# Patient Record
Sex: Male | Born: 1970
Health system: Southern US, Community
[De-identification: ages and names within clinical notes are randomized; demographics above are authoritative.]

## PROBLEM LIST (undated history)

## (undated) ENCOUNTER — Emergency Department (HOSPITAL_COMMUNITY): Discharge status: Absent without leave | Payer: 59

## (undated) DIAGNOSIS — A159 Respiratory tuberculosis unspecified: Secondary | ICD-10-CM

## (undated) DIAGNOSIS — F32A Depression, unspecified: Secondary | ICD-10-CM

## (undated) DIAGNOSIS — Z973 Presence of spectacles and contact lenses: Secondary | ICD-10-CM

## (undated) DIAGNOSIS — D649 Anemia, unspecified: Secondary | ICD-10-CM

## (undated) DIAGNOSIS — M869 Osteomyelitis, unspecified: Secondary | ICD-10-CM

## (undated) DIAGNOSIS — Z5189 Encounter for other specified aftercare: Secondary | ICD-10-CM

## (undated) DIAGNOSIS — B9562 Methicillin resistant Staphylococcus aureus infection as the cause of diseases classified elsewhere: Secondary | ICD-10-CM

## (undated) DIAGNOSIS — K219 Gastro-esophageal reflux disease without esophagitis: Secondary | ICD-10-CM

## (undated) DIAGNOSIS — E119 Type 2 diabetes mellitus without complications: Secondary | ICD-10-CM

## (undated) DIAGNOSIS — F1020 Alcohol dependence, uncomplicated: Secondary | ICD-10-CM

## (undated) DIAGNOSIS — I10 Essential (primary) hypertension: Secondary | ICD-10-CM

## (undated) DIAGNOSIS — F329 Major depressive disorder, single episode, unspecified: Secondary | ICD-10-CM

## (undated) DIAGNOSIS — T7840XA Allergy, unspecified, initial encounter: Secondary | ICD-10-CM

## (undated) DIAGNOSIS — M1990 Unspecified osteoarthritis, unspecified site: Secondary | ICD-10-CM

## (undated) DIAGNOSIS — J189 Pneumonia, unspecified organism: Secondary | ICD-10-CM

## (undated) DIAGNOSIS — F419 Anxiety disorder, unspecified: Secondary | ICD-10-CM

## (undated) DIAGNOSIS — R7881 Bacteremia: Secondary | ICD-10-CM

## (undated) DIAGNOSIS — J45909 Unspecified asthma, uncomplicated: Secondary | ICD-10-CM

## (undated) DIAGNOSIS — J449 Chronic obstructive pulmonary disease, unspecified: Secondary | ICD-10-CM

## (undated) DIAGNOSIS — Z8719 Personal history of other diseases of the digestive system: Secondary | ICD-10-CM

## (undated) DIAGNOSIS — K859 Acute pancreatitis without necrosis or infection, unspecified: Secondary | ICD-10-CM

## (undated) DIAGNOSIS — G709 Myoneural disorder, unspecified: Secondary | ICD-10-CM

## (undated) DIAGNOSIS — F1910 Other psychoactive substance abuse, uncomplicated: Secondary | ICD-10-CM

## (undated) HISTORY — DX: Anemia, unspecified: D64.9

## (undated) HISTORY — DX: Anxiety disorder, unspecified: F41.9

## (undated) HISTORY — DX: Acute pancreatitis without necrosis or infection, unspecified: K85.90

## (undated) HISTORY — PX: COLONOSCOPY: SHX174

## (undated) HISTORY — PX: NASAL SEPTUM SURGERY: SHX37

## (undated) HISTORY — PX: HERNIA REPAIR: SHX51

## (undated) HISTORY — DX: Allergy, unspecified, initial encounter: T78.40XA

## (undated) HISTORY — PX: TOTAL KNEE ARTHROPLASTY: SHX125

## (undated) HISTORY — DX: Alcohol dependence, uncomplicated: F10.20

## (undated) HISTORY — PX: PANCREATIC PSEUDOCYST DRAINAGE: SHX2158

## (undated) HISTORY — DX: Pneumonia, unspecified organism: J18.9

## (undated) HISTORY — DX: Bacteremia: R78.81

## (undated) HISTORY — DX: Encounter for other specified aftercare: Z51.89

## (undated) HISTORY — PX: APPENDECTOMY: SHX54

## (undated) HISTORY — DX: Major depressive disorder, single episode, unspecified: F32.9

## (undated) HISTORY — DX: Chronic obstructive pulmonary disease, unspecified: J44.9

## (undated) HISTORY — DX: Unspecified asthma, uncomplicated: J45.909

## (undated) HISTORY — DX: Methicillin resistant Staphylococcus aureus infection as the cause of diseases classified elsewhere: B95.62

## (undated) HISTORY — DX: Myoneural disorder, unspecified: G70.9

## (undated) HISTORY — PX: ESOPHAGUS SURGERY: SHX626

## (undated) HISTORY — PX: OTHER SURGICAL HISTORY: SHX169

## (undated) HISTORY — DX: Depression, unspecified: F32.A

## (undated) HISTORY — DX: Essential (primary) hypertension: I10

## (undated) HISTORY — DX: Respiratory tuberculosis unspecified: A15.9

## (undated) HISTORY — DX: Other psychoactive substance abuse, uncomplicated: F19.10

## (undated) HISTORY — DX: Type 2 diabetes mellitus without complications: E11.9

---

## 2004-09-14 HISTORY — PX: CHOLECYSTECTOMY: SHX55

## 2004-09-14 HISTORY — PX: SPLENECTOMY: SUR1306

## 2013-11-23 DIAGNOSIS — N499 Inflammatory disorder of unspecified male genital organ: Secondary | ICD-10-CM | POA: Insufficient documentation

## 2014-02-16 DIAGNOSIS — I10 Essential (primary) hypertension: Secondary | ICD-10-CM | POA: Insufficient documentation

## 2014-02-16 DIAGNOSIS — J45909 Unspecified asthma, uncomplicated: Secondary | ICD-10-CM | POA: Insufficient documentation

## 2014-08-14 DIAGNOSIS — IMO0002 Reserved for concepts with insufficient information to code with codable children: Secondary | ICD-10-CM | POA: Insufficient documentation

## 2014-08-14 DIAGNOSIS — E1165 Type 2 diabetes mellitus with hyperglycemia: Secondary | ICD-10-CM | POA: Insufficient documentation

## 2015-05-23 DIAGNOSIS — N529 Male erectile dysfunction, unspecified: Secondary | ICD-10-CM | POA: Insufficient documentation

## 2015-08-05 DIAGNOSIS — Z794 Long term (current) use of insulin: Secondary | ICD-10-CM | POA: Insufficient documentation

## 2015-12-30 DIAGNOSIS — N400 Enlarged prostate without lower urinary tract symptoms: Secondary | ICD-10-CM | POA: Insufficient documentation

## 2015-12-30 DIAGNOSIS — F419 Anxiety disorder, unspecified: Secondary | ICD-10-CM | POA: Insufficient documentation

## 2015-12-30 DIAGNOSIS — G2581 Restless legs syndrome: Secondary | ICD-10-CM | POA: Insufficient documentation

## 2015-12-30 DIAGNOSIS — N493 Fournier gangrene: Secondary | ICD-10-CM | POA: Insufficient documentation

## 2015-12-30 DIAGNOSIS — E1140 Type 2 diabetes mellitus with diabetic neuropathy, unspecified: Secondary | ICD-10-CM | POA: Insufficient documentation

## 2015-12-30 DIAGNOSIS — J449 Chronic obstructive pulmonary disease, unspecified: Secondary | ICD-10-CM | POA: Insufficient documentation

## 2017-12-21 DIAGNOSIS — J449 Chronic obstructive pulmonary disease, unspecified: Secondary | ICD-10-CM | POA: Diagnosis not present

## 2017-12-21 DIAGNOSIS — E1165 Type 2 diabetes mellitus with hyperglycemia: Secondary | ICD-10-CM | POA: Diagnosis not present

## 2017-12-21 DIAGNOSIS — Z794 Long term (current) use of insulin: Secondary | ICD-10-CM | POA: Diagnosis not present

## 2017-12-21 DIAGNOSIS — K861 Other chronic pancreatitis: Secondary | ICD-10-CM | POA: Diagnosis not present

## 2018-01-27 MED FILL — LEVEMIR FLEXTOUCH 100 UNITS: 100 | 30 days supply | Qty: 18 | Fill #0

## 2018-01-27 MED FILL — VENTOLIN HFA 90 MCG INHALER: 108 (90 BAS | 50 days supply | Qty: 18 | Fill #0

## 2018-01-27 MED FILL — traZODone HCL 100 MG TABS: 100 | 30 days supply | Qty: 30 | Fill #0

## 2018-01-27 MED FILL — MONTELUKAST SOD 10 MG TAB: 10 | 30 days supply | Qty: 30 | Fill #0

## 2018-01-27 MED FILL — GABAPENTIN 300 MG CAPSULE: 300 | 90 days supply | Qty: 90 | Fill #0

## 2018-01-27 MED FILL — ADVAIR 250/50 DISKUS: 250-50 | 30 days supply | Qty: 60 | Fill #0

## 2018-01-27 MED FILL — METFORMIN HCL ER 500 MG TAB: 500 | 90 days supply | Qty: 180 | Fill #0

## 2018-02-24 ENCOUNTER — Other Ambulatory Visit: Payer: Self-pay

## 2018-02-24 ENCOUNTER — Ambulatory Visit (INDEPENDENT_AMBULATORY_CARE_PROVIDER_SITE_OTHER): Payer: 59 | Admitting: Family Medicine

## 2018-02-24 ENCOUNTER — Encounter: Payer: Self-pay | Admitting: Family Medicine

## 2018-02-24 VITALS — BP 154/88 | HR 65 | Temp 98.3°F | Wt 283.8 lb

## 2018-02-24 DIAGNOSIS — E1165 Type 2 diabetes mellitus with hyperglycemia: Secondary | ICD-10-CM

## 2018-02-24 DIAGNOSIS — J4520 Mild intermittent asthma, uncomplicated: Secondary | ICD-10-CM | POA: Diagnosis not present

## 2018-02-24 DIAGNOSIS — Z794 Long term (current) use of insulin: Secondary | ICD-10-CM | POA: Diagnosis not present

## 2018-02-24 DIAGNOSIS — D649 Anemia, unspecified: Secondary | ICD-10-CM | POA: Diagnosis not present

## 2018-02-24 DIAGNOSIS — F419 Anxiety disorder, unspecified: Secondary | ICD-10-CM

## 2018-02-24 DIAGNOSIS — F418 Other specified anxiety disorders: Secondary | ICD-10-CM | POA: Insufficient documentation

## 2018-02-24 DIAGNOSIS — F1020 Alcohol dependence, uncomplicated: Secondary | ICD-10-CM | POA: Diagnosis not present

## 2018-02-24 DIAGNOSIS — F17200 Nicotine dependence, unspecified, uncomplicated: Secondary | ICD-10-CM | POA: Diagnosis not present

## 2018-02-24 DIAGNOSIS — K2100 Gastro-esophageal reflux disease with esophagitis, without bleeding: Secondary | ICD-10-CM

## 2018-02-24 DIAGNOSIS — K219 Gastro-esophageal reflux disease without esophagitis: Secondary | ICD-10-CM | POA: Insufficient documentation

## 2018-02-24 DIAGNOSIS — E1140 Type 2 diabetes mellitus with diabetic neuropathy, unspecified: Secondary | ICD-10-CM | POA: Insufficient documentation

## 2018-02-24 DIAGNOSIS — F32A Depression, unspecified: Secondary | ICD-10-CM

## 2018-02-24 DIAGNOSIS — F329 Major depressive disorder, single episode, unspecified: Secondary | ICD-10-CM | POA: Diagnosis not present

## 2018-02-24 MED ORDER — INSULIN DETEMIR 100 UNIT/ML ~~LOC~~ SOLN: 60.0000 [IU] | Freq: Every day | SUBCUTANEOUS | 11 refills | Status: DC

## 2018-02-24 MED ORDER — TRAZODONE HCL 50 MG PO TABS: 100.0000 mg | ORAL_TABLET | Freq: Every evening | ORAL | 3 refills | Status: DC | PRN

## 2018-02-24 MED ORDER — MONTELUKAST SODIUM 10 MG PO TABS: 10.0000 mg | ORAL_TABLET | Freq: Every day | ORAL | 3 refills | Status: DC

## 2018-02-24 MED ORDER — FLUTICASONE-SALMETEROL 250-50 MCG/DOSE IN AEPB: 1.0000 | INHALATION_SPRAY | Freq: Two times a day (BID) | RESPIRATORY_TRACT | 3 refills | Status: DC

## 2018-02-24 MED ORDER — METFORMIN HCL 1000 MG PO TABS: 1000.0000 mg | ORAL_TABLET | Freq: Two times a day (BID) | ORAL | 3 refills | Status: DC

## 2018-02-24 MED ORDER — HUMALOG JUNIOR KWIKPEN 100 UNIT/ML ~~LOC~~ SOPN: 5.0000 [IU] | PEN_INJECTOR | Freq: Three times a day (TID) | SUBCUTANEOUS | 3 refills | Status: DC

## 2018-02-24 MED ORDER — GABAPENTIN 300 MG PO CAPS: 300.0000 mg | ORAL_CAPSULE | Freq: Every day | ORAL | 3 refills | Status: DC

## 2018-02-24 MED FILL — MONTELUKAST SOD 10 MG TAB: 10 | 90 days supply | Qty: 90 | Fill #0

## 2018-02-24 MED FILL — traZODone HCL 50 MG TABS: 50 | 90 days supply | Qty: 180 | Fill #0

## 2018-02-24 MED FILL — ADVAIR 250/50 DISKUS: 250-50 | 90 days supply | Qty: 180 | Fill #0

## 2018-02-24 MED FILL — metFORMIN HCL 1000 MG TABS: 1000 | 90 days supply | Qty: 180 | Fill #0

## 2018-02-24 NOTE — Patient Instructions (Signed)
Thank you for coming in to see [place] today. Please see below to review our plan for today's visit.  1.  I will call you if your lab results are abnormal, otherwise expect to receive results in the mail.  Her medications will be called in and should be available to be picked up from the pharmacy because you. 2.  It is important that you stop drinking as this will worsen your pancreas and could lead to other complications.  We will discuss smoking cessation at a future visit.  You can call 1 800 quit now in the meantime for free resources if you are interested. 3.  I would like to see you again in about 1 month.  Please call the clinic at [phone] if your symptoms worsen or you have any concerns. It was our pleasure to serve you.  Harriet Butte, [place], PGY-2

## 2018-02-24 NOTE — Assessment & Plan Note (Signed)
Chronic.  Currently drinking beer.  No history of DTs.  - Discussed importance of remaining sober given history

## 2018-02-24 NOTE — Progress Notes (Signed)
Subjective   Patient ID: Christopher Fernandez    DOB: Mar 29, 1971, 47 y.o. male   MRN: [id number]  CC: "Establish care"  HPI: Christopher Fernandez is a 47 y.o. male who presents to clinic today for the following:  Diabetes: Patient with long-standing uncontrolled diabetes.  Uncertain what his last A1c was.  Has been on insulin for many years.  Has been taking his Levemir sliding scale along with his Humalog.  Patient does not exercise but tries to avoid excessive carbohydrate intake.  Smoker: Current everyday smoker.  Smokes approximately 1 pack/week.  Has attempted once to quit cold [place] unsuccessfully.  Interested in cessation in the near future.  Denies fevers or chills, cough, shortness of breath, chest pain, unexplained weight loss.  EtOH use disorder: Has extensive history of pancreatic issues with diabetes secondary to alcohol use.  Patient states he currently drinks 5-6 beers every few days.  He denies any history of DTs or need for hospitalization for alcohol withdrawal.  He is not taking any benzodiazepines.  Asthma: Patient currently taking Ventolin along with his Advair Diskus twice daily.  He does not use his insulin as a rescue inhaler.  He denies symptoms of cough, shortness of breath, increased sputum production.  ROS: see HPI for pertinent.  [place]: DM, GERD, asthma, EtOH use disorder, anxiety depression, anemia.  Surgical history pancreatic pseudocyst drainage, splenectomy, cholecystectomy, hernia repair x2, amputation of right great toe, R-TKA x2.  Family history DM, HTN, HLD, CKD, thyroid, EtOH abuse, sickle cell trait, asthma.  Smoking status reviewed. Medications reviewed.  Objective   BP (!) 154/88   Pulse 65   Temp 98.3 F (36.8 C) (Oral)   Wt 283 lb 12.8 oz (128.7 kg)   SpO2 98%  Vitals and nursing note reviewed.  General: well nourished, well developed, NAD with non-toxic appearance HEENT: normocephalic, atraumatic, moist mucous membranes Neck: supple, non-tender without  lymphadenopathy Cardiovascular: regular rate and rhythm without murmurs, rubs, or gallops Lungs: clear to auscultation bilaterally with normal work of breathing Abdomen: soft, non-tender, non-distended, normoactive bowel sounds Skin: warm, dry, no rashes or lesions, cap refill < 2 seconds Extremities: warm and well perfused, normal tone, no edema  Assessment & Plan   Uncontrolled diabetes mellitus with hyperglycemia, with long-term current use of insulin (HCC) Chronic.  History of uncontrol.  Currently on insulin.  Patient is unfortunately taking his basal insulin sliding scale. - Checking A1c, CBC with differential, CMET, TSH, LDL level - Instructed patient to take Levemir 60 units daily and limit Humalog to 5 units 3 times daily - Discussed lifestyle modification  Tobacco use disorder Chronic.  Current everyday smoker.  History of 1 failed attempt. - Given instructions for 1800 quit now - We will consider trial with Chantix at next visit  GERD (gastroesophageal reflux disease) Chronic.  Likely secondary to chronic alcohol use disorder.  Patient to back today.  No PPI on med history. - Discussed importance of avoidance of alcohol use based on history  Asthma, intermittent Chronic.  Controlled.  Patient seems to be taking his Ventolin as a controller. - Given refill for Advair Diskus twice daily and Ventolin as needed - Discussed importance of smoking cessation  Alcohol use disorder, severe, dependence (HCC) Chronic.  Currently drinking beer.  No history of DTs.  - Discussed importance of remaining sober given history  Orders Placed This Encounter  Procedures  . CBC with Differential/Platelet  . CMP14+EGFR  . TSH  . LDL cholesterol, direct  . HgB A1c  Meds ordered this encounter  Medications  . metFORMIN (GLUCOPHAGE) 1000 MG tablet    Sig: Take 1 tablet (1,000 mg total) by mouth 2 (two) times daily with a meal.    Dispense:  180 tablet    Refill:  3  . montelukast  (SINGULAIR) 10 MG tablet    Sig: Take 1 tablet (10 mg total) by mouth at bedtime.    Dispense:  90 tablet    Refill:  3  . gabapentin (NEURONTIN) 300 MG capsule    Sig: Take 1 capsule (300 mg total) by mouth at bedtime.    Dispense:  90 capsule    Refill:  3  . Fluticasone-Salmeterol (ADVAIR DISKUS) 250-50 MCG/DOSE AEPB    Sig: Inhale 1 puff into the lungs 2 (two) times daily.    Dispense:  180 each    Refill:  3  . traZODone (DESYREL) 50 MG tablet    Sig: Take 2 tablets (100 mg total) by mouth at bedtime as needed for sleep.    Dispense:  180 tablet    Refill:  3  . insulin detemir (LEVEMIR) 100 UNIT/ML injection    Sig: Inject 0.6 mLs (60 Units total) into the skin at bedtime.    Dispense:  10 mL    Refill:  11  . insulin lispro (HUMALOG) 100 UNIT/ML KwikPen Junior    Sig: Inject 0.05 mLs (5 Units total) into the skin 3 (three) times daily.    Dispense:  12 pen    Refill:  [place], [employer], PGY-2 02/24/2018, 5:09 PM

## 2018-02-24 NOTE — Assessment & Plan Note (Signed)
Chronic.  Controlled.  Patient seems to be taking his Ventolin as a controller. - Given refill for Advair Diskus twice daily and Ventolin as needed - Discussed importance of smoking cessation

## 2018-02-24 NOTE — Assessment & Plan Note (Signed)
Chronic.  Current everyday smoker.  History of 1 failed attempt. - Given instructions for 1800 quit now - We will consider trial with Chantix at next visit

## 2018-02-24 NOTE — Assessment & Plan Note (Signed)
Chronic.  Likely secondary to chronic alcohol use disorder.  Patient to back today.  No PPI on med history. - Discussed importance of avoidance of alcohol use based on history

## 2018-02-24 NOTE — Assessment & Plan Note (Signed)
Chronic.  History of uncontrol.  Currently on insulin.  Patient is unfortunately taking his basal insulin sliding scale. - Checking A1c, CBC with differential, CMET, TSH, LDL level - Instructed patient to take Levemir 60 units daily and limit Humalog to 5 units 3 times daily - Discussed lifestyle modification

## 2018-02-25 ENCOUNTER — Encounter: Payer: Self-pay | Admitting: Family Medicine

## 2018-02-25 ENCOUNTER — Telehealth: Payer: Self-pay

## 2018-02-25 LAB — CMP14+EGFR
ALT: 37 IU/L (ref 0–44)
AST: 31 IU/L (ref 0–40)
Albumin/Globulin Ratio: 1.6 (ref 1.2–2.2)
Albumin: 4.3 g/dL (ref 3.5–5.5)
Alkaline Phosphatase: 72 IU/L (ref 39–117)
BUN/Creatinine Ratio: 12 (ref 9–20)
BUN: 11 mg/dL (ref 6–24)
Bilirubin Total: 0.5 mg/dL (ref 0.0–1.2)
CO2: 23 mmol/L (ref 20–29)
Calcium: 9.1 mg/dL (ref 8.7–10.2)
Chloride: 100 mmol/L (ref 96–106)
Creatinine, Ser: 0.9 mg/dL (ref 0.76–1.27)
GFR calc Af Amer: 116 mL/min/{1.73_m2} (ref >59)
GFR calc non Af Amer: 100 mL/min/{1.73_m2} (ref >59)
Globulin, Total: 2.7 g/dL (ref 1.5–4.5)
Glucose: 87 mg/dL (ref 65–99)
Potassium: 4.5 mmol/L (ref 3.5–5.2)
Sodium: 138 mmol/L (ref 134–144)
Total Protein: 7 g/dL (ref 6.0–8.5)

## 2018-02-25 LAB — CBC WITH DIFFERENTIAL/PLATELET
Basophils Absolute: 0 10*3/uL (ref 0.0–0.2)
Basos: 0 %
EOS (ABSOLUTE): 0.1 10*3/uL (ref 0.0–0.4)
Eos: 1 %
Hematocrit: 38.2 % (ref 37.5–51.0)
Hemoglobin: 12.6 g/dL — ABNORMAL LOW (ref 13.0–17.7)
Immature Grans (Abs): 0 10*3/uL (ref 0.0–0.1)
Immature Granulocytes: 0 %
Lymphocytes Absolute: 1.8 10*3/uL (ref 0.7–3.1)
Lymphs: 28 %
MCH: 28.9 pg (ref 26.6–33.0)
MCHC: 33 g/dL (ref 31.5–35.7)
MCV: 88 fL (ref 79–97)
Monocytes Absolute: 0.5 10*3/uL (ref 0.1–0.9)
Monocytes: 8 %
Neutrophils Absolute: 4.2 10*3/uL (ref 1.4–7.0)
Neutrophils: 63 %
Platelets: 275 10*3/uL (ref 150–450)
RBC: 4.36 x10E6/uL (ref 4.14–5.80)
RDW: 13.8 % (ref 12.3–15.4)
WBC: 6.7 10*3/uL (ref 3.4–10.8)

## 2018-02-25 LAB — TSH: TSH: 2.13 u[IU]/mL (ref 0.450–4.500)

## 2018-02-25 LAB — LDL CHOLESTEROL, DIRECT: LDL Direct: 55 mg/dL (ref 0–99)

## 2018-02-25 NOTE — Addendum Note (Signed)
Addended by: [place] Pink on: 02/25/2018 12:21 PM   Modules accepted: Orders

## 2018-02-25 NOTE — Telephone Encounter (Signed)
Received fax from [facility] requesting prior authorization of Humalog Junior KwikPen. Clinical questions placed in PCPs box for completion. Danley Danker, RN [facility] RN)

## 2018-02-28 MED FILL — HUMALOG 100 UNITS/ML KWIKPE: 100 | 80 days supply | Qty: 12 | Fill #0

## 2018-02-28 NOTE — Telephone Encounter (Signed)
Pharmacist from [place] calling asking if this RX should have been for a regular Humalog Kwikpen and not the junior?  Call back I [phone]  Danley Danker, RN [facility])

## 2018-02-28 NOTE — Telephone Encounter (Signed)
Covering inbox for Dr. Yisroel Ramming  It appears a regular Claiborne Rigg is appropriate- junior possibly ordered in error. Called pharmacist back and made change to regular kwikpen 5 units TID.  Lucila Maine, DO PGY-2, [facility] 02/28/2018 4:30 PM

## 2018-02-28 NOTE — Telephone Encounter (Signed)
According to [facility] mailbox he is out of the office this week and Vanetta Shawl is covering. PA put in Riccio's mail box and message routed. Danley Danker, RN [facility] RN)

## 2018-03-01 LAB — SPECIMEN STATUS REPORT

## 2018-03-01 LAB — HEMOGLOBIN A1C
Est. average glucose Bld gHb Est-mCnc: 209 mg/dL
Hgb A1c MFr Bld: 8.9 % — ABNORMAL HIGH (ref 4.8–5.6)

## 2018-03-03 ENCOUNTER — Other Ambulatory Visit: Payer: Self-pay

## 2018-03-03 MED ORDER — INSULIN DETEMIR 100 UNIT/ML FLEXPEN: 60.0000 [IU] | PEN_INJECTOR | Freq: Every day | SUBCUTANEOUS | 11 refills | Status: DC

## 2018-03-23 MED FILL — LEVEMIR FLEXTOUCH 100 UNITS: 100 | 25 days supply | Qty: 15 | Fill #0

## 2018-03-29 ENCOUNTER — Ambulatory Visit (INDEPENDENT_AMBULATORY_CARE_PROVIDER_SITE_OTHER): Payer: 59 | Admitting: Family Medicine

## 2018-03-29 ENCOUNTER — Encounter: Payer: Self-pay | Admitting: Family Medicine

## 2018-03-29 ENCOUNTER — Other Ambulatory Visit: Payer: Self-pay

## 2018-03-29 VITALS — BP 142/98 | HR 74 | Temp 98.6°F | Ht 76.0 in | Wt 284.0 lb

## 2018-03-29 DIAGNOSIS — F17200 Nicotine dependence, unspecified, uncomplicated: Secondary | ICD-10-CM | POA: Diagnosis not present

## 2018-03-29 DIAGNOSIS — J4520 Mild intermittent asthma, uncomplicated: Secondary | ICD-10-CM

## 2018-03-29 DIAGNOSIS — Z794 Long term (current) use of insulin: Secondary | ICD-10-CM

## 2018-03-29 DIAGNOSIS — E1165 Type 2 diabetes mellitus with hyperglycemia: Secondary | ICD-10-CM

## 2018-03-29 DIAGNOSIS — D649 Anemia, unspecified: Secondary | ICD-10-CM

## 2018-03-29 DIAGNOSIS — F1020 Alcohol dependence, uncomplicated: Secondary | ICD-10-CM

## 2018-03-29 DIAGNOSIS — F419 Anxiety disorder, unspecified: Secondary | ICD-10-CM

## 2018-03-29 DIAGNOSIS — F32A Depression, unspecified: Secondary | ICD-10-CM

## 2018-03-29 DIAGNOSIS — F329 Major depressive disorder, single episode, unspecified: Secondary | ICD-10-CM

## 2018-03-29 MED ORDER — FREESTYLE LANCETS MISC: 12 refills | Status: AC

## 2018-03-29 MED ORDER — GLUCOSE BLOOD VI STRP: ORAL_STRIP | 12 refills | Status: DC

## 2018-03-29 MED ORDER — CITALOPRAM HYDROBROMIDE 20 MG PO TABS: 20.0000 mg | ORAL_TABLET | Freq: Every day | ORAL | 3 refills | Status: DC

## 2018-03-29 MED ORDER — FLUTICASONE PROPIONATE (INHAL) 50 MCG/BLIST IN AEPB: 1.0000 | INHALATION_SPRAY | Freq: Two times a day (BID) | RESPIRATORY_TRACT | 12 refills | Status: DC

## 2018-03-29 MED FILL — FREESTYLE LITE TEST STRIP: 30 days supply | Qty: 100 | Fill #0

## 2018-03-29 MED FILL — FREESTYLE LANCETS: 30 days supply | Qty: 100 | Fill #0

## 2018-03-29 MED FILL — CITALOPRAM HBR 20 MG TABLET: 20 | 90 days supply | Qty: 90 | Fill #0

## 2018-03-29 NOTE — Assessment & Plan Note (Addendum)
Chronic.  Remains in relapse.  Patient motivated to remain sober.  Parotid swelling likely due to alcohol use.  No signs of liver failure.  Recent liver function studies WNL.  Patient did endorse history of need for liver transplant but appeared to transplant several years ago.  He does have chronic issues with pancreatitis in the past. - Advised to consider behavioral therapy and will consider pharmacotherapy in the future - Ambulatory referral to GI to evaluate chronic use of pancreas enzymes and long-term history of chronic pancreatitis and evaluation for liver disease

## 2018-03-29 NOTE — Assessment & Plan Note (Signed)
Chronic.  Depression controlled with Celexa.  Does endorse some anxiety.  Offered to increase Celexa but patient would like to wait until next visit. - RTC 1 month - Consider increasing blocks to 30 units daily

## 2018-03-29 NOTE — Assessment & Plan Note (Signed)
Incidental finding.  Likely chronic.  Appears to be normocytic.  No recent history of bleeding.  Patient is not on blood thinners NSAID use.  Will need to rule out GI source.  Could be related to chronic alcohol use disorder. - Ambulatory referral to GI - Checking TIBC and iron, ferritin

## 2018-03-29 NOTE — Progress Notes (Signed)
Subjective   Patient ID: Christopher Fernandez    DOB: April 30, 1971, 47 y.o. male   MRN: [id number]  CC: "Follow-up"  HPI: Christopher Fernandez is a 47 y.o. male who presents to clinic today for the following:  Alcohol use disorder: Patient has a long history of alcohol use disorder.  His preferred drink is mixed drinks such as vodka.  He endorses 2 episodes of binge drinking over the last month, the first with 4 mixed drinks, and the second with 6 mixed drinks.  She states loneliness seems to be his weakness.  He lives at home with his wife who does not drink.  He denies histories of DTs.  He is motivated to become sober and is open to the idea of medical therapy but would like to wait on behavioral therapy.  Depression and anxiety: Patient with history of Tylenol overdose in his early 63s.  No recent episodes of suicidal or homicidal ideation.  He endorses controlled symptoms of depression with use of Celexa 20 daily.  He does occasionally have some episodes of anxiety.  Again, he is not interested in behavioral therapy.  Anemia: Patient found to have hemoglobin of 12.6 one month ago during routine checkup.  He endorses long history of anemia and has required blood transfusions in the past.  He denies hematuria, rectal bleeding, melena or hematochezia.  Patient also denies symptoms of chest pain, shortness of breath, syncope or fatigue.  He does endorse receiving a colonoscopy but cannot recall how long ago.  Asthma: Patient currently on Advair Diskus.  He is requesting a refill of this medication he denies recent episodes of asthma exacerbations, shortness of breath or wheeze.  Diabetes: Patient endorsing adherence to recommendation to take 60 units of Levemir daily along with 5 units of Humalog with each meal.  Diabetes is uncontrolled based on A1c 1 month ago at 8.9.  Patient requesting refills of lancets and testing strips.  Tobacco use disorder: Patient is not interested in cessation today.  He has  contemplated pharmacotherapy for assistance.  He currently smokes 1 pack/week and has tried quitting cold turkey's before without success.  He denies redness of breath, fevers or chills, cough, chest pain, or unexplained weight gain.  ROS: see HPI for pertinent.  [place]: DM, GERD, asthma, EtOH use disorder, anxiety depression, anemia.  Surgical history pancreatic pseudocyst drainage, splenectomy, cholecystectomy, hernia repair x2, amputation of right great toe, R-TKA x2.  Family history DM, HTN, HLD, CKD, thyroid, EtOH abuse, sickle cell trait, asthma.  Smoking status reviewed. Medications reviewed.  Objective   BP (!) 142/98   Pulse 74   Temp 98.6 F (37 C) (Oral)   Ht 6\' 4"  (1.93 m)   Wt 284 lb (128.8 kg)   SpO2 98%   BMI 34.57 kg/m  Vitals and nursing note reviewed.  General: well nourished, well developed, NAD with non-toxic appearance HEENT: normocephalic, atraumatic, moist mucous membranes, mild edema at proximal jaw bilaterally without tenderness Neck: supple, non-tender without lymphadenopathy Cardiovascular: regular rate and rhythm without murmurs, rubs, or gallops Lungs: clear to auscultation bilaterally with normal work of breathing Abdomen: soft, non-tender, non-distended, normoactive bowel sounds Skin: warm, dry, no rashes or lesions, cap refill < 2 seconds Extremities: warm and well perfused, normal tone, no edema Psych: euthymic mood, congruent affect  Assessment & Plan   Asthma, intermittent Chronic.  Controlled on Advair Diskus.  Current everyday smoker.  No signs of acute exacerbation today. - Advised smoking cessation - Given refill for Advair  Diskus, 1 puff twice daily  Alcohol use disorder, severe, dependence (HCC) Chronic.  Remains in relapse.  Patient motivated to remain sober.  Parotid swelling likely due to alcohol use.  No signs of liver failure.  Recent liver function studies WNL.  Patient did endorse history of need for liver transplant but appeared to  transplant several years ago.  He does have chronic issues with pancreatitis in the past. - Advised to consider behavioral therapy and will consider pharmacotherapy in the future - Ambulatory referral to GI to evaluate chronic use of pancreas enzymes and long-term history of chronic pancreatitis and evaluation for liver disease  Uncontrolled diabetes mellitus with hyperglycemia, with long-term current use of insulin (HCC) Chronic.  Uncontrolled.  Currently on insulin. - Continue Levemir 60 units daily and Humalog 5 units with each meal - Given refill for lancets and testing strips - Next A1c due 05/27/2018  Tobacco use disorder Chronic.  Current everyday smoker.  Patient not a good candidate for cessation today.  Appears to and contemplating phase. - Discussed importance of smoking cessation  Anemia Incidental finding.  Likely chronic.  Appears to be normocytic.  No recent history of bleeding.  Patient is not on blood thinners NSAID use.  Will need to rule out GI source.  Could be related to chronic alcohol use disorder. - Ambulatory referral to GI - Checking TIBC and iron, ferritin  Anxiety and depression Chronic.  Depression controlled with Celexa.  Does endorse some anxiety.  Offered to increase Celexa but patient would like to wait until next visit. - RTC 1 month - Consider increasing blocks to 30 units daily  Orders Placed This Encounter  Procedures  . Iron and TIBC  . Ferritin  . Ambulatory referral to Gastroenterology    Referral Priority:   Routine    Referral Type:   Consultation    Referral Reason:   Specialty Services Required    Number of Visits Requested:   1   Meds ordered this encounter  Medications  . fluticasone (FLOVENT DISKUS) 50 MCG/BLIST diskus inhaler    Sig: Inhale 1 puff into the lungs 2 (two) times daily.    Dispense:  1 Inhaler    Refill:  12  . citalopram (CELEXA) 20 MG tablet    Sig: Take 1 tablet (20 mg total) by mouth daily.    Dispense:  90  tablet    Refill:  3  . Lancets (FREESTYLE) lancets    Sig: Use as instructed    Dispense:  100 each    Refill:  12  . glucose blood (FREESTYLE LITE) test strip    Sig: Use as instructed    Dispense:  100 each    Refill:  12    Harriet Butte, DO [place], PGY-3 03/29/2018, 11:53 AM

## 2018-03-29 NOTE — Patient Instructions (Addendum)
Thank you for coming in to see [place] today. Please see below to review our plan for today's visit.  1.  I have placed a referral for your Flovent regarding your asthma.  Take as prescribed. 2.  Continue taking the Levemir 60 units daily and Humalog 5 units with each meal.  I have provided you lancets and testing strips.  Be sure to check your sugar prior to breakfast and 2 hours after meals and record these and bring them to your next diabetes appointment in 2 months. 3.  I have placed a referral for you to see a gastroenterologist to follow-up with your Creon and other pancreatic issues.  Expect to receive a phone call in the next couple of weeks to schedule the appointment. 4.  We will discuss your iron studies at your next visit in approximately 1 month.  Please call the clinic at [phone] if your symptoms worsen or you have any concerns. It was our pleasure to serve you.  Harriet Butte, [place], PGY-3

## 2018-03-29 NOTE — Assessment & Plan Note (Signed)
Chronic.  Current everyday smoker.  Patient not a good candidate for cessation today.  Appears to and contemplating phase. - Discussed importance of smoking cessation

## 2018-03-29 NOTE — Assessment & Plan Note (Signed)
Chronic.  Controlled on Advair Diskus.  Current everyday smoker.  No signs of acute exacerbation today. - Advised smoking cessation - Given refill for Advair Diskus, 1 puff twice daily

## 2018-03-29 NOTE — Assessment & Plan Note (Signed)
Chronic.  Uncontrolled.  Currently on insulin. - Continue Levemir 60 units daily and Humalog 5 units with each meal - Given refill for lancets and testing strips - Next A1c due 05/27/2018

## 2018-03-30 ENCOUNTER — Encounter: Payer: Self-pay | Admitting: Family Medicine

## 2018-03-30 LAB — IRON AND TIBC
Iron Saturation: 24 % (ref 15–55)
Iron: 90 ug/dL (ref 38–169)
Total Iron Binding Capacity: 378 ug/dL (ref 250–450)
UIBC: 288 ug/dL (ref 111–343)

## 2018-03-30 LAB — FERRITIN: Ferritin: 125 ng/mL (ref 30–400)

## 2018-03-31 ENCOUNTER — Encounter: Payer: Self-pay | Admitting: Gastroenterology

## 2018-05-02 NOTE — Progress Notes (Signed)
Subjective   Patient ID: Christopher Fernandez    DOB: [birth year], 47 y.o. male   MRN: [id number]  CC: "depression follow-up"  HPI: Christopher Fernandez is a 47 y.o. male who presents to clinic today for the following:  Depression with anxiety: Patient here today for follow-up long-standing depression and anxiety.  He does have a history of Tylenol overdose in his early 78s without suicidal or homicidal ideation over the last couple years.  He does report improvement of symptoms with chronic use of Celexa 20 mg daily with occasional symptoms of anxiety.  Patient reports taking Celexa for approximately 12 years.  He has a history of using Prozac and Paxil but was not adherent.  His anxiety tends to occur more frequently in the evening and occurs on average 2 days/week.  Patient denies symptoms of shortness of breath, chest pain, palpitations, diaphoresis, sensation of impending doom.  He feels that his anxiety asked him just as bad as his depression.  He does report increased stress over the last few weeks regarding the death of 2 family members.  He is not interested in seeing behavioral therapy at present.  He is moving along with his wife to the [place] area from [place] in the next few weeks and plans on establishing at a church.  Smoking: Patient is a current everyday smoker with approximately 1 packs/day.  He has a history of several times to quit.  He has no interest in quitting today.  Denies fevers or chills, cough, unexplained weight loss, shortness of breath, chest pain.  Left knee pain: Onset 1 year ago affecting lateral surface of left knee.  Patient denies history of trauma.  He has no erythema or swelling on the left knee.  He has tried Tylenol and ibuprofen without much relief.  Pain occurs throughout the day and there is nothing that alleviates his symptoms.  He is interested in getting x-ray today.  ROS: see HPI for pertinent.  Christopher Fernandez: DM, GERD, asthma, EtOH use disorder, anxiety depression,  anemia.  Surgical history pancreatic pseudocyst drainage, splenectomy, cholecystectomy, hernia repair x2, amputation of right great toe, R-TKA x2.  Family history DM, HTN, HLD, CKD, thyroid, EtOH abuse, sickle cell trait, asthma.  Smoking status reviewed. Medications reviewed.  Objective   BP 134/82   Pulse 75   Temp 98.6 F (37 C) (Oral)   Ht 6\' 4"  (1.93 m)   Wt 283 lb 6.4 oz (128.5 kg)   SpO2 99%   BMI 34.50 kg/m  Vitals and nursing note reviewed.  General: well nourished, well developed, NAD with non-toxic appearance HEENT: normocephalic, atraumatic, moist mucous membranes Cardiovascular: regular rate and rhythm without murmurs, rubs, or gallops Lungs: clear to auscultation bilaterally with normal work of breathing Skin: warm, dry, no rashes or lesions, cap refill < 2 seconds Extremities: warm and well perfused, normal tone, no edema, 5/5 motor strength on lower extremities bilaterally, negative McMurray's and negative Lachman test, negative valgus and varus stress test, normal gait, sensation intact lower extremities bilaterally Psych: euthymic mood, congruent affect  Assessment & Plan   Anxiety associated with depression Chronic.  PHQ-9 score 10, somewhat difficult.  Gad-7 score 7, somewhat difficult.  Has history of MDD well-controlled with Celexa.  Does have weekly anxious symptoms.  Good support system.  Patient does not appear to be threat to self or others.   - Increase Celexa to 30 mg daily - Follow-up in 2 months  Chronic pain of left knee Chronic.  No signs of  meniscal or ligamentous injury.  Patient without history of trauma.  Suspicious for arthritic changes. - Standing AP knee bilateral radiographs  Tobacco use disorder Chronic.  Current everyday smoker. Poor candidate for cessation today. - Discussed importance of smoking cessation  Orders Placed This Encounter  Procedures  . DG Knee Bilateral Standing AP    Standing Status:   Future    Standing Expiration  Date:   10/30/2018    Order Specific Question:   Reason for Exam (SYMPTOM  OR DIAGNOSIS REQUIRED)    Answer:   left knee pain    Order Specific Question:   Preferred imaging location?    Answer:   [facility]    Order Specific Question:   Call Results- Best Contact Number?    Answer:   [phone]   No orders of the defined types were placed in this encounter.   Harriet Butte, [place], PGY-3 05/03/2018, 9:12 PM

## 2018-05-03 ENCOUNTER — Other Ambulatory Visit: Payer: Self-pay

## 2018-05-03 ENCOUNTER — Encounter: Payer: Self-pay | Admitting: Family Medicine

## 2018-05-03 ENCOUNTER — Ambulatory Visit (INDEPENDENT_AMBULATORY_CARE_PROVIDER_SITE_OTHER): Payer: 59 | Admitting: Family Medicine

## 2018-05-03 VITALS — BP 134/82 | HR 75 | Temp 98.6°F | Ht 76.0 in | Wt 283.4 lb

## 2018-05-03 DIAGNOSIS — M25562 Pain in left knee: Secondary | ICD-10-CM

## 2018-05-03 DIAGNOSIS — F418 Other specified anxiety disorders: Secondary | ICD-10-CM | POA: Diagnosis not present

## 2018-05-03 DIAGNOSIS — F17200 Nicotine dependence, unspecified, uncomplicated: Secondary | ICD-10-CM | POA: Diagnosis not present

## 2018-05-03 DIAGNOSIS — G8929 Other chronic pain: Secondary | ICD-10-CM | POA: Insufficient documentation

## 2018-05-03 MED FILL — FREESTYLE LITE TEST STRIP: 30 days supply | Qty: 100 | Fill #1

## 2018-05-03 MED FILL — FREESTYLE LANCETS: 30 days supply | Qty: 100 | Fill #1

## 2018-05-03 MED FILL — GABAPENTIN 300 MG CAPSULE: 300 | 60 days supply | Qty: 60 | Fill #1

## 2018-05-03 MED FILL — LEVEMIR FLEXTOUCH 100 UNITS: 100 | 25 days supply | Qty: 15 | Fill #1

## 2018-05-03 NOTE — Assessment & Plan Note (Signed)
Chronic.  Current everyday smoker. Poor candidate for cessation today. - Discussed importance of smoking cessation

## 2018-05-03 NOTE — Assessment & Plan Note (Signed)
Chronic.  No signs of meniscal or ligamentous injury.  Patient without history of trauma.  Suspicious for arthritic changes. - Standing AP knee bilateral radiographs

## 2018-05-03 NOTE — Patient Instructions (Signed)
Thank you for coming in to see [place] today. Please see below to review our plan for today's visit.  1.  Increase her Celexa to 30 mg daily.  I think it would be helpful for you and your wife to get involved in a church when you relocate in the next few weeks.  Having a good support group is very important. 2.  Please let me know when you are ready to stop smoking. 3.  As for your knee, we will check an x-ray to see if there are any signs of arthritic changes.  You can take ibuprofen 800 mg every 8 hours as needed for pain.  It is also important that you are exercising regularly.  I would aim to do moderate intensity exercise a few times throughout the week.  Try doing this along with your wife to formulate habits. 4.  I will see you again in late September or October to check on your diabetes.  Please call the clinic at [phone] if your symptoms worsen or you have any concerns. It was our pleasure to serve you.  Harriet Butte, [place], PGY-3

## 2018-05-03 NOTE — Assessment & Plan Note (Signed)
Chronic.  PHQ-9 score 10, somewhat difficult.  Gad-7 score 7, somewhat difficult.  Has history of MDD well-controlled with Celexa.  Does have weekly anxious symptoms.  Good support system.  Patient does not appear to be threat to self or others.   - Increase Celexa to 30 mg daily - Follow-up in 2 months

## 2018-05-20 ENCOUNTER — Ambulatory Visit (HOSPITAL_COMMUNITY)
Admission: RE | Admit: 2018-05-20 | Discharge: 2018-05-20 | Disposition: A | Discharge status: Home / Self Care | Payer: 59 | Source: Ambulatory Visit | Attending: Family Medicine | Admitting: Family Medicine

## 2018-05-20 DIAGNOSIS — M170 Bilateral primary osteoarthritis of knee: Secondary | ICD-10-CM | POA: Diagnosis not present

## 2018-05-20 DIAGNOSIS — M1712 Unilateral primary osteoarthritis, left knee: Secondary | ICD-10-CM | POA: Diagnosis not present

## 2018-05-20 DIAGNOSIS — M25562 Pain in left knee: Secondary | ICD-10-CM | POA: Insufficient documentation

## 2018-05-20 DIAGNOSIS — G8929 Other chronic pain: Secondary | ICD-10-CM | POA: Diagnosis not present

## 2018-05-23 ENCOUNTER — Encounter: Payer: Self-pay | Admitting: Family Medicine

## 2018-05-26 ENCOUNTER — Ambulatory Visit (INDEPENDENT_AMBULATORY_CARE_PROVIDER_SITE_OTHER): Payer: 59 | Admitting: Gastroenterology

## 2018-05-26 ENCOUNTER — Encounter: Payer: Self-pay | Admitting: Gastroenterology

## 2018-05-26 VITALS — BP 110/78 | HR 104 | Ht 73.75 in | Wt 281.2 lb

## 2018-05-26 DIAGNOSIS — K529 Noninfective gastroenteritis and colitis, unspecified: Secondary | ICD-10-CM | POA: Diagnosis not present

## 2018-05-26 DIAGNOSIS — Z794 Long term (current) use of insulin: Secondary | ICD-10-CM

## 2018-05-26 DIAGNOSIS — E119 Type 2 diabetes mellitus without complications: Secondary | ICD-10-CM

## 2018-05-26 DIAGNOSIS — F1020 Alcohol dependence, uncomplicated: Secondary | ICD-10-CM | POA: Diagnosis not present

## 2018-05-26 DIAGNOSIS — K8681 Exocrine pancreatic insufficiency: Secondary | ICD-10-CM

## 2018-05-26 MED ORDER — PANCRELIPASE (LIP-PROT-AMYL) 36000-114000 UNITS PO CPEP: 72000.0000 [IU] | ORAL_CAPSULE | Freq: Three times a day (TID) | ORAL | 1 refills | Status: DC

## 2018-05-26 MED FILL — traZODone HCL 50 MG TABS: 50 | 90 days supply | Qty: 180 | Fill #1

## 2018-05-26 MED FILL — CREON DR 36,000 UNITS CAP: 36000 | 30 days supply | Qty: 210 | Fill #0

## 2018-05-26 NOTE — Patient Instructions (Signed)
If you are age 48 or older, your body mass index should be between 23-30. Your Body mass index is 36.36 kg/m. If this is out of the aforementioned range listed, please consider follow up with your Primary Care Provider.  If you are age 1 or younger, your body mass index should be between 19-25. Your Body mass index is 36.36 kg/m. If this is out of the aformentioned range listed, please consider follow up with your Primary Care Provider.   Follow up in 6 weeks.   It was a pleasure to see you today!  Dr. Loletha Carrow

## 2018-05-26 NOTE — Progress Notes (Signed)
[facility] Consult Note:  History: Christopher Fernandez 05/26/2018  Referring physician: [place] Bing, DO  Reason for consult/chief complaint: Pancreatitis and Diarrhea (not a solid BM in a year)   Subjective  HPI:  This is a pleasant 47 year old man who recently moved to the area and has a history of chronic pancreatitis with ongoing alcohol abuse. Primary care note from 03/29/2018 reviewed, outlining patient's history of binge alcohol use, tobacco use, history of depression and suicide attempt in his 36s as well as insulin requiring diabetes.  Patient reports having been on Creon in the past, recalls the dose is being 15,000 unit capsules.  He was not getting much improvement, so he was told to take 2 capsules.  Even then, he continued to have loose and oily stools several times a day, mostly in the early morning.  He denies abdominal pain, nausea, vomiting, anorexia or weight loss. He continues to drink regularly, but less so over the last few months. He reports a history of pancreatic pseudocyst surgery, cholecystectomy, splenectomy.  None of those records are available today, having been done in [place] , [place].  ROS:  Review of Systems  Constitutional: Negative for appetite change and unexpected weight change.  HENT: Negative for mouth sores and voice change.   Eyes: Negative for pain and redness.  Respiratory: Negative for cough and shortness of breath.   Cardiovascular: Negative for chest pain and palpitations.  Genitourinary: Negative for dysuria and hematuria.  Musculoskeletal: Negative for arthralgias and myalgias.  Skin: Negative for pallor and rash.  Allergic/Immunologic: Positive for environmental allergies.  Neurological: Negative for weakness and headaches.       Sleep disturbance  Hematological: Negative for adenopathy.  Psychiatric/Behavioral: Positive for dysphoric mood.     Past Medical History: Past Medical History:    Diagnosis Date  . Alcoholism ([place])   . Anemia   . Anxiety   . Asthma   . COPD (chronic obstructive pulmonary disease) ([place])   . Depression   . Diabetes ([place])   . HTN (hypertension)   . Pancreatitis   . Pneumonia      Past Surgical History: Past Surgical History:  Procedure Laterality Date  . 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 APPENDECTOMY    . CHOLECYSTECTOMY  2006   with gallbladder and spleen  . HERNIA REPAIR  2008, 2010   hiatal hernia and 1 additional  . PANCREATIC PSEUDOCYST DRAINAGE    . SPLENECTOMY  2006  . TOTAL KNEE ARTHROPLASTY Right 1982, 1984     Family History: Family History  Problem Relation Age of Onset  . Diabetes Mother   . Hypertension Mother   . Hyperlipidemia Mother   . Kidney disease Mother   . Thyroid disease Mother   . Breast cancer Mother        mets  . Lung cancer Mother   . Diabetes Father   . Alcohol abuse Sister   . Sickle cell trait Sister   . Diabetes Brother   . Asthma Brother   . Lung cancer Maternal Grandmother   . Kidney disease Maternal Grandmother   . Liver cancer Maternal Uncle        x 4-5  . Kidney disease Maternal Uncle   . Kidney disease Paternal Uncle     Social History: Social History   Socioeconomic History  . Marital status: Married    Spouse name: Not on file  . Number of children: 1  .  Years of education: Not on file  . Highest education level: Not on file  Occupational History  . Not on file  Social Needs  . Financial resource strain: Not on file  . Food insecurity:    Worry: Not on file    Inability: Not on file  . Transportation needs:    Medical: Not on file    Non-medical: Not on file  Tobacco Use  . Smoking status: Current Every Day Smoker    Packs/day: 0.50  . Smokeless tobacco: Never Used  Substance and Sexual Activity  . Alcohol use: Yes    Comment: 2-3 per day  . Drug use: Not on file  . Sexual activity: Not on file  Lifestyle  . Physical activity:    Days  per week: Not on file    Minutes per session: Not on file  . Stress: Not on file  Relationships  . Social connections:    Talks on phone: Not on file    Gets together: Not on file    Attends religious service: Not on file    Active member of club or organization: Not on file    Attends meetings of clubs or organizations: Not on file    Relationship status: Not on file  Other Topics Concern  . Not on file  Social History Narrative  . Not on file    Allergies: Allergies  Allergen Reactions  . Morphine And Related     Cant take because of pancreatitis    Outpatient Meds: Current Outpatient Medications  Medication Sig Dispense Refill  . citalopram (CELEXA) 20 MG tablet Take 1 tablet (20 mg total) by mouth daily. 90 tablet 3  . fluticasone (FLOVENT DISKUS) 50 MCG/BLIST diskus inhaler Inhale 1 puff into the lungs 2 (two) times daily. 1 Inhaler 12  . Fluticasone-Salmeterol (ADVAIR DISKUS) 250-50 MCG/DOSE AEPB Inhale 1 puff into the lungs 2 (two) times daily. 180 each 3  . gabapentin (NEURONTIN) 300 MG capsule Take 1 capsule (300 mg total) by mouth at bedtime. 90 capsule 3  . glucose blood (FREESTYLE LITE) test strip Use as instructed 100 each 12  . Insulin Detemir (LEVEMIR) 100 UNIT/ML Pen Inject 60 Units into the skin daily at 10 pm. 15 mL 11  . insulin lispro (HUMALOG) 100 UNIT/ML KwikPen Junior Inject 0.05 mLs (5 Units total) into the skin 3 (three) times daily. 12 pen 3  . Lancets (FREESTYLE) lancets Use as instructed 100 each 12  . metFORMIN (GLUCOPHAGE) 1000 MG tablet Take 1 tablet (1,000 mg total) by mouth 2 (two) times daily with a meal. 180 tablet 3  . montelukast (SINGULAIR) 10 MG tablet Take 1 tablet (10 mg total) by mouth at bedtime. 90 tablet 3  . traZODone (DESYREL) 50 MG tablet Take 2 tablets (100 mg total) by mouth at bedtime as needed for sleep. 180 tablet 3  . lipase/protease/amylase (CREON) 36000 UNITS CPEP capsule Take 2 capsules (72,000 Units total) by mouth 3  (three) times daily with meals. Two capsules with a meal, one with a snack 270 capsule 1   No current facility-administered medications for this visit.       ___________________________________________________________________ Objective   Exam:  BP 110/78 (BP Location: Left Arm, Patient Position: Sitting, Cuff Size: Normal)   Pulse (!) 104   Ht 6' 1.75" (1.873 m) Comment: height measured without shoes  Wt 281 lb 4 oz (127.6 kg)   BMI 36.36 kg/m    General: this is a(n) well-appearing man, pleasant  in conversation  Eyes: sclera anicteric, no redness  ENT: oral mucosa moist without lesions, no cervical or supraclavicular lymphadenopathy, good dentition  CV: RRR without murmur, S1/S2, no JVD, no peripheral edema  Resp: clear to auscultation bilaterally, normal RR and effort noted  GI: soft, no tenderness, with active bowel sounds. No guarding or palpable organomegaly noted.  Long midline scar from lower sternum to umbilicus  Skin; warm and dry, no rash or jaundice noted  Neuro: awake, alert and oriented x 3. Normal gross motor function and fluent speech  Labs:  CMP Latest Ref Rng & Units 02/24/2018  Glucose 65 - 99 mg/dL 87  BUN 6 - 24 mg/dL 11  Creatinine 0.76 - 1.27 mg/dL 0.90  Sodium 134 - 144 mmol/L 138  Potassium 3.5 - 5.2 mmol/L 4.5  Chloride 96 - 106 mmol/L 100  CO2 20 - 29 mmol/L 23  Calcium 8.7 - 10.2 mg/dL 9.1  Total Protein 6.0 - 8.5 g/dL 7.0  Total Bilirubin 0.0 - 1.2 mg/dL 0.5  Alkaline Phos 39 - 117 IU/L 72  AST 0 - 40 IU/L 31  ALT 0 - 44 IU/L 37   CBC Latest Ref Rng & Units 02/24/2018  WBC 3.4 - 10.8 x10E3/uL 6.7  Hemoglobin 13.0 - 17.7 g/dL 12.6(L)  Hematocrit 37.5 - 51.0 % 38.2  Platelets 150 - 450 x10E3/uL 275   Hemoglobin A1c 8.9 and TSH normal on 02/24/2018  Radiologic Studies:  None for review  Assessment: Encounter Diagnoses  Name Primary?  [name] Exocrine pancreatic insufficiency Yes  . Chronic diarrhea   . Insulin-requiring or  dependent type II diabetes mellitus ([place])   . Alcohol use disorder, severe, dependence ([place])    He has chronic diarrhea and steatorrhea from exocrine pancreatic insufficiency and what seems to be insufficient dosing of pancreatic enzymes. Less likely considerations are gluten intolerance, bile acid diarrhea, bacterial overgrowth, microscopic colitis, IBD. He had concerned about his risk for pancreatic cancer.  He has no upper abdominal pain, elevated LFTs or weight loss.    Plan:  Creon 36,000 unit capsules, 2 with meals and one with a snack. Return to see me in about 6 weeks.  If persistent symptoms, probable trial of cholestyramine (would be challenging the dosing restrictions and his other medicines), celiac sprue antibodies, possible colonoscopy.  Counseled to quit alcohol use.  Optimal control glucose  Thank you for the courtesy of this consult.  Please call me with any questions or concerns.  Nelida Meuse III  CC: [place] Bing, DO

## 2018-06-13 MED FILL — FREESTYLE LITE TEST STRIP: 30 days supply | Qty: 100 | Fill #2

## 2018-06-14 ENCOUNTER — Encounter: Payer: Self-pay | Admitting: Family Medicine

## 2018-06-14 ENCOUNTER — Ambulatory Visit (INDEPENDENT_AMBULATORY_CARE_PROVIDER_SITE_OTHER): Payer: 59 | Admitting: Family Medicine

## 2018-06-14 VITALS — BP 130/75 | HR 75 | Temp 98.5°F | Ht 76.0 in | Wt 279.4 lb

## 2018-06-14 DIAGNOSIS — J4520 Mild intermittent asthma, uncomplicated: Secondary | ICD-10-CM | POA: Diagnosis not present

## 2018-06-14 DIAGNOSIS — F418 Other specified anxiety disorders: Secondary | ICD-10-CM

## 2018-06-14 DIAGNOSIS — Z794 Long term (current) use of insulin: Secondary | ICD-10-CM | POA: Diagnosis not present

## 2018-06-14 DIAGNOSIS — E1165 Type 2 diabetes mellitus with hyperglycemia: Secondary | ICD-10-CM | POA: Diagnosis not present

## 2018-06-14 DIAGNOSIS — K8681 Exocrine pancreatic insufficiency: Secondary | ICD-10-CM | POA: Insufficient documentation

## 2018-06-14 LAB — POCT GLYCOSYLATED HEMOGLOBIN (HGB A1C): HbA1c, POC (controlled diabetic range): 7.8 % — AB (ref 0.0–7.0)

## 2018-06-14 MED ORDER — SITAGLIPTIN PHOSPHATE 100 MG PO TABS: 100.0000 mg | ORAL_TABLET | Freq: Every day | ORAL | 3 refills | Status: DC

## 2018-06-14 MED ORDER — ALBUTEROL SULFATE HFA 108 (90 BASE) MCG/ACT IN AERS: 2.0000 | INHALATION_SPRAY | RESPIRATORY_TRACT | 2 refills | Status: DC | PRN

## 2018-06-14 MED FILL — VENTOLIN HFA 90 MCG INHALER: 108 (90 BAS | 17 days supply | Qty: 36 | Fill #0

## 2018-06-14 MED FILL — JANUVIA 100 MG TABLET: 100 | 30 days supply | Qty: 30 | Fill #0

## 2018-06-14 NOTE — Progress Notes (Signed)
Subjective   Patient ID: Christopher Fernandez    DOB: 01/04/1971, 47 y.o. male   MRN: [id number]  CC: "Diabetes"  HPI: Christopher Fernandez is a 47 y.o. male who presents to clinic today for the following:  Diabetes: Patient has a long-standing history of diabetes poorly controlled with his most recent A1c recording 8.9 approximately 3 months ago.  He continues taking his metformin along with 60 units of Levemir daily.  Average CBG fasting at 120-140, postprandial averaging 160-220.  No lows.  Patient reports good understanding of how to treat hypoglycemia.  He has not established with ophthalmology since moving to the area.  He does report being more physically active with walking only but has been unable to bike which is a former passion of his.  In addition, he has been eating better by avoiding alcohol altogether for the last 32 days.  Asthma: Patient reports need for Ventolin refill.  He uses this medication daily for the sensation of his throat closing up however he denies shortness of breath or wheezing during these episodes.  He does report taking Advair for controller daily though he also has Flovent on his medication list.  He has not received pulmonary function testing in several years.  He is a current everyday smoker not interested in cessation.  He denies chest pain, shortness of breath, wheeze, fevers or chills, cough.  Depression with anxiety: Patient has a history of mixed depression with anxiety, predominantly depression.  He has historically been on low-dose Celexa for many years and recently increased to 40 mg daily with some improvement of symptoms.  He continues to be uninterested in seeing behavioral therapy at this time.  He has no recent history of suicidal or homicidal ideation in the last several years despite history of Tylenol overdose in his 30s.  ROS: see HPI for pertinent.  [place]: DM, GERD, asthma, EtOH use disorder, anxiety depression, anemia.Surgical history pancreatic pseudocyst  drainage, splenectomy, cholecystectomy, hernia repair x2, amputation of right great toe, R-TKAx2.Family history DM, HTN, HLD, CKD, thyroid, EtOH abuse, sickle cell trait, asthma. Smoking status reviewed. Medications reviewed.  Objective   BP 130/75 (BP Location: Left Arm, Patient Position: Sitting, Cuff Size: Large)   Pulse 75   Temp 98.5 F (36.9 C) (Oral)   Ht 6\' 4"  (1.93 m)   Wt 279 lb 6.4 oz (126.7 kg)   SpO2 98%   BMI 34.01 kg/m  Vitals and nursing note reviewed.  General: well nourished, well developed, NAD with non-toxic appearance HEENT: normocephalic, atraumatic, moist mucous membranes, chronically enlarged parotid glands bilaterally Neck: supple, non-tender without lymphadenopathy Cardiovascular: regular rate and rhythm without murmurs, rubs, or gallops Lungs: clear to auscultation bilaterally with normal work of breathing Abdomen: soft, non-tender, non-distended, normoactive bowel sounds Skin: warm, dry, no rashes or lesions, cap refill < 2 seconds Extremities: warm and well perfused, normal tone, no edema, mild callus on feet bilaterally with right distal toe partial amputation and decreased sensation throughout sparing heel and ankle  Assessment & Plan   Uncontrolled diabetes mellitus with hyperglycemia, with long-term current use of insulin (H[facility]) Chronic.  Poorly controlled improvement from 8.9, now 7.8.  Patient currently on high-dose insulin and metformin.  Poor candidate for GLP agonist given history of pancreatic insufficiency.  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 - Initiating Januvia 100 mg daily and continue metformin 1000 mg twice daily along with Levemir 60 units daily - Annual diabetic foot  exam performed - Declined flu shot - Patient to contact ophthalmology to schedule annual diabetic eye exam - RTC 3 months for next A1c check  Asthma, intermittent Chronic.  Uncertain  control given an appropriate use of albuterol inhaler.  He is currently on Advair for control.  No recent PFT.  Current everyday smoker. - Not interested in smoking cessation today - Scheduled appointment with Dr. Valentina Lucks for PFT - Given refill for albuterol inhaler and instructed to use only with shortness of breath and wheezing  Anxiety associated with depression Chronic.  Improved control since increasing Celexa to 30 mg daily.  Gad 7 score 12, somewhat difficult with PHQ 9 score 8, somewhat difficult.  Patient is not a risk to self or others. - Increase Celexa to 40 mg daily - RTC 1 month  Orders Placed This Encounter  Procedures  . HgB A1c   Meds ordered this encounter  Medications  . albuterol (PROVENTIL HFA;VENTOLIN HFA) 108 (90 Base) MCG/ACT inhaler    Sig: Inhale 2-4 puffs into the lungs every 4 (four) hours as needed for wheezing (or cough).    Dispense:  2 Inhaler    Refill:  2  . sitaGLIPtin (JANUVIA) 100 MG tablet    Sig: Take 1 tablet (100 mg total) by mouth daily.    Dispense:  90 tablet    Refill:  [place], [place], PGY-3 06/14/2018, 4:52 PM

## 2018-06-14 NOTE — Assessment & Plan Note (Signed)
Chronic.  Uncertain control given an appropriate use of albuterol inhaler.  He is currently on Advair for control.  No recent PFT.  Current everyday smoker. - Not interested in smoking cessation today - Scheduled appointment with Dr. Valentina Lucks for PFT - Given refill for albuterol inhaler and instructed to use only with shortness of breath and wheezing

## 2018-06-14 NOTE — Patient Instructions (Signed)
Thank you for coming in to see [place] today. Please see below to review our plan for today's visit.  1.  Diabetes is better controlled today.  We want to achieve an A1c goal of less than 7.0.  To do this, we will continue the current Levemir at 60 units daily and metformin.  In addition, I will add on a new medication called Januvia which she will take a 100 mg tablet daily.  We will reassess how you are doing in 3 months with this medication. 2.  I would like you to seriously consider getting your pneumonia and flu shot as you were very high risk of getting an infection which could likely kill you. 3.  Please call and schedule a diabetes eye exam as we discussed today.  This should be done every year. 4.  I called in a refill for your Ventolin which you should use only as needed with shortness of breath or wheezing, 2 puffs every 4 hours as needed.  Using your Advair daily.  We will work on getting you set up for pulmonary function testing here at our clinic with Dr. Valentina Lucks.  Please discontinue your Advair 2 days prior to the test so that we can get an accurate measurement. 5.  Increase your Celexa to 40 mg daily (2 tablets daily).  We will discuss this at your next visit in 1 month along with your knee pain.  Please call the clinic at [phone] if your symptoms worsen or you have any concerns. It was our pleasure to serve you.  Harriet Butte, [place], PGY-3

## 2018-06-14 NOTE — Assessment & Plan Note (Signed)
Chronic.  Improved control since increasing Celexa to 30 mg daily.  Gad 7 score 12, somewhat difficult with PHQ 9 score 8, somewhat difficult.  Patient is not a risk to self or others. - Increase Celexa to 40 mg daily - RTC 1 month

## 2018-06-14 NOTE — Assessment & Plan Note (Addendum)
Chronic.  Poorly controlled improvement from 8.9, now 7.8.  Patient currently on high-dose insulin and metformin.  Poor candidate for GLP agonist given history of pancreatic insufficiency.  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 - Initiating Januvia 100 mg daily and continue metformin 1000 mg twice daily along with Levemir 60 units daily - Annual diabetic foot exam performed - Declined flu shot - Patient to contact ophthalmology to schedule annual diabetic eye exam - RTC 3 months for next A1c check

## 2018-07-01 MED FILL — GABAPENTIN 300 MG CAPSULE: 300 | 90 days supply | Qty: 90 | Fill #0

## 2018-07-11 ENCOUNTER — Ambulatory Visit: Payer: 59 | Admitting: Gastroenterology

## 2018-07-18 ENCOUNTER — Ambulatory Visit: Payer: 59 | Admitting: Pharmacist

## 2018-07-20 ENCOUNTER — Ambulatory Visit: Payer: 59 | Admitting: Family Medicine

## 2018-07-20 MED FILL — metFORMIN HCL 1000 MG TABS: 1000 | 90 days supply | Qty: 180 | Fill #1

## 2018-07-20 MED FILL — FREESTYLE LITE TEST STRIP: 30 days supply | Qty: 100 | Fill #3

## 2018-07-29 MED FILL — JANUVIA 100 MG TABLET: 100 | 30 days supply | Qty: 30 | Fill #1

## 2018-08-16 DIAGNOSIS — F1020 Alcohol dependence, uncomplicated: Secondary | ICD-10-CM | POA: Diagnosis not present

## 2018-08-17 DIAGNOSIS — F1020 Alcohol dependence, uncomplicated: Secondary | ICD-10-CM | POA: Diagnosis not present

## 2018-08-18 DIAGNOSIS — F1020 Alcohol dependence, uncomplicated: Secondary | ICD-10-CM | POA: Diagnosis not present

## 2018-08-19 DIAGNOSIS — F1020 Alcohol dependence, uncomplicated: Secondary | ICD-10-CM | POA: Diagnosis not present

## 2018-08-20 DIAGNOSIS — F1020 Alcohol dependence, uncomplicated: Secondary | ICD-10-CM | POA: Diagnosis not present

## 2018-08-21 DIAGNOSIS — F1020 Alcohol dependence, uncomplicated: Secondary | ICD-10-CM | POA: Diagnosis not present

## 2018-08-22 ENCOUNTER — Ambulatory Visit: Payer: 59 | Admitting: Family Medicine

## 2018-08-22 DIAGNOSIS — E089 Diabetes mellitus due to underlying condition without complications: Secondary | ICD-10-CM | POA: Diagnosis not present

## 2018-08-22 DIAGNOSIS — F411 Generalized anxiety disorder: Secondary | ICD-10-CM | POA: Diagnosis not present

## 2018-08-22 DIAGNOSIS — F1020 Alcohol dependence, uncomplicated: Secondary | ICD-10-CM | POA: Diagnosis not present

## 2018-08-22 DIAGNOSIS — F332 Major depressive disorder, recurrent severe without psychotic features: Secondary | ICD-10-CM | POA: Diagnosis not present

## 2018-08-22 NOTE — Progress Notes (Deleted)
   Subjective   Patient ID: Christopher Fernandez    DOB: 10/10/1970, 47 y.o. male   MRN: [id number]  CC: "***"  HPI: Christopher Fernandez is a 47 y.o. male who presents to clinic today for the following:  ***: ***  ***Last seen by me October for uncontrolled diabetes.  Initiated Januvia 100 mg daily with instructions to continue 60 units of Levemir daily and metformin.  Patient declined flu shot.  Due for pneumonia vaccine.  ROS: see HPI for pertinent.  [place]: IDDM, GERD, asthma, EtOH use disorder, anxiety depression, anemia.Surgical history pancreatic pseudocyst drainage, splenectomy, cholecystectomy, hernia repair x2, amputation of right great toe, R-TKAx2.Family history DM, HTN, HLD, CKD, thyroid, EtOH abuse, sickle cell trait, asthma. Smoking status reviewed. Medications reviewed.  Objective   There were no vitals taken for this visit. Vitals and nursing note reviewed.  General: well nourished, well developed, NAD with non-toxic appearance HEENT: normocephalic, atraumatic, moist mucous membranes Neck: supple, non-tender without lymphadenopathy Cardiovascular: regular rate and rhythm without murmurs, rubs, or gallops Lungs: clear to auscultation bilaterally with normal work of breathing Abdomen: soft, non-tender, non-distended, normoactive bowel sounds Skin: warm, dry, no rashes or lesions, cap refill < 2 seconds Extremities: warm and well perfused, normal tone, no edema  Assessment & Plan   No problem-specific Assessment & Plan notes found for this encounter.  No orders of the defined types were placed in this encounter.  No orders of the defined types were placed in this encounter.   Harriet Butte, [place], PGY-3 08/22/2018, 8:20 AM

## 2018-08-23 DIAGNOSIS — E089 Diabetes mellitus due to underlying condition without complications: Secondary | ICD-10-CM | POA: Diagnosis not present

## 2018-08-23 DIAGNOSIS — F332 Major depressive disorder, recurrent severe without psychotic features: Secondary | ICD-10-CM | POA: Diagnosis not present

## 2018-08-23 DIAGNOSIS — F411 Generalized anxiety disorder: Secondary | ICD-10-CM | POA: Diagnosis not present

## 2018-08-23 DIAGNOSIS — F1020 Alcohol dependence, uncomplicated: Secondary | ICD-10-CM | POA: Diagnosis not present

## 2018-08-24 DIAGNOSIS — F332 Major depressive disorder, recurrent severe without psychotic features: Secondary | ICD-10-CM | POA: Diagnosis not present

## 2018-08-24 DIAGNOSIS — F1020 Alcohol dependence, uncomplicated: Secondary | ICD-10-CM | POA: Diagnosis not present

## 2018-08-24 DIAGNOSIS — F411 Generalized anxiety disorder: Secondary | ICD-10-CM | POA: Diagnosis not present

## 2018-08-24 DIAGNOSIS — E089 Diabetes mellitus due to underlying condition without complications: Secondary | ICD-10-CM | POA: Diagnosis not present

## 2018-08-25 DIAGNOSIS — F332 Major depressive disorder, recurrent severe without psychotic features: Secondary | ICD-10-CM | POA: Diagnosis not present

## 2018-08-25 DIAGNOSIS — E089 Diabetes mellitus due to underlying condition without complications: Secondary | ICD-10-CM | POA: Diagnosis not present

## 2018-08-25 DIAGNOSIS — F1020 Alcohol dependence, uncomplicated: Secondary | ICD-10-CM | POA: Diagnosis not present

## 2018-08-25 DIAGNOSIS — F411 Generalized anxiety disorder: Secondary | ICD-10-CM | POA: Diagnosis not present

## 2018-08-26 DIAGNOSIS — F1020 Alcohol dependence, uncomplicated: Secondary | ICD-10-CM | POA: Diagnosis not present

## 2018-08-26 DIAGNOSIS — F332 Major depressive disorder, recurrent severe without psychotic features: Secondary | ICD-10-CM | POA: Diagnosis not present

## 2018-08-26 DIAGNOSIS — F411 Generalized anxiety disorder: Secondary | ICD-10-CM | POA: Diagnosis not present

## 2018-08-26 DIAGNOSIS — E089 Diabetes mellitus due to underlying condition without complications: Secondary | ICD-10-CM | POA: Diagnosis not present

## 2018-08-27 DIAGNOSIS — F1020 Alcohol dependence, uncomplicated: Secondary | ICD-10-CM | POA: Diagnosis not present

## 2018-08-27 DIAGNOSIS — F411 Generalized anxiety disorder: Secondary | ICD-10-CM | POA: Diagnosis not present

## 2018-08-27 DIAGNOSIS — E089 Diabetes mellitus due to underlying condition without complications: Secondary | ICD-10-CM | POA: Diagnosis not present

## 2018-08-27 DIAGNOSIS — F332 Major depressive disorder, recurrent severe without psychotic features: Secondary | ICD-10-CM | POA: Diagnosis not present

## 2018-08-29 DIAGNOSIS — F1020 Alcohol dependence, uncomplicated: Secondary | ICD-10-CM | POA: Diagnosis not present

## 2018-08-29 DIAGNOSIS — F332 Major depressive disorder, recurrent severe without psychotic features: Secondary | ICD-10-CM | POA: Diagnosis not present

## 2018-08-29 DIAGNOSIS — F411 Generalized anxiety disorder: Secondary | ICD-10-CM | POA: Diagnosis not present

## 2018-08-29 DIAGNOSIS — E089 Diabetes mellitus due to underlying condition without complications: Secondary | ICD-10-CM | POA: Diagnosis not present

## 2018-08-30 DIAGNOSIS — F1020 Alcohol dependence, uncomplicated: Secondary | ICD-10-CM | POA: Diagnosis not present

## 2018-08-30 DIAGNOSIS — F411 Generalized anxiety disorder: Secondary | ICD-10-CM | POA: Diagnosis not present

## 2018-08-30 DIAGNOSIS — F332 Major depressive disorder, recurrent severe without psychotic features: Secondary | ICD-10-CM | POA: Diagnosis not present

## 2018-08-30 DIAGNOSIS — E089 Diabetes mellitus due to underlying condition without complications: Secondary | ICD-10-CM | POA: Diagnosis not present

## 2018-08-30 MED FILL — JANUVIA 100 MG TABLET: 100 | 30 days supply | Qty: 30 | Fill #2

## 2018-08-30 MED FILL — FREESTYLE LITE TEST STRIP: 30 days supply | Qty: 100 | Fill #4

## 2018-08-30 MED FILL — traZODone HCL 50 MG TABS: 50 | 90 days supply | Qty: 180 | Fill #2

## 2018-08-30 MED FILL — FREESTYLE LANCETS: 30 days supply | Qty: 100 | Fill #2

## 2018-08-31 DIAGNOSIS — E089 Diabetes mellitus due to underlying condition without complications: Secondary | ICD-10-CM | POA: Diagnosis not present

## 2018-08-31 DIAGNOSIS — F411 Generalized anxiety disorder: Secondary | ICD-10-CM | POA: Diagnosis not present

## 2018-08-31 DIAGNOSIS — F1020 Alcohol dependence, uncomplicated: Secondary | ICD-10-CM | POA: Diagnosis not present

## 2018-08-31 DIAGNOSIS — F332 Major depressive disorder, recurrent severe without psychotic features: Secondary | ICD-10-CM | POA: Diagnosis not present

## 2018-09-01 DIAGNOSIS — F411 Generalized anxiety disorder: Secondary | ICD-10-CM | POA: Diagnosis not present

## 2018-09-01 DIAGNOSIS — F1020 Alcohol dependence, uncomplicated: Secondary | ICD-10-CM | POA: Diagnosis not present

## 2018-09-01 DIAGNOSIS — F332 Major depressive disorder, recurrent severe without psychotic features: Secondary | ICD-10-CM | POA: Diagnosis not present

## 2018-09-01 DIAGNOSIS — E089 Diabetes mellitus due to underlying condition without complications: Secondary | ICD-10-CM | POA: Diagnosis not present

## 2018-09-01 MED FILL — MONTELUKAST SOD 10 MG TAB: 10 | 90 days supply | Qty: 90 | Fill #1

## 2018-09-01 MED FILL — HUMALOG 100 UNITS/ML KWIKPE: 100 | 80 days supply | Qty: 12 | Fill #1

## 2018-09-02 ENCOUNTER — Other Ambulatory Visit: Payer: Self-pay | Admitting: Family Medicine

## 2018-09-02 DIAGNOSIS — F411 Generalized anxiety disorder: Secondary | ICD-10-CM | POA: Diagnosis not present

## 2018-09-02 DIAGNOSIS — F332 Major depressive disorder, recurrent severe without psychotic features: Secondary | ICD-10-CM | POA: Diagnosis not present

## 2018-09-02 DIAGNOSIS — E089 Diabetes mellitus due to underlying condition without complications: Secondary | ICD-10-CM | POA: Diagnosis not present

## 2018-09-02 DIAGNOSIS — F1020 Alcohol dependence, uncomplicated: Secondary | ICD-10-CM | POA: Diagnosis not present

## 2018-09-02 DIAGNOSIS — E1165 Type 2 diabetes mellitus with hyperglycemia: Secondary | ICD-10-CM

## 2018-09-02 DIAGNOSIS — Z794 Long term (current) use of insulin: Secondary | ICD-10-CM

## 2018-09-02 MED ORDER — INSULIN LISPRO (0.5 UNIT DIAL) 100 UNIT/ML (KWIKPEN JR): 5.0000 [IU] | PEN_INJECTOR | Freq: Three times a day (TID) | SUBCUTANEOUS | 3 refills | Status: DC

## 2018-09-02 MED ORDER — EASY TOUCH PEN NEEDLES 31G X 5 MM MISC: 1.0000 "application " | Freq: Three times a day (TID) | 11 refills | Status: DC

## 2018-09-02 MED ORDER — INSULIN DETEMIR 100 UNIT/ML FLEXPEN: 60.0000 [IU] | PEN_INJECTOR | Freq: Every day | SUBCUTANEOUS | 11 refills | Status: DC

## 2018-09-03 DIAGNOSIS — F332 Major depressive disorder, recurrent severe without psychotic features: Secondary | ICD-10-CM | POA: Diagnosis not present

## 2018-09-03 DIAGNOSIS — F1020 Alcohol dependence, uncomplicated: Secondary | ICD-10-CM | POA: Diagnosis not present

## 2018-09-03 DIAGNOSIS — E089 Diabetes mellitus due to underlying condition without complications: Secondary | ICD-10-CM | POA: Diagnosis not present

## 2018-09-03 DIAGNOSIS — F411 Generalized anxiety disorder: Secondary | ICD-10-CM | POA: Diagnosis not present

## 2018-09-05 DIAGNOSIS — F411 Generalized anxiety disorder: Secondary | ICD-10-CM | POA: Diagnosis not present

## 2018-09-05 DIAGNOSIS — F332 Major depressive disorder, recurrent severe without psychotic features: Secondary | ICD-10-CM | POA: Diagnosis not present

## 2018-09-05 DIAGNOSIS — E089 Diabetes mellitus due to underlying condition without complications: Secondary | ICD-10-CM | POA: Diagnosis not present

## 2018-09-05 DIAGNOSIS — F1020 Alcohol dependence, uncomplicated: Secondary | ICD-10-CM | POA: Diagnosis not present

## 2018-09-05 MED ORDER — INSULIN LISPRO (1 UNIT DIAL) 100 UNIT/ML (KWIKPEN): 5.0000 [IU] | PEN_INJECTOR | Freq: Three times a day (TID) | SUBCUTANEOUS | 2 refills | Status: DC

## 2018-09-05 MED FILL — UNIFINE PENTIPS 31GX3/16: 31G X 5 MM | 33 days supply | Qty: 100 | Fill #0

## 2018-09-05 MED FILL — LEVEMIR FLEXTOUCH 100 UNITS: 100 | 25 days supply | Qty: 15 | Fill #0

## 2018-09-05 NOTE — Progress Notes (Signed)
Rx was for [employer].  Same issue a few months ago.  New script sent to [facility] outpatient pharmacy. [place], Salome Spotted, CMA

## 2018-09-05 NOTE — Addendum Note (Signed)
Addended by: Christen Bame D on: 09/05/2018 03:36 PM   Modules accepted: Orders

## 2018-09-06 DIAGNOSIS — F1020 Alcohol dependence, uncomplicated: Secondary | ICD-10-CM | POA: Diagnosis not present

## 2018-09-06 DIAGNOSIS — F411 Generalized anxiety disorder: Secondary | ICD-10-CM | POA: Diagnosis not present

## 2018-09-06 DIAGNOSIS — E089 Diabetes mellitus due to underlying condition without complications: Secondary | ICD-10-CM | POA: Diagnosis not present

## 2018-09-06 DIAGNOSIS — F332 Major depressive disorder, recurrent severe without psychotic features: Secondary | ICD-10-CM | POA: Diagnosis not present

## 2018-09-08 DIAGNOSIS — F411 Generalized anxiety disorder: Secondary | ICD-10-CM | POA: Diagnosis not present

## 2018-09-08 DIAGNOSIS — F332 Major depressive disorder, recurrent severe without psychotic features: Secondary | ICD-10-CM | POA: Diagnosis not present

## 2018-09-08 DIAGNOSIS — E089 Diabetes mellitus due to underlying condition without complications: Secondary | ICD-10-CM | POA: Diagnosis not present

## 2018-09-08 DIAGNOSIS — F1020 Alcohol dependence, uncomplicated: Secondary | ICD-10-CM | POA: Diagnosis not present

## 2018-09-09 DIAGNOSIS — F332 Major depressive disorder, recurrent severe without psychotic features: Secondary | ICD-10-CM | POA: Diagnosis not present

## 2018-09-09 DIAGNOSIS — F1020 Alcohol dependence, uncomplicated: Secondary | ICD-10-CM | POA: Diagnosis not present

## 2018-09-09 DIAGNOSIS — E089 Diabetes mellitus due to underlying condition without complications: Secondary | ICD-10-CM | POA: Diagnosis not present

## 2018-09-09 DIAGNOSIS — F411 Generalized anxiety disorder: Secondary | ICD-10-CM | POA: Diagnosis not present

## 2018-09-10 DIAGNOSIS — F1020 Alcohol dependence, uncomplicated: Secondary | ICD-10-CM | POA: Diagnosis not present

## 2018-09-10 DIAGNOSIS — F411 Generalized anxiety disorder: Secondary | ICD-10-CM | POA: Diagnosis not present

## 2018-09-10 DIAGNOSIS — F332 Major depressive disorder, recurrent severe without psychotic features: Secondary | ICD-10-CM | POA: Diagnosis not present

## 2018-09-10 DIAGNOSIS — E089 Diabetes mellitus due to underlying condition without complications: Secondary | ICD-10-CM | POA: Diagnosis not present

## 2018-09-12 DIAGNOSIS — F1020 Alcohol dependence, uncomplicated: Secondary | ICD-10-CM | POA: Diagnosis not present

## 2018-09-12 DIAGNOSIS — F411 Generalized anxiety disorder: Secondary | ICD-10-CM | POA: Diagnosis not present

## 2018-09-12 DIAGNOSIS — F332 Major depressive disorder, recurrent severe without psychotic features: Secondary | ICD-10-CM | POA: Diagnosis not present

## 2018-09-12 DIAGNOSIS — E089 Diabetes mellitus due to underlying condition without complications: Secondary | ICD-10-CM | POA: Diagnosis not present

## 2018-09-16 DIAGNOSIS — F411 Generalized anxiety disorder: Secondary | ICD-10-CM | POA: Diagnosis not present

## 2018-09-16 DIAGNOSIS — E089 Diabetes mellitus due to underlying condition without complications: Secondary | ICD-10-CM | POA: Diagnosis not present

## 2018-09-16 DIAGNOSIS — F1020 Alcohol dependence, uncomplicated: Secondary | ICD-10-CM | POA: Diagnosis not present

## 2018-09-16 DIAGNOSIS — F332 Major depressive disorder, recurrent severe without psychotic features: Secondary | ICD-10-CM | POA: Diagnosis not present

## 2018-09-19 DIAGNOSIS — F1020 Alcohol dependence, uncomplicated: Secondary | ICD-10-CM | POA: Diagnosis not present

## 2018-09-19 DIAGNOSIS — F332 Major depressive disorder, recurrent severe without psychotic features: Secondary | ICD-10-CM | POA: Diagnosis not present

## 2018-09-19 DIAGNOSIS — F411 Generalized anxiety disorder: Secondary | ICD-10-CM | POA: Diagnosis not present

## 2018-09-19 DIAGNOSIS — E089 Diabetes mellitus due to underlying condition without complications: Secondary | ICD-10-CM | POA: Diagnosis not present

## 2018-09-20 DIAGNOSIS — E089 Diabetes mellitus due to underlying condition without complications: Secondary | ICD-10-CM | POA: Diagnosis not present

## 2018-09-20 DIAGNOSIS — F1020 Alcohol dependence, uncomplicated: Secondary | ICD-10-CM | POA: Diagnosis not present

## 2018-09-20 DIAGNOSIS — F411 Generalized anxiety disorder: Secondary | ICD-10-CM | POA: Diagnosis not present

## 2018-09-20 DIAGNOSIS — F332 Major depressive disorder, recurrent severe without psychotic features: Secondary | ICD-10-CM | POA: Diagnosis not present

## 2018-09-21 DIAGNOSIS — E089 Diabetes mellitus due to underlying condition without complications: Secondary | ICD-10-CM | POA: Diagnosis not present

## 2018-09-21 DIAGNOSIS — F332 Major depressive disorder, recurrent severe without psychotic features: Secondary | ICD-10-CM | POA: Diagnosis not present

## 2018-09-21 DIAGNOSIS — F411 Generalized anxiety disorder: Secondary | ICD-10-CM | POA: Diagnosis not present

## 2018-09-21 DIAGNOSIS — F1020 Alcohol dependence, uncomplicated: Secondary | ICD-10-CM | POA: Diagnosis not present

## 2018-09-22 DIAGNOSIS — E089 Diabetes mellitus due to underlying condition without complications: Secondary | ICD-10-CM | POA: Diagnosis not present

## 2018-09-22 DIAGNOSIS — F1020 Alcohol dependence, uncomplicated: Secondary | ICD-10-CM | POA: Diagnosis not present

## 2018-09-22 DIAGNOSIS — F411 Generalized anxiety disorder: Secondary | ICD-10-CM | POA: Diagnosis not present

## 2018-09-22 DIAGNOSIS — F332 Major depressive disorder, recurrent severe without psychotic features: Secondary | ICD-10-CM | POA: Diagnosis not present

## 2018-09-23 DIAGNOSIS — F411 Generalized anxiety disorder: Secondary | ICD-10-CM | POA: Diagnosis not present

## 2018-09-23 DIAGNOSIS — E089 Diabetes mellitus due to underlying condition without complications: Secondary | ICD-10-CM | POA: Diagnosis not present

## 2018-09-23 DIAGNOSIS — F332 Major depressive disorder, recurrent severe without psychotic features: Secondary | ICD-10-CM | POA: Diagnosis not present

## 2018-09-23 DIAGNOSIS — F1020 Alcohol dependence, uncomplicated: Secondary | ICD-10-CM | POA: Diagnosis not present

## 2018-09-26 DIAGNOSIS — F332 Major depressive disorder, recurrent severe without psychotic features: Secondary | ICD-10-CM | POA: Diagnosis not present

## 2018-09-26 DIAGNOSIS — F411 Generalized anxiety disorder: Secondary | ICD-10-CM | POA: Diagnosis not present

## 2018-09-26 DIAGNOSIS — E089 Diabetes mellitus due to underlying condition without complications: Secondary | ICD-10-CM | POA: Diagnosis not present

## 2018-09-26 DIAGNOSIS — F1020 Alcohol dependence, uncomplicated: Secondary | ICD-10-CM | POA: Diagnosis not present

## 2018-09-27 DIAGNOSIS — F332 Major depressive disorder, recurrent severe without psychotic features: Secondary | ICD-10-CM | POA: Diagnosis not present

## 2018-09-27 DIAGNOSIS — F411 Generalized anxiety disorder: Secondary | ICD-10-CM | POA: Diagnosis not present

## 2018-09-27 DIAGNOSIS — E089 Diabetes mellitus due to underlying condition without complications: Secondary | ICD-10-CM | POA: Diagnosis not present

## 2018-09-27 DIAGNOSIS — F1020 Alcohol dependence, uncomplicated: Secondary | ICD-10-CM | POA: Diagnosis not present

## 2018-09-28 DIAGNOSIS — F332 Major depressive disorder, recurrent severe without psychotic features: Secondary | ICD-10-CM | POA: Diagnosis not present

## 2018-09-28 DIAGNOSIS — F1020 Alcohol dependence, uncomplicated: Secondary | ICD-10-CM | POA: Diagnosis not present

## 2018-09-28 DIAGNOSIS — E089 Diabetes mellitus due to underlying condition without complications: Secondary | ICD-10-CM | POA: Diagnosis not present

## 2018-09-28 DIAGNOSIS — F411 Generalized anxiety disorder: Secondary | ICD-10-CM | POA: Diagnosis not present

## 2018-09-29 DIAGNOSIS — F1020 Alcohol dependence, uncomplicated: Secondary | ICD-10-CM | POA: Diagnosis not present

## 2018-09-29 DIAGNOSIS — F411 Generalized anxiety disorder: Secondary | ICD-10-CM | POA: Diagnosis not present

## 2018-09-29 DIAGNOSIS — F332 Major depressive disorder, recurrent severe without psychotic features: Secondary | ICD-10-CM | POA: Diagnosis not present

## 2018-09-29 DIAGNOSIS — E089 Diabetes mellitus due to underlying condition without complications: Secondary | ICD-10-CM | POA: Diagnosis not present

## 2018-09-30 DIAGNOSIS — F1020 Alcohol dependence, uncomplicated: Secondary | ICD-10-CM | POA: Diagnosis not present

## 2018-09-30 DIAGNOSIS — E089 Diabetes mellitus due to underlying condition without complications: Secondary | ICD-10-CM | POA: Diagnosis not present

## 2018-09-30 DIAGNOSIS — F411 Generalized anxiety disorder: Secondary | ICD-10-CM | POA: Diagnosis not present

## 2018-09-30 DIAGNOSIS — F332 Major depressive disorder, recurrent severe without psychotic features: Secondary | ICD-10-CM | POA: Diagnosis not present

## 2018-10-03 DIAGNOSIS — E089 Diabetes mellitus due to underlying condition without complications: Secondary | ICD-10-CM | POA: Diagnosis not present

## 2018-10-03 DIAGNOSIS — F332 Major depressive disorder, recurrent severe without psychotic features: Secondary | ICD-10-CM | POA: Diagnosis not present

## 2018-10-03 DIAGNOSIS — F1020 Alcohol dependence, uncomplicated: Secondary | ICD-10-CM | POA: Diagnosis not present

## 2018-10-03 DIAGNOSIS — F411 Generalized anxiety disorder: Secondary | ICD-10-CM | POA: Diagnosis not present

## 2018-10-05 DIAGNOSIS — E089 Diabetes mellitus due to underlying condition without complications: Secondary | ICD-10-CM | POA: Diagnosis not present

## 2018-10-05 DIAGNOSIS — F332 Major depressive disorder, recurrent severe without psychotic features: Secondary | ICD-10-CM | POA: Diagnosis not present

## 2018-10-05 DIAGNOSIS — F411 Generalized anxiety disorder: Secondary | ICD-10-CM | POA: Diagnosis not present

## 2018-10-05 DIAGNOSIS — F1020 Alcohol dependence, uncomplicated: Secondary | ICD-10-CM | POA: Diagnosis not present

## 2018-10-07 DIAGNOSIS — F332 Major depressive disorder, recurrent severe without psychotic features: Secondary | ICD-10-CM | POA: Diagnosis not present

## 2018-10-07 DIAGNOSIS — F1020 Alcohol dependence, uncomplicated: Secondary | ICD-10-CM | POA: Diagnosis not present

## 2018-10-07 DIAGNOSIS — F411 Generalized anxiety disorder: Secondary | ICD-10-CM | POA: Diagnosis not present

## 2018-10-07 DIAGNOSIS — E089 Diabetes mellitus due to underlying condition without complications: Secondary | ICD-10-CM | POA: Diagnosis not present

## 2018-10-10 DIAGNOSIS — E089 Diabetes mellitus due to underlying condition without complications: Secondary | ICD-10-CM | POA: Diagnosis not present

## 2018-10-10 DIAGNOSIS — F411 Generalized anxiety disorder: Secondary | ICD-10-CM | POA: Diagnosis not present

## 2018-10-10 DIAGNOSIS — F332 Major depressive disorder, recurrent severe without psychotic features: Secondary | ICD-10-CM | POA: Diagnosis not present

## 2018-10-10 DIAGNOSIS — F1020 Alcohol dependence, uncomplicated: Secondary | ICD-10-CM | POA: Diagnosis not present

## 2018-10-10 MED FILL — JANUVIA 100 MG TABLET: 100 | 30 days supply | Qty: 30 | Fill #3

## 2018-10-10 MED FILL — VENTOLIN HFA 90 MCG INHALER: 108 (90 BAS | 34 days supply | Qty: 36 | Fill #1

## 2018-10-10 MED FILL — metFORMIN HCL 1000 MG TABS: 1000 | 90 days supply | Qty: 180 | Fill #2

## 2018-10-12 DIAGNOSIS — F1020 Alcohol dependence, uncomplicated: Secondary | ICD-10-CM | POA: Diagnosis not present

## 2018-10-12 DIAGNOSIS — E089 Diabetes mellitus due to underlying condition without complications: Secondary | ICD-10-CM | POA: Diagnosis not present

## 2018-10-12 DIAGNOSIS — F332 Major depressive disorder, recurrent severe without psychotic features: Secondary | ICD-10-CM | POA: Diagnosis not present

## 2018-10-12 DIAGNOSIS — F411 Generalized anxiety disorder: Secondary | ICD-10-CM | POA: Diagnosis not present

## 2018-10-14 DIAGNOSIS — E089 Diabetes mellitus due to underlying condition without complications: Secondary | ICD-10-CM | POA: Diagnosis not present

## 2018-10-14 DIAGNOSIS — F332 Major depressive disorder, recurrent severe without psychotic features: Secondary | ICD-10-CM | POA: Diagnosis not present

## 2018-10-14 DIAGNOSIS — F1020 Alcohol dependence, uncomplicated: Secondary | ICD-10-CM | POA: Diagnosis not present

## 2018-10-14 DIAGNOSIS — F411 Generalized anxiety disorder: Secondary | ICD-10-CM | POA: Diagnosis not present

## 2018-10-19 MED FILL — DULoxetine HCL 60 MG CPEP: 60 | 30 days supply | Qty: 30 | Fill #0

## 2018-10-19 MED FILL — FLUTICASONE PROP 50 MCG SPR: 50 | 30 days supply | Qty: 16 | Fill #0

## 2018-10-19 MED FILL — ARIPiprazole 5 MG TABS: 5 | 30 days supply | Qty: 30 | Fill #0

## 2018-10-19 MED FILL — MIRTAZAPINE 30 MG TABLET: 30 | 30 days supply | Qty: 30 | Fill #0

## 2018-10-19 MED FILL — GABAPENTIN 300 MG CAPSULE: 300 | 90 days supply | Qty: 90 | Fill #1

## 2018-10-24 MED FILL — FREESTYLE LITE TEST STRIP: 30 days supply | Qty: 100 | Fill #5 | Status: TO

## 2018-11-09 ENCOUNTER — Ambulatory Visit: Payer: 59 | Admitting: Family Medicine

## 2018-11-14 MED FILL — HUMALOG 100 UNITS/ML KWIKPE: 100 | 80 days supply | Qty: 12 | Fill #2 | Status: TO

## 2018-11-14 MED FILL — JANUVIA 100 MG TABLET: 100 | 30 days supply | Qty: 30 | Fill #4 | Status: TO

## 2018-11-23 ENCOUNTER — Ambulatory Visit (INDEPENDENT_AMBULATORY_CARE_PROVIDER_SITE_OTHER): Payer: 59 | Admitting: Family Medicine

## 2018-11-23 DIAGNOSIS — Z5329 Procedure and treatment not carried out because of patient's decision for other reasons: Secondary | ICD-10-CM

## 2018-11-23 NOTE — Progress Notes (Signed)
No show. Letter sent.

## 2018-11-24 ENCOUNTER — Telehealth: Payer: Self-pay | Admitting: Family Medicine

## 2018-11-24 NOTE — Telephone Encounter (Signed)
Called regarding missed appt 11/23/2018. No answer, LVM. Instructed to call clinic to reschedule. Did give warning regarding no-show/cancelation policy. This is Mr. Bowsher 4th cancellation/no-show (11/23/2018, 11/09/2018, 08/22/2018, 07/20/2018). Will send letter informing him of our no-show policy.  Harriet Butte, [place], PGY-3

## 2018-12-01 NOTE — Telephone Encounter (Signed)
Pt calling to reschedule this appt, since Christopher Fernandez was suggesting he schedule it I was going to even with limiting pts for COVID-19. Offered pt McMullens first available appt which was the end of April and pt states he needs to be seen before then. Please call pt to discuss this.

## 2018-12-02 ENCOUNTER — Telehealth: Payer: Self-pay | Admitting: Family Medicine

## 2018-12-02 ENCOUNTER — Other Ambulatory Visit: Payer: Self-pay | Admitting: Family Medicine

## 2018-12-02 MED ORDER — MIRTAZAPINE 30 MG PO TABS: 30.0000 mg | ORAL_TABLET | Freq: Every day | ORAL | 0 refills | Status: DC

## 2018-12-02 MED ORDER — ARIPIPRAZOLE 5 MG PO TABS: 5.0000 mg | ORAL_TABLET | Freq: Every day | ORAL | 0 refills | Status: DC

## 2018-12-02 MED ORDER — DULOXETINE HCL 60 MG PO CPEP: 60.0000 mg | ORAL_CAPSULE | Freq: Every day | ORAL | 0 refills | Status: DC

## 2018-12-02 MED FILL — MIRTAZAPINE 30 MG TABLET: 30 | 90 days supply | Qty: 90 | Fill #0

## 2018-12-02 MED FILL — DULoxetine HCL 60 MG CPEP: 60 | 90 days supply | Qty: 90 | Fill #0

## 2018-12-02 MED FILL — ARIPiprazole 5 MG TABS: 5 | 90 days supply | Qty: 90 | Fill #0

## 2018-12-02 NOTE — Telephone Encounter (Signed)
See phone note.  David McMullen, DO  Family Medicine, PGY-3 

## 2018-12-02 NOTE — Telephone Encounter (Signed)
Returned call to patient. Recently discharged from 60-day EtOH rehab facility. Doing well. Had medication changes. Currently needs refills on Cymbalta (d/c'd Celexa), Remeron, and Abilify. Tolerating medications. No concerns. Says fasting glucose has been 120s. Has enough of other meds. Advised patient to contact clinic for any questions or concerns.  Harriet Butte, [place], PGY-3

## 2018-12-05 MED FILL — FREESTYLE LITE TEST STRIP: 30 days supply | Qty: 100 | Fill #0

## 2018-12-23 ENCOUNTER — Other Ambulatory Visit: Payer: Self-pay | Admitting: Family Medicine

## 2018-12-23 MED FILL — MONTELUKAST SOD 10 MG TAB: 10 | 90 days supply | Qty: 90 | Fill #0

## 2018-12-23 MED FILL — LEVEMIR FLEXTOUCH 100 UNITS: 100 | 25 days supply | Qty: 15 | Fill #0

## 2018-12-24 MED FILL — HUMALOG 100 UNITS/ML KWIKPE: 100 | 80 days supply | Qty: 12 | Fill #0

## 2019-01-03 MED FILL — JANUVIA 100 MG TABLET: 100 | 30 days supply | Qty: 30 | Fill #0

## 2019-01-06 ENCOUNTER — Ambulatory Visit: Payer: 59 | Admitting: Family Medicine

## 2019-01-06 ENCOUNTER — Encounter: Payer: Self-pay | Admitting: Family Medicine

## 2019-01-06 ENCOUNTER — Telehealth (INDEPENDENT_AMBULATORY_CARE_PROVIDER_SITE_OTHER): Payer: 59 | Admitting: Family Medicine

## 2019-01-06 ENCOUNTER — Other Ambulatory Visit: Payer: Self-pay

## 2019-01-06 ENCOUNTER — Telehealth: Payer: 59

## 2019-01-06 DIAGNOSIS — Z794 Long term (current) use of insulin: Secondary | ICD-10-CM

## 2019-01-06 DIAGNOSIS — F418 Other specified anxiety disorders: Secondary | ICD-10-CM

## 2019-01-06 DIAGNOSIS — E1165 Type 2 diabetes mellitus with hyperglycemia: Secondary | ICD-10-CM | POA: Diagnosis not present

## 2019-01-06 MED FILL — traZODone HCL 50 MG TABS: 50 | 90 days supply | Qty: 180 | Fill #0

## 2019-01-06 NOTE — Assessment & Plan Note (Signed)
With insomnia. This is managed by his Psychiatrist. I recommended f/u with them. He plan on calling today or next week. He is otherwise psychologically stable. F/U as needed.

## 2019-01-06 NOTE — Assessment & Plan Note (Signed)
He is due for A1C check. Since he wants to come in to talk with his PCP directly, he can get A1C checked next week with his PCP. Continue current DM regimen. Adjustment of meds, per PCP after A1C check. He verbalized understanding.

## 2019-01-06 NOTE — Progress Notes (Signed)
[facility] Telemedicine Visit  Patient consented to have virtual visit. Method of visit: Video  Encounter participants: Patient: Christopher Fernandez - located at home Provider: Andrena Mews - located at office Others (if applicable): NA  Chief Complaint: F/U  HPI:  DM2: he is compliant with all his meds. His CBG range in the past few weeks is between 260-420. Much better today, in the 140s. Denies any DM related concerns.  Insomnia:His medication was recently changed by his Psych. He was previously on Trazodone qhs prn, this was switched to Remeron which is not helping at all. He denies suicidal or homicidal ideation. No other Psych concern.  ROS: per HPI  Pertinent PMHx: Problem list reviewed  Exam:  Respiratory: No resp distress Psych: Mood stable, not suicidal.  Assessment/Plan:  Uncontrolled diabetes mellitus with hyperglycemia, with long-term current use of insulin ([place]) He is due for A1C check. Since he wants to come in to talk with his PCP directly, he can get A1C checked next week with his PCP. Continue current DM regimen. Adjustment of meds, per PCP after A1C check. He verbalized understanding.  Anxiety associated with depression With insomnia. This is managed by his Psychiatrist. I recommended f/u with them. He plan on calling today or next week. He is otherwise psychologically stable. F/U as needed.    Time spent during visit with patient: 15 minutes

## 2019-01-12 ENCOUNTER — Ambulatory Visit: Payer: 59

## 2019-01-24 ENCOUNTER — Ambulatory Visit: Payer: 59 | Admitting: Family Medicine

## 2019-01-26 MED FILL — ADVAIR 250/50 DISKUS: 250-50 | 30 days supply | Qty: 60 | Fill #0

## 2019-01-26 MED FILL — FREESTYLE LITE TEST STRIP: 30 days supply | Qty: 100 | Fill #1

## 2019-01-26 MED FILL — metFORMIN HCL 1000 MG TABS: 1000 | 90 days supply | Qty: 180 | Fill #0

## 2019-01-30 ENCOUNTER — Ambulatory Visit: Payer: 59 | Admitting: Family Medicine

## 2019-01-30 NOTE — Progress Notes (Deleted)
   Subjective   Patient ID: Jaquane Boughner    DOB: 17-May-1971, 48 y.o. male   MRN: [id number]  CC: "***"  HPI: Skeeter Sheard is a 48 y.o. male who presents to clinic today for the following:  ***: ***  ROS: see HPI for pertinent.  [place]: Reviewed. Smoking status reviewed. Medications reviewed.  Objective   There were no vitals taken for this visit. Vitals and nursing note reviewed.  General: well nourished, well developed, NAD with non-toxic appearance HEENT: normocephalic, atraumatic, moist mucous membranes Neck: supple, non-tender without lymphadenopathy Cardiovascular: regular rate and rhythm without murmurs, rubs, or gallops Lungs: clear to auscultation bilaterally with normal work of breathing Abdomen: soft, non-tender, non-distended, normoactive bowel sounds Skin: warm, dry, no rashes or lesions, cap refill < 2 seconds Extremities: warm and well perfused, normal tone, no edema  Assessment & Plan   No problem-specific Assessment & Plan notes found for this encounter.  No orders of the defined types were placed in this encounter.  No orders of the defined types were placed in this encounter.   Harriet Butte, [place], PGY-3 01/30/2019, 8:26 AM

## 2019-02-03 ENCOUNTER — Ambulatory Visit: Payer: 59 | Admitting: Family Medicine

## 2019-02-07 MED FILL — JANUVIA 100 MG TABLET: 100 | 30 days supply | Qty: 30 | Fill #1

## 2019-02-07 MED FILL — GABAPENTIN 300 MG CAPSULE: 300 | 90 days supply | Qty: 90 | Fill #0

## 2019-03-02 ENCOUNTER — Other Ambulatory Visit: Payer: Self-pay | Admitting: Family Medicine

## 2019-03-02 MED FILL — HUMALOG 100 UNITS/ML KWIKPE: 100 | 80 days supply | Qty: 12 | Fill #1

## 2019-03-03 MED FILL — MIRTAZAPINE 30 MG TABLET: 30 | 90 days supply | Qty: 90 | Fill #0

## 2019-03-10 MED FILL — FREESTYLE LITE TEST STRIP: 30 days supply | Qty: 100 | Fill #2

## 2019-03-13 ENCOUNTER — Other Ambulatory Visit: Payer: Self-pay | Admitting: Family Medicine

## 2019-03-13 MED FILL — LEVEMIR FLEXTOUCH 100 UNITS: 100 | 25 days supply | Qty: 15 | Fill #1

## 2019-03-13 MED FILL — DULOXETINE HCL 60 MG CPEP: 60 | 90 days supply | Qty: 90 | Fill #0

## 2019-03-13 MED FILL — JANUVIA 100 MG TABLET: 100 | 30 days supply | Qty: 30 | Fill #2

## 2019-03-13 MED FILL — ARIPIPRAZOLE 5 MG TABS: 5 | 90 days supply | Qty: 90 | Fill #0

## 2019-03-29 DIAGNOSIS — R5381 Other malaise: Secondary | ICD-10-CM | POA: Diagnosis not present

## 2019-03-29 DIAGNOSIS — Z1159 Encounter for screening for other viral diseases: Secondary | ICD-10-CM | POA: Diagnosis not present

## 2019-04-17 MED FILL — JANUVIA 100 MG TABLET: 100 | 30 days supply | Qty: 30 | Fill #3

## 2019-04-19 ENCOUNTER — Other Ambulatory Visit: Payer: Self-pay | Admitting: *Deleted

## 2019-04-19 DIAGNOSIS — Z794 Long term (current) use of insulin: Secondary | ICD-10-CM

## 2019-04-19 DIAGNOSIS — E1165 Type 2 diabetes mellitus with hyperglycemia: Secondary | ICD-10-CM

## 2019-04-19 MED ORDER — FREESTYLE LITE TEST VI STRP: ORAL_STRIP | 12 refills | Status: DC

## 2019-04-19 MED ORDER — MONTELUKAST SODIUM 10 MG PO TABS: 10.0000 mg | ORAL_TABLET | Freq: Every day | ORAL | 3 refills | Status: DC

## 2019-05-01 MED FILL — FREESTYLE LITE TEST STRIP: 90 days supply | Qty: 100 | Fill #0

## 2019-05-08 MED FILL — LEVEMIR FLEXTOUCH 100 UNITS: 100 | 25 days supply | Qty: 15 | Fill #0

## 2019-05-08 MED FILL — MONTELUKAST SOD 10 MG TAB: 10 | 90 days supply | Qty: 90 | Fill #0

## 2019-05-08 MED FILL — HUMALOG 100 UNITS/ML KWIKPE: 100 | 80 days supply | Qty: 12 | Fill #0

## 2019-05-08 MED FILL — FREESTYLE LITE TEST STRIP: 30 days supply | Qty: 100 | Fill #0

## 2019-05-10 ENCOUNTER — Other Ambulatory Visit: Payer: Self-pay | Admitting: *Deleted

## 2019-05-10 MED ORDER — GABAPENTIN 300 MG PO CAPS: 300.0000 mg | ORAL_CAPSULE | Freq: Every day | ORAL | 3 refills | Status: DC

## 2019-05-10 MED ORDER — FLUTICASONE-SALMETEROL 250-50 MCG/DOSE IN AEPB: 1.0000 | INHALATION_SPRAY | Freq: Two times a day (BID) | RESPIRATORY_TRACT | 3 refills | Status: DC

## 2019-05-11 MED FILL — ADVAIR 250/50 DISKUS: 250-50 | 90 days supply | Qty: 180 | Fill #0

## 2019-05-11 MED FILL — GABAPENTIN 300 MG CAPSULE: 300 | 90 days supply | Qty: 90 | Fill #0

## 2019-05-12 ENCOUNTER — Other Ambulatory Visit: Payer: Self-pay

## 2019-05-13 MED ORDER — METFORMIN HCL 1000 MG PO TABS: 1000.0000 mg | ORAL_TABLET | Freq: Two times a day (BID) | ORAL | 3 refills | Status: DC

## 2019-05-13 NOTE — Telephone Encounter (Signed)
Please have patient make follow up appointment for diabetes check and A1C

## 2019-05-15 MED FILL — metFORMIN HCL 1000 MG TABS: 1000 | 90 days supply | Qty: 180 | Fill #0

## 2019-05-25 MED FILL — JANUVIA 100 MG TABLET: 100 | 30 days supply | Qty: 30 | Fill #0

## 2019-06-02 ENCOUNTER — Other Ambulatory Visit: Payer: Self-pay

## 2019-06-05 MED ORDER — MIRTAZAPINE 30 MG PO TABS: 30.0000 mg | ORAL_TABLET | Freq: Every day | ORAL | 0 refills | Status: DC

## 2019-06-05 MED FILL — MIRTAZAPINE 30 MG TABLET: 30 | 90 days supply | Qty: 90 | Fill #0

## 2019-06-13 ENCOUNTER — Other Ambulatory Visit: Payer: Self-pay

## 2019-06-13 ENCOUNTER — Telehealth (INDEPENDENT_AMBULATORY_CARE_PROVIDER_SITE_OTHER): Payer: 59 | Admitting: Family Medicine

## 2019-06-13 DIAGNOSIS — N5089 Other specified disorders of the male genital organs: Secondary | ICD-10-CM | POA: Insufficient documentation

## 2019-06-13 NOTE — Assessment & Plan Note (Addendum)
Condition could not be properly assessed over telemedicine.  [place] office for earliest office appointment and patient has an appointment tomorrow 06/14/2019 @2 :20pm with Dr. Zettie Cooley. Patient given instructions over the phone of seeking care at nearest ED or urgent care center sooner if symptoms worsen.

## 2019-06-13 NOTE — Progress Notes (Signed)
[facility] Telemedicine Visit  Patient consented to have virtual visit. Method of visit: Telephone  Encounter participants: Patient: Christopher Fernandez - located at home Provider: Gerlene Fee - located at home office  Chief Complaint: scrotal lump  HPI: Mr. Christopher Fernandez noticed a hard shiny discolored irregular lump on his right scrotum Friday.It is not painful but has some discomfort with a stinging sensation. It is the size of a baseball and is gradually increasing. Associated symptoms are night sweats, fatigue, feeling warm, chills, and sweating. Sunday morning took tylenol and ibuprofen with some relief. 99.0 F temp last night. Personal thermometer is no longer working today. He has had suspected fevers but not measured adequately at home but with hot and cold spells and myalgias. Associated headache.   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  Does not endorse any problems starting a urine stream or emptying bladder. Baseline erectile dysfunction unchanged from his normal.  No SOB, congestion, chest pain chest tightness.   No personal or family history of cancer, testicular or prostate.    ROS: per HPI  Pertinent PMHx: Patient Active Problem List   Diagnosis Date Noted  . Exocrine pancreatic insufficiency 06/14/2018  . Chronic pain of left knee 05/03/2018  . Uncontrolled diabetes mellitus with hyperglycemia, with long-term current use of insulin ([place]) 02/24/2018  . Alcohol use disorder, severe, dependence ([place]) 02/24/2018  . Anxiety associated with depression 02/24/2018  . GERD (gastroesophageal reflux disease) 02/24/2018  . Anemia 02/24/2018  . Asthma, intermittent 02/24/2018  . Tobacco use disorder 02/24/2018   Necrotizing fasciitis and gangrene (patient reported)  Exam:   Unable to assess due to nature of the  visit   Assessment/Plan:  Scrotum swelling Condition could not be properly assessed over telemedicine.  [facility] office for earliest office appointment and patient has an appointment tomorrow 06/14/2019 @2 :20pm with Dr. Zettie Cooley. Patient given instructions over the phone of seeking care at nearest ED or urgent care center sooner if symptoms worsen.     Time spent during visit with patient: 15 minutes

## 2019-06-14 ENCOUNTER — Other Ambulatory Visit: Payer: Self-pay

## 2019-06-14 ENCOUNTER — Encounter (HOSPITAL_COMMUNITY): Payer: Self-pay

## 2019-06-14 ENCOUNTER — Emergency Department (HOSPITAL_COMMUNITY)
Admission: EM | Admit: 2019-06-14 | Discharge: 2019-06-14 | Disposition: A | Discharge status: Home / Self Care | Payer: 59 | Attending: Emergency Medicine | Admitting: Emergency Medicine

## 2019-06-14 ENCOUNTER — Ambulatory Visit: Payer: 59 | Admitting: Family Medicine

## 2019-06-14 DIAGNOSIS — Z79899 Other long term (current) drug therapy: Secondary | ICD-10-CM | POA: Diagnosis not present

## 2019-06-14 DIAGNOSIS — N5082 Scrotal pain: Secondary | ICD-10-CM | POA: Diagnosis present

## 2019-06-14 DIAGNOSIS — J449 Chronic obstructive pulmonary disease, unspecified: Secondary | ICD-10-CM | POA: Insufficient documentation

## 2019-06-14 DIAGNOSIS — I10 Essential (primary) hypertension: Secondary | ICD-10-CM | POA: Insufficient documentation

## 2019-06-14 DIAGNOSIS — E119 Type 2 diabetes mellitus without complications: Secondary | ICD-10-CM | POA: Diagnosis not present

## 2019-06-14 DIAGNOSIS — N492 Inflammatory disorders of scrotum: Secondary | ICD-10-CM | POA: Insufficient documentation

## 2019-06-14 DIAGNOSIS — Z794 Long term (current) use of insulin: Secondary | ICD-10-CM | POA: Diagnosis not present

## 2019-06-14 DIAGNOSIS — F17210 Nicotine dependence, cigarettes, uncomplicated: Secondary | ICD-10-CM | POA: Diagnosis not present

## 2019-06-14 LAB — CBC WITH DIFFERENTIAL/PLATELET
Abs Immature Granulocytes: 0.06 10*3/uL (ref 0.00–0.07)
Basophils Absolute: 0 10*3/uL (ref 0.0–0.1)
Basophils Relative: 0 %
Eosinophils Absolute: 0.1 10*3/uL (ref 0.0–0.5)
Eosinophils Relative: 1 %
HCT: 36.7 % — ABNORMAL LOW (ref 39.0–52.0)
Hemoglobin: 12.4 g/dL — ABNORMAL LOW (ref 13.0–17.0)
Immature Granulocytes: 1 %
Lymphocytes Relative: 10 %
Lymphs Abs: 1.2 10*3/uL (ref 0.7–4.0)
MCH: 29.5 pg (ref 26.0–34.0)
MCHC: 33.8 g/dL (ref 30.0–36.0)
MCV: 87.2 fL (ref 80.0–100.0)
Monocytes Absolute: 1.1 10*3/uL — ABNORMAL HIGH (ref 0.1–1.0)
Monocytes Relative: 10 %
Neutro Abs: 9.5 10*3/uL — ABNORMAL HIGH (ref 1.7–7.7)
Neutrophils Relative %: 78 %
Platelets: 266 10*3/uL (ref 150–400)
RBC: 4.21 MIL/uL — ABNORMAL LOW (ref 4.22–5.81)
RDW: 14.1 % (ref 11.5–15.5)
WBC: 12 10*3/uL — ABNORMAL HIGH (ref 4.0–10.5)
nRBC: 0 % (ref 0.0–0.2)

## 2019-06-14 LAB — LACTIC ACID, PLASMA: Lactic Acid, Venous: 1.9 mmol/L (ref 0.5–1.9)

## 2019-06-14 LAB — BASIC METABOLIC PANEL
Anion gap: 13 (ref 5–15)
BUN: 9 mg/dL (ref 6–20)
CO2: 22 mmol/L (ref 22–32)
Calcium: 8.5 mg/dL — ABNORMAL LOW (ref 8.9–10.3)
Chloride: 96 mmol/L — ABNORMAL LOW (ref 98–111)
Creatinine, Ser: 0.94 mg/dL (ref 0.61–1.24)
GFR calc Af Amer: >60 mL/min (ref >60)
GFR calc non Af Amer: >60 mL/min (ref >60)
Glucose, Bld: 356 mg/dL — ABNORMAL HIGH (ref 70–99)
Potassium: 4 mmol/L (ref 3.5–5.1)
Sodium: 131 mmol/L — ABNORMAL LOW (ref 135–145)

## 2019-06-14 LAB — PROTIME-INR
INR: 1 (ref 0.8–1.2)
Prothrombin Time: 13.1 seconds (ref 11.4–15.2)

## 2019-06-14 MED ORDER — CIPROFLOXACIN HCL 500 MG PO TABS: 500.0000 mg | ORAL_TABLET | Freq: Two times a day (BID) | ORAL | 0 refills | Status: DC

## 2019-06-14 MED ORDER — LIDOCAINE-EPINEPHRINE (PF) 2 %-1:200000 IJ SOLN
20.0000 mL | Freq: Once | INTRAMUSCULAR | Status: DC
  Filled 2019-06-14: qty 20

## 2019-06-14 MED ORDER — PIPERACILLIN-TAZOBACTAM 3.375 G IVPB
3.3750 g | Freq: Once | INTRAVENOUS | Status: DC
  Filled 2019-06-14: qty 50

## 2019-06-14 MED ORDER — VANCOMYCIN HCL 10 G IV SOLR
2000.0000 mg | Freq: Once | INTRAVENOUS | Status: DC
  Filled 2019-06-14: qty 2000

## 2019-06-14 MED ORDER — SODIUM CHLORIDE 0.9 % IV BOLUS
1000.0000 mL | Freq: Once | INTRAVENOUS | Status: AC
  Administered 2019-06-14: 1000 mL via INTRAVENOUS

## 2019-06-14 MED ORDER — CLINDAMYCIN HCL 300 MG PO CAPS: 300.0000 mg | ORAL_CAPSULE | Freq: Four times a day (QID) | ORAL | 0 refills | Status: DC

## 2019-06-14 MED ORDER — VANCOMYCIN HCL 10 G IV SOLR: 1500.0000 mg | Freq: Two times a day (BID) | INTRAVENOUS | Status: DC

## 2019-06-14 MED ORDER — PIPERACILLIN-TAZOBACTAM 3.375 G IVPB 30 MIN
3.3750 g | Freq: Once | INTRAVENOUS | Status: AC
  Administered 2019-06-14: 3.375 g via INTRAVENOUS

## 2019-06-14 MED FILL — CIPROFLOXACIN HCL 500 MG TA: 500 | 7 days supply | Qty: 14 | Fill #0

## 2019-06-14 MED FILL — CLINDAMYCIN HCL 300 MG CAPS: 300 | 7 days supply | Qty: 28 | Fill #0

## 2019-06-14 NOTE — Progress Notes (Signed)
Pharmacy Antibiotic Note  Christopher Fernandez is a 48 y.o. male admitted on 06/14/2019 with wound infection. Pharmacy has been consulted for vancomycin dosing. Pt is afebrile and WBC is elevated at 12. SCr is WNL at 0.94.   Plan: Vancomycin 2gm IV x 1 then 1500mg  IV Q12H F/u renal fxn, C&S, clinical status and peak/trough at [facility] F/u continuation of zosyn or other gram negative coverage  Height: 6\' 3"  (190.5 cm) Weight: 275 lb (124.7 kg) IBW/kg (Calculated) : 84.5  Temp (24hrs), Avg:98.6 F (37 C), Min:98.6 F (37 C), Max:98.6 F (37 C)  No results for input(s): WBC, CREATININE, LATICACIDVEN, VANCOTROUGH, VANCOPEAK, VANCORANDOM, GENTTROUGH, GENTPEAK, GENTRANDOM, TOBRATROUGH, TOBRAPEAK, TOBRARND, AMIKACINPEAK, AMIKACINTROU, AMIKACIN in the last 168 hours.  CrCl cannot be calculated (Patient's most recent lab result is older than the maximum 21 days allowed.).    Allergies  Allergen Reactions  . Morphine And Related     Cant take because of pancreatitis    Antimicrobials this admission: Vanc 9/30>> Zosyn x 1 9/30  Dose adjustments this admission: N/A  Microbiology results: Pending  Thank you for allowing pharmacy to be a part of this patient's care.  Rumbarger, Rande Lawman 06/14/2019 10:22 AM

## 2019-06-14 NOTE — ED Notes (Signed)
Lesion on scrotum, history of infection in the same area

## 2019-06-14 NOTE — ED Triage Notes (Signed)
Patient states he woke up this morning around 0630 to find fluid and blood on his bed. The patient states he has pain and swelling in his groin area. The patient states he is not in pain at this time.

## 2019-06-14 NOTE — Consult Note (Signed)
Urology Consult   Physician requesting consult: Dr. Aletta Edouard  Reason for consult: Scrotal abscess  History of Present Illness: Christopher Fernandez is a 48 y.o. diabetic with a history of prior scrotal infection/possible Fournier's gangrene about 4-5 years ago.  He developed a mass in his right scrotum about 5 days ago and then developed fever and scrotal pain.  He was scheduled to see a PCP today but developed profuse drainage with purulent material from the scrotum and presented to the ED for further evaluation today.  He has been febrile up to 102.  Denies COVID exposure.  Glucose levels have been well controlled per patient.  Now with relief after scrotal drainage.  Pain improved.    Past Medical History:  Diagnosis Date  . Alcoholism ([place])   . Anemia   . Anxiety   . Asthma   . COPD (chronic obstructive pulmonary disease) ([place])   . Depression   . Diabetes ([place])   . HTN (hypertension)   . Pancreatitis   . Pneumonia     Past Surgical History:  Procedure Laterality Date  . 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 APPENDECTOMY    . CHOLECYSTECTOMY  2006   with gallbladder and spleen  . HERNIA REPAIR  2008, 2010   hiatal hernia and 1 additional  . PANCREATIC PSEUDOCYST DRAINAGE    . SPLENECTOMY  2006  . TOTAL KNEE ARTHROPLASTY Right 1982, 1984    Medications:  Home meds:  No current facility-administered medications on file prior to encounter.    Current Outpatient Medications on File Prior to Encounter  Medication Sig Dispense Refill  . albuterol (PROVENTIL HFA;VENTOLIN HFA) 108 (90 Base) MCG/ACT inhaler Inhale 2-4 puffs into the lungs every 4 (four) hours as needed for wheezing (or cough). (Patient not taking: Reported on 01/06/2019) 2 Inhaler 2  . ARIPiprazole (ABILIFY) 5 MG tablet TAKE 1 TABLET BY MOUTH DAILY. 90 tablet 0  . DULoxetine (CYMBALTA) 60 MG capsule TAKE 1 CAPSULE BY MOUTH DAILY. 90 capsule 0  . EASY TOUCH PEN NEEDLES 31G X 5 MM MISC Inject 1  application as directed 3 (three) times daily. 100 each 11  . Fluticasone-Salmeterol (ADVAIR DISKUS) 250-50 MCG/DOSE AEPB Inhale 1 puff into the lungs 2 (two) times daily. 180 each 3  . gabapentin (NEURONTIN) 300 MG capsule Take 1 capsule (300 mg total) by mouth at bedtime. 90 capsule 3  . glucose blood (FREESTYLE LITE) test strip Use as instructed 100 each 12  . HUMALOG KWIKPEN 100 UNIT/ML KwikPen INJECT 5 UNITS TOTAL INTO THE SKIN 3 TIMES DAILY. 12 mL 0  . Insulin Detemir (LEVEMIR) 100 UNIT/ML Pen Inject 60 Units into the skin daily at 10 pm. 15 mL 11  . Lancets (FREESTYLE) lancets Use as instructed 100 each 12  . lipase/protease/amylase (CREON) 36000 UNITS CPEP capsule Take 2 capsules (72,000 Units total) by mouth 3 (three) times daily with meals. Two capsules with a meal, one with a snack (Patient not taking: Reported on 01/06/2019) 270 capsule 1  . metFORMIN (GLUCOPHAGE) 1000 MG tablet Take 1 tablet (1,000 mg total) by mouth 2 (two) times daily with a meal. 180 tablet 3  . mirtazapine (REMERON) 30 MG tablet Take 1 tablet (30 mg total) by mouth at bedtime. 90 tablet 0  . montelukast (SINGULAIR) 10 MG tablet Take 1 tablet (10 mg total) by mouth at bedtime. 90 tablet 3  . sitaGLIPtin (JANUVIA) 100 MG tablet Take 1 tablet (100 mg total) by mouth  daily. 90 tablet 3  . traZODone (DESYREL) 50 MG tablet Take 2 tablets (100 mg total) by mouth at bedtime as needed for sleep. (Patient not taking: Reported on 01/06/2019) 180 tablet 3     Scheduled Meds: . lidocaine-EPINEPHrine  20 mL Infiltration Once   Continuous Infusions: . vancomycin     PRN Meds:.  Allergies:  Allergies  Allergen Reactions  . Morphine And Related     Cant take because of pancreatitis    Family History  Problem Relation Age of Onset  . Diabetes Mother   . Hypertension Mother   . Hyperlipidemia Mother   . Kidney disease Mother   . Thyroid disease Mother   . Breast cancer Mother        mets  . Lung cancer Mother   .  Diabetes Father   . Alcohol abuse Sister   . Sickle cell trait Sister   . Diabetes Brother   . Asthma Brother   . Lung cancer Maternal Grandmother   . Kidney disease Maternal Grandmother   . Liver cancer Maternal Uncle        x 4-5  . Kidney disease Maternal Uncle   . Kidney disease Paternal Uncle     Social History:  reports that he has been smoking. He has been smoking about 0.50 packs per day. He has never used smokeless tobacco. He reports current alcohol use. No history on file for drug.  ROS: A complete review of systems was performed.  All systems are negative except for pertinent findings as noted.  Physical Exam:  Vital signs in last 24 hours: Temp:  [98.6 F (37 C)] 98.6 F (37 C) (09/30 0939) Pulse Rate:  [85-102] 88 (09/30 1258) Resp:  [16] 16 (09/30 1258) BP: (115-155)/(73-92) 116/76 (09/30 1258) SpO2:  [94 %-99 %] 99 % (09/30 1258) Weight:  [124.7 kg] 124.7 kg (09/30 1021) Constitutional:  Alert and oriented, No acute distress Cardiovascular: Regular rate and rhythm, No JVD Respiratory: Normal respiratory effort GI: Abdomen is soft, nontender, nondistended, no abdominal masses Genitourinary: No CVAT. Normal male phallus, testes are descended bilaterally.  Normal left testis.  Right hemiscrotum with large matted indurated area with three separate open areas. Able to express some purulent material but appears to be have completely drained.  No crepitus or evidence of necrotizing infection. Lymphatic: No lymphadenopathy Neurologic: Grossly intact, no focal deficits Psychiatric: Normal mood and affect  Laboratory Data:  Recent Labs    06/14/19 1025  WBC 12.0*  HGB 12.4*  HCT 36.7*  PLT 266    Recent Labs    06/14/19 1025  NA 131*  K 4.0  CL 96*  GLUCOSE 356*  BUN 9  CALCIUM 8.5*  CREATININE 0.94     Results for orders placed or performed during the hospital encounter of 06/14/19 (from the past 24 hour(s))  Basic metabolic panel     Status:  Abnormal   Collection Time: 06/14/19 10:25 AM  Result Value Ref Range   Sodium 131 (L) 135 - 145 mmol/L   Potassium 4.0 3.5 - 5.1 mmol/L   Chloride 96 (L) 98 - 111 mmol/L   CO2 22 22 - 32 mmol/L   Glucose, Bld 356 (H) 70 - 99 mg/dL   BUN 9 6 - 20 mg/dL   Creatinine, Ser 0.94 0.61 - 1.24 mg/dL   Calcium 8.5 (L) 8.9 - 10.3 mg/dL   GFR calc non Af Amer >60 >60 mL/min   GFR calc Af Amer >60 >60  mL/min   Anion gap 13 5 - 15  CBC with Differential     Status: Abnormal   Collection Time: 06/14/19 10:25 AM  Result Value Ref Range   WBC 12.0 (H) 4.0 - 10.5 K/uL   RBC 4.21 (L) 4.22 - 5.81 MIL/uL   Hemoglobin 12.4 (L) 13.0 - 17.0 g/dL   HCT 36.7 (L) 39.0 - 52.0 %   MCV 87.2 80.0 - 100.0 fL   MCH 29.5 26.0 - 34.0 pg   MCHC 33.8 30.0 - 36.0 g/dL   RDW 14.1 11.5 - 15.5 %   Platelets 266 150 - 400 K/uL   nRBC 0.0 0.0 - 0.2 %   Neutrophils Relative % 78 %   Neutro Abs 9.5 (H) 1.7 - 7.7 K/uL   Lymphocytes Relative 10 %   Lymphs Abs 1.2 0.7 - 4.0 K/uL   Monocytes Relative 10 %   Monocytes Absolute 1.1 (H) 0.1 - 1.0 K/uL   Eosinophils Relative 1 %   Eosinophils Absolute 0.1 0.0 - 0.5 K/uL   Basophils Relative 0 %   Basophils Absolute 0.0 0.0 - 0.1 K/uL   Immature Granulocytes 1 %   Abs Immature Granulocytes 0.06 0.00 - 0.07 K/uL  Protime-INR     Status: None   Collection Time: 06/14/19 10:25 AM  Result Value Ref Range   Prothrombin Time 13.1 11.4 - 15.2 seconds   INR 1.0 0.8 - 1.2  Lactic acid, plasma     Status: None   Collection Time: 06/14/19 10:25 AM  Result Value Ref Range   Lactic Acid, Venous 1.9 0.5 - 1.9 mmol/L   No results found for this or any previous visit (from the past 240 hour(s)).  Renal Function: Recent Labs    06/14/19 1025  CREATININE 0.94   Estimated Creatinine Clearance: 133.8 mL/min (by C-G formula based on SCr of 0.94 mg/dL).   Impression/Recommendation Scrotal abscess: Patient appears to have had spontaneous drainage of abscess.  No indication for  acute surgical drainage.  Will need broad spectrum antibiotic therapy with gram positive, gram negative, and anaerobic coverage (i.e. clindamycin and ciprofloxacin).  Culture is pending from wound.  Will have f/u in our office next week for further evaluation.  He has been instructed to call if he develops worsening fever curve or increasing pain.   Dutch Gray 06/14/2019, 1:07 PM    Pryor Curia MD  CC: Dr. Aletta Edouard

## 2019-06-14 NOTE — Discharge Instructions (Signed)
You were seen in the emergency department for evaluation of drainage and abscess of your scrotum.  You had blood work and were given IV antibiotics.  Urology Dr. Alinda Money saw you here and felt that the abscess was drained fairly well on its own.  You should continue to do some sits baths and we are prescribing 2 antibiotics to take.  Please follow-up with Dr. Alinda Money at [facility] urology.  If you have high fevers or worsening symptoms please return to the emergency department.

## 2019-06-14 NOTE — ED Provider Notes (Signed)
[facility] EMERGENCY DEPARTMENT Provider Note   CSN: [id number] Arrival date & time: 06/14/19  0935     History   Chief Complaint Chief Complaint  Patient presents with  . Groin Swelling    HPI Christopher Fernandez is a 48 y.o. male.  He has a history of diabetes hypertension.  He said he also has a history of Fournier's gangrene that required surgery back in [place].  He is complaining of scrotal mass that began a few days ago he said it started a size of a quarter and then was a size of a baseball.  He has had fevers and chills and body aches.  He said he had a virtual visit with his primary care doctor and he was felt to follow-up with somebody.  Overnight he said he woke up with a wet sensation and realize that it had been draining some foul bloody fluid.  He says he actually feels better today but wanted to get it checked out.  No cough no chest pain no sore throat.  No known Covid contacts.     The history is provided by the patient.  Male GU Problem Presenting symptoms: scrotal pain and swelling   Presenting symptoms: no dysuria   Relieved by:  Nothing Worsened by:  Tactile pressure Ineffective treatments:  None tried Associated symptoms: fever and scrotal swelling   Associated symptoms: no abdominal pain, no hematuria, no penile redness, no urinary frequency and no vomiting     Past Medical History:  Diagnosis Date  . Alcoholism ([place])   . Anemia   . Anxiety   . Asthma   . COPD (chronic obstructive pulmonary disease) ([place])   . Depression   . Diabetes ([place])   . HTN (hypertension)   . Pancreatitis   . Pneumonia     Patient Active Problem List   Diagnosis Date Noted  . Scrotum swelling 06/13/2019  . Exocrine pancreatic insufficiency 06/14/2018  . Chronic pain of left knee 05/03/2018  . Uncontrolled diabetes mellitus with hyperglycemia, with long-term current use of insulin ([place]) 02/24/2018  . Alcohol use disorder, severe, dependence ([place])  02/24/2018  . Anxiety associated with depression 02/24/2018  . GERD (gastroesophageal reflux disease) 02/24/2018  . Anemia 02/24/2018  . Asthma, intermittent 02/24/2018  . Tobacco use disorder 02/24/2018    Past Surgical History:  Procedure Laterality Date  . 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 APPENDECTOMY    . CHOLECYSTECTOMY  2006   with gallbladder and spleen  . HERNIA REPAIR  2008, 2010   hiatal hernia and 1 additional  . PANCREATIC PSEUDOCYST DRAINAGE    . SPLENECTOMY  2006  . TOTAL KNEE ARTHROPLASTY Right 1982, 1984        Home Medications    Prior to Admission medications   Medication Sig Start Date End Date Taking? Authorizing Provider  albuterol (PROVENTIL HFA;VENTOLIN HFA) 108 (90 Base) MCG/ACT inhaler Inhale 2-4 puffs into the lungs every 4 (four) hours as needed for wheezing (or cough). Patient not taking: Reported on 01/06/2019 06/14/18   Jud Bing, DO  ARIPiprazole (ABILIFY) 5 MG tablet TAKE 1 TABLET BY MOUTH DAILY. 03/13/19   [place] Bing, DO  DULoxetine (CYMBALTA) 60 MG capsule TAKE 1 CAPSULE BY MOUTH DAILY. 03/13/19   [place] Bing, DO  EASY TOUCH PEN NEEDLES 31G X 5 MM MISC Inject 1 application as directed 3 (three) times daily. 09/02/18   [place] Bing, DO  Fluticasone-Salmeterol (ADVAIR  DISKUS) 250-50 MCG/DOSE AEPB Inhale 1 puff into the lungs 2 (two) times daily. 05/10/19   Mullis, Kiersten P, DO  gabapentin (NEURONTIN) 300 MG capsule Take 1 capsule (300 mg total) by mouth at bedtime. 05/10/19   Mullis, Kiersten P, DO  glucose blood (FREESTYLE LITE) test strip Use as instructed 04/19/19   Mullis, Kiersten P, DO  HUMALOG KWIKPEN 100 UNIT/ML KwikPen INJECT 5 UNITS TOTAL INTO THE SKIN 3 TIMES DAILY. 12/26/18   [place] Bing, DO  Insulin Detemir (LEVEMIR) 100 UNIT/ML Pen Inject 60 Units into the skin daily at 10 pm. 09/02/18   [place] Bing, DO  Lancets (FREESTYLE) lancets Use as instructed 03/29/18   [place] Bing, DO   lipase/protease/amylase (CREON) 36000 UNITS CPEP capsule Take 2 capsules (72,000 Units total) by mouth 3 (three) times daily with meals. Two capsules with a meal, one with a snack Patient not taking: Reported on 01/06/2019 05/26/18   Doran Stabler, MD  metFORMIN (GLUCOPHAGE) 1000 MG tablet Take 1 tablet (1,000 mg total) by mouth 2 (two) times daily with a meal. 05/13/19   Mullis, Kiersten P, DO  mirtazapine (REMERON) 30 MG tablet Take 1 tablet (30 mg total) by mouth at bedtime. 06/05/19   Mullis, Kiersten P, DO  montelukast (SINGULAIR) 10 MG tablet Take 1 tablet (10 mg total) by mouth at bedtime. 04/19/19   Mullis, Kiersten P, DO  sitaGLIPtin (JANUVIA) 100 MG tablet Take 1 tablet (100 mg total) by mouth daily. 06/14/18   [place] Bing, DO  traZODone (DESYREL) 50 MG tablet Take 2 tablets (100 mg total) by mouth at bedtime as needed for sleep. Patient not taking: Reported on 01/06/2019 02/24/18   [place] Bing, DO    Family History Family History  Problem Relation Age of Onset  . Diabetes Mother   . Hypertension Mother   . Hyperlipidemia Mother   . Kidney disease Mother   . Thyroid disease Mother   . Breast cancer Mother        mets  . Lung cancer Mother   . Diabetes Father   . Alcohol abuse Sister   . Sickle cell trait Sister   . Diabetes Brother   . Asthma Brother   . Lung cancer Maternal Grandmother   . Kidney disease Maternal Grandmother   . Liver cancer Maternal Uncle        x 4-5  . Kidney disease Maternal Uncle   . Kidney disease Paternal Uncle     Social History Social History   Tobacco Use  . Smoking status: Current Every Day Smoker    Packs/day: 0.50  . Smokeless tobacco: Never Used  Substance Use Topics  . Alcohol use: Yes    Comment: 2-3 per day  . Drug use: Not on file     Allergies   Morphine and related   Review of Systems Review of Systems  Constitutional: Positive for chills, fatigue and fever.  HENT: Negative for sore throat.   Eyes:  Negative for visual disturbance.  Respiratory: Negative for shortness of breath.   Cardiovascular: Negative for chest pain.  Gastrointestinal: Negative for abdominal pain and vomiting.  Genitourinary: Positive for scrotal swelling. Negative for dysuria, frequency and hematuria.  Musculoskeletal: Negative for neck pain.  Skin: Positive for wound. Negative for rash.  Neurological: Negative for headaches.     Physical Exam Updated Vital Signs BP (!) 155/89 (BP Location: Left Arm)   Pulse (!) 102   Temp 98.6 F (37 C) (  Oral)   Resp 16   SpO2 94%   Physical Exam Vitals signs and nursing note reviewed.  Constitutional:      Appearance: He is well-developed.  HENT:     Head: Normocephalic and atraumatic.  Eyes:     Conjunctiva/sclera: Conjunctivae normal.  Neck:     Musculoskeletal: Neck supple.  Cardiovascular:     Rate and Rhythm: Regular rhythm. Tachycardia present.     Pulses: Normal pulses.     Heart sounds: No murmur.  Pulmonary:     Effort: Pulmonary effort is normal. No respiratory distress.     Breath sounds: Normal breath sounds.  Abdominal:     Palpations: Abdomen is soft.     Tenderness: There is no abdominal tenderness.  Genitourinary:    Penis: Normal.      Scrotum/Testes:        Right: Mass, tenderness and swelling present.     Comments: Patient has some erythema over his right hemiscrotum in an area that looks like it had opened up.  There is some thin foul discharge in that area.  There is still some induration possibly some areas of loculation.  His perineum and inner thigh are soft and nontender. Musculoskeletal: Normal range of motion.  Skin:    General: Skin is warm and dry.  Neurological:     Mental Status: He is alert.      ED Treatments / Results  Labs (all labs ordered are listed, but only abnormal results are displayed) Labs Reviewed  BASIC METABOLIC PANEL - Abnormal; Notable for the following components:      Result Value   Sodium 131  (*)    Chloride 96 (*)    Glucose, Bld 356 (*)    Calcium 8.5 (*)    All other components within normal limits  CBC WITH DIFFERENTIAL/PLATELET - Abnormal; Notable for the following components:   WBC 12.0 (*)    RBC 4.21 (*)    Hemoglobin 12.4 (*)    HCT 36.7 (*)    Neutro Abs 9.5 (*)    Monocytes Absolute 1.1 (*)    All other components within normal limits  AEROBIC CULTURE (SUPERFICIAL SPECIMEN)  CULTURE, BLOOD (ROUTINE X 2)  CULTURE, BLOOD (ROUTINE X 2)  PROTIME-INR  LACTIC ACID, PLASMA    EKG None  Radiology No results found.  Procedures Procedures (including critical care time)  Medications Ordered in ED Medications  sodium chloride 0.9 % bolus 1,000 mL (0 mLs Intravenous Stopped 06/14/19 1312)  piperacillin-tazobactam (ZOSYN) IVPB 3.375 g (0 g Intravenous Stopped 06/14/19 1314)     Initial Impression / Assessment and Plan / ED Course  I have reviewed the triage vital signs and the nursing notes.  Pertinent labs & imaging results that were available during my care of the patient were reviewed by me and considered in my medical decision making (see chart for details).  Clinical Course as of Jun 13 1914  Wed Jun 13, 1722  2238 48 year old diabetic here with scrotal abscess for 4 to 5 days nondraining also with systemic symptoms of fevers chills body aches.  He is slightly tachycardic here although afebrile.  He is getting lab work including blood cultures and lactic acid getting some fluids and antibiotics.  I also placed a call into urology for recommendations on imaging.   [MB]  1024 Discussed with Dr. Alinda Money from urology.  He agrees with current work-up of lab work and to get a wound culture and he is saying to  hold off on imaging for now if it appears to be just isolated to the scrotum he will stop by and evaluate the patient.   [MB]  [id number] Patient was seen by Dr. Alinda Money from urology.  He does not feel this is necrotizing and that he can be safely discharged on oral  antibiotics and close follow-up with the urology department.  He is recommending Cipro and clindamycin.   [MB]  [id number] Patient is only received a Zosyn and not the vancomycin.  He said he would rather not take another IV dose and will just take the oral medications.   [MB]    Clinical Course User Index [MB] Hayden Rasmussen, MD        Final Clinical Impressions(s) / ED Diagnoses   Final diagnoses:  Scrotal abscess    ED Discharge Orders         Ordered    ciprofloxacin (CIPRO) 500 MG tablet  2 times daily     06/14/19 1255    clindamycin (CLEOCIN) 300 MG capsule  4 times daily     06/14/19 1255           Hayden Rasmussen, MD 06/14/19 [phone]

## 2019-06-14 NOTE — ED Notes (Signed)
Patient states he is feeling better and is ready to be discharged. D/c instructions were reviewed with the patient and education was successful as evidenced by the utilization of the teach-back method. Patient states he will get home via taxi.

## 2019-06-15 ENCOUNTER — Telehealth (HOSPITAL_BASED_OUTPATIENT_CLINIC_OR_DEPARTMENT_OTHER): Payer: Self-pay | Admitting: Emergency Medicine

## 2019-06-15 LAB — BLOOD CULTURE ID PANEL (REFLEXED)

## 2019-06-17 LAB — AEROBIC CULTURE W GRAM STAIN (SUPERFICIAL SPECIMEN): Special Requests: NORMAL

## 2019-06-17 LAB — CULTURE, BLOOD (ROUTINE X 2)

## 2019-06-18 ENCOUNTER — Telehealth: Payer: Self-pay | Admitting: Emergency Medicine

## 2019-06-18 NOTE — Telephone Encounter (Signed)
Post ED Visit - Positive Culture Follow-up  Culture report reviewed by antimicrobial stewardship pharmacist: [place] Team []  Elenor Quinones, Pharm.D. []  Heide Guile, Pharm.D., BCPS AQ-ID []  Parks Neptune, Pharm.D., BCPS []  Alycia Rossetti, Pharm.D., BCPS []  [place], [place].D., BCPS, AAHIVP []  Legrand Como, Pharm.D., BCPS, AAHIVP []  Salome Arnt, PharmD, BCPS []  Johnnette Gourd, PharmD, BCPS [x]  Hughes Better, PharmD, BCPS []  Leeroy Cha, PharmD []  Laqueta Linden, PharmD, BCPS []  Albertina Parr, PharmD  [place] Team []  Leodis Sias, PharmD []  Lindell Spar, PharmD []  Royetta Asal, PharmD []  Graylin Shiver, Rph []  Rema Fendt) Glennon Mac, PharmD []  Arlyn Dunning, PharmD []  Netta Cedars, PharmD []  Dia Sitter, PharmD []  Leone Haven, PharmD []  Gretta Arab, PharmD []  Theodis Shove, PharmD []  Peggyann Juba, PharmD []  Reuel Boom, PharmD   Positive aerobic culture Treated with Clindamycin and Ciprofloxacin, organism sensitive to the same and no further patient follow-up is required at this time.  Sandi Raveling Gammons 06/18/2019, 11:17 AM

## 2019-06-19 LAB — CULTURE, BLOOD (ROUTINE X 2): Culture: NO GROWTH

## 2019-06-20 DIAGNOSIS — N499 Inflammatory disorder of unspecified male genital organ: Secondary | ICD-10-CM | POA: Diagnosis not present

## 2019-06-22 MED FILL — FREESTYLE LITE TEST STRIP: 30 days supply | Qty: 100 | Fill #1

## 2019-06-23 ENCOUNTER — Other Ambulatory Visit: Payer: Self-pay

## 2019-06-23 DIAGNOSIS — Z794 Long term (current) use of insulin: Secondary | ICD-10-CM

## 2019-06-23 DIAGNOSIS — E1165 Type 2 diabetes mellitus with hyperglycemia: Secondary | ICD-10-CM

## 2019-06-24 MED ORDER — SITAGLIPTIN PHOSPHATE 100 MG PO TABS: 100.0000 mg | ORAL_TABLET | Freq: Every day | ORAL | 0 refills | Status: DC

## 2019-06-24 NOTE — Telephone Encounter (Signed)
I will provide 1 month refill, however it has been >1 year since he has been evaluated in office thus he will need to be seen for labs prior to further refills. Please have him schedule follow appointment at earliest convenience. Thank you.

## 2019-06-26 MED FILL — JANUVIA 100 MG TABLET: 100 | 30 days supply | Qty: 30 | Fill #0

## 2019-07-20 ENCOUNTER — Telehealth: Payer: Self-pay

## 2019-07-20 ENCOUNTER — Other Ambulatory Visit: Payer: Self-pay | Admitting: Family Medicine

## 2019-07-20 DIAGNOSIS — Z794 Long term (current) use of insulin: Secondary | ICD-10-CM

## 2019-07-20 DIAGNOSIS — E1165 Type 2 diabetes mellitus with hyperglycemia: Secondary | ICD-10-CM

## 2019-07-20 MED ORDER — INSULIN LISPRO (1 UNIT DIAL) 100 UNIT/ML (KWIKPEN): 6.0000 [IU] | PEN_INJECTOR | Freq: Three times a day (TID) | SUBCUTANEOUS | 0 refills | Status: DC

## 2019-07-20 MED FILL — HUMALOG 100 UNITS/ML KWIKPE: 100 | 50 days supply | Qty: 12 | Fill #0

## 2019-07-20 NOTE — Progress Notes (Signed)
Spoke to patient in regards to specific orders. He notes he is in need of Humalog Kwikpen. He notes he is taking 6-8 units three times a day. He has just a little bit left but is afraid he wont be able to make it through the weekend.   Refill for Humalog provided. Unclear if it will be covered by insurance due to Epic alerts. Informed patient to contact me if he has any issues.  Patient scheduled for follow up on 11/16. Recommend A1C at that time to follow up on Diabetes. Can consider addition of SGLT2 at that time. Patient is also past due on MANY health maintenance. Please have patient obtain:  diabetic eye exam  Diabetic foot exam  PNA, TDAP, and flu shot  Hepatitis C and HIV screening  He is also due for microalbumin:Cr ratio and BMP to monitor kidney function.   Please provide ambulatory referral for colonoscopy as well.

## 2019-07-20 NOTE — Telephone Encounter (Signed)
Patient calls nurse line stating he needs a refill on his insulin. Patient stated he has been doing 6-8 units before each meal, essentially 3x daily. Patient needs new prescription to reflect this. I informed him it has been over a year since we last saw him for diabetes. I scheduled him with team mid November. Please advise. He is out as of today.

## 2019-07-20 NOTE — Telephone Encounter (Signed)
See telephone encounter for details

## 2019-07-28 MED FILL — FREESTYLE LITE TEST STRIP: 30 days supply | Qty: 100 | Fill #2

## 2019-07-31 ENCOUNTER — Ambulatory Visit: Payer: 59 | Admitting: Family Medicine

## 2019-08-02 ENCOUNTER — Other Ambulatory Visit: Payer: Self-pay

## 2019-08-02 DIAGNOSIS — Z794 Long term (current) use of insulin: Secondary | ICD-10-CM

## 2019-08-02 DIAGNOSIS — E1165 Type 2 diabetes mellitus with hyperglycemia: Secondary | ICD-10-CM

## 2019-08-02 MED ORDER — ARIPIPRAZOLE 5 MG PO TABS: 5.0000 mg | ORAL_TABLET | Freq: Every day | ORAL | 0 refills | Status: DC

## 2019-08-02 MED ORDER — SITAGLIPTIN PHOSPHATE 100 MG PO TABS: 100.0000 mg | ORAL_TABLET | Freq: Every day | ORAL | 0 refills | Status: DC

## 2019-08-02 MED ORDER — DULOXETINE HCL 60 MG PO CPEP: 60.0000 mg | ORAL_CAPSULE | Freq: Every day | ORAL | 0 refills | Status: DC

## 2019-08-02 MED FILL — DULoxetine HCL 60 MG CPEP: 60 | 30 days supply | Qty: 30 | Fill #0

## 2019-08-02 MED FILL — ARIPiprazole 5 MG TABS: 5 | 30 days supply | Qty: 30 | Fill #0

## 2019-08-02 MED FILL — JANUVIA 100 MG TABLET: 100 | 30 days supply | Qty: 30 | Fill #0

## 2019-08-08 MED FILL — GABAPENTIN 300 MG CAPSULE: 300 | 90 days supply | Qty: 90 | Fill #1

## 2019-08-14 ENCOUNTER — Ambulatory Visit: Payer: 59 | Admitting: Family Medicine

## 2019-08-22 MED FILL — metFORMIN HCL 1000 MG TABS: 1000 | 90 days supply | Qty: 180 | Fill #1

## 2019-08-23 ENCOUNTER — Ambulatory Visit: Payer: 59 | Admitting: Family Medicine

## 2019-08-30 ENCOUNTER — Encounter: Payer: Self-pay | Admitting: Family Medicine

## 2019-08-30 ENCOUNTER — Ambulatory Visit: Payer: 59 | Admitting: Family Medicine

## 2019-08-30 ENCOUNTER — Other Ambulatory Visit: Payer: Self-pay

## 2019-08-30 VITALS — BP 122/74 | HR 91 | Ht 75.0 in | Wt 289.0 lb

## 2019-08-30 DIAGNOSIS — Z794 Long term (current) use of insulin: Secondary | ICD-10-CM

## 2019-08-30 DIAGNOSIS — E1165 Type 2 diabetes mellitus with hyperglycemia: Secondary | ICD-10-CM | POA: Diagnosis not present

## 2019-08-30 LAB — POCT GLYCOSYLATED HEMOGLOBIN (HGB A1C): HbA1c, POC (controlled diabetic range): 9.2 % — AB (ref 0.0–7.0)

## 2019-08-30 MED ORDER — INSULIN LISPRO (1 UNIT DIAL) 100 UNIT/ML (KWIKPEN): 0.0000 [IU] | PEN_INJECTOR | Freq: Three times a day (TID) | SUBCUTANEOUS | 0 refills | Status: DC

## 2019-08-30 MED ORDER — INSULIN DETEMIR 100 UNIT/ML FLEXPEN: 20.0000 [IU] | PEN_INJECTOR | Freq: Every day | SUBCUTANEOUS | 11 refills | Status: DC

## 2019-08-30 MED ORDER — GABAPENTIN 300 MG PO CAPS: 300.0000 mg | ORAL_CAPSULE | Freq: Two times a day (BID) | ORAL | 0 refills | Status: DC

## 2019-08-30 MED FILL — LEVEMIR FLEXTOUCH 100 UNITS: 100 | 75 days supply | Qty: 15 | Fill #0

## 2019-08-30 NOTE — Patient Instructions (Addendum)
It was a pleasure to see you today! Thank you for choosing [facility] for your primary care. Christopher Fernandez was seen for diabetes and depression discussion. Come back to the clinic in a week or 2 to discuss your diabetes adjustments.  Today we talked about your diabetes management.  Your A1c is now over 9 which is uncontrolled.  We also discussed that the amount of insulin you been taking is not consistent with what is in the computer chart.  Please write down an accurate record of what you took, when you took it, the blood sugar at that time, and what you were eating at each time.  That way we can adjust your medication appropriately.  Please call back with accurate records in a week or 2 so that we can adjust your medication appropriately  To be clear your new dose of long-term insulin is going to be 20 units at night. Your sliding scale of short acting insulin will be as follows: Less than 140: No insulin Between 140 and 180: 2 units short acting Between 180 and 240: 3 units short acting Between 240 and 300: 4 units short acting Between 300 and 350: 6 units short acting Between 350 and 400: 8 units of short acting Over 400: 10 units of short acting  If you find anything about the scale consistently gives you low blood sugars please call us for an immediate adjustment.  We also discussed your concern for diabetic neuropathy and depression symptoms.  Thankfully you say you are safe and not have any thoughts of harming yourself and your symptoms are minor.  We discussed that it would be best to just 1 medication at a time and he decided he would like to increase your daily gabapentin.  I will think this now twice per day as opposed to once per day.  I want to repeat that I think therapy would be the most appropriate additional treatment for you but I understand that at this time you do not think it would be helpful.   Please bring all your medications to every doctors visit   Sign up for  My Chart to have easy access to your labs results, and communication with your Primary care physician.     Please check-out at the front desk before leaving the clinic.     Best,  Dr. Sherene Sires FAMILY MEDICINE RESIDENT - PGY3 08/30/2019 11:18 AM

## 2019-08-30 NOTE — Progress Notes (Signed)
21 

## 2019-09-02 NOTE — Progress Notes (Signed)
Subjective:  Christopher Fernandez is a 48 y.o. male who presents to the [facility] today with a chief complaint of diabetic management.   HPI: Uncontrolled diabetes mellitus with hyperglycemia, with long-term current use of insulin ([place]) Patient with increased A1c to 9.2 up from last A1c of 7.8.  He is listed is taking 60 units of Lantus daily but says that he varies between 20 and 30 units because if he takes the 60 he ends up very low (50s-60s).  He says that he takes a sliding scale between 2 units and 8 units 3 times daily of short acting insulin but does not actually have a sliding scale, he has been estimating his needs and does not have an actual written scale at this time.  Does says he has supplies for checking his blood sugars and feels confident that he can manage a sliding scale if 1 is printed for him.     Objective:  Physical Exam: BP 122/74   Pulse 91   Ht 6\' 3"  (1.905 m)   Wt 289 lb (131.1 kg)   SpO2 100%   BMI 36.12 kg/m   Gen: NAD, pleasant  CV: RRR with no murmurs appreciated Pulm: NWOB, CTAB with no crackles, wheezes, or rhonchi GI: Normal bowel sounds present. Soft, Nontender, Nondistended. MSK: no edema, cyanosis, or clubbing noted Skin: warm, dry Neuro: grossly normal, moves all extremities Psych: Normal affect and thought content  Results for orders placed or performed in visit on 08/30/19 (from the past 72 hour(s))  HgB A1c     Status: Abnormal   Collection Time: 08/30/19 11:01 AM  Result Value Ref Range   Hemoglobin A1C     HbA1c POC (<> result, manual entry)     HbA1c, POC (prediabetic range)     HbA1c, POC (controlled diabetic range) 9.2 (A) 0.0 - 7.0 %     Assessment/Plan:  Uncontrolled diabetes mellitus with hyperglycemia, with long-term current use of insulin ([place]) Patient with increased A1c to 9.2 up from last A1c of 7.8.  He is listed is taking 60 units of Lantus daily but says that he varies between 20 and 30 units because if he takes the 60 he ends up  very low.  He says that he takes a sliding scale between 2 units and 8 units 3 times daily of short acting insulin but does not actually have a sliding scale, he has been estimating his needs and does not have an actual written scale at this time.  Does says he has supplies for checking his blood sugars and feels confident that he can manage a sliding scale of 1 is printed for him.  I told him that while I would like to reduce his A1c it would be dangerous for me to be increasing his insulin given the inability to know how much he is actually taking in reference to what is currently prescribed to him.  We will start with a lower dose of insulin closer to what he says he has been doing with a printed and clear sliding scale and have him follow back with his PCP in a week with charted blood sugars so that she can adjust as necessary.  Patient also describes diabetic neuropathy getting slightly worse, can increase gabapentin to twice daily.   **History of taking dosing different than was prescribed** Recent prescribe changed to 20 units of long-acting daily and TID sliding scale as follows: Less than 140: No insulin Between 140 and 180: 2 units  short acting Between 180 and 240: 3 units short acting Between 240 and 300: 4 units short acting Between 300 and 350: 6 units short acting Between 350 and 400: 8 units of short acting Over 400: 10 units of short acting   Sherene Sires, DO FAMILY MEDICINE RESIDENT - PGY3 09/02/2019 8:22 AM

## 2019-09-02 NOTE — Assessment & Plan Note (Signed)
Patient with increased A1c to 9.2 up from last A1c of 7.8.  He is listed is taking 60 units of Lantus daily but says that he varies between 20 and 30 units because if he takes the 60 he ends up very low.  He says that he takes a sliding scale between 2 units and 8 units 3 times daily of short acting insulin but does not actually have a sliding scale, he has been estimating his needs and does not have an actual written scale at this time.  Does says he has supplies for checking his blood sugars and feels confident that he can manage a sliding scale of 1 is printed for him.  I told him that while I would like to reduce his A1c it would be dangerous for me to be increasing his insulin given the inability to know how much he is actually taking in reference to what is currently prescribed to him.  We will start with a lower dose of insulin closer to what he says he has been doing with a printed and clear sliding scale and have him follow back with his PCP in a week with charted blood sugars so that she can adjust as necessary.  Patient also describes diabetic neuropathy getting slightly worse, can increase gabapentin to twice daily.   **History of taking dosing different than was prescribed** Recent prescribe changed to 20 units of long-acting daily and TID sliding scale as follows: Less than 140: No insulin Between 140 and 180: 2 units short acting Between 180 and 240: 3 units short acting Between 240 and 300: 4 units short acting Between 300 and 350: 6 units short acting Between 350 and 400: 8 units of short acting Over 400: 10 units of short acting

## 2019-09-04 ENCOUNTER — Other Ambulatory Visit: Payer: Self-pay | Admitting: Family Medicine

## 2019-09-04 DIAGNOSIS — E1165 Type 2 diabetes mellitus with hyperglycemia: Secondary | ICD-10-CM

## 2019-09-04 DIAGNOSIS — Z794 Long term (current) use of insulin: Secondary | ICD-10-CM

## 2019-09-04 MED FILL — FREESTYLE LITE TEST STRIP: 30 days supply | Qty: 100 | Fill #3

## 2019-09-11 ENCOUNTER — Telehealth (INDEPENDENT_AMBULATORY_CARE_PROVIDER_SITE_OTHER): Payer: 59 | Admitting: Family Medicine

## 2019-09-11 ENCOUNTER — Other Ambulatory Visit: Payer: Self-pay

## 2019-09-11 MED FILL — MIRTAZAPINE 30 MG TABLET: 30 | 90 days supply | Qty: 90 | Fill #0

## 2019-09-11 NOTE — Progress Notes (Signed)
Called patient for scheduled telemedicine visit. He informed me he will be unable to participate in virtual appointment today. He needs to reschedule. Denies any urgent concerns with his diabetes. Requested refills of Januvia and Remeron. Patient plans to reschedule at earliest convenience.

## 2019-09-13 ENCOUNTER — Other Ambulatory Visit: Payer: Self-pay | Admitting: Family Medicine

## 2019-09-13 DIAGNOSIS — Z794 Long term (current) use of insulin: Secondary | ICD-10-CM

## 2019-09-13 DIAGNOSIS — E1165 Type 2 diabetes mellitus with hyperglycemia: Secondary | ICD-10-CM

## 2019-09-18 MED FILL — HUMALOG 100 UNITS/ML KWIKPE: 100 | 40 days supply | Qty: 12 | Fill #0

## 2019-09-20 ENCOUNTER — Other Ambulatory Visit: Payer: Self-pay

## 2019-09-20 ENCOUNTER — Telehealth (INDEPENDENT_AMBULATORY_CARE_PROVIDER_SITE_OTHER): Payer: 59 | Admitting: Family Medicine

## 2019-09-20 ENCOUNTER — Encounter: Payer: Self-pay | Admitting: Family Medicine

## 2019-09-20 DIAGNOSIS — F418 Other specified anxiety disorders: Secondary | ICD-10-CM

## 2019-09-20 DIAGNOSIS — E1165 Type 2 diabetes mellitus with hyperglycemia: Secondary | ICD-10-CM

## 2019-09-20 DIAGNOSIS — G4700 Insomnia, unspecified: Secondary | ICD-10-CM

## 2019-09-20 DIAGNOSIS — Z794 Long term (current) use of insulin: Secondary | ICD-10-CM | POA: Diagnosis not present

## 2019-09-20 DIAGNOSIS — J4520 Mild intermittent asthma, uncomplicated: Secondary | ICD-10-CM

## 2019-09-20 MED ORDER — DULOXETINE HCL 60 MG PO CPEP: 60.0000 mg | ORAL_CAPSULE | Freq: Every day | ORAL | 2 refills | Status: DC

## 2019-09-20 MED ORDER — SITAGLIPTIN PHOSPHATE 100 MG PO TABS: 100.0000 mg | ORAL_TABLET | Freq: Every day | ORAL | 10 refills | Status: DC

## 2019-09-20 MED ORDER — ALBUTEROL SULFATE HFA 108 (90 BASE) MCG/ACT IN AERS: 2.0000 | INHALATION_SPRAY | RESPIRATORY_TRACT | 11 refills | Status: DC | PRN

## 2019-09-20 MED ORDER — EMPAGLIFLOZIN 10 MG PO TABS: 10.0000 mg | ORAL_TABLET | Freq: Every day | ORAL | 3 refills | Status: DC

## 2019-09-20 MED ORDER — INSULIN LISPRO (1 UNIT DIAL) 100 UNIT/ML (KWIKPEN): 0.0000 [IU] | PEN_INJECTOR | Freq: Three times a day (TID) | SUBCUTANEOUS | 0 refills | Status: DC

## 2019-09-20 MED ORDER — MIRTAZAPINE 30 MG PO TABS: 30.0000 mg | ORAL_TABLET | Freq: Every day | ORAL | 3 refills | Status: DC

## 2019-09-20 MED ORDER — ARIPIPRAZOLE 5 MG PO TABS: 5.0000 mg | ORAL_TABLET | Freq: Every day | ORAL | 2 refills | Status: DC

## 2019-09-20 MED ORDER — INSULIN DETEMIR 100 UNIT/ML FLEXPEN: 20.0000 [IU] | PEN_INJECTOR | Freq: Every day | SUBCUTANEOUS | 11 refills | Status: DC

## 2019-09-20 MED FILL — JANUVIA 100 MG TABLET: 100 | 30 days supply | Qty: 30 | Fill #0

## 2019-09-20 MED FILL — JARDIANCE 10 MG TABLET: 10 | 30 days supply | Qty: 30 | Fill #0

## 2019-09-20 MED FILL — ALBUTEROL SULFATE HFA 108 (: 108 (90 BAS | 8 days supply | Qty: 18 | Fill #0

## 2019-09-20 MED FILL — DULoxetine HCL 60 MG CPEP: 60 | 90 days supply | Qty: 90 | Fill #0

## 2019-09-20 MED FILL — ARIPiprazole 5 MG TABS: 5 | 90 days supply | Qty: 90 | Fill #0

## 2019-09-20 NOTE — Assessment & Plan Note (Addendum)
Will transition Levemir 20U to every morning instead of evening to help prevent fasting morning hypoglycemia   Continue Januvia 100mg  QD, Metformin 1000mg  BID Add Jardiance 10mg  QD Continue sliding scale and adjust as needed. Likely need less insulin with Jardiance. Patient instructed to keep a close log of blood sugars. MyChart message sent with sliding scale. Repeat BMP in 1 week (1/15) to monitor kidney function Follow up 1 month  (2/12) Plan to start statin at 1 month follow up.

## 2019-09-20 NOTE — Progress Notes (Signed)
[place] Telemedicine Visit  Patient consented to have virtual visit. Method of visit: Video was attempted, but technology challenges prevented patient from using video, so visit was conducted via telephone.  Encounter participants: Patient: Christopher Fernandez - located at Home Provider: Danna Hefty - located at [facility] Others (if applicable): None  Chief Complaint: Diabetes Follow up  HPI: Patient here for follow up diabetes follow up. Seen on 08/30/19 with Dr. Criss Rosales. He was noted to have A1C of 9.2, up from 7.8. He is currently on Metformin 1000mg  BID, sliding scale Humalog, 20U Levemir at night, and Januvia 100mg  QD. Patient was given a more structured sliding scale insulin schedule. He notes it has been going much better. CBGs fasting 65-210 (however usually runs 180's except this morning when he had a 65), preprandial 220-235. Unable to check postprandial due to limited number of test strips. Patient notes usually needing to give 8-10 units of insulin. He notes that although he has a sliding scale he really is just taking 10 units every time and notes that he feels that if his blood sugars have been better controlled because of taking this much. If he took less his blood sugars would be in the 300-400's. Denies any adverse effects such as polyuria, polydipsia, or polyphagia.  ROS: per HPI  Pertinent PMHx: uncontrolled type 2 diabetes  Exam:  Gen: Pleasant older male Respiratory: Breathing comfortably on room air, unlabored breathing  Assessment/Plan:  Uncontrolled diabetes mellitus with hyperglycemia, with long-term current use of insulin ([facility]) Will transition Levemir 20U to every morning instead of evening to help prevent fasting morning hypoglycemia   Continue Januvia 100mg  QD, Metformin 1000mg  BID Add Jardiance 10mg  QD Continue sliding scale and adjust as needed. Likely need less insulin with Jardiance. Patient instructed to keep a close log of blood  sugars. MyChart message sent with sliding scale. Repeat BMP in 1 week (1/15) to monitor kidney function Follow up 1 month  (2/12) Plan to start statin at 1 month follow up.  Anxiety associated with depression Currently on Remeron, Cymbalta, and Abilify. Notes doing well. Denies any SI/HI. Not currently following with psychiatrist. Denies any concerns today. Requested refills.  - Refills provided - RTC if worsening symptoms  Asthma, intermittent Refill of Albuterol inhaler per request    Health Maintenance: Due for Diabetic foot exam, diabetic eye exam, PNA vaccine, Flu, TDAP, and colonoscopy.  Will plan to address at next in person visit in March 2021  Time spent during visit with patient: 25 minutes  Mina Marble, [place], PGY2 09/20/19

## 2019-09-20 NOTE — Assessment & Plan Note (Signed)
Currently on Remeron, Cymbalta, and Abilify. Notes doing well. Denies any SI/HI. Not currently following with psychiatrist. Denies any concerns today. Requested refills.  - Refills provided - RTC if worsening symptoms

## 2019-09-20 NOTE — Assessment & Plan Note (Addendum)
Refill of Albuterol inhaler per request

## 2019-09-29 ENCOUNTER — Other Ambulatory Visit: Payer: 59

## 2019-09-29 ENCOUNTER — Other Ambulatory Visit: Payer: Self-pay

## 2019-09-29 DIAGNOSIS — E1165 Type 2 diabetes mellitus with hyperglycemia: Secondary | ICD-10-CM | POA: Diagnosis not present

## 2019-09-29 DIAGNOSIS — Z794 Long term (current) use of insulin: Secondary | ICD-10-CM | POA: Diagnosis not present

## 2019-09-29 MED FILL — GABAPENTIN 300 MG CAPSULE: 300 | 30 days supply | Qty: 60 | Fill #0

## 2019-09-29 MED FILL — FREESTYLE LITE TEST STRIP: 30 days supply | Qty: 100 | Fill #4

## 2019-09-30 LAB — BASIC METABOLIC PANEL
BUN/Creatinine Ratio: 13 (ref 9–20)
BUN: 15 mg/dL (ref 6–24)
CO2: 20 mmol/L (ref 20–29)
Calcium: 9.7 mg/dL (ref 8.7–10.2)
Chloride: 98 mmol/L (ref 96–106)
Creatinine, Ser: 1.18 mg/dL (ref 0.76–1.27)
GFR calc Af Amer: 83 mL/min/{1.73_m2} (ref >59)
GFR calc non Af Amer: 72 mL/min/{1.73_m2} (ref >59)
Glucose: 82 mg/dL (ref 65–99)
Potassium: 4.7 mmol/L (ref 3.5–5.2)
Sodium: 136 mmol/L (ref 134–144)

## 2019-10-02 ENCOUNTER — Encounter: Payer: Self-pay | Admitting: Family Medicine

## 2019-10-13 ENCOUNTER — Observation Stay (HOSPITAL_COMMUNITY): Admission: AD | Admit: 2019-10-13 | Payer: 59 | Source: Ambulatory Visit | Admitting: Family Medicine

## 2019-10-13 ENCOUNTER — Ambulatory Visit (INDEPENDENT_AMBULATORY_CARE_PROVIDER_SITE_OTHER): Payer: 59 | Admitting: Family Medicine

## 2019-10-13 ENCOUNTER — Encounter: Payer: Self-pay | Admitting: Family Medicine

## 2019-10-13 ENCOUNTER — Telehealth: Payer: Self-pay

## 2019-10-13 ENCOUNTER — Ambulatory Visit (HOSPITAL_COMMUNITY)
Admission: RE | Admit: 2019-10-13 | Discharge: 2019-10-13 | Disposition: A | Discharge status: Home / Self Care | Payer: 59 | Source: Ambulatory Visit | Attending: Family Medicine | Admitting: Family Medicine

## 2019-10-13 ENCOUNTER — Other Ambulatory Visit: Payer: Self-pay

## 2019-10-13 VITALS — BP 110/70 | HR 100 | Wt 281.0 lb

## 2019-10-13 DIAGNOSIS — M869 Osteomyelitis, unspecified: Secondary | ICD-10-CM | POA: Diagnosis not present

## 2019-10-13 DIAGNOSIS — L03031 Cellulitis of right toe: Secondary | ICD-10-CM | POA: Insufficient documentation

## 2019-10-13 DIAGNOSIS — L03115 Cellulitis of right lower limb: Secondary | ICD-10-CM | POA: Diagnosis not present

## 2019-10-13 MED ORDER — DOXYCYCLINE HYCLATE 100 MG PO TABS: 100.0000 mg | ORAL_TABLET | Freq: Two times a day (BID) | ORAL | 0 refills | Status: DC

## 2019-10-13 MED FILL — DOXYCYCLINE HYCLATE 100 MG: 100 | 14 days supply | Qty: 28 | Fill #0

## 2019-10-13 NOTE — Progress Notes (Addendum)
Subjective:   Patient ID: Christopher Fernandez    DOB: August 28, 1971, 49 y.o. male   MRN: [id number]  Christopher Fernandez is a 49 y.o. male with a history of asthma, GERD, uncontrolled DM, alcohol use disorder, anemia, anxiety/depression, tobacco use disorder here for infected toe.  Infected Right 2nd Toe:  2nd right toe became sore ~2 months ago, began to have erythema and discharge (light green) 2 weeks ago. The bone hurts, but he is able to ambulate okay. He does find himself having to favor the toe when he walks at times. Denies fever, chills, lightheadedness. He is now starting to have pain in ankle and foot. History of toe infection in the right big toe that was MRSA positive and has history of partial toe amputation. [place]. Notes discoloration/hypopigmentation that is new since the infection started. Decreased sensation.   Review of Systems:  Per HPI.   [place], medications and smoking status reviewed.  Objective:   BP 110/70   Pulse 100   Wt 281 lb (127.5 kg)   SpO2 96%   BMI 35.12 kg/m  Vitals and nursing note reviewed.  General: well appearing, older AA male, in no acute distress with non-toxic appearance, sitting comfortably in exam chair Resp: Speaking in full sentences  Skin: warm, dry, Small open and actively draining area at tip of 2nd right toe, hypopigmentation, erythema, and desquamation of distal toe, no erythema or warmth appreciated Extremities: pain to deep palpation of right 2nd toe that ends along the line up 2nd phalange/metatarsal to ankle, decreased sensation of distal LE, Coarse feel when probing the puncture site of right second right toe, mild swelling of right foot and ankle  Neuro: Alert and oriented, speech normal         Assessment & Plan:   Osteomyelitis of second toe of right foot [facility]) Patient is a 49 y.o. 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 Overall patent is hemodynamically stable and very well appearing. PE notable for mildly swollen right foot without significant erythema or warmth, actively draining 2nd toe significant white purulent material, pain to palpation of distal phalange, and palpable bone when probing. Due to his PMH and high suspicion for osteomyelitis, discussed direct admission to [facility] for further imaging, IV antibiotics, and ortho consult. Patient declined and opted for outpatient antibiotics and x-ray. Patient was started on 2 week course of Doxycylcine 100mg  BID and sent for STAT radiograph of foot. X-ray notable for extensive bony destruction of the second distal phalanx with fragmentation. Curbsided ortho - per ortho, given he is hemodynamically stable, felt outpatient follow up was acceptable. Patient scheduled with outpatient Ortho on 10/16/19 with Dr. Sharol Given for further management. Patient informed of plan including results and scheduled appointment. Patient understood and agreed to plan. Consider ABI or arterial doppler to evaluate for PAD given history of recurrent infections and poor wound healing.   Orders Placed This Encounter  Procedures  . DG Foot Complete Right    Standing Status:   Future    Number of Occurrences:   1    Standing Expiration Date:   12/10/2020    Order Specific Question:   Reason for Exam (SYMPTOM  OR DIAGNOSIS REQUIRED)    Answer:   Cellulitis, concern for osteomyelitis    Order Specific Question:   Preferred imaging location?  Answer:   [facility]    Order Specific Question:   Radiology Contrast Protocol - do NOT remove file path    Answer:   [email or web address]   Meds ordered this encounter  Medications  . doxycycline (VIBRA-TABS) 100 MG tablet    Sig: Take 1 tablet (100 mg total) by mouth 2 (two) times daily for 14 days.    Dispense:  28 tablet    Refill:   0   Mina Marble, DO PGY-2, [facility] 10/15/2019 5:10 PM

## 2019-10-13 NOTE — Patient Instructions (Signed)
Please go to [facility] for your x-ray immediately. I will call you with the results and recommendations. There is a possibility that you may need an MRI to further evaluate for infection of the bone. If there are signs of bone infection, we will highly recommend admission to the hospital for orthopedic evaluation and IV antibiotics.   Please look for a phone call from me today.  Take care, Dr. Tarry Kos

## 2019-10-13 NOTE — Telephone Encounter (Signed)
Spoke with pt. Informed him of appt with ortho care on 10/16/19 at 8:45a with Dr. Sharol Given. Pt said that he received the message. Salvatore Marvel, CMA

## 2019-10-13 NOTE — Telephone Encounter (Signed)
[facility] calling with stat call report of patient's Right Foot X-Ray. Please see imaging note.   To PCP  Talbot Grumbling, RN

## 2019-10-15 DIAGNOSIS — M869 Osteomyelitis, unspecified: Secondary | ICD-10-CM | POA: Insufficient documentation

## 2019-10-15 NOTE — Assessment & Plan Note (Addendum)
Patient is a 49 y.o. male with PMH notable for uncontrolled diabetes, alcohol and tobacco use diorsder, and prior MRSA infection of right great toe requiring partial amputation presenting today with acute right 2nd toe infection. Overall patent is hemodynamically stable and very well appearing. PE notable for mildly swollen right foot without significant erythema or warmth, actively draining 2nd toe significant white purulent material, pain to palpation of distal phalange, and palpable bone when probing. Due to his PMH and high suspicion for osteomyelitis, discussed direct admission to [facility] for further imaging, IV antibiotics, and ortho consult. Patient declined and opted for outpatient antibiotics and x-ray. Patient was started on 2 week course of Doxycylcine 100mg  BID and sent for STAT radiograph of foot. X-ray notable for extensive bony destruction of the second distal phalanx with fragmentation. Curbsided ortho - per ortho, given he is hemodynamically stable, felt outpatient follow up was acceptable. Patient scheduled with outpatient Ortho on 10/16/19 with Dr. Sharol Given for further management. Patient informed of plan including results and scheduled appointment. Patient understood and agreed to plan. Consider ABI or arterial doppler to evaluate for PAD given history of recurrent infections and poor wound healing.

## 2019-10-16 ENCOUNTER — Encounter (HOSPITAL_COMMUNITY): Payer: Self-pay | Admitting: Orthopedic Surgery

## 2019-10-16 ENCOUNTER — Ambulatory Visit: Payer: 59 | Admitting: Orthopedic Surgery

## 2019-10-16 ENCOUNTER — Encounter: Payer: Self-pay | Admitting: Orthopedic Surgery

## 2019-10-16 ENCOUNTER — Other Ambulatory Visit: Payer: Self-pay | Admitting: Physician Assistant

## 2019-10-16 ENCOUNTER — Other Ambulatory Visit (HOSPITAL_COMMUNITY)
Admission: RE | Admit: 2019-10-16 | Discharge: 2019-10-16 | Disposition: A | Discharge status: Home / Self Care | Payer: 59 | Source: Ambulatory Visit | Attending: Orthopedic Surgery | Admitting: Orthopedic Surgery

## 2019-10-16 ENCOUNTER — Other Ambulatory Visit: Payer: Self-pay

## 2019-10-16 VITALS — Ht 75.0 in | Wt 281.0 lb

## 2019-10-16 DIAGNOSIS — Z01812 Encounter for preprocedural laboratory examination: Secondary | ICD-10-CM | POA: Insufficient documentation

## 2019-10-16 DIAGNOSIS — Z20822 Contact with and (suspected) exposure to covid-19: Secondary | ICD-10-CM | POA: Insufficient documentation

## 2019-10-16 DIAGNOSIS — M869 Osteomyelitis, unspecified: Secondary | ICD-10-CM

## 2019-10-16 LAB — SARS CORONAVIRUS 2 (TAT 6-24 HRS): SARS Coronavirus 2: NEGATIVE

## 2019-10-16 MED FILL — JANUVIA 100 MG TABLET: 100 | 60 days supply | Qty: 60 | Fill #1

## 2019-10-16 MED FILL — JARDIANCE 10 MG TABLET: 10 | 30 days supply | Qty: 30 | Fill #1

## 2019-10-16 NOTE — Progress Notes (Signed)
Office Visit Note   Patient: Christopher Fernandez           Date of Birth: Oct 23, 1970           MRN: [id number] Visit Date: 10/16/2019              Requested by: Danna Hefty, DO [address],  [place] [address] PCP: Danna Hefty, DO  Chief Complaint  Patient presents with  . Right Foot - Pain, New Patient (Initial Visit)      HPI: [name] who is seen for initial evaluation for chronic osteomyelitis right foot second toe.  Patient states he has noticed swelling and ulceration for the past 2 months.  Patient states that he is a type II diabetic he states his sugars have been running well.  He was started on doxycycline this past Friday.  Assessment & Plan: Visit Diagnoses:  1. Osteomyelitis of second toe of right foot ([place])     Plan: Discussed with the chronic destructive bony changes the best option is to proceed with amputation of the second toe at the MTP joint.  Risks and benefits were discussed including nonhealing the wound need for additional surgery.  Patient states he understands wished to proceed at this time plan for outpatient surgery at [facility] on Wednesday.  Follow-Up Instructions: Return in about 2 weeks (around 10/30/2019).   Ortho Exam  Patient is alert, oriented, no adenopathy, well-dressed, normal affect, normal respiratory effort. Examination patient has a strong posterior tibial pulse and a weak dorsalis pedis pulse.  The Doppler was used and patient does have a good triphasic flow through the dorsalis pedis artery.  Patient has sausage digit swelling of the second toe right foot with an ulcer that probes to bone.  Radiographs are reviewed which shows chronic destructive changes to the distal phalanx right second toe.  There is no ascending cellulitis into the foot or ankle.  Patient's most recent hemoglobin A1c is 9.2.  Imaging: No results found. No images are attached to the encounter.  Labs: Lab Results    Component Value Date   HGBA1C 9.2 (A) 08/30/2019   HGBA1C 7.8 (A) 06/14/2018   HGBA1C 8.9 (H) 02/24/2018   REPTSTATUS 06/17/2019 FINAL 06/14/2019   GRAMSTAIN  06/14/2019    RARE WBC PRESENT,BOTH PMN AND MONONUCLEAR RARE GRAM POSITIVE COCCI IN PAIRS RARE GRAM POSITIVE RODS Performed at [facility], [place] [address] [place], [place] [address]    CULT FEW STAPHYLOCOCCUS EPIDERMIDIS 06/14/2019     Lab Results  Component Value Date   ALBUMIN 4.3 02/24/2018    No results found for: MG No results found for: VD25OH  No results found for: PREALBUMIN CBC EXTENDED Latest Ref Rng & Units 06/14/2019 02/24/2018  WBC 4.0 - 10.5 K/uL 12.0(H) 6.7  RBC 4.22 - 5.81 MIL/uL 4.21(L) 4.36  HGB 13.0 - 17.0 g/dL 12.4(L) 12.6(L)  HCT 39.0 - 52.0 % 36.7(L) 38.2  PLT 150 - 400 K/uL 266 275  NEUTROABS 1.7 - 7.7 K/uL 9.5(H) 4.2  LYMPHSABS 0.7 - 4.0 K/uL 1.2 1.8     Body mass index is 35.12 kg/m.  Orders:  No orders of the defined types were placed in this encounter.  No orders of the defined types were placed in this encounter.    Procedures: No procedures performed  Clinical Data: No additional findings.  ROS:  All other systems negative, except as noted in the HPI. Review of Systems  Objective: Vital  Signs: Ht 6\' 3"  (1.905 m)   Wt 281 lb (127.5 kg)   BMI 35.12 kg/m   Specialty Comments:  No specialty comments available.  PMFS History: Patient Active Problem List   Diagnosis Date Noted  . Pyogenic inflammation of bone ([place]) 10/15/2019  . Osteomyelitis of second toe of right foot ([place]) 10/15/2019  . Scrotum swelling 06/13/2019  . Exocrine pancreatic insufficiency 06/14/2018  . Chronic pain of left knee 05/03/2018  . Uncontrolled diabetes mellitus with hyperglycemia, with long-term current use of insulin ([place]) 02/24/2018  . Alcohol use disorder, severe, dependence ([place]) 02/24/2018  . Anxiety associated with depression 02/24/2018  . GERD (gastroesophageal reflux  disease) 02/24/2018  . Anemia 02/24/2018  . Asthma, intermittent 02/24/2018  . Tobacco use disorder 02/24/2018   Past Medical History:  Diagnosis Date  . Alcoholism ([place])   . Anemia   . Anxiety   . Asthma   . COPD (chronic obstructive pulmonary disease) ([place])   . Depression   . Diabetes ([place])   . HTN (hypertension)   . Pancreatitis   . Pneumonia     Family History  Problem Relation Age of Onset  . Diabetes Mother   . Hypertension Mother   . Hyperlipidemia Mother   . Kidney disease Mother   . Thyroid disease Mother   . Breast cancer Mother        mets  . Lung cancer Mother   . Diabetes Father   . Alcohol abuse Sister   . Sickle cell trait Sister   . Diabetes Brother   . Asthma Brother   . Lung cancer Maternal Grandmother   . Kidney disease Maternal Grandmother   . Liver cancer Maternal Uncle        x 4-5  . Kidney disease Maternal Uncle   . Kidney disease Paternal Uncle     Past Surgical History:  Procedure Laterality Date  . 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 APPENDECTOMY    . CHOLECYSTECTOMY  2006   with gallbladder and spleen  . HERNIA REPAIR  2008, 2010   hiatal hernia and 1 additional  . PANCREATIC PSEUDOCYST DRAINAGE    . SPLENECTOMY  2006  . TOTAL KNEE ARTHROPLASTY Right 1982, 1984   Social History   Occupational History  . Not on file  Tobacco Use  . Smoking status: Current Every Day Smoker    Packs/day: 0.50  . Smokeless tobacco: Never Used  Substance and Sexual Activity  . Alcohol use: Yes    Comment: 2-3 per day  . Drug use: Not on file  . Sexual activity: Not on file

## 2019-10-16 NOTE — Progress Notes (Signed)
Spoke with pt for pre-op call. Pt denies cardiac history. Pt is a type 2 Diabetic. Last A1C was 9.2 on 08/30/19. Pt states his fasting blood sugar has been between 160-180. Pt instructed to hold his Jardiance Tuesday and Wednesday AM. Pt is not to take Metformin or Januvia day of surgery. Instructed pt to check his blood sugar when he gets up and every 2 hours until he leaves for the hospital. If blood sugar is >220 take 1/2 of usual correction dose of Humalog insulin. If blood sugar is 70 or below, treat with 1/2 cup of clear juice (apple or cranberry) and recheck blood sugar 15 minutes after drinking juice. Let nurse know when you arrive if you had to drink the juice. Instructed pt to take 1/2 of his usual dose of Levemir Insulin Wednesday AM. He will take 10 units.   Pt had his Covid test done today and states he's in quarantine and understands he needs to stay in it until he comes to the hospital on Wednesday.

## 2019-10-17 MED ORDER — DEXTROSE 5 % IV SOLN
3.0000 g | INTRAVENOUS | Status: AC
  Filled 2019-10-17 (×2): qty 3000

## 2019-10-17 NOTE — Anesthesia Preprocedure Evaluation (Addendum)
Anesthesia Evaluation  Patient identified by MRN, date of birth, ID band Patient awake    Reviewed: Allergy & Precautions, H&P , NPO status , Patient's Chart, lab work & pertinent test results  Airway Mallampati: II  TM Distance: >3 FB Neck ROM: Full    Dental no notable dental hx. (+) Teeth Intact, Dental Advisory Given   Pulmonary asthma , COPD,  COPD inhaler, Current SmokerPatient did not abstain from smoking.,    Pulmonary exam normal breath sounds clear to auscultation       Cardiovascular Exercise Tolerance: Good hypertension,  Rhythm:Regular Rate:Normal     Neuro/Psych Anxiety Depression negative neurological ROS     GI/Hepatic Neg liver ROS, hiatal hernia, GERD  Medicated,  Endo/Other  diabetes, Insulin Dependent, Oral Hypoglycemic AgentsMorbid obesity  Renal/GU negative Renal ROS  negative genitourinary   Musculoskeletal  (+) Arthritis ,   Abdominal   Peds  Hematology  (+) Blood dyscrasia, anemia ,   Anesthesia Other Findings   Reproductive/Obstetrics negative OB ROS                            Anesthesia Physical Anesthesia Plan  ASA: III  Anesthesia Plan: General   Post-op Pain Management:    Induction: Intravenous  PONV Risk Score and Plan: 2 and Ondansetron and Midazolam  Airway Management Planned: LMA  Additional Equipment:   Intra-op Plan:   Post-operative Plan: Extubation in OR  Informed Consent: I have reviewed the patients History and Physical, chart, labs and discussed the procedure including the risks, benefits and alternatives for the proposed anesthesia with the patient or authorized representative who has indicated his/her understanding and acceptance.     Dental advisory given  Plan Discussed with: CRNA  Anesthesia Plan Comments:         Anesthesia Quick Evaluation

## 2019-10-18 ENCOUNTER — Ambulatory Visit (HOSPITAL_COMMUNITY): Payer: 59

## 2019-10-18 ENCOUNTER — Encounter (HOSPITAL_COMMUNITY)
Admission: RE | Disposition: A | Discharge status: Home / Self Care | Payer: Self-pay | Source: Home / Self Care | Attending: Orthopedic Surgery

## 2019-10-18 ENCOUNTER — Telehealth: Payer: Self-pay

## 2019-10-18 ENCOUNTER — Other Ambulatory Visit: Payer: Self-pay

## 2019-10-18 ENCOUNTER — Ambulatory Visit (HOSPITAL_COMMUNITY)
Admission: RE | Admit: 2019-10-18 | Discharge: 2019-10-18 | Disposition: A | Discharge status: Home / Self Care | Payer: 59 | Attending: Orthopedic Surgery | Admitting: Orthopedic Surgery

## 2019-10-18 ENCOUNTER — Encounter (HOSPITAL_COMMUNITY): Payer: Self-pay | Admitting: Orthopedic Surgery

## 2019-10-18 DIAGNOSIS — M868X7 Other osteomyelitis, ankle and foot: Secondary | ICD-10-CM | POA: Diagnosis not present

## 2019-10-18 DIAGNOSIS — Z7951 Long term (current) use of inhaled steroids: Secondary | ICD-10-CM | POA: Diagnosis not present

## 2019-10-18 DIAGNOSIS — Z833 Family history of diabetes mellitus: Secondary | ICD-10-CM | POA: Insufficient documentation

## 2019-10-18 DIAGNOSIS — I10 Essential (primary) hypertension: Secondary | ICD-10-CM | POA: Diagnosis not present

## 2019-10-18 DIAGNOSIS — M1990 Unspecified osteoarthritis, unspecified site: Secondary | ICD-10-CM | POA: Insufficient documentation

## 2019-10-18 DIAGNOSIS — F419 Anxiety disorder, unspecified: Secondary | ICD-10-CM | POA: Diagnosis not present

## 2019-10-18 DIAGNOSIS — Z885 Allergy status to narcotic agent status: Secondary | ICD-10-CM | POA: Insufficient documentation

## 2019-10-18 DIAGNOSIS — E1169 Type 2 diabetes mellitus with other specified complication: Secondary | ICD-10-CM | POA: Insufficient documentation

## 2019-10-18 DIAGNOSIS — J449 Chronic obstructive pulmonary disease, unspecified: Secondary | ICD-10-CM | POA: Diagnosis not present

## 2019-10-18 DIAGNOSIS — Z79899 Other long term (current) drug therapy: Secondary | ICD-10-CM | POA: Diagnosis not present

## 2019-10-18 DIAGNOSIS — D649 Anemia, unspecified: Secondary | ICD-10-CM | POA: Diagnosis not present

## 2019-10-18 DIAGNOSIS — M869 Osteomyelitis, unspecified: Secondary | ICD-10-CM | POA: Diagnosis not present

## 2019-10-18 DIAGNOSIS — F17210 Nicotine dependence, cigarettes, uncomplicated: Secondary | ICD-10-CM | POA: Insufficient documentation

## 2019-10-18 DIAGNOSIS — Z91012 Allergy to eggs: Secondary | ICD-10-CM | POA: Diagnosis not present

## 2019-10-18 DIAGNOSIS — Z794 Long term (current) use of insulin: Secondary | ICD-10-CM | POA: Insufficient documentation

## 2019-10-18 DIAGNOSIS — F329 Major depressive disorder, single episode, unspecified: Secondary | ICD-10-CM | POA: Insufficient documentation

## 2019-10-18 HISTORY — PX: AMPUTATION: SHX166

## 2019-10-18 HISTORY — DX: Personal history of other diseases of the digestive system: Z87.19

## 2019-10-18 HISTORY — DX: Gastro-esophageal reflux disease without esophagitis: K21.9

## 2019-10-18 HISTORY — DX: Unspecified osteoarthritis, unspecified site: M19.90

## 2019-10-18 LAB — COMPREHENSIVE METABOLIC PANEL
ALT: 30 U/L (ref 0–44)
AST: 17 U/L (ref 15–41)
Albumin: 3.8 g/dL (ref 3.5–5.0)
Alkaline Phosphatase: 69 U/L (ref 38–126)
Anion gap: 13 (ref 5–15)
BUN: 16 mg/dL (ref 6–20)
CO2: 24 mmol/L (ref 22–32)
Calcium: 9.4 mg/dL (ref 8.9–10.3)
Chloride: 100 mmol/L (ref 98–111)
Creatinine, Ser: 0.94 mg/dL (ref 0.61–1.24)
GFR calc Af Amer: >60 mL/min (ref >60)
GFR calc non Af Amer: >60 mL/min (ref >60)
Glucose, Bld: 189 mg/dL — ABNORMAL HIGH (ref 70–99)
Potassium: 4.2 mmol/L (ref 3.5–5.1)
Sodium: 137 mmol/L (ref 135–145)
Total Bilirubin: 0.8 mg/dL (ref 0.3–1.2)
Total Protein: 6.8 g/dL (ref 6.5–8.1)

## 2019-10-18 LAB — CBC
HCT: 41.5 % (ref 39.0–52.0)
Hemoglobin: 13.5 g/dL (ref 13.0–17.0)
MCH: 29 pg (ref 26.0–34.0)
MCHC: 32.5 g/dL (ref 30.0–36.0)
MCV: 89.2 fL (ref 80.0–100.0)
Platelets: 319 10*3/uL (ref 150–400)
RBC: 4.65 MIL/uL (ref 4.22–5.81)
RDW: 13.1 % (ref 11.5–15.5)
WBC: 5.4 10*3/uL (ref 4.0–10.5)
nRBC: 0 % (ref 0.0–0.2)

## 2019-10-18 LAB — GLUCOSE, CAPILLARY
Glucose-Capillary: 195 mg/dL — ABNORMAL HIGH (ref 70–99)
Glucose-Capillary: 150 mg/dL — ABNORMAL HIGH (ref 70–99)
Glucose-Capillary: 147 mg/dL — ABNORMAL HIGH (ref 70–99)

## 2019-10-18 SURGERY — AMPUTATION DIGIT
Anesthesia: General | Site: Foot | Laterality: Right

## 2019-10-18 MED ORDER — FENTANYL CITRATE (PF) 100 MCG/2ML IJ SOLN
INTRAMUSCULAR | Status: DC | PRN
  Administered 2019-10-18 (×2): 50 ug via INTRAVENOUS

## 2019-10-18 MED ORDER — CHLORHEXIDINE GLUCONATE 4 % EX LIQD: 60.0000 mL | Freq: Once | CUTANEOUS | Status: DC

## 2019-10-18 MED ORDER — OXYCODONE-ACETAMINOPHEN 5-325 MG PO TABS: 1.0000 | ORAL_TABLET | ORAL | 0 refills | Status: DC | PRN

## 2019-10-18 MED ORDER — ONDANSETRON HCL 4 MG/2ML IJ SOLN
INTRAMUSCULAR | Status: AC
  Filled 2019-10-18: qty 2

## 2019-10-18 MED ORDER — FENTANYL CITRATE (PF) 250 MCG/5ML IJ SOLN
INTRAMUSCULAR | Status: AC
  Filled 2019-10-18: qty 5

## 2019-10-18 MED ORDER — PROPOFOL 10 MG/ML IV BOLUS
INTRAVENOUS | Status: AC
  Filled 2019-10-18: qty 20

## 2019-10-18 MED ORDER — CEFAZOLIN SODIUM-DEXTROSE 1-4 GM/50ML-% IV SOLN
INTRAVENOUS | Status: DC | PRN
  Administered 2019-10-18: 3 g via INTRAVENOUS

## 2019-10-18 MED ORDER — LABETALOL HCL 5 MG/ML IV SOLN: 5.0000 mg | INTRAVENOUS | Status: AC

## 2019-10-18 MED ORDER — MIDAZOLAM HCL 5 MG/5ML IJ SOLN
INTRAMUSCULAR | Status: DC | PRN
  Administered 2019-10-18: 2 mg via INTRAVENOUS

## 2019-10-18 MED ORDER — 0.9 % SODIUM CHLORIDE (POUR BTL) OPTIME
TOPICAL | Status: DC | PRN
  Administered 2019-10-18: 1000 mL

## 2019-10-18 MED ORDER — PROPOFOL 10 MG/ML IV BOLUS
INTRAVENOUS | Status: DC | PRN
  Administered 2019-10-18: 150 mg via INTRAVENOUS

## 2019-10-18 MED ORDER — LABETALOL HCL 5 MG/ML IV SOLN
INTRAVENOUS | Status: AC
  Administered 2019-10-18: 5 mg via INTRAVENOUS
  Filled 2019-10-18: qty 4

## 2019-10-18 MED ORDER — METOPROLOL TARTRATE 5 MG/5ML IV SOLN: 5.0000 mg | INTRAVENOUS | Status: DC | PRN

## 2019-10-18 MED ORDER — ONDANSETRON HCL 4 MG/2ML IJ SOLN
INTRAMUSCULAR | Status: DC | PRN
  Administered 2019-10-18: 4 mg via INTRAVENOUS

## 2019-10-18 MED ORDER — ACETAMINOPHEN 500 MG PO TABS
1000.0000 mg | ORAL_TABLET | Freq: Once | ORAL | Status: AC
  Administered 2019-10-18: 1000 mg via ORAL
  Filled 2019-10-18: qty 2

## 2019-10-18 MED ORDER — LIDOCAINE 2% (20 MG/ML) 5 ML SYRINGE
INTRAMUSCULAR | Status: AC
  Filled 2019-10-18: qty 5

## 2019-10-18 MED ORDER — LACTATED RINGERS IV SOLN: INTRAVENOUS | Status: DC | PRN

## 2019-10-18 MED ORDER — MIDAZOLAM HCL 2 MG/2ML IJ SOLN
INTRAMUSCULAR | Status: AC
  Filled 2019-10-18: qty 2

## 2019-10-18 MED ORDER — ROCURONIUM BROMIDE 10 MG/ML (PF) SYRINGE
PREFILLED_SYRINGE | INTRAVENOUS | Status: AC
  Filled 2019-10-18: qty 10

## 2019-10-18 MED ORDER — HYDROMORPHONE HCL 1 MG/ML IJ SOLN
INTRAMUSCULAR | Status: AC
  Filled 2019-10-18: qty 1

## 2019-10-18 MED ORDER — LIDOCAINE 2% (20 MG/ML) 5 ML SYRINGE
INTRAMUSCULAR | Status: DC | PRN
  Administered 2019-10-18: 60 mg via INTRAVENOUS

## 2019-10-18 MED ORDER — HYDROMORPHONE HCL 1 MG/ML IJ SOLN
0.2500 mg | INTRAMUSCULAR | Status: DC | PRN
  Administered 2019-10-18 (×2): 0.5 mg via INTRAVENOUS

## 2019-10-18 MED FILL — OXYCODONE-ACETAMINOPHEN 5-3: 5-325 | 5 days supply | Qty: 30 | Fill #0

## 2019-10-18 SURGICAL SUPPLY — 30 items
BLADE SURG 21 STRL SS (BLADE) ×2 IMPLANT
BNDG COHESIVE 3X5 TAN STRL LF (GAUZE/BANDAGES/DRESSINGS) ×2 IMPLANT
BNDG COHESIVE 4X5 TAN STRL (GAUZE/BANDAGES/DRESSINGS) ×2 IMPLANT
BNDG ESMARK 4X9 LF (GAUZE/BANDAGES/DRESSINGS) IMPLANT
BNDG GAUZE ELAST 4 BULKY (GAUZE/BANDAGES/DRESSINGS) ×2 IMPLANT
COVER SURGICAL LIGHT HANDLE (MISCELLANEOUS) ×4 IMPLANT
COVER WAND RF STERILE (DRAPES) ×2 IMPLANT
DRAPE U-SHAPE 47X51 STRL (DRAPES) ×2 IMPLANT
DRSG ADAPTIC 3X8 NADH LF (GAUZE/BANDAGES/DRESSINGS) IMPLANT
DRSG EMULSION OIL 3X3 NADH (GAUZE/BANDAGES/DRESSINGS) ×2 IMPLANT
DRSG PAD ABDOMINAL 8X10 ST (GAUZE/BANDAGES/DRESSINGS) ×2 IMPLANT
DURAPREP 26ML APPLICATOR (WOUND CARE) ×2 IMPLANT
ELECT REM PT RETURN 9FT ADLT (ELECTROSURGICAL) ×2
ELECTRODE REM PT RTRN 9FT ADLT (ELECTROSURGICAL) ×1 IMPLANT
GAUZE SPONGE 4X4 12PLY STRL (GAUZE/BANDAGES/DRESSINGS) ×2 IMPLANT
GLOVE BIOGEL PI IND STRL 9 (GLOVE) ×1 IMPLANT
GLOVE BIOGEL PI INDICATOR 9 (GLOVE) ×1
GLOVE SURG ORTHO 9.0 STRL STRW (GLOVE) ×2 IMPLANT
GOWN STRL REUS W/ TWL XL LVL3 (GOWN DISPOSABLE) ×2 IMPLANT
GOWN STRL REUS W/TWL XL LVL3 (GOWN DISPOSABLE) ×2
KIT BASIN OR (CUSTOM PROCEDURE TRAY) ×2 IMPLANT
KIT TURNOVER KIT B (KITS) ×2 IMPLANT
MANIFOLD NEPTUNE II (INSTRUMENTS) ×2 IMPLANT
NEEDLE 22X1 1/2 (OR ONLY) (NEEDLE) IMPLANT
NS IRRIG 1000ML POUR BTL (IV SOLUTION) ×2 IMPLANT
PACK ORTHO EXTREMITY (CUSTOM PROCEDURE TRAY) ×2 IMPLANT
PAD ARMBOARD 7.5X6 YLW CONV (MISCELLANEOUS) ×4 IMPLANT
SUT ETHILON 2 0 PSLX (SUTURE) ×2 IMPLANT
SYR CONTROL 10ML LL (SYRINGE) IMPLANT
TOWEL GREEN STERILE (TOWEL DISPOSABLE) ×2 IMPLANT

## 2019-10-18 NOTE — Progress Notes (Signed)
Orthopedic Tech Progress Note Patient Details:  Christopher Fernandez 1971-09-11 [id number]  Ortho Devices Type of Ortho Device: Postop shoe/boot, Crutches Ortho Device/Splint Interventions: Application   Post Interventions Patient Tolerated: Well, Ambulated well Instructions Provided: Care of device   [place] Pink 10/18/2019, 10:00 AM

## 2019-10-18 NOTE — H&P (Signed)
Christopher Fernandez is an 49 y.o. male.   Chief Complaint: right 2nd toe gangrene HPI: [name] who is seen for initial evaluation for chronic osteomyelitis right foot second toe.  Patient states he has noticed swelling and ulceration for the past 2 months.  Patient states that he is a type II diabetic he states his sugars have been running well.  He was started on doxycycline this past Friday.  Past Medical History:  Diagnosis Date  . Alcoholism ([place])   . Anemia   . Anxiety   . Arthritis   . Asthma   . COPD (chronic obstructive pulmonary disease) ([place])   . Depression   . Diabetes ([place])    type 2  . GERD (gastroesophageal reflux disease)   . History of hiatal hernia   . HTN (hypertension)   . Pancreatitis   . Pneumonia     Past Surgical History:  Procedure Laterality Date  . 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 APPENDECTOMY    . CHOLECYSTECTOMY  2006   with gallbladder and spleen  . HERNIA REPAIR  2008, 2010   hiatal hernia and 1 additional  . PANCREATIC PSEUDOCYST DRAINAGE    . SPLENECTOMY  2006  . TOTAL KNEE ARTHROPLASTY Right 1982, 1984    Family History  Problem Relation Age of Onset  . Diabetes Mother   . Hypertension Mother   . Hyperlipidemia Mother   . Kidney disease Mother   . Thyroid disease Mother   . Breast cancer Mother        mets  . Lung cancer Mother   . Diabetes Father   . Alcohol abuse Sister   . Sickle cell trait Sister   . Diabetes Brother   . Asthma Brother   . Lung cancer Maternal Grandmother   . Kidney disease Maternal Grandmother   . Liver cancer Maternal Uncle        x 4-5  . Kidney disease Maternal Uncle   . Kidney disease Paternal Uncle    Social History:  reports that he has been smoking. He has been smoking about 0.50 packs per day. He has never used smokeless tobacco. He reports previous alcohol use. He reports previous drug use.  Allergies:  Allergies  Allergen Reactions  . Eggs Or  Egg-Derived Products Rash  . Morphine And Related     Cant take because of pancreatitis  . Cocoa Rash    Medications Prior to Admission  Medication Sig Dispense Refill  . albuterol (VENTOLIN HFA) 108 (90 Base) MCG/ACT inhaler Inhale 2-4 puffs into the lungs every 4 (four) hours as needed for wheezing (or cough). (Patient taking differently: Inhale 2 puffs into the lungs every 4 (four) hours as needed for wheezing (or cough). ) 6.7 g 11  . ARIPiprazole (ABILIFY) 5 MG tablet Take 1 tablet (5 mg total) by mouth daily. 90 tablet 2  . bismuth subsalicylate (PEPTO BISMOL) 262 MG/15ML suspension Take 30 mLs by mouth every 6 (six) hours as needed for indigestion or diarrhea or loose stools.    . diphenhydrAMINE (BENADRYL) 25 MG tablet Take 50 mg by mouth every 6 (six) hours as needed for itching or allergies.    [name] doxycycline (VIBRA-TABS) 100 MG tablet Take 1 tablet (100 mg total) by mouth 2 (two) times daily for 14 days. 28 tablet 0  . DULoxetine (CYMBALTA) 60 MG capsule Take 1 capsule (60 mg total) by mouth daily. 90 capsule 2  . empagliflozin (  JARDIANCE) 10 MG TABS tablet Take 10 mg by mouth daily. 30 tablet 3  . fluticasone (FLONASE) 50 MCG/ACT nasal spray Place 1 spray into both nostrils daily as needed for allergies or rhinitis.    . Fluticasone-Salmeterol (ADVAIR DISKUS) 250-50 MCG/DOSE AEPB Inhale 1 puff into the lungs 2 (two) times daily. 180 each 3  . gabapentin (NEURONTIN) 300 MG capsule Take 1 capsule (300 mg total) by mouth 2 (two) times daily. 60 capsule 0  . ibuprofen (ADVIL) 200 MG tablet Take 400 mg by mouth every 6 (six) hours as needed.    . Insulin Detemir (LEVEMIR) 100 UNIT/ML Pen Inject 20 Units into the skin daily at 10 pm. (Patient taking differently: Inject 20 Units into the skin daily. ) 15 mL 11  . insulin lispro (HUMALOG KWIKPEN) 100 UNIT/ML KwikPen Inject 0-0.1 mLs (0-10 Units total) into the skin 3 (three) times daily. (Patient taking differently: Inject 3-10 Units into the  skin 3 (three) times daily. ) 12 mL 0  . metFORMIN (GLUCOPHAGE) 1000 MG tablet Take 1 tablet (1,000 mg total) by mouth 2 (two) times daily with a meal. 180 tablet 3  . mirtazapine (REMERON) 30 MG tablet Take 1 tablet (30 mg total) by mouth at bedtime. 90 tablet 3  . montelukast (SINGULAIR) 10 MG tablet Take 1 tablet (10 mg total) by mouth at bedtime. 90 tablet 3  . sitaGLIPtin (JANUVIA) 100 MG tablet Take 1 tablet (100 mg total) by mouth daily. 30 tablet 10  . VALERIAN ROOT PO Take 2 tablets by mouth at bedtime.    [name] EASY TOUCH PEN NEEDLES 31G X 5 MM MISC Inject 1 application as directed 3 (three) times daily. 100 each 11  . glucose blood (FREESTYLE LITE) test strip Use as instructed 100 each 12  . Lancets (FREESTYLE) lancets Use as instructed 100 each 12    Results for orders placed or performed during the hospital encounter of 10/16/19 (from the past 48 hour(s))  SARS CORONAVIRUS 2 (TAT 6-24 HRS) Nasopharyngeal Nasopharyngeal Swab     Status: None   Collection Time: 10/16/19  9:41 AM   Specimen: Nasopharyngeal Swab  Result Value Ref Range   SARS Coronavirus 2 NEGATIVE NEGATIVE    Comment: (NOTE) SARS-CoV-2 target nucleic acids are NOT DETECTED. The SARS-CoV-2 RNA is generally detectable in upper and lower respiratory specimens during the acute phase of infection. Negative results do not preclude SARS-CoV-2 infection, do not rule out co-infections with other pathogens, and should not be used as the sole basis for treatment or other patient management decisions. Negative results must be combined with clinical observations, patient history, and epidemiological information. The expected result is Negative. Fact Sheet for Patients: SugarRoll.be Fact Sheet for Healthcare Providers: https://www.woods-mathews.com/ This test is not yet approved or cleared by the [place] FDA and  has been authorized for detection and/or diagnosis of SARS-CoV-2  by FDA under an Emergency Use Authorization (EUA). This EUA will remain  in effect (meaning this test can be used) for the duration of the COVID-19 declaration under Section 56 4(b)(1) of the Act, 21 U.S.C. section 360bbb-3(b)(1), unless the authorization is terminated or revoked sooner. Performed at [facility], [place] [address], [place], [place] [address]    No results found.  Review of Systems  All other systems reviewed and are negative.   There were no vitals taken for this visit. Physical Exam  Patient is alert, oriented, no adenopathy, well-dressed, normal affect, normal respiratory effort. Examination patient has  a strong posterior tibial pulse and a weak dorsalis pedis pulse.  The Doppler was used and patient does have a good triphasic flow through the dorsalis pedis artery.  Patient has sausage digit swelling of the second toe right foot with an ulcer that probes to bone.  Radiographs are reviewed which shows chronic destructive changes to the distal phalanx right second toe.  There is no ascending cellulitis into the foot or ankle.  Patient's most recent hemoglobin A1c is 9.2.  Assessment/Plan 1. Osteomyelitis of second toe of right foot ([place])     Plan: Discussed with the chronic destructive bony changes the best option is to proceed with amputation of the second toe at the MTP joint.  Risks and benefits were discussed including nonhealing the wound need for additional surgery.  Patient states he understands wished to proceed at this time plan for outpatient surgery at [facility] on Wednesday.   Bevely Palmer Porscha Axley, PA 10/18/2019, 6:38 AM

## 2019-10-18 NOTE — Anesthesia Postprocedure Evaluation (Signed)
Anesthesia Post Note  Patient: Christopher Fernandez  Procedure(s) Performed: RIGHT SECOND TOE AMPUTATION (Right Foot)     Patient location during evaluation: PACU Anesthesia Type: General Level of consciousness: awake and alert Pain management: pain level controlled Vital Signs Assessment: post-procedure vital signs reviewed and stable Respiratory status: spontaneous breathing, nonlabored ventilation and respiratory function stable Cardiovascular status: blood pressure returned to baseline and stable Postop Assessment: no apparent nausea or vomiting Anesthetic complications: no    Last Vitals:  Vitals:   10/18/19 0919 10/18/19 0949  BP: (!) 124/97 (!) 127/101  Pulse: 87 85  Resp: 14 17  Temp:  36.8 C  SpO2: 97% 98%    Last Pain:  Vitals:   10/18/19 0919  TempSrc:   PainSc: 4                  [name],[name]

## 2019-10-18 NOTE — Telephone Encounter (Signed)
Patient called the office stating that his dressing was saturated in blood and leaking.  Patient had Right 2nd Toe amputation, 10/18/2019.   Talked with Autumn and she advised that patient come into the office, but patient declined and he stated that his wife was a nurse at [employer] [employer] and wanted to know if she could remove the dressing and redress his right foot. Talked with Autumn again and advised her of patient's message above and she stated that patient's wife could remove dressing and apply a dry dressing and change daily. Advised patient and he voiced that he understands.

## 2019-10-18 NOTE — Anesthesia Procedure Notes (Addendum)
Procedure Name: LMA Insertion Date/Time: 10/18/2019 8:26 AM Performed by: Janace Litten, CRNA Pre-anesthesia Checklist: Patient identified, Emergency Drugs available, Suction available, Patient being monitored and Timeout performed Patient Re-evaluated:Patient Re-evaluated prior to induction Oxygen Delivery Method: Circle system utilized Preoxygenation: Pre-oxygenation with 100% oxygen Induction Type: IV induction Ventilation: Mask ventilation without difficulty LMA: LMA inserted LMA Size: 5.0 Number of attempts: 1 Placement Confirmation: positive ETCO2 Tube secured with: Tape Dental Injury: Teeth and Oropharynx as per pre-operative assessment

## 2019-10-18 NOTE — Op Note (Signed)
10/18/2019  9:04 AM  PATIENT:  Christopher Fernandez    PRE-OPERATIVE DIAGNOSIS:  Osteomyelitis Right 2nd Toe  POST-OPERATIVE DIAGNOSIS:  Same  PROCEDURE:  RIGHT SECOND TOE AMPUTATION  SURGEON:  Newt Minion, MD  PHYSICIAN ASSISTANT:None ANESTHESIA:   General  PREOPERATIVE INDICATIONS:  [name] is a  49 y.o. male with a diagnosis of Osteomyelitis Right 2nd Toe who failed conservative measures and elected for surgical management.    The risks benefits and alternatives were discussed with the patient preoperatively including but not limited to the risks of infection, bleeding, nerve injury, cardiopulmonary complications, the need for revision surgery, among others, and the patient was willing to proceed.  OPERATIVE IMPLANTS: None  @ENCIMAGES @  OPERATIVE FINDINGS: Good petechial bleeding at the amputation site no abscess  OPERATIVE PROCEDURE: Patient was brought the operating room and underwent a general anesthetic.  After adequate levels anesthesia were obtained patient's right lower extremity was prepped using DuraPrep draped into a sterile field a timeout was called.  AV incision was made at the MTP joint.  The right second toe was amputated through the MTP joint.  Electrocautery was used for hemostasis the wound was irrigated with normal saline.  There is no abscess no purulence good petechial bleeding.  After irrigation the wound was closed using 2-0 nylon a sterile dressing was applied patient was extubated taken the PACU in stable condition.   DISCHARGE PLANNING:  Antibiotic duration: Preoperative antibiotics  Weightbearing: Touchdown weightbearing on the right  Pain medication: Prescription for Percocet  Dressing care/ Wound VAC: Follow-up in the office in 1 week to change the dressing  Ambulatory devices: Crutches  Discharge to: Home.  Follow-up: In the office 1 week post operative.

## 2019-10-18 NOTE — Progress Notes (Signed)
Patient's CBG 195 upon arrival to short stay.  Patient took 500mg  of metformin this morning.  Dr.  Ola Spurr made aware.  Will recheck CBG prior to patient going to OR.

## 2019-10-18 NOTE — Transfer of Care (Addendum)
Immediate Anesthesia Transfer of Care Note  Patient: Christopher Fernandez  Procedure(s) Performed: RIGHT SECOND TOE AMPUTATION (Right Foot)  Patient Location: PACU  Anesthesia Type:General  Level of Consciousness: awake, alert  and patient cooperative  Airway & Oxygen Therapy: Patient Spontanous Breathing  Post-op Assessment: Report given to RN and Post -op Vital signs reviewed and stable  Post vital signs: Reviewed and stable  Last Vitals:  Vitals Value Taken Time  BP    Temp    Pulse    Resp    SpO2      Last Pain:  Vitals:   10/18/19 0720  TempSrc:   PainSc: 0-No pain         Complications: No apparent anesthesia complications

## 2019-10-25 ENCOUNTER — Other Ambulatory Visit: Payer: Self-pay

## 2019-10-25 ENCOUNTER — Ambulatory Visit (INDEPENDENT_AMBULATORY_CARE_PROVIDER_SITE_OTHER): Payer: 59 | Admitting: Family

## 2019-10-25 ENCOUNTER — Encounter: Payer: Self-pay | Admitting: Family

## 2019-10-25 VITALS — Ht 75.0 in | Wt 280.0 lb

## 2019-10-25 DIAGNOSIS — Z89421 Acquired absence of other right toe(s): Secondary | ICD-10-CM

## 2019-10-25 DIAGNOSIS — M869 Osteomyelitis, unspecified: Secondary | ICD-10-CM

## 2019-10-25 NOTE — Progress Notes (Signed)
Post-Op Visit Note   Patient: Christopher Fernandez           Date of Birth: 1971/05/20           MRN: [id number] Visit Date: 10/25/2019 PCP: Danna Hefty, DO  Chief Complaint:  Chief Complaint  Patient presents with  . Right Foot - Routine Post Op    10/18/19 right 2nd toe amputation     HPI:  HPI The patient is a 49 year old gentleman who presents today status post right second toe amputation on February 3.  He is using crutches for ambulation however does place his foot on the ground.  There is some surrounding maceration his wife has removed his surgical dressing and has done to dressing changes since last week.  He has no concerns.  States he has 2 more days of doxycycline.  Ortho Exam On examination the incision is approximated sutures there is some gaping in the webspace.  There is some surrounding maceration there is no odor there is no erythema no active drainage  Visit Diagnoses: No diagnosis found.  Plan: Discussed nonweightbearing.  Elevation.  Continue daily Dial soap cleansing.  Dry dressing changes.  May shower and get foot wet do not submerge.  Follow-up in 2 weeks for suture removal.  Follow-Up Instructions: No follow-ups on file.   Imaging: No results found.  Orders:  No orders of the defined types were placed in this encounter.  No orders of the defined types were placed in this encounter.    PMFS History: Patient Active Problem List   Diagnosis Date Noted  . Pyogenic inflammation of bone ([place]) 10/15/2019  . Osteomyelitis of second toe of right foot ([place]) 10/15/2019  . Scrotum swelling 06/13/2019  . Exocrine pancreatic insufficiency 06/14/2018  . Chronic pain of left knee 05/03/2018  . Uncontrolled diabetes mellitus with hyperglycemia, with long-term current use of insulin ([place]) 02/24/2018  . Alcohol use disorder, severe, dependence ([place]) 02/24/2018  . Anxiety associated with depression 02/24/2018  . GERD (gastroesophageal reflux disease) 02/24/2018  .  Anemia 02/24/2018  . Asthma, intermittent 02/24/2018  . Tobacco use disorder 02/24/2018   Past Medical History:  Diagnosis Date  . Alcoholism ([place])   . Anemia   . Anxiety   . Arthritis   . Asthma   . COPD (chronic obstructive pulmonary disease) ([place])   . Depression   . Diabetes ([place])    type 2  . GERD (gastroesophageal reflux disease)   . History of hiatal hernia   . HTN (hypertension)   . Pancreatitis   . Pneumonia     Family History  Problem Relation Age of Onset  . Diabetes Mother   . Hypertension Mother   . Hyperlipidemia Mother   . Kidney disease Mother   . Thyroid disease Mother   . Breast cancer Mother        mets  . Lung cancer Mother   . Diabetes Father   . Alcohol abuse Sister   . Sickle cell trait Sister   . Diabetes Brother   . Asthma Brother   . Lung cancer Maternal Grandmother   . Kidney disease Maternal Grandmother   . Liver cancer Maternal Uncle        x 4-5  . Kidney disease Maternal Uncle   . Kidney disease Paternal Uncle     Past Surgical History:  Procedure Laterality Date  . 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 AMPUTATION Right 10/18/2019   Procedure: RIGHT  SECOND TOE AMPUTATION;  Surgeon: Newt Minion, MD;  Location: [facility];  Service: Orthopedics;  Laterality: Right;  . APPENDECTOMY    . CHOLECYSTECTOMY  2006   with gallbladder and spleen  . HERNIA REPAIR  2008, 2010   hiatal hernia and 1 additional  . PANCREATIC PSEUDOCYST DRAINAGE    . SPLENECTOMY  2006  . TOTAL KNEE ARTHROPLASTY Right 1982, 1984   Social History   Occupational History  . Not on file  Tobacco Use  . Smoking status: Current Every Day Smoker    Packs/day: 0.50  . Smokeless tobacco: Never Used  Substance and Sexual Activity  . Alcohol use: Not Currently    Comment: 2-3 per day, none since 2019  . Drug use: Not Currently  . Sexual activity: Not on file

## 2019-10-26 ENCOUNTER — Other Ambulatory Visit: Payer: Self-pay | Admitting: Family Medicine

## 2019-10-26 DIAGNOSIS — E1165 Type 2 diabetes mellitus with hyperglycemia: Secondary | ICD-10-CM

## 2019-10-26 DIAGNOSIS — Z794 Long term (current) use of insulin: Secondary | ICD-10-CM

## 2019-10-26 MED FILL — FREESTYLE LITE TEST STRIP: 30 days supply | Qty: 100 | Fill #5

## 2019-10-27 ENCOUNTER — Other Ambulatory Visit: Payer: Self-pay | Admitting: Family Medicine

## 2019-10-27 ENCOUNTER — Encounter: Payer: Self-pay | Admitting: Family Medicine

## 2019-10-27 ENCOUNTER — Other Ambulatory Visit: Payer: Self-pay

## 2019-10-27 ENCOUNTER — Telehealth (INDEPENDENT_AMBULATORY_CARE_PROVIDER_SITE_OTHER): Payer: 59 | Admitting: Family Medicine

## 2019-10-27 DIAGNOSIS — Z794 Long term (current) use of insulin: Secondary | ICD-10-CM

## 2019-10-27 DIAGNOSIS — E1165 Type 2 diabetes mellitus with hyperglycemia: Secondary | ICD-10-CM

## 2019-10-27 MED ORDER — EMPAGLIFLOZIN 25 MG PO TABS: 25.0000 mg | ORAL_TABLET | Freq: Every day | ORAL | 3 refills | Status: DC

## 2019-10-27 MED ORDER — EASY TOUCH PEN NEEDLES 31G X 5 MM MISC: 1.0000 "application " | Freq: Three times a day (TID) | 11 refills | Status: AC

## 2019-10-27 MED FILL — UNIFINE PENTIPS 31GX3/16: 31G X 5 MM | 33 days supply | Qty: 100 | Fill #0

## 2019-10-27 MED FILL — GABAPENTIN 300 MG CAPSULE: 300 | 30 days supply | Qty: 60 | Fill #0

## 2019-10-27 MED FILL — UNIFINE PENTIPS 31GX3/16": 31G X 5 MM | 33 days supply | Qty: 100 | Fill #0

## 2019-10-27 MED FILL — JARDIANCE 25 MG TABLET: 25 | 90 days supply | Qty: 90 | Fill #0

## 2019-10-27 NOTE — Progress Notes (Signed)
[facility] Telemedicine Visit  Patient consented to have virtual visit. Method of visit: Video  Encounter participants: Patient: Fatima Shimomura - located at Home Provider: Danna Hefty - located at [facility] Others (if applicable): None  Chief Complaint: Diabetes Follow up  HPI:  Diabetes:  Patient is following up for diabetes. He is currently on Levemir 20U every morning, Humalog sliding scale, Jardiance 10mg , Januvia 100mg , and Metformin 1000mg  BID. Preprandial CBG's range from 232-245. Fasting AM blood sugar 250-265. Bedtime CBG is ~200.  Denies any hypoglycemia. Denies any polyuria, polydipsia, polyphagia. Due for PNA vaccine, diabetic eye and foot exam, and urine microalbumin.   Health Maintenance: Due for HIV screening, colonoscopy, TDAP  ROS: per HPI  Pertinent PMHx: T2DM  Exam:  Respiratory: Speaking in full sentences, breathing comfortably  Assessment/Plan:  Uncontrolled diabetes mellitus with hyperglycemia, with long-term current use of insulin (HCC) Improving, although blood sugars are still above goal. Will increase Jardiance to 25mg  QD. Continue Levemir 20U qAM, Humalog sliding scale, Januvia 100mg  QD, and Metformin 1000mg  BID. If continues to be elevated at follow up, may consider switching long acting insulins or making Levemir BID dosing.  - Follow up in 1 month (11/24/19). Will plan to get A1C and urine microalbumin at that time - Discuss starting statin and low dose ACE/ARB for kidney protection - Plan to perform diabetic foot exam and refer for diabetic eye exam  - will discuss PNA vaccine     Health meantenance:  Due for HIV screen, colonoscopy, and TDAP. Plan to discuss at follow up visit on 12/03/19  Total time: 30 minutes. This includes time spent with the patient during the visit as well as time spent before and after the visit reviewing the chart, documenting the encounter, making phone calls, reviewing studies, etc.  Mina Marble, [place], PGY2 10/27/19

## 2019-10-29 NOTE — Assessment & Plan Note (Signed)
Improving, although blood sugars are still above goal. Will increase Jardiance to 25mg  QD. Continue Levemir 20U qAM, Humalog sliding scale, Januvia 100mg  QD, and Metformin 1000mg  BID. If continues to be elevated at follow up, may consider switching long acting insulins or making Levemir BID dosing.  - Follow up in 1 month (11/24/19). Will plan to get A1C and urine microalbumin at that time - Discuss starting statin and low dose ACE/ARB for kidney protection - Plan to perform diabetic foot exam and refer for diabetic eye exam  - will discuss PNA vaccine

## 2019-10-31 ENCOUNTER — Encounter (HOSPITAL_COMMUNITY): Payer: Self-pay

## 2019-10-31 ENCOUNTER — Other Ambulatory Visit: Payer: Self-pay

## 2019-10-31 ENCOUNTER — Emergency Department (HOSPITAL_COMMUNITY): Payer: 59

## 2019-10-31 ENCOUNTER — Emergency Department (HOSPITAL_COMMUNITY)
Admission: EM | Admit: 2019-10-31 | Discharge: 2019-10-31 | Disposition: A | Discharge status: Home / Self Care | Payer: 59 | Attending: Emergency Medicine | Admitting: Emergency Medicine

## 2019-10-31 DIAGNOSIS — Z794 Long term (current) use of insulin: Secondary | ICD-10-CM | POA: Insufficient documentation

## 2019-10-31 DIAGNOSIS — Z89421 Acquired absence of other right toe(s): Secondary | ICD-10-CM | POA: Insufficient documentation

## 2019-10-31 DIAGNOSIS — I10 Essential (primary) hypertension: Secondary | ICD-10-CM | POA: Diagnosis not present

## 2019-10-31 DIAGNOSIS — E119 Type 2 diabetes mellitus without complications: Secondary | ICD-10-CM | POA: Insufficient documentation

## 2019-10-31 DIAGNOSIS — T8140XA Infection following a procedure, unspecified, initial encounter: Secondary | ICD-10-CM | POA: Insufficient documentation

## 2019-10-31 DIAGNOSIS — M7989 Other specified soft tissue disorders: Secondary | ICD-10-CM | POA: Diagnosis not present

## 2019-10-31 DIAGNOSIS — F17210 Nicotine dependence, cigarettes, uncomplicated: Secondary | ICD-10-CM | POA: Diagnosis not present

## 2019-10-31 DIAGNOSIS — J449 Chronic obstructive pulmonary disease, unspecified: Secondary | ICD-10-CM | POA: Insufficient documentation

## 2019-10-31 DIAGNOSIS — T8149XA Infection following a procedure, other surgical site, initial encounter: Secondary | ICD-10-CM | POA: Diagnosis not present

## 2019-10-31 LAB — CBC WITH DIFFERENTIAL/PLATELET
Abs Immature Granulocytes: 0.04 10*3/uL (ref 0.00–0.07)
Basophils Absolute: 0 10*3/uL (ref 0.0–0.1)
Basophils Relative: 0 %
Eosinophils Absolute: 0.2 10*3/uL (ref 0.0–0.5)
Eosinophils Relative: 2 %
HCT: 40.9 % (ref 39.0–52.0)
Hemoglobin: 13.2 g/dL (ref 13.0–17.0)
Immature Granulocytes: 1 %
Lymphocytes Relative: 18 %
Lymphs Abs: 1.6 10*3/uL (ref 0.7–4.0)
MCH: 28.9 pg (ref 26.0–34.0)
MCHC: 32.3 g/dL (ref 30.0–36.0)
MCV: 89.5 fL (ref 80.0–100.0)
Monocytes Absolute: 0.8 10*3/uL (ref 0.1–1.0)
Monocytes Relative: 9 %
Neutro Abs: 6.2 10*3/uL (ref 1.7–7.7)
Neutrophils Relative %: 70 %
Platelets: 327 10*3/uL (ref 150–400)
RBC: 4.57 MIL/uL (ref 4.22–5.81)
RDW: 13 % (ref 11.5–15.5)
WBC: 8.8 10*3/uL (ref 4.0–10.5)
nRBC: 0 % (ref 0.0–0.2)

## 2019-10-31 LAB — COMPREHENSIVE METABOLIC PANEL
ALT: 27 U/L (ref 0–44)
AST: 19 U/L (ref 15–41)
Albumin: 3.7 g/dL (ref 3.5–5.0)
Alkaline Phosphatase: 90 U/L (ref 38–126)
Anion gap: 12 (ref 5–15)
BUN: 9 mg/dL (ref 6–20)
CO2: 23 mmol/L (ref 22–32)
Calcium: 9.1 mg/dL (ref 8.9–10.3)
Chloride: 102 mmol/L (ref 98–111)
Creatinine, Ser: 1.15 mg/dL (ref 0.61–1.24)
GFR calc Af Amer: >60 mL/min (ref >60)
GFR calc non Af Amer: >60 mL/min (ref >60)
Glucose, Bld: 155 mg/dL — ABNORMAL HIGH (ref 70–99)
Potassium: 4.4 mmol/L (ref 3.5–5.1)
Sodium: 137 mmol/L (ref 135–145)
Total Bilirubin: 0.5 mg/dL (ref 0.3–1.2)
Total Protein: 7.3 g/dL (ref 6.5–8.1)

## 2019-10-31 MED ORDER — STERILE WATER FOR INJECTION IJ SOLN
INTRAMUSCULAR | Status: AC
  Administered 2019-10-31: 10 mL
  Filled 2019-10-31: qty 10

## 2019-10-31 MED ORDER — CEFTRIAXONE SODIUM 1 G IJ SOLR
1.0000 g | Freq: Once | INTRAMUSCULAR | Status: AC
  Administered 2019-10-31: 1 g via INTRAMUSCULAR
  Filled 2019-10-31: qty 10

## 2019-10-31 MED ORDER — NITROGLYCERIN 0.2 MG/HR TD PT24: MEDICATED_PATCH | TRANSDERMAL | 0 refills | Status: DC

## 2019-10-31 MED ORDER — DOXYCYCLINE HYCLATE 100 MG PO CAPS: 100.0000 mg | ORAL_CAPSULE | Freq: Two times a day (BID) | ORAL | 0 refills | Status: DC

## 2019-10-31 MED FILL — NITROGLYCERIN 0.2 MG/HR PTC: 0.2 | 30 days supply | Qty: 30 | Fill #0

## 2019-10-31 MED FILL — DOXYCYCLINE HYC 100 MG CAPS: 100 | 10 days supply | Qty: 20 | Fill #0

## 2019-10-31 NOTE — ED Triage Notes (Signed)
Pt had right second toe recently amputated and is now reporting the stiches have come open and the area is draining and malodorous fluid.  Pt reports mild pain to the area, pt ambulatory with crutches.

## 2019-10-31 NOTE — ED Provider Notes (Addendum)
[facility] EMERGENCY DEPARTMENT Provider Note   CSN: [id number] Arrival date & time: 10/31/19  1050     History Chief Complaint  Patient presents with  . Post-op Problem    Christopher Fernandez is a 49 y.o. male.  The history is provided by the patient. No language interpreter was used.  Foot Pain This is a recurrent problem. The current episode started more than 2 days ago. The problem occurs constantly. The problem has been gradually worsening. Nothing aggravates the symptoms. Nothing relieves the symptoms.   Pt had 2nd toe amputated by Dr. Sharol Given 2 weeks ago.  Pt reports wound has opened up and is draining.  Pt reports he has drainage and an odor coming from the wound     Past Medical History:  Diagnosis Date  . Alcoholism ([place])   . Anemia   . Anxiety   . Arthritis   . Asthma   . COPD (chronic obstructive pulmonary disease) ([place])   . Depression   . Diabetes ([place])    type 2  . GERD (gastroesophageal reflux disease)   . History of hiatal hernia   . HTN (hypertension)   . Pancreatitis   . Pneumonia     Patient Active Problem List   Diagnosis Date Noted  . Scrotum swelling 06/13/2019  . Exocrine pancreatic insufficiency 06/14/2018  . Uncontrolled diabetes mellitus with hyperglycemia, with long-term current use of insulin ([place]) 02/24/2018  . Alcohol use disorder, severe, dependence ([place]) 02/24/2018  . Anxiety associated with depression 02/24/2018  . GERD (gastroesophageal reflux disease) 02/24/2018  . Anemia 02/24/2018  . Asthma, intermittent 02/24/2018  . Tobacco use disorder 02/24/2018    Past Surgical History:  Procedure Laterality Date  . 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 AMPUTATION Right 10/18/2019   Procedure: RIGHT SECOND TOE AMPUTATION;  Surgeon: Newt Minion, MD;  Location: [facility];  Service: Orthopedics;  Laterality: Right;  . APPENDECTOMY    . CHOLECYSTECTOMY  2006   with gallbladder and spleen  . HERNIA REPAIR  2008,  2010   hiatal hernia and 1 additional  . PANCREATIC PSEUDOCYST DRAINAGE    . SPLENECTOMY  2006  . TOTAL KNEE ARTHROPLASTY Right 1982, 1984       Family History  Problem Relation Age of Onset  . Diabetes Mother   . Hypertension Mother   . Hyperlipidemia Mother   . Kidney disease Mother   . Thyroid disease Mother   . Breast cancer Mother        mets  . Lung cancer Mother   . Diabetes Father   . Alcohol abuse Sister   . Sickle cell trait Sister   . Diabetes Brother   . Asthma Brother   . Lung cancer Maternal Grandmother   . Kidney disease Maternal Grandmother   . Liver cancer Maternal Uncle        x 4-5  . Kidney disease Maternal Uncle   . Kidney disease Paternal Uncle     Social History   Tobacco Use  . Smoking status: Current Every Day Smoker    Packs/day: 0.50  . Smokeless tobacco: Never Used  Substance Use Topics  . Alcohol use: Not Currently    Comment: 2-3 per day, none since 2019  . Drug use: Not Currently    Home Medications Prior to Admission medications   Medication Sig Start Date End Date Taking? Authorizing Provider  albuterol (VENTOLIN HFA) 108 (90 Base) MCG/ACT inhaler Inhale 2-4  puffs into the lungs every 4 (four) hours as needed for wheezing (or cough). Patient taking differently: Inhale 2 puffs into the lungs every 4 (four) hours as needed for wheezing (or cough).  09/20/19   Mullis, Kiersten P, DO  ARIPiprazole (ABILIFY) 5 MG tablet Take 1 tablet (5 mg total) by mouth daily. 09/20/19   Mullis, Kiersten P, DO  bismuth subsalicylate (PEPTO BISMOL) 262 MG/15ML suspension Take 30 mLs by mouth every 6 (six) hours as needed for indigestion or diarrhea or loose stools.    [provider]  diphenhydrAMINE (BENADRYL) 25 MG tablet Take 50 mg by mouth every 6 (six) hours as needed for itching or allergies.    [provider]  DULoxetine (CYMBALTA) 60 MG capsule Take 1 capsule (60 mg total) by mouth daily. 09/20/19   Mullis, Kiersten P, DO  EASY  TOUCH PEN NEEDLES 31G X 5 MM MISC Inject 1 application as directed 3 (three) times daily. 10/27/19   Mullis, Kiersten P, DO  empagliflozin (JARDIANCE) 25 MG TABS tablet Take 25 mg by mouth daily. 10/27/19   Mullis, Kiersten P, DO  fluticasone (FLONASE) 50 MCG/ACT nasal spray Place 1 spray into both nostrils daily as needed for allergies or rhinitis.    [provider]  Fluticasone-Salmeterol (ADVAIR DISKUS) 250-50 MCG/DOSE AEPB Inhale 1 puff into the lungs 2 (two) times daily. 05/10/19   Mullis, Kiersten P, DO  gabapentin (NEURONTIN) 300 MG capsule TAKE 1 CAPSULE (300 MG TOTAL) BY MOUTH TWICE A DAY 10/26/19   Mullis, Kiersten P, DO  glucose blood (FREESTYLE LITE) test strip Use as instructed 04/19/19   Mullis, Kiersten P, DO  ibuprofen (ADVIL) 200 MG tablet Take 400 mg by mouth every 6 (six) hours as needed.    [provider]  Insulin Detemir (LEVEMIR) 100 UNIT/ML Pen Inject 20 Units into the skin daily at 10 pm. Patient taking differently: Inject 20 Units into the skin daily.  09/20/19   Mullis, Kiersten P, DO  insulin lispro (HUMALOG KWIKPEN) 100 UNIT/ML KwikPen Inject 0-0.1 mLs (0-10 Units total) into the skin 3 (three) times daily. Patient taking differently: Inject 3-10 Units into the skin 3 (three) times daily.  09/20/19   Mullis, Kiersten P, DO  Lancets (FREESTYLE) lancets Use as instructed 03/29/18   Freedom Bing, DO  metFORMIN (GLUCOPHAGE) 1000 MG tablet Take 1 tablet (1,000 mg total) by mouth 2 (two) times daily with a meal. 05/13/19   Mullis, Kiersten P, DO  mirtazapine (REMERON) 30 MG tablet Take 1 tablet (30 mg total) by mouth at bedtime. 09/20/19   Mullis, Kiersten P, DO  montelukast (SINGULAIR) 10 MG tablet Take 1 tablet (10 mg total) by mouth at bedtime. 04/19/19   Mullis, Kiersten P, DO  oxyCODONE-acetaminophen (PERCOCET) 5-325 MG tablet Take 1 tablet by mouth every 4 (four) hours as needed. 10/18/19 10/17/20  Persons, Bevely Palmer, PA  sitaGLIPtin (JANUVIA) 100 MG tablet Take 1  tablet (100 mg total) by mouth daily. 09/20/19   Mullis, Kiersten P, DO  VALERIAN ROOT PO Take 2 tablets by mouth at bedtime.    [provider]    Allergies    Eggs or egg-derived products, Morphine and related, and Cocoa  Review of Systems   Review of Systems  All other systems reviewed and are negative.   Physical Exam Updated Vital Signs BP (!) 126/94   Pulse (!) 107   Temp 98.8 F (37.1 C) (Oral)   Resp 14   Ht 6\' 3"  (1.905  m)   Wt 127 kg   SpO2 97%   BMI 35.00 kg/m   Physical Exam Vitals and nursing note reviewed.  Constitutional:      Appearance: He is well-developed.  HENT:     Head: Normocephalic and atraumatic.  Eyes:     Conjunctiva/sclera: Conjunctivae normal.  Cardiovascular:     Rate and Rhythm: Normal rate and regular rhythm.     Heart sounds: No murmur.  Pulmonary:     Effort: Pulmonary effort is normal. No respiratory distress.  Abdominal:     Tenderness: There is no abdominal tenderness.  Musculoskeletal:     Cervical back: Neck supple.     Comments: swelling distal foot, surgical wound open, sutures loose, small amount of drainage.  Good pulse   Skin:    General: Skin is warm.  Neurological:     General: No focal deficit present.     Mental Status: He is alert.  Psychiatric:        Mood and Affect: Mood normal.     ED Results / Procedures / Treatments   Labs (all labs ordered are listed, but only abnormal results are displayed) Labs Reviewed  COMPREHENSIVE METABOLIC PANEL - Abnormal; Notable for the following components:      Result Value   Glucose, Bld 155 (*)    All other components within normal limits  AEROBIC CULTURE (SUPERFICIAL SPECIMEN)  CBC WITH DIFFERENTIAL/PLATELET    EKG None  Radiology No results found.  Procedures Procedures (including critical care time)  Medications Ordered in ED Medications - No data to display  ED Course  I have reviewed the triage vital signs and the nursing notes.  Pertinent  labs & imaging results that were available during my care of the patient were reviewed by me and considered in my medical decision making (see chart for details).        MDM Rules/Calculators/A&P                     Labs returned .  Normal wbc on cbc. Glucose is 155.   Xray shows possible infection. I spoke to [employer]. Pt given Rocephin 1 gram and  rx for doxycycline.  NItroglycern patch to foot to improve blood flow.  Sutures pulled apart. Sutures removed  Final Clinical Impression(s) / ED Diagnoses Final diagnoses:  Postoperative infection, unspecified type, initial encounter    Rx / DC Orders ED Discharge Orders         Ordered    nitroGLYCERIN (NITRODUR - DOSED IN MG/24 HR) 0.2 mg/hr patch     10/31/19 1339    doxycycline (VIBRAMYCIN) 100 MG capsule  2 times daily     10/31/19 1339           Fransico Meadow, [place] 10/31/19 1319    Fransico Meadow, [place] 10/31/19 1348    Drenda Freeze, MD 11/01/19 [phone]

## 2019-10-31 NOTE — ED Notes (Signed)
Patient verbalizes understanding of discharge instructions. Opportunity for questioning and answers were provided. Armband removed by staff, pt discharged from ED.  

## 2019-11-02 LAB — AEROBIC CULTURE W GRAM STAIN (SUPERFICIAL SPECIMEN)

## 2019-11-03 ENCOUNTER — Ambulatory Visit: Payer: 59 | Admitting: Family

## 2019-11-03 ENCOUNTER — Telehealth: Payer: Self-pay

## 2019-11-03 MED FILL — MONTELUKAST SOD 10 MG TAB: 10 | 90 days supply | Qty: 90 | Fill #1

## 2019-11-03 MED FILL — CEFDINIR 300 MG CAPSULE: 300 | 5 days supply | Qty: 10 | Fill #0

## 2019-11-03 MED FILL — ALBUTEROL SULFATE HFA 108 (: 108 (90 BAS | 8 days supply | Qty: 18 | Fill #1

## 2019-11-03 MED FILL — ADVAIR 250/50 DISKUS: 250-50 | 90 days supply | Qty: 180 | Fill #1

## 2019-11-03 MED FILL — HUMALOG 100 UNITS/ML KWIKPE: 100 | 40 days supply | Qty: 12 | Fill #0

## 2019-11-03 NOTE — Progress Notes (Signed)
ED Antimicrobial Stewardship Positive Culture Follow Up   Christopher Fernandez is an 49 y.o. male who presented to [facility] on 10/31/2019 with a chief complaint of wound infection after recent 2nd toe amputation.   Chief Complaint  Patient presents with  . Post-op Problem    Recent Results (from the past 720 hour(s))  SARS CORONAVIRUS 2 (TAT 6-24 HRS) Nasopharyngeal Nasopharyngeal Swab     Status: None   Collection Time: 10/16/19  9:41 AM   Specimen: Nasopharyngeal Swab  Result Value Ref Range Status   SARS Coronavirus 2 NEGATIVE NEGATIVE Final    Comment: (NOTE) SARS-CoV-2 target nucleic acids are NOT DETECTED. The SARS-CoV-2 RNA is generally detectable in upper and lower respiratory specimens during the acute phase of infection. Negative results do not preclude SARS-CoV-2 infection, do not rule out co-infections with other pathogens, and should not be used as the sole basis for treatment or other patient management decisions. Negative results must be combined with clinical observations, patient history, and epidemiological information. The expected result is Negative. Fact Sheet for Patients: SugarRoll.be Fact Sheet for Healthcare Providers: https://www.woods-mathews.com/ This test is not yet approved or cleared by the [place] FDA and  has been authorized for detection and/or diagnosis of SARS-CoV-2 by FDA under an Emergency Use Authorization (EUA). This EUA will remain  in effect (meaning this test can be used) for the duration of the COVID-19 declaration under Section 56 4(b)(1) of the Act, 21 U.S.C. section 360bbb-3(b)(1), unless the authorization is terminated or revoked sooner. Performed at [facility], [place] [address], [place], [place] [address]   Wound or Superficial Culture     Status: None   Collection Time: 10/31/19 12:34 PM   Specimen: Wound  Result Value Ref Range Status   Specimen Description WOUND FOOT  Final   Special Requests NONE  Final   Gram Stain   Final    RARE GRAM POSITIVE COCCI IN PAIRS RARE GRAM POSITIVE RODS    Culture   Final    FEW SERRATIA MARCESCENS FEW GROUP B STREP(S.AGALACTIAE)ISOLATED TESTING AGAINST S. AGALACTIAE NOT ROUTINELY PERFORMED DUE TO PREDICTABILITY OF AMP/PEN/VAN SUSCEPTIBILITY. Performed at [facility], [place] [address], [place], [place] [address]    Report Status 11/02/2019 FINAL  Final   Organism ID, Bacteria SERRATIA MARCESCENS  Final      Susceptibility   Serratia marcescens - MIC*    CEFAZOLIN >=64 RESISTANT Resistant     CEFEPIME <=0.12 SENSITIVE Sensitive     CEFTAZIDIME <=1 SENSITIVE Sensitive     CEFTRIAXONE <=0.25 SENSITIVE Sensitive     CIPROFLOXACIN <=0.25 SENSITIVE Sensitive     GENTAMICIN <=1 SENSITIVE Sensitive     TRIMETH/SULFA <=20 SENSITIVE Sensitive     * FEW SERRATIA MARCESCENS    [x]  Treated with doxycycline, organism resistant to prescribed antimicrobial []  Patient discharged originally without antimicrobial agent and treatment is now indicated  New antibiotic prescription: cefdinir 300 mg BID x 5 days Stop doxycycline  ED Provider: Charmaine Downs, PA-C   Ronna Polio 11/03/2019, 8:10 AM Clinical Pharmacist Monday - Friday phone -  [phone] Saturday - Sunday phone - [phone]

## 2019-11-03 NOTE — Telephone Encounter (Signed)
Post ED Visit - Positive Culture Follow-up: Successful Patient Follow-Up  Culture assessed and recommendations reviewed by:  []  Elenor Quinones, Pharm.D. []  Heide Guile, Pharm.D., BCPS AQ-ID []  Parks Neptune, Pharm.D., BCPS []  Alycia Rossetti, Pharm.D., BCPS []  [place], [place].D., BCPS, AAHIVP []  Legrand Como, Pharm.D., BCPS, AAHIVP []  Salome Arnt, PharmD, BCPS []  Johnnette Gourd, PharmD, BCPS []  Hughes Better, PharmD, BCPS []  Leeroy Cha, PharmD Gorden Harms Pharm D Positive aerobic culture  []  Patient discharged without antimicrobial prescription and treatment is now indicated [x]  Organism is resistant to prescribed ED discharge antimicrobial []  Patient with positive blood cultures  Changes discussed with ED provider: Charmaine Downs PA New antibiotic prescription Cefdinir 300 mg Twice daily x 5 days Called to [name], date 11/03/19, time 1007   Christopher Fernandez, [name] 11/03/2019, 10:05 AM

## 2019-11-06 ENCOUNTER — Ambulatory Visit (INDEPENDENT_AMBULATORY_CARE_PROVIDER_SITE_OTHER): Payer: 59 | Admitting: Orthopedic Surgery

## 2019-11-06 ENCOUNTER — Other Ambulatory Visit: Payer: Self-pay

## 2019-11-06 ENCOUNTER — Other Ambulatory Visit (HOSPITAL_COMMUNITY)
Admission: RE | Admit: 2019-11-06 | Discharge: 2019-11-06 | Disposition: A | Discharge status: Home / Self Care | Payer: 59 | Source: Ambulatory Visit | Attending: Orthopedic Surgery | Admitting: Orthopedic Surgery

## 2019-11-06 ENCOUNTER — Encounter: Payer: Self-pay | Admitting: Orthopedic Surgery

## 2019-11-06 ENCOUNTER — Other Ambulatory Visit: Payer: Self-pay | Admitting: Physician Assistant

## 2019-11-06 DIAGNOSIS — Z20822 Contact with and (suspected) exposure to covid-19: Secondary | ICD-10-CM | POA: Diagnosis not present

## 2019-11-06 DIAGNOSIS — Z89421 Acquired absence of other right toe(s): Secondary | ICD-10-CM

## 2019-11-06 DIAGNOSIS — Z01812 Encounter for preprocedural laboratory examination: Secondary | ICD-10-CM | POA: Diagnosis not present

## 2019-11-06 DIAGNOSIS — T8781 Dehiscence of amputation stump: Secondary | ICD-10-CM

## 2019-11-06 LAB — SARS CORONAVIRUS 2 (TAT 6-24 HRS): SARS Coronavirus 2: NEGATIVE

## 2019-11-06 NOTE — Progress Notes (Signed)
Office Visit Note   Patient: Christopher Fernandez           Date of Birth: 10/26/1970           MRN: [id number] Visit Date: 11/06/2019              Requested by: Danna Hefty, DO [address],  [place] [address] PCP: Danna Hefty, DO  Chief Complaint  Patient presents with  . Right Foot - Routine Post Op, Follow-up      HPI: [name] who presents 3 weeks status post right second toe amputation. Patient did go to the emergency room last week for the wound dehiscence. He is currently on doxycycline and using a nitroglycerin patch to help with microcirculation.  Assessment & Plan: Visit Diagnoses:  1. H/O amputation of lesser toe, right ([place])   2. Dehiscence of amputation stump (HCC)     Plan: Discussed with the patient he has good circulation of the ankle his problem is primarily on his microcirculatory problem. Discussed that since the toe amputation is completely day his his best option is to proceed with a transmetatarsal amputation. Risks and benefits were discussed including the importance of nonweightbearing risk of wound dehiscence. Patient states he understands wishes to proceed as soon as possible we will plan for surgery on Wednesday.  Follow-Up Instructions: Return in about 1 week (around 11/13/2019).   Ortho Exam  Patient is alert, oriented, no adenopathy, well-dressed, normal affect, normal respiratory effort. Examination patient has a strong posterior tibial and dorsalis pedis pulse the Doppler was used and he has a strong biphasic dorsalis pedis pulse and a strong triphasic posterior tibial pulse. Examination the wound there is complete wound dehiscence with necrotic tissue around the wound with exposed second metatarsal head.  Patient's most recent hemoglobin A1c is 9.2.  Imaging: No results found. No images are attached to the encounter.  Labs: Lab Results  Component Value Date   HGBA1C 9.2 (A) 08/30/2019   HGBA1C  7.8 (A) 06/14/2018   HGBA1C 8.9 (H) 02/24/2018   REPTSTATUS 11/02/2019 FINAL 10/31/2019   GRAMSTAIN  10/31/2019    RARE GRAM POSITIVE COCCI IN PAIRS RARE GRAM POSITIVE RODS    CULT  10/31/2019    FEW SERRATIA MARCESCENS FEW GROUP B STREP(S.AGALACTIAE)ISOLATED TESTING AGAINST S. AGALACTIAE NOT ROUTINELY PERFORMED DUE TO PREDICTABILITY OF AMP/PEN/VAN SUSCEPTIBILITY. Performed at [facility], [place] [address], [place], [place] [address]    LABORGA SERRATIA MARCESCENS 10/31/2019     Lab Results  Component Value Date   ALBUMIN 3.7 10/31/2019   ALBUMIN 3.8 10/18/2019   ALBUMIN 4.3 02/24/2018    No results found for: MG No results found for: VD25OH  No results found for: PREALBUMIN CBC EXTENDED Latest Ref Rng & Units 10/31/2019 10/18/2019 06/14/2019  WBC 4.0 - 10.5 K/uL 8.8 5.4 12.0(H)  RBC 4.22 - 5.81 MIL/uL 4.57 4.65 4.21(L)  HGB 13.0 - 17.0 g/dL 13.2 13.5 12.4(L)  HCT 39.0 - 52.0 % 40.9 41.5 36.7(L)  PLT 150 - 400 K/uL 327 319 266  NEUTROABS 1.7 - 7.7 K/uL 6.2 - 9.5(H)  LYMPHSABS 0.7 - 4.0 K/uL 1.6 - 1.2     There is no height or weight on file to calculate BMI.  Orders:  No orders of the defined types were placed in this encounter.  No orders of the defined types were placed in this encounter.    Procedures: No procedures performed  Clinical Data: No additional  findings.  ROS:  All other systems negative, except as noted in the HPI. Review of Systems  Objective: Vital Signs: There were no vitals taken for this visit.  Specialty Comments:  No specialty comments available.  PMFS History: Patient Active Problem List   Diagnosis Date Noted  . Scrotum swelling 06/13/2019  . Exocrine pancreatic insufficiency 06/14/2018  . Uncontrolled diabetes mellitus with hyperglycemia, with long-term current use of insulin ([place]) 02/24/2018  . Alcohol use disorder, severe, dependence ([place]) 02/24/2018  . Anxiety associated with depression 02/24/2018  . GERD  (gastroesophageal reflux disease) 02/24/2018  . Anemia 02/24/2018  . Asthma, intermittent 02/24/2018  . Tobacco use disorder 02/24/2018   Past Medical History:  Diagnosis Date  . Alcoholism ([place])   . Anemia   . Anxiety   . Arthritis   . Asthma   . COPD (chronic obstructive pulmonary disease) ([place])   . Depression   . Diabetes ([place])    type 2  . GERD (gastroesophageal reflux disease)   . History of hiatal hernia   . HTN (hypertension)   . Pancreatitis   . Pneumonia     Family History  Problem Relation Age of Onset  . Diabetes Mother   . Hypertension Mother   . Hyperlipidemia Mother   . Kidney disease Mother   . Thyroid disease Mother   . Breast cancer Mother        mets  . Lung cancer Mother   . Diabetes Father   . Alcohol abuse Sister   . Sickle cell trait Sister   . Diabetes Brother   . Asthma Brother   . Lung cancer Maternal Grandmother   . Kidney disease Maternal Grandmother   . Liver cancer Maternal Uncle        x 4-5  . Kidney disease Maternal Uncle   . Kidney disease Paternal Uncle     Past Surgical History:  Procedure Laterality Date  . 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 AMPUTATION Right 10/18/2019   Procedure: RIGHT SECOND TOE AMPUTATION;  Surgeon: Newt Minion, MD;  Location: [facility];  Service: Orthopedics;  Laterality: Right;  . APPENDECTOMY    . CHOLECYSTECTOMY  2006   with gallbladder and spleen  . HERNIA REPAIR  2008, 2010   hiatal hernia and 1 additional  . PANCREATIC PSEUDOCYST DRAINAGE    . SPLENECTOMY  2006  . TOTAL KNEE ARTHROPLASTY Right 1982, 1984   Social History   Occupational History  . Not on file  Tobacco Use  . Smoking status: Current Every Day Smoker    Packs/day: 0.50  . Smokeless tobacco: Never Used  Substance and Sexual Activity  . Alcohol use: Not Currently    Comment: 2-3 per day, none since 2019  . Drug use: Not Currently  . Sexual activity: Not on file

## 2019-11-07 ENCOUNTER — Encounter (HOSPITAL_COMMUNITY): Payer: Self-pay | Admitting: Orthopedic Surgery

## 2019-11-07 ENCOUNTER — Other Ambulatory Visit: Payer: Self-pay

## 2019-11-07 MED ORDER — DEXTROSE 5 % IV SOLN
3.0000 g | INTRAVENOUS | Status: AC
  Administered 2019-11-08: 2 g via INTRAVENOUS
  Filled 2019-11-07: qty 3
  Filled 2019-11-07: qty 3000

## 2019-11-07 NOTE — Progress Notes (Signed)
Pt denies SOB, chest pain, and being under the care of a cardiologist. Pt PCP is Dr. Mina Marble. Pt denies having a stress test, echo and cardiac cath. Pt denies having a chest x ray in the last year. Pt stated that fasting CBG ranges from 150-180. Pt made aware to hold Jardiance today and DOS.  Do not take Metformin and Januvia on DOS. Take 10 units of Levemir on DOS if CBG is > 70. Take 1/2 correction dose of Humalog on DOS for CBG > 220. Pt made aware to check CBG every 2 hours prior to arrival to hospital on DOS. Pt made aware to hold Levemir for CBG < 70 on DOS and  treat with 4 ounces of apple or cranberry juice, wait 15 minutes after intervention to recheck CBG, if CBG remains < 70, call Short Stay unit to speak with a nurse. Pt reminded to quarantine. Pt made aware to stop taking  vitamins, fish oil, Valarian Root and herbal medications. Do not take any NSAIDs ie: Ibuprofen, Advil, Naproxen (Aleve), Motrin, BC and Goody Powder. Pt verbalized understanding of all pre-op instructions.

## 2019-11-08 ENCOUNTER — Other Ambulatory Visit: Payer: Self-pay

## 2019-11-08 ENCOUNTER — Telehealth: Payer: Self-pay | Admitting: *Deleted

## 2019-11-08 ENCOUNTER — Ambulatory Visit (HOSPITAL_COMMUNITY)
Admission: RE | Admit: 2019-11-08 | Discharge: 2019-11-08 | Disposition: A | Discharge status: Home / Self Care | Payer: 59 | Attending: Orthopedic Surgery | Admitting: Orthopedic Surgery

## 2019-11-08 ENCOUNTER — Encounter (HOSPITAL_COMMUNITY): Payer: Self-pay | Admitting: Orthopedic Surgery

## 2019-11-08 ENCOUNTER — Ambulatory Visit (HOSPITAL_COMMUNITY): Payer: 59 | Admitting: Certified Registered Nurse Anesthetist

## 2019-11-08 ENCOUNTER — Ambulatory Visit: Payer: 59 | Admitting: Family

## 2019-11-08 ENCOUNTER — Encounter (HOSPITAL_COMMUNITY)
Admission: RE | Disposition: A | Discharge status: Home / Self Care | Payer: Self-pay | Source: Home / Self Care | Attending: Orthopedic Surgery

## 2019-11-08 DIAGNOSIS — F329 Major depressive disorder, single episode, unspecified: Secondary | ICD-10-CM | POA: Diagnosis not present

## 2019-11-08 DIAGNOSIS — Y835 Amputation of limb(s) as the cause of abnormal reaction of the patient, or of later complication, without mention of misadventure at the time of the procedure: Secondary | ICD-10-CM | POA: Insufficient documentation

## 2019-11-08 DIAGNOSIS — T8131XS Disruption of external operation (surgical) wound, not elsewhere classified, sequela: Secondary | ICD-10-CM

## 2019-11-08 DIAGNOSIS — E119 Type 2 diabetes mellitus without complications: Secondary | ICD-10-CM | POA: Insufficient documentation

## 2019-11-08 DIAGNOSIS — Z6835 Body mass index (BMI) 35.0-35.9, adult: Secondary | ICD-10-CM | POA: Insufficient documentation

## 2019-11-08 DIAGNOSIS — T8781 Dehiscence of amputation stump: Secondary | ICD-10-CM | POA: Insufficient documentation

## 2019-11-08 DIAGNOSIS — Z96651 Presence of right artificial knee joint: Secondary | ICD-10-CM | POA: Diagnosis not present

## 2019-11-08 DIAGNOSIS — T8131XA Disruption of external operation (surgical) wound, not elsewhere classified, initial encounter: Secondary | ICD-10-CM | POA: Diagnosis not present

## 2019-11-08 DIAGNOSIS — Z794 Long term (current) use of insulin: Secondary | ICD-10-CM | POA: Insufficient documentation

## 2019-11-08 DIAGNOSIS — Z7951 Long term (current) use of inhaled steroids: Secondary | ICD-10-CM | POA: Diagnosis not present

## 2019-11-08 DIAGNOSIS — F419 Anxiety disorder, unspecified: Secondary | ICD-10-CM | POA: Insufficient documentation

## 2019-11-08 DIAGNOSIS — F17210 Nicotine dependence, cigarettes, uncomplicated: Secondary | ICD-10-CM | POA: Insufficient documentation

## 2019-11-08 DIAGNOSIS — I10 Essential (primary) hypertension: Secondary | ICD-10-CM | POA: Diagnosis not present

## 2019-11-08 DIAGNOSIS — K219 Gastro-esophageal reflux disease without esophagitis: Secondary | ICD-10-CM | POA: Diagnosis not present

## 2019-11-08 DIAGNOSIS — J449 Chronic obstructive pulmonary disease, unspecified: Secondary | ICD-10-CM | POA: Insufficient documentation

## 2019-11-08 DIAGNOSIS — Z79899 Other long term (current) drug therapy: Secondary | ICD-10-CM | POA: Diagnosis not present

## 2019-11-08 DIAGNOSIS — F418 Other specified anxiety disorders: Secondary | ICD-10-CM | POA: Diagnosis not present

## 2019-11-08 DIAGNOSIS — E1165 Type 2 diabetes mellitus with hyperglycemia: Secondary | ICD-10-CM | POA: Diagnosis not present

## 2019-11-08 HISTORY — PX: AMPUTATION: SHX166

## 2019-11-08 HISTORY — DX: Presence of spectacles and contact lenses: Z97.3

## 2019-11-08 HISTORY — DX: Osteomyelitis, unspecified: M86.9

## 2019-11-08 LAB — GLUCOSE, CAPILLARY
Glucose-Capillary: 177 mg/dL — ABNORMAL HIGH (ref 70–99)
Glucose-Capillary: 150 mg/dL — ABNORMAL HIGH (ref 70–99)
Glucose-Capillary: 163 mg/dL — ABNORMAL HIGH (ref 70–99)

## 2019-11-08 SURGERY — AMPUTATION, FOOT, RAY
Anesthesia: Monitor Anesthesia Care | Site: Foot | Laterality: Right

## 2019-11-08 MED ORDER — MIDAZOLAM HCL 2 MG/2ML IJ SOLN
INTRAMUSCULAR | Status: DC | PRN
  Administered 2019-11-08: 2 mg via INTRAVENOUS

## 2019-11-08 MED ORDER — CHLORHEXIDINE GLUCONATE 4 % EX LIQD: 60.0000 mL | Freq: Once | CUTANEOUS | Status: DC

## 2019-11-08 MED ORDER — KETOROLAC TROMETHAMINE 30 MG/ML IJ SOLN
INTRAMUSCULAR | Status: AC
  Filled 2019-11-08: qty 1

## 2019-11-08 MED ORDER — DIPHENHYDRAMINE HCL 50 MG/ML IJ SOLN
INTRAMUSCULAR | Status: DC | PRN
  Administered 2019-11-08: 12.5 mg via INTRAVENOUS

## 2019-11-08 MED ORDER — LACTATED RINGERS IV SOLN: INTRAVENOUS | Status: DC

## 2019-11-08 MED ORDER — HYDROMORPHONE HCL 1 MG/ML IJ SOLN
0.2500 mg | INTRAMUSCULAR | Status: DC | PRN
  Administered 2019-11-08: 0.5 mg via INTRAVENOUS

## 2019-11-08 MED ORDER — ACETAMINOPHEN 10 MG/ML IV SOLN
INTRAVENOUS | Status: AC
  Filled 2019-11-08: qty 100

## 2019-11-08 MED ORDER — OXYCODONE-ACETAMINOPHEN 5-325 MG PO TABS
ORAL_TABLET | ORAL | Status: AC
  Filled 2019-11-08: qty 1

## 2019-11-08 MED ORDER — ACETAMINOPHEN 10 MG/ML IV SOLN
1000.0000 mg | Freq: Four times a day (QID) | INTRAVENOUS | Status: DC
  Administered 2019-11-08: 1000 mg via INTRAVENOUS

## 2019-11-08 MED ORDER — HYDROMORPHONE HCL 1 MG/ML IJ SOLN: 0.2500 mg | INTRAMUSCULAR | Status: DC | PRN

## 2019-11-08 MED ORDER — KETOROLAC TROMETHAMINE 30 MG/ML IJ SOLN
30.0000 mg | Freq: Once | INTRAMUSCULAR | Status: AC
  Administered 2019-11-08: 30 mg via INTRAVENOUS

## 2019-11-08 MED ORDER — MIDAZOLAM HCL 2 MG/2ML IJ SOLN
INTRAMUSCULAR | Status: AC
  Filled 2019-11-08: qty 2

## 2019-11-08 MED ORDER — PROPOFOL 10 MG/ML IV BOLUS
INTRAVENOUS | Status: DC | PRN
  Administered 2019-11-08: 40 mg via INTRAVENOUS

## 2019-11-08 MED ORDER — FENTANYL CITRATE (PF) 100 MCG/2ML IJ SOLN
INTRAMUSCULAR | Status: DC | PRN
  Administered 2019-11-08 (×2): 25 ug via INTRAVENOUS
  Administered 2019-11-08: 100 ug via INTRAVENOUS

## 2019-11-08 MED ORDER — 0.9 % SODIUM CHLORIDE (POUR BTL) OPTIME
TOPICAL | Status: DC | PRN
  Administered 2019-11-08: 1000 mL

## 2019-11-08 MED ORDER — PROPOFOL 500 MG/50ML IV EMUL
INTRAVENOUS | Status: DC | PRN
  Administered 2019-11-08: 75 ug/kg/min via INTRAVENOUS

## 2019-11-08 MED ORDER — FENTANYL CITRATE (PF) 250 MCG/5ML IJ SOLN
INTRAMUSCULAR | Status: AC
  Filled 2019-11-08: qty 5

## 2019-11-08 MED ORDER — HYDROMORPHONE HCL 1 MG/ML IJ SOLN
INTRAMUSCULAR | Status: AC
  Filled 2019-11-08: qty 1

## 2019-11-08 MED ORDER — OXYCODONE-ACETAMINOPHEN 7.5-325 MG PO TABS: 1.0000 | ORAL_TABLET | ORAL | 0 refills | Status: DC | PRN

## 2019-11-08 MED ORDER — OXYCODONE-ACETAMINOPHEN 5-325 MG PO TABS
1.0000 | ORAL_TABLET | Freq: Once | ORAL | Status: AC
  Administered 2019-11-08: 1 via ORAL

## 2019-11-08 MED ORDER — LIDOCAINE HCL (CARDIAC) PF 100 MG/5ML IV SOSY
PREFILLED_SYRINGE | INTRAVENOUS | Status: DC | PRN
  Administered 2019-11-08: 100 mg via INTRAVENOUS

## 2019-11-08 MED ORDER — BUPIVACAINE-EPINEPHRINE (PF) 0.5% -1:200000 IJ SOLN
INTRAMUSCULAR | Status: DC | PRN
  Administered 2019-11-08: 30 mL via PERINEURAL

## 2019-11-08 MED ORDER — BUPIVACAINE HCL (PF) 0.5 % IJ SOLN
INTRAMUSCULAR | Status: DC | PRN
  Administered 2019-11-08: 7 mL

## 2019-11-08 MED FILL — OXYCODONE-APAP 7.5-325MG: 7.5-325 | 5 days supply | Qty: 30 | Fill #0

## 2019-11-08 SURGICAL SUPPLY — 32 items
BLADE SAW SGTL MED 73X18.5 STR (BLADE) ×1 IMPLANT
BLADE SURG 21 STRL SS (BLADE) ×2 IMPLANT
BNDG COHESIVE 4X5 TAN STRL (GAUZE/BANDAGES/DRESSINGS) ×2 IMPLANT
BNDG GAUZE ELAST 4 BULKY (GAUZE/BANDAGES/DRESSINGS) ×2 IMPLANT
COVER SURGICAL LIGHT HANDLE (MISCELLANEOUS) ×3 IMPLANT
COVER WAND RF STERILE (DRAPES) ×2 IMPLANT
DRAPE INCISE IOBAN 85X60 (DRAPES) ×1 IMPLANT
DRAPE U-SHAPE 47X51 STRL (DRAPES) ×2 IMPLANT
DRSG ADAPTIC 3X8 NADH LF (GAUZE/BANDAGES/DRESSINGS) ×2 IMPLANT
DRSG PAD ABDOMINAL 8X10 ST (GAUZE/BANDAGES/DRESSINGS) ×3 IMPLANT
DURAPREP 26ML APPLICATOR (WOUND CARE) ×2 IMPLANT
ELECT REM PT RETURN 9FT ADLT (ELECTROSURGICAL) ×2
ELECTRODE REM PT RTRN 9FT ADLT (ELECTROSURGICAL) ×1 IMPLANT
GAUZE SPONGE 4X4 12PLY STRL (GAUZE/BANDAGES/DRESSINGS) ×2 IMPLANT
GAUZE SPONGE 4X4 12PLY STRL LF (GAUZE/BANDAGES/DRESSINGS) ×1 IMPLANT
GLOVE BIOGEL PI IND STRL 9 (GLOVE) ×1 IMPLANT
GLOVE BIOGEL PI INDICATOR 9 (GLOVE) ×1
GLOVE SURG ORTHO 9.0 STRL STRW (GLOVE) ×2 IMPLANT
GOWN STRL REUS W/ TWL XL LVL3 (GOWN DISPOSABLE) ×2 IMPLANT
GOWN STRL REUS W/TWL XL LVL3 (GOWN DISPOSABLE) ×2
KIT BASIN OR (CUSTOM PROCEDURE TRAY) ×2 IMPLANT
KIT PREVENA INCISION MGT 13 (CANNISTER) ×1 IMPLANT
KIT TURNOVER KIT B (KITS) ×2 IMPLANT
NS IRRIG 1000ML POUR BTL (IV SOLUTION) ×2 IMPLANT
PACK ORTHO EXTREMITY (CUSTOM PROCEDURE TRAY) ×2 IMPLANT
PAD ABD 8X10 STRL (GAUZE/BANDAGES/DRESSINGS) ×1 IMPLANT
PAD ARMBOARD 7.5X6 YLW CONV (MISCELLANEOUS) ×4 IMPLANT
STOCKINETTE IMPERVIOUS LG (DRAPES) IMPLANT
SUT ETHILON 2 0 PSLX (SUTURE) ×3 IMPLANT
TOWEL GREEN STERILE (TOWEL DISPOSABLE) ×2 IMPLANT
TUBE CONNECTING 12X1/4 (SUCTIONS) ×2 IMPLANT
YANKAUER SUCT BULB TIP NO VENT (SUCTIONS) ×2 IMPLANT

## 2019-11-08 NOTE — Anesthesia Preprocedure Evaluation (Addendum)
Anesthesia Evaluation  Patient identified by MRN, date of birth, ID band Patient awake    Reviewed: Allergy & Precautions, H&P , NPO status , Patient's Chart, lab work & pertinent test results  Airway Mallampati: II  TM Distance: >3 FB Neck ROM: Full    Dental no notable dental hx. (+) Teeth Intact, Dental Advisory Given   Pulmonary asthma , COPD,  COPD inhaler, Current SmokerPatient did not abstain from smoking.,    Pulmonary exam normal breath sounds clear to auscultation       Cardiovascular hypertension,  Rhythm:Regular Rate:Normal     Neuro/Psych Anxiety Depression negative neurological ROS     GI/Hepatic Neg liver ROS, hiatal hernia, GERD  ,  Endo/Other  diabetes, Insulin Dependent, Oral Hypoglycemic AgentsMorbid obesity  Renal/GU negative Renal ROS  negative genitourinary   Musculoskeletal   Abdominal   Peds  Hematology  (+) Blood dyscrasia, anemia ,   Anesthesia Other Findings   Reproductive/Obstetrics negative OB ROS                            Anesthesia Physical Anesthesia Plan  ASA: III  Anesthesia Plan: MAC and Regional   Post-op Pain Management:    Induction: Intravenous  PONV Risk Score and Plan: 1 and Ondansetron, Midazolam and Propofol infusion  Airway Management Planned: Mask  Additional Equipment:   Intra-op Plan:   Post-operative Plan:   Informed Consent: I have reviewed the patients History and Physical, chart, labs and discussed the procedure including the risks, benefits and alternatives for the proposed anesthesia with the patient or authorized representative who has indicated his/her understanding and acceptance.     Dental advisory given  Plan Discussed with: CRNA  Anesthesia Plan Comments:        Anesthesia Quick Evaluation

## 2019-11-08 NOTE — Anesthesia Procedure Notes (Signed)
Anesthesia Regional Block: Popliteal block   Pre-Anesthetic Checklist: ,, timeout performed, Correct Patient, Correct Site, Correct Laterality, Correct Procedure, Correct Position, site marked, Risks and benefits discussed, pre-op evaluation,  At surgeon's request and post-op pain management  Laterality: Right  Prep: Maximum Sterile Barrier Precautions used, chloraprep       Needles:  Injection technique: Single-shot  Needle Type: Echogenic Stimulator Needle     Needle Length: 9cm  Needle Gauge: 21     Additional Needles:   Procedures:,,,, ultrasound used (permanent image in chart),,,,  Narrative:  Start time: 11/08/2019 7:25 AM End time: 11/08/2019 7:35 AM Injection made incrementally with aspirations every 5 mL. Anesthesiologist: Roderic Palau, MD  Additional Notes: 2% Lidocaine skin wheel. Saphenous block with 7cc of 0.5% Bupivicaine plain.

## 2019-11-08 NOTE — Telephone Encounter (Signed)
Pt called triage phone stating he just had sx this a.m. and was placed on a provina plus unit (wound vac) and it is beeping and showing blockage and wants to know what he needs to do. I went to talk to Dr. Sharol Given and he instructed pt to turn off the unit wait a few mins and then turn it back on, massage the wound also, if the unit still shows blockage after turning it back on then pt needs to come in next day to be seen. I instructed pt what was discussed and he agreed and will call us back if needs to be seen.

## 2019-11-08 NOTE — Progress Notes (Signed)
Orthopedic Tech Progress Note Patient Details:  Christopher Fernandez 1970-09-17 [id number] PACU RN called requesting a POST  OP SHOE. I asked her which one and she said whatever is best for the patient so I brought down the [facility] Ortho Devices Type of Ortho Device: Darco shoe Ortho Device/Splint Location: RLE   Post Interventions Patient Tolerated: Well Instructions Provided: Care of device, Adjustment of device   Janit Pagan 11/08/2019, 11:17 AM

## 2019-11-08 NOTE — Anesthesia Postprocedure Evaluation (Signed)
Anesthesia Post Note  Patient: Christopher Fernandez  Procedure(s) Performed: RIGHT TRANSMETATARSAL AMPUTATION (Right Foot)     Patient location during evaluation: PACU Anesthesia Type: Regional and MAC Level of consciousness: awake and alert Pain management: pain level controlled Vital Signs Assessment: post-procedure vital signs reviewed and stable Respiratory status: spontaneous breathing, nonlabored ventilation and respiratory function stable Cardiovascular status: stable and blood pressure returned to baseline Postop Assessment: no apparent nausea or vomiting Anesthetic complications: no    Last Vitals:  Vitals:   11/08/19 1021 11/08/19 1036  BP: 106/78 114/73  Pulse: 93 84  Resp: 14 13  Temp:    SpO2: 100% 100%    Last Pain:  Vitals:   11/08/19 1036  TempSrc:   PainSc: 5                  FITZGERALD,[name]

## 2019-11-08 NOTE — Op Note (Signed)
11/08/2019  10:11 AM  PATIENT:  Christopher Fernandez    PRE-OPERATIVE DIAGNOSIS:  Dehiscence Right 2nd Toe Amputation  POST-OPERATIVE DIAGNOSIS:  Same  PROCEDURE:  RIGHT TRANSMETATARSAL AMPUTATION Application Prevena wound VAC   SURGEON:  Newt Minion, MD  PHYSICIAN ASSISTANT:None ANESTHESIA:   General  PREOPERATIVE INDICATIONS:  Christopher Fernandez is a  49 y.o. male with a diagnosis of Dehiscence Right 2nd Toe Amputation who failed conservative measures and elected for surgical management.    The risks benefits and alternatives were discussed with the patient preoperatively including but not limited to the risks of infection, bleeding, nerve injury, cardiopulmonary complications, the need for revision surgery, among others, and the patient was willing to proceed.  OPERATIVE IMPLANTS: Praveena wound VAC 13 cm  @ENCIMAGES @  OPERATIVE FINDINGS: Good petechial bleeding calcified vessels no abscess at the level of amputation  OPERATIVE PROCEDURE: Patient was brought the operating room and underwent a regional anesthetic.  After adequate levels anesthesia were obtained patient's right lower extremity was prepped using DuraPrep draped into a sterile field a timeout was called.  A fishmouth incision was made just proximal to the wound dehiscence second toe amputation.  This was carried down through healthy viable tissue.  A transmetatarsal amputation was performed with an oscillating saw.  Electrocautery was used for hemostasis.  The wound was irrigated with normal saline.  The incision was closed using 2-0 nylon.  A 13 cm Prevena wound VAC was applied this was covered with Covan this had a good suction fit.  Patient was taken to the PACU in stable condition.   DISCHARGE PLANNING:  Antibiotic duration: Preoperative antibiotics only  Weightbearing: Nonweightbearing on the right  Pain medication: Prescription for Percocet 7.5 mg  Dressing care/ Wound VAC: Continue wound VAC for 1  week  Ambulatory devices: Walker crutches or wheelchair  Discharge to: Home.  Follow-up: In the office 1 week post operative.

## 2019-11-08 NOTE — Transfer of Care (Signed)
Immediate Anesthesia Transfer of Care Note  Patient: Christopher Fernandez  Procedure(s) Performed: RIGHT TRANSMETATARSAL AMPUTATION (Right Foot)  Patient Location: PACU  Anesthesia Type:MAC combined with regional for post-op pain  Level of Consciousness: awake, alert  and oriented  Airway & Oxygen Therapy: Patient Spontanous Breathing and Patient connected to nasal cannula oxygen  Post-op Assessment: Report given to RN, Post -op Vital signs reviewed and stable and Patient moving all extremities  Post vital signs: Reviewed and stable  Last Vitals:  Vitals Value Taken Time  BP 108/90 11/08/19 1006  Temp    Pulse 99 11/08/19 1006  Resp 16 11/08/19 1006  SpO2 99 % 11/08/19 1006  Vitals shown include unvalidated device data.  Last Pain:  Vitals:   11/08/19 0748  TempSrc:   PainSc: 0-No pain         Complications: No apparent anesthesia complications

## 2019-11-08 NOTE — H&P (Signed)
Christopher Fernandez is an 49 y.o. male.   Chief Complaint: Right Foot Wound Dehiscence HPI: [name] who presents 3 weeks status post right second toe amputation. Patient did go to the emergency room last week for the wound dehiscence. He is currently on doxycycline and using a nitroglycerin patch to help with microcirculation.   Past Medical History:  Diagnosis Date  . Alcoholism ([place])   . Anemia   . Anxiety   . Arthritis   . Asthma   . COPD (chronic obstructive pulmonary disease) ([place])   . Depression   . Diabetes ([place])    type 2  . GERD (gastroesophageal reflux disease)   . History of hiatal hernia   . HTN (hypertension)   . 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 Pancreatitis   . Pneumonia   . Wears glasses     Past Surgical History:  Procedure Laterality Date  . 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 AMPUTATION Right 10/18/2019   Procedure: RIGHT SECOND TOE AMPUTATION;  Surgeon: Newt Minion, MD;  Location: [facility];  Service: Orthopedics;  Laterality: Right;  . APPENDECTOMY    . CHOLECYSTECTOMY  2006   with gallbladder and spleen  . HERNIA REPAIR  2008, 2010   hiatal hernia and 1 additional  . PANCREATIC PSEUDOCYST DRAINAGE    . SPLENECTOMY  2006  . TOTAL KNEE ARTHROPLASTY Right 1982, 1984    Family History  Problem Relation Age of Onset  . Diabetes Mother   . Hypertension Mother   . Hyperlipidemia Mother   . Kidney disease Mother   . Thyroid disease Mother   . Breast cancer Mother        mets  . Lung cancer Mother   . Diabetes Father   . Alcohol abuse Sister   . Sickle cell trait Sister   . Diabetes Brother   . Asthma Brother   . Lung cancer Maternal Grandmother   . Kidney disease Maternal Grandmother   . Liver cancer Maternal Uncle        x 4-5  . Kidney disease Maternal Uncle   . Kidney disease Paternal Uncle    Social History:  reports that he has been smoking. He has been smoking about 0.50 packs per day. He  has never used smokeless tobacco. He reports previous alcohol use. He reports previous drug use.  Allergies:  Allergies  Allergen Reactions  . Eggs Or Egg-Derived Products Rash  . Morphine And Related     Cant take because of pancreatitis  . Cocoa Rash    Medications Prior to Admission  Medication Sig Dispense Refill  . albuterol (VENTOLIN HFA) 108 (90 Base) MCG/ACT inhaler Inhale 2-4 puffs into the lungs every 4 (four) hours as needed for wheezing (or cough). (Patient taking differently: Inhale 2 puffs into the lungs every 4 (four) hours as needed for wheezing (or cough). ) 6.7 g 11  . ARIPiprazole (ABILIFY) 5 MG tablet Take 1 tablet (5 mg total) by mouth daily. 90 tablet 2  . bismuth subsalicylate (PEPTO BISMOL) 262 MG/15ML suspension Take 30 mLs by mouth every 6 (six) hours as needed for indigestion or diarrhea or loose stools.    . cefdinir (OMNICEF) 300 MG capsule Take 300 mg by mouth 2 (two) times daily.    . diphenhydrAMINE (BENADRYL) 25 MG tablet Take 25 mg by mouth every 6 (six) hours as needed for itching or allergies.     [name]  DULoxetine (CYMBALTA) 60 MG capsule Take 1 capsule (60 mg total) by mouth daily. 90 capsule 2  . empagliflozin (JARDIANCE) 25 MG TABS tablet Take 25 mg by mouth daily. 90 tablet 3  . fluticasone (FLONASE) 50 MCG/ACT nasal spray Place 1 spray into both nostrils daily as needed for allergies or rhinitis.    . Fluticasone-Salmeterol (ADVAIR DISKUS) 250-50 MCG/DOSE AEPB Inhale 1 puff into the lungs 2 (two) times daily. 180 each 3  . gabapentin (NEURONTIN) 300 MG capsule TAKE 1 CAPSULE (300 MG TOTAL) BY MOUTH TWICE A DAY (Patient taking differently: Take 600 mg by mouth daily. ) 60 capsule 0  . ibuprofen (ADVIL) 200 MG tablet Take 800 mg by mouth every 8 (eight) hours as needed for headache or moderate pain.     . Insulin Detemir (LEVEMIR) 100 UNIT/ML Pen Inject 20 Units into the skin daily at 10 pm. (Patient taking differently: Inject 20 Units into the skin daily. )  15 mL 11  . insulin lispro (HUMALOG KWIKPEN) 100 UNIT/ML KwikPen Inject 0-0.1 mLs (0-10 Units total) into the skin 3 (three) times daily. (Patient taking differently: Inject 2-10 Units into the skin 3 (three) times daily with meals. ) 12 mL 0  . Melatonin 10 MG CAPS Take 20-30 mg by mouth at bedtime as needed (sleep).    . metFORMIN (GLUCOPHAGE) 1000 MG tablet Take 1 tablet (1,000 mg total) by mouth 2 (two) times daily with a meal. 180 tablet 3  . mirtazapine (REMERON) 30 MG tablet Take 1 tablet (30 mg total) by mouth at bedtime. 90 tablet 3  . montelukast (SINGULAIR) 10 MG tablet Take 1 tablet (10 mg total) by mouth at bedtime. 90 tablet 3  . nitroGLYCERIN (NITRODUR - DOSED IN MG/24 HR) 0.2 mg/hr patch Place patch on foot to improve blood flow.  Change location of patch everyday so that skin does not break down (Patient taking differently: Place 0.05 mg onto the skin daily. Place patch on foot to improve blood flow.  Change location of patch everyday so that skin does not break down) 30 patch 0  . sitaGLIPtin (JANUVIA) 100 MG tablet Take 1 tablet (100 mg total) by mouth daily. 30 tablet 10  . VALERIAN ROOT PO Take 2 tablets by mouth at bedtime.    [name] doxycycline (VIBRAMYCIN) 100 MG capsule Take 1 capsule (100 mg total) by mouth 2 (two) times daily. (Patient not taking: Reported on 11/07/2019) 20 capsule 0  . EASY TOUCH PEN NEEDLES 31G X 5 MM MISC Inject 1 application as directed 3 (three) times daily. 100 each 11  . glucose blood (FREESTYLE LITE) test strip Use as instructed 100 each 12  . Lancets (FREESTYLE) lancets Use as instructed 100 each 12  . oxyCODONE-acetaminophen (PERCOCET) 5-325 MG tablet Take 1 tablet by mouth every 4 (four) hours as needed. (Patient not taking: Reported on 11/07/2019) 30 tablet 0    Results for orders placed or performed during the hospital encounter of 11/06/19 (from the past 48 hour(s))  SARS CORONAVIRUS 2 (TAT 6-24 HRS) Nasopharyngeal Nasopharyngeal Swab     Status:  None   Collection Time: 11/06/19 10:02 AM   Specimen: Nasopharyngeal Swab  Result Value Ref Range   SARS Coronavirus 2 NEGATIVE NEGATIVE    Comment: (NOTE) SARS-CoV-2 target nucleic acids are NOT DETECTED. The SARS-CoV-2 RNA is generally detectable in upper and lower respiratory specimens during the acute phase of infection. Negative results do not preclude SARS-CoV-2 infection, do not rule out co-infections with other  pathogens, and should not be used as the sole basis for treatment or other patient management decisions. Negative results must be combined with clinical observations, patient history, and epidemiological information. The expected result is Negative. Fact Sheet for Patients: SugarRoll.be Fact Sheet for Healthcare Providers: https://www.woods-mathews.com/ This test is not yet approved or cleared by the [place] FDA and  has been authorized for detection and/or diagnosis of SARS-CoV-2 by FDA under an Emergency Use Authorization (EUA). This EUA will remain  in effect (meaning this test can be used) for the duration of the COVID-19 declaration under Section 56 4(b)(1) of the Act, 21 U.S.C. section 360bbb-3(b)(1), unless the authorization is terminated or revoked sooner. Performed at [facility], [place] [address], [place], [place] [address]    No results found.  Review of Systems  All other systems reviewed and are negative.   There were no vitals taken for this visit. Physical Exam  Patient is alert, oriented, no adenopathy, well-dressed, normal affect, normal respiratory effort. Examination patient has a strong posterior tibial and dorsalis pedis pulse the Doppler was used and he has a strong biphasic dorsalis pedis pulse and a strong triphasic posterior tibial pulse. Examination the wound there is complete wound dehiscence with necrotic tissue around the wound with exposed second metatarsal head. Lungs :Clear COR:  Regular Rate and rhythm   Patient's most recent hemoglobin A1c is 9.2.  Assessment/Plan: Discussed with the patient he has good circulation of the ankle his problem is primarily on his microcirculatory problem. Discussed that since the toe amputation is completely dry  his his best option is to proceed with a transmetatarsal amputation. Risks and benefits were discussed including the importance of nonweightbearing risk of wound dehiscence. Patient states he understands wishes to proceed as soon as possible we will plan for surgery on Wednesday.    Bevely Palmer Persons, PA 11/08/2019, 6:39 AM

## 2019-11-10 ENCOUNTER — Telehealth: Payer: Self-pay | Admitting: Orthopedic Surgery

## 2019-11-10 ENCOUNTER — Telehealth: Payer: Self-pay | Admitting: Radiology

## 2019-11-10 NOTE — Telephone Encounter (Signed)
Patient called triage line this morning stating the canister on his wound vac is full. Offered patient appointment to come in to office today, however, he does not have transportation and states that he will not be able to make it all day. He states that his wife is in the area and would like to know if she can come in and pick up new cannister. I explained we do not have these in the office. After speaking with Autumn, patient was advised to call number on discharge paperwork from hospital that is listed to call with questions and concerns to see if they may have additional cannister his wife can pick up. Patient will call back if problems.

## 2019-11-10 NOTE — Telephone Encounter (Signed)
I called and lm on vm to call back with specific questions that I can then relay to PA and call back with answers.

## 2019-11-10 NOTE — Telephone Encounter (Signed)
Patient called.   He would like a call back to discuss an question he has about his wound vac.   Call back number: [phone]

## 2019-11-13 ENCOUNTER — Other Ambulatory Visit: Payer: Self-pay

## 2019-11-13 ENCOUNTER — Ambulatory Visit (INDEPENDENT_AMBULATORY_CARE_PROVIDER_SITE_OTHER): Payer: 59 | Admitting: Physician Assistant

## 2019-11-13 ENCOUNTER — Encounter: Payer: Self-pay | Admitting: Orthopedic Surgery

## 2019-11-13 VITALS — Ht 75.0 in | Wt 280.0 lb

## 2019-11-13 DIAGNOSIS — T8781 Dehiscence of amputation stump: Secondary | ICD-10-CM

## 2019-11-13 NOTE — Telephone Encounter (Signed)
Was treated in office today  

## 2019-11-13 NOTE — Progress Notes (Signed)
Office Visit Note   Patient: Christopher Fernandez           Date of Birth: 1971/08/13           MRN: [id number] Visit Date: 11/13/2019              Requested by: Danna Hefty, DO [address],  [place] [address] PCP: Danna Hefty, DO  Chief Complaint  Patient presents with  . Right Foot - Routine Post Op    11/08/19 right transmet amputation       HPI: This is a pleasant gentleman who is 1 week status post right transmetatarsal amputation.  He is here for wound VAC removal.  He has been compliant with elevating his foot and not weightbearing  Assessment & Plan: Visit Diagnoses: No diagnosis found.  Plan: He may begin to do daily dressing changes with daily washing he should not immerse the foot.  But may shower.  Follow-up 1 week.  I emphasized the importance of good glucose control elevating his foot and following his weightbearing restrictions  Follow-Up Instructions: No follow-ups on file.   Ortho Exam  Patient is alert, oriented, no adenopathy, well-dressed, normal affect, normal respiratory effort. Focused examination demonstrates well-healing surgical incision well approximated wound edges without any necrosis.  He does have some bleeding there is no odor no surrounding cellulitis  Imaging: No results found. No images are attached to the encounter.  Labs: Lab Results  Component Value Date   HGBA1C 9.2 (A) 08/30/2019   HGBA1C 7.8 (A) 06/14/2018   HGBA1C 8.9 (H) 02/24/2018   REPTSTATUS 11/02/2019 FINAL 10/31/2019   GRAMSTAIN  10/31/2019    RARE GRAM POSITIVE COCCI IN PAIRS RARE GRAM POSITIVE RODS    CULT  10/31/2019    FEW SERRATIA MARCESCENS FEW GROUP B STREP(S.AGALACTIAE)ISOLATED TESTING AGAINST S. AGALACTIAE NOT ROUTINELY PERFORMED DUE TO PREDICTABILITY OF AMP/PEN/VAN SUSCEPTIBILITY. Performed at [facility], [place] [address] [place], [place] [address]    LABORGA SERRATIA MARCESCENS 10/31/2019     Lab Results  Component Value  Date   ALBUMIN 3.7 10/31/2019   ALBUMIN 3.8 10/18/2019   ALBUMIN 4.3 02/24/2018    No results found for: MG No results found for: VD25OH  No results found for: PREALBUMIN CBC EXTENDED Latest Ref Rng & Units 10/31/2019 10/18/2019 06/14/2019  WBC 4.0 - 10.5 K/uL 8.8 5.4 12.0(H)  RBC 4.22 - 5.81 MIL/uL 4.57 4.65 4.21(L)  HGB 13.0 - 17.0 g/dL 13.2 13.5 12.4(L)  HCT 39.0 - 52.0 % 40.9 41.5 36.7(L)  PLT 150 - 400 K/uL 327 319 266  NEUTROABS 1.7 - 7.7 K/uL 6.2 - 9.5(H)  LYMPHSABS 0.7 - 4.0 K/uL 1.6 - 1.2     Body mass index is 35 kg/m.  Orders:  No orders of the defined types were placed in this encounter.  No orders of the defined types were placed in this encounter.    Procedures: No procedures performed  Clinical Data: No additional findings.  ROS:  All other systems negative, except as noted in the HPI. Review of Systems  Objective: Vital Signs: Ht 6\' 3"  (1.905 m)   Wt 280 lb (127 kg)   BMI 35.00 kg/m   Specialty Comments:  No specialty comments available.  PMFS History: Patient Active Problem List   Diagnosis Date Noted  . Rupture of operation wound   . Scrotum swelling 06/13/2019  . Exocrine pancreatic insufficiency 06/14/2018  . Uncontrolled diabetes mellitus with hyperglycemia, with long-term  current use of insulin ([place]) 02/24/2018  . Alcohol use disorder, severe, dependence ([place]) 02/24/2018  . Anxiety associated with depression 02/24/2018  . GERD (gastroesophageal reflux disease) 02/24/2018  . Anemia 02/24/2018  . Asthma, intermittent 02/24/2018  . Tobacco use disorder 02/24/2018   Past Medical History:  Diagnosis Date  . Alcoholism ([place])   . Anemia   . Anxiety   . Arthritis   . Asthma   . COPD (chronic obstructive pulmonary disease) ([place])   . Depression   . Diabetes ([place])    type 2  . GERD (gastroesophageal reflux disease)   . History of hiatal hernia   . HTN (hypertension)   . 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 Pancreatitis   .  Pneumonia   . Wears glasses     Family History  Problem Relation Age of Onset  . Diabetes Mother   . Hypertension Mother   . Hyperlipidemia Mother   . Kidney disease Mother   . Thyroid disease Mother   . Breast cancer Mother        mets  . Lung cancer Mother   . Diabetes Father   . Alcohol abuse Sister   . Sickle cell trait Sister   . Diabetes Brother   . Asthma Brother   . Lung cancer Maternal Grandmother   . Kidney disease Maternal Grandmother   . Liver cancer Maternal Uncle        x 4-5  . Kidney disease Maternal Uncle   . Kidney disease Paternal Uncle     Past Surgical History:  Procedure Laterality Date  . 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 AMPUTATION Right 10/18/2019   Procedure: RIGHT SECOND TOE AMPUTATION;  Surgeon: Newt Minion, MD;  Location: [facility];  Service: Orthopedics;  Laterality: Right;  . AMPUTATION Right 11/08/2019   Procedure: RIGHT TRANSMETATARSAL AMPUTATION;  Surgeon: Newt Minion, MD;  Location: [facility];  Service: Orthopedics;  Laterality: Right;  . APPENDECTOMY    . CHOLECYSTECTOMY  2006   with gallbladder and spleen  . HERNIA REPAIR  2008, 2010   hiatal hernia and 1 additional  . PANCREATIC PSEUDOCYST DRAINAGE    . SPLENECTOMY  2006  . TOTAL KNEE ARTHROPLASTY Right 1982, 1984   Social History   Occupational History  . Not on file  Tobacco Use  . Smoking status: Current Every Day Smoker    Packs/day: 0.50  . Smokeless tobacco: Never Used  Substance and Sexual Activity  . Alcohol use: Not Currently    Comment: 2-3 per day, none since 2019  . Drug use: Not Currently  . Sexual activity: Not on file

## 2019-11-20 ENCOUNTER — Other Ambulatory Visit: Payer: Self-pay

## 2019-11-20 ENCOUNTER — Encounter: Payer: Self-pay | Admitting: Orthopedic Surgery

## 2019-11-20 ENCOUNTER — Ambulatory Visit (INDEPENDENT_AMBULATORY_CARE_PROVIDER_SITE_OTHER): Payer: 59 | Admitting: Orthopedic Surgery

## 2019-11-20 ENCOUNTER — Other Ambulatory Visit: Payer: Self-pay | Admitting: Physician Assistant

## 2019-11-20 DIAGNOSIS — Z794 Long term (current) use of insulin: Secondary | ICD-10-CM

## 2019-11-20 DIAGNOSIS — E1165 Type 2 diabetes mellitus with hyperglycemia: Secondary | ICD-10-CM

## 2019-11-20 MED ORDER — OXYCODONE-ACETAMINOPHEN 7.5-325 MG PO TABS: 1.0000 | ORAL_TABLET | ORAL | 0 refills | Status: DC | PRN

## 2019-11-20 MED ORDER — CIPROFLOXACIN HCL 500 MG PO TABS: 500.0000 mg | ORAL_TABLET | Freq: Two times a day (BID) | ORAL | 0 refills | Status: AC

## 2019-11-20 MED ORDER — PENTOXIFYLLINE ER 400 MG PO TBCR: 400.0000 mg | EXTENDED_RELEASE_TABLET | Freq: Three times a day (TID) | ORAL | 3 refills | Status: DC

## 2019-11-20 MED ORDER — GABAPENTIN 300 MG PO CAPS: 300.0000 mg | ORAL_CAPSULE | Freq: Three times a day (TID) | ORAL | 0 refills | Status: DC

## 2019-11-20 MED FILL — OXYCODONE-APAP 7.5-325MG: 7.5-325 | 5 days supply | Qty: 30 | Fill #0

## 2019-11-20 MED FILL — CIPROFLOXACIN HCL 500 MG TA: 500 | 10 days supply | Qty: 20 | Fill #0

## 2019-11-20 MED FILL — PENTOXIFYLLINE 400 MG TAB S: 400 | 30 days supply | Qty: 90 | Fill #0

## 2019-11-20 NOTE — Progress Notes (Signed)
Office Visit Note   Patient: Christopher Fernandez           Date of Birth: [birth year]           MRN: [id number] Visit Date: 11/20/2019              Requested by: Danna Hefty, DO [address],  [place] [address] PCP: Danna Hefty, DO  Chief Complaint  Patient presents with  . Right Foot - Follow-up    11/08/2019 Right Transmet Amputation      HPI: This is a pleasant gentleman who is now 2 weeks status post right transmetatarsal amputation.  He is currently smoking a half a pack of cigarettes daily.  He is discouraged because he still continues to have a lot of nerve and pain in the foot and feels it is not progressing with healing  Assessment & Plan: Visit Diagnoses:  1. Uncontrolled diabetes mellitus with hyperglycemia, with long-term current use of insulin ([place])     Plan: Patient was seen today directly by Dr. Sharol Given.  He recommends revision of his right transmetatarsal amputation this could be done later this week on Friday.  He would also like him to start some [place] tall and antibiotic.  His last cultures showed sensitivity to Cipro which we will order today.  I have also refilled his gabapentin at 300 mg 3 times daily also refilled his oxycodone reviewed the risks and surgical outcomes of the surgery with he and his wife..  We will plan for this later this week  Follow-Up Instructions: No follow-ups on file.   Ortho Exam  Patient is alert, oriented, no adenopathy, well-dressed, normal affect, normal respiratory effort. He had a strong dorsal and posterior tibial tendon biphasic Doppler pulses.  Examination of the wound demonstrates dehiscence and nonviable necrotic tissue with drainage.  Moderate amount of soft tissue swelling Lungs Clear Heart Regular rate and Rhythm  Imaging: No results found. No images are attached to the encounter.  Labs: Lab Results  Component Value Date   HGBA1C 9.2 (A) 08/30/2019   HGBA1C 7.8 (A) 06/14/2018   HGBA1C 8.9 (H)  02/24/2018   REPTSTATUS 11/02/2019 FINAL 10/31/2019   GRAMSTAIN  10/31/2019    RARE GRAM POSITIVE COCCI IN PAIRS RARE GRAM POSITIVE RODS    CULT  10/31/2019    FEW SERRATIA MARCESCENS FEW GROUP B STREP(S.AGALACTIAE)ISOLATED TESTING AGAINST S. AGALACTIAE NOT ROUTINELY PERFORMED DUE TO PREDICTABILITY OF AMP/PEN/VAN SUSCEPTIBILITY. Performed at [facility], [place] [address], [place],  [address]    LABORGA SERRATIA MARCESCENS 10/31/2019     Lab Results  Component Value Date   ALBUMIN 3.7 10/31/2019   ALBUMIN 3.8 10/18/2019   ALBUMIN 4.3 02/24/2018    No results found for: MG No results found for: VD25OH  No results found for: PREALBUMIN CBC EXTENDED Latest Ref Rng & Units 10/31/2019 10/18/2019 06/14/2019  WBC 4.0 - 10.5 K/uL 8.8 5.4 12.0(H)  RBC 4.22 - 5.81 MIL/uL 4.57 4.65 4.21(L)  HGB 13.0 - 17.0 g/dL 13.2 13.5 12.4(L)  HCT 39.0 - 52.0 % 40.9 41.5 36.7(L)  PLT 150 - 400 K/uL 327 319 266  NEUTROABS 1.7 - 7.7 K/uL 6.2 - 9.5(H)  LYMPHSABS 0.7 - 4.0 K/uL 1.6 - 1.2     Body mass index is 35 kg/m.  Orders:  No orders of the defined types were placed in this encounter.  Meds ordered this encounter  Medications  . pentoxifylline (TRENTAL) 400 MG CR tablet  Sig: Take 1 tablet (400 mg total) by mouth 3 (three) times daily with meals.    Dispense:  90 tablet    Refill:  3  . ciprofloxacin (CIPRO) 500 MG tablet    Sig: Take 1 tablet (500 mg total) by mouth 2 (two) times daily for 10 days.    Dispense:  20 tablet    Refill:  0  . gabapentin (NEURONTIN) 300 MG capsule    Sig: Take 1 capsule (300 mg total) by mouth 3 (three) times daily.    Dispense:  60 capsule    Refill:  0  . oxyCODONE-acetaminophen (PERCOCET) 7.5-325 MG tablet    Sig: Take 1 tablet by mouth every 4 (four) hours as needed for severe pain.    Dispense:  30 tablet    Refill:  0     Procedures: No procedures performed  Clinical Data: No additional findings.  ROS:  All other systems  negative, except as noted in the HPI. Review of Systems  Objective: Vital Signs: Ht 6\' 3"  (1.905 m)   Wt 280 lb (127 kg)   BMI 35.00 kg/m   Specialty Comments:  No specialty comments available.  PMFS History: Patient Active Problem List   Diagnosis Date Noted  . Rupture of operation wound   . Scrotum swelling 06/13/2019  . Exocrine pancreatic insufficiency 06/14/2018  . Uncontrolled diabetes mellitus with hyperglycemia, with long-term current use of insulin ([place]) 02/24/2018  . Alcohol use disorder, severe, dependence ([place]) 02/24/2018  . Anxiety associated with depression 02/24/2018  . GERD (gastroesophageal reflux disease) 02/24/2018  . Anemia 02/24/2018  . Asthma, intermittent 02/24/2018  . Tobacco use disorder 02/24/2018   Past Medical History:  Diagnosis Date  . Alcoholism ([place])   . Anemia   . Anxiety   . Arthritis   . Asthma   . COPD (chronic obstructive pulmonary disease) ([place])   . Depression   . Diabetes ([place])    type 2  . GERD (gastroesophageal reflux disease)   . History of hiatal hernia   . HTN (hypertension)   . Osteomyelitis ([place])    right forefoot  . Pancreatitis   . Pneumonia   . Wears glasses     Family History  Problem Relation Age of Onset  . Diabetes Mother   . Hypertension Mother   . Hyperlipidemia Mother   . Kidney disease Mother   . Thyroid disease Mother   . Breast cancer Mother        mets  . Lung cancer Mother   . Diabetes Father   . Alcohol abuse Sister   . Sickle cell trait Sister   . Diabetes Brother   . Asthma Brother   . Lung cancer Maternal Grandmother   . Kidney disease Maternal Grandmother   . Liver cancer Maternal Uncle        x 4-5  . Kidney disease Maternal Uncle   . Kidney disease Paternal Uncle     Past Surgical History:  Procedure Laterality Date  . 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 AMPUTATION Right 10/18/2019   Procedure: RIGHT SECOND TOE AMPUTATION;  Surgeon: Newt Minion, MD;  Location:  [facility];  Service: Orthopedics;  Laterality: Right;  . AMPUTATION Right 11/08/2019   Procedure: RIGHT TRANSMETATARSAL AMPUTATION;  Surgeon: Newt Minion, MD;  Location: [place] [facility];  Service: Orthopedics;  Laterality: Right;  . APPENDECTOMY    . CHOLECYSTECTOMY  2006   with gallbladder and spleen  .  HERNIA REPAIR  2008, 2010   hiatal hernia and 1 additional  . PANCREATIC PSEUDOCYST DRAINAGE    . SPLENECTOMY  2006  . TOTAL KNEE ARTHROPLASTY Right 1982, 1984   Social History   Occupational History  . Not on file  Tobacco Use  . Smoking status: Current Every Day Smoker    Packs/day: 0.50  . Smokeless tobacco: Never Used  Substance and Sexual Activity  . Alcohol use: Not Currently    Comment: 2-3 per day, none since 2019  . Drug use: Not Currently  . Sexual activity: Not on file

## 2019-11-23 ENCOUNTER — Other Ambulatory Visit: Payer: Self-pay

## 2019-11-23 ENCOUNTER — Other Ambulatory Visit (HOSPITAL_COMMUNITY)
Admission: RE | Admit: 2019-11-23 | Discharge: 2019-11-23 | Disposition: A | Discharge status: Home / Self Care | Payer: 59 | Source: Ambulatory Visit | Attending: Orthopedic Surgery | Admitting: Orthopedic Surgery

## 2019-11-23 ENCOUNTER — Encounter (HOSPITAL_COMMUNITY): Payer: Self-pay | Admitting: Orthopedic Surgery

## 2019-11-23 DIAGNOSIS — Z20822 Contact with and (suspected) exposure to covid-19: Secondary | ICD-10-CM | POA: Insufficient documentation

## 2019-11-23 LAB — SARS CORONAVIRUS 2 (TAT 6-24 HRS): SARS Coronavirus 2: NEGATIVE

## 2019-11-23 MED ORDER — DEXTROSE 5 % IV SOLN
3.0000 g | INTRAVENOUS | Status: AC
  Administered 2019-11-24: 2 g via INTRAVENOUS
  Filled 2019-11-23: qty 3

## 2019-11-23 MED FILL — metFORMIN HCL 1000 MG TABS: 1000 | 90 days supply | Qty: 180 | Fill #2

## 2019-11-23 NOTE — Anesthesia Preprocedure Evaluation (Addendum)
Anesthesia Evaluation  Patient identified by MRN, date of birth, ID band Patient awake    Reviewed: Allergy & Precautions, H&P , NPO status , Patient's Chart, lab work & pertinent test results  Airway Mallampati: II  TM Distance: >3 FB Neck ROM: Full    Dental no notable dental hx. (+) Dental Advisory Given, Missing, Chipped,    Pulmonary asthma , COPD,  COPD inhaler, Current Smoker and Patient abstained from smoking., former smoker,    Pulmonary exam normal breath sounds clear to auscultation       Cardiovascular hypertension,  Rhythm:Regular Rate:Normal     Neuro/Psych Anxiety Depression negative neurological ROS     GI/Hepatic hiatal hernia, GERD  ,(+)     substance abuse  alcohol use,   Endo/Other  diabetes, Insulin Dependent, Oral Hypoglycemic AgentsMorbid obesity  Renal/GU negative Renal ROS  negative genitourinary   Musculoskeletal  (+) Arthritis ,   Abdominal   Peds  Hematology  (+) Blood dyscrasia, anemia ,   Anesthesia Other Findings   Reproductive/Obstetrics negative OB ROS                           Anesthesia Physical  Anesthesia Plan  ASA: III  Anesthesia Plan: General and Regional   Post-op Pain Management:  Regional for Post-op pain   Induction: Intravenous  PONV Risk Score and Plan: 3 and Ondansetron, Midazolam and Treatment may vary due to age or medical condition  Airway Management Planned: LMA  Additional Equipment:   Intra-op Plan:   Post-operative Plan:   Informed Consent: I have reviewed the patients History and Physical, chart, labs and discussed the procedure including the risks, benefits and alternatives for the proposed anesthesia with the patient or authorized representative who has indicated his/her understanding and acceptance.     Dental advisory given  Plan Discussed with: CRNA  Anesthesia Plan Comments:        Anesthesia Quick  Evaluation

## 2019-11-23 NOTE — Progress Notes (Signed)
Spoke with pt for pre-op call. Pt denies cardiac history. Pt is a type 2 diabetic, last A1C was 9.2 on 08/30/19. He states his fasting blood sugar is usually in the 140's. Instructed pt to take 1/2 of his normal dose of Levemir insulin in the AM, will take 10 units. If blood sugar is >220 take 1/2 of usual correction dose of Humalog insulin. If blood sugar is 70 or below, treat with 1/2 cup of clear juice (apple or cranberry) and recheck blood sugar 15 minutes after drinking juice. Pt voiced understanding  Pt had his Covid test done today. He understands he needs to be in quarantine until he comes to the hospital in the AM.   Instructed pt to not eat food after midnight, tonight. May have clear liquids until 4:30 AM and to drink 10 ounces of water just prior to 4:30 AM Friday per ERAS protocol. Pt voiced understanding.

## 2019-11-24 ENCOUNTER — Other Ambulatory Visit: Payer: Self-pay | Admitting: Physician Assistant

## 2019-11-24 ENCOUNTER — Encounter (HOSPITAL_COMMUNITY): Payer: Self-pay | Admitting: Orthopedic Surgery

## 2019-11-24 ENCOUNTER — Ambulatory Visit (HOSPITAL_COMMUNITY)
Admission: RE | Admit: 2019-11-24 | Discharge: 2019-11-24 | Disposition: A | Discharge status: Home / Self Care | Payer: 59 | Attending: Orthopedic Surgery | Admitting: Orthopedic Surgery

## 2019-11-24 ENCOUNTER — Ambulatory Visit (HOSPITAL_COMMUNITY): Payer: 59 | Admitting: Anesthesiology

## 2019-11-24 ENCOUNTER — Ambulatory Visit: Payer: 59 | Admitting: Family Medicine

## 2019-11-24 ENCOUNTER — Other Ambulatory Visit: Payer: Self-pay

## 2019-11-24 ENCOUNTER — Encounter (HOSPITAL_COMMUNITY)
Admission: RE | Disposition: A | Discharge status: Home / Self Care | Payer: Self-pay | Source: Home / Self Care | Attending: Orthopedic Surgery

## 2019-11-24 ENCOUNTER — Telehealth: Payer: Self-pay | Admitting: Orthopedic Surgery

## 2019-11-24 DIAGNOSIS — Z8249 Family history of ischemic heart disease and other diseases of the circulatory system: Secondary | ICD-10-CM | POA: Diagnosis not present

## 2019-11-24 DIAGNOSIS — Z885 Allergy status to narcotic agent status: Secondary | ICD-10-CM | POA: Insufficient documentation

## 2019-11-24 DIAGNOSIS — X58XXXA Exposure to other specified factors, initial encounter: Secondary | ICD-10-CM | POA: Insufficient documentation

## 2019-11-24 DIAGNOSIS — T8781 Dehiscence of amputation stump: Secondary | ICD-10-CM

## 2019-11-24 DIAGNOSIS — Z9081 Acquired absence of spleen: Secondary | ICD-10-CM | POA: Insufficient documentation

## 2019-11-24 DIAGNOSIS — F329 Major depressive disorder, single episode, unspecified: Secondary | ICD-10-CM | POA: Insufficient documentation

## 2019-11-24 DIAGNOSIS — Z91012 Allergy to eggs: Secondary | ICD-10-CM | POA: Insufficient documentation

## 2019-11-24 DIAGNOSIS — J449 Chronic obstructive pulmonary disease, unspecified: Secondary | ICD-10-CM | POA: Insufficient documentation

## 2019-11-24 DIAGNOSIS — F419 Anxiety disorder, unspecified: Secondary | ICD-10-CM | POA: Diagnosis not present

## 2019-11-24 DIAGNOSIS — Z801 Family history of malignant neoplasm of trachea, bronchus and lung: Secondary | ICD-10-CM | POA: Insufficient documentation

## 2019-11-24 DIAGNOSIS — E1165 Type 2 diabetes mellitus with hyperglycemia: Secondary | ICD-10-CM | POA: Diagnosis not present

## 2019-11-24 DIAGNOSIS — Z96651 Presence of right artificial knee joint: Secondary | ICD-10-CM | POA: Insufficient documentation

## 2019-11-24 DIAGNOSIS — M1990 Unspecified osteoarthritis, unspecified site: Secondary | ICD-10-CM | POA: Insufficient documentation

## 2019-11-24 DIAGNOSIS — Z8349 Family history of other endocrine, nutritional and metabolic diseases: Secondary | ICD-10-CM | POA: Insufficient documentation

## 2019-11-24 DIAGNOSIS — K219 Gastro-esophageal reflux disease without esophagitis: Secondary | ICD-10-CM | POA: Insufficient documentation

## 2019-11-24 DIAGNOSIS — K449 Diaphragmatic hernia without obstruction or gangrene: Secondary | ICD-10-CM | POA: Insufficient documentation

## 2019-11-24 DIAGNOSIS — Z89421 Acquired absence of other right toe(s): Secondary | ICD-10-CM | POA: Insufficient documentation

## 2019-11-24 DIAGNOSIS — D649 Anemia, unspecified: Secondary | ICD-10-CM | POA: Diagnosis not present

## 2019-11-24 DIAGNOSIS — F17210 Nicotine dependence, cigarettes, uncomplicated: Secondary | ICD-10-CM | POA: Diagnosis not present

## 2019-11-24 DIAGNOSIS — Z794 Long term (current) use of insulin: Secondary | ICD-10-CM | POA: Insufficient documentation

## 2019-11-24 DIAGNOSIS — Z803 Family history of malignant neoplasm of breast: Secondary | ICD-10-CM | POA: Insufficient documentation

## 2019-11-24 DIAGNOSIS — Z1619 Resistance to other specified beta lactam antibiotics: Secondary | ICD-10-CM | POA: Insufficient documentation

## 2019-11-24 DIAGNOSIS — Z832 Family history of diseases of the blood and blood-forming organs and certain disorders involving the immune mechanism: Secondary | ICD-10-CM | POA: Insufficient documentation

## 2019-11-24 DIAGNOSIS — B9689 Other specified bacterial agents as the cause of diseases classified elsewhere: Secondary | ICD-10-CM | POA: Diagnosis not present

## 2019-11-24 DIAGNOSIS — Z841 Family history of disorders of kidney and ureter: Secondary | ICD-10-CM | POA: Insufficient documentation

## 2019-11-24 DIAGNOSIS — Z9049 Acquired absence of other specified parts of digestive tract: Secondary | ICD-10-CM | POA: Insufficient documentation

## 2019-11-24 DIAGNOSIS — Z833 Family history of diabetes mellitus: Secondary | ICD-10-CM | POA: Insufficient documentation

## 2019-11-24 DIAGNOSIS — Z91018 Allergy to other foods: Secondary | ICD-10-CM | POA: Insufficient documentation

## 2019-11-24 DIAGNOSIS — I10 Essential (primary) hypertension: Secondary | ICD-10-CM | POA: Insufficient documentation

## 2019-11-24 DIAGNOSIS — Z811 Family history of alcohol abuse and dependence: Secondary | ICD-10-CM | POA: Diagnosis not present

## 2019-11-24 DIAGNOSIS — Z825 Family history of asthma and other chronic lower respiratory diseases: Secondary | ICD-10-CM | POA: Insufficient documentation

## 2019-11-24 DIAGNOSIS — G8918 Other acute postprocedural pain: Secondary | ICD-10-CM | POA: Diagnosis not present

## 2019-11-24 HISTORY — PX: STUMP REVISION: SHX6102

## 2019-11-24 LAB — CBC
HCT: 36.3 % — ABNORMAL LOW (ref 39.0–52.0)
Hemoglobin: 11.5 g/dL — ABNORMAL LOW (ref 13.0–17.0)
MCH: 28 pg (ref 26.0–34.0)
MCHC: 31.7 g/dL (ref 30.0–36.0)
MCV: 88.5 fL (ref 80.0–100.0)
Platelets: 489 10*3/uL — ABNORMAL HIGH (ref 150–400)
RBC: 4.1 MIL/uL — ABNORMAL LOW (ref 4.22–5.81)
RDW: 13.2 % (ref 11.5–15.5)
WBC: 8 10*3/uL (ref 4.0–10.5)
nRBC: 0 % (ref 0.0–0.2)

## 2019-11-24 LAB — BASIC METABOLIC PANEL
Anion gap: 13 (ref 5–15)
BUN: 13 mg/dL (ref 6–20)
CO2: 23 mmol/L (ref 22–32)
Calcium: 9 mg/dL (ref 8.9–10.3)
Chloride: 99 mmol/L (ref 98–111)
Creatinine, Ser: 1.04 mg/dL (ref 0.61–1.24)
GFR calc Af Amer: >60 mL/min (ref >60)
GFR calc non Af Amer: >60 mL/min (ref >60)
Glucose, Bld: 182 mg/dL — ABNORMAL HIGH (ref 70–99)
Potassium: 4.3 mmol/L (ref 3.5–5.1)
Sodium: 135 mmol/L (ref 135–145)

## 2019-11-24 LAB — GLUCOSE, CAPILLARY
Glucose-Capillary: 197 mg/dL — ABNORMAL HIGH (ref 70–99)
Glucose-Capillary: 146 mg/dL — ABNORMAL HIGH (ref 70–99)

## 2019-11-24 SURGERY — REVISION, AMPUTATION SITE
Anesthesia: Regional | Laterality: Right

## 2019-11-24 MED ORDER — PHENYLEPHRINE 40 MCG/ML (10ML) SYRINGE FOR IV PUSH (FOR BLOOD PRESSURE SUPPORT)
PREFILLED_SYRINGE | INTRAVENOUS | Status: AC
  Filled 2019-11-24: qty 10

## 2019-11-24 MED ORDER — MIDAZOLAM HCL 2 MG/2ML IJ SOLN
INTRAMUSCULAR | Status: AC
  Filled 2019-11-24: qty 2

## 2019-11-24 MED ORDER — PROPOFOL 10 MG/ML IV BOLUS
INTRAVENOUS | Status: AC
  Filled 2019-11-24: qty 20

## 2019-11-24 MED ORDER — ACETAMINOPHEN 500 MG PO TABS
1000.0000 mg | ORAL_TABLET | Freq: Once | ORAL | Status: AC
  Administered 2019-11-24: 1000 mg via ORAL
  Filled 2019-11-24: qty 2

## 2019-11-24 MED ORDER — SODIUM CHLORIDE 0.9 % IR SOLN
Status: DC | PRN
  Administered 2019-11-24: 1000 mL

## 2019-11-24 MED ORDER — MIDAZOLAM HCL 2 MG/2ML IJ SOLN
INTRAMUSCULAR | Status: DC | PRN
  Administered 2019-11-24: 2 mg via INTRAVENOUS

## 2019-11-24 MED ORDER — LACTATED RINGERS IV SOLN: INTRAVENOUS | Status: DC | PRN

## 2019-11-24 MED ORDER — DEXMEDETOMIDINE HCL IN NACL 200 MCG/50ML IV SOLN
INTRAVENOUS | Status: AC
  Filled 2019-11-24: qty 50

## 2019-11-24 MED ORDER — FENTANYL CITRATE (PF) 100 MCG/2ML IJ SOLN
INTRAMUSCULAR | Status: AC
  Filled 2019-11-24: qty 2

## 2019-11-24 MED ORDER — ONDANSETRON HCL 4 MG/2ML IJ SOLN
INTRAMUSCULAR | Status: DC | PRN
  Administered 2019-11-24: 4 mg via INTRAVENOUS

## 2019-11-24 MED ORDER — PHENYLEPHRINE 40 MCG/ML (10ML) SYRINGE FOR IV PUSH (FOR BLOOD PRESSURE SUPPORT)
PREFILLED_SYRINGE | INTRAVENOUS | Status: DC | PRN
  Administered 2019-11-24: 120 ug via INTRAVENOUS
  Administered 2019-11-24: 80 ug via INTRAVENOUS

## 2019-11-24 MED ORDER — CHLORHEXIDINE GLUCONATE 4 % EX LIQD: 60.0000 mL | Freq: Once | CUTANEOUS | Status: DC

## 2019-11-24 MED ORDER — PROPOFOL 10 MG/ML IV BOLUS
INTRAVENOUS | Status: DC | PRN
  Administered 2019-11-24: 200 mg via INTRAVENOUS

## 2019-11-24 MED ORDER — DEXMEDETOMIDINE HCL IN NACL 200 MCG/50ML IV SOLN
INTRAVENOUS | Status: DC | PRN
  Administered 2019-11-24 (×2): 8 ug via INTRAVENOUS

## 2019-11-24 MED ORDER — FENTANYL CITRATE (PF) 100 MCG/2ML IJ SOLN
25.0000 ug | INTRAMUSCULAR | Status: DC | PRN
  Administered 2019-11-24 (×2): 50 ug via INTRAVENOUS

## 2019-11-24 MED ORDER — FENTANYL CITRATE (PF) 250 MCG/5ML IJ SOLN
INTRAMUSCULAR | Status: AC
  Filled 2019-11-24: qty 5

## 2019-11-24 MED ORDER — OXYCODONE-ACETAMINOPHEN 7.5-325 MG PO TABS: 1.0000 | ORAL_TABLET | ORAL | 0 refills | Status: DC | PRN

## 2019-11-24 MED ORDER — FENTANYL CITRATE (PF) 250 MCG/5ML IJ SOLN
INTRAMUSCULAR | Status: DC | PRN
  Administered 2019-11-24 (×5): 50 ug via INTRAVENOUS

## 2019-11-24 MED ORDER — DEXAMETHASONE SODIUM PHOSPHATE 10 MG/ML IJ SOLN
INTRAMUSCULAR | Status: AC
  Filled 2019-11-24: qty 1

## 2019-11-24 MED ORDER — ROPIVACAINE HCL 5 MG/ML IJ SOLN
INTRAMUSCULAR | Status: DC | PRN
  Administered 2019-11-24: 20 mL via EPIDURAL
  Administered 2019-11-24: 30 mL via EPIDURAL

## 2019-11-24 MED ORDER — LIDOCAINE 2% (20 MG/ML) 5 ML SYRINGE
INTRAMUSCULAR | Status: AC
  Filled 2019-11-24: qty 5

## 2019-11-24 MED ORDER — DEXAMETHASONE SODIUM PHOSPHATE 10 MG/ML IJ SOLN
INTRAMUSCULAR | Status: DC | PRN
  Administered 2019-11-24: 5 mg via INTRAVENOUS
  Administered 2019-11-24: 4 mg via INTRAVENOUS
  Administered 2019-11-24: 5 mg via INTRAVENOUS

## 2019-11-24 MED ORDER — LIDOCAINE 2% (20 MG/ML) 5 ML SYRINGE
INTRAMUSCULAR | Status: DC | PRN
  Administered 2019-11-24: 60 mg via INTRAVENOUS

## 2019-11-24 MED ORDER — ONDANSETRON HCL 4 MG/2ML IJ SOLN
INTRAMUSCULAR | Status: AC
  Filled 2019-11-24: qty 2

## 2019-11-24 MED FILL — OXYCODONE-APAP 7.5-325MG: 7.5-325 | 5 days supply | Qty: 30 | Fill #0

## 2019-11-24 MED FILL — GABAPENTIN 300 MG CAPSULE: 300 | 20 days supply | Qty: 60 | Fill #0

## 2019-11-24 SURGICAL SUPPLY — 22 items
BLADE SURG 21 STRL SS (BLADE) ×3 IMPLANT
BNDG COHESIVE 6X5 TAN NS LF (GAUZE/BANDAGES/DRESSINGS) ×3 IMPLANT
COVER SURGICAL LIGHT HANDLE (MISCELLANEOUS) ×3 IMPLANT
DRAPE EXTREMITY T 121X128X90 (DISPOSABLE) ×3 IMPLANT
DRAPE U-SHAPE 47X51 STRL (DRAPES) ×6 IMPLANT
DURAPREP 26ML APPLICATOR (WOUND CARE) ×3 IMPLANT
ELECT REM PT RETURN 9FT ADLT (ELECTROSURGICAL) ×3
ELECTRODE REM PT RTRN 9FT ADLT (ELECTROSURGICAL) ×1 IMPLANT
GLOVE BIOGEL PI IND STRL 9 (GLOVE) ×1 IMPLANT
GLOVE BIOGEL PI INDICATOR 9 (GLOVE) ×2
GLOVE SURG ORTHO 9.0 STRL STRW (GLOVE) ×3 IMPLANT
GOWN STRL REUS W/ TWL XL LVL3 (GOWN DISPOSABLE) ×2 IMPLANT
GOWN STRL REUS W/TWL XL LVL3 (GOWN DISPOSABLE) ×4
KIT BASIN OR (CUSTOM PROCEDURE TRAY) ×3 IMPLANT
KIT TURNOVER KIT B (KITS) ×3 IMPLANT
MANIFOLD NEPTUNE II (INSTRUMENTS) ×3 IMPLANT
NS IRRIG 1000ML POUR BTL (IV SOLUTION) ×3 IMPLANT
PACK GENERAL/GYN (CUSTOM PROCEDURE TRAY) ×3 IMPLANT
PAD ARMBOARD 7.5X6 YLW CONV (MISCELLANEOUS) ×3 IMPLANT
PREVENA RESTOR ARTHOFORM 33X30 (CANNISTER) ×3 IMPLANT
SUT ETHILON 2 0 PSLX (SUTURE) ×6 IMPLANT
TOWEL GREEN STERILE (TOWEL DISPOSABLE) ×3 IMPLANT

## 2019-11-24 NOTE — Anesthesia Procedure Notes (Signed)
Anesthesia Regional Block: Adductor canal block   Pre-Anesthetic Checklist: ,, timeout performed, Correct Patient, Correct Site, Correct Laterality, Correct Procedure, Correct Position, site marked, Risks and benefits discussed,  Surgical consent,  Pre-op evaluation,  At surgeon's request and post-op pain management  Laterality: Right  Prep: Maximum Sterile Barrier Precautions used, chloraprep       Needles:  Injection technique: Single-shot  Needle Type: Echogenic Stimulator Needle     Needle Length: 9cm  Needle Gauge: 22     Additional Needles:   Procedures:,,,, ultrasound used (permanent image in chart),,,,  Narrative:  Start time: 11/24/2019 7:10 AM End time: 11/24/2019 7:15 AM Injection made incrementally with aspirations every 5 mL.  Performed by: Personally  Anesthesiologist: Freddrick March, MD  Additional Notes: Monitors applied. No increased pain on injection. No increased resistance to injection. Injection made in 5cc increments. Good needle visualization. Patient tolerated procedure well.

## 2019-11-24 NOTE — Op Note (Signed)
11/24/2019  8:28 AM  PATIENT:  Christopher Fernandez    PRE-OPERATIVE DIAGNOSIS:  Dehiscence Right Transmetatarsal Amputation  POST-OPERATIVE DIAGNOSIS:  Same  PROCEDURE:  REVISION RIGHT TRANSMETATARSAL AMPUTATION  SURGEON:  Newt Minion, MD  PHYSICIAN ASSISTANT:None ANESTHESIA:   General  PREOPERATIVE INDICATIONS:  Christopher Fernandez is a  49 y.o. male with a diagnosis of Dehiscence Right Transmetatarsal Amputation who failed conservative measures and elected for surgical management.    The risks benefits and alternatives were discussed with the patient preoperatively including but not limited to the risks of infection, bleeding, nerve injury, cardiopulmonary complications, the need for revision surgery, among others, and the patient was willing to proceed.  OPERATIVE IMPLANTS: Praveena wound VAC 13 cm  @ENCIMAGES @  OPERATIVE FINDINGS: Necrotic tissue that extended down to bone.  Tissue was sent for cultures.  Antibiotics started after cultures obtained  OPERATIVE PROCEDURE: Patient was brought the operating room and underwent a general anesthetic.  After adequate levels anesthesia were obtained patient's right lower extremity was prepped using DuraPrep draped into a sterile field a timeout was called.  A fishmouth incision was made proximal to the necrotic tissue this was carried sharply down to bone and the distal centimeter of bone was resected tissue was sent for cultures.  Further debridement was used to obtain healthy viable margins.  The wound was irrigated with normal saline electrocautery was used for hemostasis.  The incision was closed using 2-0 nylon a wound VAC was applied this had a good suction fit patient was extubated taken the PACU in stable condition.   DISCHARGE PLANNING:  Antibiotic duration: Preoperative antibiotics will modify antibiotics pending the results of cultures  Weightbearing: Touchdown weightbearing on the right  Pain medication: Prescription for  Percocet  Dressing care/ Wound VAC: Continue wound VAC for 1 week  Ambulatory devices: Crutches or walker  Discharge to: Home.  Follow-up: In the office 1 week post operative.

## 2019-11-24 NOTE — Anesthesia Postprocedure Evaluation (Signed)
Anesthesia Post Note  Patient: Christopher Fernandez  Procedure(s) Performed: REVISION RIGHT TRANSMETATARSAL AMPUTATION (Right )     Patient location during evaluation: PACU Anesthesia Type: Regional and General Level of consciousness: awake and alert Pain management: pain level controlled Vital Signs Assessment: post-procedure vital signs reviewed and stable Respiratory status: spontaneous breathing, nonlabored ventilation, respiratory function stable and patient connected to nasal cannula oxygen Cardiovascular status: blood pressure returned to baseline and stable Postop Assessment: no apparent nausea or vomiting Anesthetic complications: no    Last Vitals:  Vitals:   11/24/19 0903 11/24/19 0904  BP: 111/88   Pulse: 81 80  Resp: (!) 7 11  Temp:    SpO2: 99% 97%    Last Pain:  Vitals:   11/24/19 0900  PainSc: 8                  Glema Takaki L Daksha Koone

## 2019-11-24 NOTE — Anesthesia Procedure Notes (Signed)
Anesthesia Regional Block: Popliteal block   Pre-Anesthetic Checklist: ,, timeout performed, Correct Patient, Correct Site, Correct Laterality, Correct Procedure, Correct Position, site marked, Risks and benefits discussed,  Surgical consent,  Pre-op evaluation,  At surgeon's request and post-op pain management  Laterality: Right  Prep: Maximum Sterile Barrier Precautions used, chloraprep       Needles:  Injection technique: Single-shot  Needle Type: Echogenic Stimulator Needle     Needle Length: 9cm  Needle Gauge: 22     Additional Needles:   Procedures:,,,, ultrasound used (permanent image in chart),,,,  Narrative:  Start time: 11/24/2019 7:00 AM End time: 11/24/2019 7:09 AM Injection made incrementally with aspirations every 5 mL.  Performed by: Personally  Anesthesiologist: Freddrick March, MD  Additional Notes: Monitors applied. No increased pain on injection. No increased resistance to injection. Injection made in 5cc increments. Good needle visualization. Patient tolerated procedure well.

## 2019-11-24 NOTE — Transfer of Care (Signed)
Immediate Anesthesia Transfer of Care Note  Patient: Christopher Fernandez  Procedure(s) Performed: REVISION RIGHT TRANSMETATARSAL AMPUTATION (Right )  Patient Location: PACU  Anesthesia Type:General and GA combined with regional for post-op pain  Level of Consciousness: awake, alert  and oriented  Airway & Oxygen Therapy: Patient Spontanous Breathing  Post-op Assessment: Report given to RN, Post -op Vital signs reviewed and stable and Patient moving all extremities  Post vital signs: Reviewed and stable  Last Vitals:  Vitals Value Taken Time  BP 126/92 11/24/19 0832  Temp    Pulse 90 11/24/19 0834  Resp 11 11/24/19 0834  SpO2 99 % 11/24/19 0834  Vitals shown include unvalidated device data.  Last Pain:  Vitals:   11/24/19 0617  PainSc: 4          Complications: No apparent anesthesia complications

## 2019-11-24 NOTE — Anesthesia Procedure Notes (Signed)
Procedure Name: LMA Insertion Date/Time: 11/24/2019 7:53 AM Performed by: Renato Shin, CRNA Pre-anesthesia Checklist: Patient identified, Emergency Drugs available, Suction available and Patient being monitored Patient Re-evaluated:Patient Re-evaluated prior to induction Oxygen Delivery Method: Circle system utilized Preoxygenation: Pre-oxygenation with 100% oxygen Induction Type: IV induction LMA: LMA inserted LMA Size: 4.0 Number of attempts: 1 Placement Confirmation: breath sounds checked- equal and bilateral Tube secured with: Tape Dental Injury: Teeth and Oropharynx as per pre-operative assessment

## 2019-11-24 NOTE — Telephone Encounter (Signed)
I called and sw pt and he advised that he had surgery today and that he was advised by the pharmacy that his medication was not going to be covered. I called [facility] outpatient pharm and was advised that they did not receive the rx. I spoke with PA and she resubmit the rx to the pharm and then I called the pt to advise that this has been done and that he can pick up the rx today.

## 2019-11-24 NOTE — H&P (Signed)
Christopher Fernandez is an 49 y.o. male.   Chief Complaint: Right Transmetatarsal Wound dehiscence HPI:  This is a pleasant gentleman who is now 2 weeks status post right transmetatarsal amputation.  He is currently smoking a half a pack of cigarettes daily.  He is discouraged because he still continues to have a lot of nerve and pain in the foot and feels it is not progressing with healing Past Medical History:  Diagnosis Date  . Alcoholism ([place])   . Anemia   . Anxiety   . Arthritis   . Asthma   . COPD (chronic obstructive pulmonary disease) ([place])   . Depression   . Diabetes ([place])    type 2  . GERD (gastroesophageal reflux disease)   . History of hiatal hernia   . HTN (hypertension)   . 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 Pancreatitis   . Pneumonia   . Wears glasses     Past Surgical History:  Procedure Laterality Date  . 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 AMPUTATION Right 10/18/2019   Procedure: RIGHT SECOND TOE AMPUTATION;  Surgeon: Newt Minion, MD;  Location: [facility];  Service: Orthopedics;  Laterality: Right;  . AMPUTATION Right 11/08/2019   Procedure: RIGHT TRANSMETATARSAL AMPUTATION;  Surgeon: Newt Minion, MD;  Location: [facility];  Service: Orthopedics;  Laterality: Right;  . APPENDECTOMY    . CHOLECYSTECTOMY  2006   with gallbladder and spleen  . HERNIA REPAIR  2008, 2010   hiatal hernia and 1 additional  . PANCREATIC PSEUDOCYST DRAINAGE    . SPLENECTOMY  2006  . TOTAL KNEE ARTHROPLASTY Right 1982, 1984    Family History  Problem Relation Age of Onset  . Diabetes Mother   . Hypertension Mother   . Hyperlipidemia Mother   . Kidney disease Mother   . Thyroid disease Mother   . Breast cancer Mother        mets  . Lung cancer Mother   . Diabetes Father   . Alcohol abuse Sister   . Sickle cell trait Sister   . Diabetes Brother   . Asthma Brother   . Lung cancer Maternal Grandmother   . Kidney disease Maternal Grandmother   . Liver cancer  Maternal Uncle        x 4-5  . Kidney disease Maternal Uncle   . Kidney disease Paternal Uncle    Social History:  reports that he quit smoking 2 days ago. He smoked 0.50 packs per day. He has never used smokeless tobacco. He reports previous alcohol use. He reports previous drug use.  Allergies:  Allergies  Allergen Reactions  . Eggs Or Egg-Derived Products Rash  . Morphine And Related Other (See Comments)    Cant take because of pancreatitis  . Cocoa Rash    No medications prior to admission.    Results for orders placed or performed during the hospital encounter of 11/23/19 (from the past 48 hour(s))  SARS CORONAVIRUS 2 (TAT 6-24 HRS) Nasopharyngeal Nasopharyngeal Swab     Status: None   Collection Time: 11/23/19  8:26 AM   Specimen: Nasopharyngeal Swab  Result Value Ref Range   SARS Coronavirus 2 NEGATIVE NEGATIVE    Comment: (NOTE) SARS-CoV-2 target nucleic acids are NOT DETECTED. The SARS-CoV-2 RNA is generally detectable in upper and lower respiratory specimens during the acute phase of infection. Negative results do not preclude SARS-CoV-2 infection, do not rule out co-infections with other pathogens,  and should not be used as the sole basis for treatment or other patient management decisions. Negative results must be combined with clinical observations, patient history, and epidemiological information. The expected result is Negative. Fact Sheet for Patients: SugarRoll.be Fact Sheet for Healthcare Providers: https://www.woods-mathews.com/ This test is not yet approved or cleared by the [place] FDA and  has been authorized for detection and/or diagnosis of SARS-CoV-2 by FDA under an Emergency Use Authorization (EUA). This EUA will remain  in effect (meaning this test can be used) for the duration of the COVID-19 declaration under Section 56 4(b)(1) of the Act, 21 U.S.C. section 360bbb-3(b)(1), unless the authorization  is terminated or revoked sooner. Performed at [facility], [place] [address], [place], [place] [address]    No results found.  Review of Systems  All other systems reviewed and are negative.   There were no vitals taken for this visit. Physical Exam  Patient is alert, oriented, no adenopathy, well-dressed, normal affect, normal respiratory effort. He had a strong dorsal and posterior tibial tendon biphasic Doppler pulses.  Examination of the wound demonstrates dehiscence and nonviable necrotic tissue with drainage.  Moderate amount of soft tissue swelling Lungs Clear Heart Regular rate and Rhythm  Assessment/Plan . Uncontrolled diabetes mellitus with hyperglycemia, with long-term current use of insulin ([place])     Plan: Patient was seen today directly by Dr. Sharol Given.  He recommends revision of his right transmetatarsal amputation this could be done later this week on Friday.  He would also like him to start some Trent tall and antibiotic.  His last cultures showed sensitivity to Cipro which we will order today.  I have also refilled his gabapentin at 300 mg 3 times daily also refilled his oxycodone reviewed the risks and surgical outcomes of the surgery with he and his wife..  We will plan for this later this week   [place], PA 11/24/2019, 4:33 AM

## 2019-11-24 NOTE — Telephone Encounter (Signed)
Patient called advised the Rx for (Percocet) was not approved by [employer]. The number to contact patient is [phone]

## 2019-11-27 ENCOUNTER — Telehealth: Payer: Self-pay | Admitting: Orthopedic Surgery

## 2019-11-27 ENCOUNTER — Other Ambulatory Visit: Payer: Self-pay | Admitting: Orthopedic Surgery

## 2019-11-27 MED ORDER — SULFAMETHOXAZOLE-TRIMETHOPRIM 800-160 MG PO TABS: 1.0000 | ORAL_TABLET | Freq: Two times a day (BID) | ORAL | 0 refills | Status: DC

## 2019-11-27 MED FILL — SULFAMETHOXAZOLE-TMP DS TAB: 800-160 | 20 days supply | Qty: 40 | Fill #0

## 2019-11-27 NOTE — Telephone Encounter (Signed)
[name], patient's wife called and wanted to know if the patient should he stop taking the ciprofloxacin.Please call patient wife to advise. [phone]

## 2019-11-27 NOTE — Telephone Encounter (Signed)
I called and lm on vm to advise that per Dr. Sharol Given yes to stop the current ABX as it is not sensitive to both bacteria that were found in the culture to take only the bactrim ds. To call with any questions.

## 2019-11-29 ENCOUNTER — Other Ambulatory Visit: Payer: Self-pay

## 2019-11-29 ENCOUNTER — Encounter: Payer: Self-pay | Admitting: Physician Assistant

## 2019-11-29 ENCOUNTER — Ambulatory Visit (INDEPENDENT_AMBULATORY_CARE_PROVIDER_SITE_OTHER): Payer: 59 | Admitting: Physician Assistant

## 2019-11-29 VITALS — Ht 75.0 in | Wt 280.0 lb

## 2019-11-29 DIAGNOSIS — T8781 Dehiscence of amputation stump: Secondary | ICD-10-CM

## 2019-11-29 LAB — AEROBIC/ANAEROBIC CULTURE W GRAM STAIN (SURGICAL/DEEP WOUND)

## 2019-11-29 NOTE — Progress Notes (Signed)
Office Visit Note   Patient: Christopher Fernandez           Date of Birth: Oct 23, 1970           MRN: [id number] Visit Date: 11/29/2019              Requested by: Danna Hefty, DO [address],  [place] [address] PCP: Danna Hefty, DO  Chief Complaint  Patient presents with  . Right Foot - Routine Post Op    11/24/19 revision right transmet amputation       HPI: [name] who follows up today status post revision right transmetatarsal amputation.  His cultures were reviewed by Dr. Sharol Given earlier this week and has been placed on a course of Bactrim.  He thinks he looks better than he has with regards to his foot.  His wife is on the phone and she is concerned that she just is not himself and just does not appear well for her.  He denies any fever or chills.  He they are both also concerned about the swelling   Assessment & Plan: Visit Diagnoses: No diagnosis found.  Plan: They will begin daily dry dressing changes.  I did emphasize the importance of nonweightbearing and elevating the leg.  I have discussed with them that we will talk to them anytime if they notice a change such as fever chills or if his malaise worsens.  I also told him that his wound looks good today however we are concerned for further infection which is why we have placed him on the Bactrim.  He will follow up specifically with Dr. Sharol Given early next week  Follow-Up Instructions: No follow-ups on file.   Ortho Exam  Patient is alert, oriented, no adenopathy, well-dressed, normal affect, normal respiratory effort. Wound VAC was removed.  There is a moderate amount of soft tissue swelling wound edges are well opposed without necrosis.  There is some odor.  No purulent drainage some bloody drainage no surrounding cellulitis or fluctuance there is macerated skin especially on the plantar surface  Imaging: No results found. No images are attached to the encounter.  Labs: Lab  Results  Component Value Date   HGBA1C 9.2 (A) 08/30/2019   HGBA1C 7.8 (A) 06/14/2018   HGBA1C 8.9 (H) 02/24/2018   REPTSTATUS 11/29/2019 FINAL 11/24/2019   GRAMSTAIN  11/24/2019    RARE WBC PRESENT, PREDOMINANTLY PMN FEW GRAM POSITIVE COCCI IN PAIRS IN CLUSTERS RARE GRAM POSITIVE RODS    CULT  11/24/2019    ABUNDANT METHICILLIN RESISTANT STAPHYLOCOCCUS AUREUS FEW SERRATIA MARCESCENS NO ANAEROBES ISOLATED Performed at [facility], [place] [address], [place], [place] [address]    LABORGA SERRATIA MARCESCENS 11/24/2019   LABORGA METHICILLIN RESISTANT STAPHYLOCOCCUS AUREUS 11/24/2019     Lab Results  Component Value Date   ALBUMIN 3.7 10/31/2019   ALBUMIN 3.8 10/18/2019   ALBUMIN 4.3 02/24/2018    No results found for: MG No results found for: VD25OH  No results found for: PREALBUMIN CBC EXTENDED Latest Ref Rng & Units 11/24/2019 10/31/2019 10/18/2019  WBC 4.0 - 10.5 K/uL 8.0 8.8 5.4  RBC 4.22 - 5.81 MIL/uL 4.10(L) 4.57 4.65  HGB 13.0 - 17.0 g/dL 11.5(L) 13.2 13.5  HCT 39.0 - 52.0 % 36.3(L) 40.9 41.5  PLT 150 - 400 K/uL 489(H) 327 319  NEUTROABS 1.7 - 7.7 K/uL - 6.2 -  LYMPHSABS 0.7 - 4.0 K/uL - 1.6 -  Body mass index is 35 kg/m.  Orders:  No orders of the defined types were placed in this encounter.  No orders of the defined types were placed in this encounter.    Procedures: No procedures performed  Clinical Data: No additional findings.  ROS:  All other systems negative, except as noted in the HPI. Review of Systems  Objective: Vital Signs: Ht 6\' 3"  (1.905 m)   Wt 280 lb (127 kg)   BMI 35.00 kg/m   Specialty Comments:  No specialty comments available.  PMFS History: Patient Active Problem List   Diagnosis Date Noted  . Dehiscence of amputation stump ([place])   . Rupture of operation wound   . Scrotum swelling 06/13/2019  . Exocrine pancreatic insufficiency 06/14/2018  . Uncontrolled diabetes mellitus with hyperglycemia, with long-term  current use of insulin ([place]) 02/24/2018  . Alcohol use disorder, severe, dependence ([place]) 02/24/2018  . Anxiety associated with depression 02/24/2018  . GERD (gastroesophageal reflux disease) 02/24/2018  . Anemia 02/24/2018  . Asthma, intermittent 02/24/2018  . Tobacco use disorder 02/24/2018   Past Medical History:  Diagnosis Date  . Alcoholism ([place])   . Anemia   . Anxiety   . Arthritis   . Asthma   . COPD (chronic obstructive pulmonary disease) ([place])   . Depression   . Diabetes ([place])    type 2  . GERD (gastroesophageal reflux disease)   . History of hiatal hernia   . HTN (hypertension)   . Osteomyelitis ([place])    right forefoot  . Pancreatitis   . Pneumonia   . Wears glasses     Family History  Problem Relation Age of Onset  . Diabetes Mother   . Hypertension Mother   . Hyperlipidemia Mother   . Kidney disease Mother   . Thyroid disease Mother   . Breast cancer Mother        mets  . Lung cancer Mother   . Diabetes Father   . Alcohol abuse Sister   . Sickle cell trait Sister   . Diabetes Brother   . Asthma Brother   . Lung cancer Maternal Grandmother   . Kidney disease Maternal Grandmother   . Liver cancer Maternal Uncle        x 4-5  . Kidney disease Maternal Uncle   . Kidney disease Paternal Uncle     Past Surgical History:  Procedure Laterality Date  . 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 AMPUTATION Right 10/18/2019   Procedure: RIGHT SECOND TOE AMPUTATION;  Surgeon: Newt Minion, MD;  Location: [facility];  Service: Orthopedics;  Laterality: Right;  . AMPUTATION Right 11/08/2019   Procedure: RIGHT TRANSMETATARSAL AMPUTATION;  Surgeon: Newt Minion, MD;  Location: [facility];  Service: Orthopedics;  Laterality: Right;  . APPENDECTOMY    . CHOLECYSTECTOMY  2006   with gallbladder and spleen  . HERNIA REPAIR  2008, 2010   hiatal hernia and 1 additional  . PANCREATIC PSEUDOCYST DRAINAGE    . SPLENECTOMY  2006  . STUMP REVISION Right 11/24/2019    Procedure: REVISION RIGHT TRANSMETATARSAL AMPUTATION;  Surgeon: Newt Minion, MD;  Location: [facility];  Service: Orthopedics;  Laterality: Right;  . TOTAL KNEE ARTHROPLASTY Right 1982, 1984   Social History   Occupational History  . Not on file  Tobacco Use  . Smoking status: Former Smoker    Packs/day: 0.50    Quit date: 11/22/2019    Years since quitting: 0.0  .  Smokeless tobacco: Never Used  Substance and Sexual Activity  . Alcohol use: Not Currently    Comment: 2-3 per day, none since 2019  . Drug use: Not Currently  . Sexual activity: Not on file

## 2019-11-30 ENCOUNTER — Encounter: Payer: Self-pay | Admitting: Family Medicine

## 2019-12-01 ENCOUNTER — Inpatient Hospital Stay: Payer: 59 | Admitting: Physician Assistant

## 2019-12-05 ENCOUNTER — Other Ambulatory Visit: Payer: Self-pay

## 2019-12-05 ENCOUNTER — Encounter: Payer: Self-pay | Admitting: Physician Assistant

## 2019-12-05 ENCOUNTER — Ambulatory Visit (INDEPENDENT_AMBULATORY_CARE_PROVIDER_SITE_OTHER): Payer: 59 | Admitting: Orthopedic Surgery

## 2019-12-05 VITALS — Ht 75.0 in | Wt 280.0 lb

## 2019-12-05 DIAGNOSIS — Z89431 Acquired absence of right foot: Secondary | ICD-10-CM

## 2019-12-05 DIAGNOSIS — T8781 Dehiscence of amputation stump: Secondary | ICD-10-CM

## 2019-12-05 NOTE — Progress Notes (Signed)
Office Visit Note   Patient: Christopher Fernandez           Date of Birth: 25-Jun-1971           MRN: [id number] Visit Date: 12/05/2019              Requested by: Danna Hefty, DO [address],  [place] [address] PCP: Danna Hefty, DO  Chief Complaint  Patient presents with  . Right Foot - Routine Post Op    11/24/19 revision right transmet amputation       HPI: [name] who is status post revision right transmetatarsal amputation is 2 weeks out.  Patient is currently on Bactrim DS twice a day as well as Trental 3 times a day.  Patient states that he has some slight swelling but no complaints.  Assessment & Plan: Visit Diagnoses:  1. Dehiscence of amputation stump ([place])   2. History of transmetatarsal amputation of right foot [facility])     Plan: Patient will go to biotech to get a stump shrinker to wear around-the-clock.  He is given a prescription for extra-depth shoes custom orthotics spacer and a carbon plate.  Harvest sutures at follow-up.  Follow-Up Instructions: Return in about 2 weeks (around 12/19/2019).   Ortho Exam  Patient is alert, oriented, no adenopathy, well-dressed, normal affect, normal respiratory effort. Examination patient does have some swelling there is some mild gaping of the incision from the transmetatarsal amputation there is no redness no cellulitis no drainage no odor no signs of infection.  Imaging: No results found. No images are attached to the encounter.  Labs: Lab Results  Component Value Date   HGBA1C 9.2 (A) 08/30/2019   HGBA1C 7.8 (A) 06/14/2018   HGBA1C 8.9 (H) 02/24/2018   REPTSTATUS 11/29/2019 FINAL 11/24/2019   GRAMSTAIN  11/24/2019    RARE WBC PRESENT, PREDOMINANTLY PMN FEW GRAM POSITIVE COCCI IN PAIRS IN CLUSTERS RARE GRAM POSITIVE RODS    CULT  11/24/2019    ABUNDANT METHICILLIN RESISTANT STAPHYLOCOCCUS AUREUS FEW SERRATIA MARCESCENS NO ANAEROBES ISOLATED Performed at [facility], [place] [address] [place], [place] [address]    LABORGA SERRATIA MARCESCENS 11/24/2019   LABORGA METHICILLIN RESISTANT STAPHYLOCOCCUS AUREUS 11/24/2019     Lab Results  Component Value Date   ALBUMIN 3.7 10/31/2019   ALBUMIN 3.8 10/18/2019   ALBUMIN 4.3 02/24/2018    No results found for: MG No results found for: VD25OH  No results found for: PREALBUMIN CBC EXTENDED Latest Ref Rng & Units 11/24/2019 10/31/2019 10/18/2019  WBC 4.0 - 10.5 K/uL 8.0 8.8 5.4  RBC 4.22 - 5.81 MIL/uL 4.10(L) 4.57 4.65  HGB 13.0 - 17.0 g/dL 11.5(L) 13.2 13.5  HCT 39.0 - 52.0 % 36.3(L) 40.9 41.5  PLT 150 - 400 K/uL 489(H) 327 319  NEUTROABS 1.7 - 7.7 K/uL - 6.2 -  LYMPHSABS 0.7 - 4.0 K/uL - 1.6 -     Body mass index is 35 kg/m.  Orders:  No orders of the defined types were placed in this encounter.  No orders of the defined types were placed in this encounter.    Procedures: No procedures performed  Clinical Data: No additional findings.  ROS:  All other systems negative, except as noted in the HPI. Review of Systems  Objective: Vital Signs: Ht 6\' 3"  (1.905 m)   Wt 280 lb (127 kg)   BMI 35.00 kg/m   Specialty Comments:  No specialty comments available.  PMFS History: Patient Active Problem List   Diagnosis Date Noted  . Dehiscence of amputation stump ([place])   . Rupture of operation wound   . Scrotum swelling 06/13/2019  . Exocrine pancreatic insufficiency 06/14/2018  . Uncontrolled diabetes mellitus with hyperglycemia, with long-term current use of insulin ([place]) 02/24/2018  . Alcohol use disorder, severe, dependence ([place]) 02/24/2018  . Anxiety associated with depression 02/24/2018  . GERD (gastroesophageal reflux disease) 02/24/2018  . Anemia 02/24/2018  . Asthma, intermittent 02/24/2018  . Tobacco use disorder 02/24/2018   Past Medical History:  Diagnosis Date  . Alcoholism ([place])   . Anemia   . Anxiety   . Arthritis   . Asthma   . COPD (chronic obstructive  pulmonary disease) ([place])   . Depression   . Diabetes ([place])    type 2  . GERD (gastroesophageal reflux disease)   . History of hiatal hernia   . HTN (hypertension)   . Osteomyelitis ([place])    right forefoot  . Pancreatitis   . Pneumonia   . Wears glasses     Family History  Problem Relation Age of Onset  . Diabetes Mother   . Hypertension Mother   . Hyperlipidemia Mother   . Kidney disease Mother   . Thyroid disease Mother   . Breast cancer Mother        mets  . Lung cancer Mother   . Diabetes Father   . Alcohol abuse Sister   . Sickle cell trait Sister   . Diabetes Brother   . Asthma Brother   . Lung cancer Maternal Grandmother   . Kidney disease Maternal Grandmother   . Liver cancer Maternal Uncle        x 4-5  . Kidney disease Maternal Uncle   . Kidney disease Paternal Uncle     Past Surgical History:  Procedure Laterality Date  . 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 AMPUTATION Right 10/18/2019   Procedure: RIGHT SECOND TOE AMPUTATION;  Surgeon: Newt Minion, MD;  Location: [facility];  Service: Orthopedics;  Laterality: Right;  . AMPUTATION Right 11/08/2019   Procedure: RIGHT TRANSMETATARSAL AMPUTATION;  Surgeon: Newt Minion, MD;  Location: [facility];  Service: Orthopedics;  Laterality: Right;  . APPENDECTOMY    . CHOLECYSTECTOMY  2006   with gallbladder and spleen  . HERNIA REPAIR  2008, 2010   hiatal hernia and 1 additional  . PANCREATIC PSEUDOCYST DRAINAGE    . SPLENECTOMY  2006  . STUMP REVISION Right 11/24/2019   Procedure: REVISION RIGHT TRANSMETATARSAL AMPUTATION;  Surgeon: Newt Minion, MD;  Location: [facility];  Service: Orthopedics;  Laterality: Right;  . TOTAL KNEE ARTHROPLASTY Right 1982, 1984   Social History   Occupational History  . Not on file  Tobacco Use  . Smoking status: Former Smoker    Packs/day: 0.50    Quit date: 11/22/2019    Years since quitting: 0.0  . Smokeless tobacco: Never Used  Substance and Sexual Activity  .  Alcohol use: Not Currently    Comment: 2-3 per day, none since 2019  . Drug use: Not Currently  . Sexual activity: Not on file

## 2019-12-06 NOTE — Progress Notes (Deleted)
   Subjective:   Patient ID: Christopher Fernandez    DOB: 1971/01/29, 49 y.o. male   MRN: [id number]  TAQUON AMICUCCI is a 49 y.o. male with a history of intermittent asthma, GERD, exocrine pancreatic insufficiency, uncontrolled insulin dependent [id number] with complications including several toe amputations, tobacco use disorder, anxiety/depression, anemia, and alcohol use disorder  here for diabetes follow up.  Uncontrolled Diabetes: Last three A1C's below. Currently on Jardiance 25mg  QD, Metformin 1000mg  BID, Januvia 100mg  QD, Levimir 20U qHS, and Humalog sliding scale. Endorses compliance. Notes CBGs range ***. Denies any hypoglycemia. Denies any polyuria, polydipsia, polyphagia. Due for pneumonia vaccine, diabetic eye exam, urine microalbumin, and diabetic foot exam.  Lab Results  Component Value Date   HGBA1C 9.2 (A) 08/30/2019   HGBA1C 7.8 (A) 06/14/2018   HGBA1C 8.9 (H) 02/24/2018   HLD: Last lipid panel below. Not currently on cholesterol lowering medications. Denies any muscles aches or weakness.  Lab Results  Component Value Date   LDLDIRECT 55 02/24/2018   Anemia, Chronic: Baseline Hgb 11-12. Iron studies normal - Iron 90, Iron sat: 24%, TIBC 378, Ferritin 125. Due for colonoscopy. Denies any hematuria, hematochezia, melena, hematemesis.   Health Maintenance: Due for pneumonia vaccine, diabetic eye exam and foot exam, urine microalbumin, HIV, TDAP, colon cancer screening  Review of Systems:  Per HPI.   Objective:   There were no vitals taken for this visit. Vitals and nursing note reviewed.  General: well nourished, well developed, in no acute distress with non-toxic appearance HEENT: normocephalic, atraumatic, moist mucous membranes Neck: supple, non-tender without lymphadenopathy CV: regular rate and rhythm without murmurs, rubs, or gallops, no lower extremity edema Lungs: clear to auscultation bilaterally with normal work of breathing Abdomen: soft, non-tender, non-distended,  no masses or organomegaly palpable, normoactive bowel sounds Skin: warm, dry, no rashes or lesions Extremities: warm and well perfused, normal tone MSK: ROM grossly intact, strength intact, gait normal Neuro: Alert and oriented, speech normal  Assessment & Plan:   No problem-specific Assessment & Plan notes found for this encounter.  No orders of the defined types were placed in this encounter.  No orders of the defined types were placed in this encounter.  Start low dose ACE-I for kidney protection Start Statin - Crestor 20mg  QD If continues to be elevated at follow up, may consider switching long acting insulins or making Levemir BID dosing.  Referral to ophthalmologist for diabetic eye exam Diabetic foot exam performed   Mina Marble, DO PGY-2, [facility] 12/06/2019 2:39 PM

## 2019-12-08 ENCOUNTER — Ambulatory Visit: Payer: 59 | Admitting: Family Medicine

## 2019-12-12 ENCOUNTER — Encounter: Payer: Self-pay | Admitting: Family Medicine

## 2019-12-12 ENCOUNTER — Ambulatory Visit: Payer: 59 | Admitting: Family Medicine

## 2019-12-12 ENCOUNTER — Other Ambulatory Visit: Payer: Self-pay | Admitting: Family Medicine

## 2019-12-12 ENCOUNTER — Other Ambulatory Visit: Payer: Self-pay

## 2019-12-12 VITALS — BP 100/66 | HR 86 | Ht 75.0 in | Wt 262.5 lb

## 2019-12-12 DIAGNOSIS — Z13220 Encounter for screening for lipoid disorders: Secondary | ICD-10-CM | POA: Diagnosis not present

## 2019-12-12 DIAGNOSIS — Z794 Long term (current) use of insulin: Secondary | ICD-10-CM | POA: Diagnosis not present

## 2019-12-12 DIAGNOSIS — D649 Anemia, unspecified: Secondary | ICD-10-CM

## 2019-12-12 DIAGNOSIS — Q8901 Asplenia (congenital): Secondary | ICD-10-CM

## 2019-12-12 DIAGNOSIS — Z114 Encounter for screening for human immunodeficiency virus [HIV]: Secondary | ICD-10-CM | POA: Diagnosis not present

## 2019-12-12 DIAGNOSIS — Z23 Encounter for immunization: Secondary | ICD-10-CM

## 2019-12-12 DIAGNOSIS — Z1211 Encounter for screening for malignant neoplasm of colon: Secondary | ICD-10-CM

## 2019-12-12 DIAGNOSIS — E1165 Type 2 diabetes mellitus with hyperglycemia: Secondary | ICD-10-CM | POA: Diagnosis not present

## 2019-12-12 LAB — POCT GLYCOSYLATED HEMOGLOBIN (HGB A1C): HbA1c, POC (controlled diabetic range): 8.7 % — AB (ref 0.0–7.0)

## 2019-12-12 MED ORDER — ATORVASTATIN CALCIUM 40 MG PO TABS: 40.0000 mg | ORAL_TABLET | Freq: Every day | ORAL | 3 refills | Status: DC

## 2019-12-12 MED FILL — ATORVASTATIN 40 MG TABLET: 40 | 90 days supply | Qty: 90 | Fill #0

## 2019-12-12 NOTE — Patient Instructions (Signed)
Thank you for coming to see me today. It was a pleasure to see you.   It was so great to see you!  I am so glad that your foot is doing better.  I am so proud that you are diabetes is improving.  Please continue your current medications as they are.  Please call and schedule a virtual visit if you have persistently elevated blood sugars in the 200s.  Let us plan to return in 3 months for a diabetes follow-up.  I have started you on a new cholesterol medicine called Lipitor.  You will need to take this once a day.  Please be sure to schedule a diabetic eye exam with your eye doctor at your earliest convenience.  I have also referred you to a gastroenterologist for colonoscopy.  Please be sure to schedule this at your earliest convenience.  Today you received the pneumonia vaccine and the Tdap vaccine.  I also obtained some labs.  I will call you with the results if they are abnormal, otherwise I will send you a letter.  Please try to schedule your COVID-19 vaccine.  You can find this by going to the [facility] health website.  Scheduling is very easy and they have many available appointments.  Please follow-up with Dr. Tarry Kos in 3 months for diabetes follow up.  If you have any questions or concerns, please do not hesitate to call the office at [phone].  Take Care,   Dr. Mina Marble, DO Resident Physician [place] [phone]

## 2019-12-12 NOTE — Progress Notes (Signed)
Subjective:   Patient ID: Christopher Fernandez    DOB: 1971/02/01, 49 y.o. male   MRN: [id number]  Christopher Fernandez is a 49 y.o. male with a history of asthma, GERD, uncontrolled insulin-dependent type 2 diabetes, alcoholic/tobacco use disorder, anxiety/depression, anemia, history of lower extremity toe amputation here for diabetes follow-up.  Uncontrolled Diabetes: Last three A1C's below. Currently on Jardiance 25 mg daily, Januvia 100 mg daily, Metformin 1000 mg twice daily, Levemir 20 units every morning, Humalog sliding scale 3 times daily. Endorses compliance. Notes CBGs range 150-170, several readings in the 200's with max of 270. Denies any hypoglycemia. Denies any polyuria, polydipsia, polyphagia. Due for diabetic eye exam, urine microalbumin, diabetic foot exam, pneumonia vaccine.  Lab Results  Component Value Date   HGBA1C 8.7 (A) 12/12/2019   HGBA1C 9.2 (A) 08/30/2019   HGBA1C 7.8 (A) 06/14/2018   Anemia, Chronic: Baseline Hgb 11-12. Iron studies normal - Iron 90, Iron sat: 24%, TIBC 378, Ferritin 125. Due for colonoscopy. Denies any hematuria, hematochezia, melena, hematemesis. He notes he often has dark/black stools.   Health Maintenance: Due for pneumonia vaccine, diabetic eye exam and foot exam, urine microalbumin, HIV, TDAP, colon cancer screening  Review of Systems:  Per HPI.   Objective:   BP 100/66   Pulse 86   Ht 6\' 3"  (1.905 m)   Wt 262 lb 8 oz (119.1 kg)   SpO2 96%   BMI 32.81 kg/m  Vitals and nursing note reviewed.  General: Pleasant older male, sitting comfortably in exam chair with compression boot on right foot, well nourished, well developed, in no acute distress with non-toxic appearance CV: regular rate and rhythm without murmurs, rubs, or gallops, 2+ radial 2+ radial and pedal pulses bilaterally Lungs: clear to auscultation bilaterally with normal work of breathing Abdomen: soft, non-tender, normoactive bowel sounds Skin: warm, dry Extremities: warm and  well perfused Neuro: Alert and oriented, speech normal  Assessment & Plan:   Asplenia - Received PNA-23 and TDAP vaccine today. - Discussed importance of getting COVID-19 vaccine at earliest convenience.  - He is eligible for shingles vaccine. Will discuss at follow up visit.    Anemia History of anemia of unknown cause. Ambulatory referral to GI for colonoscopy.   Uncontrolled diabetes mellitus with hyperglycemia, with long-term current use of insulin (HCC) Uncontrolled, but improving.  A1c down to 8.7 from 9.2.  We will continue current medications at this time and follow-up in 3 months.  Patient was instructed to call if he has persistently elevated blood sugars in the 200s. -Diabetic foot exam deferred until follow-up visit due to foot wound/recent amputation. -Patient has an eye doctor.  He was instructed to make diabetic eye exam at earliest convenience. -Pneumonia vaccine given today -Patient started on statin therapy -Discussed starting low-dose ACE inhibitor for kidney protection.  Patient endorsed history of syncope when taking low-dose ACE inhibitor.  Will defer starting at this time and monitor kidney functions closely.  Currently on SGLT2 inhibitor. - Microalbumin creatinine ratio pending. -Per pharmacy patient's Levemir is no longer covered.  Transition to Basaglar 20 units every morning.  Discussed with Dr. Valentina Lucks who agreed with dosage. - If continues to be elevated at follow up, may consider switching to long acting insulins or making Basaglar to BID dosing. - Follow-up in 3 months for repeat A1c, sooner if worsening blood sugars  Health Maintenance: TDAP and PNA given today Microalbumin:Cr pending. Will follow up Referred to GI for colonoscopy Plan for diabetic foot  exam at follow-up visit Patient to call and schedule diabetic eye exam HIV negative Recommended patient schedule to get COVID-19 vaccine We will discuss meningococcal and shingles vaccine at follow-up  visit  Orders Placed This Encounter  Procedures  . Tdap vaccine greater than or equal to 7yo IM  . Pneumococcal polysaccharide vaccine 23-valent greater than or equal to 2yo subcutaneous/IM  . Lipid Panel  . Microalbumin/Creatinine Ratio, Urine  . HIV antibody (with reflex)  . Ambulatory referral to Gastroenterology    Referral Priority:   Routine    Referral Type:   Consultation    Referral Reason:   Specialty Services Required    Number of Visits Requested:   1  . POCT glycosylated hemoglobin (Hb A1C)   Meds ordered this encounter  Medications  . atorvastatin (LIPITOR) 40 MG tablet    Sig: Take 1 tablet (40 mg total) by mouth daily.    Dispense:  90 tablet    Refill:  [place], DO PGY-2, [facility] 12/13/2019 4:00 PM

## 2019-12-13 ENCOUNTER — Other Ambulatory Visit: Payer: Self-pay | Admitting: Family Medicine

## 2019-12-13 ENCOUNTER — Encounter: Payer: Self-pay | Admitting: Family Medicine

## 2019-12-13 ENCOUNTER — Telehealth: Payer: Self-pay

## 2019-12-13 DIAGNOSIS — Q8901 Asplenia (congenital): Secondary | ICD-10-CM | POA: Insufficient documentation

## 2019-12-13 DIAGNOSIS — Z794 Long term (current) use of insulin: Secondary | ICD-10-CM

## 2019-12-13 DIAGNOSIS — E1165 Type 2 diabetes mellitus with hyperglycemia: Secondary | ICD-10-CM

## 2019-12-13 LAB — LIPID PANEL
Chol/HDL Ratio: 3.5 ratio (ref 0.0–5.0)
Cholesterol, Total: 136 mg/dL (ref 100–199)
HDL: 39 mg/dL — ABNORMAL LOW (ref >39)
LDL Chol Calc (NIH): 71 mg/dL (ref 0–99)
Triglycerides: 149 mg/dL (ref 0–149)
VLDL Cholesterol Cal: 26 mg/dL (ref 5–40)

## 2019-12-13 LAB — HIV ANTIBODY (ROUTINE TESTING W REFLEX): HIV Screen 4th Generation wRfx: NONREACTIVE

## 2019-12-13 MED ORDER — BASAGLAR KWIKPEN 100 UNIT/ML ~~LOC~~ SOPN: 20.0000 [IU] | PEN_INJECTOR | Freq: Every morning | SUBCUTANEOUS | 2 refills | Status: DC

## 2019-12-13 MED FILL — MIRTAZAPINE 30 MG TABLET: 30 | 90 days supply | Qty: 90 | Fill #0

## 2019-12-13 MED FILL — GABAPENTIN 300 MG CAPSULE: 300 | 30 days supply | Qty: 90 | Fill #0

## 2019-12-13 MED FILL — BASAGLAR 100 UNIT/ML KWIKPE: 100 | 30 days supply | Qty: 6 | Fill #0

## 2019-12-13 MED FILL — JANUVIA 100 MG TABLET: 100 | 30 days supply | Qty: 30 | Fill #0

## 2019-12-13 NOTE — Telephone Encounter (Signed)
Christopher Fernandez with pharmacy calls nurse line regarding medication questions.   Patient's insurance is no longer covering the levemir for long acting insulin. Patient's insurance prefers [employer]. Please advise if change is appropriate.  Patient also requests that gabapentin be refilled as 1 capsule 3 times/ daily, instead of twice daily.   To PCP  Please advise  Talbot Grumbling, RN

## 2019-12-13 NOTE — Telephone Encounter (Signed)
Yes, patient should take in the morning.  Electronic order placed per pharmacy request.

## 2019-12-13 NOTE — Telephone Encounter (Signed)
Change is appropriate. He can transition to [employer] 20 units qAM.  I attempted to send in new rx through epic but it still says that Levemir is preferred.  I have contacted patient and made him aware of change. Please let me know if there is anything I need to do to make this adjustment for patient.   I have refilled Gabapentin per patient request and corrected dosage/frequency.

## 2019-12-13 NOTE — Telephone Encounter (Signed)
Spoke with pharmacy to give verbal okay to change from Levemir to [employer]. Pharmacy wants to confirm if patient should take basaglar in the AM as he was taking Levemir nightly.   Pharmacist asking if we can override the preferred option and send in electronically.   Please advise  To PCP  Talbot Grumbling, RN

## 2019-12-13 NOTE — Assessment & Plan Note (Addendum)
-   Received PNA-23 and TDAP vaccine today. - Discussed importance of getting COVID-19 vaccine at earliest convenience.  - He is eligible for shingles vaccine. Will discuss at follow up visit.

## 2019-12-13 NOTE — Assessment & Plan Note (Addendum)
Uncontrolled, but improving.  A1c down to 8.7 from 9.2.  We will continue current medications at this time and follow-up in 3 months.  Patient was instructed to call if he has persistently elevated blood sugars in the 200s. -Diabetic foot exam deferred until follow-up visit due to foot wound/recent amputation. -Patient has an eye doctor.  He was instructed to make diabetic eye exam at earliest convenience. -Pneumonia vaccine given today -Patient started on statin therapy -Discussed starting low-dose ACE inhibitor for kidney protection.  Patient endorsed history of syncope when taking low-dose ACE inhibitor.  Will defer starting at this time and monitor kidney functions closely.  Currently on SGLT2 inhibitor. - Microalbumin creatinine ratio pending. -Per pharmacy patient's Levemir is no longer covered.  Transition to Basaglar 20 units every morning.  Discussed with Dr. Valentina Lucks who agreed with dosage. - If continues to be elevated at follow up, may consider switching to long acting insulins or making Basaglar to BID dosing. - Follow-up in 3 months for repeat A1c, sooner if worsening blood sugars

## 2019-12-13 NOTE — Assessment & Plan Note (Signed)
History of anemia of unknown cause. Ambulatory referral to GI for colonoscopy.

## 2019-12-14 ENCOUNTER — Encounter: Payer: Self-pay | Admitting: Internal Medicine

## 2019-12-18 ENCOUNTER — Encounter: Payer: Self-pay | Admitting: Family Medicine

## 2019-12-18 NOTE — Telephone Encounter (Signed)
Spoke to patient.  He notes that his foot is getting more swollen and now there is a "hole where the stitches have come apart".  He is worried it is getting worse.  He is concerned about the care he is receiving.  I reassured patient and highly recommended that he call to schedule with Ortho care ASAP to be evaluated for worsening foot wound.  If he desires to see a new physician he may request at that time.  He voiced understanding and agreement with plan.  Recommend that if he is unable to be seen today or tomorrow that he come to our clinic or to the emergency department for further evaluation.

## 2019-12-19 ENCOUNTER — Encounter: Payer: Self-pay | Admitting: Family

## 2019-12-19 ENCOUNTER — Other Ambulatory Visit: Payer: Self-pay

## 2019-12-19 ENCOUNTER — Ambulatory Visit (INDEPENDENT_AMBULATORY_CARE_PROVIDER_SITE_OTHER): Payer: 59 | Admitting: Family

## 2019-12-19 VITALS — Ht 75.0 in | Wt 262.0 lb

## 2019-12-19 DIAGNOSIS — Z794 Long term (current) use of insulin: Secondary | ICD-10-CM

## 2019-12-19 DIAGNOSIS — E1165 Type 2 diabetes mellitus with hyperglycemia: Secondary | ICD-10-CM

## 2019-12-19 DIAGNOSIS — Z89431 Acquired absence of right foot: Secondary | ICD-10-CM

## 2019-12-19 DIAGNOSIS — T8781 Dehiscence of amputation stump: Secondary | ICD-10-CM | POA: Diagnosis not present

## 2019-12-19 LAB — MICROALBUMIN / CREATININE URINE RATIO
Creatinine, Urine: 109.1 mg/dL
Microalb/Creat Ratio: 4 mg/g creat (ref 0–29)
Microalbumin, Urine: 4.9 ug/mL

## 2019-12-19 MED ORDER — SULFAMETHOXAZOLE-TRIMETHOPRIM 800-160 MG PO TABS: 1.0000 | ORAL_TABLET | Freq: Two times a day (BID) | ORAL | 0 refills | Status: DC

## 2019-12-19 MED FILL — SULFAMETHOXAZOLE-TMP DS TAB: 800-160 | 30 days supply | Qty: 60 | Fill #0

## 2019-12-19 NOTE — Progress Notes (Signed)
Post-Op Visit Note   Patient: Christopher Fernandez           Date of Birth: 07-28-1971           MRN: [id number] Visit Date: 12/19/2019 PCP: Danna Hefty, DO  Chief Complaint:  Chief Complaint  Patient presents with  . Right Foot - Routine Post Op    11/24/19 revision right transmet amputation     HPI:  HPI Is a 49 year old gentleman who presents today in follow-up for revision right transmetatarsal amputation.  The surgery was on March 12 of this year.  He has been weightbearing with crutches as well as a Darco shoe.  States that he has been feeling okay last week he did complete his Bactrim course on Saturday and over the weekend states following stopping his Bactrim he had an increase in swelling and pain to his incision as well as some progressive wound dehiscence.  He is complaining of some yellow drainage of foul odor concerned that this is not healing well.  At this point he is very interested in getting a second opinion.  States he is planning to take his records elsewhere for second opinion is interested in IV antibiotics as well is quite interested in having infectious disease give their input.   Ortho Exam On examination of the right transmetatarsal amputation unfortunately this has some dehiscence medially and sutures are still in place and incision is healing well laterally.  There is some dried yellow drainage there is some proud granulation in the wound bed this does probe 1 cm deep.  I cannot probe to bone.  There is no active drainage today he does have a foul odor.  Dorsalis pedis pulse biphasic with a Doppler.  Visit Diagnoses:  1. Dehiscence of amputation stump ([place])   2. History of transmetatarsal amputation of right foot ([place])   3. Uncontrolled diabetes mellitus with hyperglycemia, with long-term current use of insulin ([place])     Plan: Have provided prescription for Bactrim as well as a referral to infectious diseases.  Wound culture sent.  Encouraged the patient  to follow-up in 1 week also encouraged the patient to call or return for any concerning sign discussed return precautions.  Questions were encouraged and answered.  Follow-Up Instructions: Return in about 1 week (around 12/26/2019).   Imaging: No results found.  Orders:  No orders of the defined types were placed in this encounter.  No orders of the defined types were placed in this encounter.    PMFS History: Patient Active Problem List   Diagnosis Date Noted  . Asplenia 12/13/2019  . Scrotum swelling 06/13/2019  . Exocrine pancreatic insufficiency 06/14/2018  . Uncontrolled diabetes mellitus with hyperglycemia, with long-term current use of insulin ([place]) 02/24/2018  . Alcohol use disorder, severe, dependence ([place]) 02/24/2018  . Anxiety associated with depression 02/24/2018  . GERD (gastroesophageal reflux disease) 02/24/2018  . Anemia 02/24/2018  . Asthma, intermittent 02/24/2018  . Tobacco use disorder 02/24/2018   Past Medical History:  Diagnosis Date  . Alcoholism ([place])   . Anemia   . Anxiety   . Arthritis   . Asthma   . COPD (chronic obstructive pulmonary disease) ([place])   . Depression   . Diabetes ([place])    type 2  . GERD (gastroesophageal reflux disease)   . History of hiatal hernia   . HTN (hypertension)   . 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 Pancreatitis   . Pneumonia   .  Wears glasses     Family History  Problem Relation Age of Onset  . Diabetes Mother   . Hypertension Mother   . Hyperlipidemia Mother   . Kidney disease Mother   . Thyroid disease Mother   . Breast cancer Mother        mets  . Lung cancer Mother   . Diabetes Father   . Alcohol abuse Sister   . Sickle cell trait Sister   . Diabetes Brother   . Asthma Brother   . Lung cancer Maternal Grandmother   . Kidney disease Maternal Grandmother   . Liver cancer Maternal Uncle        x 4-5  . Kidney disease Maternal Uncle   . Kidney disease Paternal Uncle     Past Surgical  History:  Procedure Laterality Date  . 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 AMPUTATION Right 10/18/2019   Procedure: RIGHT SECOND TOE AMPUTATION;  Surgeon: Newt Minion, MD;  Location: [facility];  Service: Orthopedics;  Laterality: Right;  . AMPUTATION Right 11/08/2019   Procedure: RIGHT TRANSMETATARSAL AMPUTATION;  Surgeon: Newt Minion, MD;  Location: [facility];  Service: Orthopedics;  Laterality: Right;  . APPENDECTOMY    . CHOLECYSTECTOMY  2006   with gallbladder and spleen  . HERNIA REPAIR  2008, 2010   hiatal hernia and 1 additional  . PANCREATIC PSEUDOCYST DRAINAGE    . SPLENECTOMY  2006  . STUMP REVISION Right 11/24/2019   Procedure: REVISION RIGHT TRANSMETATARSAL AMPUTATION;  Surgeon: Newt Minion, MD;  Location: [facility];  Service: Orthopedics;  Laterality: Right;  . TOTAL KNEE ARTHROPLASTY Right 1982, 1984   Social History   Occupational History  . Not on file  Tobacco Use  . Smoking status: Former Smoker    Packs/day: 0.50    Quit date: 11/22/2019    Years since quitting: 0.0  . Smokeless tobacco: Never Used  Substance and Sexual Activity  . Alcohol use: Not Currently    Comment: 2-3 per day, none since 2019  . Drug use: Not Currently  . Sexual activity: Not on file

## 2019-12-22 ENCOUNTER — Ambulatory Visit: Payer: 59 | Admitting: Physician Assistant

## 2019-12-23 LAB — WOUND CULTURE
MICRO NUMBER:: 10332175
SPECIMEN QUALITY:: ADEQUATE

## 2019-12-25 ENCOUNTER — Ambulatory Visit: Payer: 59 | Admitting: Physician Assistant

## 2019-12-25 DIAGNOSIS — Z760 Encounter for issue of repeat prescription: Secondary | ICD-10-CM | POA: Diagnosis not present

## 2019-12-27 ENCOUNTER — Ambulatory Visit: Payer: 59 | Admitting: Physician Assistant

## 2019-12-27 MED FILL — PENTOXIFYLLINE 400 MG TAB S: 400 | 30 days supply | Qty: 90 | Fill #1

## 2019-12-28 ENCOUNTER — Encounter: Payer: Self-pay | Admitting: Orthopedic Surgery

## 2019-12-28 ENCOUNTER — Other Ambulatory Visit: Payer: Self-pay

## 2019-12-28 ENCOUNTER — Ambulatory Visit (INDEPENDENT_AMBULATORY_CARE_PROVIDER_SITE_OTHER): Payer: 59 | Admitting: Orthopedic Surgery

## 2019-12-28 VITALS — Ht 75.0 in | Wt 262.0 lb

## 2019-12-28 DIAGNOSIS — T8781 Dehiscence of amputation stump: Secondary | ICD-10-CM

## 2019-12-28 DIAGNOSIS — Z89431 Acquired absence of right foot: Secondary | ICD-10-CM

## 2019-12-28 MED ORDER — NITROGLYCERIN 0.2 MG/HR TD PT24: 0.2000 mg | MEDICATED_PATCH | Freq: Every day | TRANSDERMAL | 12 refills | Status: DC

## 2019-12-28 MED ORDER — SILVER SULFADIAZINE 1 % EX CREA: 1.0000 "application " | TOPICAL_CREAM | Freq: Every day | CUTANEOUS | 3 refills | Status: DC

## 2019-12-28 MED FILL — NITROGLYCERIN 0.2 MG/HR PTC: 0.2 | 30 days supply | Qty: 30 | Fill #0

## 2019-12-28 MED FILL — SSD 1% CREAM: 1 | 30 days supply | Qty: 400 | Fill #0

## 2019-12-28 NOTE — Progress Notes (Signed)
Office Visit Note   Patient: Christopher Fernandez           Date of Birth: Sep 24, 1970           MRN: [id number] Visit Date: 12/28/2019              Requested by: Danna Hefty, DO [address],  [place] [address] PCP: Danna Hefty, DO  Chief Complaint  Patient presents with  . Right Foot - Routine Post Op    11/24/19 revision right transmet amputation       HPI: [name] who is status post revision right transmetatarsal amputation he is about 4 weeks out he states the wound has opened up he is weightbearing with a postoperative shoe and crutches.  Cultures were sensitive to Bactrim DS and is currently on Bactrim DS.  He states he is also taking the Trental patient has used nitroglycerin patches before.  Assessment & Plan: Visit Diagnoses:  1. Dehiscence of amputation stump ([place])   2. History of transmetatarsal amputation of right foot ([place])     Plan: Patient wants to continue with limb salvage intervention we will call in a prescription for nitroglycerin patches to apply 1 patch over the dorsum of his foot change daily prescription for Silvadene for Dial soap cleansing and Silvadene dry dressing changes daily continue the Bactrim DS continue with the Trental reevaluate in 1 week.  Follow-Up Instructions: Return in about 1 week (around 01/04/2020).   Ortho Exam  Patient is alert, oriented, no adenopathy, well-dressed, normal affect, normal respiratory effort. Examination patient has a strong dorsalis pedis and posterior tibial pulse the Doppler was used and he has a strong biphasic dorsalis pedis and posterior tibial pulse.  Patient has had progressive wound dehiscence with the wound 4 x 6 cm and 1 cm deep there is granulation tissue at the base with no exposed bone no ascending cellulitis.  Imaging: No results found. No images are attached to the encounter.  Labs: Lab Results  Component Value Date   HGBA1C 8.7 (A) 12/12/2019   HGBA1C 9.2 (A) 08/30/2019   HGBA1C 7.8 (A) 06/14/2018   REPTSTATUS 11/29/2019 FINAL 11/24/2019   GRAMSTAIN  11/24/2019    RARE WBC PRESENT, PREDOMINANTLY PMN FEW GRAM POSITIVE COCCI IN PAIRS IN CLUSTERS RARE GRAM POSITIVE RODS    CULT  11/24/2019    ABUNDANT METHICILLIN RESISTANT STAPHYLOCOCCUS AUREUS FEW SERRATIA MARCESCENS NO ANAEROBES ISOLATED Performed at [facility], [place] [address] [place], [place] [address]    LABORGA SERRATIA MARCESCENS 11/24/2019   LABORGA METHICILLIN RESISTANT STAPHYLOCOCCUS AUREUS 11/24/2019     Lab Results  Component Value Date   ALBUMIN 3.7 10/31/2019   ALBUMIN 3.8 10/18/2019   ALBUMIN 4.3 02/24/2018    No results found for: MG No results found for: VD25OH  No results found for: PREALBUMIN CBC EXTENDED Latest Ref Rng & Units 11/24/2019 10/31/2019 10/18/2019  WBC 4.0 - 10.5 K/uL 8.0 8.8 5.4  RBC 4.22 - 5.81 MIL/uL 4.10(L) 4.57 4.65  HGB 13.0 - 17.0 g/dL 11.5(L) 13.2 13.5  HCT 39.0 - 52.0 % 36.3(L) 40.9 41.5  PLT 150 - 400 K/uL 489(H) 327 319  NEUTROABS 1.7 - 7.7 K/uL - 6.2 -  LYMPHSABS 0.7 - 4.0 K/uL - 1.6 -     Body mass index is 32.75 kg/m.  Orders:  No orders of the defined types were placed in this encounter.  Meds ordered this encounter  Medications  .  silver sulfADIAZINE (SILVADENE) 1 % cream    Sig: Apply 1 application topically daily. Apply to affected area daily plus dry dressing    Dispense:  400 g    Refill:  3  . nitroGLYCERIN (NITRODUR - DOSED IN MG/24 HR) 0.2 mg/hr patch    Sig: Place 1 patch (0.2 mg total) onto the skin daily.    Dispense:  30 patch    Refill:  12     Procedures: No procedures performed  Clinical Data: No additional findings.  ROS:  All other systems negative, except as noted in the HPI. Review of Systems  Objective: Vital Signs: Ht 6\' 3"  (1.905 m)   Wt 262 lb (118.8 kg)   BMI 32.75 kg/m   Specialty Comments:  No specialty comments available.  PMFS History: Patient Active  Problem List   Diagnosis Date Noted  . Asplenia 12/13/2019  . Scrotum swelling 06/13/2019  . Exocrine pancreatic insufficiency 06/14/2018  . Uncontrolled diabetes mellitus with hyperglycemia, with long-term current use of insulin ([place]) 02/24/2018  . Alcohol use disorder, severe, dependence ([place]) 02/24/2018  . Anxiety associated with depression 02/24/2018  . GERD (gastroesophageal reflux disease) 02/24/2018  . Anemia 02/24/2018  . Asthma, intermittent 02/24/2018  . Tobacco use disorder 02/24/2018   Past Medical History:  Diagnosis Date  . Alcoholism ([place])   . Anemia   . Anxiety   . Arthritis   . Asthma   . COPD (chronic obstructive pulmonary disease) ([place])   . Depression   . Diabetes ([place])    type 2  . GERD (gastroesophageal reflux disease)   . History of hiatal hernia   . HTN (hypertension)   . Osteomyelitis ([place])    right forefoot  . Pancreatitis   . Pneumonia   . Wears glasses     Family History  Problem Relation Age of Onset  . Diabetes Mother   . Hypertension Mother   . Hyperlipidemia Mother   . Kidney disease Mother   . Thyroid disease Mother   . Breast cancer Mother        mets  . Lung cancer Mother   . Diabetes Father   . Alcohol abuse Sister   . Sickle cell trait Sister   . Diabetes Brother   . Asthma Brother   . Lung cancer Maternal Grandmother   . Kidney disease Maternal Grandmother   . Liver cancer Maternal Uncle        x 4-5  . Kidney disease Maternal Uncle   . Kidney disease Paternal Uncle     Past Surgical History:  Procedure Laterality Date  . 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 AMPUTATION Right 10/18/2019   Procedure: RIGHT SECOND TOE AMPUTATION;  Surgeon: Newt Minion, MD;  Location: [facility];  Service: Orthopedics;  Laterality: Right;  . AMPUTATION Right 11/08/2019   Procedure: RIGHT TRANSMETATARSAL AMPUTATION;  Surgeon: Newt Minion, MD;  Location: [facility];  Service: Orthopedics;  Laterality: Right;  . APPENDECTOMY    .  CHOLECYSTECTOMY  2006   with gallbladder and spleen  . HERNIA REPAIR  2008, 2010   hiatal hernia and 1 additional  . PANCREATIC PSEUDOCYST DRAINAGE    . SPLENECTOMY  2006  . STUMP REVISION Right 11/24/2019   Procedure: REVISION RIGHT TRANSMETATARSAL AMPUTATION;  Surgeon: Newt Minion, MD;  Location: [facility];  Service: Orthopedics;  Laterality: Right;  . TOTAL KNEE ARTHROPLASTY Right 1982, 1984   Social History   Occupational History  .  Not on file  Tobacco Use  . Smoking status: Former Smoker    Packs/day: 0.50    Quit date: 11/22/2019    Years since quitting: 0.0  . Smokeless tobacco: Never Used  Substance and Sexual Activity  . Alcohol use: Not Currently    Comment: 2-3 per day, none since 2019  . Drug use: Not Currently  . Sexual activity: Not on file

## 2020-01-03 ENCOUNTER — Other Ambulatory Visit: Payer: Self-pay | Admitting: *Deleted

## 2020-01-03 DIAGNOSIS — I739 Peripheral vascular disease, unspecified: Secondary | ICD-10-CM

## 2020-01-04 ENCOUNTER — Ambulatory Visit: Payer: 59 | Admitting: Orthopedic Surgery

## 2020-01-05 ENCOUNTER — Ambulatory Visit (AMBULATORY_SURGERY_CENTER): Payer: 59 | Admitting: *Deleted

## 2020-01-05 ENCOUNTER — Other Ambulatory Visit: Payer: Self-pay

## 2020-01-05 ENCOUNTER — Telehealth: Payer: Self-pay | Admitting: *Deleted

## 2020-01-05 VITALS — Temp 97.5°F | Ht 75.0 in | Wt 258.0 lb

## 2020-01-05 DIAGNOSIS — Z1211 Encounter for screening for malignant neoplasm of colon: Secondary | ICD-10-CM

## 2020-01-05 DIAGNOSIS — Z01818 Encounter for other preprocedural examination: Secondary | ICD-10-CM

## 2020-01-05 NOTE — Telephone Encounter (Signed)
Dr.Danis,  This patient had PV today for a direct screening colonoscopy. He was referred to [place] by his PCP Dr.Mullis. Patient states his last colonoscopy was about 8-10 years ago in [place] (normal exam). He does state he is having "loose stools". He had an office visit with you on 05/26/2018. 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   Ok for direct screening colonoscopy at this time? Thank you, Robbin pv

## 2020-01-05 NOTE — Progress Notes (Signed)
Patient is here in-person for PV. Patient denies any allergies to eggs or soy. Patient denies any problems with anesthesia/sedation. Patient denies any oxygen use at home. Patient denies taking any diet/weight loss medications or blood thinners. 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  Patient is aware of our care-partner policy and 0000000 safety protocol.

## 2020-01-08 NOTE — Telephone Encounter (Signed)
Difficult to say - might not be optimal time for a screening colonoscopy. If this patient feels he may be ready to discuss colon cancer screening (colonoscopy vs cologuard test) then he needs a clinic visit to review medical conditions and diabetic treatment to have that discussion.  If not, then please ask him to contact us when he feels ready.

## 2020-01-09 ENCOUNTER — Ambulatory Visit (INDEPENDENT_AMBULATORY_CARE_PROVIDER_SITE_OTHER): Payer: 59 | Admitting: Vascular Surgery

## 2020-01-09 ENCOUNTER — Encounter: Payer: Self-pay | Admitting: Vascular Surgery

## 2020-01-09 ENCOUNTER — Other Ambulatory Visit (HOSPITAL_COMMUNITY)
Admission: RE | Admit: 2020-01-09 | Discharge: 2020-01-09 | Disposition: A | Discharge status: Home / Self Care | Payer: 59 | Source: Ambulatory Visit | Attending: Vascular Surgery | Admitting: Vascular Surgery

## 2020-01-09 ENCOUNTER — Encounter (HOSPITAL_COMMUNITY): Payer: Self-pay | Admitting: Vascular Surgery

## 2020-01-09 ENCOUNTER — Ambulatory Visit (INDEPENDENT_AMBULATORY_CARE_PROVIDER_SITE_OTHER)
Admission: RE | Admit: 2020-01-09 | Discharge: 2020-01-09 | Disposition: A | Discharge status: Home / Self Care | Payer: 59 | Source: Ambulatory Visit | Attending: Vascular Surgery | Admitting: Vascular Surgery

## 2020-01-09 ENCOUNTER — Other Ambulatory Visit: Payer: Self-pay

## 2020-01-09 DIAGNOSIS — E1151 Type 2 diabetes mellitus with diabetic peripheral angiopathy without gangrene: Secondary | ICD-10-CM | POA: Diagnosis not present

## 2020-01-09 DIAGNOSIS — M869 Osteomyelitis, unspecified: Secondary | ICD-10-CM | POA: Diagnosis not present

## 2020-01-09 DIAGNOSIS — I10 Essential (primary) hypertension: Secondary | ICD-10-CM | POA: Diagnosis not present

## 2020-01-09 DIAGNOSIS — I739 Peripheral vascular disease, unspecified: Secondary | ICD-10-CM | POA: Insufficient documentation

## 2020-01-09 DIAGNOSIS — Z89431 Acquired absence of right foot: Secondary | ICD-10-CM | POA: Diagnosis not present

## 2020-01-09 DIAGNOSIS — E785 Hyperlipidemia, unspecified: Secondary | ICD-10-CM | POA: Diagnosis not present

## 2020-01-09 DIAGNOSIS — T8743 Infection of amputation stump, right lower extremity: Secondary | ICD-10-CM | POA: Diagnosis not present

## 2020-01-09 DIAGNOSIS — Z20822 Contact with and (suspected) exposure to covid-19: Secondary | ICD-10-CM | POA: Diagnosis not present

## 2020-01-09 DIAGNOSIS — K449 Diaphragmatic hernia without obstruction or gangrene: Secondary | ICD-10-CM | POA: Diagnosis not present

## 2020-01-09 DIAGNOSIS — E1169 Type 2 diabetes mellitus with other specified complication: Secondary | ICD-10-CM | POA: Diagnosis not present

## 2020-01-09 DIAGNOSIS — J449 Chronic obstructive pulmonary disease, unspecified: Secondary | ICD-10-CM | POA: Diagnosis not present

## 2020-01-09 LAB — SARS CORONAVIRUS 2 (TAT 6-24 HRS): SARS Coronavirus 2: NEGATIVE

## 2020-01-09 NOTE — Progress Notes (Signed)
Patient name: Christopher Fernandez MRN: [id number] DOB: June 09, 1971 Sex: male  REASON FOR CONSULT: Second opinion regarding nonhealing right TMA  HPI: Christopher Fernandez is a 49 y.o. male, with history of COPD, diabetes, hypertension that presents for second opinion regarding nonhealing right TMA.  Patient's previously has been under the care of Dr. Sharol Given and had a right second toe amp on 10/18/2019 for osteomyelitis.  Ultimately this failed to heal and then on 11/07/2018 when he had a right TMA with a VAC and then this failed to heal and he subsequently had a revision on 11/24/2019.  He states that the wound smells bad and Dr. Sharol Given has told him he was likely going to need a transtibial amputation.  Wound cultures have grown MRSA in review of records.    Past Medical History:  Diagnosis Date  . Alcoholism ([place])   . Anemia   . Anxiety   . Arthritis   . Asthma   . COPD (chronic obstructive pulmonary disease) ([place])   . Depression   . Diabetes ([place])    type 2  . GERD (gastroesophageal reflux disease)   . History of hiatal hernia   . HTN (hypertension)   . Osteomyelitis ([place])    right forefoot  . Pancreatitis   . Pneumonia   . Wears glasses     Past Surgical History:  Procedure Laterality Date  . 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 AMPUTATION Right 10/18/2019   Procedure: RIGHT SECOND TOE AMPUTATION;  Surgeon: Newt Minion, MD;  Location: [facility];  Service: Orthopedics;  Laterality: Right;  . AMPUTATION Right 11/08/2019   Procedure: RIGHT TRANSMETATARSAL AMPUTATION;  Surgeon: Newt Minion, MD;  Location: [facility];  Service: Orthopedics;  Laterality: Right;  . APPENDECTOMY    . CHOLECYSTECTOMY  2006   with gallbladder and spleen  . COLONOSCOPY  8-10 years ago    in [place] exam per pt  . HERNIA REPAIR  2008, 2010   hiatal hernia and 1 additional  . PANCREATIC PSEUDOCYST DRAINAGE    . SPLENECTOMY  2006  . STUMP REVISION Right 11/24/2019   Procedure: REVISION RIGHT  TRANSMETATARSAL AMPUTATION;  Surgeon: Newt Minion, MD;  Location: [facility];  Service: Orthopedics;  Laterality: Right;  . TOTAL KNEE ARTHROPLASTY Right 1982, 1984    Family History  Problem Relation Age of Onset  . Diabetes Mother   . Hypertension Mother   . Hyperlipidemia Mother   . Kidney disease Mother   . Thyroid disease Mother   . Breast cancer Mother        mets  . Lung cancer Mother   . Diabetes Father   . Alcohol abuse Sister   . Sickle cell trait Sister   . Diabetes Brother   . Asthma Brother   . Lung cancer Maternal Grandmother   . Kidney disease Maternal Grandmother   . Liver cancer Maternal Uncle        x 4-5  . Kidney disease Maternal Uncle   . Kidney disease Paternal Uncle   . Colon cancer Neg Hx   . Colon polyps Neg Hx   . Esophageal cancer Neg Hx   . Rectal cancer Neg Hx   . Stomach cancer Neg Hx     SOCIAL HISTORY: Social History   Socioeconomic History  . Marital status: Married    Spouse name: Not on file  . Number of children: 1  . Years of education: Not on  file  . Highest education level: Not on file  Occupational History  . Not on file  Tobacco Use  . Smoking status: Current Every Day Smoker    Packs/day: 0.50    Types: Cigarettes    Last attempt to quit: 11/22/2019    Years since quitting: 0.1  . Smokeless tobacco: Never Used  Substance and Sexual Activity  . Alcohol use: Not Currently    Comment: 2-3 per day, none since 2019  . Drug use: Not Currently  . Sexual activity: Not on file  Other Topics Concern  . Not on file  Social History Narrative  . Not on file   Social Determinants of Health   Financial Resource Strain:   . Difficulty of Paying Living Expenses:   Food Insecurity:   . Worried About Charity fundraiser in the Last Year:   . Arboriculturist in the Last Year:   Transportation Needs:   . Film/video editor (Medical):   [name] Lack of Transportation (Non-Medical):   Physical Activity:   . Days of Exercise per  Week:   . Minutes of Exercise per Session:   Stress:   . Feeling of Stress :   Social Connections:   . Frequency of Communication with Friends and Family:   . Frequency of Social Gatherings with Friends and Family:   . Attends Religious Services:   . Active Member of Clubs or Organizations:   . Attends Archivist Meetings:   [name] Marital Status:   Intimate Partner Violence:   . Fear of Current or Ex-Partner:   . Emotionally Abused:   [name] Physically Abused:   . Sexually Abused:     Allergies  Allergen Reactions  . Eggs Or Egg-Derived Products Rash  . Morphine And Related Other (See Comments)    Cant take because of pancreatitis  . Cocoa Rash    Current Outpatient Medications  Medication Sig Dispense Refill  . albuterol (VENTOLIN HFA) 108 (90 Base) MCG/ACT inhaler Inhale 2-4 puffs into the lungs every 4 (four) hours as needed for wheezing (or cough). (Patient taking differently: Inhale 2 puffs into the lungs every 4 (four) hours as needed for wheezing (or cough). ) 6.7 g 11  . ARIPiprazole (ABILIFY) 5 MG tablet Take 1 tablet (5 mg total) by mouth daily. 90 tablet 2  . atorvastatin (LIPITOR) 40 MG tablet Take 1 tablet (40 mg total) by mouth daily. 90 tablet 3  . bismuth subsalicylate (PEPTO BISMOL) 262 MG/15ML suspension Take 30 mLs by mouth every 6 (six) hours as needed for indigestion or diarrhea or loose stools.    . diphenhydrAMINE (BENADRYL) 25 MG tablet Take 25 mg by mouth every 6 (six) hours as needed for itching or allergies.     . DULoxetine (CYMBALTA) 60 MG capsule Take 1 capsule (60 mg total) by mouth daily. 90 capsule 2  . EASY TOUCH PEN NEEDLES 31G X 5 MM MISC Inject 1 application as directed 3 (three) times daily. 100 each 11  . empagliflozin (JARDIANCE) 25 MG TABS tablet Take 25 mg by mouth daily. 90 tablet 3  . fluticasone (FLONASE) 50 MCG/ACT nasal spray Place 1 spray into both nostrils daily as needed for allergies or rhinitis.    . Fluticasone-Salmeterol  (ADVAIR DISKUS) 250-50 MCG/DOSE AEPB Inhale 1 puff into the lungs 2 (two) times daily. 180 each 3  . gabapentin (NEURONTIN) 300 MG capsule Take 1 capsule (300 mg total) by mouth 3 (three) times daily. 90 capsule  2  . glucose blood (FREESTYLE LITE) test strip Use as instructed 100 each 12  . ibuprofen (ADVIL) 200 MG tablet Take 800 mg by mouth every 8 (eight) hours as needed for headache or moderate pain.     . Insulin Detemir (LEVEMIR) 100 UNIT/ML Pen Inject 20 Units into the skin daily at 10 pm. 15 mL 11  . Insulin Glargine (BASAGLAR KWIKPEN) 100 UNIT/ML Inject 0.2 mLs (20 Units total) into the skin in the morning. 9 mL 2  . insulin lispro (HUMALOG KWIKPEN) 100 UNIT/ML KwikPen Inject 0-0.1 mLs (0-10 Units total) into the skin 3 (three) times daily. (Patient taking differently: Inject 2-10 Units into the skin 3 (three) times daily with meals. ) 12 mL 0  . Lancets (FREESTYLE) lancets Use as instructed 100 each 12  . Melatonin 10 MG CAPS Take 20-30 mg by mouth at bedtime as needed (sleep).    . metFORMIN (GLUCOPHAGE) 1000 MG tablet Take 1 tablet (1,000 mg total) by mouth 2 (two) times daily with a meal. 180 tablet 3  . mirtazapine (REMERON) 30 MG tablet Take 1 tablet (30 mg total) by mouth at bedtime. 90 tablet 3  . montelukast (SINGULAIR) 10 MG tablet Take 1 tablet (10 mg total) by mouth at bedtime. 90 tablet 3  . nitroGLYCERIN (NITRODUR - DOSED IN MG/24 HR) 0.2 mg/hr patch Place 1 patch (0.2 mg total) onto the skin daily. 30 patch 12  . pentoxifylline (TRENTAL) 400 MG CR tablet Take 1 tablet (400 mg total) by mouth 3 (three) times daily with meals. 90 tablet 3  . silver sulfADIAZINE (SILVADENE) 1 % cream Apply 1 application topically daily. Apply to affected area daily plus dry dressing 400 g 3  . sitaGLIPtin (JANUVIA) 100 MG tablet Take 1 tablet (100 mg total) by mouth daily. 30 tablet 10  . sulfamethoxazole-trimethoprim (BACTRIM DS) 800-160 MG tablet Take 1 tablet by mouth 2 (two) times daily. 60  tablet 0  . VALERIAN ROOT PO Take 2 tablets by mouth at bedtime.     No current facility-administered medications for this visit.    REVIEW OF SYSTEMS:  [X]  denotes positive finding, [ ]  denotes negative finding Cardiac  Comments:  Chest pain or chest pressure:    Shortness of breath upon exertion:    Short of breath when lying flat:    Irregular heart rhythm:        Vascular    Pain in calf, thigh, or hip brought on by ambulation:    Pain in feet at night that wakes you up from your sleep:     Blood clot in your veins:    Leg swelling:         Pulmonary    Oxygen at home:    Productive cough:     Wheezing:         Neurologic    Sudden weakness in arms or legs:     Sudden numbness in arms or legs:     Sudden onset of difficulty speaking or slurred speech:    Temporary loss of vision in one eye:     Problems with dizziness:         Gastrointestinal    Blood in stool:     Vomited blood:         Genitourinary    Burning when urinating:     Blood in urine:        Psychiatric    Major depression:  Hematologic    Bleeding problems:    Problems with blood clotting too easily:        Skin    Rashes or ulcers:        Constitutional    Fever or chills:      PHYSICAL EXAM: Vitals:   01/09/20 1220  BP: 117/81  Pulse: 91  Resp: 18  Temp: 98.2 F (36.8 C)  TempSrc: Temporal  SpO2: 100%  Weight: 260 lb (117.9 kg)  Height: 6\' 3"  (1.905 m)    GENERAL: The patient is a well-nourished male, in no acute distress. The vital signs are documented above. CARDIAC: There is a regular rate and rhythm.  VASCULAR:  Right femoral pulse palpable Palpable right dorsalis pedis and posterior tibial pulse Open right TMA with foul smelling drainage as pictured below PULMONARY: There is good air exchange bilaterally without wheezing or rales. ABDOMEN: Soft and non-tender with normal pitched bowel sounds.  MUSCULOSKELETAL: There are no major deformities or  cyanosis. NEUROLOGIC: No focal weakness or paresthesias are detected.       DATA:   I independently reviewed ABIs which are greater than 1 and triphasic at the ankle  Assessment/Plan:  49 year old male presents for second opinion regarding nonhealing right TMA in the setting of previous toe amputation for osteomyelitis.  He has palpable dorsalis pedis and posterior tibial pulse on exam and normal triphasic signals at the ankle, so I think he has adequate inflow for healing. Discussed with the patient and his wife that the tissue looks necrotic and there is also foul drainage from the wound suggesting underlying infection.  I think he is at exceedingly high risk for limb loss but I have offered additional attempt at debridement with likely VAC placement and leaving the wound open.  I discussed that even with excellent wound care this could be a very prolonged process of getting this wound to heal and ultimately it may still not heal.  We will send culture from the OR and likely will need IV antibiotics.  We will plan to admit after surgery tomorrow.   [name], MD [facility] Office: [phone]

## 2020-01-09 NOTE — Telephone Encounter (Signed)
Pt states he is having surgery tomorrow on his foot and wishes to cancel 5-6 colon- he will CB when he is ready to RS- after healing- Recall placed for 05-2020 as well

## 2020-01-09 NOTE — Progress Notes (Signed)
Spoke with Christopher Fernandez for pre-op call. I had spoken with him in March with his last surgery and he states nothing has changed with his medical and surgical history. Christopher Fernandez states his fasting blood sugar is usually between 120-160. Last A1C was 8.7 on 12/12/19. Christopher Fernandez instructed to take 1/2 of his regular dose of Basaglar insulin in the AM, he will take 10 units. He is not to take Metformin, Jardiance or Januvia in the AM. Instructed Christopher Fernandez to check his blood sugar when he gets up in the AM and every 2 hours until he leaves for the hospital. If blood sugar is >220 take 1/2 of usual correction dose of [place] insulin. If blood sugar is 70 or below, treat with 1/2 cup of clear juice (apple or cranberry) and recheck blood sugar 15 minutes after drinking juice. If blood sugar continues to be 70 or below, call the Short Stay department and ask to speak to a nurse. Christopher Fernandez voiced understanding  Covid test done today, results pending. Christopher Fernandez states he is in quarantine and understands he needs to stay in quarantine until he comes to the hospital in the morning.

## 2020-01-10 ENCOUNTER — Inpatient Hospital Stay (HOSPITAL_COMMUNITY)
Admission: RE | Admit: 2020-01-10 | Discharge: 2020-01-13 | DRG: 464 | Disposition: A | Discharge status: Home Health | Payer: 59 | Attending: Vascular Surgery | Admitting: Vascular Surgery

## 2020-01-10 ENCOUNTER — Inpatient Hospital Stay (HOSPITAL_COMMUNITY): Payer: 59

## 2020-01-10 ENCOUNTER — Encounter (HOSPITAL_COMMUNITY): Payer: Self-pay | Admitting: Vascular Surgery

## 2020-01-10 ENCOUNTER — Encounter (HOSPITAL_COMMUNITY)
Admission: RE | Disposition: A | Discharge status: Home / Self Care | Payer: Self-pay | Source: Home / Self Care | Attending: Vascular Surgery

## 2020-01-10 DIAGNOSIS — M1990 Unspecified osteoarthritis, unspecified site: Secondary | ICD-10-CM | POA: Diagnosis present

## 2020-01-10 DIAGNOSIS — Z20822 Contact with and (suspected) exposure to covid-19: Secondary | ICD-10-CM | POA: Diagnosis present

## 2020-01-10 DIAGNOSIS — Z83438 Family history of other disorder of lipoprotein metabolism and other lipidemia: Secondary | ICD-10-CM

## 2020-01-10 DIAGNOSIS — B9562 Methicillin resistant Staphylococcus aureus infection as the cause of diseases classified elsewhere: Secondary | ICD-10-CM | POA: Diagnosis not present

## 2020-01-10 DIAGNOSIS — B9689 Other specified bacterial agents as the cause of diseases classified elsewhere: Secondary | ICD-10-CM | POA: Diagnosis not present

## 2020-01-10 DIAGNOSIS — Z91012 Allergy to eggs: Secondary | ICD-10-CM

## 2020-01-10 DIAGNOSIS — S98319A Complete traumatic amputation of unspecified midfoot, initial encounter: Secondary | ICD-10-CM | POA: Diagnosis not present

## 2020-01-10 DIAGNOSIS — M869 Osteomyelitis, unspecified: Secondary | ICD-10-CM | POA: Diagnosis present

## 2020-01-10 DIAGNOSIS — Z885 Allergy status to narcotic agent status: Secondary | ICD-10-CM | POA: Diagnosis not present

## 2020-01-10 DIAGNOSIS — L089 Local infection of the skin and subcutaneous tissue, unspecified: Secondary | ICD-10-CM | POA: Diagnosis not present

## 2020-01-10 DIAGNOSIS — T8189XA Other complications of procedures, not elsewhere classified, initial encounter: Secondary | ICD-10-CM | POA: Diagnosis not present

## 2020-01-10 DIAGNOSIS — F17210 Nicotine dependence, cigarettes, uncomplicated: Secondary | ICD-10-CM | POA: Diagnosis not present

## 2020-01-10 DIAGNOSIS — Z89421 Acquired absence of other right toe(s): Secondary | ICD-10-CM | POA: Diagnosis not present

## 2020-01-10 DIAGNOSIS — Z79899 Other long term (current) drug therapy: Secondary | ICD-10-CM

## 2020-01-10 DIAGNOSIS — Z8701 Personal history of pneumonia (recurrent): Secondary | ICD-10-CM

## 2020-01-10 DIAGNOSIS — I10 Essential (primary) hypertension: Secondary | ICD-10-CM | POA: Diagnosis not present

## 2020-01-10 DIAGNOSIS — Z794 Long term (current) use of insulin: Secondary | ICD-10-CM | POA: Diagnosis not present

## 2020-01-10 DIAGNOSIS — E1169 Type 2 diabetes mellitus with other specified complication: Secondary | ICD-10-CM | POA: Diagnosis present

## 2020-01-10 DIAGNOSIS — J90 Pleural effusion, not elsewhere classified: Secondary | ICD-10-CM | POA: Diagnosis not present

## 2020-01-10 DIAGNOSIS — S98011A Complete traumatic amputation of right foot at ankle level, initial encounter: Secondary | ICD-10-CM | POA: Diagnosis not present

## 2020-01-10 DIAGNOSIS — K219 Gastro-esophageal reflux disease without esophagitis: Secondary | ICD-10-CM | POA: Diagnosis present

## 2020-01-10 DIAGNOSIS — Z91018 Allergy to other foods: Secondary | ICD-10-CM

## 2020-01-10 DIAGNOSIS — T8789 Other complications of amputation stump: Secondary | ICD-10-CM

## 2020-01-10 DIAGNOSIS — E1151 Type 2 diabetes mellitus with diabetic peripheral angiopathy without gangrene: Secondary | ICD-10-CM | POA: Diagnosis present

## 2020-01-10 DIAGNOSIS — T8743 Infection of amputation stump, right lower extremity: Secondary | ICD-10-CM | POA: Diagnosis present

## 2020-01-10 DIAGNOSIS — K449 Diaphragmatic hernia without obstruction or gangrene: Secondary | ICD-10-CM | POA: Diagnosis present

## 2020-01-10 DIAGNOSIS — Z833 Family history of diabetes mellitus: Secondary | ICD-10-CM | POA: Diagnosis not present

## 2020-01-10 DIAGNOSIS — Z978 Presence of other specified devices: Secondary | ICD-10-CM | POA: Diagnosis not present

## 2020-01-10 DIAGNOSIS — J449 Chronic obstructive pulmonary disease, unspecified: Secondary | ICD-10-CM | POA: Diagnosis present

## 2020-01-10 DIAGNOSIS — Z8249 Family history of ischemic heart disease and other diseases of the circulatory system: Secondary | ICD-10-CM | POA: Diagnosis not present

## 2020-01-10 DIAGNOSIS — A4902 Methicillin resistant Staphylococcus aureus infection, unspecified site: Secondary | ICD-10-CM | POA: Diagnosis not present

## 2020-01-10 DIAGNOSIS — A498 Other bacterial infections of unspecified site: Secondary | ICD-10-CM | POA: Diagnosis not present

## 2020-01-10 DIAGNOSIS — E11628 Type 2 diabetes mellitus with other skin complications: Secondary | ICD-10-CM | POA: Diagnosis not present

## 2020-01-10 DIAGNOSIS — D649 Anemia, unspecified: Secondary | ICD-10-CM | POA: Diagnosis not present

## 2020-01-10 DIAGNOSIS — F418 Other specified anxiety disorders: Secondary | ICD-10-CM | POA: Diagnosis not present

## 2020-01-10 DIAGNOSIS — E785 Hyperlipidemia, unspecified: Secondary | ICD-10-CM | POA: Diagnosis present

## 2020-01-10 DIAGNOSIS — Z8614 Personal history of Methicillin resistant Staphylococcus aureus infection: Secondary | ICD-10-CM

## 2020-01-10 DIAGNOSIS — L02611 Cutaneous abscess of right foot: Secondary | ICD-10-CM | POA: Diagnosis not present

## 2020-01-10 HISTORY — PX: WOUND DEBRIDEMENT: SHX247

## 2020-01-10 HISTORY — PX: APPLICATION OF WOUND VAC: SHX5189

## 2020-01-10 LAB — COMPREHENSIVE METABOLIC PANEL
ALT: 185 U/L — ABNORMAL HIGH (ref 0–44)
AST: 80 U/L — ABNORMAL HIGH (ref 15–41)
Albumin: 3.8 g/dL (ref 3.5–5.0)
Alkaline Phosphatase: 111 U/L (ref 38–126)
Anion gap: 11 (ref 5–15)
BUN: 12 mg/dL (ref 6–20)
CO2: 22 mmol/L (ref 22–32)
Calcium: 9.1 mg/dL (ref 8.9–10.3)
Chloride: 103 mmol/L (ref 98–111)
Creatinine, Ser: 1.2 mg/dL (ref 0.61–1.24)
GFR calc Af Amer: >60 mL/min (ref >60)
GFR calc non Af Amer: >60 mL/min (ref >60)
Glucose, Bld: 163 mg/dL — ABNORMAL HIGH (ref 70–99)
Potassium: 5.1 mmol/L (ref 3.5–5.1)
Sodium: 136 mmol/L (ref 135–145)
Total Bilirubin: 0.9 mg/dL (ref 0.3–1.2)
Total Protein: 7.3 g/dL (ref 6.5–8.1)

## 2020-01-10 LAB — CBC
HCT: 39.7 % (ref 39.0–52.0)
Hemoglobin: 12.4 g/dL — ABNORMAL LOW (ref 13.0–17.0)
MCH: 27.4 pg (ref 26.0–34.0)
MCHC: 31.2 g/dL (ref 30.0–36.0)
MCV: 87.6 fL (ref 80.0–100.0)
Platelets: 377 10*3/uL (ref 150–400)
RBC: 4.53 MIL/uL (ref 4.22–5.81)
RDW: 14 % (ref 11.5–15.5)
WBC: 5.9 10*3/uL (ref 4.0–10.5)
nRBC: 0 % (ref 0.0–0.2)

## 2020-01-10 LAB — URINALYSIS, ROUTINE W REFLEX MICROSCOPIC
Bacteria, UA: NONE SEEN
Bilirubin Urine: NEGATIVE
Glucose, UA: >=500 mg/dL — AB
Hgb urine dipstick: NEGATIVE
Ketones, ur: 5 mg/dL — AB
Leukocytes,Ua: NEGATIVE
Nitrite: NEGATIVE
Protein, ur: NEGATIVE mg/dL
Specific Gravity, Urine: 1.033 — ABNORMAL HIGH (ref 1.005–1.030)
pH: 6 (ref 5.0–8.0)

## 2020-01-10 LAB — GLUCOSE, CAPILLARY
Glucose-Capillary: 169 mg/dL — ABNORMAL HIGH (ref 70–99)
Glucose-Capillary: 119 mg/dL — ABNORMAL HIGH (ref 70–99)
Glucose-Capillary: 143 mg/dL — ABNORMAL HIGH (ref 70–99)

## 2020-01-10 LAB — TYPE AND SCREEN
ABO/RH(D): O POS
Antibody Screen: NEGATIVE

## 2020-01-10 LAB — PROTIME-INR
INR: 1 (ref 0.8–1.2)
Prothrombin Time: 12.9 seconds (ref 11.4–15.2)

## 2020-01-10 LAB — HEMOGLOBIN A1C
Hgb A1c MFr Bld: 8.4 % — ABNORMAL HIGH (ref 4.8–5.6)
Mean Plasma Glucose: 194.38 mg/dL

## 2020-01-10 LAB — ABO/RH: ABO/RH(D): O POS

## 2020-01-10 LAB — APTT: aPTT: 33 seconds (ref 24–36)

## 2020-01-10 SURGERY — DEBRIDEMENT, WOUND
Anesthesia: Monitor Anesthesia Care | Site: Foot | Laterality: Right

## 2020-01-10 MED ORDER — MIDAZOLAM HCL 2 MG/2ML IJ SOLN
INTRAMUSCULAR | Status: AC
  Filled 2020-01-10: qty 2

## 2020-01-10 MED ORDER — MIDAZOLAM HCL 5 MG/5ML IJ SOLN
INTRAMUSCULAR | Status: DC | PRN
  Administered 2020-01-10: 2 mg via INTRAVENOUS

## 2020-01-10 MED ORDER — ACETAMINOPHEN 325 MG RE SUPP: 325.0000 mg | RECTAL | Status: DC | PRN

## 2020-01-10 MED ORDER — FENTANYL CITRATE (PF) 250 MCG/5ML IJ SOLN
INTRAMUSCULAR | Status: AC
  Filled 2020-01-10: qty 5

## 2020-01-10 MED ORDER — ONDANSETRON HCL 4 MG/2ML IJ SOLN
INTRAMUSCULAR | Status: AC
  Filled 2020-01-10: qty 2

## 2020-01-10 MED ORDER — ARIPIPRAZOLE 2 MG PO TABS
5.0000 mg | ORAL_TABLET | Freq: Every day | ORAL | Status: DC
  Administered 2020-01-10 – 2020-01-13 (×4): 5 mg via ORAL
  Filled 2020-01-10 (×4): qty 3

## 2020-01-10 MED ORDER — POTASSIUM CHLORIDE CRYS ER 20 MEQ PO TBCR: 20.0000 meq | EXTENDED_RELEASE_TABLET | Freq: Every day | ORAL | Status: DC | PRN

## 2020-01-10 MED ORDER — DULOXETINE HCL 60 MG PO CPEP
60.0000 mg | ORAL_CAPSULE | Freq: Every day | ORAL | Status: DC
  Administered 2020-01-10 – 2020-01-13 (×4): 60 mg via ORAL
  Filled 2020-01-10 (×4): qty 1

## 2020-01-10 MED ORDER — SODIUM CHLORIDE 0.9 % IV SOLN
1.0000 g | Freq: Two times a day (BID) | INTRAVENOUS | Status: DC
  Administered 2020-01-10: 1 g via INTRAVENOUS
  Filled 2020-01-10 (×3): qty 1

## 2020-01-10 MED ORDER — METFORMIN HCL 500 MG PO TABS
1000.0000 mg | ORAL_TABLET | Freq: Two times a day (BID) | ORAL | Status: DC
  Administered 2020-01-10 – 2020-01-12 (×5): 1000 mg via ORAL
  Filled 2020-01-10 (×5): qty 2

## 2020-01-10 MED ORDER — CHLORHEXIDINE GLUCONATE CLOTH 2 % EX PADS: 6.0000 | MEDICATED_PAD | Freq: Once | CUTANEOUS | Status: DC

## 2020-01-10 MED ORDER — ACETAMINOPHEN 500 MG PO TABS
1000.0000 mg | ORAL_TABLET | Freq: Once | ORAL | Status: AC
  Administered 2020-01-10: 1000 mg via ORAL
  Filled 2020-01-10: qty 2

## 2020-01-10 MED ORDER — ATORVASTATIN CALCIUM 40 MG PO TABS
40.0000 mg | ORAL_TABLET | Freq: Every day | ORAL | Status: DC
  Administered 2020-01-10 – 2020-01-13 (×4): 40 mg via ORAL
  Filled 2020-01-10 (×4): qty 1

## 2020-01-10 MED ORDER — HYDROMORPHONE HCL 1 MG/ML IJ SOLN
0.2500 mg | INTRAMUSCULAR | Status: DC | PRN
  Administered 2020-01-10 (×2): 0.5 mg via INTRAVENOUS

## 2020-01-10 MED ORDER — HEMOSTATIC AGENTS (NO CHARGE) OPTIME
TOPICAL | Status: DC | PRN
  Administered 2020-01-10: 1 via TOPICAL

## 2020-01-10 MED ORDER — NITROGLYCERIN 0.1 MG/HR TD PT24
0.1000 mg | MEDICATED_PATCH | Freq: Every day | TRANSDERMAL | Status: DC
  Administered 2020-01-10 – 2020-01-13 (×4): 0.1 mg via TRANSDERMAL
  Filled 2020-01-10 (×4): qty 1

## 2020-01-10 MED ORDER — ALBUTEROL SULFATE (2.5 MG/3ML) 0.083% IN NEBU: 3.0000 mL | INHALATION_SOLUTION | RESPIRATORY_TRACT | Status: DC | PRN

## 2020-01-10 MED ORDER — GABAPENTIN 300 MG PO CAPS
300.0000 mg | ORAL_CAPSULE | Freq: Three times a day (TID) | ORAL | Status: DC
  Administered 2020-01-10 – 2020-01-13 (×9): 300 mg via ORAL
  Filled 2020-01-10 (×9): qty 1

## 2020-01-10 MED ORDER — ONDANSETRON HCL 4 MG/2ML IJ SOLN
INTRAMUSCULAR | Status: DC | PRN
  Administered 2020-01-10: 4 mg via INTRAVENOUS

## 2020-01-10 MED ORDER — MONTELUKAST SODIUM 10 MG PO TABS
10.0000 mg | ORAL_TABLET | Freq: Every day | ORAL | Status: DC
  Administered 2020-01-11 – 2020-01-13 (×3): 10 mg via ORAL
  Filled 2020-01-10 (×3): qty 1

## 2020-01-10 MED ORDER — LABETALOL HCL 5 MG/ML IV SOLN: 10.0000 mg | INTRAVENOUS | Status: DC | PRN

## 2020-01-10 MED ORDER — MIRTAZAPINE 15 MG PO TABS
30.0000 mg | ORAL_TABLET | Freq: Every day | ORAL | Status: DC
  Administered 2020-01-10 – 2020-01-12 (×3): 30 mg via ORAL
  Filled 2020-01-10 (×3): qty 2

## 2020-01-10 MED ORDER — MAGNESIUM SULFATE 2 GM/50ML IV SOLN: 2.0000 g | Freq: Every day | INTRAVENOUS | Status: DC | PRN

## 2020-01-10 MED ORDER — LIDOCAINE 2% (20 MG/ML) 5 ML SYRINGE
INTRAMUSCULAR | Status: AC
  Filled 2020-01-10: qty 5

## 2020-01-10 MED ORDER — POLYETHYLENE GLYCOL 3350 17 G PO PACK: 17.0000 g | PACK | Freq: Every day | ORAL | Status: DC | PRN

## 2020-01-10 MED ORDER — HYDRALAZINE HCL 20 MG/ML IJ SOLN: 5.0000 mg | INTRAMUSCULAR | Status: DC | PRN

## 2020-01-10 MED ORDER — METOPROLOL TARTRATE 5 MG/5ML IV SOLN: 2.0000 mg | INTRAVENOUS | Status: DC | PRN

## 2020-01-10 MED ORDER — 0.9 % SODIUM CHLORIDE (POUR BTL) OPTIME
TOPICAL | Status: DC | PRN
  Administered 2020-01-10: 1000 mL

## 2020-01-10 MED ORDER — CEFAZOLIN SODIUM-DEXTROSE 2-4 GM/100ML-% IV SOLN
2.0000 g | INTRAVENOUS | Status: AC
  Administered 2020-01-10: 2 g via INTRAVENOUS

## 2020-01-10 MED ORDER — PROPOFOL 500 MG/50ML IV EMUL
INTRAVENOUS | Status: DC | PRN
  Administered 2020-01-10: 80 ug/kg/min via INTRAVENOUS

## 2020-01-10 MED ORDER — HYDROMORPHONE HCL 1 MG/ML IJ SOLN
INTRAMUSCULAR | Status: AC
  Filled 2020-01-10: qty 1

## 2020-01-10 MED ORDER — PROMETHAZINE HCL 25 MG/ML IJ SOLN: 6.2500 mg | INTRAMUSCULAR | Status: DC | PRN

## 2020-01-10 MED ORDER — LIDOCAINE 2% (20 MG/ML) 5 ML SYRINGE
INTRAMUSCULAR | Status: DC | PRN
Start: 2020-01-10 — End: 2020-01-10
  Administered 2020-01-10: 40 mg via INTRAVENOUS

## 2020-01-10 MED ORDER — OXYCODONE-ACETAMINOPHEN 5-325 MG PO TABS
1.0000 | ORAL_TABLET | ORAL | Status: DC | PRN
  Administered 2020-01-10 – 2020-01-13 (×9): 2 via ORAL
  Filled 2020-01-10 (×9): qty 2

## 2020-01-10 MED ORDER — SIMETHICONE 80 MG PO CHEW
80.0000 mg | CHEWABLE_TABLET | Freq: Four times a day (QID) | ORAL | Status: DC | PRN
  Administered 2020-01-10 – 2020-01-13 (×2): 80 mg via ORAL
  Filled 2020-01-10 (×2): qty 1

## 2020-01-10 MED ORDER — PANTOPRAZOLE SODIUM 40 MG PO TBEC
40.0000 mg | DELAYED_RELEASE_TABLET | Freq: Every day | ORAL | Status: DC
  Administered 2020-01-10 – 2020-01-13 (×4): 40 mg via ORAL
  Filled 2020-01-10 (×4): qty 1

## 2020-01-10 MED ORDER — INSULIN LISPRO (1 UNIT DIAL) 100 UNIT/ML (KWIKPEN): 0.0000 [IU] | PEN_INJECTOR | Freq: Three times a day (TID) | SUBCUTANEOUS | Status: DC

## 2020-01-10 MED ORDER — SODIUM CHLORIDE 0.9 % IV SOLN: INTRAVENOUS | Status: DC

## 2020-01-10 MED ORDER — DOCUSATE SODIUM 100 MG PO CAPS
100.0000 mg | ORAL_CAPSULE | Freq: Every day | ORAL | Status: DC
  Administered 2020-01-11: 100 mg via ORAL
  Filled 2020-01-10 (×3): qty 1

## 2020-01-10 MED ORDER — INSULIN ASPART 100 UNIT/ML ~~LOC~~ SOLN
0.0000 [IU] | Freq: Three times a day (TID) | SUBCUTANEOUS | Status: DC
  Administered 2020-01-10: 2 [IU] via SUBCUTANEOUS
  Administered 2020-01-11 (×2): 3 [IU] via SUBCUTANEOUS
  Administered 2020-01-11 – 2020-01-12 (×2): 2 [IU] via SUBCUTANEOUS

## 2020-01-10 MED ORDER — KETOROLAC TROMETHAMINE 30 MG/ML IJ SOLN
INTRAMUSCULAR | Status: AC
  Filled 2020-01-10: qty 1

## 2020-01-10 MED ORDER — BISACODYL 5 MG PO TBEC: 5.0000 mg | DELAYED_RELEASE_TABLET | Freq: Every day | ORAL | Status: DC | PRN

## 2020-01-10 MED ORDER — LACTATED RINGERS IV SOLN: INTRAVENOUS | Status: DC

## 2020-01-10 MED ORDER — PHENOL 1.4 % MT LIQD: 1.0000 | OROMUCOSAL | Status: DC | PRN

## 2020-01-10 MED ORDER — CEFAZOLIN SODIUM-DEXTROSE 2-4 GM/100ML-% IV SOLN: 2.0000 g | Freq: Three times a day (TID) | INTRAVENOUS | Status: DC

## 2020-01-10 MED ORDER — VANCOMYCIN HCL 1500 MG/300ML IV SOLN
1500.0000 mg | Freq: Two times a day (BID) | INTRAVENOUS | Status: DC
  Administered 2020-01-10 – 2020-01-13 (×6): 1500 mg via INTRAVENOUS
  Filled 2020-01-10 (×7): qty 300

## 2020-01-10 MED ORDER — PHENYLEPHRINE HCL (PRESSORS) 10 MG/ML IV SOLN
INTRAVENOUS | Status: AC
  Filled 2020-01-10: qty 1

## 2020-01-10 MED ORDER — PROPOFOL 10 MG/ML IV BOLUS
INTRAVENOUS | Status: DC | PRN
  Administered 2020-01-10: 15 mg via INTRAVENOUS

## 2020-01-10 MED ORDER — KETOROLAC TROMETHAMINE 30 MG/ML IJ SOLN
30.0000 mg | Freq: Once | INTRAMUSCULAR | Status: AC | PRN
  Administered 2020-01-10: 30 mg via INTRAVENOUS

## 2020-01-10 MED ORDER — LIDOCAINE HCL (PF) 1 % IJ SOLN: INTRAMUSCULAR | Status: DC | PRN

## 2020-01-10 MED ORDER — ACETAMINOPHEN 325 MG PO TABS: 325.0000 mg | ORAL_TABLET | ORAL | Status: DC | PRN

## 2020-01-10 MED ORDER — MONTELUKAST SODIUM 10 MG PO TABS
10.0000 mg | ORAL_TABLET | Freq: Every day | ORAL | Status: DC
  Filled 2020-01-10: qty 1

## 2020-01-10 MED ORDER — DIPHENHYDRAMINE HCL 25 MG PO CAPS: 25.0000 mg | ORAL_CAPSULE | Freq: Four times a day (QID) | ORAL | Status: DC | PRN

## 2020-01-10 MED ORDER — PENTOXIFYLLINE ER 400 MG PO TBCR
400.0000 mg | EXTENDED_RELEASE_TABLET | Freq: Three times a day (TID) | ORAL | Status: DC
  Administered 2020-01-10 – 2020-01-13 (×8): 400 mg via ORAL
  Filled 2020-01-10 (×10): qty 1

## 2020-01-10 MED ORDER — INSULIN GLARGINE 100 UNIT/ML ~~LOC~~ SOLN
20.0000 [IU] | Freq: Every day | SUBCUTANEOUS | Status: DC
  Administered 2020-01-11 – 2020-01-13 (×3): 20 [IU] via SUBCUTANEOUS
  Filled 2020-01-10 (×3): qty 0.2

## 2020-01-10 MED ORDER — FENTANYL CITRATE (PF) 250 MCG/5ML IJ SOLN
INTRAMUSCULAR | Status: DC | PRN
  Administered 2020-01-10 (×2): 25 ug via INTRAVENOUS
  Administered 2020-01-10: 50 ug via INTRAVENOUS

## 2020-01-10 MED ORDER — LIDOCAINE HCL (PF) 1 % IJ SOLN
INTRAMUSCULAR | Status: AC
  Filled 2020-01-10: qty 30

## 2020-01-10 MED ORDER — ONDANSETRON HCL 4 MG/2ML IJ SOLN
4.0000 mg | Freq: Four times a day (QID) | INTRAMUSCULAR | Status: DC | PRN
  Administered 2020-01-10: 4 mg via INTRAVENOUS

## 2020-01-10 MED ORDER — MORPHINE SULFATE (PF) 2 MG/ML IV SOLN: 2.0000 mg | INTRAVENOUS | Status: DC | PRN

## 2020-01-10 MED ORDER — GUAIFENESIN-DM 100-10 MG/5ML PO SYRP: 15.0000 mL | ORAL_SOLUTION | ORAL | Status: DC | PRN

## 2020-01-10 MED ORDER — ALUM & MAG HYDROXIDE-SIMETH 200-200-20 MG/5ML PO SUSP
15.0000 mL | ORAL | Status: DC | PRN
  Filled 2020-01-10: qty 30

## 2020-01-10 SURGICAL SUPPLY — 33 items
CANISTER SUCT 3000ML PPV (MISCELLANEOUS) ×2 IMPLANT
CANISTER WOUND CARE 500ML ATS (WOUND CARE) ×2 IMPLANT
COVER SURGICAL LIGHT HANDLE (MISCELLANEOUS) ×2 IMPLANT
COVER WAND RF STERILE (DRAPES) IMPLANT
DRAPE INCISE IOBAN 66X45 STRL (DRAPES) ×2 IMPLANT
DRAPE ORTHO SPLIT 77X108 STRL (DRAPES) ×2
DRAPE SURG ORHT 6 SPLT 77X108 (DRAPES) ×2 IMPLANT
DRSG VAC ATS LRG SENSATRAC (GAUZE/BANDAGES/DRESSINGS) IMPLANT
DRSG VAC ATS MED SENSATRAC (GAUZE/BANDAGES/DRESSINGS) IMPLANT
DRSG VAC ATS SM SENSATRAC (GAUZE/BANDAGES/DRESSINGS) ×2 IMPLANT
ELECT REM PT RETURN 9FT ADLT (ELECTROSURGICAL) ×2
ELECTRODE REM PT RTRN 9FT ADLT (ELECTROSURGICAL) ×1 IMPLANT
GAUZE 4X4 16PLY RFD (DISPOSABLE) ×2 IMPLANT
GLOVE BIO SURGEON STRL SZ7.5 (GLOVE) ×2 IMPLANT
GLOVE BIOGEL PI IND STRL 8 (GLOVE) ×1 IMPLANT
GLOVE BIOGEL PI INDICATOR 8 (GLOVE) ×1
GOWN STRL REUS W/ TWL LRG LVL3 (GOWN DISPOSABLE) ×1 IMPLANT
GOWN STRL REUS W/ TWL XL LVL3 (GOWN DISPOSABLE) ×2 IMPLANT
GOWN STRL REUS W/TWL LRG LVL3 (GOWN DISPOSABLE) ×1
GOWN STRL REUS W/TWL XL LVL3 (GOWN DISPOSABLE) ×2
HEMOSTAT SNOW SURGICEL 2X4 (HEMOSTASIS) ×2 IMPLANT
KIT BASIN OR (CUSTOM PROCEDURE TRAY) ×2 IMPLANT
KIT TURNOVER KIT B (KITS) ×2 IMPLANT
NS IRRIG 1000ML POUR BTL (IV SOLUTION) ×2 IMPLANT
PACK GENERAL/GYN (CUSTOM PROCEDURE TRAY) ×2 IMPLANT
PAD ARMBOARD 7.5X6 YLW CONV (MISCELLANEOUS) ×4 IMPLANT
PENCIL BUTTON HOLSTER BLD 10FT (ELECTRODE) ×2 IMPLANT
STAPLER VISISTAT 35W (STAPLE) IMPLANT
SUT ETHILON 2 0 PSLX (SUTURE) IMPLANT
SWAB COLLECTION DEVICE MRSA (MISCELLANEOUS) ×4 IMPLANT
SWAB CULTURE ESWAB REG 1ML (MISCELLANEOUS) ×4 IMPLANT
TOWEL GREEN STERILE (TOWEL DISPOSABLE) ×2 IMPLANT
WATER STERILE IRR 1000ML POUR (IV SOLUTION) IMPLANT

## 2020-01-10 NOTE — Transfer of Care (Signed)
Immediate Anesthesia Transfer of Care Note  Patient: Christopher Fernandez  Procedure(s) Performed: incisional DEBRIDEMENT of RIGHT TRANSMETATARSAL WOUND (Right Foot) APPLICATION OF WOUND VAC, right foot (Right Foot)  Patient Location: PACU  Anesthesia Type:MAC  Level of Consciousness: awake, alert  and patient cooperative  Airway & Oxygen Therapy: Patient Spontanous Breathing  Post-op Assessment: Report given to RN and Post -op Vital signs reviewed and stable  Post vital signs: Reviewed and stable  Last Vitals:  Vitals Value Taken Time  BP 115/91 01/10/20 1408  Temp    Pulse 79 01/10/20 1410  Resp 21 01/10/20 1410  SpO2 100 % 01/10/20 1410  Vitals shown include unvalidated device data.  Last Pain:  Vitals:   01/10/20 1040  TempSrc:   PainSc: 0-No pain         Complications: No apparent anesthesia complications

## 2020-01-10 NOTE — H&P (Signed)
History and Physical Interval Note:  01/10/2020 12:06 PM  Christopher Fernandez  has presented today for surgery, with the diagnosis of NON HEALING WOUND  PERIPHERAL VASCULAR DISEASE.  The various methods of treatment have been discussed with the patient and family. After consideration of risks, benefits and other options for treatment, the patient has consented to  Procedure(s): RIGHT TRANSMETATARSAL DEBRIDEMENT WOUND (Right) APPLICATION OF WOUND VAC (Right) as a surgical intervention.  The patient's history has been reviewed, patient examined, no change in status, stable for surgery.  I have reviewed the patient's chart and labs.  Questions were answered to the patient's satisfaction.     [name]  Patient name: Christopher Fernandez MRN: [id number] DOB: 1971-05-23 Sex: male  REASON FOR CONSULT: Second opinion regarding nonhealing right TMA  HPI:  Christopher Fernandez is a 49 y.o. male, with history of COPD, diabetes, hypertension that presents for second opinion regarding nonhealing right TMA. Patient's previously has been under the care of Dr. Sharol Given and had a right second toe amp on 10/18/2019 for osteomyelitis. Ultimately this failed to heal and then on 11/07/2018 when he had a right TMA with a VAC and then this failed to heal and he subsequently had a revision on 11/24/2019. He states that the wound smells bad and Dr. Sharol Given has told him he was likely going to need a transtibial amputation. Wound cultures have grown MRSA in review of records.      Past Medical History:  Diagnosis Date  . Alcoholism ([place])   . Anemia   . Anxiety   . Arthritis   . Asthma   . COPD (chronic obstructive pulmonary disease) ([place])   . Depression   . Diabetes ([place])    type 2  . GERD (gastroesophageal reflux disease)   . History of hiatal hernia   . HTN (hypertension)   . Osteomyelitis ([place])    right forefoot  . Pancreatitis   . Pneumonia   . Wears glasses         Past Surgical History:  Procedure Laterality Date  .  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 AMPUTATION Right 10/18/2019   Procedure: RIGHT SECOND TOE AMPUTATION; Surgeon: Newt Minion, MD; Location: [name]; Service: Orthopedics; Laterality: Right;  . AMPUTATION Right 11/08/2019   Procedure: RIGHT TRANSMETATARSAL AMPUTATION; Surgeon: Newt Minion, MD; Location: [place]; Service: Orthopedics; Laterality: Right;  . APPENDECTOMY    . CHOLECYSTECTOMY  2006   with gallbladder and spleen  . COLONOSCOPY  8-10 years ago    in [place] exam per pt  . HERNIA REPAIR  2008, 2010   hiatal hernia and 1 additional  . PANCREATIC PSEUDOCYST DRAINAGE    . SPLENECTOMY  2006  . STUMP REVISION Right 11/24/2019   Procedure: REVISION RIGHT TRANSMETATARSAL AMPUTATION; Surgeon: Newt Minion, MD; Location: [place]; Service: Orthopedics; Laterality: Right;  . TOTAL KNEE ARTHROPLASTY Right 1982, 1984        Family History  Problem Relation Age of Onset  . Diabetes Mother   . Hypertension Mother   . Hyperlipidemia Mother   . Kidney disease Mother   . Thyroid disease Mother   . Breast cancer Mother    mets  . Lung cancer Mother   . Diabetes Father   . Alcohol abuse Sister   . Sickle cell trait Sister   . Diabetes Brother   . Asthma Brother   . Lung cancer Maternal Grandmother   . Kidney  disease Maternal Grandmother   . Liver cancer Maternal Uncle    x 4-5  . Kidney disease Maternal Uncle   . Kidney disease Paternal Uncle   . Colon cancer Neg Hx   . Colon polyps Neg Hx   . Esophageal cancer Neg Hx   . Rectal cancer Neg Hx   . Stomach cancer Neg Hx    SOCIAL HISTORY:  Social History        Socioeconomic History  . Marital status: Married    Spouse name: Not on file  . Number of children: 1  . Years of education: Not on file  . Highest education level: Not on file  Occupational History  . Not on file  Tobacco Use  . Smoking status: Current Every Day Smoker    Packs/day: 0.50    Types: Cigarettes    Last attempt to quit:  11/22/2019    Years since quitting: 0.1  . Smokeless tobacco: Never Used  Substance and Sexual Activity  . Alcohol use: Not Currently    Comment: 2-3 per day, none since 2019  . Drug use: Not Currently  . Sexual activity: Not on file  Other Topics Concern  . Not on file  Social History Narrative  . Not on file   Social Determinants of Health      Financial Resource Strain:   . Difficulty of Paying Living Expenses:   Food Insecurity:   . Worried About Charity fundraiser in the Last Year:   . Arboriculturist in the Last Year:   Transportation Needs:   . Film/video editor (Medical):   [name] Lack of Transportation (Non-Medical):   Physical Activity:   . Days of Exercise per Week:   . Minutes of Exercise per Session:   Stress:   . Feeling of Stress :   Social Connections:   . Frequency of Communication with Friends and Family:   . Frequency of Social Gatherings with Friends and Family:   . Attends Religious Services:   . Active Member of Clubs or Organizations:   . Attends Archivist Meetings:   [name] Marital Status:   Intimate Partner Violence:   . Fear of Current or Ex-Partner:   . Emotionally Abused:   [name] Physically Abused:   . Sexually Abused:         Allergies  Allergen Reactions  . Eggs Or Egg-Derived Products Rash  . Morphine And Related Other (See Comments)    Cant take because of pancreatitis  . Cocoa Rash         Current Outpatient Medications  Medication Sig Dispense Refill  . albuterol (VENTOLIN HFA) 108 (90 Base) MCG/ACT inhaler Inhale 2-4 puffs into the lungs every 4 (four) hours as needed for wheezing (or cough). (Patient taking differently: Inhale 2 puffs into the lungs every 4 (four) hours as needed for wheezing (or cough). ) 6.7 g 11  . ARIPiprazole (ABILIFY) 5 MG tablet Take 1 tablet (5 mg total) by mouth daily. 90 tablet 2  . atorvastatin (LIPITOR) 40 MG tablet Take 1 tablet (40 mg total) by mouth daily. 90 tablet 3  . bismuth subsalicylate  (PEPTO BISMOL) 262 MG/15ML suspension Take 30 mLs by mouth every 6 (six) hours as needed for indigestion or diarrhea or loose stools.    . diphenhydrAMINE (BENADRYL) 25 MG tablet Take 25 mg by mouth every 6 (six) hours as needed for itching or allergies.     . DULoxetine (CYMBALTA) 60 MG  capsule Take 1 capsule (60 mg total) by mouth daily. 90 capsule 2  . EASY TOUCH PEN NEEDLES 31G X 5 MM MISC Inject 1 application as directed 3 (three) times daily. 100 each 11  . empagliflozin (JARDIANCE) 25 MG TABS tablet Take 25 mg by mouth daily. 90 tablet 3  . fluticasone (FLONASE) 50 MCG/ACT nasal spray Place 1 spray into both nostrils daily as needed for allergies or rhinitis.    . Fluticasone-Salmeterol (ADVAIR DISKUS) 250-50 MCG/DOSE AEPB Inhale 1 puff into the lungs 2 (two) times daily. 180 each 3  . gabapentin (NEURONTIN) 300 MG capsule Take 1 capsule (300 mg total) by mouth 3 (three) times daily. 90 capsule 2  . glucose blood (FREESTYLE LITE) test strip Use as instructed 100 each 12  . ibuprofen (ADVIL) 200 MG tablet Take 800 mg by mouth every 8 (eight) hours as needed for headache or moderate pain.     . Insulin Detemir (LEVEMIR) 100 UNIT/ML Pen Inject 20 Units into the skin daily at 10 pm. 15 mL 11  . Insulin Glargine (BASAGLAR KWIKPEN) 100 UNIT/ML Inject 0.2 mLs (20 Units total) into the skin in the morning. 9 mL 2  . insulin lispro (HUMALOG KWIKPEN) 100 UNIT/ML KwikPen Inject 0-0.1 mLs (0-10 Units total) into the skin 3 (three) times daily. (Patient taking differently: Inject 2-10 Units into the skin 3 (three) times daily with meals. ) 12 mL 0  . Lancets (FREESTYLE) lancets Use as instructed 100 each 12  . Melatonin 10 MG CAPS Take 20-30 mg by mouth at bedtime as needed (sleep).    . metFORMIN (GLUCOPHAGE) 1000 MG tablet Take 1 tablet (1,000 mg total) by mouth 2 (two) times daily with a meal. 180 tablet 3  . mirtazapine (REMERON) 30 MG tablet Take 1 tablet (30 mg total) by mouth at bedtime. 90 tablet 3   . montelukast (SINGULAIR) 10 MG tablet Take 1 tablet (10 mg total) by mouth at bedtime. 90 tablet 3  . nitroGLYCERIN (NITRODUR - DOSED IN MG/24 HR) 0.2 mg/hr patch Place 1 patch (0.2 mg total) onto the skin daily. 30 patch 12  . pentoxifylline (TRENTAL) 400 MG CR tablet Take 1 tablet (400 mg total) by mouth 3 (three) times daily with meals. 90 tablet 3  . silver sulfADIAZINE (SILVADENE) 1 % cream Apply 1 application topically daily. Apply to affected area daily plus dry dressing 400 g 3  . sitaGLIPtin (JANUVIA) 100 MG tablet Take 1 tablet (100 mg total) by mouth daily. 30 tablet 10  . sulfamethoxazole-trimethoprim (BACTRIM DS) 800-160 MG tablet Take 1 tablet by mouth 2 (two) times daily. 60 tablet 0  . VALERIAN ROOT PO Take 2 tablets by mouth at bedtime.     No current facility-administered medications for this visit.   REVIEW OF SYSTEMS:  [X]  denotes positive finding, [ ]  denotes negative finding  Cardiac  Comments:  Chest pain or chest pressure:    Shortness of breath upon exertion:    Short of breath when lying flat:    Irregular heart rhythm:        Vascular    Pain in calf, thigh, or hip brought on by ambulation:    Pain in feet at night that wakes you up from your sleep:     Blood clot in your veins:    Leg swelling:         Pulmonary    Oxygen at home:    Productive cough:     Wheezing:  Neurologic    Sudden weakness in arms or legs:     Sudden numbness in arms or legs:     Sudden onset of difficulty speaking or slurred speech:    Temporary loss of vision in one eye:     Problems with dizziness:         Gastrointestinal    Blood in stool:     Vomited blood:         Genitourinary    Burning when urinating:     Blood in urine:        Psychiatric    Major depression:         Hematologic    Bleeding problems:    Problems with blood clotting too easily:        Skin    Rashes or ulcers:        Constitutional    Fever or chills:    PHYSICAL EXAM:      Vitals:   01/09/20 1220  BP: 117/81  Pulse: 91  Resp: 18  Temp: 98.2 F (36.8 C)  TempSrc: Temporal  SpO2: 100%  Weight: 260 lb (117.9 kg)  Height: 6\' 3"  (1.905 m)   GENERAL: The patient is a well-nourished male, in no acute distress. The vital signs are documented above.  CARDIAC: There is a regular rate and rhythm.  VASCULAR:  Right femoral pulse palpable  Palpable right dorsalis pedis and posterior tibial pulse  Open right TMA with foul smelling drainage as pictured below  PULMONARY: There is good air exchange bilaterally without wheezing or rales.  ABDOMEN: Soft and non-tender with normal pitched bowel sounds.  MUSCULOSKELETAL: There are no major deformities or cyanosis.  NEUROLOGIC: No focal weakness or paresthesias are detected.   DATA:  I independently reviewed ABIs which are greater than 1 and triphasic at the ankle  Assessment/Plan:  49 year old male presents for second opinion regarding nonhealing right TMA in the setting of previous toe amputation for osteomyelitis. He has palpable dorsalis pedis and posterior tibial pulse on exam and normal triphasic signals at the ankle, so I think he has adequate inflow for healing. Discussed with the patient and his wife that the tissue looks necrotic and there is also foul drainage from the wound suggesting underlying infection. I think he is at exceedingly high risk for limb loss but I have offered additional attempt at debridement with likely VAC placement and leaving the wound open. I discussed that even with excellent wound care this could be a very prolonged process of getting this wound to heal and ultimately it may still not heal. We will send culture from the OR and likely will need IV antibiotics. We will plan to admit after surgery tomorrow.  [name], MD  [facility]  Office: [phone]

## 2020-01-10 NOTE — Anesthesia Preprocedure Evaluation (Addendum)
Anesthesia Evaluation  Patient identified by MRN, date of birth, ID band Patient awake    Reviewed: Allergy & Precautions, NPO status , Patient's Chart, lab work & pertinent test results  Airway Mallampati: II  TM Distance: >3 FB Neck ROM: Full    Dental  (+) Missing   Pulmonary asthma , COPD,  COPD inhaler, Current SmokerPatient did not abstain from smoking.,    Pulmonary exam normal breath sounds clear to auscultation       Cardiovascular hypertension, Normal cardiovascular exam Rhythm:Regular Rate:Normal  ECG: NSR, rate 87   Neuro/Psych PSYCHIATRIC DISORDERS Anxiety Depression negative neurological ROS     GI/Hepatic hiatal hernia, (+)     substance abuse  ,   Endo/Other  diabetes, Insulin Dependent, Oral Hypoglycemic Agents  Renal/GU negative Renal ROS     Musculoskeletal  (+) Arthritis ,   Abdominal (+) + obese,   Peds  Hematology HLD   Anesthesia Other Findings NON HEALING WOUND  PERIPHERAL VASCULAR DISEASE  Reproductive/Obstetrics                           Anesthesia Physical Anesthesia Plan  ASA: III  Anesthesia Plan: MAC   Post-op Pain Management:    Induction: Intravenous  PONV Risk Score and Plan: 0 and Ondansetron, Dexamethasone, Propofol infusion, Midazolam and Treatment may vary due to age or medical condition  Airway Management Planned: Simple Face Mask  Additional Equipment:   Intra-op Plan:   Post-operative Plan:   Informed Consent: I have reviewed the patients History and Physical, chart, labs and discussed the procedure including the risks, benefits and alternatives for the proposed anesthesia with the patient or authorized representative who has indicated his/her understanding and acceptance.     Dental advisory given  Plan Discussed with: CRNA  Anesthesia Plan Comments: (Potential post-op regional anesthetic discussed )        Anesthesia Quick  Evaluation

## 2020-01-10 NOTE — Progress Notes (Signed)
Patient arrived to 4E room 13 at this time. Telemetry applied and CCMD notified. V/s and assessment complete. CHG bath. Patient oriented to room and how to call nurse with any needs. Will continue to monitor

## 2020-01-10 NOTE — Op Note (Signed)
Date: January 10, 2020  Preoperative diagnosis: Nonhealing right transmetatarsal amputation  Postoperative diagnosis: Same  Procedure:  1.  Sharp excisional debridement of right transmetatarsal amputation (4.5 cm x 7 cm) 2.  Application of negative pressure wound VAC to right transmetatarsal amputation  Surgeon: Dr. [name], MD  Assistant: Marlinda Mike, PA  Indications: Patient is a 49 year old male who initially underwent right second toe amputation on 10/18/2019 for osteomyelitis with orthopedic surgery.  Ultimately failed to heal and he then had a right transmetatarsal amputation on 11/07/2018.  The TMA also failed to heal and required revision on 11/24/2019.  He presents today for planned debridement and VAC placement after I saw him in clinic yesterday for second opinion given the revision is now non-healing.  Findings: Sharp excisional debridement was performed of the open right TMA in order to get back to healthy tissue margins.  The tissue had robust blood supply that appeared more than adequate for healing.  I did not see any overt evidence of pus or other purulent drainage.  We did take intraoperative wound cultures.  Left the wound open and a negative pressure wound VAC was applied.  Anesthesia: MAC  Details: Patient was taken to the operating room after informed consent was obtained.  Placed on operative table in supine position.  After anesthesia induced the right foot was then prepped draped in usual sterile fashion after his previous dressing was removed.  Initially used Metzenbaum scissors and removed all the previous nylon sutures from the TMA.  He had a large open wound in the middle portion of the TMA incision and ultimately scalpel was used for sharp excisional debridement to get back to healthy tissue margins and excised all the necrotic appearing tissue including skin, subcutaenous tissue, and some fascia.  There was granulation tissue over the head of the  metatarsals and no overt bone was exposed.  Ultimately I debrided back until I was satisfied with the tissue margins.  Both the medial and lateral edge of the transmetatarsal amputation incision appear to be healing well and incorporated.  We sent several intraoperative cultures.  I then used Surgicel snow with manual pressure for prolonged period of time in order to get hemostasis given there was robust bleeding in the wound bed.  Use very minimal cautery.  Ultimately a small wound VAC was then trimmed accordingly and a negative pressure wound VAC was applied using [place]an.  He will be taken to PACU in stable condition.  Complications: None  Condition: Stable  [name], MD [facility] Office: [place]

## 2020-01-10 NOTE — Plan of Care (Signed)
Continue to monitor

## 2020-01-10 NOTE — Anesthesia Procedure Notes (Signed)
Procedure Name: MAC Date/Time: 01/10/2020 12:51 PM Performed by: Janace Litten, CRNA Pre-anesthesia Checklist: Patient identified, Emergency Drugs available, Suction available and Patient being monitored Patient Re-evaluated:Patient Re-evaluated prior to induction Oxygen Delivery Method: Simple face mask

## 2020-01-10 NOTE — Progress Notes (Signed)
Pharmacy Antibiotic Note  Christopher Fernandez is a 48 y.o. male admitted on 01/10/2020 with wound infection.  Pharmacy has been consulted for Vancomycin dosing.  Vancomycin 1500 mg IV Q 12 hrs. Goal AUC 400-550. Expected AUC: 485 SCr used: 1.2  Plan: Start Vancomycin 1500 mg IV q12hr Monitor renal function, C&S and vanc levels as needed  Height: 6\' 3"  (190.5 cm) Weight: 117.9 kg (260 lb) IBW/kg (Calculated) : 84.5  Temp (24hrs), Avg:97.7 F (36.5 C), Min:97.5 F (36.4 C), Max:98 F (36.7 C)  Recent Labs  Lab 01/10/20 1053  WBC 5.9  CREATININE 1.20    Estimated Creatinine Clearance: 100.8 mL/min (by C-G formula based on SCr of 1.2 mg/dL).    Allergies  Allergen Reactions  . Eggs Or Egg-Derived Products Rash  . Morphine And Related Other (See Comments)    Cant take because of pancreatitis  . Cocoa Rash    Antimicrobials this admission: Vanc 4/28 >>   Thank you for allowing pharmacy to be a part of this patient's care.  Alanda Slim, PharmD, [facility] Clinical Pharmacist Please see AMION for all Pharmacists' Contact Phone Numbers 01/10/2020, 4:03 PM

## 2020-01-10 NOTE — Anesthesia Postprocedure Evaluation (Signed)
Anesthesia Post Note  Patient: Christopher Fernandez  Procedure(s) Performed: incisional DEBRIDEMENT of RIGHT TRANSMETATARSAL WOUND (Right Foot) APPLICATION OF WOUND VAC, right foot (Right Foot)     Patient location during evaluation: PACU Anesthesia Type: MAC Level of consciousness: awake Pain management: pain level controlled Vital Signs Assessment: post-procedure vital signs reviewed and stable Respiratory status: spontaneous breathing, nonlabored ventilation, respiratory function stable and patient connected to nasal cannula oxygen Cardiovascular status: stable and blood pressure returned to baseline Postop Assessment: no apparent nausea or vomiting Anesthetic complications: no    Last Vitals:  Vitals:   01/10/20 1523 01/10/20 1546  BP: 122/87 109/72  Pulse: 68 68  Resp: 18 14  Temp: 36.5 C (!) 36.4 C  SpO2: 100% 100%    Last Pain:  Vitals:   01/10/20 1546  TempSrc: Oral  PainSc:                  Christopher Fernandez

## 2020-01-11 ENCOUNTER — Ambulatory Visit: Payer: 59 | Admitting: Orthopedic Surgery

## 2020-01-11 ENCOUNTER — Inpatient Hospital Stay: Payer: Self-pay

## 2020-01-11 DIAGNOSIS — Z89421 Acquired absence of other right toe(s): Secondary | ICD-10-CM

## 2020-01-11 DIAGNOSIS — L089 Local infection of the skin and subcutaneous tissue, unspecified: Secondary | ICD-10-CM

## 2020-01-11 DIAGNOSIS — A498 Other bacterial infections of unspecified site: Secondary | ICD-10-CM

## 2020-01-11 DIAGNOSIS — Z978 Presence of other specified devices: Secondary | ICD-10-CM

## 2020-01-11 DIAGNOSIS — E11628 Type 2 diabetes mellitus with other skin complications: Secondary | ICD-10-CM

## 2020-01-11 DIAGNOSIS — A4902 Methicillin resistant Staphylococcus aureus infection, unspecified site: Secondary | ICD-10-CM

## 2020-01-11 DIAGNOSIS — B9689 Other specified bacterial agents as the cause of diseases classified elsewhere: Secondary | ICD-10-CM | POA: Diagnosis not present

## 2020-01-11 DIAGNOSIS — Z91018 Allergy to other foods: Secondary | ICD-10-CM

## 2020-01-11 DIAGNOSIS — B9562 Methicillin resistant Staphylococcus aureus infection as the cause of diseases classified elsewhere: Secondary | ICD-10-CM

## 2020-01-11 DIAGNOSIS — Z91012 Allergy to eggs: Secondary | ICD-10-CM

## 2020-01-11 DIAGNOSIS — Z885 Allergy status to narcotic agent status: Secondary | ICD-10-CM

## 2020-01-11 DIAGNOSIS — F17210 Nicotine dependence, cigarettes, uncomplicated: Secondary | ICD-10-CM

## 2020-01-11 HISTORY — DX: Methicillin resistant Staphylococcus aureus infection, unspecified site: A49.02

## 2020-01-11 LAB — BASIC METABOLIC PANEL
Anion gap: 8 (ref 5–15)
BUN: 14 mg/dL (ref 6–20)
CO2: 25 mmol/L (ref 22–32)
Calcium: 8.5 mg/dL — ABNORMAL LOW (ref 8.9–10.3)
Chloride: 102 mmol/L (ref 98–111)
Creatinine, Ser: 1.05 mg/dL (ref 0.61–1.24)
GFR calc Af Amer: >60 mL/min (ref >60)
GFR calc non Af Amer: >60 mL/min (ref >60)
Glucose, Bld: 230 mg/dL — ABNORMAL HIGH (ref 70–99)
Potassium: 4.7 mmol/L (ref 3.5–5.1)
Sodium: 135 mmol/L (ref 135–145)

## 2020-01-11 LAB — CBC
HCT: 33.5 % — ABNORMAL LOW (ref 39.0–52.0)
Hemoglobin: 10.4 g/dL — ABNORMAL LOW (ref 13.0–17.0)
MCH: 27.2 pg (ref 26.0–34.0)
MCHC: 31 g/dL (ref 30.0–36.0)
MCV: 87.5 fL (ref 80.0–100.0)
Platelets: 311 10*3/uL (ref 150–400)
RBC: 3.83 MIL/uL — ABNORMAL LOW (ref 4.22–5.81)
RDW: 14 % (ref 11.5–15.5)
WBC: 6.6 10*3/uL (ref 4.0–10.5)
nRBC: 0 % (ref 0.0–0.2)

## 2020-01-11 MED ORDER — CHLORHEXIDINE GLUCONATE CLOTH 2 % EX PADS
6.0000 | MEDICATED_PAD | Freq: Every day | CUTANEOUS | Status: DC
  Administered 2020-01-13: 6 via TOPICAL

## 2020-01-11 MED ORDER — SODIUM CHLORIDE 0.9 % IV SOLN
2.0000 g | Freq: Three times a day (TID) | INTRAVENOUS | Status: DC
  Administered 2020-01-11 – 2020-01-13 (×7): 2 g via INTRAVENOUS
  Filled 2020-01-11 (×9): qty 2

## 2020-01-11 MED ORDER — SODIUM CHLORIDE 0.9% FLUSH: 10.0000 mL | INTRAVENOUS | Status: DC | PRN

## 2020-01-11 MED ORDER — HEPARIN SODIUM (PORCINE) 5000 UNIT/ML IJ SOLN
5000.0000 [IU] | Freq: Three times a day (TID) | INTRAMUSCULAR | Status: DC
  Administered 2020-01-11 – 2020-01-13 (×6): 5000 [IU] via SUBCUTANEOUS
  Filled 2020-01-11 (×6): qty 1

## 2020-01-11 NOTE — Progress Notes (Signed)
   Patient's previously has been under the care of Dr. Sharol Given and had a right second toe amp on 10/18/2019 for osteomyelitis. Ultimately this failed to heal and then on 11/07/2018 when he had a right TMA with a VAC and then this failed to heal and he subsequently had a revision on 11/24/2019.  Wound cultures grew Serratia Marcescens and MRSA on 11/24/19.  On 01/10/20 MRSA againNew cultures Pending I & D.  Complicated by DM.  Currently on  Cefepime and Vanc.  Neg for leukocytosis and afebrile.  Roxy Horseman PA-C

## 2020-01-11 NOTE — Consult Note (Signed)
   [facility] CM Inpatient Consult   01/11/2020  Christopher Fernandez 05/24/1971 [id number]     Patient is currently assigned with [facility] Management RN  for chronic disease management services for post hospital follow up.   Our community based plan of care has focused on disease management and community resource support.   Patient will receive a post hospital call and will be evaluated for assessments and disease process education.     Plan: Call received from with inpatient Transition of Care [facility] and patient is agreeable to follow up.  For additional questions or referrals please contact:   Natividad Brood, RN BSN [facility] Liaison  [phone] business mobile phone Toll free office [phone]  Fax number: [phone] [email or web address]

## 2020-01-11 NOTE — Progress Notes (Signed)
PHARMACY CONSULT NOTE FOR:  OUTPATIENT  PARENTERAL ANTIBIOTIC THERAPY (OPAT)  Indication: presumed osteomyelitis of right foot Regimen: Vancomycin IV 1500 mg Q12H ,  cefepime IV 2 G Q8H End date: 02/22/2020  IV antibiotic discharge orders are pended. To discharging provider:  please sign these orders via discharge navigator,  Select New Orders & click on the button choice - Manage This Unsigned Work.     Thank you for allowing pharmacy to be a part of this patient's care.  [place] 01/11/2020, 10:49 AM

## 2020-01-11 NOTE — TOC Initial Note (Addendum)
Transition of Care (TOC) - Initial/Assessment Note  Marvetta Gibbons RN, BSN Transitions of Care Unit 4E- RN Case Manager [phone]   Patient Details  Name: Christopher Fernandez MRN: [id number] Date of Birth: 1971/02/05  Transition of Care [facility]) CM/SW Contact:    Dawayne Patricia, RN Phone Number: 01/11/2020, 4:11 PM  Clinical Narrative:                 Pt s/p TMA, from home with wife who is a Marine scientist. Orders place for [facility] for wound VAC needs m/w/f, pt will also need 6 wks of home IV abx (Vanc. And cefepime) PICC line being placed today. Spoke with patient and wife regarding transition of care needs- they do not have a choice for [facility] agency and give CM permission to act on their behalf to secure an agency that can provide needed services. Pam with [facility] has already been contacted for home IV abx need by ID and patient/wife agreeable to use them for IV abx needs- Pam to do bedside teaching today. Per pt he would like a RW for home- will have order placed and contact [place] with [facility].  Discussed [facility] benefit with pt/wife- both are agreeable to [facility] f/u post discharge- [facility] referral placed and call made to [place] with [facility].  Order form for [facility] home wound VAC has been signed- form faxed to [place] with [facility]- and spoke with her regarding home VAC needs- will notify her once [facility] agency has been secured.  Calls made to following agencies for [facility] needs:  [name]- unable to accept  [facility]- unable to accept- no nursing next week [facility]- not in [employer]- awaiting return call [facility]- not in network [facility]- awaiting return call  CM will continue to follow for transition of care needs, as per pt and wife pt will need to d/c Saturday - as wife will be out of town tomorrow for a funeral.     Expected Discharge Plan: [place] Barriers to Discharge: Continued Medical Work up   Patient Goals and CMS Choice   CMS Medicare.gov Compare Post Acute Care list  provided to:: Patient Choice offered to / list presented to : Patient, Spouse  Expected Discharge Plan and Services Expected Discharge Plan: [place] Choice: Durable Medical Equipment, Home Health Living arrangements for the past 2 months: Apartment                 DME Arranged: Vac DME Agency: [facility] Date DME Agency Contacted: 01/11/20 Time DME Agency Contacted: [phone] Representative spoke with at DME Agency: Olivia Mackie HH Arranged: RN, IV Antibiotics          Prior Living Arrangements/Services Living arrangements for the past 2 months: Apartment Lives with:: Spouse Patient language and need for interpreter reviewed:: Yes        Need for Family Participation in Patient Care: Yes (Comment) Care giver support system in place?: Yes (comment) Current home services: DME Criminal Activity/Legal Involvement Pertinent to Current Situation/Hospitalization: No - Comment as needed  Activities of Daily Living Home Assistive Devices/Equipment: Eyeglasses, Contact lenses ADL Screening (condition at time of admission) Patient's cognitive ability adequate to safely complete daily activities?: Yes Is the patient deaf or have difficulty hearing?: No Does the patient have difficulty seeing, even when wearing glasses/contacts?: No Does the patient have difficulty concentrating, remembering, or making decisions?: No Patient able to express need for assistance with ADLs?: Yes  Does the patient have difficulty dressing or bathing?: No Independently performs ADLs?: Yes (appropriate for developmental age) Does the patient have difficulty walking or climbing stairs?: Yes Weakness of Legs: Both Weakness of Arms/Hands: None  Permission Sought/Granted Permission sought to share information with : Chartered certified accountant granted to share information with : Yes, Verbal Permission Granted     Permission granted to share info w AGENCY: [place] agencies         Emotional Assessment Appearance:: Appears stated age Attitude/Demeanor/Rapport: Engaged Affect (typically observed): Appropriate, Pleasant Orientation: : Oriented to Self, Oriented to Place, Oriented to  Time, Oriented to Situation Alcohol / Substance Use: Not Applicable Psych Involvement: No (comment)  Admission diagnosis:  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 Serratia infection 01/11/2020  . MRSA infection 01/11/2020  . 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[place]) 01/10/2020  . S/P transmetatarsal amputation of foot, right ([place]) 01/09/2020  . Asplenia 12/13/2019  . Scrotum swelling 06/13/2019  . Exocrine pancreatic insufficiency 06/14/2018  . Uncontrolled diabetes mellitus with hyperglycemia, with long-term current use of insulin ([place]) 02/24/2018  . Alcohol use disorder, severe, dependence ([place]) 02/24/2018  . Anxiety associated with depression 02/24/2018  . GERD (gastroesophageal reflux disease) 02/24/2018  . Anemia 02/24/2018  . Asthma, intermittent 02/24/2018  . Tobacco use disorder 02/24/2018   PCP:  Danna Hefty, DO Pharmacy:   [place], [place] - [address] [facility] [address] [place] [address] Phone: [phone] Fax: [phone]     Social Determinants of Health (SDOH) Interventions    Readmission Risk Interventions No flowsheet data found.

## 2020-01-11 NOTE — Progress Notes (Deleted)
Physical Therapy Treatment Patient Details Name: Christopher Fernandez MRN: [id number] DOB: [birth year] Today's Date: 01/11/2020    History of Present Illness Patient is a 49 year old male who initially underwent right second toe amputation on 10/18/2019 for osteomyelitis with orthopedic surgery.  Ultimately failed to heal and he then had a right transmetatarsal amputation on 11/07/2018.  The TMA also failed to heal and required revision on 11/24/2019.  He presents  for planned debridement and VAC placement    PT Comments    Pt admitted with/for revision of the failed R foot amputations.  Pt needing supervision for basic mobility at this time..  Pt currently limited functionally due to the problems listed below.  (see problems list.)  Pt will benefit from PT to maximize function and safety to be able to get home safely with limited available assist from his family. \   Follow Up Recommendations  No PT follow up     Equipment Recommendations  Rolling walker with 5" wheels    Recommendations for Other Services       Precautions / Restrictions Precautions Precautions: Other (comment) Precaution Comments: NWB R LE Required Braces or Orthoses: Other Brace Other Brace: R foot amputation shoe Restrictions Weight Bearing Restrictions: Yes RLE Weight Bearing: Non weight bearing Other Position/Activity Restrictions: R LE    Mobility  Bed Mobility Overal bed mobility: Modified Independent             General bed mobility comments: pt in recliner upon arrival  Transfers Overall transfer level: Needs assistance Equipment used: Rolling walker (2 wheeled) Transfers: Sit to/from [facility] Sit to Stand: Supervision Stand pivot transfers: Supervision       General transfer comment: sup - Mod I  Ambulation/Gait Ambulation/Gait assistance: Supervision Gait Distance (Feet): 30 Feet Assistive device: Rolling walker (2 wheeled) Gait Pattern/deviations: Step-to pattern Gait  velocity: slower Gait velocity interpretation: <1.8 ft/sec, indicate of risk for recurrent falls General Gait Details: pt used RW well with a "swing to" pattern.  Smooth and steady.   Stairs             Wheelchair Mobility    Modified Rankin (Stroke Patients Only)       Balance Overall balance assessment: Mild deficits observed, not formally tested                                          Cognition Arousal/Alertness: Awake/alert Behavior During Therapy: WFL for tasks assessed/performed Overall Cognitive Status: Within Functional Limits for tasks assessed                                        Exercises      General Comments        Pertinent Vitals/Pain      Home Living Family/patient expects to be discharged to:: Private residence Living Arrangements: Spouse/significant other Available Help at Discharge: Family Type of Home: House Home Access: Level entry   Home Layout: One level Home Equipment: None      Prior Function Level of Independence: Independent          PT Goals (current goals can now be found in the care plan section) Acute Rehab PT Goals Patient Stated Goal: go home PT Goal Formulation: With patient Time For Goal Achievement: 01/18/20 Potential to Achieve  Goals: Good    Frequency    Min 3X/week      PT Plan      Co-evaluation              AM-PAC PT "6 Clicks" Mobility   Outcome Measure  Help needed turning from your back to your side while in a flat bed without using bedrails?: None Help needed moving from lying on your back to sitting on the side of a flat bed without using bedrails?: None Help needed moving to and from a bed to a chair (including a wheelchair)?: A Little Help needed standing up from a chair using your arms (e.g., wheelchair or bedside chair)?: A Little Help needed to walk in hospital room?: A Little Help needed climbing 3-5 steps with a railing? : A Lot 6 Click  Score: 19    End of Session   Activity Tolerance: Patient tolerated treatment well Patient left: in bed;with call bell/phone within reach Nurse Communication: Mobility status PT Visit Diagnosis: Unsteadiness on feet (R26.81);Other abnormalities of gait and mobility (R26.89);Difficulty in walking, not elsewhere classified (R26.2)     Time: [id number] PT Time Calculation (min) (ACUTE ONLY): 36 min  Charges:  $Gait Training: 8-22 mins                     01/11/2020  Ginger Carne., PT Acute Rehabilitation Services [phone]  (pager) [phone]  (office)   Tessie Fass Javares Kaufhold 01/11/2020, 4:53 PM

## 2020-01-11 NOTE — Evaluation (Signed)
Occupational Therapy Evaluation Patient Details Name: Christopher Fernandez MRN: [id number] DOB: April 11, 1971 Today's Date: 01/11/2020    History of Present Illness Patient is a 49 year old male who initially underwent right second toe amputation on 10/18/2019 for osteomyelitis with orthopedic surgery.  Ultimately failed to heal and he then had a right transmetatarsal amputation on 11/07/2018.  The TMA also failed to heal and required revision on 11/24/2019.  He presents  for planned debridement and VAC placement   Clinical Impression   Pt is at sup - mod I level with ADLs/selcare and ADL mobility/selfcare and ADL mobility using RW. Pt with previous R foot surgeries and very familiar with ADL compensatory techniques and mobility safety. All education completed and no further acute OT is indicated at this time.     Follow Up Recommendations  No OT follow up;Supervision - Intermittent    Equipment Recommendations  None recommended by OT    Recommendations for Other Services       Precautions / Restrictions Precautions Precautions: Other (comment) Precaution Comments: NWB R LE Required Braces or Orthoses: Other Brace Other Brace: R foot amputation shoe Restrictions Weight Bearing Restrictions: Yes RLE Weight Bearing: Non weight bearing Other Position/Activity Restrictions: R LE      Mobility Bed Mobility               General bed mobility comments: pt in recliner upon arrival  Transfers Overall transfer level: Needs assistance Equipment used: Rolling walker (2 wheeled) Transfers: Sit to/from [facility] Sit to Stand: Supervision Stand pivot transfers: Supervision       General transfer comment: sup - Mod I    Balance Overall balance assessment: Mild deficits observed, not formally tested                                         ADL either performed or assessed with clinical judgement   ADL Overall ADL's : Modified independent                                        General ADL Comments: sup - Mod I with LB selfcare     Vision Baseline Vision/History: Wears glasses Wears Glasses: At all times Patient Visual Report: No change from baseline       Perception     Praxis      Pertinent Vitals/Pain Pain Assessment: No/denies pain     Hand Dominance     Extremity/Trunk Assessment Upper Extremity Assessment Upper Extremity Assessment: Overall WFL for tasks assessed   Lower Extremity Assessment Lower Extremity Assessment: Defer to PT evaluation   Cervical / Trunk Assessment Cervical / Trunk Assessment: Normal   Communication Communication Communication: No difficulties   Cognition Arousal/Alertness: Awake/alert Behavior During Therapy: WFL for tasks assessed/performed Overall Cognitive Status: Within Functional Limits for tasks assessed                                     General Comments       Exercises     Shoulder Instructions      Home Living Family/patient expects to be discharged to:: Private residence Living Arrangements: Spouse/significant other Available Help at Discharge: Family Type of Home: House Home Access: Level entry  Home Layout: One level     Bathroom Shower/Tub: Teacher, early years/pre: Standard     Home Equipment: None          Prior Functioning/Environment Level of Independence: Independent                 OT Problem List: Decreased activity tolerance      OT Treatment/Interventions:      OT Goals(Current goals can be found in the care plan section) Acute Rehab OT Goals Patient Stated Goal: go home OT Goal Formulation: With patient  OT Frequency:     Barriers to D/C:            Co-evaluation              AM-PAC OT "6 Clicks" Daily Activity     Outcome Measure Help from another person eating meals?: None Help from another person taking care of personal grooming?: None Help from another person  toileting, which includes using toliet, bedpan, or urinal?: A Little Help from another person bathing (including washing, rinsing, drying)?: A Little Help from another person to put on and taking off regular upper body clothing?: None Help from another person to put on and taking off regular lower body clothing?: A Little 6 Click Score: 21   End of Session Equipment Utilized During Treatment: Gait belt;Rolling walker  Activity Tolerance: Patient tolerated treatment well Patient left: in chair  OT Visit Diagnosis: Unsteadiness on feet (R26.81);Other abnormalities of gait and mobility (R26.89)                Time: [id number] OT Time Calculation (min): 28 min Charges:  OT General Charges $OT Visit: 1 Visit OT Evaluation $OT Eval Low Complexity: 1 Low OT Treatments $Therapeutic Activity: 8-22 mins   Britt Bottom 01/11/2020, 11:11 AM

## 2020-01-11 NOTE — Consult Note (Signed)
[facility] for Infectious Disease    Date of Admission:  01/10/2020      Total days of antibiotics 2  Day 2 Vancomycin Cefepime                Reason for Consult: R foot wound     Referring Provider: Carlis Abbott  Primary Care Provider: Danna Hefty, DO    Assessment: Christopher Fernandez is a 49 y.o. diabetic male smoker who is here with non-healing wound of recent R TMA. He has cultured out MRSA and serratia consistently in the past and has been most recently on Bactrim x 30d. Intraoperative cultures are pending currently.    Plain films in February did not show any osteomyelitis of the foot at that time, however he has had ongoing surgery with recent TMA and revision. Not sure doing an MRI would be helpful at this time given recent surgery and would treat on presumption there is some osteomyelitis involved. Plan for 4-6 weeks of IV therapy to target Serratia and MRSA. Would increase Cefepime dose to 2 gm Q8h for bone penetration. Vancomycin for AUC dosing per pharmacy.   He has had a PICC line before and we discuss replacing this today. His wife is a Marine scientist and is comfortable with this plan and would like to be most aggressive here to salvage foot.   Wound care per [facility]VS team.  Counseled to stop smoking, optimized diabetes control, adequate nutritional intake and offloading of foot with recommended boot.    Plan: 1. Continue vancomycin for AUC dosing 2. Increase Cefepime 2 gm IV q8h 3. PICC line to be placed (ordered) 4. D/C per [facility]VS but we will need through tomorrow to organize home health and place PICC please  5. Vanc trough tomorrow to help with home Vancomycin dose.    OPAT ORDERS:  Diagnosis: Infected R TMA   Culture Result: Serratia, MRSA   Allergies  Allergen Reactions  . Eggs Or Egg-Derived Products Rash  . Morphine And Related Other (See Comments)    Cant take because of pancreatitis  . Cocoa Rash    Discharge antibiotics to be given via PICC  line:  Per pharmacy protocol Vancomycin  Aim for Vancomycin trough 15-20 or AUC 400-550 (unless otherwise indicated)  + Cefepime 2 gm IV Q8h   Duration: 6 weeks    End Date: 02/22/2020  [facility] Care Per Protocol with [employer] Use: Home health RN for IV administration and teaching, line care and labs.    Labs weekly while on IV antibiotics: _x_ CBC with differential _x_ BMP TWICE WEEKLY  __ CMP _x_ CRP _x_ ESR _x_ Vancomycin trough __ CK  _x_ Please pull PIC at completion of IV antibiotics __ Please leave PIC in place until doctor has seen patient or been notified  Fax weekly labs to [phone]  Clinic Follow Up Appt: 6/1 @ 10:30 am @ [facility] with Dr. 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 ARIPiprazole  5 mg Oral Daily  . atorvastatin  40 mg Oral Daily  . docusate sodium  100 mg Oral Daily  . DULoxetine  60 mg Oral Daily  . gabapentin  300 mg Oral TID  . insulin aspart  0-15 Units Subcutaneous TID WC  . insulin glargine  20 Units Subcutaneous Daily  . metFORMIN  1,000 mg Oral BID WC  .  mirtazapine  30 mg Oral QHS  . montelukast  10 mg Oral Daily  . nitroGLYCERIN  0.1 mg Transdermal Daily  . pantoprazole  40 mg Oral Daily  . pentoxifylline  400 mg Oral TID WC    HPI: Christopher Fernandez is a 49 y.o. male readmitted for non healing wound following recent surgical amputations on R foot.   Underwent R #2 amputation 2/3 with Dr. Sharol Given for osteomyelitis. This wound failed to heal and underwent transmetatarsal amputation on 2/24 with placement of wound vac. This again required revision on 11/24/19.   He has cultured out serratia marcescens and MRSA in the past. He has been on Bactrim 1 DS BID for treatment from what we can see in outpatient records (30-day supply). During current hospitalization he has not been febrile and no leukocytosis. No fevers/chills at home. He has had multiple other rounds of  antibiotics (cipro, doxy, cefdinir) without any improvement. He is diabetic on oral regimens. Has had 30# weight loss over the last 3 months with planned medication changes and A1C down to ~8% per his report. He does smoke 10 cigs a day still and trying to cut down (35 yr smoking history).  He would like to be most aggressive to cure this infection in an effort to salvage his foot.    Review of Systems: Review of Systems  Constitutional: Negative for chills, fever, malaise/fatigue and weight loss.  HENT: Negative for sore throat.        No dental problems  Respiratory: Negative for cough and sputum production.   Cardiovascular: Negative for chest pain and leg swelling.  Gastrointestinal: Negative for abdominal pain, diarrhea and vomiting.  Genitourinary: Negative for dysuria and flank pain.  Musculoskeletal: Negative for joint pain, myalgias and neck pain.       R TMA surgical incision disruption/drainage   Skin: Negative for rash.  Neurological: Negative for dizziness, tingling and headaches.  Psychiatric/Behavioral: Negative for depression and substance abuse. The patient is not nervous/anxious and does not have insomnia.     Past Medical History:  Diagnosis Date  . Alcoholism ([place])   . Anemia   . Anxiety   . Arthritis   . Asthma   . COPD (chronic obstructive pulmonary disease) ([place])   . Depression   . Diabetes ([place])    type 2  . GERD (gastroesophageal reflux disease)   . History of hiatal hernia   . HTN (hypertension)   . 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 Pancreatitis   . Pneumonia   . Wears glasses     Social History   Tobacco Use  . Smoking status: Current Every Day Smoker    Packs/day: 0.50    Types: Cigarettes  . Smokeless tobacco: Never Used  Substance Use Topics  . Alcohol use: Not Currently    Comment: 2-3 per day, none since 2019  . Drug use: Not Currently    Family History  Problem Relation Age of Onset  . Diabetes Mother   . Hypertension  Mother   . Hyperlipidemia Mother   . Kidney disease Mother   . Thyroid disease Mother   . Breast cancer Mother        mets  . Lung cancer Mother   . Diabetes Father   . Alcohol abuse Sister   . Sickle cell trait Sister   . Diabetes Brother   . Asthma Brother   . Lung cancer Maternal Grandmother   . Kidney disease Maternal Grandmother   .  Liver cancer Maternal Uncle        x 4-5  . Kidney disease Maternal Uncle   . Kidney disease Paternal Uncle   . Colon cancer Neg Hx   . Colon polyps Neg Hx   . Esophageal cancer Neg Hx   . Rectal cancer Neg Hx   . Stomach cancer Neg Hx    Allergies  Allergen Reactions  . Eggs Or Egg-Derived Products Rash  . Morphine And Related Other (See Comments)    Cant take because of pancreatitis  . Cocoa Rash    OBJECTIVE: Blood pressure 112/82, pulse 75, temperature 97.6 F (36.4 C), temperature source Oral, resp. rate 19, height '6\' 3"'  (1.905 m), weight 117.9 kg, SpO2 100 %.  Physical Exam Constitutional:      Comments: Sitting comfortably in chair.   HENT:     Mouth/Throat:     Mouth: Mucous membranes are moist.     Pharynx: Oropharynx is clear. No oropharyngeal exudate.  Eyes:     General: No scleral icterus.    Pupils: Pupils are equal, round, and reactive to light.  Cardiovascular:     Rate and Rhythm: Normal rate and regular rhythm.     Heart sounds: No murmur.  Pulmonary:     Effort: Pulmonary effort is normal.     Breath sounds: Normal breath sounds.  Abdominal:     General: Bowel sounds are normal.     Palpations: Abdomen is soft.  Musculoskeletal:     Comments: R foot in CAM boot with wound vac in place. Serosanguinous drainage in canister   Skin:    Capillary Refill: Capillary refill takes less than 2 seconds.  Neurological:     Mental Status: He is alert.     Lab Results Lab Results  Component Value Date   WBC 6.6 01/11/2020   HGB 10.4 (L) 01/11/2020   HCT 33.5 (L) 01/11/2020   MCV 87.5 01/11/2020   PLT 311  01/11/2020    Lab Results  Component Value Date   CREATININE 1.05 01/11/2020   BUN 14 01/11/2020   NA 135 01/11/2020   K 4.7 01/11/2020   CL 102 01/11/2020   CO2 25 01/11/2020    Lab Results  Component Value Date   ALT 185 (H) 01/10/2020   AST 80 (H) 01/10/2020   ALKPHOS 111 01/10/2020   BILITOT 0.9 01/10/2020     Microbiology: Recent Results (from the past 240 hour(s))  SARS CORONAVIRUS 2 (TAT 6-24 HRS) Nasopharyngeal Nasopharyngeal Swab     Status: None   Collection Time: 01/09/20  1:43 PM   Specimen: Nasopharyngeal Swab  Result Value Ref Range Status   SARS Coronavirus 2 NEGATIVE NEGATIVE Final    Comment: (NOTE) SARS-CoV-2 target nucleic acids are NOT DETECTED. The SARS-CoV-2 RNA is generally detectable in upper and lower respiratory specimens during the acute phase of infection. Negative results do not preclude SARS-CoV-2 infection, do not rule out co-infections with other pathogens, and should not be used as the sole basis for treatment or other patient management decisions. Negative results must be combined with clinical observations, patient history, and epidemiological information. The expected result is Negative. Fact Sheet for Patients: SugarRoll.be Fact Sheet for Healthcare Providers: https://www.woods-mathews.com/ This test is not yet approved or cleared by the [place] FDA and  has been authorized for detection and/or diagnosis of SARS-CoV-2 by FDA under an Emergency Use Authorization (EUA). This EUA will remain  in effect (meaning this test can be used) for the  duration of the COVID-19 declaration under Section 56 4(b)(1) of the Act, 21 U.S.C. section 360bbb-3(b)(1), unless the authorization is terminated or revoked sooner. Performed at [facility], [place] [address], [place], [place] [address]   Aerobic/Anaerobic Culture (surgical/deep wound)     Status: None (Preliminary result)   Collection Time:  01/10/20  1:12 PM   Specimen: PATH Amputaion Arm/Leg; Tissue  Result Value Ref Range Status   Specimen Description WOUND  Final   Special Requests OPEN RIGHT TRANSMETATARSAL AMPUTATION SITE  Final   Gram Stain   Final    RARE WBC PRESENT, PREDOMINANTLY PMN NO ORGANISMS SEEN    Culture   Final    CULTURE REINCUBATED FOR BETTER GROWTH Performed at [facility], [place] [address], [place], [place] [address]    Report Status PENDING  Incomplete     Janene Madeira, MSN, NP-C [facility] for Infectious Disease [email or web address] Pager: [phone] Office: [place]: [phone]    01/11/2020 10:42 AM

## 2020-01-11 NOTE — Progress Notes (Signed)
PHARMACY NOTE:  ANTIMICROBIAL RENAL DOSAGE ADJUSTMENT  Current antimicrobial regimen includes a mismatch between antimicrobial dosage and estimated renal function.  As per policy approved by the Pharmacy & Therapeutics and Medical Executive Committees, the antimicrobial dosage will be adjusted accordingly.  Current antimicrobial dosage:  Cefepime 1 gm q 12   Indication: Osteomyelitis of right foot  Renal Function:  Estimated Creatinine Clearance: 115.3 mL/min (by C-G formula based on SCr of 1.05 mg/dL). []      On intermittent HD, scheduled: []      On CRRT    Antimicrobial dosage has been changed to:  Cefepime 2 gm q 8 hours  Additional comments: Increase dose to optimize osteomyelitis therapy   Thank you for allowing pharmacy to be a part of this patient's care.  Jimmy Footman, PharmD, BCPS, [place] Infectious Diseases Clinical Pharmacist Phone: [phone] 01/11/2020 10:28 AM

## 2020-01-11 NOTE — Evaluation (Signed)
Physical Therapy Evaluation Patient Details Name: Christopher Fernandez MRN: [id number] DOB: [birth year] Today's Date: 01/11/2020   History of Present Illness  Patient is a 49 year old male who initially underwent right second toe amputation on 10/18/2019 for osteomyelitis with orthopedic surgery.  Ultimately failed to heal and he then had a right transmetatarsal amputation on 11/07/2018.  The TMA also failed to heal and required revision on 11/24/2019.  He presents  for planned debridement and VAC placement  Clinical Impression  Pt admitted with/for revision of the failed R foot amputations.  Pt needing supervision for basic mobility at this time..  Pt currently limited functionally due to the problems listed below.  (see problems list.)  Pt will benefit from PT to maximize function and safety to be able to get home safely with limited available assist from his family.    Follow Up Recommendations No PT follow up    Equipment Recommendations  Rolling walker with 5" wheels    Recommendations for Other Services       Precautions / Restrictions Precautions Precautions: Other (comment) Precaution Comments: NWB R LE Required Braces or Orthoses: Other Brace Other Brace: R foot amputation shoe Restrictions Weight Bearing Restrictions: Yes RLE Weight Bearing: Non weight bearing Other Position/Activity Restrictions: R LE      Mobility  Bed Mobility Overal bed mobility: Modified Independent             General bed mobility comments: pt in recliner upon arrival  Transfers Overall transfer level: Needs assistance Equipment used: Rolling walker (2 wheeled) Transfers: Sit to/from [facility] Sit to Stand: Supervision Stand pivot transfers: Supervision       General transfer comment: sup - Mod I  Ambulation/Gait Ambulation/Gait assistance: Supervision Gait Distance (Feet): 30 Feet Assistive device: Rolling walker (2 wheeled) Gait Pattern/deviations: Step-to pattern Gait  velocity: slower Gait velocity interpretation: <1.8 ft/sec, indicate of risk for recurrent falls General Gait Details: pt used RW well with a "swing to" pattern.  Smooth and steady.  Stairs            Wheelchair Mobility    Modified Rankin (Stroke Patients Only)       Balance Overall balance assessment: Mild deficits observed, not formally tested                                           Pertinent Vitals/Pain      Home Living Family/patient expects to be discharged to:: Private residence Living Arrangements: Spouse/significant other Available Help at Discharge: Family Type of Home: House Home Access: Level entry     Home Layout: One level Home Equipment: None      Prior Function Level of Independence: Independent               Hand Dominance        Extremity/Trunk Assessment   Upper Extremity Assessment Upper Extremity Assessment: Overall WFL for tasks assessed    Lower Extremity Assessment Lower Extremity Assessment: Overall WFL for tasks assessed    Cervical / Trunk Assessment Cervical / Trunk Assessment: Normal  Communication   Communication: No difficulties  Cognition Arousal/Alertness: Awake/alert Behavior During Therapy: WFL for tasks assessed/performed Overall Cognitive Status: Within Functional Limits for tasks assessed  General Comments      Exercises     Assessment/Plan    PT Assessment Patient needs continued PT services  PT Problem List Decreased activity tolerance;Decreased balance;Decreased mobility;Decreased knowledge of use of DME;Decreased knowledge of precautions       PT Treatment Interventions DME instruction;Gait training;Functional mobility training;Therapeutic activities;Patient/family education;Balance training    PT Goals (Current goals can be found in the Care Plan section)  Acute Rehab PT Goals Patient Stated Goal: go home PT Goal  Formulation: With patient Time For Goal Achievement: 01/18/20 Potential to Achieve Goals: Good    Frequency Min 3X/week   Barriers to discharge        Co-evaluation               AM-PAC PT "6 Clicks" Mobility  Outcome Measure Help needed turning from your back to your side while in a flat bed without using bedrails?: None Help needed moving from lying on your back to sitting on the side of a flat bed without using bedrails?: None Help needed moving to and from a bed to a chair (including a wheelchair)?: A Little Help needed standing up from a chair using your arms (e.g., wheelchair or bedside chair)?: A Little Help needed to walk in hospital room?: A Little Help needed climbing 3-5 steps with a railing? : A Lot 6 Click Score: 19    End of Session   Activity Tolerance: Patient tolerated treatment well Patient left: in bed;with call bell/phone within reach Nurse Communication: Mobility status PT Visit Diagnosis: Unsteadiness on feet (R26.81);Other abnormalities of gait and mobility (R26.89);Difficulty in walking, not elsewhere classified (R26.2)    Time: 1500-1536 PT Time Calculation (min) (ACUTE ONLY): 36 min   Charges:   PT Evaluation $PT Eval Moderate Complexity: 1 Mod PT Treatments $Gait Training: 8-22 mins        01/11/2020  Ginger Carne., PT Acute Rehabilitation Services [phone]  (pager) [phone]  (office)  Tessie Fass Clell Trahan 01/11/2020, 5:04 PM

## 2020-01-11 NOTE — Plan of Care (Deleted)
  Problem: Acute Rehab PT Goals(only PT should resolve) Goal: Pt Will Ambulate Outcome: Not Applicable

## 2020-01-11 NOTE — Progress Notes (Addendum)
[facility]  Subjective  - Doing well over all worried about infection.   Objective 105/73 69 (!) 97.4 F (36.3 C) (Oral) 15 100%  Intake/Output Summary (Last 24 hours) at 01/11/2020 0721 Last data filed at 01/11/2020 0600 Gross per 24 hour  Intake 3054.61 ml  Output 1555 ml  Net 1499.61 ml    Doppler signals right peroneal, PT/DP Wound vac with good suction right TMA Lungs non labored breathing Heart RRR   Assessment/Planning: POD # 1 TMA debridement with wound vac placed   Pending discharge plan Culture no growth to date   Roxy Horseman 01/11/2020 7:21 AM --  Laboratory Lab Results: Recent Labs    01/10/20 1053 01/11/20 0230  WBC 5.9 6.6  HGB 12.4* 10.4*  HCT 39.7 33.5*  PLT 377 311   BMET Recent Labs    01/10/20 1053 01/11/20 0230  NA 136 135  K 5.1 4.7  CL 103 102  CO2 22 25  GLUCOSE 163* 230*  BUN 12 14  CREATININE 1.20 1.05  CALCIUM 9.1 8.5*    COAG Lab Results  Component Value Date   INR 1.0 01/10/2020   INR 1.0 06/14/2019   No results found for: PTT  I have seen and evaluated the patient. I agree with the PA note as documented above.  Postop day 1 status post debridement of right TMA.  VAC with good seal.  Pain well controlled.  He has palpable pedal pulses.  He had outpatient appointment with ID and I will ask ID to see him while he is here in the hospital.  Previous cultures grew MRSA and Serratia from March.  OR cultures from yesterday are not growing anything.  Appreciate insight from ID regarding long-term antibiotic management since this wound has been nonhealing.  Will order home vac.  [name], MD [facility] Office: [phone]

## 2020-01-11 NOTE — Progress Notes (Signed)
Peripherally Inserted Central Catheter Placement  The IV Nurse has discussed with the patient and/or persons authorized to consent for the patient, the purpose of this procedure and the potential benefits and risks involved with this procedure.  The benefits include less needle sticks, lab draws from the catheter, and the patient may be discharged home with the catheter. Risks include, but not limited to, infection, bleeding, blood clot (thrombus formation), and puncture of an artery; nerve damage and irregular heartbeat and possibility to perform a PICC exchange if needed/ordered by physician.  Alternatives to this procedure were also discussed.  Bard Power PICC patient education guide, fact sheet on infection prevention and patient information card has been provided to patient /or left at bedside.    PICC Placement Documentation  PICC Single Lumen 01/11/20 PICC Right Brachial 41 cm 0 cm (Active)  Indication for Insertion or Continuance of Line Home intravenous therapies (PICC only) 01/11/20 1208  Exposed Catheter (cm) 0 cm 01/11/20 1208  Site Assessment Clean;Dry;Intact 01/11/20 1208  Line Status Flushed;Saline locked;Blood return noted 01/11/20 1208  Dressing Type Transparent;Securing device 01/11/20 1208  Dressing Status Clean;Dry;Intact;Antimicrobial disc in place 01/11/20 1208  Dressing Intervention New dressing 01/11/20 1208  Dressing Change Due 01/18/20 01/11/20 Christopher Fernandez 01/11/2020, 12:26 PM

## 2020-01-12 DIAGNOSIS — A4902 Methicillin resistant Staphylococcus aureus infection, unspecified site: Secondary | ICD-10-CM

## 2020-01-12 DIAGNOSIS — A498 Other bacterial infections of unspecified site: Secondary | ICD-10-CM

## 2020-01-12 DIAGNOSIS — S98319A Complete traumatic amputation of unspecified midfoot, initial encounter: Secondary | ICD-10-CM

## 2020-01-12 LAB — BASIC METABOLIC PANEL
Anion gap: 6 (ref 5–15)
BUN: 9 mg/dL (ref 6–20)
CO2: 25 mmol/L (ref 22–32)
Calcium: 8.6 mg/dL — ABNORMAL LOW (ref 8.9–10.3)
Chloride: 102 mmol/L (ref 98–111)
Creatinine, Ser: 0.79 mg/dL (ref 0.61–1.24)
GFR calc Af Amer: >60 mL/min (ref >60)
GFR calc non Af Amer: >60 mL/min (ref >60)
Glucose, Bld: 258 mg/dL — ABNORMAL HIGH (ref 70–99)
Potassium: 5 mmol/L (ref 3.5–5.1)
Sodium: 133 mmol/L — ABNORMAL LOW (ref 135–145)

## 2020-01-12 LAB — CBC
HCT: 34.3 % — ABNORMAL LOW (ref 39.0–52.0)
Hemoglobin: 10.5 g/dL — ABNORMAL LOW (ref 13.0–17.0)
MCH: 26.9 pg (ref 26.0–34.0)
MCHC: 30.6 g/dL (ref 30.0–36.0)
MCV: 87.7 fL (ref 80.0–100.0)
Platelets: 299 10*3/uL (ref 150–400)
RBC: 3.91 MIL/uL — ABNORMAL LOW (ref 4.22–5.81)
RDW: 14 % (ref 11.5–15.5)
WBC: 6.2 10*3/uL (ref 4.0–10.5)
nRBC: 0 % (ref 0.0–0.2)

## 2020-01-12 LAB — C-REACTIVE PROTEIN: CRP: 0.6 mg/dL (ref <1.0)

## 2020-01-12 LAB — SEDIMENTATION RATE: Sed Rate: 35 mm/hr — ABNORMAL HIGH (ref 0–16)

## 2020-01-12 LAB — GLUCOSE, CAPILLARY: Glucose-Capillary: 150 mg/dL — ABNORMAL HIGH (ref 70–99)

## 2020-01-12 MED ORDER — CEFEPIME IV (FOR PTA / DISCHARGE USE ONLY): 2.0000 g | Freq: Three times a day (TID) | INTRAVENOUS | 0 refills | Status: AC

## 2020-01-12 MED ORDER — VANCOMYCIN IV (FOR PTA / DISCHARGE USE ONLY): 1500.0000 mg | Freq: Two times a day (BID) | INTRAVENOUS | 0 refills | Status: AC

## 2020-01-12 MED ORDER — VANCOMYCIN HCL 1500 MG/300ML IV SOLN: 1500.0000 mg | Freq: Two times a day (BID) | INTRAVENOUS | 1 refills | Status: AC

## 2020-01-12 MED ORDER — METRONIDAZOLE 500 MG PO TABS
500.0000 mg | ORAL_TABLET | Freq: Three times a day (TID) | ORAL | Status: DC
  Administered 2020-01-12 – 2020-01-13 (×3): 500 mg via ORAL
  Filled 2020-01-12 (×3): qty 1

## 2020-01-12 NOTE — Progress Notes (Signed)
Noted that patient has order to use his own CGM for blood sugars. Noted that lab glucose this am at 0743 was 258 mg/dl and the CGM charted as 94 mg/dl. Spoke with staff RN about blood sugars. Noted by staff RN that the CGM reading was before breakfast and the lab was a postprandial reading. Recommend checking a CBG with fingerstick before lunch today and then have patient check CGM reading at the same time.   Harvel Ricks RN BSN CDE Diabetes Coordinator Pager: [phone]  8am-5pm

## 2020-01-12 NOTE — TOC Progression Note (Signed)
Transition of Care (TOC) - Progression Note  Marvetta Gibbons RN, BSN Transitions of Care Unit 4E- RN Case Manager [phone]   Patient Details  Name: Christopher Fernandez MRN: [id number] Date of Birth: 17-Jun-1971  Transition of Care [facility]) CM/SW Contact  [name], [name], RN Phone Number: 01/12/2020, 2:54 PM  Clinical Narrative:    Continued to reach out to [facility] agencies today for [facility] needs- calls made to:  [facility]- not in [place], commercial- not in [place]- not in [place]- not in [place]- only do private pay [employer]- not in network Call made back to [facility]- still unable to provide nursing due to staffing Call made back to [place]- per Tommi Rumps they can accept referral with start of care Tues May 4- spoke with Elliot Dally. Vascular PA regarding start of care date- this is the only agency that has been able to say they can provide needed services- discussed with Katharine Look wound VAC drsg change done today- and if pt would need drsg change again tomorrow prior to discharge or if ok to wait until Tues with HH start of care- she will discuss with Dr. Carlis Abbott on rounds in the AM. Also spoke with Jackelyn Poling at [place] regarding [facility] agency and start of care with [facility]. Wife is a Marine scientist and has done bedside teaching with Jeannene Patella, wife is comfortable with home IV infusion- HH RN to f/u when in the home tues. Pt will need to get IV abx afternoon doses here on Saturday prior to discharge and wife will plan to do evening/night doses at home.   Call made to [facility] with [employer] for DME need - RW to be delivered to the bedside  [facility] home wound VAC has been approved and delivered to the bedside for transition home- bedside RN will transition pt to home [facility] prior to discharge.   Expected Discharge Plan: [place] Barriers to Discharge: Continued Medical Work up  Expected Discharge Plan and Services Expected Discharge Plan: [place]    Discharge Planning Services: CM Consult Post Acute Care Choice: Durable Medical Equipment, Home Health Living arrangements for the past 2 months: Apartment                 DME Arranged: Walker rolling DME Agency: [employer] Date DME Agency Contacted: 01/12/20 Time DME Agency Contacted: 67 Representative spoke with at DME Agency: Thedore Mins HH Arranged: RN, IV Antibiotics [place]: [place] Date [place]: 01/12/20 Time [place]: 1300 Representative spoke with at [place]: [place] ([place]) Interventions    Readmission Risk Interventions No flowsheet data found.

## 2020-01-12 NOTE — Progress Notes (Signed)
Subjective: No new complaints   Antibiotics:  Anti-infectives (From admission, onward)   Start     Dose/Rate Route Frequency Ordered Stop   01/12/20 0000  ceFEPime (MAXIPIME) IVPB     2 g Intravenous Every 8 hours 01/12/20 1216 02/23/20 2359   01/12/20 0000  vancomycin IVPB     1,500 mg Intravenous Every 12 hours 01/12/20 1216 02/23/20 2359   01/12/20 0000  vancomycin HCl (VANCOREADY) 1500 MG/300ML SOLN     1,500 mg Intravenous Every 12 hours 01/12/20 1216 02/22/20 2359   01/11/20 1100  ceFEPIme (MAXIPIME) 2 g in sodium chloride 0.9 % 100 mL IVPB     2 g 200 mL/hr over 30 Minutes Intravenous Every 8 hours 01/11/20 1025     01/10/20 1630  ceFEPIme (MAXIPIME) 1 g in sodium chloride 0.9 % 100 mL IVPB  Status:  Discontinued     1 g 200 mL/hr over 30 Minutes Intravenous Every 12 hours 01/10/20 1419 01/11/20 1025   01/10/20 1615  vancomycin (VANCOREADY) IVPB 1500 mg/300 mL     1,500 mg 150 mL/hr over 120 Minutes Intravenous Every 12 hours 01/10/20 1604     01/10/20 1600  ceFAZolin (ANCEF) IVPB 2g/100 mL premix  Status:  Discontinued     2 g 200 mL/hr over 30 Minutes Intravenous Every 8 hours 01/10/20 1550 01/10/20 1615   01/10/20 1020  ceFAZolin (ANCEF) IVPB 2g/100 mL premix     2 g 200 mL/hr over 30 Minutes Intravenous 30 min pre-op 01/10/20 1020 01/10/20 1303      Medications: Scheduled Meds: . ARIPiprazole  5 mg Oral Daily  . atorvastatin  40 mg Oral Daily  . Chlorhexidine Gluconate Cloth  6 each Topical Daily  . docusate sodium  100 mg Oral Daily  . DULoxetine  60 mg Oral Daily  . gabapentin  300 mg Oral TID  . heparin injection (subcutaneous)  5,000 Units Subcutaneous Q8H  . insulin aspart  0-15 Units Subcutaneous TID WC  . insulin glargine  20 Units Subcutaneous Daily  . metFORMIN  1,000 mg Oral BID WC  . mirtazapine  30 mg Oral QHS  . montelukast  10 mg Oral Daily  . nitroGLYCERIN  0.1 mg Transdermal Daily  . pantoprazole  40 mg Oral Daily  .  pentoxifylline  400 mg Oral TID WC   Continuous Infusions: . ceFEPime (MAXIPIME) IV 2 g (01/12/20 1216)  . magnesium sulfate bolus IVPB    . vancomycin 1,500 mg (01/12/20 0512)   PRN Meds:.acetaminophen **OR** acetaminophen, albuterol, alum & mag hydroxide-simeth, bisacodyl, diphenhydrAMINE, guaiFENesin-dextromethorphan, hydrALAZINE, labetalol, magnesium sulfate bolus IVPB, metoprolol tartrate, ondansetron, oxyCODONE-acetaminophen, phenol, polyethylene glycol, potassium chloride, simethicone, sodium chloride flush    Objective: Weight change:   Intake/Output Summary (Last 24 hours) at 01/12/2020 1538 Last data filed at 01/12/2020 0824 Gross per 24 hour  Intake 1505.64 ml  Output 2600 ml  Net -1094.36 ml   Blood pressure 123/83, pulse 66, temperature 98.4 F (36.9 C), temperature source Oral, resp. rate 16, height 6\' 3"  (1.905 m), weight 117.9 kg, SpO2 96 %. Temp:  [97.3 F (36.3 C)-98.4 F (36.9 C)] 98.4 F (36.9 C) (04/30 1154) Pulse Rate:  [65-74] 66 (04/30 1154) Resp:  [11-18] 16 (04/30 1154) BP: (102-123)/(70-85) 123/83 (04/30 1154) SpO2:  [96 %-99 %] 96 % (04/30 1154)  Physical Exam: General: Alert and awake, oriented x3, not in any acute distress. HEENT: anicteric sclera, EOMI CVS regular rate, normal  Chest: ,  no wheezing, no respiratory distress Abdomen: soft non-distended,   PICC line Neuro: nonfocal  CBC:    BMET Recent Labs    01/11/20 0230 01/12/20 0743  NA 135 133*  K 4.7 5.0  CL 102 102  CO2 25 25  GLUCOSE 230* 258*  BUN 14 9  CREATININE 1.05 0.79  CALCIUM 8.5* 8.6*     Liver Panel  Recent Labs    01/10/20 1053  PROT 7.3  ALBUMIN 3.8  AST 80*  ALT 185*  ALKPHOS 111  BILITOT 0.9       Sedimentation Rate Recent Labs    01/12/20 0743  ESRSEDRATE 35*   C-Reactive Protein Recent Labs    01/12/20 0743  CRP 0.6    Micro Results: Recent Results (from the past 720 hour(s))  Wound culture     Status: Abnormal   Collection  Time: 12/19/19 10:48 AM   Specimen: Wound  Result Value Ref Range Status   MICRO NUMBER: [id number]  Final   SPECIMEN QUALITY: Adequate  Final   SOURCE: FOOT, RIGHT  Final   STATUS: FINAL  Final   GRAM STAIN:   Final    No white blood cells seen No epithelial cells seen Many Gram positive cocci in clusters Many Gram positive bacilli Many Gram negative bacilli   ISOLATE 1: methicillin resistant Staphylococcus aureus (A)  Final    Comment: Heavy growth of Methicillin resistant Staphylococcus aureus (MRSA) Negative for inducible clindamycin resistance.      Susceptibility   Methicillin resistant staphylococcus aureus - AEROBIC CULT, GRAM STAIN POSITIVE 1    VANCOMYCIN 1 Sensitive     CIPROFLOXACIN <=0.5 Sensitive     CLINDAMYCIN <=0.25 Sensitive     LEVOFLOXACIN <=0.12 Sensitive     ERYTHROMYCIN >=8 Resistant     GENTAMICIN <=0.5 Sensitive     OXACILLIN* NR Resistant      * Oxacillin-resistant staphylococci are resistant toall currently available beta-lactam antimicrobialagents including penicillins, beta lactam/beta-lactamase inhibitor combinations, and cephems withstaphylococcal indications, including Cefazolin.    TETRACYCLINE <=1 Sensitive     TRIMETH/SULFA* <=10 Sensitive      * Oxacillin-resistant staphylococci are resistant toall currently available beta-lactam antimicrobialagents including penicillins, beta lactam/beta-lactamase inhibitor combinations, and cephems withstaphylococcal indications, including Cefazolin.Legend:S = Susceptible  I = IntermediateR = Resistant  NS = Not susceptible* = Not tested  NR = Not reported**NN = See antimicrobic comments  SARS CORONAVIRUS 2 (TAT 6-24 HRS) Nasopharyngeal Nasopharyngeal Swab     Status: None   Collection Time: 01/09/20  1:43 PM   Specimen: Nasopharyngeal Swab  Result Value Ref Range Status   SARS Coronavirus 2 NEGATIVE NEGATIVE Final    Comment: (NOTE) SARS-CoV-2 target nucleic acids are NOT DETECTED. The SARS-CoV-2 RNA is generally  detectable in upper and lower respiratory specimens during the acute phase of infection. Negative results do not preclude SARS-CoV-2 infection, do not rule out co-infections with other pathogens, and should not be used as the sole basis for treatment or other patient management decisions. Negative results must be combined with clinical observations, patient history, and epidemiological information. The expected result is Negative. Fact Sheet for Patients: SugarRoll.be Fact Sheet for Healthcare Providers: https://www.woods-mathews.com/ This test is not yet approved or cleared by the [place] FDA and  has been authorized for detection and/or diagnosis of SARS-CoV-2 by FDA under an Emergency Use Authorization (EUA). This EUA will remain  in effect (meaning this test can be used) for the duration of the COVID-19 declaration under Section 56  4(b)(1) of the Act, 21 U.S.C. section 360bbb-3(b)(1), unless the authorization is terminated or revoked sooner. Performed at [facility], [place] [address] [place], [place] [address]   Aerobic/Anaerobic Culture (surgical/deep wound)     Status: None (Preliminary result)   Collection Time: 01/10/20  1:12 PM   Specimen: PATH Amputaion Arm/Leg; Tissue  Result Value Ref Range Status   Specimen Description WOUND  Final   Special Requests OPEN RIGHT TRANSMETATARSAL AMPUTATION SITE  Final   Gram Stain   Final    RARE WBC PRESENT, PREDOMINANTLY PMN NO ORGANISMS SEEN    Culture   Final    FEW DIPHTHEROIDS(CORYNEBACTERIUM SPECIES) Standardized susceptibility testing for this organism is not available. HOLDING FOR POSSIBLE ANAEROBE Performed at [facility], [place] [address], [place], [place] [address]    Report Status PENDING  Incomplete    Studies/Results: [place] EKG SITE RITE  Result Date: 01/11/2020 If Site Rite image not attached, placement could not be confirmed due to current cardiac  rhythm.     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 male with  non-healing wound of recent R TMA. He has cultured out MRSA and serratia consistently in the past and has been most recently on Bactrim x 30d. Intraoperative cultures  Yielded diptheroid and possible anerobe  --will continue cefepime and vancomycin and adding oral flagyl to cover for anerobes  NOte please send flagyl 500mg  tid to his pharmacy as this will be an oral med for his osteo  [facility] arranged     LOS: 2 days   Christopher Fernandez 01/12/2020, 3:38 PM

## 2020-01-12 NOTE — Progress Notes (Signed)
Checked finger stick CBG to compare to patient's continuous glucose monitor.  Finger stick CBG 150, CGM 135.

## 2020-01-12 NOTE — Progress Notes (Signed)
Physical Therapy Treatment Patient Details Name: Christopher Fernandez MRN: [id number] DOB: 08/16/1971 Today's Date: 01/12/2020    History of Present Illness Patient is a 49 year old male who initially underwent right second toe amputation on 10/18/2019 for osteomyelitis with orthopedic surgery.  Ultimately failed to heal and he then had a right transmetatarsal amputation on 11/07/2018.  The TMA also failed to heal and required revision on 11/24/2019.  He presents  for planned debridement and VAC placement    PT Comments    Patient progressing with safety with walker and with distance with ambulation while still keeping RLE NWB.   Feel  He has met PT goals and reports hopeful for d/c home tomorrow.  Will sign off.  Follow Up Recommendations  No PT follow up     Equipment Recommendations  Rolling walker with 5" wheels    Recommendations for Other Services       Precautions / Restrictions Precautions Precautions: Fall Precaution Comments: NWB R LE (per PT/OT notes, no orders found for weight bearing) Required Braces or Orthoses: Other Brace Other Brace: R foot amputation shoe    Mobility  Bed Mobility Overal bed mobility: Modified Independent                Transfers Overall transfer level: Modified independent Equipment used: None Transfers: Sit to/from Stand Sit to Stand: Supervision         General transfer comment: stood on his own with R foot on floor in heel wedge Darco  Ambulation/Gait Ambulation/Gait assistance: Supervision Gait Distance (Feet): 120 Feet Assistive device: Rolling walker (2 wheeled) Gait Pattern/deviations: Step-to pattern     General Gait Details: keeping weight off R foot with ambulation, cues for stopping walker prior to stepping with L foot for safety   Stairs             Wheelchair Mobility    Modified Rankin (Stroke Patients Only)       Balance Overall balance assessment: Needs assistance   Sitting balance-Leahy Scale:  Good       Standing balance-Leahy Scale: Fair Standing balance comment: with R foot on the floor steady without UE support, RW for ambulation                            Cognition Arousal/Alertness: Awake/alert Behavior During Therapy: WFL for tasks assessed/performed Overall Cognitive Status: Within Functional Limits for tasks assessed                                        Exercises      General Comments        Pertinent Vitals/Pain Pain Assessment: No/denies pain    Home Living                      Prior Function            PT Goals (current goals can now be found in the care plan section) Progress towards PT goals: Progressing toward goals;Goals met/education completed, patient discharged from PT    Frequency    Min 3X/week      PT Plan Current plan remains appropriate    Co-evaluation              AM-PAC PT "6 Clicks" Mobility   Outcome Measure  Help needed turning from your back to your side  while in a flat bed without using bedrails?: None Help needed moving from lying on your back to sitting on the side of a flat bed without using bedrails?: None Help needed moving to and from a bed to a chair (including a wheelchair)?: None Help needed standing up from a chair using your arms (e.g., wheelchair or bedside chair)?: None Help needed to walk in hospital room?: None Help needed climbing 3-5 steps with a railing? : A Little 6 Click Score: 23    End of Session Equipment Utilized During Treatment: Gait belt Activity Tolerance: Patient tolerated treatment well Patient left: in bed;with call bell/phone within reach Nurse Communication: Mobility status PT Visit Diagnosis: Unsteadiness on feet (R26.81);Other abnormalities of gait and mobility (R26.89);Difficulty in walking, not elsewhere classified (R26.2)     Time: 9373-7496 PT Time Calculation (min) (ACUTE ONLY): 17 min  Charges:  $Gait Training: 8-22  mins                     Magda Kiel, [place] [phone] 01/12/2020    Reginia Naas 01/12/2020, 4:17 PM

## 2020-01-12 NOTE — Progress Notes (Signed)
Right lower extremity wound VAC changed at the bedside with Dr. Carlis Abbott.  The patient's wound is clean with appearance of good early granulation tissue.  There is a faint foul odor present, however there are no signs of infection or purulence noted.  Wound VAC replaced with good seal.

## 2020-01-12 NOTE — Progress Notes (Signed)
[facility]  Subjective  - no acute events.  Got PICC line.   Objective 119/85 69 98.3 F (36.8 C) (Oral) 15 98%  Intake/Output Summary (Last 24 hours) at 01/12/2020 0803 Last data filed at 01/12/2020 0600 Gross per 24 hour  Intake 1145.64 ml  Output 2000 ml  Net -854.36 ml    Right TMA with vac    Assessment/Planning:  Postop day 2 status post debridement of right TMA.  We will change VAC today.  He had a PICC line placed yesterday and appreciate ID input.  Plan for 6 weeks of IV antibiotics with Vanc and cefepime.  Discussed plan for discharge home with home health and VAC changes 3 times a week.  Does not have a ride home until tomorrow.  [name], MD [facility] Office: Welcome 01/12/2020 8:03 AM --

## 2020-01-12 NOTE — Consult Note (Signed)
PV Navigator Consult received and chart reviewed.  I was my pleasure to speak with Christopher Fernandez via telephone and introduce myself and my role as navigator. Patient confirms that he is scheduled for discharge home tomorrow with his wife, who is a Marine scientist, and is awaiting in network [facility] agency acceptance for IV abx and wound vac dressing change services. He has also agreed to [facility] case management for follow up due to chronic disease.  He denies any additional questions or barriers to care at this time. Encouraged pt to reach out should questions or barriers arise and I was able to text patient my contact information for him to store in his phone should he need it. I will follow up post discharge.  Thank you for this opportunity to work with this patient.  Cletis Media RN BSN CWS [place] [phone]

## 2020-01-13 DIAGNOSIS — L02611 Cutaneous abscess of right foot: Secondary | ICD-10-CM | POA: Diagnosis not present

## 2020-01-13 DIAGNOSIS — S98011A Complete traumatic amputation of right foot at ankle level, initial encounter: Secondary | ICD-10-CM | POA: Diagnosis not present

## 2020-01-13 LAB — AEROBIC/ANAEROBIC CULTURE W GRAM STAIN (SURGICAL/DEEP WOUND)

## 2020-01-13 LAB — GLUCOSE, CAPILLARY
Glucose-Capillary: 98 mg/dL (ref 70–99)
Glucose-Capillary: 271 mg/dL — ABNORMAL HIGH (ref 70–99)

## 2020-01-13 LAB — VANCOMYCIN, PEAK: Vancomycin Pk: 26 ug/mL — ABNORMAL LOW (ref 30–40)

## 2020-01-13 LAB — VANCOMYCIN, TROUGH: Vancomycin Tr: 9 ug/mL — ABNORMAL LOW (ref 15–20)

## 2020-01-13 MED ORDER — METRONIDAZOLE 500 MG PO TABS: 500.0000 mg | ORAL_TABLET | Freq: Three times a day (TID) | ORAL | 0 refills | Status: DC

## 2020-01-13 MED ORDER — OXYCODONE-ACETAMINOPHEN 5-325 MG PO TABS: 1.0000 | ORAL_TABLET | Freq: Four times a day (QID) | ORAL | 0 refills | Status: DC | PRN

## 2020-01-13 MED ORDER — HEPARIN SOD (PORK) LOCK FLUSH 100 UNIT/ML IV SOLN
250.0000 [IU] | INTRAVENOUS | Status: AC | PRN
  Administered 2020-01-13: 250 [IU]
  Filled 2020-01-13: qty 2.5

## 2020-01-13 MED FILL — METRONIDAZOLE 500 MG TABS: 500 | 42 days supply | Qty: 126 | Fill #0

## 2020-01-13 MED FILL — OXYCODONE-APAP 5-325MG: 5-325 | 5 days supply | Qty: 20 | Fill #0

## 2020-01-13 NOTE — Progress Notes (Signed)
Pharmacy Antibiotic Note  Christopher Fernandez is a 49 y.o. male admitted on 01/10/2020 with wound infection/osteomyelitis.  Pharmacy has been consulted for Vancomycin dosing.  VP was 26 drawn slightly late at 0929, VT was drawn on previous dosing interval. VT was 9. Vancomycin is at steady state. Calculated AUC is within goal at 454.8. Renal function remains stable.    Plan: Start Vancomycin 1500 mg IV q12hr Continue cefepime per MD Continue flagyl per MD Monitor renal function, C&S and vanc levels as needed  Height: 6\' 3"  (190.5 cm) Weight: 117.9 kg (260 lb) IBW/kg (Calculated) : 84.5  Temp (24hrs), Avg:98 F (36.7 C), Min:97.4 F (36.3 C), Max:98.6 F (37 C)  Recent Labs  Lab 01/10/20 1053 01/11/20 0230 01/12/20 0743 01/13/20 0430 01/13/20 0929  WBC 5.9 6.6 6.2  --   --   CREATININE 1.20 1.05 0.79  --   --   VANCOTROUGH  --   --   --  9*  --   VANCOPEAK  --   --   --   --  26*    Estimated Creatinine Clearance: 151.3 mL/min (by C-G formula based on SCr of 0.79 mg/dL).    Allergies  Allergen Reactions  . Eggs Or Egg-Derived Products Rash  . Morphine And Related Other (See Comments)    Cant take because of pancreatitis  . Cocoa Rash    Antimicrobials this admission: Vanc 4/28 >>  cefepime 4/29>> Flagyl 4/30 >>  Thank you for allowing pharmacy to be a part of this patient's care.  Sherren Kerns, PharmD PGY1 Acute Care Pharmacy Resident Please see AMION for all Pharmacists' Contact Phone Numbers 01/13/2020, 11:02 AM

## 2020-01-13 NOTE — Discharge Summary (Addendum)
Discharge Summary  Patient ID: Christopher Fernandez [id number] 49 y.o. 24-Sep-1970  Admit date: 01/10/2020  Discharge date and time: 01/13/2020  Admitting Physician: [name], MD   Discharge Physician: [name], MD  Admission Diagnoses: Amputation at midfoot, right, sequela [facility]) [S98.311S]  Discharge Diagnoses: poor healing of right TMA site; prior MRSA and serratia positive right wound cultures  Admission Condition: good  Discharged Condition: good  Indication for Admission: Poor healing of right transmetatarsal amputation site with prior positive wound cultures growing MRSA and Serratia species  Hospital Course: On the day of admission, the patient was taken to the operating room where he underwent debridement of his right TMA site.  Negative pressure wound VAC dressing was applied.  He tolerated procedure well.  Intraoperative wound cultures were obtained.  Intravenous antibiotics were initiated.  Infectious disease consultation was obtained and due to his previous positive wound cultures, IV antibiotics for 6 weeks was recommended.  A PICC line was placed.  His wound VAC was changed and the wound revealed early granulation tissue.  He remained hemodynamically stable and afebrile.  Pain well controlled.  A previous Darco shoe had been obtained and he used this to ambulate with heel bearing only.  Consults: ID  Treatments: antibiotics: vancomycin, cefepime.  Intraoperative debridement  Discharge Exam:  Vitals:   01/13/20 0332 01/13/20 0840  BP: 125/81 120/88  Pulse: 62 75  Resp: 12 16  Temp: 97.7 F (36.5 C) 98.6 F (37 C)  SpO2: 90% 93%   Cardiac: Rate rhythm regular Lungs: Clear to auscultation bilaterally Extremities: Right foot warm and well-perfused.  Wound VAC in place with good seal Abdomen: Benign Neurologic: Alert and oriented x4.  No deficits   Disposition: Discharge disposition: 06-Home-Health Care Svc       Patient Instructions:    Allergies as of 01/13/2020      Reactions   Eggs Or Egg-derived Products Rash   Morphine And Related Other (See Comments)   Cant take because of pancreatitis   Cocoa Rash      Medication List    STOP taking these medications   sulfamethoxazole-trimethoprim 800-160 MG tablet Commonly known as: Bactrim DS     TAKE these medications   albuterol 108 (90 Base) MCG/ACT inhaler Commonly known as: VENTOLIN HFA Inhale 2-4 puffs into the lungs every 4 (four) hours as needed for wheezing (or cough). What changed: how much to take   ARIPiprazole 5 MG tablet Commonly known as: ABILIFY Take 1 tablet (5 mg total) by mouth daily.   atorvastatin 40 MG tablet Commonly known as: LIPITOR Take 1 tablet (40 mg total) by mouth daily.   Basaglar KwikPen 100 UNIT/ML Inject 0.2 mLs (20 Units total) into the skin in the morning.   bismuth subsalicylate 662 HU/76LY suspension Commonly known as: PEPTO BISMOL Take 30 mLs by mouth every 6 (six) hours as needed for indigestion or diarrhea or loose stools.   ceFEPime  IVPB Commonly known as: MAXIPIME Inject 2 g into the vein every 8 (eight) hours. Via PICC Line. Indication:  Presumed osteomyelitis of right foot First Dose: yes Last Day of Therapy:  02/22/2020 Labs - Once weekly:  CBC/D and BMP, Labs - Every other week:  ESR and CRP Method of administration: IV Push Method of administration may be changed at the discretion of home infusion pharmacist based upon assessment of the patient and/or caregiver's ability to self-administer the medication ordered.   diphenhydrAMINE 25 MG tablet Commonly known as: BENADRYL Take  25 mg by mouth every 6 (six) hours as needed for itching or allergies.   DULoxetine 60 MG capsule Commonly known as: CYMBALTA Take 1 capsule (60 mg total) by mouth daily.   Easy Touch Pen Needles 31G X 5 MM Misc Generic drug: Insulin Pen Needle Inject 1 application as directed 3 (three) times daily.   empagliflozin 25 MG Tabs  tablet Commonly known as: JARDIANCE Take 25 mg by mouth daily.   fluticasone 50 MCG/ACT nasal spray Commonly known as: FLONASE Place 1 spray into both nostrils daily as needed for allergies or rhinitis.   Fluticasone-Salmeterol 250-50 MCG/DOSE Aepb Commonly known as: Advair Diskus Inhale 1 puff into the lungs 2 (two) times daily.   freestyle lancets Use as instructed   FREESTYLE LITE test strip Generic drug: glucose blood Use as instructed   gabapentin 300 MG capsule Commonly known as: NEURONTIN Take 1 capsule (300 mg total) by mouth 3 (three) times daily.   ibuprofen 200 MG tablet Commonly known as: ADVIL Take 800 mg by mouth every 8 (eight) hours as needed for headache or moderate pain.   insulin lispro 100 UNIT/ML KwikPen Commonly known as: HumaLOG KwikPen Inject 0-0.1 mLs (0-10 Units total) into the skin 3 (three) times daily. What changed:   when to take this  additional instructions   Melatonin 10 MG Caps Take 30 mg by mouth at bedtime as needed (sleep).   metFORMIN 1000 MG tablet Commonly known as: GLUCOPHAGE Take 1 tablet (1,000 mg total) by mouth 2 (two) times daily with a meal.   metroNIDAZOLE 500 MG tablet Commonly known as: FLAGYL Take 1 tablet (500 mg total) by mouth every 8 (eight) hours.   mirtazapine 30 MG tablet Commonly known as: REMERON Take 1 tablet (30 mg total) by mouth at bedtime.   montelukast 10 MG tablet Commonly known as: SINGULAIR Take 1 tablet (10 mg total) by mouth at bedtime. What changed: when to take this   nitroGLYCERIN 0.2 mg/hr patch Commonly known as: NITRODUR - Dosed in mg/24 hr Place 1 patch (0.2 mg total) onto the skin daily. What changed: how much to take   oxyCODONE-acetaminophen 5-325 MG tablet Commonly known as: PERCOCET/ROXICET Take 1 tablet by mouth every 6 (six) hours as needed for moderate pain.   pentoxifylline 400 MG CR tablet Commonly known as: TRENTAL Take 1 tablet (400 mg total) by mouth 3 (three)  times daily with meals.   silver sulfADIAZINE 1 % cream Commonly known as: SILVADENE Apply 1 application topically daily. Apply to affected area daily plus dry dressing   sitaGLIPtin 100 MG tablet Commonly known as: Januvia Take 1 tablet (100 mg total) by mouth daily.   Valerian Root 450 MG Caps Take 900 tablets by mouth at bedtime.   vancomycin  IVPB Inject 1,500 mg into the vein every 12 (twelve) hours. Via PICC Line. Indication:  Presumed osteomyelitis of right foot First Dose: yes Last Day of Therapy:  02/22/2020 Labs - Sunday/Monday:  CBC/D, BMP, and vancomycin trough. Labs - Thursday:  BMP and vancomycin trough Labs - Every other week:  ESR and CRP Method of administration:Elastomeric Method of administration may be changed at the discretion of the patient and/or caregiver's ability to self-administer the medication ordered.   vancomycin HCl 1500 MG/300ML Soln Commonly known as: VANCOREADY Inject 300 mLs (1,500 mg total) into the vein every 12 (twelve) hours.            Durable Medical Equipment  (From admission, onward)  Start     Ordered   01/11/20 1658  For home use only DME Walker rolling  Once    Comments: S/p TMA  Question Answer Comment  Walker: With 5 Inch Wheels   Patient needs a walker to treat with the following condition Weakness generalized      01/11/20 1657   01/11/20 0719  For home use only DME Negative pressure wound device  Once    Question Answer Comment  Frequency of dressing change 3 times per week   Length of need 3 Months   Dressing type Foam   Amount of suction 125 mm/Hg   Pressure application Continuous pressure   Supplies 10 canisters and 15 dressings per month for duration of therapy      01/11/20 0721           Discharge Care Instructions  (From admission, onward)         Start     Ordered   01/12/20 0000  Change dressing on IV access line weekly and PRN  (Home infusion instructions - [facility] )       01/12/20 1216         Activity: activity as tolerated Diet: diabetic diet Wound Care: Wound VAC to right TMA site    Follow-up with Dr. Carlis Abbott in 3 weeks.  Signed: Barbie Banner, PA-C 01/13/2020 10:38 AM [facility] Office: [phone]

## 2020-01-13 NOTE — Progress Notes (Signed)
A STAT consult was placed to IV Team to cap and flush the picc line for home;  Pt is still receiving an antibiotic;  RN to re-enter the consult when the medication is complete.

## 2020-01-13 NOTE — Discharge Instructions (Signed)
Please bring wound VAC replacement sponge supplies with you to your appointment with Dr. Carlis Abbott, if available. Keep right foot dry. Heel bearing only.

## 2020-01-13 NOTE — Progress Notes (Addendum)
Progress Note    01/13/2020 7:42 AM 3 Days Post-Op  Subjective: No complaints   Vitals:   01/13/20 0040 01/13/20 0332  BP: 121/80 125/81  Pulse: 66 62  Resp: 15 12  Temp: (!) 97.4 F (36.3 C) 97.7 F (36.5 C)  SpO2: 97% 90%    Physical Exam: Cardiac: Rate rhythm regular Lungs: Nonlabored Extremities: Right foot wound VAC in place with good seal.  Thin bloody output  CBC    Component Value Date/Time   WBC 6.2 01/12/2020 0743   RBC 3.91 (L) 01/12/2020 0743   HGB 10.5 (L) 01/12/2020 0743   HGB 12.6 (L) 02/24/2018 1442   HCT 34.3 (L) 01/12/2020 0743   HCT 38.2 02/24/2018 1442   PLT 299 01/12/2020 0743   PLT 275 02/24/2018 1442   MCV 87.7 01/12/2020 0743   MCV 88 02/24/2018 1442   MCH 26.9 01/12/2020 0743   MCHC 30.6 01/12/2020 0743   RDW 14.0 01/12/2020 0743   RDW 13.8 02/24/2018 1442   LYMPHSABS 1.6 10/31/2019 1118   LYMPHSABS 1.8 02/24/2018 1442   MONOABS 0.8 10/31/2019 1118   EOSABS 0.2 10/31/2019 1118   EOSABS 0.1 02/24/2018 1442   BASOSABS 0.0 10/31/2019 1118   BASOSABS 0.0 02/24/2018 1442    BMET    Component Value Date/Time   NA 133 (L) 01/12/2020 0743   NA 136 09/29/2019 1016   K 5.0 01/12/2020 0743   CL 102 01/12/2020 0743   CO2 25 01/12/2020 0743   GLUCOSE 258 (H) 01/12/2020 0743   BUN 9 01/12/2020 0743   BUN 15 09/29/2019 1016   CREATININE 0.79 01/12/2020 0743   CALCIUM 8.6 (L) 01/12/2020 0743   GFRNONAA >60 01/12/2020 0743   GFRAA >60 01/12/2020 0743     Intake/Output Summary (Last 24 hours) at 01/13/2020 0742 Last data filed at 01/12/2020 2223 Gross per 24 hour  Intake 720 ml  Output 1880 ml  Net -1160 ml    HOSPITAL MEDICATIONS Scheduled Meds: . ARIPiprazole  5 mg Oral Daily  . atorvastatin  40 mg Oral Daily  . Chlorhexidine Gluconate Cloth  6 each Topical Daily  . docusate sodium  100 mg Oral Daily  . DULoxetine  60 mg Oral Daily  . gabapentin  300 mg Oral TID  . heparin injection (subcutaneous)  5,000 Units Subcutaneous  Q8H  . insulin aspart  0-15 Units Subcutaneous TID WC  . insulin glargine  20 Units Subcutaneous Daily  . metFORMIN  1,000 mg Oral BID WC  . metroNIDAZOLE  500 mg Oral Q8H  . mirtazapine  30 mg Oral QHS  . montelukast  10 mg Oral Daily  . nitroGLYCERIN  0.1 mg Transdermal Daily  . pantoprazole  40 mg Oral Daily  . pentoxifylline  400 mg Oral TID WC   Continuous Infusions: . ceFEPime (MAXIPIME) IV 2 g (01/13/20 0301)  . magnesium sulfate bolus IVPB    . vancomycin 1,500 mg (01/13/20 0559)   PRN Meds:.acetaminophen **OR** acetaminophen, albuterol, alum & mag hydroxide-simeth, bisacodyl, diphenhydrAMINE, guaiFENesin-dextromethorphan, hydrALAZINE, labetalol, magnesium sulfate bolus IVPB, metoprolol tartrate, ondansetron, oxyCODONE-acetaminophen, phenol, polyethylene glycol, potassium chloride, simethicone, sodium chloride flush  Assessment:  49 y.o. male is s/p:  Debridement of right TMA amputation site with wound VAC placement.  Infectious disease service was consulted for assistance with long-term antibiotics. 3 Days Post-Op  Plan: -Ready for discharge home today with home health.  PICC line has been placed.  Home wound VAC arranged. -DVT prophylaxis:  Heparin [place]   SANDRA  Nat Christen [facility] [phone] 01/13/2020  7:42 AM   I have seen and evaluated the patient. I agree with the PA note as documented above.  Status post debridement of right TMA after he sought second opinion.  We changed the VAC yesterday and overall the wound looks very good and is granulating.  Appreciate ID input and will be discharged on Vanc and cefepime for 6 weeks and adding oral Flagyl today for possible anaerobes.  He has received a PICC line.  Home health has been arranged for VAC changes 3 times a week.  We will arrange follow-up with me in about 3 weeks for wound check.  He knows to call our office if there is a problem sooner.  [name], MD [facility] [place] Office: [phone]

## 2020-01-13 NOTE — Plan of Care (Signed)
  Problem: Activity: Goal: Risk for activity intolerance will decrease Outcome: Progressing   Problem: Pain Managment: Goal: General experience of comfort will improve Outcome: Progressing   

## 2020-01-13 NOTE — Progress Notes (Signed)
Pt discharged today to home with wife.  IV team capp and flush PICC line for discharge and home use antibiotics. Pt taken off telemetry and CCMD notified.  Pt's mobile wound vac placed and pt sent home with all wound vac supplies.  Pt left with all of their personal belongings.

## 2020-01-15 ENCOUNTER — Other Ambulatory Visit: Payer: Self-pay | Admitting: *Deleted

## 2020-01-15 ENCOUNTER — Encounter: Payer: Self-pay | Admitting: *Deleted

## 2020-01-15 ENCOUNTER — Telehealth: Payer: Self-pay

## 2020-01-15 LAB — GLUCOSE, CAPILLARY: Glucose-Capillary: 122 mg/dL — ABNORMAL HIGH (ref 70–99)

## 2020-01-15 NOTE — Patient Outreach (Signed)
[facility]) Care Management  01/15/2020  Christopher Fernandez 02-Aug-1971 [id number]   Transition of care call   Referral received:01/11/20 Initial outreach:01/15/20 Insurance: UMR    Subjective: Initial successful  telephone call to patient's preferred number in order to complete transition of care assessment;  ,2 HIPAA identifiers verified. Explained purpose of call and completed transition of care assessment.  Christopher Fernandez states that he is doing good. He  denies post-operative problems he reports wound vac in place, has good suction, no alarms he reports history of wearing  wound VAC in the past .He reports minimal pain, and  states surgical pain well managed with prescribed medications.  He reports tolerating diet, denies bowel or bladder problems. Patient states that his wife is a Marine scientist and is assisting with his recovery administering  IV antibiotics , he states that he is managing well at home today. Patient working on  limiting mobility, keeping leg elevated,  he does have a rolling walker for use and wearing Darco shoe to limit pressure on foot.  Discussed assessing the following Christopher Fernandez benefits ; He discussing ongoing health issues of diabetes but states that he is managing it and working on getting A1c down. He discussed improved blood sugars since discharge, reports reading 100-150's he reports having freestyle libre for monitoring. Discussed  Christopher Fernandez chronic disease management programs he declined need for referral at this time, he discussed being aware of program.  He is unsure if his wife has the  the hospital indemnity plan , provided contact number to Christopher Fernandez and he will discuss with his wife.  He reports using [facility] outpatient pharmacy at [facility] outpatient pharmacy.      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  Comorbidities include: Peripheral vascular disease, COPD, Diabetes A1c 8.4% on 01/10/20, Hypertension , poor healing right transmetatarsal amputation site.  He was discharged to home on 01/13/20 without  home health services  with [facility] for start of care   01/16/20, for Home health RN wound vac change, PICC line care,  patient to continue IV antibiotic of Vancomycin and Cefepime. Durable medical equipment provided at discharge, Rolling walker, Wound Vac for home,    Assessment:  Patient voices good understanding of all discharge instructions.  See transition of care flowsheet for assessment details.   Plan:  Reviewed hospital discharge diagnosis of Right midfoot amputation,wound VAC placement  wound culture positive for MRSA and Serritia, Home  IV antibiotics    and discharge treatment plan using hospital discharge instructions, assessing medication adherence, reviewing problems requiring provider notification, and discussing the importance of follow up with surgeon, primary care provider and/or specialists as directed. Reviewed [place] healthy lifestyle program information to receive discounted premium for  2022   Step 1: Get  your annual physical  Step 2: Complete your health assessment between September 15, 2019 and May 15, 2020 at [email or web address]  Step 3:Identify your current health status and complete the corresponding action step between January 1, and May 15, 2020.   Patient agreeable to follow up call in the next 4 business days regarding coordination of home health services starting.  Will  route successful outreach letter with [facility] Management pamphlet and 24 Hour Nurse Line Magnet to [facility] Management clinical pool to be mailed to patient's home address.    Christopher Fernandez  Christopher Maria RN, BSN  [facility] Management Coordinator  [phone]- Mobile [phone]- Toll Free Main Office

## 2020-01-15 NOTE — Telephone Encounter (Signed)
Pt called requesting letter for his attorney stating that he is unable to attend upcoming court dates. Per PA and d/c instructions he is able to do activity as tolerated. D/C instructions state he is to be set up with a walker. Explained this to pt. Will call back with further questions/concerns.

## 2020-01-16 ENCOUNTER — Telehealth: Payer: Self-pay

## 2020-01-16 DIAGNOSIS — E1169 Type 2 diabetes mellitus with other specified complication: Secondary | ICD-10-CM | POA: Diagnosis not present

## 2020-01-16 DIAGNOSIS — A4153 Sepsis due to Serratia: Secondary | ICD-10-CM | POA: Diagnosis not present

## 2020-01-16 DIAGNOSIS — B9562 Methicillin resistant Staphylococcus aureus infection as the cause of diseases classified elsewhere: Secondary | ICD-10-CM | POA: Diagnosis not present

## 2020-01-16 DIAGNOSIS — T8743 Infection of amputation stump, right lower extremity: Secondary | ICD-10-CM | POA: Diagnosis not present

## 2020-01-16 DIAGNOSIS — M869 Osteomyelitis, unspecified: Secondary | ICD-10-CM | POA: Diagnosis not present

## 2020-01-16 DIAGNOSIS — L02611 Cutaneous abscess of right foot: Secondary | ICD-10-CM | POA: Diagnosis not present

## 2020-01-16 NOTE — Telephone Encounter (Signed)
[facility] RN LVM on nurse line requesting verbal orders for wound care and management of PICC line. Please call [facility] RN to give verbal ok [phone].

## 2020-01-17 NOTE — Telephone Encounter (Signed)
Attempted to call [phone] but that was an incorrect number. Tried [phone] but there was no answer and did not want to leave a VM that may not be the correct VM as it would not be secure.

## 2020-01-18 ENCOUNTER — Other Ambulatory Visit: Payer: Self-pay | Admitting: *Deleted

## 2020-01-18 ENCOUNTER — Encounter: Payer: 59 | Admitting: Gastroenterology

## 2020-01-18 DIAGNOSIS — L02611 Cutaneous abscess of right foot: Secondary | ICD-10-CM | POA: Diagnosis not present

## 2020-01-18 DIAGNOSIS — T8743 Infection of amputation stump, right lower extremity: Secondary | ICD-10-CM | POA: Diagnosis not present

## 2020-01-18 DIAGNOSIS — E1169 Type 2 diabetes mellitus with other specified complication: Secondary | ICD-10-CM | POA: Diagnosis not present

## 2020-01-18 DIAGNOSIS — M869 Osteomyelitis, unspecified: Secondary | ICD-10-CM | POA: Diagnosis not present

## 2020-01-18 DIAGNOSIS — A4153 Sepsis due to Serratia: Secondary | ICD-10-CM | POA: Diagnosis not present

## 2020-01-18 DIAGNOSIS — B9562 Methicillin resistant Staphylococcus aureus infection as the cause of diseases classified elsewhere: Secondary | ICD-10-CM | POA: Diagnosis not present

## 2020-01-18 NOTE — Patient Outreach (Signed)
[facility]) Care Management  01/18/2020  BOBBI SCHAUS Jul 10, 1971 [id number]   Transition of care follow up call /Case Closure    Referral received:01/11/20 Initial outreach:01/15/20 Insurance: UMR     Subjective : Successful outreach call to patient, He states things are going well and he is pleased with progress. He reports [place] home health RN has visited twice this week and wound vac dressing change completed again on this am.  He reports wound site without noted problems. He reports pain controlled.  He reports that his blood sugars are staying in good range 100-160 before meals.  He denies any new concerns at this time.    Objective:  Mr. 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 Comorbidities include: Peripheral vascular disease, COPD, Diabetes A1c 8.4% on 01/10/20, Hypertension , poor healing right transmetatarsal amputation site.  He was discharged to home on 01/13/20 without  home health services with [facility] for start of care   01/16/20, for Home health RN wound vac change, PICC line care,  patient to continue IV antibiotic of Vancomycin and Cefepime. Durable medical equipment provided at discharge, Rolling walker, Wound Vac for home,   Assessment:  Progressing well post discharge, no ongoing care management needs identified. Home Health care initiated for wound vac dressing change.   Plan Will close case to [facility] care management.    Joylene Draft, RN, BSN  [facility] Management Coordinator  [phone]- Mobile [phone]- Toll Free Main Office

## 2020-01-19 ENCOUNTER — Encounter: Payer: 59 | Admitting: Internal Medicine

## 2020-01-19 DIAGNOSIS — L02611 Cutaneous abscess of right foot: Secondary | ICD-10-CM | POA: Diagnosis not present

## 2020-01-19 DIAGNOSIS — S98011A Complete traumatic amputation of right foot at ankle level, initial encounter: Secondary | ICD-10-CM | POA: Diagnosis not present

## 2020-01-19 MED FILL — DULoxetine HCL 60 MG CPEP: 60 | 90 days supply | Qty: 90 | Fill #1

## 2020-01-19 MED FILL — ARIPiprazole 5 MG TABS: 5 | 90 days supply | Qty: 90 | Fill #1

## 2020-01-22 ENCOUNTER — Encounter: Payer: Self-pay | Admitting: Infectious Disease

## 2020-01-22 ENCOUNTER — Ambulatory Visit: Payer: 59 | Admitting: Internal Medicine

## 2020-01-22 DIAGNOSIS — L02611 Cutaneous abscess of right foot: Secondary | ICD-10-CM | POA: Diagnosis not present

## 2020-01-22 DIAGNOSIS — E1169 Type 2 diabetes mellitus with other specified complication: Secondary | ICD-10-CM | POA: Diagnosis not present

## 2020-01-22 DIAGNOSIS — M869 Osteomyelitis, unspecified: Secondary | ICD-10-CM | POA: Diagnosis not present

## 2020-01-22 DIAGNOSIS — B9562 Methicillin resistant Staphylococcus aureus infection as the cause of diseases classified elsewhere: Secondary | ICD-10-CM | POA: Diagnosis not present

## 2020-01-22 DIAGNOSIS — T8743 Infection of amputation stump, right lower extremity: Secondary | ICD-10-CM | POA: Diagnosis not present

## 2020-01-22 DIAGNOSIS — A4153 Sepsis due to Serratia: Secondary | ICD-10-CM | POA: Diagnosis not present

## 2020-01-24 DIAGNOSIS — B9562 Methicillin resistant Staphylococcus aureus infection as the cause of diseases classified elsewhere: Secondary | ICD-10-CM | POA: Diagnosis not present

## 2020-01-24 DIAGNOSIS — L02611 Cutaneous abscess of right foot: Secondary | ICD-10-CM | POA: Diagnosis not present

## 2020-01-24 DIAGNOSIS — T8743 Infection of amputation stump, right lower extremity: Secondary | ICD-10-CM | POA: Diagnosis not present

## 2020-01-24 DIAGNOSIS — M869 Osteomyelitis, unspecified: Secondary | ICD-10-CM | POA: Diagnosis not present

## 2020-01-24 DIAGNOSIS — E1169 Type 2 diabetes mellitus with other specified complication: Secondary | ICD-10-CM | POA: Diagnosis not present

## 2020-01-26 DIAGNOSIS — E1169 Type 2 diabetes mellitus with other specified complication: Secondary | ICD-10-CM | POA: Diagnosis not present

## 2020-01-26 DIAGNOSIS — B9562 Methicillin resistant Staphylococcus aureus infection as the cause of diseases classified elsewhere: Secondary | ICD-10-CM | POA: Diagnosis not present

## 2020-01-26 DIAGNOSIS — M869 Osteomyelitis, unspecified: Secondary | ICD-10-CM | POA: Diagnosis not present

## 2020-01-26 DIAGNOSIS — L02611 Cutaneous abscess of right foot: Secondary | ICD-10-CM | POA: Diagnosis not present

## 2020-01-26 DIAGNOSIS — T8743 Infection of amputation stump, right lower extremity: Secondary | ICD-10-CM | POA: Diagnosis not present

## 2020-01-26 DIAGNOSIS — T8149XA Infection following a procedure, other surgical site, initial encounter: Secondary | ICD-10-CM | POA: Diagnosis not present

## 2020-01-27 DIAGNOSIS — L02611 Cutaneous abscess of right foot: Secondary | ICD-10-CM | POA: Diagnosis not present

## 2020-01-27 DIAGNOSIS — S98011A Complete traumatic amputation of right foot at ankle level, initial encounter: Secondary | ICD-10-CM | POA: Diagnosis not present

## 2020-01-29 DIAGNOSIS — E1169 Type 2 diabetes mellitus with other specified complication: Secondary | ICD-10-CM | POA: Diagnosis not present

## 2020-01-29 DIAGNOSIS — M869 Osteomyelitis, unspecified: Secondary | ICD-10-CM | POA: Diagnosis not present

## 2020-01-29 DIAGNOSIS — B9562 Methicillin resistant Staphylococcus aureus infection as the cause of diseases classified elsewhere: Secondary | ICD-10-CM | POA: Diagnosis not present

## 2020-01-29 DIAGNOSIS — T8743 Infection of amputation stump, right lower extremity: Secondary | ICD-10-CM | POA: Diagnosis not present

## 2020-01-29 DIAGNOSIS — L02611 Cutaneous abscess of right foot: Secondary | ICD-10-CM | POA: Diagnosis not present

## 2020-01-29 DIAGNOSIS — A4153 Sepsis due to Serratia: Secondary | ICD-10-CM | POA: Diagnosis not present

## 2020-01-31 ENCOUNTER — Encounter: Payer: Self-pay | Admitting: Infectious Disease

## 2020-01-31 DIAGNOSIS — B9562 Methicillin resistant Staphylococcus aureus infection as the cause of diseases classified elsewhere: Secondary | ICD-10-CM | POA: Diagnosis not present

## 2020-01-31 DIAGNOSIS — T8189XA Other complications of procedures, not elsewhere classified, initial encounter: Secondary | ICD-10-CM | POA: Diagnosis not present

## 2020-01-31 DIAGNOSIS — T8743 Infection of amputation stump, right lower extremity: Secondary | ICD-10-CM | POA: Diagnosis not present

## 2020-01-31 DIAGNOSIS — E1169 Type 2 diabetes mellitus with other specified complication: Secondary | ICD-10-CM | POA: Diagnosis not present

## 2020-01-31 DIAGNOSIS — M869 Osteomyelitis, unspecified: Secondary | ICD-10-CM | POA: Diagnosis not present

## 2020-01-31 DIAGNOSIS — L02611 Cutaneous abscess of right foot: Secondary | ICD-10-CM | POA: Diagnosis not present

## 2020-02-01 ENCOUNTER — Encounter: Payer: Self-pay | Admitting: Family Medicine

## 2020-02-01 MED FILL — PENTOXIFYLLINE 400 MG TAB S: 400 | 30 days supply | Qty: 90 | Fill #2

## 2020-02-01 MED FILL — JARDIANCE 25 MG TABLET: 25 | 90 days supply | Qty: 90 | Fill #1

## 2020-02-02 ENCOUNTER — Encounter: Payer: Self-pay | Admitting: Family Medicine

## 2020-02-02 ENCOUNTER — Other Ambulatory Visit: Payer: Self-pay | Admitting: Family Medicine

## 2020-02-02 DIAGNOSIS — B9562 Methicillin resistant Staphylococcus aureus infection as the cause of diseases classified elsewhere: Secondary | ICD-10-CM | POA: Diagnosis not present

## 2020-02-02 DIAGNOSIS — E1165 Type 2 diabetes mellitus with hyperglycemia: Secondary | ICD-10-CM

## 2020-02-02 DIAGNOSIS — E1169 Type 2 diabetes mellitus with other specified complication: Secondary | ICD-10-CM | POA: Diagnosis not present

## 2020-02-02 DIAGNOSIS — T8743 Infection of amputation stump, right lower extremity: Secondary | ICD-10-CM | POA: Diagnosis not present

## 2020-02-02 DIAGNOSIS — Z794 Long term (current) use of insulin: Secondary | ICD-10-CM

## 2020-02-02 DIAGNOSIS — M869 Osteomyelitis, unspecified: Secondary | ICD-10-CM | POA: Diagnosis not present

## 2020-02-02 DIAGNOSIS — L02611 Cutaneous abscess of right foot: Secondary | ICD-10-CM | POA: Diagnosis not present

## 2020-02-02 MED ORDER — FREESTYLE LIBRE 14 DAY SENSOR MISC: 6 refills | Status: DC

## 2020-02-02 MED FILL — FREESTYLE LITE TEST STRIP: 30 days supply | Qty: 100 | Fill #6

## 2020-02-03 DIAGNOSIS — L02611 Cutaneous abscess of right foot: Secondary | ICD-10-CM | POA: Diagnosis not present

## 2020-02-03 DIAGNOSIS — S98011A Complete traumatic amputation of right foot at ankle level, initial encounter: Secondary | ICD-10-CM | POA: Diagnosis not present

## 2020-02-05 DIAGNOSIS — B9562 Methicillin resistant Staphylococcus aureus infection as the cause of diseases classified elsewhere: Secondary | ICD-10-CM | POA: Diagnosis not present

## 2020-02-05 DIAGNOSIS — L02611 Cutaneous abscess of right foot: Secondary | ICD-10-CM | POA: Diagnosis not present

## 2020-02-05 DIAGNOSIS — T8743 Infection of amputation stump, right lower extremity: Secondary | ICD-10-CM | POA: Diagnosis not present

## 2020-02-05 DIAGNOSIS — M869 Osteomyelitis, unspecified: Secondary | ICD-10-CM | POA: Diagnosis not present

## 2020-02-05 DIAGNOSIS — E1169 Type 2 diabetes mellitus with other specified complication: Secondary | ICD-10-CM | POA: Diagnosis not present

## 2020-02-06 ENCOUNTER — Ambulatory Visit (INDEPENDENT_AMBULATORY_CARE_PROVIDER_SITE_OTHER): Payer: Self-pay | Admitting: Physician Assistant

## 2020-02-06 ENCOUNTER — Other Ambulatory Visit: Payer: Self-pay

## 2020-02-06 VITALS — BP 124/83 | HR 96 | Temp 97.6°F | Resp 18 | Ht 75.0 in | Wt 275.0 lb

## 2020-02-06 DIAGNOSIS — Z89431 Acquired absence of right foot: Secondary | ICD-10-CM

## 2020-02-06 NOTE — Progress Notes (Signed)
POST OPERATIVE OFFICE NOTE    CC:  F/u for surgery  HPI:  This is a 49 y.o. male who is s/p sharp excisional debridement of right transmetatarsal amputation (4.5 cm x 7 cm) & application of negative pressure wound VAC to right transmetatarsal amputation on 01/10/2020 by Dr. Carlis Abbott.  In the hospital, Intraoperative wound cultures were obtained.  Intravenous antibiotics were initiated.  Infectious disease consultation was obtained and due to his previous positive wound cultures, IV antibiotics for 6 weeks was recommended.  A PICC line was placed.  He was placed on 6 weeks of Vanc and cefepime for 6 weeks and oral Flagyl was added for possible anerobes.    His wound VAC was changed and the wound revealed early granulation tissue.  He remained hemodynamically stable and afebrile.  Pain well controlled.  A previous Darco shoe had been obtained and he used this to ambulate with heel bearing only.  Denies fever or chills  Allergies  Allergen Reactions  . Eggs Or Egg-Derived Products Rash  . Morphine And Related Other (See Comments)    Cant take because of pancreatitis  . Cocoa Rash    Current Outpatient Medications  Medication Sig Dispense Refill  . albuterol (VENTOLIN HFA) 108 (90 Base) MCG/ACT inhaler Inhale 2-4 puffs into the lungs every 4 (four) hours as needed for wheezing (or cough). (Patient taking differently: Inhale 2 puffs into the lungs every 4 (four) hours as needed for wheezing (or cough). ) 6.7 g 11  . ARIPiprazole (ABILIFY) 5 MG tablet Take 1 tablet (5 mg total) by mouth daily. 90 tablet 2  . atorvastatin (LIPITOR) 40 MG tablet Take 1 tablet (40 mg total) by mouth daily. 90 tablet 3  . bismuth subsalicylate (PEPTO BISMOL) 262 MG/15ML suspension Take 30 mLs by mouth every 6 (six) hours as needed for indigestion or diarrhea or loose stools.    [name] ceFEPime (MAXIPIME) IVPB Inject 2 g into the vein every 8 (eight) hours. Via PICC Line. 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 126 Units 0  . Continuous Blood Gluc Sensor (FREESTYLE LIBRE 14 DAY SENSOR) MISC Use one sensor every 14 days 6 each 6  . diphenhydrAMINE (BENADRYL) 25 MG tablet Take 25 mg by mouth every 6 (six) hours as needed for itching or allergies.     . DULoxetine (CYMBALTA) 60 MG capsule Take 1 capsule (60 mg total) by mouth daily. 90 capsule 2  . EASY TOUCH PEN NEEDLES 31G X 5 MM MISC Inject 1 application as directed 3 (three) times daily. 100 each 11  . empagliflozin (JARDIANCE) 25 MG TABS tablet Take 25 mg by mouth daily. 90 tablet 3  . fluticasone (FLONASE) 50 MCG/ACT nasal spray Place 1 spray into both nostrils daily as needed for allergies or rhinitis.    . Fluticasone-Salmeterol (ADVAIR DISKUS) 250-50 MCG/DOSE AEPB Inhale 1 puff into the lungs 2 (two) times daily. 180 each 3  . gabapentin (NEURONTIN) 300 MG capsule Take 1 capsule (300 mg total) by mouth 3 (three) times daily. 90 capsule 2  . glucose blood (FREESTYLE LITE) test strip Use as instructed 100 each 12  . ibuprofen (ADVIL) 200  MG tablet Take 800 mg by mouth every 8 (eight) hours as needed for headache or moderate pain.     . Insulin Glargine (BASAGLAR KWIKPEN) 100 UNIT/ML Inject 0.2 mLs (20 Units total) into the skin in the morning. 9 mL 2  . insulin lispro (HUMALOG KWIKPEN) 100 UNIT/ML KwikPen Inject 0-0.1 mLs (0-10 Units total) into the skin 3 (three) times daily. (Patient taking differently: Inject 0-10 Units into the skin 3 (three) times daily with meals. Sliding scale) 12 mL 0  . Lancets (FREESTYLE) lancets Use as instructed 100 each 12  . Melatonin 10 MG CAPS Take 30 mg by mouth at  bedtime as needed (sleep).     . metFORMIN (GLUCOPHAGE) 1000 MG tablet Take 1 tablet (1,000 mg total) by mouth 2 (two) times daily with a meal. 180 tablet 3  . metroNIDAZOLE (FLAGYL) 500 MG tablet Take 1 tablet (500 mg total) by mouth every 8 (eight) hours. 126 tablet 0  . mirtazapine (REMERON) 30 MG tablet Take 1 tablet (30 mg total) by mouth at bedtime. 90 tablet 3  . montelukast (SINGULAIR) 10 MG tablet Take 1 tablet (10 mg total) by mouth at bedtime. (Patient taking differently: Take 10 mg by mouth daily. ) 90 tablet 3  . nitroGLYCERIN (NITRODUR - DOSED IN MG/24 HR) 0.2 mg/hr patch Place 1 patch (0.2 mg total) onto the skin daily. (Patient taking differently: Place 0.1 mg onto the skin daily. ) 30 patch 12  . oxyCODONE-acetaminophen (PERCOCET/ROXICET) 5-325 MG tablet Take 1 tablet by mouth every 6 (six) hours as needed for moderate pain. 20 tablet 0  . pentoxifylline (TRENTAL) 400 MG CR tablet Take 1 tablet (400 mg total) by mouth 3 (three) times daily with meals. 90 tablet 3  . silver sulfADIAZINE (SILVADENE) 1 % cream Apply 1 application topically daily. Apply to affected area daily plus dry dressing (Patient not taking: Reported on 01/15/2020) 400 g 3  . sitaGLIPtin (JANUVIA) 100 MG tablet Take 1 tablet (100 mg total) by mouth daily. 30 tablet 10  . Valerian Root 450 MG CAPS Take 900 tablets by mouth at bedtime.     . vancomycin HCl (VANCOREADY) 1500 MG/300ML SOLN Inject 300 mLs (1,500 mg total) into the vein every 12 (twelve) hours. 4000 mL 1  . vancomycin IVPB Inject 1,500 mg into the vein every 12 (twelve) hours. Via PICC Line. Indication:  Presumed osteomyelitis of right foot First Dose: yes Last Day of Therapy:  02/22/2020 Labs - Sunday/Monday:  CBC/D, BMP, and vancomycin trough. Labs - Thursday:  BMP and vancomycin trough Labs - Every other week:  ESR and CRP Method of administration:Elastomeric Method of administration may be changed at the discretion of the patient and/or caregiver's  ability to self-administer the medication ordered. 84 Units 0   No current facility-administered medications for this visit.     ROS:  See HPI  Physical Exam: Today's Vitals   02/06/20 1417  BP: 124/83  Pulse: 96  Resp: 18  Temp: 97.6 F (36.4 C)  TempSrc: Temporal  SpO2: 97%  Weight: 275 lb (124.7 kg)  Height: '6\' 3"'  (1.905 m)   Body mass index is 34.37 kg/m.   Extremities:  Right foot debridement/TMA site continues to heal with good granulation. Measures 5cm X 3cm and no appreciable depth.  Edges probed with sterile cotton tipped applicator>>no tunneling  Neuro: A and O times 4      Assessment/Plan:  This is a 48 y.o. male who is s/p: 1.  Sharp excisional  debridement of right transmetatarsal amputation (4.5 cm x 7 cm) 2.  Application of negative pressure wound VAC to right transmetatarsal amputation  -Continue VAC dressing and recheck in 3 weeks. ABx continue to complete 6 week course   Barbie Banner, PA-C [facility] [phone]  Clinic MD:  Carlis Abbott

## 2020-02-07 DIAGNOSIS — T8743 Infection of amputation stump, right lower extremity: Secondary | ICD-10-CM | POA: Diagnosis not present

## 2020-02-07 DIAGNOSIS — B9562 Methicillin resistant Staphylococcus aureus infection as the cause of diseases classified elsewhere: Secondary | ICD-10-CM | POA: Diagnosis not present

## 2020-02-07 DIAGNOSIS — E1169 Type 2 diabetes mellitus with other specified complication: Secondary | ICD-10-CM | POA: Diagnosis not present

## 2020-02-07 DIAGNOSIS — M869 Osteomyelitis, unspecified: Secondary | ICD-10-CM | POA: Diagnosis not present

## 2020-02-07 DIAGNOSIS — L02611 Cutaneous abscess of right foot: Secondary | ICD-10-CM | POA: Diagnosis not present

## 2020-02-09 DIAGNOSIS — S98011A Complete traumatic amputation of right foot at ankle level, initial encounter: Secondary | ICD-10-CM | POA: Diagnosis not present

## 2020-02-09 DIAGNOSIS — L02611 Cutaneous abscess of right foot: Secondary | ICD-10-CM | POA: Diagnosis not present

## 2020-02-09 DIAGNOSIS — M869 Osteomyelitis, unspecified: Secondary | ICD-10-CM | POA: Diagnosis not present

## 2020-02-09 DIAGNOSIS — T8743 Infection of amputation stump, right lower extremity: Secondary | ICD-10-CM | POA: Diagnosis not present

## 2020-02-09 DIAGNOSIS — E1169 Type 2 diabetes mellitus with other specified complication: Secondary | ICD-10-CM | POA: Diagnosis not present

## 2020-02-09 DIAGNOSIS — B9562 Methicillin resistant Staphylococcus aureus infection as the cause of diseases classified elsewhere: Secondary | ICD-10-CM | POA: Diagnosis not present

## 2020-02-11 DIAGNOSIS — T8189XA Other complications of procedures, not elsewhere classified, initial encounter: Secondary | ICD-10-CM | POA: Diagnosis not present

## 2020-02-13 ENCOUNTER — Ambulatory Visit: Payer: 59 | Admitting: Infectious Disease

## 2020-02-13 ENCOUNTER — Encounter: Payer: Self-pay | Admitting: Infectious Disease

## 2020-02-13 ENCOUNTER — Other Ambulatory Visit: Payer: Self-pay

## 2020-02-13 VITALS — BP 116/83 | HR 91 | Temp 98.0°F | Ht 75.0 in | Wt 263.0 lb

## 2020-02-13 DIAGNOSIS — E1169 Type 2 diabetes mellitus with other specified complication: Secondary | ICD-10-CM | POA: Diagnosis not present

## 2020-02-13 DIAGNOSIS — E1165 Type 2 diabetes mellitus with hyperglycemia: Secondary | ICD-10-CM

## 2020-02-13 DIAGNOSIS — Z794 Long term (current) use of insulin: Secondary | ICD-10-CM

## 2020-02-13 DIAGNOSIS — M8600 Acute hematogenous osteomyelitis, unspecified site: Secondary | ICD-10-CM | POA: Diagnosis not present

## 2020-02-13 DIAGNOSIS — Z89431 Acquired absence of right foot: Secondary | ICD-10-CM

## 2020-02-13 DIAGNOSIS — A4902 Methicillin resistant Staphylococcus aureus infection, unspecified site: Secondary | ICD-10-CM

## 2020-02-13 DIAGNOSIS — F17200 Nicotine dependence, unspecified, uncomplicated: Secondary | ICD-10-CM | POA: Diagnosis not present

## 2020-02-13 DIAGNOSIS — A498 Other bacterial infections of unspecified site: Secondary | ICD-10-CM

## 2020-02-13 DIAGNOSIS — B9562 Methicillin resistant Staphylococcus aureus infection as the cause of diseases classified elsewhere: Secondary | ICD-10-CM | POA: Diagnosis not present

## 2020-02-13 DIAGNOSIS — T8743 Infection of amputation stump, right lower extremity: Secondary | ICD-10-CM | POA: Diagnosis not present

## 2020-02-13 DIAGNOSIS — M869 Osteomyelitis, unspecified: Secondary | ICD-10-CM | POA: Diagnosis not present

## 2020-02-13 DIAGNOSIS — L02611 Cutaneous abscess of right foot: Secondary | ICD-10-CM | POA: Diagnosis not present

## 2020-02-13 NOTE — Progress Notes (Signed)
Subjective:  Chief complaint: Some pain bottom of his foot just near where the vacuum dressing inserts also anxiety whether infection might be cured or not.    Patient ID: Christopher Fernandez, male    DOB: [birth year], 49 y.o.   MRN: [id number]  HPI  36 .o. diabetic male smoker who is here with non-healing wound of recent R TMA. He has cultured out MRSA and serratia consistently in the past and has been most recently on Bactrim x 30d. Intraoperative cultures are pending currently.    Plain films in February did not show any osteomyelitis of the foot at that time, however he has had ongoing surgery with recent TMA and revision.  We have been presumptively treating him for osteomyelitis cefepime and vancomycin oral flagyl.  His sed rate has been downtrending with advanced home care being in the 70s now going into the 40s on therapy.  Has been seen in follow-up by vascular surgery were happy with the appearance of his wound but applied a wound vacuum as well.  He has had pain on the bottom of the foot just near the site of surgery which he said has persisted.  He initially thought this was due to positioning of the wound VAC but it has persisted.  He is also anxious about whether he might have residual bone infection or whether we know for sure that he had bone infection in the first place.  I told him that we needed to presume that he had bone infection.  I also told him we ordered an MRI there could be some confusion about interpretation given the fact that he had had surgery and might be difficult distinguish postoperative changes from bone infection but I think given his pain and also is concerned is reasonable to proceed with MRI with contrast of the right foot    Past Medical History:  Diagnosis Date  . Alcoholism ([place])   . Anemia   . Anxiety   . Arthritis   . Asthma   . COPD (chronic obstructive pulmonary disease) ([place])   . Depression   . Diabetes ([place])    type 2  . GERD  (gastroesophageal reflux disease)   . History of hiatal hernia   . HTN (hypertension)   . Osteomyelitis ([place])    right forefoot  . Pancreatitis   . Pneumonia   . Wears glasses     Past Surgical History:  Procedure Laterality Date  . 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 AMPUTATION Right 10/18/2019   Procedure: RIGHT SECOND TOE AMPUTATION;  Surgeon: Newt Minion, MD;  Location: [place];  Service: Orthopedics;  Laterality: Right;  . AMPUTATION Right 11/08/2019   Procedure: RIGHT TRANSMETATARSAL AMPUTATION;  Surgeon: Newt Minion, MD;  Location: [place];  Service: Orthopedics;  Laterality: Right;  . APPENDECTOMY    . APPLICATION OF WOUND VAC Right 01/10/2020   Procedure: APPLICATION OF WOUND VAC, right foot;  Surgeon: [name], MD;  Location: [place];  Service: Vascular;  Laterality: Right;  . CHOLECYSTECTOMY  2006   with gallbladder and spleen  . COLONOSCOPY  8-10 years ago    in [place] exam per pt  . HERNIA REPAIR  2008, 2010   hiatal hernia and 1 additional  . NASAL SEPTUM SURGERY    . PANCREATIC PSEUDOCYST DRAINAGE    . SPLENECTOMY  2006  . STUMP REVISION Right 11/24/2019   Procedure: REVISION RIGHT TRANSMETATARSAL AMPUTATION;  Surgeon: Meridee Score  V, MD;  Location: [place];  Service: Orthopedics;  Laterality: Right;  . TOTAL KNEE ARTHROPLASTY Right 1982, 1984  . WOUND DEBRIDEMENT Right 01/10/2020   Procedure: incisional DEBRIDEMENT of RIGHT TRANSMETATARSAL WOUND;  Surgeon: [name], MD;  Location: [facility];  Service: Vascular;  Laterality: Right;    Family History  Problem Relation Age of Onset  . Diabetes Mother   . Hypertension Mother   . Hyperlipidemia Mother   . Kidney disease Mother   . Thyroid disease Mother   . Breast cancer Mother        mets  . Lung cancer Mother   . Diabetes Father   . Alcohol abuse Sister   . Sickle cell trait Sister   . Diabetes Brother   . Asthma Brother   . Lung cancer Maternal Grandmother   .  Kidney disease Maternal Grandmother   . Liver cancer Maternal Uncle        x 4-5  . Kidney disease Maternal Uncle   . Kidney disease Paternal Uncle   . Colon cancer Neg Hx   . Colon polyps Neg Hx   . Esophageal cancer Neg Hx   . Rectal cancer Neg Hx   . Stomach cancer Neg Hx       Social History   Socioeconomic History  . Marital status: Married    Spouse name: Not on file  . Number of children: 1  . Years of education: Not on file  . Highest education level: Not on file  Occupational History  . Not on file  Tobacco Use  . Smoking status: Current Every Day Smoker    Packs/day: 0.50    Types: Cigarettes  . Smokeless tobacco: Never Used  Substance and Sexual Activity  . Alcohol use: Not Currently    Comment: 2-3 per day, none since 2019  . Drug use: Not Currently  . Sexual activity: Not on file  Other Topics Concern  . Not on file  Social History Narrative  . Not on file   Social Determinants of Health   Financial Resource Strain:   . Difficulty of Paying Living Expenses:   Food Insecurity:   . Worried About Charity fundraiser in the Last Year:   . Arboriculturist in the Last Year:   Transportation Needs:   . Film/video editor (Medical):   [name] Lack of Transportation (Non-Medical):   Physical Activity:   . Days of Exercise per Week:   . Minutes of Exercise per Session:   Stress:   . Feeling of Stress :   Social Connections:   . Frequency of Communication with Friends and Family:   . Frequency of Social Gatherings with Friends and Family:   . Attends Religious Services:   . Active Member of Clubs or Organizations:   . Attends Archivist Meetings:   [name] Marital Status:     Allergies  Allergen Reactions  . Eggs Or Egg-Derived Products Rash  . Morphine And Related Other (See Comments)    Cant take because of pancreatitis  . Cocoa Rash     Current Outpatient Medications:  .  albuterol (VENTOLIN HFA) 108 (90 Base) MCG/ACT inhaler, Inhale 2-4  puffs into the lungs every 4 (four) hours as needed for wheezing (or cough). (Patient taking differently: Inhale 2 puffs into the lungs every 4 (four) hours as needed for wheezing (or cough). ), Disp: 6.7 g, Rfl: 11 .  ARIPiprazole (ABILIFY) 5 MG tablet, Take 1  tablet (5 mg total) by mouth daily., Disp: 90 tablet, Rfl: 2 .  atorvastatin (LIPITOR) 40 MG tablet, Take 1 tablet (40 mg total) by mouth daily., Disp: 90 tablet, Rfl: 3 .  bismuth subsalicylate (PEPTO BISMOL) 262 MG/15ML suspension, Take 30 mLs by mouth every 6 (six) hours as needed for indigestion or diarrhea or loose stools., Disp: , Rfl:  .  ceFEPime (MAXIPIME) IVPB, Inject 2 g into the vein every 8 (eight) hours. Via PICC Line. Indication:  Presumed osteomyelitis of right foot First Dose: yes Last Day of Therapy:  02/22/2020 Labs - Once weekly:  CBC/D and BMP, Labs - Every other week:  ESR and CRP Method of administration: IV Push Method of administration may be changed at the discretion of home infusion pharmacist based upon assessment of the patient and/or caregiver's ability to self-administer the medication ordered., Disp: 126 Units, Rfl: 0 .  Continuous Blood Gluc Sensor (FREESTYLE LIBRE 14 DAY SENSOR) MISC, Use one sensor every 14 days, Disp: 6 each, Rfl: 6 .  diphenhydrAMINE (BENADRYL) 25 MG tablet, Take 25 mg by mouth every 6 (six) hours as needed for itching or allergies. , Disp: , Rfl:  .  DULoxetine (CYMBALTA) 60 MG capsule, Take 1 capsule (60 mg total) by mouth daily., Disp: 90 capsule, Rfl: 2 .  EASY TOUCH PEN NEEDLES 31G X 5 MM MISC, Inject 1 application as directed 3 (three) times daily., Disp: 100 each, Rfl: 11 .  empagliflozin (JARDIANCE) 25 MG TABS tablet, Take 25 mg by mouth daily., Disp: 90 tablet, Rfl: 3 .  fluticasone (FLONASE) 50 MCG/ACT nasal spray, Place 1 spray into both nostrils daily as needed for allergies or rhinitis., Disp: , Rfl:  .  Fluticasone-Salmeterol (ADVAIR DISKUS) 250-50 MCG/DOSE AEPB, Inhale 1 puff into  the lungs 2 (two) times daily., Disp: 180 each, Rfl: 3 .  gabapentin (NEURONTIN) 300 MG capsule, Take 1 capsule (300 mg total) by mouth 3 (three) times daily., Disp: 90 capsule, Rfl: 2 .  glucose blood (FREESTYLE LITE) test strip, Use as instructed, Disp: 100 each, Rfl: 12 .  ibuprofen (ADVIL) 200 MG tablet, Take 800 mg by mouth every 8 (eight) hours as needed for headache or moderate pain. , Disp: , Rfl:  .  Insulin Glargine (BASAGLAR KWIKPEN) 100 UNIT/ML, Inject 0.2 mLs (20 Units total) into the skin in the morning., Disp: 9 mL, Rfl: 2 .  insulin lispro (HUMALOG KWIKPEN) 100 UNIT/ML KwikPen, Inject 0-0.1 mLs (0-10 Units total) into the skin 3 (three) times daily. (Patient taking differently: Inject 0-10 Units into the skin 3 (three) times daily with meals. Sliding scale), Disp: 12 mL, Rfl: 0 .  Lancets (FREESTYLE) lancets, Use as instructed, Disp: 100 each, Rfl: 12 .  Melatonin 10 MG CAPS, Take 30 mg by mouth at bedtime as needed (sleep). , Disp: , Rfl:  .  metFORMIN (GLUCOPHAGE) 1000 MG tablet, Take 1 tablet (1,000 mg total) by mouth 2 (two) times daily with a meal., Disp: 180 tablet, Rfl: 3 .  metroNIDAZOLE (FLAGYL) 500 MG tablet, Take 1 tablet (500 mg total) by mouth every 8 (eight) hours., Disp: 126 tablet, Rfl: 0 .  mirtazapine (REMERON) 30 MG tablet, Take 1 tablet (30 mg total) by mouth at bedtime., Disp: 90 tablet, Rfl: 3 .  montelukast (SINGULAIR) 10 MG tablet, Take 1 tablet (10 mg total) by mouth at bedtime. (Patient taking differently: Take 10 mg by mouth daily. ), Disp: 90 tablet, Rfl: 3 .  nitroGLYCERIN (NITRODUR - DOSED IN MG/24  HR) 0.2 mg/hr patch, Place 1 patch (0.2 mg total) onto the skin daily. (Patient taking differently: Place 0.1 mg onto the skin daily. ), Disp: 30 patch, Rfl: 12 .  oxyCODONE-acetaminophen (PERCOCET/ROXICET) 5-325 MG tablet, Take 1 tablet by mouth every 6 (six) hours as needed for moderate pain., Disp: 20 tablet, Rfl: 0 .  pentoxifylline (TRENTAL) 400 MG CR  tablet, Take 1 tablet (400 mg total) by mouth 3 (three) times daily with meals., Disp: 90 tablet, Rfl: 3 .  silver sulfADIAZINE (SILVADENE) 1 % cream, Apply 1 application topically daily. Apply to affected area daily plus dry dressing, Disp: 400 g, Rfl: 3 .  sitaGLIPtin (JANUVIA) 100 MG tablet, Take 1 tablet (100 mg total) by mouth daily., Disp: 30 tablet, Rfl: 10 .  Valerian Root 450 MG CAPS, Take 900 tablets by mouth at bedtime. , Disp: , Rfl:  .  vancomycin HCl (VANCOREADY) 1500 MG/300ML SOLN, Inject 300 mLs (1,500 mg total) into the vein every 12 (twelve) hours., Disp: 4000 mL, Rfl: 1 .  vancomycin IVPB, Inject 1,500 mg into the vein every 12 (twelve) hours. Via PICC Line. Indication:  Presumed osteomyelitis of right foot First Dose: yes Last Day of Therapy:  02/22/2020 Labs - Sunday/Monday:  CBC/D, BMP, and vancomycin trough. Labs - Thursday:  BMP and vancomycin trough Labs - Every other week:  ESR and CRP Method of administration:Elastomeric Method of administration may be changed at the discretion of the patient and/or caregiver's ability to self-administer the medication ordered., Disp: 84 Units, Rfl: 0   Review of Systems  Constitutional: Negative for activity change, appetite change, chills, diaphoresis, fatigue, fever and unexpected weight change.  HENT: Negative for congestion, rhinorrhea, sinus pressure, sneezing, sore throat and trouble swallowing.   Eyes: Negative for photophobia and visual disturbance.  Respiratory: Negative for cough, chest tightness, shortness of breath, wheezing and stridor.   Cardiovascular: Negative for chest pain, palpitations and leg swelling.  Gastrointestinal: Negative for abdominal distention, abdominal pain, anal bleeding, blood in stool, constipation, diarrhea, nausea and vomiting.  Genitourinary: Negative for difficulty urinating, dysuria, flank pain and hematuria.  Musculoskeletal: Positive for myalgias. Negative for arthralgias, back pain, gait problem  and joint swelling.  Skin: Positive for wound. Negative for color change, pallor and rash.  Neurological: Negative for dizziness, tremors, weakness and light-headedness.  Hematological: Negative for adenopathy. Does not bruise/bleed easily.  Psychiatric/Behavioral: Negative for agitation, behavioral problems, confusion, decreased concentration, dysphoric mood and sleep disturbance.       Objective:   Physical Exam Constitutional:      Appearance: He is well-developed.  HENT:     Head: Normocephalic and atraumatic.  Eyes:     Conjunctiva/sclera: Conjunctivae normal.  Cardiovascular:     Rate and Rhythm: Normal rate and regular rhythm.  Pulmonary:     Effort: Pulmonary effort is normal. No respiratory distress.     Breath sounds: No wheezing.  Abdominal:     General: There is no distension.     Palpations: Abdomen is soft.  Musculoskeletal:        General: Tenderness present. Normal range of motion.     Cervical back: Normal range of motion and neck supple.  Skin:    General: Skin is warm and dry.     Coloration: Skin is not pale.     Findings: No erythema or rash.  Neurological:     General: No focal deficit present.     Mental Status: He is alert and oriented to person, place, and  time.  Psychiatric:        Mood and Affect: Mood normal.        Behavior: Behavior normal.        Thought Content: Thought content normal.        Judgment: Judgment normal.    PICC line is clean dry and intact.  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  He does have pain near the site though and is anxious about the possibility that he has residual osteomyelitis.  I have ordered MRI with contrast keeping in mind it may be difficult to  understand and distinguish postoperative changes from residual osteomyelitis.  We will have him keep the PICC line in place until we have obtained the MRI in case he is in need of further surgery and further antibiotics.  Hopefully MRI is reassuring and we can get rid of his PICC line once he finishes his antibiotics and the MRI is complete  For Covid vaccination we have counseled him to get Covid vaccinated.  He was concerned about having it done while his infection being treated but I think there is no reason he should not get this done as soon as possible

## 2020-02-14 DIAGNOSIS — E1169 Type 2 diabetes mellitus with other specified complication: Secondary | ICD-10-CM | POA: Diagnosis not present

## 2020-02-14 DIAGNOSIS — M869 Osteomyelitis, unspecified: Secondary | ICD-10-CM | POA: Diagnosis not present

## 2020-02-14 DIAGNOSIS — Z5181 Encounter for therapeutic drug level monitoring: Secondary | ICD-10-CM | POA: Diagnosis not present

## 2020-02-14 DIAGNOSIS — L02611 Cutaneous abscess of right foot: Secondary | ICD-10-CM | POA: Diagnosis not present

## 2020-02-14 DIAGNOSIS — T8743 Infection of amputation stump, right lower extremity: Secondary | ICD-10-CM | POA: Diagnosis not present

## 2020-02-14 DIAGNOSIS — B9562 Methicillin resistant Staphylococcus aureus infection as the cause of diseases classified elsewhere: Secondary | ICD-10-CM | POA: Diagnosis not present

## 2020-02-16 ENCOUNTER — Encounter: Payer: Self-pay | Admitting: Infectious Disease

## 2020-02-16 DIAGNOSIS — M869 Osteomyelitis, unspecified: Secondary | ICD-10-CM | POA: Diagnosis not present

## 2020-02-16 DIAGNOSIS — T8743 Infection of amputation stump, right lower extremity: Secondary | ICD-10-CM | POA: Diagnosis not present

## 2020-02-16 DIAGNOSIS — B9562 Methicillin resistant Staphylococcus aureus infection as the cause of diseases classified elsewhere: Secondary | ICD-10-CM | POA: Diagnosis not present

## 2020-02-16 DIAGNOSIS — L02611 Cutaneous abscess of right foot: Secondary | ICD-10-CM | POA: Diagnosis not present

## 2020-02-16 DIAGNOSIS — E1169 Type 2 diabetes mellitus with other specified complication: Secondary | ICD-10-CM | POA: Diagnosis not present

## 2020-02-17 DIAGNOSIS — L02611 Cutaneous abscess of right foot: Secondary | ICD-10-CM | POA: Diagnosis not present

## 2020-02-17 DIAGNOSIS — S98011A Complete traumatic amputation of right foot at ankle level, initial encounter: Secondary | ICD-10-CM | POA: Diagnosis not present

## 2020-02-19 DIAGNOSIS — L02611 Cutaneous abscess of right foot: Secondary | ICD-10-CM | POA: Diagnosis not present

## 2020-02-19 DIAGNOSIS — B9562 Methicillin resistant Staphylococcus aureus infection as the cause of diseases classified elsewhere: Secondary | ICD-10-CM | POA: Diagnosis not present

## 2020-02-19 DIAGNOSIS — T8743 Infection of amputation stump, right lower extremity: Secondary | ICD-10-CM | POA: Diagnosis not present

## 2020-02-19 DIAGNOSIS — E1169 Type 2 diabetes mellitus with other specified complication: Secondary | ICD-10-CM | POA: Diagnosis not present

## 2020-02-19 DIAGNOSIS — M869 Osteomyelitis, unspecified: Secondary | ICD-10-CM | POA: Diagnosis not present

## 2020-02-20 ENCOUNTER — Telehealth: Payer: Self-pay

## 2020-02-20 NOTE — Telephone Encounter (Signed)
Patient called office today requesting prescription for anxiety. Is concerned about appointment he has tomorrow at 7 am for MRI. Was told Dr. Tommy Medal could send in something to help his nerves.  Will forward message to MD to advise on prescription. Requested patient call Radiology and reschedule if he does not hear from office or pharmacy. Patient is using [facility] [place]

## 2020-02-20 NOTE — Telephone Encounter (Addendum)
Patient called office to follow up on concerns with MRI and medication for anxiety. Would like to update MD that he will go to MRI even if he does not get prescription Christopher Fernandez, Christopher Fernandez

## 2020-02-21 ENCOUNTER — Other Ambulatory Visit: Payer: Self-pay

## 2020-02-21 ENCOUNTER — Telehealth: Payer: Self-pay

## 2020-02-21 ENCOUNTER — Ambulatory Visit (HOSPITAL_COMMUNITY)
Admission: RE | Admit: 2020-02-21 | Discharge: 2020-02-21 | Disposition: A | Discharge status: Home / Self Care | Payer: 59 | Source: Ambulatory Visit | Attending: Infectious Disease | Admitting: Infectious Disease

## 2020-02-21 DIAGNOSIS — T8743 Infection of amputation stump, right lower extremity: Secondary | ICD-10-CM | POA: Diagnosis not present

## 2020-02-21 DIAGNOSIS — M869 Osteomyelitis, unspecified: Secondary | ICD-10-CM | POA: Diagnosis not present

## 2020-02-21 DIAGNOSIS — L02611 Cutaneous abscess of right foot: Secondary | ICD-10-CM | POA: Diagnosis not present

## 2020-02-21 DIAGNOSIS — E1169 Type 2 diabetes mellitus with other specified complication: Secondary | ICD-10-CM | POA: Diagnosis not present

## 2020-02-21 DIAGNOSIS — M8600 Acute hematogenous osteomyelitis, unspecified site: Secondary | ICD-10-CM | POA: Insufficient documentation

## 2020-02-21 DIAGNOSIS — M86171 Other acute osteomyelitis, right ankle and foot: Secondary | ICD-10-CM | POA: Diagnosis not present

## 2020-02-21 DIAGNOSIS — B9562 Methicillin resistant Staphylococcus aureus infection as the cause of diseases classified elsewhere: Secondary | ICD-10-CM | POA: Diagnosis not present

## 2020-02-21 MED ORDER — GADOBUTROL 1 MMOL/ML IV SOLN
10.0000 mL | Freq: Once | INTRAVENOUS | Status: AC | PRN
  Administered 2020-02-21: 10 mL via INTRAVENOUS

## 2020-02-21 NOTE — Telephone Encounter (Signed)
Ricardo called to state that patient has complication of amputation  With a small opining of 0.4cm. Pt is on wound vac and antibiotics. Said that she wanted [place] to know that she is currently packing the area and it looks ok.   Spoke with PA's in office and they said that the opening is small and unless it gets larger or looks inflamed that she will be fine and asked that they continue to monitor.   Left message advising.   Leanor Voris Mindi Junker

## 2020-02-21 NOTE — Telephone Encounter (Signed)
I would be ok to give him 1 mg of ativan but it may be too late at this point. Looks like he is already getting the mri?

## 2020-02-22 ENCOUNTER — Telehealth: Payer: Self-pay

## 2020-02-22 NOTE — Telephone Encounter (Signed)
Sheritta, [facility] RN, calls nruse line requesting verbal orders to continue wound treatment as follows:   Every Monday, Wednesday, Friday for the next 4 weeks.   You may leave VO on secure VM [phone].

## 2020-02-22 NOTE — Telephone Encounter (Signed)
[facility] nurse called stating pt has finished his 6 weeks of antibiotics and asking if he still needs wound vac. Pt is f/u in office this Tuesday. Per PA, pt is to continue with wound vac and at next week's appt they will determine if he can d/c wound vac. She verbalized understanding.

## 2020-02-22 NOTE — Telephone Encounter (Signed)
Please provide verbal orders as described above. Thank you!

## 2020-02-23 ENCOUNTER — Telehealth: Payer: Self-pay

## 2020-02-23 DIAGNOSIS — L02611 Cutaneous abscess of right foot: Secondary | ICD-10-CM | POA: Diagnosis not present

## 2020-02-23 DIAGNOSIS — B9562 Methicillin resistant Staphylococcus aureus infection as the cause of diseases classified elsewhere: Secondary | ICD-10-CM | POA: Diagnosis not present

## 2020-02-23 DIAGNOSIS — M869 Osteomyelitis, unspecified: Secondary | ICD-10-CM | POA: Diagnosis not present

## 2020-02-23 DIAGNOSIS — T8743 Infection of amputation stump, right lower extremity: Secondary | ICD-10-CM | POA: Diagnosis not present

## 2020-02-23 DIAGNOSIS — E1169 Type 2 diabetes mellitus with other specified complication: Secondary | ICD-10-CM | POA: Diagnosis not present

## 2020-02-23 NOTE — Telephone Encounter (Signed)
Called patient to relay results per Janene Madeira, NP. Patient does not have any questions about results. Understands he will follow up as needed with our office. Will follow up with surgery team as planned.  Called [facility] with orders to pull picc today. Spoke with Jeani Hawking, Pharmacist who was able to accept verbal orders. [place]

## 2020-02-23 NOTE — Telephone Encounter (Signed)
Called [place], [facility] RN and gave verbal orders to continue wound treatment for patient.  Ozella Almond, [place]

## 2020-02-23 NOTE — Telephone Encounter (Signed)
-----  Message from Harmon Callas, NP sent at 02/23/2020 12:25 PM EDT ----- Regarding: FW: Cece can you can home health to have the patients picc line removed?   Would you also mind calling the patient to inform him of the following message:   His MRI shows no deep infection. His blood work looks much better with regards to inflammation from infection. He is released from follow up with [facility] as needed but he should still follow up with his surgery team.   Thank you! ----- Message ----- From: Tommy Medal, Lavell Islam, MD Sent: 02/23/2020  12:04 PM EDT To: [place] Callas, NP  I am ok to DC picc and he can followup with his surgeons if he wants to see me again happy to ----- Message ----- From: [place] Callas, NP Sent: 02/22/2020   4:29 PM EDT To: Truman Hayward, MD  Cleaning up some OPAT stuff -  Are you good with D/C'ing PICC line? ESR is ~50 and CRP 1. Finished his Vanc-Cefepime today. MRI results are back and no osteomyelitis.  I think he still has some open wound issues based on surgery's documentation.   Let me know if you want him to follow up with you again so I can schedule.  ~Steph

## 2020-02-26 DIAGNOSIS — M869 Osteomyelitis, unspecified: Secondary | ICD-10-CM | POA: Diagnosis not present

## 2020-02-26 DIAGNOSIS — L02611 Cutaneous abscess of right foot: Secondary | ICD-10-CM | POA: Diagnosis not present

## 2020-02-26 DIAGNOSIS — T8743 Infection of amputation stump, right lower extremity: Secondary | ICD-10-CM | POA: Diagnosis not present

## 2020-02-26 DIAGNOSIS — B9562 Methicillin resistant Staphylococcus aureus infection as the cause of diseases classified elsewhere: Secondary | ICD-10-CM | POA: Diagnosis not present

## 2020-02-26 DIAGNOSIS — E1169 Type 2 diabetes mellitus with other specified complication: Secondary | ICD-10-CM | POA: Diagnosis not present

## 2020-02-27 ENCOUNTER — Ambulatory Visit (INDEPENDENT_AMBULATORY_CARE_PROVIDER_SITE_OTHER): Payer: Self-pay | Admitting: Physician Assistant

## 2020-02-27 ENCOUNTER — Other Ambulatory Visit: Payer: Self-pay

## 2020-02-27 VITALS — BP 108/73 | HR 82 | Temp 97.0°F | Resp 18 | Ht 75.0 in | Wt 257.0 lb

## 2020-02-27 DIAGNOSIS — Z89431 Acquired absence of right foot: Secondary | ICD-10-CM

## 2020-02-27 DIAGNOSIS — I739 Peripheral vascular disease, unspecified: Secondary | ICD-10-CM

## 2020-02-27 MED FILL — BASAGLAR 100 UNIT/ML KWIKPE: 100 | 30 days supply | Qty: 6 | Fill #2

## 2020-02-27 MED FILL — FREESTYLE LITE TEST STRIP: 30 days supply | Qty: 100 | Fill #7

## 2020-02-27 MED FILL — GABAPENTIN 300 MG CAPSULE: 300 | 30 days supply | Qty: 90 | Fill #2

## 2020-02-27 NOTE — Progress Notes (Signed)
POST OPERATIVE OFFICE NOTE    CC:  F/u for surgery  HPI:  This is a 49 y.o. male who is s/p sharp excisional debridement of right transmetatarsal amputation (1.9ER x 7 cm) & application of negative pressure wound VAC to right transmetatarsal amputation on 01/10/2020 by Dr. Carlis Abbott.  He received 6 weeks of IV Vanc and cefepime via PICC line and oral Flagyl, which he completed on 02/23/20  He was last seen on 02/06/20. Was not having any pain. Wound looked good with granulation tissue.  Today he continues to not have any pain in foot or leg. There is one spot of tenderness on the plantar aspect. He says he does not do a lot of ambulation but able to do his ADL's with darco shoe. Home Health care  RN has been coming out to change dressing MWF.  Allergies  Allergen Reactions  . Eggs Or Egg-Derived Products Rash  . Morphine And Related Other (See Comments)    Cant take because of pancreatitis  . Cocoa Rash    Current Outpatient Medications  Medication Sig Dispense Refill  . albuterol (VENTOLIN HFA) 108 (90 Base) MCG/ACT inhaler Inhale 2-4 puffs into the lungs every 4 (four) hours as needed for wheezing (or cough). (Patient taking differently: Inhale 2 puffs into the lungs every 4 (four) hours as needed for wheezing (or cough). ) 6.7 g 11  . ARIPiprazole (ABILIFY) 5 MG tablet Take 1 tablet (5 mg total) by mouth daily. 90 tablet 2  . atorvastatin (LIPITOR) 40 MG tablet Take 1 tablet (40 mg total) by mouth daily. 90 tablet 3  . bismuth subsalicylate (PEPTO BISMOL) 262 MG/15ML suspension Take 30 mLs by mouth every 6 (six) hours as needed for indigestion or diarrhea or loose stools.    . Continuous Blood Gluc Sensor (FREESTYLE LIBRE 14 DAY SENSOR) MISC Use one sensor every 14 days 6 each 6  . diphenhydrAMINE (BENADRYL) 25 MG tablet Take 25 mg by mouth every 6 (six) hours as needed for itching or allergies.     . DULoxetine (CYMBALTA) 60 MG capsule Take 1 capsule (60 mg total) by mouth daily. 90  capsule 2  . EASY TOUCH PEN NEEDLES 31G X 5 MM MISC Inject 1 application as directed 3 (three) times daily. 100 each 11  . empagliflozin (JARDIANCE) 25 MG TABS tablet Take 25 mg by mouth daily. 90 tablet 3  . fluticasone (FLONASE) 50 MCG/ACT nasal spray Place 1 spray into both nostrils daily as needed for allergies or rhinitis.    . Fluticasone-Salmeterol (ADVAIR DISKUS) 250-50 MCG/DOSE AEPB Inhale 1 puff into the lungs 2 (two) times daily. 180 each 3  . gabapentin (NEURONTIN) 300 MG capsule Take 1 capsule (300 mg total) by mouth 3 (three) times daily. 90 capsule 2  . glucose blood (FREESTYLE LITE) test strip Use as instructed 100 each 12  . ibuprofen (ADVIL) 200 MG tablet Take 800 mg by mouth every 8 (eight) hours as needed for headache or moderate pain.     . Insulin Glargine (BASAGLAR KWIKPEN) 100 UNIT/ML Inject 0.2 mLs (20 Units total) into the skin in the morning. 9 mL 2  . insulin lispro (HUMALOG KWIKPEN) 100 UNIT/ML KwikPen Inject 0-0.1 mLs (0-10 Units total) into the skin 3 (three) times daily. (Patient taking differently: Inject 0-10 Units into the skin 3 (three) times daily with meals. Sliding scale) 12 mL 0  . Lancets (FREESTYLE) lancets Use as instructed 100 each 12  . Melatonin 10 MG CAPS Take 30  mg by mouth at bedtime as needed (sleep).     . metFORMIN (GLUCOPHAGE) 1000 MG tablet Take 1 tablet (1,000 mg total) by mouth 2 (two) times daily with a meal. 180 tablet 3  . metroNIDAZOLE (FLAGYL) 500 MG tablet Take 1 tablet (500 mg total) by mouth every 8 (eight) hours. 126 tablet 0  . mirtazapine (REMERON) 30 MG tablet Take 1 tablet (30 mg total) by mouth at bedtime. 90 tablet 3  . montelukast (SINGULAIR) 10 MG tablet Take 1 tablet (10 mg total) by mouth at bedtime. (Patient taking differently: Take 10 mg by mouth daily. ) 90 tablet 3  . nitroGLYCERIN (NITRODUR - DOSED IN MG/24 HR) 0.2 mg/hr patch Place 1 patch (0.2 mg total) onto the skin daily. (Patient taking differently: Place 0.1 mg onto  the skin daily. ) 30 patch 12  . oxyCODONE-acetaminophen (PERCOCET/ROXICET) 5-325 MG tablet Take 1 tablet by mouth every 6 (six) hours as needed for moderate pain. 20 tablet 0  . pentoxifylline (TRENTAL) 400 MG CR tablet Take 1 tablet (400 mg total) by mouth 3 (three) times daily with meals. 90 tablet 3  . silver sulfADIAZINE (SILVADENE) 1 % cream Apply 1 application topically daily. Apply to affected area daily plus dry dressing 400 g 3  . sitaGLIPtin (JANUVIA) 100 MG tablet Take 1 tablet (100 mg total) by mouth daily. 30 tablet 10  . Valerian Root 450 MG CAPS Take 900 tablets by mouth at bedtime.      No current facility-administered medications for this visit.     ROS:  See HPI  Physical Exam:  Vitals:   02/27/20 1407  BP: 108/73  Pulse: 82  Resp: 18  Temp: (!) 97 F (36.1 C)  TempSrc: Temporal  SpO2: 97%  Weight: 257 lb (116.6 kg)  Height: 6\' 3"  (1.905 m)   General: well appearing, well nourished, not in any distress Lungs: non labored Extremities:  Right TMA site with good granulation tissue in wound bed. Wound measures approximately 5 cm x 3 cm. There does not appear to be any tunneling. There is epidermolysis of the plantar aspect of wound. I applied wet- to dry dressing Neuro: alert and oriented  Assessment/Plan:  This is a 49 y.o. male who is s/p excisional debridement of right TMA 01/10/20 by Dr. Carlis Abbott for nonhealing right TMA with Wound VAC application. Minimal change in appearance of wound since his last visit. Clean granulation tissue in wound bed. Some epidermolysis of plantar skin. Wound today was dressed with wet to dry dressing. Wound care will come out to apply new wound VAC tomorrow. Recommend continued Wound VAC for next 2-3 weeks. Patient uses [facility] for wound care.Will have him follow up in 2-3 weeks for another wound check   Karoline Caldwell, PA-C [facility] [phone]  Clinic MD:  Dr. Carlis Abbott

## 2020-02-28 ENCOUNTER — Other Ambulatory Visit: Payer: Self-pay

## 2020-02-28 DIAGNOSIS — B9562 Methicillin resistant Staphylococcus aureus infection as the cause of diseases classified elsewhere: Secondary | ICD-10-CM | POA: Diagnosis not present

## 2020-02-28 DIAGNOSIS — T8189XA Other complications of procedures, not elsewhere classified, initial encounter: Secondary | ICD-10-CM | POA: Diagnosis not present

## 2020-02-28 DIAGNOSIS — M869 Osteomyelitis, unspecified: Secondary | ICD-10-CM | POA: Diagnosis not present

## 2020-02-28 DIAGNOSIS — E1165 Type 2 diabetes mellitus with hyperglycemia: Secondary | ICD-10-CM

## 2020-02-28 DIAGNOSIS — Z794 Long term (current) use of insulin: Secondary | ICD-10-CM

## 2020-02-28 DIAGNOSIS — L02611 Cutaneous abscess of right foot: Secondary | ICD-10-CM | POA: Diagnosis not present

## 2020-02-28 DIAGNOSIS — T8743 Infection of amputation stump, right lower extremity: Secondary | ICD-10-CM | POA: Diagnosis not present

## 2020-02-28 DIAGNOSIS — E1169 Type 2 diabetes mellitus with other specified complication: Secondary | ICD-10-CM | POA: Diagnosis not present

## 2020-02-29 ENCOUNTER — Encounter: Payer: Self-pay | Admitting: Family Medicine

## 2020-02-29 MED ORDER — INSULIN LISPRO 100 UNIT/ML ~~LOC~~ SOLN: 0.0000 [IU] | Freq: Three times a day (TID) | SUBCUTANEOUS | 2 refills | Status: DC

## 2020-03-01 DIAGNOSIS — M869 Osteomyelitis, unspecified: Secondary | ICD-10-CM | POA: Diagnosis not present

## 2020-03-01 DIAGNOSIS — T8743 Infection of amputation stump, right lower extremity: Secondary | ICD-10-CM | POA: Diagnosis not present

## 2020-03-01 DIAGNOSIS — E1169 Type 2 diabetes mellitus with other specified complication: Secondary | ICD-10-CM | POA: Diagnosis not present

## 2020-03-01 DIAGNOSIS — B9562 Methicillin resistant Staphylococcus aureus infection as the cause of diseases classified elsewhere: Secondary | ICD-10-CM | POA: Diagnosis not present

## 2020-03-01 DIAGNOSIS — L02611 Cutaneous abscess of right foot: Secondary | ICD-10-CM | POA: Diagnosis not present

## 2020-03-01 MED FILL — HUMALOG 100 UNITS/ML KWIKPE: 100 | 30 days supply | Qty: 9 | Fill #0

## 2020-03-04 DIAGNOSIS — T8743 Infection of amputation stump, right lower extremity: Secondary | ICD-10-CM | POA: Diagnosis not present

## 2020-03-04 DIAGNOSIS — B9562 Methicillin resistant Staphylococcus aureus infection as the cause of diseases classified elsewhere: Secondary | ICD-10-CM | POA: Diagnosis not present

## 2020-03-04 DIAGNOSIS — L02611 Cutaneous abscess of right foot: Secondary | ICD-10-CM | POA: Diagnosis not present

## 2020-03-04 DIAGNOSIS — M869 Osteomyelitis, unspecified: Secondary | ICD-10-CM | POA: Diagnosis not present

## 2020-03-04 DIAGNOSIS — T8149XA Infection following a procedure, other surgical site, initial encounter: Secondary | ICD-10-CM | POA: Diagnosis not present

## 2020-03-04 DIAGNOSIS — E1169 Type 2 diabetes mellitus with other specified complication: Secondary | ICD-10-CM | POA: Diagnosis not present

## 2020-03-06 DIAGNOSIS — L02611 Cutaneous abscess of right foot: Secondary | ICD-10-CM | POA: Diagnosis not present

## 2020-03-06 DIAGNOSIS — T8743 Infection of amputation stump, right lower extremity: Secondary | ICD-10-CM | POA: Diagnosis not present

## 2020-03-06 DIAGNOSIS — E1169 Type 2 diabetes mellitus with other specified complication: Secondary | ICD-10-CM | POA: Diagnosis not present

## 2020-03-06 DIAGNOSIS — M869 Osteomyelitis, unspecified: Secondary | ICD-10-CM | POA: Diagnosis not present

## 2020-03-06 DIAGNOSIS — B9562 Methicillin resistant Staphylococcus aureus infection as the cause of diseases classified elsewhere: Secondary | ICD-10-CM | POA: Diagnosis not present

## 2020-03-06 MED FILL — metFORMIN HCL 1000 MG TABS: 1000 | 90 days supply | Qty: 180 | Fill #3

## 2020-03-08 DIAGNOSIS — E1169 Type 2 diabetes mellitus with other specified complication: Secondary | ICD-10-CM | POA: Diagnosis not present

## 2020-03-08 DIAGNOSIS — M869 Osteomyelitis, unspecified: Secondary | ICD-10-CM | POA: Diagnosis not present

## 2020-03-08 DIAGNOSIS — T8743 Infection of amputation stump, right lower extremity: Secondary | ICD-10-CM | POA: Diagnosis not present

## 2020-03-08 DIAGNOSIS — L02611 Cutaneous abscess of right foot: Secondary | ICD-10-CM | POA: Diagnosis not present

## 2020-03-08 DIAGNOSIS — B9562 Methicillin resistant Staphylococcus aureus infection as the cause of diseases classified elsewhere: Secondary | ICD-10-CM | POA: Diagnosis not present

## 2020-03-11 DIAGNOSIS — T8743 Infection of amputation stump, right lower extremity: Secondary | ICD-10-CM | POA: Diagnosis not present

## 2020-03-11 DIAGNOSIS — E1169 Type 2 diabetes mellitus with other specified complication: Secondary | ICD-10-CM | POA: Diagnosis not present

## 2020-03-11 DIAGNOSIS — B9562 Methicillin resistant Staphylococcus aureus infection as the cause of diseases classified elsewhere: Secondary | ICD-10-CM | POA: Diagnosis not present

## 2020-03-11 DIAGNOSIS — M869 Osteomyelitis, unspecified: Secondary | ICD-10-CM | POA: Diagnosis not present

## 2020-03-11 DIAGNOSIS — L02611 Cutaneous abscess of right foot: Secondary | ICD-10-CM | POA: Diagnosis not present

## 2020-03-12 ENCOUNTER — Encounter: Payer: Self-pay | Admitting: Family Medicine

## 2020-03-12 ENCOUNTER — Ambulatory Visit: Payer: 59 | Admitting: Family Medicine

## 2020-03-12 ENCOUNTER — Other Ambulatory Visit: Payer: Self-pay

## 2020-03-12 VITALS — BP 100/62 | HR 91 | Wt 250.0 lb

## 2020-03-12 DIAGNOSIS — F1020 Alcohol dependence, uncomplicated: Secondary | ICD-10-CM

## 2020-03-12 DIAGNOSIS — F17200 Nicotine dependence, unspecified, uncomplicated: Secondary | ICD-10-CM

## 2020-03-12 DIAGNOSIS — T8189XA Other complications of procedures, not elsewhere classified, initial encounter: Secondary | ICD-10-CM | POA: Diagnosis not present

## 2020-03-12 DIAGNOSIS — E1165 Type 2 diabetes mellitus with hyperglycemia: Secondary | ICD-10-CM | POA: Diagnosis not present

## 2020-03-12 DIAGNOSIS — E785 Hyperlipidemia, unspecified: Secondary | ICD-10-CM | POA: Diagnosis not present

## 2020-03-12 DIAGNOSIS — Z1159 Encounter for screening for other viral diseases: Secondary | ICD-10-CM | POA: Diagnosis not present

## 2020-03-12 DIAGNOSIS — E1169 Type 2 diabetes mellitus with other specified complication: Secondary | ICD-10-CM

## 2020-03-12 DIAGNOSIS — Z794 Long term (current) use of insulin: Secondary | ICD-10-CM

## 2020-03-12 LAB — POCT GLYCOSYLATED HEMOGLOBIN (HGB A1C): HbA1c, POC (controlled diabetic range): 8.2 % — AB (ref 0.0–7.0)

## 2020-03-12 MED FILL — MIRTAZAPINE 30 MG TABLET: 30 | 90 days supply | Qty: 90 | Fill #1

## 2020-03-12 NOTE — Progress Notes (Signed)
Subjective:   Patient ID: Christopher Fernandez    DOB: December 29, 1970, 49 y.o. male   MRN: [id number]  Christopher Fernandez is a 49 y.o. male with a history of intermittent asthma, GERD, exocrine pancreatic insufficiency, uncontrolled DM, alcohol and tobacco use disorder, anxiety/depression, anemia, asplenia, osteomyelitis s/p amputation of midfoot w/ h/o serratia and MRSA infection here for diabetes check up.   Uncontrolled Diabetes: Last three A1C's below. Currently on Jardiance 25mg  QD, Metformin 1000mg  BID,  Januvia 100mg  QD, Basaglar 20U qAM, Humalog sliding scale (2-4 units) TID. Endorses compliance. Notes CBGs range 140-200. Occasional hypoglycemic episodes of 60 blood sugar, usually secondary to missing a meal. Denies any polyuria, polydipsia, polyphagia. Due for diabetic eye exam and foot exam.  Lab Results  Component Value Date   HGBA1C 8.2 (A) 03/12/2020   HGBA1C 8.4 (H) 01/10/2020   HGBA1C 8.7 (A) 12/12/2019   HLD: Last lipid panel below. Currently on Atorvastatin 40mg  QD. Endorses compliance. Denies any muscles aches or weakness.  Lab Results  Component Value Date   CHOL 136 12/12/2019   HDL 39 (L) 12/12/2019   LDLCALC 71 12/12/2019   LDLDIRECT 55 02/24/2018   TRIG 149 12/12/2019   CHOLHDL 3.5 12/12/2019    Tobacco  Alcohol Abuse: Tobacco History: Smoking 0.5 PPD x 35 years, used to smoke 1-2 PPD x 10-12 years. Not interested in quitting  Alcohol Use: History of alcoholism. Occasional drinker but drinks 3-4 double shots of vodka. He knows he is on a slippery slope.   Health Maintenance: Due for Hep C screening, diabetic eye exam, COVID-19 vaccine, colonoscopy, and diabetic foot exam  Review of Systems:  Per HPI.   Objective:   BP 100/62   Pulse 91   Wt 250 lb (113.4 kg)   SpO2 93%   BMI 31.25 kg/m  Vitals and nursing note reviewed.  General: pleasant older male, sitting comfortably in exam chair, well nourished, well developed, in no acute distress with non-toxic  appearance CV: regular rate and rhythm without murmurs, rubs, or gallops, no lower extremity edema Lungs: clear to auscultation bilaterally with normal work of breathing Abdomen: soft, non-tender, non-distended, normoactive bowel sounds Skin: warm, dry, well healing mid-foot amputation with wound vac in place, bandage clean and dry Extremities: warm and well perfused MSK: slight antalgic gait secondary to healing mid-foot wound Neuro: Alert and oriented, speech normal  Diabetic foot exam was performed with the following findings:   Intact posterior tibialis and dorsalis pedis pulses Right foot s/p mid-foot amputation - well appearing with wound vac in place  Decreased sensation to distal dorsal and plantar foot bilaterally     Assessment & Plan:   Uncontrolled diabetes mellitus with hyperglycemia, with long-term current use of insulin (HCC) Chronic, uncontrolled but improving.  We will continue current medications at this time given improvement.  Considered increasing long-acting insulin due to elevated morning blood sugars, however endorsement of hypoglycemic episodes are concerning.  - Continue Jardiance 25mg  QD, Metformin 1000mg  BID,  Januvia 100mg  QD, Basaglar 20U qAM, Humalog sliding scale (2-4 units) TID -  Plan to follow-up in 3 months for repeat A1c.   - Diabetic foot exam completed today with decreased sensation in the distal feet bilaterally.  Discussed importance of daily foot care to prevent future infections.   - Patient plans to schedule diabetic eye exam at earliest convenience - plan to help patient obtain CGM device for better and easier management of diabetes 4 insulin injections a day and need for  more frequent CBG monitoring - Referral to Diabetes and Nutrition clinic for CGM training  Hyperlipidemia associated with type 2 diabetes mellitus ([place]) Well-controlled with atorvastatin 40 mg daily.  Continue current medications.  Tobacco use disorder Smoking 0.5 PPD x 25  years, used to smoke 1-2 PPD x 10-12 years. Pack Years ~25. Not interested in quitting. Patient does qualify for lung cancer screen at this time. Patient was counseled on the risks of tobacco use and cessation strongly encouraged. - will discuss obtaining low dose CT at follow up visit in 3 months  Alcohol use disorder, severe, dependence ([name]) History of alcoholism. Currently drinking 2-3 shots of liquor occasionally. Voiced concern and cautioned him with his history and current use. Patient is interested in starting something to help with the cravings. - Start Naltrexone 50mg  QD. If not on formulary then can consider Acamprosate 666mg  TID. - Liver and kidney function WNL.Negative Hepatitis B, C, and HIV. Not currently on opioids. No history of liver disease. - will plan for repeat LFP in 3 months to monitor   Health Maintenance: Colonoscopy postponed due to healing foot wound. GI plans to call patient abck in July to reschedule. Plans to get COVID vaccine tomorrow Plans to call and schedule exam for diabetic eye exam  Orders Placed This Encounter  Procedures  . Hepatitis C Antibody  . Hepatitis B surface antigen  . Comprehensive metabolic panel  . POCT glycosylated hemoglobin (Hb A1C)   No orders of the defined types were placed in this encounter.  Mina Marble, DO PGY-2, [facility] 03/15/2020 8:19 AM

## 2020-03-12 NOTE — Patient Instructions (Signed)
Thank you for coming to see me today. It was a pleasure to see you.   Your A1c is improving slowly!  Congratulations on your hard work!  We will continue medications as they are and plan to follow-up in 3 months for repeat A1c.  I will talk to Dr. Valentina Lucks in regards to the [facility] and let you know what he says.  In regards to your alcohol use, I will obtain some labs today to evaluate your liver function and call you with recommendations on the medication to take.  We are checking some labs today, I will call you if they are abnormal will send you a MyChart message or a letter if they are normal.  If you do not hear about your labs in the next 2 weeks please let us know.  Please follow-up with me in 3 months for diabetes follow up.  If you have any questions or concerns, please do not hesitate to call the office at [phone].  Take Care,  Dr. Mina Marble, DO Resident Physician [place] [phone]

## 2020-03-13 ENCOUNTER — Ambulatory Visit (INDEPENDENT_AMBULATORY_CARE_PROVIDER_SITE_OTHER): Payer: 59 | Admitting: Physician Assistant

## 2020-03-13 VITALS — BP 122/82 | HR 103 | Resp 16 | Ht 75.0 in | Wt 255.0 lb

## 2020-03-13 DIAGNOSIS — M869 Osteomyelitis, unspecified: Secondary | ICD-10-CM | POA: Diagnosis not present

## 2020-03-13 DIAGNOSIS — Z89431 Acquired absence of right foot: Secondary | ICD-10-CM | POA: Diagnosis not present

## 2020-03-13 DIAGNOSIS — L02611 Cutaneous abscess of right foot: Secondary | ICD-10-CM | POA: Diagnosis not present

## 2020-03-13 DIAGNOSIS — E1169 Type 2 diabetes mellitus with other specified complication: Secondary | ICD-10-CM | POA: Diagnosis not present

## 2020-03-13 DIAGNOSIS — T8743 Infection of amputation stump, right lower extremity: Secondary | ICD-10-CM | POA: Diagnosis not present

## 2020-03-13 DIAGNOSIS — B9562 Methicillin resistant Staphylococcus aureus infection as the cause of diseases classified elsewhere: Secondary | ICD-10-CM | POA: Diagnosis not present

## 2020-03-13 LAB — COMPREHENSIVE METABOLIC PANEL
ALT: 18 IU/L (ref 0–44)
AST: 20 IU/L (ref 0–40)
Albumin/Globulin Ratio: 1.9 (ref 1.2–2.2)
Albumin: 4.7 g/dL (ref 3.8–4.9)
Alkaline Phosphatase: 88 IU/L (ref 48–121)
BUN/Creatinine Ratio: 9 (ref 9–20)
BUN: 8 mg/dL (ref 6–24)
Bilirubin Total: 0.3 mg/dL (ref 0.0–1.2)
CO2: 22 mmol/L (ref 20–29)
Calcium: 9.4 mg/dL (ref 8.7–10.2)
Chloride: 99 mmol/L (ref 96–106)
Creatinine, Ser: 0.87 mg/dL (ref 0.76–1.27)
GFR calc Af Amer: 116 mL/min/{1.73_m2} (ref >59)
GFR calc non Af Amer: 100 mL/min/{1.73_m2} (ref >59)
Globulin, Total: 2.5 g/dL (ref 1.5–4.5)
Glucose: 128 mg/dL — ABNORMAL HIGH (ref 65–99)
Potassium: 4.6 mmol/L (ref 3.5–5.2)
Sodium: 139 mmol/L (ref 134–144)
Total Protein: 7.2 g/dL (ref 6.0–8.5)

## 2020-03-13 LAB — HEPATITIS B SURFACE ANTIGEN: Hepatitis B Surface Ag: NEGATIVE

## 2020-03-13 LAB — HEPATITIS C ANTIBODY: Hep C Virus Ab: <0.1 s/co ratio (ref 0.0–0.9)

## 2020-03-13 NOTE — Progress Notes (Signed)
POST OPERATIVE OFFICE NOTE    CC:  F/u for surgery; wound VAC to right TMA site  HPI:  This is a 49 y.o. male who is s/p sharp excisional debridement of right transmetatarsal amputation (2.6ST x 7 cm) & application of negative pressure wound VAC to right transmetatarsal amputation on 01/10/2020 by Dr. Carlis Abbott.  In the hospital, Intraoperative wound cultures were obtained. Intravenous antibiotics were initiated. Infectious disease consultation was obtained and due to his previous positive wound cultures, IV antibiotics for 6 weeks was recommended. A PICC line was placed. He was placed on 6 weeks of Vanc and cefepime for 6 weeks and oral Flagyl was added for possible anerobes.    He continues to do well.  Wound VAC dressing changes on Mondays, Wednesdays and Fridays.  He denies fever or chills.  Heel bearing with Darco shoe in place  Allergies  Allergen Reactions  . Eggs Or Egg-Derived Products Rash  . Morphine And Related Other (See Comments)    Cant take because of pancreatitis  . Cocoa Rash    Current Outpatient Medications  Medication Sig Dispense Refill  . albuterol (VENTOLIN HFA) 108 (90 Base) MCG/ACT inhaler Inhale 2-4 puffs into the lungs every 4 (four) hours as needed for wheezing (or cough). (Patient taking differently: Inhale 2 puffs into the lungs every 4 (four) hours as needed for wheezing (or cough). ) 6.7 g 11  . ARIPiprazole (ABILIFY) 5 MG tablet Take 1 tablet (5 mg total) by mouth daily. 90 tablet 2  . atorvastatin (LIPITOR) 40 MG tablet Take 1 tablet (40 mg total) by mouth daily. 90 tablet 3  . bismuth subsalicylate (PEPTO BISMOL) 262 MG/15ML suspension Take 30 mLs by mouth every 6 (six) hours as needed for indigestion or diarrhea or loose stools.    . Continuous Blood Gluc Sensor (FREESTYLE LIBRE 14 DAY SENSOR) MISC Use one sensor every 14 days 6 each 6  . diphenhydrAMINE (BENADRYL) 25 MG tablet Take 25 mg by mouth every 6 (six) hours as needed for itching or allergies.      . DULoxetine (CYMBALTA) 60 MG capsule Take 1 capsule (60 mg total) by mouth daily. 90 capsule 2  . EASY TOUCH PEN NEEDLES 31G X 5 MM MISC Inject 1 application as directed 3 (three) times daily. 100 each 11  . empagliflozin (JARDIANCE) 25 MG TABS tablet Take 25 mg by mouth daily. 90 tablet 3  . fluticasone (FLONASE) 50 MCG/ACT nasal spray Place 1 spray into both nostrils daily as needed for allergies or rhinitis.    . Fluticasone-Salmeterol (ADVAIR DISKUS) 250-50 MCG/DOSE AEPB Inhale 1 puff into the lungs 2 (two) times daily. 180 each 3  . gabapentin (NEURONTIN) 300 MG capsule Take 1 capsule (300 mg total) by mouth 3 (three) times daily. 90 capsule 2  . glucose blood (FREESTYLE LITE) test strip Use as instructed 100 each 12  . ibuprofen (ADVIL) 200 MG tablet Take 800 mg by mouth every 8 (eight) hours as needed for headache or moderate pain.     . Insulin Glargine (BASAGLAR KWIKPEN) 100 UNIT/ML Inject 0.2 mLs (20 Units total) into the skin in the morning. 9 mL 2  . insulin lispro (HUMALOG) 100 UNIT/ML injection Inject 0-0.1 mLs (0-10 Units total) into the skin 3 (three) times daily with meals. 10 mL 2  . Lancets (FREESTYLE) lancets Use as instructed 100 each 12  . Melatonin 10 MG CAPS Take 30 mg by mouth at bedtime as needed (sleep).     [name]  metFORMIN (GLUCOPHAGE) 1000 MG tablet Take 1 tablet (1,000 mg total) by mouth 2 (two) times daily with a meal. 180 tablet 3  . metroNIDAZOLE (FLAGYL) 500 MG tablet Take 1 tablet (500 mg total) by mouth every 8 (eight) hours. 126 tablet 0  . mirtazapine (REMERON) 30 MG tablet Take 1 tablet (30 mg total) by mouth at bedtime. 90 tablet 3  . montelukast (SINGULAIR) 10 MG tablet Take 1 tablet (10 mg total) by mouth at bedtime. (Patient taking differently: Take 10 mg by mouth daily. ) 90 tablet 3  . nitroGLYCERIN (NITRODUR - DOSED IN MG/24 HR) 0.2 mg/hr patch Place 1 patch (0.2 mg total) onto the skin daily. (Patient taking differently: Place 0.1 mg onto the skin  daily. ) 30 patch 12  . oxyCODONE-acetaminophen (PERCOCET/ROXICET) 5-325 MG tablet Take 1 tablet by mouth every 6 (six) hours as needed for moderate pain. 20 tablet 0  . pentoxifylline (TRENTAL) 400 MG CR tablet Take 1 tablet (400 mg total) by mouth 3 (three) times daily with meals. 90 tablet 3  . silver sulfADIAZINE (SILVADENE) 1 % cream Apply 1 application topically daily. Apply to affected area daily plus dry dressing 400 g 3  . sitaGLIPtin (JANUVIA) 100 MG tablet Take 1 tablet (100 mg total) by mouth daily. 30 tablet 10  . Valerian Root 450 MG CAPS Take 900 tablets by mouth at bedtime.      No current facility-administered medications for this visit.     ROS:  See HPI  BP 122/82 (BP Location: Left Arm, Patient Position: Standing, Cuff Size: Normal)   Pulse (!) 103   Resp 16   Ht 6\' 3"  (1.905 m)   Wt 255 lb (115.7 kg)   BMI 31.87 kg/m   Physical Exam:  General appearance: in NAD Cardiac:RRR Respiratory:nonlabored Extremities: Right transmetatarsal amputation site is inspected.  Continues to heal nicely.  Good granulation and wound bed. Periwound somewhat macerated.  Wound measures 3.2 cm x 2 cm and no appreciable depth.  Probed with sterile cotton-tipped applicator.  No tunneling or drainage. Neuro: A and O x 4      Assessment/Plan:  This is a 49 y.o. male who is s/p: Right transmetatarsal amputation with poor healing.  He was referred to Dr. Carlis Abbott who took the patient to the operating room where he underwent sharp debridement and wound VAC placement 2 months ago.  His wound continues to heal nicely.  Close to requiring only damp-dry NS dressing. Recommend continuing wound VAC through the end of this month and follow-up in the office with Dr. Carlis Abbott.    Risa Grill, PA-C [facility] [phone]  Clinic MD: Oneida Alar

## 2020-03-14 ENCOUNTER — Ambulatory Visit: Payer: 59 | Attending: Internal Medicine

## 2020-03-14 DIAGNOSIS — Z23 Encounter for immunization: Secondary | ICD-10-CM

## 2020-03-14 NOTE — Progress Notes (Signed)
   Covid-19 Vaccination Clinic  Name:  CLAUDIUS MICH    MRN: [id number] DOB: [birth year]  03/14/2020  Mr. Guglielmo was observed post Covid-19 immunization for 15 minutes without incident. He was provided with Vaccine Information Sheet and instruction to access the V-Safe system.   Mr. Paolini was instructed to call 911 with any severe reactions post vaccine: [name] Difficulty breathing  . Swelling of face and throat  . A fast heartbeat  . A bad rash all over body  . Dizziness and weakness   Immunizations Administered    Name Date Dose VIS Date Route   Pfizer COVID-19 Vaccine 03/14/2020  8:28 AM 0.3 mL 11/08/2018 Intramuscular   Manufacturer: [place]   Lot: [id number]   [place]: [id number]

## 2020-03-15 ENCOUNTER — Other Ambulatory Visit: Payer: Self-pay | Admitting: Family Medicine

## 2020-03-15 DIAGNOSIS — E785 Hyperlipidemia, unspecified: Secondary | ICD-10-CM | POA: Insufficient documentation

## 2020-03-15 DIAGNOSIS — E1169 Type 2 diabetes mellitus with other specified complication: Secondary | ICD-10-CM | POA: Insufficient documentation

## 2020-03-15 MED ORDER — DEXCOM G6 RECEIVER DEVI: 1.0000 | Freq: Four times a day (QID) | 11 refills | Status: AC

## 2020-03-15 MED ORDER — DEXCOM G6 TRANSMITTER MISC: 1.0000 | Freq: Four times a day (QID) | 11 refills | Status: DC

## 2020-03-15 MED ORDER — NALTREXONE HCL 50 MG PO TABS: 50.0000 mg | ORAL_TABLET | Freq: Every day | ORAL | 0 refills | Status: DC

## 2020-03-15 MED ORDER — DEXCOM G6 SENSOR MISC: 1.0000 | Freq: Four times a day (QID) | 11 refills | Status: DC

## 2020-03-15 MED FILL — NALTREXONE 50 MG TABLET: 50 | 30 days supply | Qty: 30 | Fill #0

## 2020-03-15 NOTE — Assessment & Plan Note (Signed)
Well-controlled with atorvastatin 40 mg daily.  Continue current medications.

## 2020-03-15 NOTE — Assessment & Plan Note (Signed)
Smoking 0.5 PPD x 25 years, used to smoke 1-2 PPD x 10-12 years. Pack Years ~25. Not interested in quitting. Patient does qualify for lung cancer screen at this time. Patient was counseled on the risks of tobacco use and cessation strongly encouraged. - will discuss obtaining low dose CT at follow up visit in 3 months

## 2020-03-15 NOTE — Assessment & Plan Note (Signed)
History of alcoholism. Currently drinking 2-3 shots of liquor occasionally. Voiced concern and cautioned him with his history and current use. Patient is interested in starting something to help with the cravings. - Start Naltrexone 50mg  QD. If not on formulary then can consider Acamprosate 666mg  TID. - Liver and kidney function WNL.Negative Hepatitis B, C, and HIV. Not currently on opioids. No history of liver disease. - will plan for repeat LFP in 3 months to monitor

## 2020-03-15 NOTE — Assessment & Plan Note (Addendum)
Chronic, uncontrolled but improving.  We will continue current medications at this time given improvement.  Considered increasing long-acting insulin due to elevated morning blood sugars, however endorsement of hypoglycemic episodes are concerning.  - Continue Jardiance 25mg  QD, Metformin 1000mg  BID,  Januvia 100mg  QD, Basaglar 20U qAM, Humalog sliding scale (2-4 units) TID -  Plan to follow-up in 3 months for repeat A1c.   - Diabetic foot exam completed today with decreased sensation in the distal feet bilaterally.  Discussed importance of daily foot care to prevent future infections.   - Patient plans to schedule diabetic eye exam at earliest convenience - plan to help patient obtain CGM device for better and easier management of diabetes 4 insulin injections a day and need for more frequent CBG monitoring - Referral to Diabetes and Nutrition clinic for CGM training

## 2020-03-18 DIAGNOSIS — J449 Chronic obstructive pulmonary disease, unspecified: Secondary | ICD-10-CM | POA: Diagnosis not present

## 2020-03-18 DIAGNOSIS — I10 Essential (primary) hypertension: Secondary | ICD-10-CM | POA: Diagnosis not present

## 2020-03-18 DIAGNOSIS — T8789 Other complications of amputation stump: Secondary | ICD-10-CM | POA: Diagnosis not present

## 2020-03-18 DIAGNOSIS — T8743 Infection of amputation stump, right lower extremity: Secondary | ICD-10-CM | POA: Diagnosis not present

## 2020-03-18 DIAGNOSIS — E1151 Type 2 diabetes mellitus with diabetic peripheral angiopathy without gangrene: Secondary | ICD-10-CM | POA: Diagnosis not present

## 2020-03-19 ENCOUNTER — Telehealth: Payer: Self-pay

## 2020-03-19 NOTE — Telephone Encounter (Signed)
Received fax from pharmacy, PA needed on Dexcom 6 Sensor. Clinical questions submitted via Cover My Meds. Waiting on response, could take up to 72 hours.  Cover My Meds info: Key: [id number]

## 2020-03-22 DIAGNOSIS — T8743 Infection of amputation stump, right lower extremity: Secondary | ICD-10-CM | POA: Diagnosis not present

## 2020-03-22 DIAGNOSIS — J449 Chronic obstructive pulmonary disease, unspecified: Secondary | ICD-10-CM | POA: Diagnosis not present

## 2020-03-22 DIAGNOSIS — E1151 Type 2 diabetes mellitus with diabetic peripheral angiopathy without gangrene: Secondary | ICD-10-CM | POA: Diagnosis not present

## 2020-03-22 DIAGNOSIS — T8789 Other complications of amputation stump: Secondary | ICD-10-CM | POA: Diagnosis not present

## 2020-03-22 DIAGNOSIS — I10 Essential (primary) hypertension: Secondary | ICD-10-CM | POA: Diagnosis not present

## 2020-03-22 MED FILL — FREESTYLE LITE TEST STRIP: 30 days supply | Qty: 100 | Fill #8

## 2020-03-22 MED FILL — ALBUTEROL SULFATE HFA 108 (: 108 (90 BAS | 8 days supply | Qty: 18 | Fill #2

## 2020-03-22 MED FILL — DEXCOM G6 TRANSMITTER MISC: 90 days supply | Qty: 1 | Fill #0

## 2020-03-22 MED FILL — MONTELUKAST SOD 10 MG TAB: 10 | 90 days supply | Qty: 90 | Fill #2

## 2020-03-22 MED FILL — DEXCOM G6 RECEIVER DEVI: 30 days supply | Qty: 1 | Fill #0

## 2020-03-22 MED FILL — DEXCOM G6 SENSOR MISC: 30 days supply | Qty: 3 | Fill #0

## 2020-03-22 NOTE — Telephone Encounter (Signed)
Medication approved via covermymeds. The pharmacy has been updated.

## 2020-03-25 DIAGNOSIS — I10 Essential (primary) hypertension: Secondary | ICD-10-CM | POA: Diagnosis not present

## 2020-03-25 DIAGNOSIS — T8789 Other complications of amputation stump: Secondary | ICD-10-CM | POA: Diagnosis not present

## 2020-03-25 DIAGNOSIS — E1151 Type 2 diabetes mellitus with diabetic peripheral angiopathy without gangrene: Secondary | ICD-10-CM | POA: Diagnosis not present

## 2020-03-25 DIAGNOSIS — J449 Chronic obstructive pulmonary disease, unspecified: Secondary | ICD-10-CM | POA: Diagnosis not present

## 2020-03-25 DIAGNOSIS — T8743 Infection of amputation stump, right lower extremity: Secondary | ICD-10-CM | POA: Diagnosis not present

## 2020-03-27 DIAGNOSIS — T8743 Infection of amputation stump, right lower extremity: Secondary | ICD-10-CM | POA: Diagnosis not present

## 2020-03-27 DIAGNOSIS — E1151 Type 2 diabetes mellitus with diabetic peripheral angiopathy without gangrene: Secondary | ICD-10-CM | POA: Diagnosis not present

## 2020-03-27 DIAGNOSIS — I10 Essential (primary) hypertension: Secondary | ICD-10-CM | POA: Diagnosis not present

## 2020-03-27 DIAGNOSIS — J449 Chronic obstructive pulmonary disease, unspecified: Secondary | ICD-10-CM | POA: Diagnosis not present

## 2020-03-27 DIAGNOSIS — T8789 Other complications of amputation stump: Secondary | ICD-10-CM | POA: Diagnosis not present

## 2020-03-29 DIAGNOSIS — I10 Essential (primary) hypertension: Secondary | ICD-10-CM | POA: Diagnosis not present

## 2020-03-29 DIAGNOSIS — T8789 Other complications of amputation stump: Secondary | ICD-10-CM | POA: Diagnosis not present

## 2020-03-29 DIAGNOSIS — T8743 Infection of amputation stump, right lower extremity: Secondary | ICD-10-CM | POA: Diagnosis not present

## 2020-03-29 DIAGNOSIS — E1151 Type 2 diabetes mellitus with diabetic peripheral angiopathy without gangrene: Secondary | ICD-10-CM | POA: Diagnosis not present

## 2020-03-29 DIAGNOSIS — J449 Chronic obstructive pulmonary disease, unspecified: Secondary | ICD-10-CM | POA: Diagnosis not present

## 2020-04-01 DIAGNOSIS — T8789 Other complications of amputation stump: Secondary | ICD-10-CM | POA: Diagnosis not present

## 2020-04-01 DIAGNOSIS — I10 Essential (primary) hypertension: Secondary | ICD-10-CM | POA: Diagnosis not present

## 2020-04-01 DIAGNOSIS — E1151 Type 2 diabetes mellitus with diabetic peripheral angiopathy without gangrene: Secondary | ICD-10-CM | POA: Diagnosis not present

## 2020-04-01 DIAGNOSIS — J449 Chronic obstructive pulmonary disease, unspecified: Secondary | ICD-10-CM | POA: Diagnosis not present

## 2020-04-01 DIAGNOSIS — T8743 Infection of amputation stump, right lower extremity: Secondary | ICD-10-CM | POA: Diagnosis not present

## 2020-04-03 DIAGNOSIS — J449 Chronic obstructive pulmonary disease, unspecified: Secondary | ICD-10-CM | POA: Diagnosis not present

## 2020-04-03 DIAGNOSIS — E1151 Type 2 diabetes mellitus with diabetic peripheral angiopathy without gangrene: Secondary | ICD-10-CM | POA: Diagnosis not present

## 2020-04-03 DIAGNOSIS — T8789 Other complications of amputation stump: Secondary | ICD-10-CM | POA: Diagnosis not present

## 2020-04-03 DIAGNOSIS — I10 Essential (primary) hypertension: Secondary | ICD-10-CM | POA: Diagnosis not present

## 2020-04-03 DIAGNOSIS — T8743 Infection of amputation stump, right lower extremity: Secondary | ICD-10-CM | POA: Diagnosis not present

## 2020-04-04 ENCOUNTER — Ambulatory Visit: Payer: 59 | Attending: Internal Medicine

## 2020-04-04 DIAGNOSIS — Z23 Encounter for immunization: Secondary | ICD-10-CM

## 2020-04-04 NOTE — Progress Notes (Signed)
   Covid-19 Vaccination Clinic  Name:  Christopher Fernandez    MRN: [id number] DOB: 10-28-1970  04/04/2020  [name] was observed post Covid-19 immunization for 15 minutes without incident. He was provided with Vaccine Information Sheet and instruction to access the V-Safe system.   [name] was instructed to call 911 with any severe reactions post vaccine: [name] Difficulty breathing  . Swelling of face and throat  . A fast heartbeat  . A bad rash all over body  . Dizziness and weakness   Immunizations Administered    Name Date Dose VIS Date Route   Pfizer COVID-19 Vaccine 04/04/2020  8:31 AM 0.3 mL 11/08/2018 Intramuscular   Manufacturer: [employer]   Lot: [id number]   [place]: [id number]

## 2020-04-05 DIAGNOSIS — E1151 Type 2 diabetes mellitus with diabetic peripheral angiopathy without gangrene: Secondary | ICD-10-CM | POA: Diagnosis not present

## 2020-04-05 DIAGNOSIS — I10 Essential (primary) hypertension: Secondary | ICD-10-CM | POA: Diagnosis not present

## 2020-04-05 DIAGNOSIS — J449 Chronic obstructive pulmonary disease, unspecified: Secondary | ICD-10-CM | POA: Diagnosis not present

## 2020-04-05 DIAGNOSIS — T8743 Infection of amputation stump, right lower extremity: Secondary | ICD-10-CM | POA: Diagnosis not present

## 2020-04-05 DIAGNOSIS — T8789 Other complications of amputation stump: Secondary | ICD-10-CM | POA: Diagnosis not present

## 2020-04-05 MED FILL — ARIPiprazole 5 MG TABS: 5 | 90 days supply | Qty: 90 | Fill #2

## 2020-04-05 MED FILL — ATORVASTATIN 40 MG TABLET: 40 | 90 days supply | Qty: 90 | Fill #1

## 2020-04-05 MED FILL — DULoxetine HCL 60 MG CPEP: 60 | 90 days supply | Qty: 90 | Fill #2

## 2020-04-05 MED FILL — BASAGLAR 100 UNIT/ML KWIKPE: 100 | 30 days supply | Qty: 6 | Fill #3

## 2020-04-08 ENCOUNTER — Other Ambulatory Visit: Payer: Self-pay | Admitting: Family Medicine

## 2020-04-08 DIAGNOSIS — E1151 Type 2 diabetes mellitus with diabetic peripheral angiopathy without gangrene: Secondary | ICD-10-CM | POA: Diagnosis not present

## 2020-04-08 DIAGNOSIS — I10 Essential (primary) hypertension: Secondary | ICD-10-CM | POA: Diagnosis not present

## 2020-04-08 DIAGNOSIS — T8789 Other complications of amputation stump: Secondary | ICD-10-CM | POA: Diagnosis not present

## 2020-04-08 DIAGNOSIS — Z794 Long term (current) use of insulin: Secondary | ICD-10-CM

## 2020-04-08 DIAGNOSIS — E1165 Type 2 diabetes mellitus with hyperglycemia: Secondary | ICD-10-CM

## 2020-04-08 DIAGNOSIS — J449 Chronic obstructive pulmonary disease, unspecified: Secondary | ICD-10-CM | POA: Diagnosis not present

## 2020-04-08 DIAGNOSIS — T8743 Infection of amputation stump, right lower extremity: Secondary | ICD-10-CM | POA: Diagnosis not present

## 2020-04-08 MED FILL — GABAPENTIN 300 MG CAPSULE: 300 | 30 days supply | Qty: 90 | Fill #0

## 2020-04-08 MED FILL — PENTOXIFYLLINE 400 MG TAB S: 400 | 30 days supply | Qty: 90 | Fill #3

## 2020-04-09 ENCOUNTER — Encounter: Payer: Self-pay | Admitting: Vascular Surgery

## 2020-04-09 ENCOUNTER — Ambulatory Visit (INDEPENDENT_AMBULATORY_CARE_PROVIDER_SITE_OTHER): Payer: Self-pay | Admitting: Vascular Surgery

## 2020-04-09 ENCOUNTER — Other Ambulatory Visit: Payer: Self-pay

## 2020-04-09 VITALS — BP 118/82 | HR 74 | Temp 97.7°F | Resp 18 | Ht 75.75 in | Wt 261.9 lb

## 2020-04-09 DIAGNOSIS — T8189XA Other complications of procedures, not elsewhere classified, initial encounter: Secondary | ICD-10-CM | POA: Diagnosis not present

## 2020-04-09 DIAGNOSIS — Z89431 Acquired absence of right foot: Secondary | ICD-10-CM

## 2020-04-09 NOTE — Progress Notes (Signed)
Patient name: Christopher Fernandez MRN: [id number] DOB: 1971-01-25 Sex: male  REASON FOR VISIT: Wound check right TMA  HPI: Christopher Fernandez is a 49 y.o. male with diabetes, HTN that presents for ongoing wound check of his right foot.  He had a nonhealing right transmetatarsal amputation performed by orthopedics.  I subsequently took him to the operating room and did sharp excisional debridement of the TMA amputation with VAC placement and also got him on IV antibiotics for osteomyelitis which is now completed.  He remains in a VAC and overall feels like the wound is making progress.  No fevers chills foul smell etc.  Past Medical History:  Diagnosis Date  . Alcoholism ([place])   . Anemia   . Anxiety   . Arthritis   . Asthma   . COPD (chronic obstructive pulmonary disease) ([place])   . Depression   . Diabetes ([place])    type 2  . GERD (gastroesophageal reflux disease)   . History of hiatal hernia   . HTN (hypertension)   . Osteomyelitis ([place])    right forefoot  . Pancreatitis   . Pneumonia   . Wears glasses     Past Surgical History:  Procedure Laterality Date  . 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 AMPUTATION Right 10/18/2019   Procedure: RIGHT SECOND TOE AMPUTATION;  Surgeon: Newt Minion, MD;  Location: [facility];  Service: Orthopedics;  Laterality: Right;  . AMPUTATION Right 11/08/2019   Procedure: RIGHT TRANSMETATARSAL AMPUTATION;  Surgeon: Newt Minion, MD;  Location: [facility];  Service: Orthopedics;  Laterality: Right;  . APPENDECTOMY    . APPLICATION OF WOUND VAC Right 01/10/2020   Procedure: APPLICATION OF WOUND VAC, right foot;  Surgeon: [name], MD;  Location: [facility];  Service: Vascular;  Laterality: Right;  . CHOLECYSTECTOMY  2006   with gallbladder and spleen  . COLONOSCOPY  8-10 years ago    in [place] exam per pt  . HERNIA REPAIR  2008, 2010   hiatal hernia and 1 additional  . NASAL SEPTUM SURGERY    . PANCREATIC PSEUDOCYST DRAINAGE    .  SPLENECTOMY  2006  . STUMP REVISION Right 11/24/2019   Procedure: REVISION RIGHT TRANSMETATARSAL AMPUTATION;  Surgeon: Newt Minion, MD;  Location: [facility];  Service: Orthopedics;  Laterality: Right;  . TOTAL KNEE ARTHROPLASTY Right 1982, 1984  . WOUND DEBRIDEMENT Right 01/10/2020   Procedure: incisional DEBRIDEMENT of RIGHT TRANSMETATARSAL WOUND;  Surgeon: [name], MD;  Location: [facility] OR;  Service: Vascular;  Laterality: Right;    Family History  Problem Relation Age of Onset  . Diabetes Mother   . Hypertension Mother   . Hyperlipidemia Mother   . Kidney disease Mother   . Thyroid disease Mother   . Breast cancer Mother        mets  . Lung cancer Mother   . Diabetes Father   . Alcohol abuse Sister   . Sickle cell trait Sister   . Diabetes Brother   . Asthma Brother   . Lung cancer Maternal Grandmother   . Kidney disease Maternal Grandmother   . Liver cancer Maternal Uncle        x 4-5  . Kidney disease Maternal Uncle   . Kidney disease Paternal Uncle   . Colon cancer Neg Hx   . Colon polyps Neg Hx   . Esophageal cancer Neg Hx   . Rectal cancer Neg Hx   .  Stomach cancer Neg Hx     SOCIAL HISTORY: Social History   Tobacco Use  . Smoking status: Current Every Day Smoker    Packs/day: 0.50    Types: Cigarettes  . Smokeless tobacco: Never Used  Substance Use Topics  . Alcohol use: Not Currently    Comment: 2-3 per day, none since 2019    Allergies  Allergen Reactions  . Eggs Or Egg-Derived Products Rash  . Morphine And Related Other (See Comments)    Cant take because of pancreatitis  . Cocoa Rash    Current Outpatient Medications  Medication Sig Dispense Refill  . albuterol (VENTOLIN HFA) 108 (90 Base) MCG/ACT inhaler Inhale 2-4 puffs into the lungs every 4 (four) hours as needed for wheezing (or cough). (Patient taking differently: Inhale 2 puffs into the lungs every 4 (four) hours as needed for wheezing (or cough). ) 6.7 g 11  . ARIPiprazole  (ABILIFY) 5 MG tablet Take 1 tablet (5 mg total) by mouth daily. 90 tablet 2  . atorvastatin (LIPITOR) 40 MG tablet Take 1 tablet (40 mg total) by mouth daily. 90 tablet 3  . bismuth subsalicylate (PEPTO BISMOL) 262 MG/15ML suspension Take 30 mLs by mouth every 6 (six) hours as needed for indigestion or diarrhea or loose stools.    . Continuous Blood Gluc Receiver (DEXCOM G6 RECEIVER) DEVI 1 Device by Does not apply route 4 (four) times daily. 1 each 11  . Continuous Blood Gluc Sensor (DEXCOM G6 SENSOR) MISC 1 Device by Does not apply route 4 (four) times daily. 3 each 11  . Continuous Blood Gluc Transmit (DEXCOM G6 TRANSMITTER) MISC 1 Device by Does not apply route 4 (four) times daily. 1 each 11  . diphenhydrAMINE (BENADRYL) 25 MG tablet Take 25 mg by mouth every 6 (six) hours as needed for itching or allergies.     . DULoxetine (CYMBALTA) 60 MG capsule Take 1 capsule (60 mg total) by mouth daily. 90 capsule 2  . EASY TOUCH PEN NEEDLES 31G X 5 MM MISC Inject 1 application as directed 3 (three) times daily. 100 each 11  . empagliflozin (JARDIANCE) 25 MG TABS tablet Take 25 mg by mouth daily. 90 tablet 3  . fluticasone (FLONASE) 50 MCG/ACT nasal spray Place 1 spray into both nostrils daily as needed for allergies or rhinitis.    . Fluticasone-Salmeterol (ADVAIR DISKUS) 250-50 MCG/DOSE AEPB Inhale 1 puff into the lungs 2 (two) times daily. 180 each 3  . gabapentin (NEURONTIN) 300 MG capsule TAKE 1 CAPSULE (300 MG TOTAL) BY MOUTH 3 (THREE) TIMES DAILY. 90 capsule 2  . glucose blood (FREESTYLE LITE) test strip Use as instructed 100 each 12  . ibuprofen (ADVIL) 200 MG tablet Take 800 mg by mouth every 8 (eight) hours as needed for headache or moderate pain.     . Insulin Glargine (BASAGLAR KWIKPEN) 100 UNIT/ML Inject 0.2 mLs (20 Units total) into the skin in the morning. 9 mL 2  . insulin lispro (HUMALOG) 100 UNIT/ML injection Inject 0-0.1 mLs (0-10 Units total) into the skin 3 (three) times daily with  meals. 10 mL 2  . Lancets (FREESTYLE) lancets Use as instructed 100 each 12  . Melatonin 10 MG CAPS Take 30 mg by mouth at bedtime as needed (sleep).     . metFORMIN (GLUCOPHAGE) 1000 MG tablet Take 1 tablet (1,000 mg total) by mouth 2 (two) times daily with a meal. 180 tablet 3  . metroNIDAZOLE (FLAGYL) 500 MG tablet Take 1 tablet (500  mg total) by mouth every 8 (eight) hours. 126 tablet 0  . mirtazapine (REMERON) 30 MG tablet Take 1 tablet (30 mg total) by mouth at bedtime. 90 tablet 3  . montelukast (SINGULAIR) 10 MG tablet Take 1 tablet (10 mg total) by mouth at bedtime. (Patient taking differently: Take 10 mg by mouth daily. ) 90 tablet 3  . naltrexone (DEPADE) 50 MG tablet Take 1 tablet (50 mg total) by mouth daily. 30 tablet 0  . nitroGLYCERIN (NITRODUR - DOSED IN MG/24 HR) 0.2 mg/hr patch Place 1 patch (0.2 mg total) onto the skin daily. (Patient taking differently: Place 0.1 mg onto the skin daily. ) 30 patch 12  . pentoxifylline (TRENTAL) 400 MG CR tablet Take 1 tablet (400 mg total) by mouth 3 (three) times daily with meals. 90 tablet 3  . silver sulfADIAZINE (SILVADENE) 1 % cream Apply 1 application topically daily. Apply to affected area daily plus dry dressing 400 g 3  . sitaGLIPtin (JANUVIA) 100 MG tablet Take 1 tablet (100 mg total) by mouth daily. 30 tablet 10  . Valerian Root 450 MG CAPS Take 900 tablets by mouth at bedtime.      No current facility-administered medications for this visit.    REVIEW OF SYSTEMS:  [X]  denotes positive finding, [ ]  denotes negative finding Cardiac  Comments:  Chest pain or chest pressure:    Shortness of breath upon exertion:    Short of breath when lying flat:    Irregular heart rhythm:        Vascular    Pain in calf, thigh, or hip brought on by ambulation:    Pain in feet at night that wakes you up from your sleep:     Blood clot in your veins:    Leg swelling:         Pulmonary    Oxygen at home:    Productive cough:     Wheezing:          Neurologic    Sudden weakness in arms or legs:     Sudden numbness in arms or legs:     Sudden onset of difficulty speaking or slurred speech:    Temporary loss of vision in one eye:     Problems with dizziness:         Gastrointestinal    Blood in stool:     Vomited blood:         Genitourinary    Burning when urinating:     Blood in urine:        Psychiatric    Major depression:         Hematologic    Bleeding problems:    Problems with blood clotting too easily:        Skin    Rashes or ulcers:        Constitutional    Fever or chills:      PHYSICAL EXAM: Vitals:   04/09/20 1038  BP: 118/82  Pulse: 74  Resp: 18  Temp: 97.7 F (36.5 C)  TempSrc: Temporal  SpO2: 98%  Weight: (!) 261 lb 14.4 oz (118.8 kg)  Height: 6' 3.75" (1.924 m)    GENERAL: The patient is a well-nourished male, in no acute distress. The vital signs are documented above. CARDIAC: There is a regular rate and rhythm.  VASCULAR:  Right DP palpable Healthy wound base in TMA as pictured below      DATA:   None  Assessment/Plan:  49 year old male status post right TMA debridement for nonhealing TMA that was performed orthopedic surgery.  He has now completed his IV antibiotics for osteomyelitis.  Overall the wound looks good and is granulating.  I recommend that he stay in a vac for now.  We will have him follow-up in 4 to 6 weeks for wound check.   [name], MD [facility] Office: [phone]

## 2020-04-10 DIAGNOSIS — I10 Essential (primary) hypertension: Secondary | ICD-10-CM | POA: Diagnosis not present

## 2020-04-10 DIAGNOSIS — T8743 Infection of amputation stump, right lower extremity: Secondary | ICD-10-CM | POA: Diagnosis not present

## 2020-04-10 DIAGNOSIS — E1151 Type 2 diabetes mellitus with diabetic peripheral angiopathy without gangrene: Secondary | ICD-10-CM | POA: Diagnosis not present

## 2020-04-10 DIAGNOSIS — J449 Chronic obstructive pulmonary disease, unspecified: Secondary | ICD-10-CM | POA: Diagnosis not present

## 2020-04-10 DIAGNOSIS — T8789 Other complications of amputation stump: Secondary | ICD-10-CM | POA: Diagnosis not present

## 2020-04-11 DIAGNOSIS — T8189XA Other complications of procedures, not elsewhere classified, initial encounter: Secondary | ICD-10-CM | POA: Diagnosis not present

## 2020-04-15 DIAGNOSIS — T8743 Infection of amputation stump, right lower extremity: Secondary | ICD-10-CM | POA: Diagnosis not present

## 2020-04-15 DIAGNOSIS — J449 Chronic obstructive pulmonary disease, unspecified: Secondary | ICD-10-CM | POA: Diagnosis not present

## 2020-04-15 DIAGNOSIS — I10 Essential (primary) hypertension: Secondary | ICD-10-CM | POA: Diagnosis not present

## 2020-04-15 DIAGNOSIS — E1151 Type 2 diabetes mellitus with diabetic peripheral angiopathy without gangrene: Secondary | ICD-10-CM | POA: Diagnosis not present

## 2020-04-15 DIAGNOSIS — T8789 Other complications of amputation stump: Secondary | ICD-10-CM | POA: Diagnosis not present

## 2020-04-17 DIAGNOSIS — I10 Essential (primary) hypertension: Secondary | ICD-10-CM | POA: Diagnosis not present

## 2020-04-17 DIAGNOSIS — T8789 Other complications of amputation stump: Secondary | ICD-10-CM | POA: Diagnosis not present

## 2020-04-17 DIAGNOSIS — J449 Chronic obstructive pulmonary disease, unspecified: Secondary | ICD-10-CM | POA: Diagnosis not present

## 2020-04-17 DIAGNOSIS — T8743 Infection of amputation stump, right lower extremity: Secondary | ICD-10-CM | POA: Diagnosis not present

## 2020-04-17 DIAGNOSIS — E1151 Type 2 diabetes mellitus with diabetic peripheral angiopathy without gangrene: Secondary | ICD-10-CM | POA: Diagnosis not present

## 2020-04-19 DIAGNOSIS — T8789 Other complications of amputation stump: Secondary | ICD-10-CM | POA: Diagnosis not present

## 2020-04-19 DIAGNOSIS — E1151 Type 2 diabetes mellitus with diabetic peripheral angiopathy without gangrene: Secondary | ICD-10-CM | POA: Diagnosis not present

## 2020-04-19 DIAGNOSIS — J449 Chronic obstructive pulmonary disease, unspecified: Secondary | ICD-10-CM | POA: Diagnosis not present

## 2020-04-19 DIAGNOSIS — I10 Essential (primary) hypertension: Secondary | ICD-10-CM | POA: Diagnosis not present

## 2020-04-19 DIAGNOSIS — T8743 Infection of amputation stump, right lower extremity: Secondary | ICD-10-CM | POA: Diagnosis not present

## 2020-04-22 DIAGNOSIS — E1151 Type 2 diabetes mellitus with diabetic peripheral angiopathy without gangrene: Secondary | ICD-10-CM | POA: Diagnosis not present

## 2020-04-22 DIAGNOSIS — T8789 Other complications of amputation stump: Secondary | ICD-10-CM | POA: Diagnosis not present

## 2020-04-22 DIAGNOSIS — T8743 Infection of amputation stump, right lower extremity: Secondary | ICD-10-CM | POA: Diagnosis not present

## 2020-04-22 DIAGNOSIS — I10 Essential (primary) hypertension: Secondary | ICD-10-CM | POA: Diagnosis not present

## 2020-04-22 DIAGNOSIS — J449 Chronic obstructive pulmonary disease, unspecified: Secondary | ICD-10-CM | POA: Diagnosis not present

## 2020-04-24 DIAGNOSIS — E1151 Type 2 diabetes mellitus with diabetic peripheral angiopathy without gangrene: Secondary | ICD-10-CM | POA: Diagnosis not present

## 2020-04-24 DIAGNOSIS — I10 Essential (primary) hypertension: Secondary | ICD-10-CM | POA: Diagnosis not present

## 2020-04-24 DIAGNOSIS — T8789 Other complications of amputation stump: Secondary | ICD-10-CM | POA: Diagnosis not present

## 2020-04-24 DIAGNOSIS — T8743 Infection of amputation stump, right lower extremity: Secondary | ICD-10-CM | POA: Diagnosis not present

## 2020-04-24 DIAGNOSIS — J449 Chronic obstructive pulmonary disease, unspecified: Secondary | ICD-10-CM | POA: Diagnosis not present

## 2020-04-29 DIAGNOSIS — J449 Chronic obstructive pulmonary disease, unspecified: Secondary | ICD-10-CM | POA: Diagnosis not present

## 2020-04-29 DIAGNOSIS — I10 Essential (primary) hypertension: Secondary | ICD-10-CM | POA: Diagnosis not present

## 2020-04-29 DIAGNOSIS — E1151 Type 2 diabetes mellitus with diabetic peripheral angiopathy without gangrene: Secondary | ICD-10-CM | POA: Diagnosis not present

## 2020-04-29 DIAGNOSIS — T8789 Other complications of amputation stump: Secondary | ICD-10-CM | POA: Diagnosis not present

## 2020-04-29 DIAGNOSIS — T8743 Infection of amputation stump, right lower extremity: Secondary | ICD-10-CM | POA: Diagnosis not present

## 2020-05-01 ENCOUNTER — Other Ambulatory Visit: Payer: Self-pay | Admitting: Family Medicine

## 2020-05-01 DIAGNOSIS — E1165 Type 2 diabetes mellitus with hyperglycemia: Secondary | ICD-10-CM

## 2020-05-01 DIAGNOSIS — T8743 Infection of amputation stump, right lower extremity: Secondary | ICD-10-CM | POA: Diagnosis not present

## 2020-05-01 DIAGNOSIS — T8789 Other complications of amputation stump: Secondary | ICD-10-CM | POA: Diagnosis not present

## 2020-05-01 DIAGNOSIS — E1151 Type 2 diabetes mellitus with diabetic peripheral angiopathy without gangrene: Secondary | ICD-10-CM | POA: Diagnosis not present

## 2020-05-01 DIAGNOSIS — J449 Chronic obstructive pulmonary disease, unspecified: Secondary | ICD-10-CM | POA: Diagnosis not present

## 2020-05-01 DIAGNOSIS — I10 Essential (primary) hypertension: Secondary | ICD-10-CM | POA: Diagnosis not present

## 2020-05-01 DIAGNOSIS — Z794 Long term (current) use of insulin: Secondary | ICD-10-CM

## 2020-05-01 MED FILL — JARDIANCE 25 MG TABLET: 25 | 90 days supply | Qty: 90 | Fill #2

## 2020-05-01 MED FILL — BASAGLAR 100 UNIT/ML KWIKPE: 100 | 15 days supply | Qty: 3 | Fill #4

## 2020-05-03 DIAGNOSIS — J449 Chronic obstructive pulmonary disease, unspecified: Secondary | ICD-10-CM | POA: Diagnosis not present

## 2020-05-03 DIAGNOSIS — T8789 Other complications of amputation stump: Secondary | ICD-10-CM | POA: Diagnosis not present

## 2020-05-03 DIAGNOSIS — I10 Essential (primary) hypertension: Secondary | ICD-10-CM | POA: Diagnosis not present

## 2020-05-03 DIAGNOSIS — E1151 Type 2 diabetes mellitus with diabetic peripheral angiopathy without gangrene: Secondary | ICD-10-CM | POA: Diagnosis not present

## 2020-05-03 DIAGNOSIS — T8743 Infection of amputation stump, right lower extremity: Secondary | ICD-10-CM | POA: Diagnosis not present

## 2020-05-06 DIAGNOSIS — T8789 Other complications of amputation stump: Secondary | ICD-10-CM | POA: Diagnosis not present

## 2020-05-06 DIAGNOSIS — E1151 Type 2 diabetes mellitus with diabetic peripheral angiopathy without gangrene: Secondary | ICD-10-CM | POA: Diagnosis not present

## 2020-05-06 DIAGNOSIS — T8743 Infection of amputation stump, right lower extremity: Secondary | ICD-10-CM | POA: Diagnosis not present

## 2020-05-06 DIAGNOSIS — J449 Chronic obstructive pulmonary disease, unspecified: Secondary | ICD-10-CM | POA: Diagnosis not present

## 2020-05-06 DIAGNOSIS — I10 Essential (primary) hypertension: Secondary | ICD-10-CM | POA: Diagnosis not present

## 2020-05-08 DIAGNOSIS — T8743 Infection of amputation stump, right lower extremity: Secondary | ICD-10-CM | POA: Diagnosis not present

## 2020-05-08 DIAGNOSIS — J449 Chronic obstructive pulmonary disease, unspecified: Secondary | ICD-10-CM | POA: Diagnosis not present

## 2020-05-08 DIAGNOSIS — I10 Essential (primary) hypertension: Secondary | ICD-10-CM | POA: Diagnosis not present

## 2020-05-08 DIAGNOSIS — T8789 Other complications of amputation stump: Secondary | ICD-10-CM | POA: Diagnosis not present

## 2020-05-08 DIAGNOSIS — E1151 Type 2 diabetes mellitus with diabetic peripheral angiopathy without gangrene: Secondary | ICD-10-CM | POA: Diagnosis not present

## 2020-05-10 DIAGNOSIS — I10 Essential (primary) hypertension: Secondary | ICD-10-CM | POA: Diagnosis not present

## 2020-05-10 DIAGNOSIS — T8743 Infection of amputation stump, right lower extremity: Secondary | ICD-10-CM | POA: Diagnosis not present

## 2020-05-10 DIAGNOSIS — J449 Chronic obstructive pulmonary disease, unspecified: Secondary | ICD-10-CM | POA: Diagnosis not present

## 2020-05-10 DIAGNOSIS — E1151 Type 2 diabetes mellitus with diabetic peripheral angiopathy without gangrene: Secondary | ICD-10-CM | POA: Diagnosis not present

## 2020-05-10 DIAGNOSIS — T8789 Other complications of amputation stump: Secondary | ICD-10-CM | POA: Diagnosis not present

## 2020-05-11 DIAGNOSIS — T8189XA Other complications of procedures, not elsewhere classified, initial encounter: Secondary | ICD-10-CM | POA: Diagnosis not present

## 2020-05-13 ENCOUNTER — Ambulatory Visit: Payer: 59 | Admitting: Registered"

## 2020-05-13 DIAGNOSIS — E1151 Type 2 diabetes mellitus with diabetic peripheral angiopathy without gangrene: Secondary | ICD-10-CM | POA: Diagnosis not present

## 2020-05-13 DIAGNOSIS — I10 Essential (primary) hypertension: Secondary | ICD-10-CM | POA: Diagnosis not present

## 2020-05-13 DIAGNOSIS — T8743 Infection of amputation stump, right lower extremity: Secondary | ICD-10-CM | POA: Diagnosis not present

## 2020-05-13 DIAGNOSIS — J449 Chronic obstructive pulmonary disease, unspecified: Secondary | ICD-10-CM | POA: Diagnosis not present

## 2020-05-13 DIAGNOSIS — T8789 Other complications of amputation stump: Secondary | ICD-10-CM | POA: Diagnosis not present

## 2020-05-13 DIAGNOSIS — T8189XA Other complications of procedures, not elsewhere classified, initial encounter: Secondary | ICD-10-CM | POA: Diagnosis not present

## 2020-05-14 ENCOUNTER — Ambulatory Visit (INDEPENDENT_AMBULATORY_CARE_PROVIDER_SITE_OTHER): Payer: 59 | Admitting: Vascular Surgery

## 2020-05-14 ENCOUNTER — Other Ambulatory Visit: Payer: Self-pay

## 2020-05-14 ENCOUNTER — Encounter: Payer: Self-pay | Admitting: Vascular Surgery

## 2020-05-14 ENCOUNTER — Other Ambulatory Visit: Payer: Self-pay | Admitting: Family Medicine

## 2020-05-14 VITALS — BP 99/69 | HR 92 | Temp 98.1°F | Resp 18 | Ht 75.0 in | Wt 265.0 lb

## 2020-05-14 DIAGNOSIS — Z89431 Acquired absence of right foot: Secondary | ICD-10-CM | POA: Diagnosis not present

## 2020-05-14 DIAGNOSIS — E1165 Type 2 diabetes mellitus with hyperglycemia: Secondary | ICD-10-CM

## 2020-05-14 DIAGNOSIS — Z794 Long term (current) use of insulin: Secondary | ICD-10-CM

## 2020-05-14 MED FILL — GABAPENTIN 300 MG CAPSULE: 300 | 30 days supply | Qty: 90 | Fill #1

## 2020-05-14 NOTE — Progress Notes (Signed)
Patient name: Christopher Fernandez MRN: [id number] DOB: May 13, 1971 Sex: male  REASON FOR VISIT: 1 month follow-up for wound check right TMA  HPI: Christopher Fernandez is a 49 y.o. male with diabetes, HTN that presents for ongoing wound check and 1 month follow-up of his right foot.  He had a nonhealing right transmetatarsal amputation performed by orthopedics.  I subsequently took him to the operating room and did sharp excisional debridement of the TMA amputation with VAC placement and also got him on IV antibiotics for osteomyelitis which is now completed.  He remains in a VAC and overall feels like the wound is making progress.  No fevers chills foul smell etc. No other concerns today.  Past Medical History:  Diagnosis Date  . Alcoholism ([place])   . Anemia   . Anxiety   . Arthritis   . Asthma   . COPD (chronic obstructive pulmonary disease) ([place])   . Depression   . Diabetes ([place])    type 2  . GERD (gastroesophageal reflux disease)   . History of hiatal hernia   . HTN (hypertension)   . Osteomyelitis ([place])    right forefoot  . Pancreatitis   . Pneumonia   . Wears glasses     Past Surgical History:  Procedure Laterality Date  . 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 AMPUTATION Right 10/18/2019   Procedure: RIGHT SECOND TOE AMPUTATION;  Surgeon: Newt Minion, MD;  Location: [facility];  Service: Orthopedics;  Laterality: Right;  . AMPUTATION Right 11/08/2019   Procedure: RIGHT TRANSMETATARSAL AMPUTATION;  Surgeon: Newt Minion, MD;  Location: [facility];  Service: Orthopedics;  Laterality: Right;  . APPENDECTOMY    . APPLICATION OF WOUND VAC Right 01/10/2020   Procedure: APPLICATION OF WOUND VAC, right foot;  Surgeon: [name], MD;  Location: [facility];  Service: Vascular;  Laterality: Right;  . CHOLECYSTECTOMY  2006   with gallbladder and spleen  . COLONOSCOPY  8-10 years ago    in [place] exam per pt  . HERNIA REPAIR  2008, 2010   hiatal hernia and 1 additional    . NASAL SEPTUM SURGERY    . PANCREATIC PSEUDOCYST DRAINAGE    . SPLENECTOMY  2006  . STUMP REVISION Right 11/24/2019   Procedure: REVISION RIGHT TRANSMETATARSAL AMPUTATION;  Surgeon: Newt Minion, MD;  Location: [place] [facility];  Service: Orthopedics;  Laterality: Right;  . TOTAL KNEE ARTHROPLASTY Right 1982, 1984  . WOUND DEBRIDEMENT Right 01/10/2020   Procedure: incisional DEBRIDEMENT of RIGHT TRANSMETATARSAL WOUND;  Surgeon: [name], MD;  Location: [facility];  Service: Vascular;  Laterality: Right;    Family History  Problem Relation Age of Onset  . Diabetes Mother   . Hypertension Mother   . Hyperlipidemia Mother   . Kidney disease Mother   . Thyroid disease Mother   . Breast cancer Mother        mets  . Lung cancer Mother   . Diabetes Father   . Alcohol abuse Sister   . Sickle cell trait Sister   . Diabetes Brother   . Asthma Brother   . Lung cancer Maternal Grandmother   . Kidney disease Maternal Grandmother   . Liver cancer Maternal Uncle        x 4-5  . Kidney disease Maternal Uncle   . Kidney disease Paternal Uncle   . Colon cancer Neg Hx   . Colon polyps Neg Hx   .  Esophageal cancer Neg Hx   . Rectal cancer Neg Hx   . Stomach cancer Neg Hx     SOCIAL HISTORY: Social History   Tobacco Use  . Smoking status: Current Every Day Smoker    Packs/day: 0.50    Types: Cigarettes  . Smokeless tobacco: Never Used  Substance Use Topics  . Alcohol use: Not Currently    Comment: 2-3 per day, none since 2019    Allergies  Allergen Reactions  . Eggs Or Egg-Derived Products Rash  . Morphine And Related Other (See Comments)    Cant take because of pancreatitis  . Cocoa Rash    Current Outpatient Medications  Medication Sig Dispense Refill  . albuterol (VENTOLIN HFA) 108 (90 Base) MCG/ACT inhaler Inhale 2-4 puffs into the lungs every 4 (four) hours as needed for wheezing (or cough). (Patient taking differently: Inhale 2 puffs into the lungs every 4 (four) hours as  needed for wheezing (or cough). ) 6.7 g 11  . ARIPiprazole (ABILIFY) 5 MG tablet Take 1 tablet (5 mg total) by mouth daily. 90 tablet 2  . atorvastatin (LIPITOR) 40 MG tablet Take 1 tablet (40 mg total) by mouth daily. 90 tablet 3  . bismuth subsalicylate (PEPTO BISMOL) 262 MG/15ML suspension Take 30 mLs by mouth every 6 (six) hours as needed for indigestion or diarrhea or loose stools.    . Continuous Blood Gluc Receiver (DEXCOM G6 RECEIVER) DEVI 1 Device by Does not apply route 4 (four) times daily. 1 each 11  . Continuous Blood Gluc Sensor (DEXCOM G6 SENSOR) MISC 1 Device by Does not apply route 4 (four) times daily. 3 each 11  . Continuous Blood Gluc Transmit (DEXCOM G6 TRANSMITTER) MISC 1 Device by Does not apply route 4 (four) times daily. 1 each 11  . diphenhydrAMINE (BENADRYL) 25 MG tablet Take 25 mg by mouth every 6 (six) hours as needed for itching or allergies.     . DULoxetine (CYMBALTA) 60 MG capsule Take 1 capsule (60 mg total) by mouth daily. 90 capsule 2  . EASY TOUCH PEN NEEDLES 31G X 5 MM MISC Inject 1 application as directed 3 (three) times daily. 100 each 11  . empagliflozin (JARDIANCE) 25 MG TABS tablet Take 25 mg by mouth daily. 90 tablet 3  . fluticasone (FLONASE) 50 MCG/ACT nasal spray Place 1 spray into both nostrils daily as needed for allergies or rhinitis.    . Fluticasone-Salmeterol (ADVAIR DISKUS) 250-50 MCG/DOSE AEPB Inhale 1 puff into the lungs 2 (two) times daily. 180 each 3  . gabapentin (NEURONTIN) 300 MG capsule TAKE 1 CAPSULE (300 MG TOTAL) BY MOUTH 3 (THREE) TIMES DAILY. 90 capsule 2  . glucose blood (FREESTYLE LITE) test strip Use as instructed 100 each 12  . ibuprofen (ADVIL) 200 MG tablet Take 800 mg by mouth every 8 (eight) hours as needed for headache or moderate pain.     . Insulin Glargine (BASAGLAR KWIKPEN) 100 UNIT/ML INJECT 0.2 MLS (20 UNITS TOTAL) INTO THE SKIN IN THE MORNING. 9 mL 2  . insulin lispro (HUMALOG) 100 UNIT/ML injection Inject 0-0.1 mLs  (0-10 Units total) into the skin 3 (three) times daily with meals. 10 mL 2  . Lancets (FREESTYLE) lancets Use as instructed 100 each 12  . Melatonin 10 MG CAPS Take 30 mg by mouth at bedtime as needed (sleep).     . metFORMIN (GLUCOPHAGE) 1000 MG tablet Take 1 tablet (1,000 mg total) by mouth 2 (two) times daily with a meal.  180 tablet 3  . metroNIDAZOLE (FLAGYL) 500 MG tablet Take 1 tablet (500 mg total) by mouth every 8 (eight) hours. 126 tablet 0  . mirtazapine (REMERON) 30 MG tablet Take 1 tablet (30 mg total) by mouth at bedtime. 90 tablet 3  . montelukast (SINGULAIR) 10 MG tablet Take 1 tablet (10 mg total) by mouth at bedtime. (Patient taking differently: Take 10 mg by mouth daily. ) 90 tablet 3  . naltrexone (DEPADE) 50 MG tablet Take 1 tablet (50 mg total) by mouth daily. 30 tablet 0  . nitroGLYCERIN (NITRODUR - DOSED IN MG/24 HR) 0.2 mg/hr patch Place 1 patch (0.2 mg total) onto the skin daily. (Patient taking differently: Place 0.1 mg onto the skin daily. ) 30 patch 12  . pentoxifylline (TRENTAL) 400 MG CR tablet Take 1 tablet (400 mg total) by mouth 3 (three) times daily with meals. 90 tablet 3  . silver sulfADIAZINE (SILVADENE) 1 % cream Apply 1 application topically daily. Apply to affected area daily plus dry dressing 400 g 3  . sitaGLIPtin (JANUVIA) 100 MG tablet Take 1 tablet (100 mg total) by mouth daily. 30 tablet 10  . Valerian Root 450 MG CAPS Take 900 tablets by mouth at bedtime.      No current facility-administered medications for this visit.    REVIEW OF SYSTEMS:  [X]  denotes positive finding, [ ]  denotes negative finding Cardiac  Comments:  Chest pain or chest pressure:    Shortness of breath upon exertion:    Short of breath when lying flat:    Irregular heart rhythm:        Vascular    Pain in calf, thigh, or hip brought on by ambulation:    Pain in feet at night that wakes you up from your sleep:     Blood clot in your veins:    Leg swelling:           Pulmonary    Oxygen at home:    Productive cough:     Wheezing:         Neurologic    Sudden weakness in arms or legs:     Sudden numbness in arms or legs:     Sudden onset of difficulty speaking or slurred speech:    Temporary loss of vision in one eye:     Problems with dizziness:         Gastrointestinal    Blood in stool:     Vomited blood:         Genitourinary    Burning when urinating:     Blood in urine:        Psychiatric    Major depression:         Hematologic    Bleeding problems:    Problems with blood clotting too easily:        Skin    Rashes or ulcers:        Constitutional    Fever or chills:      PHYSICAL EXAM: Vitals:   05/14/20 0903  BP: 99/69  Pulse: 92  Resp: 18  Temp: 98.1 F (36.7 C)  TempSrc: Temporal  SpO2: 95%  Weight: 265 lb (120.2 kg)  Height: 6\' 3"  (1.905 m)    GENERAL: The patient is a well-nourished male, in no acute distress. The vital signs are documented above. CARDIAC: There is a regular rate and rhythm.  VASCULAR:  Right DP and PT palpable Healthy wound base in TMA as  pictured below, wound smaller, clear drainage, no signs of infection        DATA:   None  Assessment/Plan:  49 year old male status post right TMA debridement for nonhealing TMA that was initially performed by orthopedic surgery.  He has completed his IV antibiotics for osteomyelitis.  Overall the wound is making good progress and continues to get smaller.  Tissue looks good with granulation and clear drainage.  Discussed that while we have wound vac would continue that for 1 more month to facilitate healing, even though he would probably do fine with a wet-to-dry dressing.  I will see him in 1 month for another wound check.  I think he is making good progress.   [name], MD [facility] Office: [phone]

## 2020-05-15 DIAGNOSIS — J449 Chronic obstructive pulmonary disease, unspecified: Secondary | ICD-10-CM | POA: Diagnosis not present

## 2020-05-15 DIAGNOSIS — T8743 Infection of amputation stump, right lower extremity: Secondary | ICD-10-CM | POA: Diagnosis not present

## 2020-05-15 DIAGNOSIS — T8789 Other complications of amputation stump: Secondary | ICD-10-CM | POA: Diagnosis not present

## 2020-05-15 DIAGNOSIS — I10 Essential (primary) hypertension: Secondary | ICD-10-CM | POA: Diagnosis not present

## 2020-05-15 DIAGNOSIS — E1151 Type 2 diabetes mellitus with diabetic peripheral angiopathy without gangrene: Secondary | ICD-10-CM | POA: Diagnosis not present

## 2020-05-15 MED FILL — FREESTYLE LITE TEST STRIP: 30 days supply | Qty: 100 | Fill #0

## 2020-05-17 DIAGNOSIS — E1151 Type 2 diabetes mellitus with diabetic peripheral angiopathy without gangrene: Secondary | ICD-10-CM | POA: Diagnosis not present

## 2020-05-17 DIAGNOSIS — I10 Essential (primary) hypertension: Secondary | ICD-10-CM | POA: Diagnosis not present

## 2020-05-17 DIAGNOSIS — T8743 Infection of amputation stump, right lower extremity: Secondary | ICD-10-CM | POA: Diagnosis not present

## 2020-05-17 DIAGNOSIS — J449 Chronic obstructive pulmonary disease, unspecified: Secondary | ICD-10-CM | POA: Diagnosis not present

## 2020-05-17 DIAGNOSIS — T8789 Other complications of amputation stump: Secondary | ICD-10-CM | POA: Diagnosis not present

## 2020-05-17 DIAGNOSIS — T8189XA Other complications of procedures, not elsewhere classified, initial encounter: Secondary | ICD-10-CM | POA: Diagnosis not present

## 2020-05-20 DIAGNOSIS — I10 Essential (primary) hypertension: Secondary | ICD-10-CM | POA: Diagnosis not present

## 2020-05-20 DIAGNOSIS — T8743 Infection of amputation stump, right lower extremity: Secondary | ICD-10-CM | POA: Diagnosis not present

## 2020-05-20 DIAGNOSIS — E1151 Type 2 diabetes mellitus with diabetic peripheral angiopathy without gangrene: Secondary | ICD-10-CM | POA: Diagnosis not present

## 2020-05-20 DIAGNOSIS — T8789 Other complications of amputation stump: Secondary | ICD-10-CM | POA: Diagnosis not present

## 2020-05-20 DIAGNOSIS — J449 Chronic obstructive pulmonary disease, unspecified: Secondary | ICD-10-CM | POA: Diagnosis not present

## 2020-05-22 DIAGNOSIS — T8743 Infection of amputation stump, right lower extremity: Secondary | ICD-10-CM | POA: Diagnosis not present

## 2020-05-22 DIAGNOSIS — I10 Essential (primary) hypertension: Secondary | ICD-10-CM | POA: Diagnosis not present

## 2020-05-22 DIAGNOSIS — E1151 Type 2 diabetes mellitus with diabetic peripheral angiopathy without gangrene: Secondary | ICD-10-CM | POA: Diagnosis not present

## 2020-05-22 DIAGNOSIS — J449 Chronic obstructive pulmonary disease, unspecified: Secondary | ICD-10-CM | POA: Diagnosis not present

## 2020-05-22 DIAGNOSIS — T8789 Other complications of amputation stump: Secondary | ICD-10-CM | POA: Diagnosis not present

## 2020-05-23 MED FILL — BASAGLAR 100 UNIT/ML KWIKPE: 100 | 45 days supply | Qty: 9 | Fill #0

## 2020-05-23 MED FILL — DEXCOM G6 SENSOR MISC: 30 days supply | Qty: 3 | Fill #1

## 2020-05-24 ENCOUNTER — Telehealth: Payer: Self-pay

## 2020-05-24 DIAGNOSIS — T8789 Other complications of amputation stump: Secondary | ICD-10-CM | POA: Diagnosis not present

## 2020-05-24 DIAGNOSIS — E1151 Type 2 diabetes mellitus with diabetic peripheral angiopathy without gangrene: Secondary | ICD-10-CM | POA: Diagnosis not present

## 2020-05-24 DIAGNOSIS — T8743 Infection of amputation stump, right lower extremity: Secondary | ICD-10-CM | POA: Diagnosis not present

## 2020-05-24 DIAGNOSIS — J449 Chronic obstructive pulmonary disease, unspecified: Secondary | ICD-10-CM | POA: Diagnosis not present

## 2020-05-24 DIAGNOSIS — I10 Essential (primary) hypertension: Secondary | ICD-10-CM | POA: Diagnosis not present

## 2020-05-24 NOTE — Progress Notes (Signed)
POST OPERATIVE OFFICE NOTE    CC:  F/u for surgery  HPI:  This is a 49 y.o. male who is s/p sharp excisional debridement of the right TMA and application of vac on 8/36/6294 by Dr. Carlis Abbott.  His initial TMA was performed by orthopedics.  He was last seen on 05/14/2020 by Dr. Carlis Abbott and at that time, his wound was progressing.  he continued to have a wound vac.   Pt had completed his IV abx for osteomyelitis.  He was not having any fevers or foul smell.    Pt's HHRN called on 05/24/2020 stating the foot was having a mild foul odor.  He was offered an appt but did not have transportation and comes in today for evaluation.    He states that the [facility] felt there was a mild odor and wanted him to follow up.  He states that he has had some new neuropathy over the past 4-5 days.  He has had some swelling in the right leg but it is not too bad today per pt.  He states it has had some drainage that is pink in color.  Not too much drainage in the vac container per pt.  He has not had any fever or chills but has had a couple of nights of sweating.  He continues to walk with his Darco shoe.    Allergies  Allergen Reactions  . Eggs Or Egg-Derived Products Rash  . Morphine And Related Other (See Comments)    Cant take because of pancreatitis  . Cocoa Rash    Current Outpatient Medications  Medication Sig Dispense Refill  . FREESTYLE LITE test strip USE AS INSTRUCTED 100 strip 12  . albuterol (VENTOLIN HFA) 108 (90 Base) MCG/ACT inhaler Inhale 2-4 puffs into the lungs every 4 (four) hours as needed for wheezing (or cough). (Patient taking differently: Inhale 2 puffs into the lungs every 4 (four) hours as needed for wheezing (or cough). ) 6.7 g 11  . ARIPiprazole (ABILIFY) 5 MG tablet Take 1 tablet (5 mg total) by mouth daily. 90 tablet 2  . atorvastatin (LIPITOR) 40 MG tablet Take 1 tablet (40 mg total) by mouth daily. 90 tablet 3  . bismuth subsalicylate (PEPTO BISMOL) 262 MG/15ML suspension Take 30 mLs by  mouth every 6 (six) hours as needed for indigestion or diarrhea or loose stools.    . Continuous Blood Gluc Receiver (DEXCOM G6 RECEIVER) DEVI 1 Device by Does not apply route 4 (four) times daily. 1 each 11  . Continuous Blood Gluc Sensor (DEXCOM G6 SENSOR) MISC 1 Device by Does not apply route 4 (four) times daily. 3 each 11  . Continuous Blood Gluc Transmit (DEXCOM G6 TRANSMITTER) MISC 1 Device by Does not apply route 4 (four) times daily. 1 each 11  . diphenhydrAMINE (BENADRYL) 25 MG tablet Take 25 mg by mouth every 6 (six) hours as needed for itching or allergies.     . DULoxetine (CYMBALTA) 60 MG capsule Take 1 capsule (60 mg total) by mouth daily. 90 capsule 2  . EASY TOUCH PEN NEEDLES 31G X 5 MM MISC Inject 1 application as directed 3 (three) times daily. 100 each 11  . empagliflozin (JARDIANCE) 25 MG TABS tablet Take 25 mg by mouth daily. 90 tablet 3  . fluticasone (FLONASE) 50 MCG/ACT nasal spray Place 1 spray into both nostrils daily as needed for allergies or rhinitis.    . Fluticasone-Salmeterol (ADVAIR DISKUS) 250-50 MCG/DOSE AEPB Inhale 1 puff into the lungs  2 (two) times daily. 180 each 3  . gabapentin (NEURONTIN) 300 MG capsule TAKE 1 CAPSULE (300 MG TOTAL) BY MOUTH 3 (THREE) TIMES DAILY. 90 capsule 2  . ibuprofen (ADVIL) 200 MG tablet Take 800 mg by mouth every 8 (eight) hours as needed for headache or moderate pain.     . Insulin Glargine (BASAGLAR KWIKPEN) 100 UNIT/ML INJECT 0.2 MLS (20 UNITS TOTAL) INTO THE SKIN IN THE MORNING. 9 mL 2  . insulin lispro (HUMALOG) 100 UNIT/ML injection Inject 0-0.1 mLs (0-10 Units total) into the skin 3 (three) times daily with meals. 10 mL 2  . Lancets (FREESTYLE) lancets Use as instructed 100 each 12  . Melatonin 10 MG CAPS Take 30 mg by mouth at bedtime as needed (sleep).     . metFORMIN (GLUCOPHAGE) 1000 MG tablet Take 1 tablet (1,000 mg total) by mouth 2 (two) times daily with a meal. 180 tablet 3  . metroNIDAZOLE (FLAGYL) 500 MG tablet Take  1 tablet (500 mg total) by mouth every 8 (eight) hours. 126 tablet 0  . mirtazapine (REMERON) 30 MG tablet Take 1 tablet (30 mg total) by mouth at bedtime. 90 tablet 3  . montelukast (SINGULAIR) 10 MG tablet Take 1 tablet (10 mg total) by mouth at bedtime. (Patient taking differently: Take 10 mg by mouth daily. ) 90 tablet 3  . naltrexone (DEPADE) 50 MG tablet Take 1 tablet (50 mg total) by mouth daily. 30 tablet 0  . nitroGLYCERIN (NITRODUR - DOSED IN MG/24 HR) 0.2 mg/hr patch Place 1 patch (0.2 mg total) onto the skin daily. (Patient taking differently: Place 0.1 mg onto the skin daily. ) 30 patch 12  . pentoxifylline (TRENTAL) 400 MG CR tablet Take 1 tablet (400 mg total) by mouth 3 (three) times daily with meals. 90 tablet 3  . silver sulfADIAZINE (SILVADENE) 1 % cream Apply 1 application topically daily. Apply to affected area daily plus dry dressing 400 g 3  . sitaGLIPtin (JANUVIA) 100 MG tablet Take 1 tablet (100 mg total) by mouth daily. 30 tablet 10  . Valerian Root 450 MG CAPS Take 900 tablets by mouth at bedtime.      No current facility-administered medications for this visit.     ROS:  See HPI  Physical Exam:  Today's Vitals   05/27/20 1233  BP: 106/78  Pulse: 82  Resp: 20  Temp: 98.7 F (37.1 C)  TempSrc: Temporal  SpO2: 97%  Weight: 256 lb 11.2 oz (116.4 kg)  Height: 6\' 3"  (1.905 m)  PainSc: 9   PainLoc: Foot   Body mass index is 32.09 kg/m.  General: WDWN male in no distress Cardiac:  Regular rhythm Pulmonary:  Non labored breathing Neuro:  Alert and oriented x 3 Incision:        Extremities:  Brisk biphasic DP/PT and triphasic peroneal doppler signals right foot; minimal swelling   Assessment/Plan:  This is a 49 y.o. male who is s/p: sharp excisional debridement of the right TMA and application of vac on 2/77/8242 by Dr. Carlis Abbott.  His initial TMA was performed by orthopedics and comes in today for a wound check  -no overt signs of infection but there  is a slight odor to the wound.  Will give Keflex 500mg  bid x 7 days.  He continues to have brisk doppler signals right foot.   -HHRN will not be back to his house until Wednesday.  Will do wet to dry saline dressing until then.   -pt states  he was using NTG patches on his right foot but ran out.  He really cannot tell a difference and he has brisk doppler signals.  Would continue to hold on these. -he has f/u appt with Dr. Carlis Abbott in a couple of weeks.  He will keep that appt.  He will call sooner if has any issues before then.    Leontine Locket, [facility] [phone]  Clinic MD:  Trula Slade

## 2020-05-24 NOTE — Telephone Encounter (Signed)
Pt's [facility] nurse called with c/o pt having mild foul odor at TMA site with wound vac in place. Pt was offered an appt to be seen today but does not have transportation. He is going to come in Monday to see APP.

## 2020-05-27 ENCOUNTER — Ambulatory Visit (INDEPENDENT_AMBULATORY_CARE_PROVIDER_SITE_OTHER): Payer: 59 | Admitting: Physician Assistant

## 2020-05-27 ENCOUNTER — Other Ambulatory Visit: Payer: Self-pay

## 2020-05-27 VITALS — BP 106/78 | HR 82 | Temp 98.7°F | Resp 20 | Ht 75.0 in | Wt 256.7 lb

## 2020-05-27 DIAGNOSIS — Z89431 Acquired absence of right foot: Secondary | ICD-10-CM | POA: Diagnosis not present

## 2020-05-27 MED ORDER — CEPHALEXIN 500 MG PO CAPS: 500.0000 mg | ORAL_CAPSULE | Freq: Two times a day (BID) | ORAL | 0 refills | Status: DC

## 2020-05-27 MED FILL — CEPHALEXIN 500 MG CAPSULE: 500 | 7 days supply | Qty: 14 | Fill #0

## 2020-05-29 DIAGNOSIS — E1151 Type 2 diabetes mellitus with diabetic peripheral angiopathy without gangrene: Secondary | ICD-10-CM | POA: Diagnosis not present

## 2020-05-29 DIAGNOSIS — I10 Essential (primary) hypertension: Secondary | ICD-10-CM | POA: Diagnosis not present

## 2020-05-29 DIAGNOSIS — T8789 Other complications of amputation stump: Secondary | ICD-10-CM | POA: Diagnosis not present

## 2020-05-29 DIAGNOSIS — J449 Chronic obstructive pulmonary disease, unspecified: Secondary | ICD-10-CM | POA: Diagnosis not present

## 2020-05-29 DIAGNOSIS — T8743 Infection of amputation stump, right lower extremity: Secondary | ICD-10-CM | POA: Diagnosis not present

## 2020-05-31 DIAGNOSIS — T8743 Infection of amputation stump, right lower extremity: Secondary | ICD-10-CM | POA: Diagnosis not present

## 2020-05-31 DIAGNOSIS — T8789 Other complications of amputation stump: Secondary | ICD-10-CM | POA: Diagnosis not present

## 2020-05-31 DIAGNOSIS — J449 Chronic obstructive pulmonary disease, unspecified: Secondary | ICD-10-CM | POA: Diagnosis not present

## 2020-05-31 DIAGNOSIS — I10 Essential (primary) hypertension: Secondary | ICD-10-CM | POA: Diagnosis not present

## 2020-05-31 DIAGNOSIS — E1151 Type 2 diabetes mellitus with diabetic peripheral angiopathy without gangrene: Secondary | ICD-10-CM | POA: Diagnosis not present

## 2020-06-03 DIAGNOSIS — I10 Essential (primary) hypertension: Secondary | ICD-10-CM | POA: Diagnosis not present

## 2020-06-03 DIAGNOSIS — T8789 Other complications of amputation stump: Secondary | ICD-10-CM | POA: Diagnosis not present

## 2020-06-03 DIAGNOSIS — J449 Chronic obstructive pulmonary disease, unspecified: Secondary | ICD-10-CM | POA: Diagnosis not present

## 2020-06-03 DIAGNOSIS — T8743 Infection of amputation stump, right lower extremity: Secondary | ICD-10-CM | POA: Diagnosis not present

## 2020-06-03 DIAGNOSIS — E1151 Type 2 diabetes mellitus with diabetic peripheral angiopathy without gangrene: Secondary | ICD-10-CM | POA: Diagnosis not present

## 2020-06-05 ENCOUNTER — Telehealth: Payer: Self-pay

## 2020-06-05 ENCOUNTER — Encounter: Payer: 59 | Admitting: Dietician

## 2020-06-05 DIAGNOSIS — T8789 Other complications of amputation stump: Secondary | ICD-10-CM | POA: Diagnosis not present

## 2020-06-05 DIAGNOSIS — I10 Essential (primary) hypertension: Secondary | ICD-10-CM | POA: Diagnosis not present

## 2020-06-05 DIAGNOSIS — T8743 Infection of amputation stump, right lower extremity: Secondary | ICD-10-CM | POA: Diagnosis not present

## 2020-06-05 DIAGNOSIS — J449 Chronic obstructive pulmonary disease, unspecified: Secondary | ICD-10-CM | POA: Diagnosis not present

## 2020-06-05 DIAGNOSIS — E1151 Type 2 diabetes mellitus with diabetic peripheral angiopathy without gangrene: Secondary | ICD-10-CM | POA: Diagnosis not present

## 2020-06-05 NOTE — Telephone Encounter (Signed)
Pt's [place] nurse called with questions regarding d/c the wound vac. He is on his last pill of antibiotic today and infection seems to be resolved. She feels minimal improvement, if any, has occurred on his wound which she states is very small (approx. 1x1). Per PA, without seeing it we have advised her to leave wound vac in place until pt is seen in office this coming week. She verbalized understanding.

## 2020-06-07 DIAGNOSIS — J449 Chronic obstructive pulmonary disease, unspecified: Secondary | ICD-10-CM | POA: Diagnosis not present

## 2020-06-07 DIAGNOSIS — I10 Essential (primary) hypertension: Secondary | ICD-10-CM | POA: Diagnosis not present

## 2020-06-07 DIAGNOSIS — T8743 Infection of amputation stump, right lower extremity: Secondary | ICD-10-CM | POA: Diagnosis not present

## 2020-06-07 DIAGNOSIS — E1151 Type 2 diabetes mellitus with diabetic peripheral angiopathy without gangrene: Secondary | ICD-10-CM | POA: Diagnosis not present

## 2020-06-07 DIAGNOSIS — T8789 Other complications of amputation stump: Secondary | ICD-10-CM | POA: Diagnosis not present

## 2020-06-10 ENCOUNTER — Other Ambulatory Visit: Payer: Self-pay | Admitting: Family Medicine

## 2020-06-10 DIAGNOSIS — T8789 Other complications of amputation stump: Secondary | ICD-10-CM | POA: Diagnosis not present

## 2020-06-10 DIAGNOSIS — J449 Chronic obstructive pulmonary disease, unspecified: Secondary | ICD-10-CM | POA: Diagnosis not present

## 2020-06-10 DIAGNOSIS — T8743 Infection of amputation stump, right lower extremity: Secondary | ICD-10-CM | POA: Diagnosis not present

## 2020-06-10 DIAGNOSIS — I10 Essential (primary) hypertension: Secondary | ICD-10-CM | POA: Diagnosis not present

## 2020-06-10 DIAGNOSIS — E1151 Type 2 diabetes mellitus with diabetic peripheral angiopathy without gangrene: Secondary | ICD-10-CM | POA: Diagnosis not present

## 2020-06-10 MED FILL — GABAPENTIN 300 MG CAPSULE: 300 | 30 days supply | Qty: 90 | Fill #2

## 2020-06-10 MED FILL — MIRTAZAPINE 30 MG TABLET: 30 | 90 days supply | Qty: 90 | Fill #2

## 2020-06-11 ENCOUNTER — Other Ambulatory Visit: Payer: Self-pay | Admitting: Family Medicine

## 2020-06-11 ENCOUNTER — Ambulatory Visit (INDEPENDENT_AMBULATORY_CARE_PROVIDER_SITE_OTHER): Payer: 59 | Admitting: Vascular Surgery

## 2020-06-11 ENCOUNTER — Other Ambulatory Visit: Payer: Self-pay

## 2020-06-11 ENCOUNTER — Encounter: Payer: Self-pay | Admitting: Vascular Surgery

## 2020-06-11 VITALS — BP 108/74 | HR 83 | Temp 97.8°F | Resp 18 | Ht 75.0 in | Wt 265.0 lb

## 2020-06-11 DIAGNOSIS — Z89431 Acquired absence of right foot: Secondary | ICD-10-CM

## 2020-06-11 MED FILL — metFORMIN HCL 1000 MG TABS: 1000 | 90 days supply | Qty: 180 | Fill #0

## 2020-06-11 NOTE — Progress Notes (Signed)
Patient name: Christopher Fernandez MRN: [id number] DOB: July 23, 1971 Sex: male  REASON FOR VISIT: continued follow-up for wound check right TMA  HPI: Christopher Fernandez is a 49 y.o. male with diabetes, HTN that presents for ongoing wound check of his right foot.  He had a nonhealing right transmetatarsal amputation performed by orthopedics and I saw him for a second opinion.  I subsequently took him to the operating room and did sharp excisional debridement of the TMA amputation with VAC placement and also got him on IV antibiotics for osteomyelitis which is now completed.  He remains in a VAC and overall feels like the wound is making progress.  Was recently on some oral antibiotics with concern for infection.  Had an MRI of the foot in June 2021 with no signs of osteomyelitis.      Past Medical History:  Diagnosis Date  . Alcoholism ([place])   . Anemia   . Anxiety   . Arthritis   . Asthma   . COPD (chronic obstructive pulmonary disease) ([place])   . Depression   . Diabetes ([place])    type 2  . GERD (gastroesophageal reflux disease)   . History of hiatal hernia   . HTN (hypertension)   . Osteomyelitis ([place])    right forefoot  . Pancreatitis   . Pneumonia   . Wears glasses     Past Surgical History:  Procedure Laterality Date  . 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 AMPUTATION Right 10/18/2019   Procedure: RIGHT SECOND TOE AMPUTATION;  Surgeon: Newt Minion, MD;  Location: [place];  Service: Orthopedics;  Laterality: Right;  . AMPUTATION Right 11/08/2019   Procedure: RIGHT TRANSMETATARSAL AMPUTATION;  Surgeon: Newt Minion, MD;  Location: [place];  Service: Orthopedics;  Laterality: Right;  . APPENDECTOMY    . APPLICATION OF WOUND VAC Right 01/10/2020   Procedure: APPLICATION OF WOUND VAC, right foot;  Surgeon: [name], MD;  Location: [place];  Service: Vascular;  Laterality: Right;  . CHOLECYSTECTOMY  2006   with gallbladder and spleen  . COLONOSCOPY  8-10 years ago     in [place] exam per pt  . HERNIA REPAIR  2008, 2010   hiatal hernia and 1 additional  . NASAL SEPTUM SURGERY    . PANCREATIC PSEUDOCYST DRAINAGE    . SPLENECTOMY  2006  . STUMP REVISION Right 11/24/2019   Procedure: REVISION RIGHT TRANSMETATARSAL AMPUTATION;  Surgeon: Newt Minion, MD;  Location: [place];  Service: Orthopedics;  Laterality: Right;  . TOTAL KNEE ARTHROPLASTY Right 1982, 1984  . WOUND DEBRIDEMENT Right 01/10/2020   Procedure: incisional DEBRIDEMENT of RIGHT TRANSMETATARSAL WOUND;  Surgeon: [name], MD;  Location: [facility];  Service: Vascular;  Laterality: Right;    Family History  Problem Relation Age of Onset  . Diabetes Mother   . Hypertension Mother   . Hyperlipidemia Mother   . Kidney disease Mother   . Thyroid disease Mother   . Breast cancer Mother        mets  . Lung cancer Mother   . Diabetes Father   . Alcohol abuse Sister   . Sickle cell trait Sister   . Diabetes Brother   . Asthma Brother   . Lung cancer Maternal Grandmother   . Kidney disease Maternal Grandmother   . Liver cancer Maternal Uncle        x 4-5  . Kidney disease Maternal Uncle   .  Kidney disease Paternal Uncle   . Colon cancer Neg Hx   . Colon polyps Neg Hx   . Esophageal cancer Neg Hx   . Rectal cancer Neg Hx   . Stomach cancer Neg Hx     SOCIAL HISTORY: Social History   Tobacco Use  . Smoking status: Current Every Day Smoker    Packs/day: 0.50    Types: Cigarettes  . Smokeless tobacco: Never Used  Substance Use Topics  . Alcohol use: Not Currently    Comment: 2-3 per day, none since 2019    Allergies  Allergen Reactions  . Eggs Or Egg-Derived Products Rash  . Morphine And Related Other (See Comments)    Cant take because of pancreatitis  . Cocoa Rash    Current Outpatient Medications  Medication Sig Dispense Refill  . albuterol (VENTOLIN HFA) 108 (90 Base) MCG/ACT inhaler Inhale 2-4 puffs into the lungs every 4 (four) hours as needed for wheezing  (or cough). (Patient taking differently: Inhale 2 puffs into the lungs every 4 (four) hours as needed for wheezing (or cough). ) 6.7 g 11  . ARIPiprazole (ABILIFY) 5 MG tablet Take 1 tablet (5 mg total) by mouth daily. 90 tablet 2  . atorvastatin (LIPITOR) 40 MG tablet Take 1 tablet (40 mg total) by mouth daily. 90 tablet 3  . bismuth subsalicylate (PEPTO BISMOL) 262 MG/15ML suspension Take 30 mLs by mouth every 6 (six) hours as needed for indigestion or diarrhea or loose stools.    . Continuous Blood Gluc Receiver (DEXCOM G6 RECEIVER) DEVI 1 Device by Does not apply route 4 (four) times daily. 1 each 11  . Continuous Blood Gluc Sensor (DEXCOM G6 SENSOR) MISC 1 Device by Does not apply route 4 (four) times daily. 3 each 11  . Continuous Blood Gluc Transmit (DEXCOM G6 TRANSMITTER) MISC 1 Device by Does not apply route 4 (four) times daily. 1 each 11  . diphenhydrAMINE (BENADRYL) 25 MG tablet Take 25 mg by mouth every 6 (six) hours as needed for itching or allergies.     . DULoxetine (CYMBALTA) 60 MG capsule Take 1 capsule (60 mg total) by mouth daily. 90 capsule 2  . EASY TOUCH PEN NEEDLES 31G X 5 MM MISC Inject 1 application as directed 3 (three) times daily. 100 each 11  . empagliflozin (JARDIANCE) 25 MG TABS tablet Take 25 mg by mouth daily. 90 tablet 3  . fluticasone (FLONASE) 50 MCG/ACT nasal spray Place 1 spray into both nostrils daily as needed for allergies or rhinitis.    . Fluticasone-Salmeterol (ADVAIR DISKUS) 250-50 MCG/DOSE AEPB Inhale 1 puff into the lungs 2 (two) times daily. 180 each 3  . FREESTYLE LITE test strip USE AS INSTRUCTED 100 strip 12  . gabapentin (NEURONTIN) 300 MG capsule TAKE 1 CAPSULE (300 MG TOTAL) BY MOUTH 3 (THREE) TIMES DAILY. 90 capsule 2  . glucose blood test strip USE AS INSTRUCTED    . ibuprofen (ADVIL) 200 MG tablet Take 800 mg by mouth every 8 (eight) hours as needed for headache or moderate pain.     . Insulin Glargine (BASAGLAR KWIKPEN) 100 UNIT/ML INJECT  0.2 MLS (20 UNITS TOTAL) INTO THE SKIN IN THE MORNING. 9 mL 2  . insulin lispro (HUMALOG) 100 UNIT/ML injection Inject 0-0.1 mLs (0-10 Units total) into the skin 3 (three) times daily with meals. 10 mL 2  . Lancets (FREESTYLE) lancets Use as instructed 100 each 12  . Melatonin 10 MG CAPS Take 30 mg by mouth  at bedtime as needed (sleep).     . metFORMIN (GLUCOPHAGE) 1000 MG tablet TAKE 1 TABLET (1,000 MG TOTAL) BY MOUTH 2 (TWO) TIMES DAILY WITH A MEAL. 180 tablet 3  . metroNIDAZOLE (FLAGYL) 500 MG tablet Take 1 tablet (500 mg total) by mouth every 8 (eight) hours. 126 tablet 0  . mirtazapine (REMERON) 30 MG tablet Take 1 tablet (30 mg total) by mouth at bedtime. 90 tablet 3  . montelukast (SINGULAIR) 10 MG tablet Take 1 tablet (10 mg total) by mouth at bedtime. (Patient taking differently: Take 10 mg by mouth daily. ) 90 tablet 3  . naltrexone (DEPADE) 50 MG tablet Take 1 tablet (50 mg total) by mouth daily. 30 tablet 0  . nitroGLYCERIN (NITRODUR - DOSED IN MG/24 HR) 0.2 mg/hr patch Place 1 patch (0.2 mg total) onto the skin daily. (Patient taking differently: Place 0.1 mg onto the skin daily. ) 30 patch 12  . pentoxifylline (TRENTAL) 400 MG CR tablet Take 1 tablet (400 mg total) by mouth 3 (three) times daily with meals. 90 tablet 3  . sitaGLIPtin (JANUVIA) 100 MG tablet Take 1 tablet (100 mg total) by mouth daily. 30 tablet 10  . Valerian Root 450 MG CAPS Take 900 tablets by mouth at bedtime.     . cephALEXin (KEFLEX) 500 MG capsule Take 1 capsule (500 mg total) by mouth 2 (two) times daily. (Patient not taking: Reported on 06/11/2020) 14 capsule 0  . silver sulfADIAZINE (SILVADENE) 1 % cream Apply 1 application topically daily. Apply to affected area daily plus dry dressing (Patient not taking: Reported on 06/11/2020) 400 g 3   No current facility-administered medications for this visit.    REVIEW OF SYSTEMS:  [X]  denotes positive finding, [ ]  denotes negative finding Cardiac  Comments:  Chest  pain or chest pressure:    Shortness of breath upon exertion:    Short of breath when lying flat:    Irregular heart rhythm:        Vascular    Pain in calf, thigh, or hip brought on by ambulation:    Pain in feet at night that wakes you up from your sleep:     Blood clot in your veins:    Leg swelling:         Pulmonary    Oxygen at home:    Productive cough:     Wheezing:         Neurologic    Sudden weakness in arms or legs:     Sudden numbness in arms or legs:     Sudden onset of difficulty speaking or slurred speech:    Temporary loss of vision in one eye:     Problems with dizziness:         Gastrointestinal    Blood in stool:     Vomited blood:         Genitourinary    Burning when urinating:     Blood in urine:        Psychiatric    Major depression:         Hematologic    Bleeding problems:    Problems with blood clotting too easily:        Skin    Rashes or ulcers:        Constitutional    Fever or chills:      PHYSICAL EXAM: Vitals:   06/11/20 0934  BP: 108/74  Pulse: 83  Resp: 18  Temp: 97.8  F (36.6 C)  TempSrc: Temporal  SpO2: 94%  Weight: 265 lb (120.2 kg)  Height: 6\' 3"  (1.905 m)    GENERAL: The patient is a well-nourished male, in no acute distress. The vital signs are documented above. CARDIAC: There is a regular rate and rhythm.  VASCULAR:  Right DP and PT palpable Healthy wound base in TMA as pictured below, continues to heal, no foul drainage, no undermining           DATA:   None  Assessment/Plan:  49 year old male status post right TMA debridement for nonhealing TMA that was initially performed by orthopedic surgery.  He previously completed his IV antibiotics for osteomyelitis.  Overall the wound is making good progress and continues to get smaller.  MRI in 02/2020 with no signs of recurrent or residual osteomyelitis.  Tissue looks good and can transition to wet to dry and discontinue to vac dressing.  Will arrange  follow-up in two months with wound check.  Call with questions or concerns.   [name], MD [facility] [place] Office: [phone]

## 2020-06-12 ENCOUNTER — Encounter: Payer: Self-pay | Admitting: Gastroenterology

## 2020-06-12 DIAGNOSIS — J449 Chronic obstructive pulmonary disease, unspecified: Secondary | ICD-10-CM | POA: Diagnosis not present

## 2020-06-12 DIAGNOSIS — E1151 Type 2 diabetes mellitus with diabetic peripheral angiopathy without gangrene: Secondary | ICD-10-CM | POA: Diagnosis not present

## 2020-06-12 DIAGNOSIS — T8743 Infection of amputation stump, right lower extremity: Secondary | ICD-10-CM | POA: Diagnosis not present

## 2020-06-12 DIAGNOSIS — I10 Essential (primary) hypertension: Secondary | ICD-10-CM | POA: Diagnosis not present

## 2020-06-12 DIAGNOSIS — T8789 Other complications of amputation stump: Secondary | ICD-10-CM | POA: Diagnosis not present

## 2020-06-13 ENCOUNTER — Telehealth: Payer: Self-pay

## 2020-06-13 NOTE — Telephone Encounter (Signed)
[place] @ [phone] and s/w Jacqlyn Larsen, gave new wound care orders for pt - discontinue wound vac and apply wet to dry dressing to R foot wound- Becky stated that the nurse did receive those orders and read them back to me from the nurses documentation

## 2020-06-14 DIAGNOSIS — T8743 Infection of amputation stump, right lower extremity: Secondary | ICD-10-CM | POA: Diagnosis not present

## 2020-06-14 DIAGNOSIS — J449 Chronic obstructive pulmonary disease, unspecified: Secondary | ICD-10-CM | POA: Diagnosis not present

## 2020-06-14 DIAGNOSIS — I10 Essential (primary) hypertension: Secondary | ICD-10-CM | POA: Diagnosis not present

## 2020-06-14 DIAGNOSIS — T8789 Other complications of amputation stump: Secondary | ICD-10-CM | POA: Diagnosis not present

## 2020-06-14 DIAGNOSIS — E1151 Type 2 diabetes mellitus with diabetic peripheral angiopathy without gangrene: Secondary | ICD-10-CM | POA: Diagnosis not present

## 2020-06-17 DIAGNOSIS — I10 Essential (primary) hypertension: Secondary | ICD-10-CM | POA: Diagnosis not present

## 2020-06-17 DIAGNOSIS — T8743 Infection of amputation stump, right lower extremity: Secondary | ICD-10-CM | POA: Diagnosis not present

## 2020-06-17 DIAGNOSIS — E1151 Type 2 diabetes mellitus with diabetic peripheral angiopathy without gangrene: Secondary | ICD-10-CM | POA: Diagnosis not present

## 2020-06-17 DIAGNOSIS — T8789 Other complications of amputation stump: Secondary | ICD-10-CM | POA: Diagnosis not present

## 2020-06-17 DIAGNOSIS — J449 Chronic obstructive pulmonary disease, unspecified: Secondary | ICD-10-CM | POA: Diagnosis not present

## 2020-06-17 DIAGNOSIS — T8149XA Infection following a procedure, other surgical site, initial encounter: Secondary | ICD-10-CM | POA: Diagnosis not present

## 2020-06-19 DIAGNOSIS — T8743 Infection of amputation stump, right lower extremity: Secondary | ICD-10-CM | POA: Diagnosis not present

## 2020-06-19 DIAGNOSIS — J449 Chronic obstructive pulmonary disease, unspecified: Secondary | ICD-10-CM | POA: Diagnosis not present

## 2020-06-19 DIAGNOSIS — I10 Essential (primary) hypertension: Secondary | ICD-10-CM | POA: Diagnosis not present

## 2020-06-19 DIAGNOSIS — E1151 Type 2 diabetes mellitus with diabetic peripheral angiopathy without gangrene: Secondary | ICD-10-CM | POA: Diagnosis not present

## 2020-06-19 DIAGNOSIS — T8789 Other complications of amputation stump: Secondary | ICD-10-CM | POA: Diagnosis not present

## 2020-06-20 ENCOUNTER — Other Ambulatory Visit: Payer: Self-pay | Admitting: Family Medicine

## 2020-06-20 MED FILL — DEXCOM G6 TRANSMITTER MISC: 90 days supply | Qty: 1 | Fill #1

## 2020-06-20 MED FILL — DEXCOM G6 SENSOR MISC: 30 days supply | Qty: 3 | Fill #2

## 2020-06-21 DIAGNOSIS — J449 Chronic obstructive pulmonary disease, unspecified: Secondary | ICD-10-CM | POA: Diagnosis not present

## 2020-06-21 DIAGNOSIS — T8743 Infection of amputation stump, right lower extremity: Secondary | ICD-10-CM | POA: Diagnosis not present

## 2020-06-21 DIAGNOSIS — E1151 Type 2 diabetes mellitus with diabetic peripheral angiopathy without gangrene: Secondary | ICD-10-CM | POA: Diagnosis not present

## 2020-06-21 DIAGNOSIS — T8789 Other complications of amputation stump: Secondary | ICD-10-CM | POA: Diagnosis not present

## 2020-06-21 DIAGNOSIS — I10 Essential (primary) hypertension: Secondary | ICD-10-CM | POA: Diagnosis not present

## 2020-06-23 ENCOUNTER — Other Ambulatory Visit: Payer: Self-pay | Admitting: Family Medicine

## 2020-06-23 NOTE — Telephone Encounter (Signed)
Will provide refill but please have patient schedule follow up appt at earliest convenience for diabetes follow up. Thank you!!

## 2020-06-24 DIAGNOSIS — T8743 Infection of amputation stump, right lower extremity: Secondary | ICD-10-CM | POA: Diagnosis not present

## 2020-06-24 DIAGNOSIS — E1151 Type 2 diabetes mellitus with diabetic peripheral angiopathy without gangrene: Secondary | ICD-10-CM | POA: Diagnosis not present

## 2020-06-24 DIAGNOSIS — J449 Chronic obstructive pulmonary disease, unspecified: Secondary | ICD-10-CM | POA: Diagnosis not present

## 2020-06-24 DIAGNOSIS — I10 Essential (primary) hypertension: Secondary | ICD-10-CM | POA: Diagnosis not present

## 2020-06-24 DIAGNOSIS — T8789 Other complications of amputation stump: Secondary | ICD-10-CM | POA: Diagnosis not present

## 2020-06-24 MED FILL — HUMALOG 100 UNITS/ML KWIKPE: 100 | 30 days supply | Qty: 9 | Fill #0

## 2020-06-26 DIAGNOSIS — I10 Essential (primary) hypertension: Secondary | ICD-10-CM | POA: Diagnosis not present

## 2020-06-26 DIAGNOSIS — E1151 Type 2 diabetes mellitus with diabetic peripheral angiopathy without gangrene: Secondary | ICD-10-CM | POA: Diagnosis not present

## 2020-06-26 DIAGNOSIS — T8743 Infection of amputation stump, right lower extremity: Secondary | ICD-10-CM | POA: Diagnosis not present

## 2020-06-26 DIAGNOSIS — T8789 Other complications of amputation stump: Secondary | ICD-10-CM | POA: Diagnosis not present

## 2020-06-26 DIAGNOSIS — J449 Chronic obstructive pulmonary disease, unspecified: Secondary | ICD-10-CM | POA: Diagnosis not present

## 2020-06-28 DIAGNOSIS — T8789 Other complications of amputation stump: Secondary | ICD-10-CM | POA: Diagnosis not present

## 2020-06-28 DIAGNOSIS — T8743 Infection of amputation stump, right lower extremity: Secondary | ICD-10-CM | POA: Diagnosis not present

## 2020-06-28 DIAGNOSIS — J449 Chronic obstructive pulmonary disease, unspecified: Secondary | ICD-10-CM | POA: Diagnosis not present

## 2020-06-28 DIAGNOSIS — E1151 Type 2 diabetes mellitus with diabetic peripheral angiopathy without gangrene: Secondary | ICD-10-CM | POA: Diagnosis not present

## 2020-06-28 DIAGNOSIS — I10 Essential (primary) hypertension: Secondary | ICD-10-CM | POA: Diagnosis not present

## 2020-07-01 DIAGNOSIS — E1151 Type 2 diabetes mellitus with diabetic peripheral angiopathy without gangrene: Secondary | ICD-10-CM | POA: Diagnosis not present

## 2020-07-01 DIAGNOSIS — I10 Essential (primary) hypertension: Secondary | ICD-10-CM | POA: Diagnosis not present

## 2020-07-01 DIAGNOSIS — T8743 Infection of amputation stump, right lower extremity: Secondary | ICD-10-CM | POA: Diagnosis not present

## 2020-07-01 DIAGNOSIS — J449 Chronic obstructive pulmonary disease, unspecified: Secondary | ICD-10-CM | POA: Diagnosis not present

## 2020-07-01 DIAGNOSIS — T8789 Other complications of amputation stump: Secondary | ICD-10-CM | POA: Diagnosis not present

## 2020-07-03 DIAGNOSIS — I10 Essential (primary) hypertension: Secondary | ICD-10-CM | POA: Diagnosis not present

## 2020-07-03 DIAGNOSIS — E1151 Type 2 diabetes mellitus with diabetic peripheral angiopathy without gangrene: Secondary | ICD-10-CM | POA: Diagnosis not present

## 2020-07-03 DIAGNOSIS — J449 Chronic obstructive pulmonary disease, unspecified: Secondary | ICD-10-CM | POA: Diagnosis not present

## 2020-07-03 DIAGNOSIS — T8789 Other complications of amputation stump: Secondary | ICD-10-CM | POA: Diagnosis not present

## 2020-07-03 DIAGNOSIS — T8743 Infection of amputation stump, right lower extremity: Secondary | ICD-10-CM | POA: Diagnosis not present

## 2020-07-08 ENCOUNTER — Other Ambulatory Visit: Payer: Self-pay | Admitting: Family Medicine

## 2020-07-08 DIAGNOSIS — Z794 Long term (current) use of insulin: Secondary | ICD-10-CM

## 2020-07-08 DIAGNOSIS — E1165 Type 2 diabetes mellitus with hyperglycemia: Secondary | ICD-10-CM

## 2020-07-08 MED FILL — BASAGLAR 100 UNIT/ML KWIKPE: 100 | 45 days supply | Qty: 9 | Fill #1

## 2020-07-09 MED FILL — GABAPENTIN 300 MG CAPSULE: 300 | 30 days supply | Qty: 90 | Fill #0

## 2020-07-10 DIAGNOSIS — J449 Chronic obstructive pulmonary disease, unspecified: Secondary | ICD-10-CM | POA: Diagnosis not present

## 2020-07-10 DIAGNOSIS — I10 Essential (primary) hypertension: Secondary | ICD-10-CM | POA: Diagnosis not present

## 2020-07-10 DIAGNOSIS — T8789 Other complications of amputation stump: Secondary | ICD-10-CM | POA: Diagnosis not present

## 2020-07-10 DIAGNOSIS — T8743 Infection of amputation stump, right lower extremity: Secondary | ICD-10-CM | POA: Diagnosis not present

## 2020-07-10 DIAGNOSIS — E1151 Type 2 diabetes mellitus with diabetic peripheral angiopathy without gangrene: Secondary | ICD-10-CM | POA: Diagnosis not present

## 2020-07-12 ENCOUNTER — Ambulatory Visit: Payer: 59 | Admitting: Family Medicine

## 2020-07-12 DIAGNOSIS — T8789 Other complications of amputation stump: Secondary | ICD-10-CM | POA: Diagnosis not present

## 2020-07-12 DIAGNOSIS — J449 Chronic obstructive pulmonary disease, unspecified: Secondary | ICD-10-CM | POA: Diagnosis not present

## 2020-07-12 DIAGNOSIS — E1151 Type 2 diabetes mellitus with diabetic peripheral angiopathy without gangrene: Secondary | ICD-10-CM | POA: Diagnosis not present

## 2020-07-12 DIAGNOSIS — I10 Essential (primary) hypertension: Secondary | ICD-10-CM | POA: Diagnosis not present

## 2020-07-12 DIAGNOSIS — T8743 Infection of amputation stump, right lower extremity: Secondary | ICD-10-CM | POA: Diagnosis not present

## 2020-07-21 NOTE — Progress Notes (Deleted)
Subjective:   Patient ID: Christopher Fernandez    DOB: April 25, 1971, 49 y.o. male   MRN: [id number]  Christopher Fernandez is a 49 y.o. male with a history of asthma, exocrine pancreatitic insufficiency, GERD, HLD, uncontrolled DM, alcohol use disorder, h/o amputations 2/2 uncontrolled DM, anemia, anxiety/depression, asplenia, h/o MRSA infection, tobacco use disorder here for diabetes follow up  ***update meds  Diabetes: Last three A1C's below. Currently on Jardiance 25mg  QD, Basaglar 20U qAM, Metformin 1000mg  BID, and Januiva 100mg  QD. Endorses compliance. Notes CBGs range ***. Denies any hypoglycemia. Denies any polyuria, polydipsia, polyphagia. Due for ***.  Lab Results  Component Value Date   HGBA1C 8.2 (A) 03/12/2020   HGBA1C 8.4 (H) 01/10/2020   HGBA1C 8.7 (A) 12/12/2019    HTN:    today. Currently on ***/Not currently on any antihypertensives. Endorses compliance. Non-smoker/Current everyday smoker***. Denies any chest pain, SOB, vision changes, or headaches.    HLD: Last lipid panel below. Currently on Lipitor 40mg  QD. Endorses compliance. Denies any muscles aches or weakness. The 10-year ASCVD risk score Mikey Bussing DC Jr., et al., 2013) is: 19.8%   Lab Results  Component Value Date   CHOL 136 12/12/2019   HDL 39 (L) 12/12/2019   LDLCALC 71 12/12/2019   LDLDIRECT 55 02/24/2018   TRIG 149 12/12/2019   CHOLHDL 3.5 12/12/2019   Health Maintenance: Health Maintenance Due  Topic  . OPHTHALMOLOGY EXAM   . COLONOSCOPY   . INFLUENZA VACCINE    Tobacco use:  Smoked *** pack/day x *** years (*** pack year history). Does ***not qualify for lung cancer screen at this time. Patient was counseled on the risks of tobacco use and cessation strongly encouraged.  The patient was counseled on tobacco cessation today for *** minutes.  Counseling included reviewing the risks of smoking tobacco products, how it impacts the patient's current medical diagnoses and different strategies for quitting.   Pharmacotherapy to aid in tobacco cessation was not prescribed today.  Recommendations; Annual lung cancer screening with low-dose CT to adults aged 16 to 67 years, with a 20 pack-year smoking history, and who currently smoke or have quit within the last 15 years (B recommendation). Screening should be discontinued once a person has not smoked for 15 years, develops a substantially limited life-expectancy, or no longer desires to continue screening.  Review of Systems:  Per HPI.   Objective:   There were no vitals taken for this visit. Vitals and nursing note reviewed.  General: pleasant ***, sitting comfortably in exam chair, well nourished, well developed, in no acute distress with non-toxic appearance HEENT: normocephalic, atraumatic, moist mucous membranes, oropharynx clear without erythema or exudate, TM normal bilaterally  Neck: supple, non-tender without lymphadenopathy CV: regular rate and rhythm without murmurs, rubs, or gallops, no lower extremity edema, 2+ radial and pedal pulses bilaterally Lungs: clear to auscultation bilaterally with normal work of breathing on room air Resp: breathing comfortably on room air, speaking in full sentences Abdomen: soft, non-tender, non-distended, no masses or organomegaly palpable, normoactive bowel sounds Skin: warm, dry, no rashes or lesions Extremities: warm and well perfused, normal tone MSK: ROM grossly intact, strength intact, gait normal Neuro: Alert and oriented, speech normal  Assessment & Plan:   No problem-specific Assessment & Plan notes found for this encounter.  No orders of the defined types were placed in this encounter.  No orders of the defined types were placed in this encounter.  Health Maintenance: - due for colon cancer screening -  due for diabetic eye exam - ** - flu vaccine  Anemia: Last Hgb 10.5. Slowly downtrending over time. Never had colon cancer screen. Denies acute bleed.   Mina Marble, DO PGY-3,  [facility] 07/21/2020 12:28 PM

## 2020-07-22 ENCOUNTER — Ambulatory Visit: Payer: 59 | Admitting: Family Medicine

## 2020-07-31 ENCOUNTER — Ambulatory Visit: Payer: 59 | Admitting: Family Medicine

## 2020-08-05 ENCOUNTER — Other Ambulatory Visit: Payer: Self-pay | Admitting: Family Medicine

## 2020-08-05 DIAGNOSIS — F418 Other specified anxiety disorders: Secondary | ICD-10-CM

## 2020-08-05 MED FILL — GABAPENTIN 300 MG CAPSULE: 300 | 30 days supply | Qty: 90 | Fill #1

## 2020-08-05 MED FILL — JARDIANCE 25 MG TABLET: 25 | 90 days supply | Qty: 90 | Fill #3

## 2020-08-05 MED FILL — HUMALOG 100 UNITS/ML KWIKPE: 100 | 30 days supply | Qty: 9 | Fill #0

## 2020-08-05 MED FILL — DULoxetine HCL 60 MG CPEP: 60 | 90 days supply | Qty: 90 | Fill #0

## 2020-08-05 MED FILL — MONTELUKAST SOD 10 MG TAB: 10 | 90 days supply | Qty: 90 | Fill #0

## 2020-08-05 MED FILL — ARIPiprazole 5 MG TABS: 5 | 90 days supply | Qty: 90 | Fill #0

## 2020-08-05 MED FILL — JANUVIA 100 MG TABLET: 100 | 90 days supply | Qty: 90 | Fill #1

## 2020-08-05 MED FILL — DEXCOM G6 SENSOR MISC: 90 days supply | Qty: 9 | Fill #3

## 2020-08-05 MED FILL — ATORVASTATIN 40 MG TABLET: 40 | 90 days supply | Qty: 90 | Fill #2

## 2020-08-13 ENCOUNTER — Encounter: Payer: Self-pay | Admitting: Vascular Surgery

## 2020-08-13 ENCOUNTER — Ambulatory Visit (INDEPENDENT_AMBULATORY_CARE_PROVIDER_SITE_OTHER): Payer: 59 | Admitting: Vascular Surgery

## 2020-08-13 ENCOUNTER — Other Ambulatory Visit: Payer: Self-pay

## 2020-08-13 VITALS — BP 123/84 | HR 87 | Temp 98.0°F | Resp 16 | Ht 75.0 in | Wt 255.0 lb

## 2020-08-13 DIAGNOSIS — Z89431 Acquired absence of right foot: Secondary | ICD-10-CM | POA: Diagnosis not present

## 2020-08-13 NOTE — Progress Notes (Signed)
Patient name: Christopher Fernandez MRN: [id number] DOB: 04-22-1971 Sex: male  REASON FOR VISIT: follow-up for wound check of right TMA  HPI: Christopher Fernandez is a 49 y.o. male with history of diabetes and HTN that presents for ongoing wound check of his right foot.  He had a nonhealing right transmetatarsal amputation performed by orthopedic surgery and I saw him for a second opinion.  I subsequently took him to the operating room and did sharp excisional debridement of the TMA amputation with VAC placement and also got him on IV antibiotics for osteomyelitis which he has since completed.[name]   He has been in a wet-to-dry dressing after vac dressing before this.  The TMA is completely healed.  Still wearing the Darco shoe but no further dressing care to the TMA.    Past Medical History:  Diagnosis Date  . Alcoholism ([place])   . Anemia   . Anxiety   . Arthritis   . Asthma   . COPD (chronic obstructive pulmonary disease) ([place])   . Depression   . Diabetes ([place])    type 2  . GERD (gastroesophageal reflux disease)   . History of hiatal hernia   . HTN (hypertension)   . Osteomyelitis ([place])    right forefoot  . Pancreatitis   . Pneumonia   . Wears glasses     Past Surgical History:  Procedure Laterality Date  . 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 AMPUTATION Right 10/18/2019   Procedure: RIGHT SECOND TOE AMPUTATION;  Surgeon: Newt Minion, MD;  Location: [facility];  Service: Orthopedics;  Laterality: Right;  . AMPUTATION Right 11/08/2019   Procedure: RIGHT TRANSMETATARSAL AMPUTATION;  Surgeon: Newt Minion, MD;  Location: [facility];  Service: Orthopedics;  Laterality: Right;  . APPENDECTOMY    . APPLICATION OF WOUND VAC Right 01/10/2020   Procedure: APPLICATION OF WOUND VAC, right foot;  Surgeon: [name], MD;  Location: [facility];  Service: Vascular;  Laterality: Right;  . CHOLECYSTECTOMY  2006   with gallbladder and spleen  . COLONOSCOPY  8-10 years ago    in  [place] exam per pt  . HERNIA REPAIR  2008, 2010   hiatal hernia and 1 additional  . NASAL SEPTUM SURGERY    . PANCREATIC PSEUDOCYST DRAINAGE    . SPLENECTOMY  2006  . STUMP REVISION Right 11/24/2019   Procedure: REVISION RIGHT TRANSMETATARSAL AMPUTATION;  Surgeon: Newt Minion, MD;  Location: [name];  Service: Orthopedics;  Laterality: Right;  . TOTAL KNEE ARTHROPLASTY Right 1982, 1984  . WOUND DEBRIDEMENT Right 01/10/2020   Procedure: incisional DEBRIDEMENT of RIGHT TRANSMETATARSAL WOUND;  Surgeon: [name], MD;  Location[facility];  Service: Vascular;  Laterality: Right;    Family History  Problem Relation Age of Onset  . Diabetes Mother   . Hypertension Mother   . Hyperlipidemia Mother   . Kidney disease Mother   . Thyroid disease Mother   . Breast cancer Mother        mets  . Lung cancer Mother   . Diabetes Father   . Alcohol abuse Sister   . Sickle cell trait Sister   . Diabetes Brother   . Asthma Brother   . Lung cancer Maternal Grandmother   . Kidney disease Maternal Grandmother   . Liver cancer Maternal Uncle        x 4-5  . Kidney disease Maternal Uncle   . Kidney disease Paternal Uncle   .  Colon cancer Neg Hx   . Colon polyps Neg Hx   . Esophageal cancer Neg Hx   . Rectal cancer Neg Hx   . Stomach cancer Neg Hx     SOCIAL HISTORY: Social History   Tobacco Use  . Smoking status: Current Every Day Smoker    Packs/day: 0.50    Types: Cigarettes  . Smokeless tobacco: Never Used  Substance Use Topics  . Alcohol use: Not Currently    Comment: 2-3 per day, none since 2019    Allergies  Allergen Reactions  . Eggs Or Egg-Derived Products Rash  . Morphine And Related Other (See Comments)    Cant take because of pancreatitis  . Cocoa Rash    Current Outpatient Medications  Medication Sig Dispense Refill  . albuterol (VENTOLIN HFA) 108 (90 Base) MCG/ACT inhaler Inhale 2-4 puffs into the lungs every 4 (four) hours as needed for wheezing  (or cough). (Patient taking differently: Inhale 2 puffs into the lungs every 4 (four) hours as needed for wheezing (or cough). ) 6.7 g 11  . ARIPiprazole (ABILIFY) 5 MG tablet TAKE 1 TABLET (5 MG TOTAL) BY MOUTH DAILY. 90 tablet 0  . atorvastatin (LIPITOR) 40 MG tablet Take 1 tablet (40 mg total) by mouth daily. 90 tablet 3  . bismuth subsalicylate (PEPTO BISMOL) 262 MG/15ML suspension Take 30 mLs by mouth every 6 (six) hours as needed for indigestion or diarrhea or loose stools.    . Continuous Blood Gluc Receiver (DEXCOM G6 RECEIVER) DEVI 1 Device by Does not apply route 4 (four) times daily. 1 each 11  . Continuous Blood Gluc Sensor (DEXCOM G6 SENSOR) MISC 1 Device by Does not apply route 4 (four) times daily. 3 each 11  . Continuous Blood Gluc Transmit (DEXCOM G6 TRANSMITTER) MISC 1 Device by Does not apply route 4 (four) times daily. 1 each 11  . diphenhydrAMINE (BENADRYL) 25 MG tablet Take 25 mg by mouth every 6 (six) hours as needed for itching or allergies.     . DULoxetine (CYMBALTA) 60 MG capsule TAKE 1 CAPSULE (60 MG TOTAL) BY MOUTH DAILY. 90 capsule 0  . EASY TOUCH PEN NEEDLES 31G X 5 MM MISC Inject 1 application as directed 3 (three) times daily. 100 each 11  . empagliflozin (JARDIANCE) 25 MG TABS tablet Take 25 mg by mouth daily. 90 tablet 3  . fluticasone (FLONASE) 50 MCG/ACT nasal spray Place 1 spray into both nostrils daily as needed for allergies or rhinitis.    . Fluticasone-Salmeterol (ADVAIR DISKUS) 250-50 MCG/DOSE AEPB Inhale 1 puff into the lungs 2 (two) times daily. 180 each 3  . FREESTYLE LITE test strip USE AS INSTRUCTED 100 strip 12  . gabapentin (NEURONTIN) 300 MG capsule TAKE 1 CAPSULE (300 MG TOTAL) BY MOUTH 3 (THREE) TIMES DAILY. 90 capsule 2  . glucose blood test strip USE AS INSTRUCTED    . HUMALOG KWIKPEN 100 UNIT/ML KwikPen INJECT 0-10 UNITS TOTAL INTO THE SKIN 3 TIMES DAILY WITH MEALS. 9 mL 0  . ibuprofen (ADVIL) 200 MG tablet Take 800 mg by mouth every 8 (eight)  hours as needed for headache or moderate pain.     . Insulin Glargine (BASAGLAR KWIKPEN) 100 UNIT/ML INJECT 0.2 MLS (20 UNITS TOTAL) INTO THE SKIN IN THE MORNING. 9 mL 2  . Lancets (FREESTYLE) lancets Use as instructed 100 each 12  . Melatonin 10 MG CAPS Take 30 mg by mouth at bedtime as needed (sleep).     . metFORMIN (  GLUCOPHAGE) 1000 MG tablet TAKE 1 TABLET (1,000 MG TOTAL) BY MOUTH 2 (TWO) TIMES DAILY WITH A MEAL. 180 tablet 3  . metroNIDAZOLE (FLAGYL) 500 MG tablet Take 1 tablet (500 mg total) by mouth every 8 (eight) hours. 126 tablet 0  . mirtazapine (REMERON) 30 MG tablet Take 1 tablet (30 mg total) by mouth at bedtime. 90 tablet 3  . montelukast (SINGULAIR) 10 MG tablet TAKE 1 TABLET (10 MG TOTAL) BY MOUTH AT BEDTIME. 90 tablet 0  . naltrexone (DEPADE) 50 MG tablet Take 1 tablet (50 mg total) by mouth daily. 30 tablet 0  . nitroGLYCERIN (NITRODUR - DOSED IN MG/24 HR) 0.2 mg/hr patch Place 1 patch (0.2 mg total) onto the skin daily. (Patient taking differently: Place 0.1 mg onto the skin daily. ) 30 patch 12  . pentoxifylline (TRENTAL) 400 MG CR tablet Take 1 tablet (400 mg total) by mouth 3 (three) times daily with meals. 90 tablet 3  . sitaGLIPtin (JANUVIA) 100 MG tablet Take 1 tablet (100 mg total) by mouth daily. 30 tablet 10  . Valerian Root 450 MG CAPS Take 900 tablets by mouth at bedtime.     . cephALEXin (KEFLEX) 500 MG capsule Take 1 capsule (500 mg total) by mouth 2 (two) times daily. (Patient not taking: Reported on 06/11/2020) 14 capsule 0  . silver sulfADIAZINE (SILVADENE) 1 % cream Apply 1 application topically daily. Apply to affected area daily plus dry dressing (Patient not taking: Reported on 06/11/2020) 400 g 3   No current facility-administered medications for this visit.    REVIEW OF SYSTEMS:  [X]  denotes positive finding, [ ]  denotes negative finding Cardiac  Comments:  Chest pain or chest pressure:    Shortness of breath upon exertion:    Short of breath when  lying flat:    Irregular heart rhythm:        Vascular    Pain in calf, thigh, or hip brought on by ambulation:    Pain in feet at night that wakes you up from your sleep:     Blood clot in your veins:    Leg swelling:         Pulmonary    Oxygen at home:    Productive cough:     Wheezing:         Neurologic    Sudden weakness in arms or legs:     Sudden numbness in arms or legs:     Sudden onset of difficulty speaking or slurred speech:    Temporary loss of vision in one eye:     Problems with dizziness:         Gastrointestinal    Blood in stool:     Vomited blood:         Genitourinary    Burning when urinating:     Blood in urine:        Psychiatric    Major depression:         Hematologic    Bleeding problems:    Problems with blood clotting too easily:        Skin    Rashes or ulcers:        Constitutional    Fever or chills:      PHYSICAL EXAM: Vitals:   08/13/20 1337  BP: 123/84  Pulse: 87  Resp: 16  Temp: 98 F (36.7 C)  TempSrc: Temporal  SpO2: 98%  Weight: 255 lb (115.7 kg)  Height: 6\' 3"  (1.905 m)  GENERAL: The patient is a well-nourished male, in no acute distress. The vital signs are documented above. CARDIAC: There is a regular rate and rhythm.  VASCULAR:  Right DP and PT palpable Right TMA now healed  DATA:   None  Assessment/Plan:  49 year old male status post right TMA debridement for nonhealing TMA that was initially performed by orthopedic surgery.  He previously completed his IV antibiotics for osteomyelitis.  The wound is now completely healed and I am very happy with his progress.  Discussed he can follow-up with me as needed moving foward.  He has palpable pedal pulses in the right foot, so I have no other concerns from a vascular standpoint.  I did send a prescription to [employer] to fit him for a special shoe for his right TMA at his request.   [name], MD [facility] Office: [phone]

## 2020-08-19 ENCOUNTER — Ambulatory Visit (INDEPENDENT_AMBULATORY_CARE_PROVIDER_SITE_OTHER): Payer: 59 | Admitting: Family Medicine

## 2020-08-19 ENCOUNTER — Other Ambulatory Visit: Payer: Self-pay | Admitting: Family Medicine

## 2020-08-19 ENCOUNTER — Other Ambulatory Visit: Payer: Self-pay

## 2020-08-19 ENCOUNTER — Encounter: Payer: Self-pay | Admitting: Family Medicine

## 2020-08-19 VITALS — BP 122/80 | HR 88 | Ht 75.0 in | Wt 255.0 lb

## 2020-08-19 DIAGNOSIS — Z794 Long term (current) use of insulin: Secondary | ICD-10-CM

## 2020-08-19 DIAGNOSIS — G252 Other specified forms of tremor: Secondary | ICD-10-CM | POA: Diagnosis not present

## 2020-08-19 DIAGNOSIS — F17200 Nicotine dependence, unspecified, uncomplicated: Secondary | ICD-10-CM

## 2020-08-19 DIAGNOSIS — Z23 Encounter for immunization: Secondary | ICD-10-CM

## 2020-08-19 DIAGNOSIS — E1165 Type 2 diabetes mellitus with hyperglycemia: Secondary | ICD-10-CM | POA: Diagnosis not present

## 2020-08-19 DIAGNOSIS — F1020 Alcohol dependence, uncomplicated: Secondary | ICD-10-CM

## 2020-08-19 LAB — POCT GLYCOSYLATED HEMOGLOBIN (HGB A1C): Hemoglobin A1C: 7.9 % — AB (ref 4.0–5.6)

## 2020-08-19 MED ORDER — PROPRANOLOL HCL ER 80 MG PO CP24: 80.0000 mg | ORAL_CAPSULE | Freq: Every day | ORAL | 1 refills | Status: DC

## 2020-08-19 MED FILL — PROPRANOLOL HCL ER 80 MG CP: 80 | 30 days supply | Qty: 30 | Fill #0

## 2020-08-19 NOTE — Assessment & Plan Note (Addendum)
Chronic x 15 years, worsening. Action tremor that appears to occur with specific fine motor actions, not appreciated with normal ROM or at rest. Significantly affecting patient's ability to write or brush his teeth. Too pronounced for physiologic tremor. 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 History of alcoholism and antipsychotic use may be contributing although do not suspect primary contributors. No signs of cerebellar dysfunction. No significant change in symptoms with change in antipsychotic. No resting tremor or pill rolling action to indicate parkinsonian trait. Do not feel imaging is indicated at this time. - Trial long acting Propranolol 80mg  QD  - follow up 1 month to evaluate for improvement

## 2020-08-19 NOTE — Assessment & Plan Note (Signed)
Smoking 0.5 PPD x 25 years, used to smoke 1-2 PPD x 10-12 years. Pack Years ~25. Not interested in quitting. Patient does qualify for lung cancer screen at this time. Patient was counseled on the risks of tobacco use and cessation strongly encouraged. - unable to schedule low dose CT as [place] imaging centers require patient be 50 yo although new guidelines indicate screening starting at 51 years old. Will hold off on imaging at this time and order when able.

## 2020-08-19 NOTE — Patient Instructions (Signed)
It was a pleasure to see you today!  Thank you for choosing [facility] for your primary care.  Christopher Fernandez was seen for diabetes and tremor.   Our plans for today were:  Your A1C has improved to 7.9! Congratulations! Continue current medications and follow up in 3 months  For your tremor: Please start Propranolol 80mg  daily in the morning. Follow up in 1 month to see how things are going.  Congratulations for quitting alcohol. Please keep up the good work. I will research on that alternative medicine and let you know if it will be okay to start  At this time we can not get your CT scan scheduled. We will plan to do it at age 49 or sooner if insurance will approve.  To keep you healthy, please keep in mind the following health maintenance items that you are due for:   1. Please schedule colonoscopy 2. Please have your diabetic eye exam results     You should return to our clinic in 1 month for tremor follow up.   Best Wishes,   Mina Marble, DO

## 2020-08-19 NOTE — Progress Notes (Signed)
Subjective:   Patient ID: [name]    DOB: 1971/04/07, 49 y.o. male   MRN: [id number]  Christopher Fernandez is a 49 y.o. 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 Currently on Jardiance 25mg  QD, metformin 1000mg  BID, Januvia 100mg  QD, Basaglar 20U qAM, and Humalog sliding scale (2-4 units) TID. Endorses compliance. Notes CBGs range 135-220. Notes occasionally CBG of 70's. Average fasting BS: 170.  Denies any polyuria, polydipsia, polyphagia. Due for diabetic eye exam.   Lab Results  Component Value Date   HGBA1C 7.9 (A) 08/19/2020   HGBA1C 8.2 (A) 03/12/2020   HGBA1C 8.4 (H) 01/10/2020    Hand Weakness/Tremors: Patient notes that he feels like he's lost control over his hand to do simple tasks when brushing his teeth or writing or brushing his teeth. He notes this has been progressive over the last 15 years. He notes more fine control tends to bring out the tremor. He feels like his arm is fatigued as well. Improves with alcohol use, which he has struggled with. He notes he quite drinking alcohol about 10 months ago.  Medications: He has been taking Abilify, Cymbalta, and Mirtazepine for 2 years about now. He was on Trazodone, Seroquel, Ambien and Celexa for years. Denies any worsening when starting taking these medications.   He notes that he had increased anxiety lately but this doesn't seem to be worsened as the tremor has been here for years. The severity of the tremor does seem to worsen at times making it very hard to write.  Caffeine: Drinks 3-4 cups of coffee a day. He has been doing this for 5-7 years.  Denies resting tremor. Denies improvement with distraction.   Health Maintenance: Health Maintenance  Due  Topic  . OPHTHALMOLOGY EXAM   . COLONOSCOPY    Review of Systems:  Per HPI.   Objective:   BP 122/80   Pulse 88   Ht 6\' 3"  (1.905 m)   Wt 255 lb (115.7 kg)   SpO2 99%   BMI 31.87 kg/m  Vitals and nursing note reviewed.  General: pleasant older male, sitting comfortably in exam chair, well nourished, well developed, in no acute distress with non-toxic appearance Resp: breathing comfortably on room air, speaking in full sentences MSK: walking boot in place on right foot in setting of prior toe amputation Neuro: Alert and oriented, speech normal, Finger to nose and Heel to shin normal. No resting tremor. Mild physiologic tremor when hands extended out. Significant and more pronounced tremor of right hand when performing action such as writing (increased frequency and amplitude)   Assessment & Plan:   Uncontrolled diabetes mellitus with hyperglycemia, with long-term current use of insulin (HCC) Chronic, improving. A1C improved from 8.2 to  7.9. Continue Jardiacne 25mg  QD, metformin 1000mg  BID, Januvia 100mg  QD, Basaglar 20U qAM, and Humalog sliding scale (2-4 units) TID diabetic eye exam completed about 6 weeks ago. Patient to have results sent to [facility]  Tobacco use disorder Smoking 0.5 PPD x 25 years, used to smoke 1-2 PPD x 10-12 years. Pack Years ~25. Not interested in quitting. Patient does qualify for lung cancer screen at this time. Patient was counseled on the risks of tobacco use and cessation strongly encouraged. - unable to schedule low dose CT as [facility]  imaging centers require patient be 49 yo although new guidelines indicate screening starting at 51 years old. Will hold off on imaging at this time and order when able.  Alcohol use disorder, severe, dependence ([place]) Currently abstinent x 10 months. No improvement with urges with Naltrexone. Interested in alternative method. Discussed trial of Acamprosate but given history of depression/SI, opted to avoid this  medication. Can further discuss alternative option pending patient's interest or needs moving forward.  Action tremor Chronic x 15 years, worsening. Action tremor that appears to occur with specific fine motor actions, not appreciated with normal ROM or at rest. Significantly affecting patient's ability to write or brush his teeth. Too pronounced for physiologic tremor. 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 History of alcoholism and antipsychotic use may be contributing although do not suspect primary contributors. No signs of cerebellar dysfunction. No significant change in symptoms with change in antipsychotic. No resting tremor or pill rolling action to indicate parkinsonian trait. Do not feel imaging is indicated at this time. - Trial long acting Propranolol 80mg  QD  - follow up 1 month to evaluate for improvement   Health Maintenance: - patient to call to schedule colonoscopy  - diabetic eye exam completed about 6 weeks ago. Patient to have results sent to [facility]  Orders Placed This Encounter  Procedures  . Flu Vaccine QUAD 36+ mos IM  . TSH  . Hepatic Function Panel  . POCT glycosylated hemoglobin (Hb A1C)   Meds ordered this encounter  Medications  . propranolol ER (INDERAL LA) 80 MG 24 hr capsule    Sig: Take 1 capsule (80 mg total) by mouth daily.    Dispense:  30 capsule    Refill:  1  . DISCONTD: acamprosate (CAMPRAL) 333 MG tablet    Sig: Take 2 tablets (666 mg total) by mouth 3 (three) times daily with meals.    Dispense:  90 tablet    Refill:  [place], DO PGY-3, [facility] 08/20/2020 6:35 PM

## 2020-08-19 NOTE — Assessment & Plan Note (Signed)
Chronic, improving. A1C improved from 8.2 to  7.9. Continue Jardiacne 25mg  QD, metformin 1000mg  BID, Januvia 100mg  QD, Basaglar 20U qAM, and Humalog sliding scale (2-4 units) TID diabetic eye exam completed about 6 weeks ago. Patient to have results sent to [facility]

## 2020-08-19 NOTE — Assessment & Plan Note (Addendum)
Currently abstinent x 10 months. No improvement with urges with Naltrexone. Interested in alternative method. Discussed trial of Acamprosate but given history of depression/SI, opted to avoid this medication. Can further discuss alternative option pending patient's interest or needs moving forward.

## 2020-08-20 LAB — TSH: TSH: 1.43 u[IU]/mL (ref 0.450–4.500)

## 2020-08-20 LAB — HEPATIC FUNCTION PANEL
ALT: 58 IU/L — ABNORMAL HIGH (ref 0–44)
AST: 40 IU/L (ref 0–40)
Albumin: 4.9 g/dL (ref 3.8–4.9)
Alkaline Phosphatase: 86 IU/L (ref 44–121)
Bilirubin Total: 0.4 mg/dL (ref 0.0–1.2)
Bilirubin, Direct: 0.14 mg/dL (ref 0.00–0.40)
Total Protein: 7.3 g/dL (ref 6.0–8.5)

## 2020-08-20 MED ORDER — ACAMPROSATE CALCIUM 333 MG PO TBEC: 666.0000 mg | DELAYED_RELEASE_TABLET | Freq: Three times a day (TID) | ORAL | 1 refills | Status: DC

## 2020-08-21 MED FILL — ACAMPROSATE CALC DR 333 MG: 333 | 15 days supply | Qty: 90 | Fill #0

## 2020-08-26 MED FILL — ALBUTEROL SULFATE HFA 108 (: 108 (90 BAS | 8 days supply | Qty: 18 | Fill #3

## 2020-08-28 MED FILL — MIRTAZAPINE 30 MG TABLET: 30 | 90 days supply | Qty: 90 | Fill #3

## 2020-08-29 MED FILL — GABAPENTIN 300 MG CAPSULE: 300 | 30 days supply | Qty: 90 | Fill #2

## 2020-09-03 ENCOUNTER — Encounter: Payer: Self-pay | Admitting: Family Medicine

## 2020-09-03 NOTE — Telephone Encounter (Signed)
Called patient to discuss myChart message. Patient reports increased swelling and pain in left foot and leg. Patient denies current fever and redness but does report warmness to the area.   Scheduled patient with Dr. Tarry Kos tomorrow morning.   Strict ED precautions given.   To PCP  Talbot Grumbling, RN

## 2020-09-04 ENCOUNTER — Ambulatory Visit (INDEPENDENT_AMBULATORY_CARE_PROVIDER_SITE_OTHER): Payer: 59 | Admitting: Family Medicine

## 2020-09-04 ENCOUNTER — Emergency Department (HOSPITAL_COMMUNITY): Payer: 59

## 2020-09-04 ENCOUNTER — Encounter (HOSPITAL_COMMUNITY): Payer: Self-pay | Admitting: Emergency Medicine

## 2020-09-04 ENCOUNTER — Encounter: Payer: Self-pay | Admitting: Family Medicine

## 2020-09-04 ENCOUNTER — Inpatient Hospital Stay (HOSPITAL_COMMUNITY)
Admission: EM | Admit: 2020-09-04 | Discharge: 2020-09-07 | DRG: 617 | Disposition: A | Discharge status: Home / Self Care | Payer: 59 | Attending: Family Medicine | Admitting: Family Medicine

## 2020-09-04 ENCOUNTER — Other Ambulatory Visit: Payer: Self-pay

## 2020-09-04 VITALS — BP 128/78 | HR 77 | Temp 98.3°F | Ht 75.0 in | Wt 264.6 lb

## 2020-09-04 DIAGNOSIS — Z803 Family history of malignant neoplasm of breast: Secondary | ICD-10-CM

## 2020-09-04 DIAGNOSIS — Z794 Long term (current) use of insulin: Secondary | ICD-10-CM

## 2020-09-04 DIAGNOSIS — Z7951 Long term (current) use of inhaled steroids: Secondary | ICD-10-CM

## 2020-09-04 DIAGNOSIS — L97529 Non-pressure chronic ulcer of other part of left foot with unspecified severity: Secondary | ICD-10-CM | POA: Diagnosis not present

## 2020-09-04 DIAGNOSIS — Z7984 Long term (current) use of oral hypoglycemic drugs: Secondary | ICD-10-CM

## 2020-09-04 DIAGNOSIS — E1152 Type 2 diabetes mellitus with diabetic peripheral angiopathy with gangrene: Secondary | ICD-10-CM | POA: Diagnosis not present

## 2020-09-04 DIAGNOSIS — K219 Gastro-esophageal reflux disease without esophagitis: Secondary | ICD-10-CM | POA: Diagnosis present

## 2020-09-04 DIAGNOSIS — E1140 Type 2 diabetes mellitus with diabetic neuropathy, unspecified: Secondary | ICD-10-CM | POA: Diagnosis present

## 2020-09-04 DIAGNOSIS — L02612 Cutaneous abscess of left foot: Secondary | ICD-10-CM | POA: Diagnosis not present

## 2020-09-04 DIAGNOSIS — E1169 Type 2 diabetes mellitus with other specified complication: Secondary | ICD-10-CM | POA: Diagnosis not present

## 2020-09-04 DIAGNOSIS — Z888 Allergy status to other drugs, medicaments and biological substances status: Secondary | ICD-10-CM

## 2020-09-04 DIAGNOSIS — Z832 Family history of diseases of the blood and blood-forming organs and certain disorders involving the immune mechanism: Secondary | ICD-10-CM

## 2020-09-04 DIAGNOSIS — I10 Essential (primary) hypertension: Secondary | ICD-10-CM | POA: Diagnosis present

## 2020-09-04 DIAGNOSIS — Z801 Family history of malignant neoplasm of trachea, bronchus and lung: Secondary | ICD-10-CM

## 2020-09-04 DIAGNOSIS — Z872 Personal history of diseases of the skin and subcutaneous tissue: Secondary | ICD-10-CM | POA: Diagnosis not present

## 2020-09-04 DIAGNOSIS — Z96651 Presence of right artificial knee joint: Secondary | ICD-10-CM | POA: Diagnosis present

## 2020-09-04 DIAGNOSIS — F17210 Nicotine dependence, cigarettes, uncomplicated: Secondary | ICD-10-CM | POA: Diagnosis present

## 2020-09-04 DIAGNOSIS — Z83438 Family history of other disorder of lipoprotein metabolism and other lipidemia: Secondary | ICD-10-CM

## 2020-09-04 DIAGNOSIS — E11628 Type 2 diabetes mellitus with other skin complications: Secondary | ICD-10-CM | POA: Diagnosis not present

## 2020-09-04 DIAGNOSIS — Z811 Family history of alcohol abuse and dependence: Secondary | ICD-10-CM

## 2020-09-04 DIAGNOSIS — Z9049 Acquired absence of other specified parts of digestive tract: Secondary | ICD-10-CM

## 2020-09-04 DIAGNOSIS — E785 Hyperlipidemia, unspecified: Secondary | ICD-10-CM | POA: Diagnosis present

## 2020-09-04 DIAGNOSIS — D638 Anemia in other chronic diseases classified elsewhere: Secondary | ICD-10-CM | POA: Diagnosis present

## 2020-09-04 DIAGNOSIS — F1020 Alcohol dependence, uncomplicated: Secondary | ICD-10-CM | POA: Diagnosis present

## 2020-09-04 DIAGNOSIS — Z66 Do not resuscitate: Secondary | ICD-10-CM | POA: Diagnosis present

## 2020-09-04 DIAGNOSIS — Z800 Family history of malignant neoplasm of digestive organs: Secondary | ICD-10-CM

## 2020-09-04 DIAGNOSIS — Z79899 Other long term (current) drug therapy: Secondary | ICD-10-CM

## 2020-09-04 DIAGNOSIS — F418 Other specified anxiety disorders: Secondary | ICD-10-CM | POA: Diagnosis present

## 2020-09-04 DIAGNOSIS — K863 Pseudocyst of pancreas: Secondary | ICD-10-CM | POA: Diagnosis present

## 2020-09-04 DIAGNOSIS — M869 Osteomyelitis, unspecified: Secondary | ICD-10-CM | POA: Diagnosis present

## 2020-09-04 DIAGNOSIS — L0390 Cellulitis, unspecified: Secondary | ICD-10-CM | POA: Diagnosis not present

## 2020-09-04 DIAGNOSIS — Z8614 Personal history of Methicillin resistant Staphylococcus aureus infection: Secondary | ICD-10-CM | POA: Diagnosis not present

## 2020-09-04 DIAGNOSIS — R251 Tremor, unspecified: Secondary | ICD-10-CM | POA: Diagnosis present

## 2020-09-04 DIAGNOSIS — Z9081 Acquired absence of spleen: Secondary | ICD-10-CM | POA: Diagnosis not present

## 2020-09-04 DIAGNOSIS — L089 Local infection of the skin and subcutaneous tissue, unspecified: Secondary | ICD-10-CM

## 2020-09-04 DIAGNOSIS — M19072 Primary osteoarthritis, left ankle and foot: Secondary | ICD-10-CM | POA: Diagnosis not present

## 2020-09-04 DIAGNOSIS — L03116 Cellulitis of left lower limb: Secondary | ICD-10-CM | POA: Diagnosis present

## 2020-09-04 DIAGNOSIS — R6889 Other general symptoms and signs: Secondary | ICD-10-CM

## 2020-09-04 DIAGNOSIS — L97519 Non-pressure chronic ulcer of other part of right foot with unspecified severity: Secondary | ICD-10-CM | POA: Diagnosis present

## 2020-09-04 DIAGNOSIS — J4520 Mild intermittent asthma, uncomplicated: Secondary | ICD-10-CM | POA: Diagnosis present

## 2020-09-04 DIAGNOSIS — R609 Edema, unspecified: Secondary | ICD-10-CM | POA: Diagnosis not present

## 2020-09-04 DIAGNOSIS — Z833 Family history of diabetes mellitus: Secondary | ICD-10-CM

## 2020-09-04 DIAGNOSIS — L97409 Non-pressure chronic ulcer of unspecified heel and midfoot with unspecified severity: Secondary | ICD-10-CM | POA: Diagnosis present

## 2020-09-04 DIAGNOSIS — Z8249 Family history of ischemic heart disease and other diseases of the circulatory system: Secondary | ICD-10-CM

## 2020-09-04 DIAGNOSIS — Z91012 Allergy to eggs: Secondary | ICD-10-CM

## 2020-09-04 DIAGNOSIS — S91102A Unspecified open wound of left great toe without damage to nail, initial encounter: Secondary | ICD-10-CM | POA: Diagnosis not present

## 2020-09-04 DIAGNOSIS — Z841 Family history of disorders of kidney and ureter: Secondary | ICD-10-CM

## 2020-09-04 DIAGNOSIS — Z20822 Contact with and (suspected) exposure to covid-19: Secondary | ICD-10-CM | POA: Diagnosis not present

## 2020-09-04 DIAGNOSIS — Z825 Family history of asthma and other chronic lower respiratory diseases: Secondary | ICD-10-CM

## 2020-09-04 DIAGNOSIS — J449 Chronic obstructive pulmonary disease, unspecified: Secondary | ICD-10-CM | POA: Diagnosis present

## 2020-09-04 DIAGNOSIS — I96 Gangrene, not elsewhere classified: Secondary | ICD-10-CM | POA: Diagnosis not present

## 2020-09-04 DIAGNOSIS — Z89421 Acquired absence of other right toe(s): Secondary | ICD-10-CM | POA: Diagnosis not present

## 2020-09-04 DIAGNOSIS — L538 Other specified erythematous conditions: Secondary | ICD-10-CM | POA: Diagnosis not present

## 2020-09-04 DIAGNOSIS — M86172 Other acute osteomyelitis, left ankle and foot: Secondary | ICD-10-CM | POA: Diagnosis not present

## 2020-09-04 DIAGNOSIS — L03032 Cellulitis of left toe: Secondary | ICD-10-CM | POA: Diagnosis present

## 2020-09-04 DIAGNOSIS — E1165 Type 2 diabetes mellitus with hyperglycemia: Secondary | ICD-10-CM | POA: Diagnosis not present

## 2020-09-04 DIAGNOSIS — K8681 Exocrine pancreatic insufficiency: Secondary | ICD-10-CM | POA: Diagnosis present

## 2020-09-04 DIAGNOSIS — E11621 Type 2 diabetes mellitus with foot ulcer: Secondary | ICD-10-CM | POA: Diagnosis not present

## 2020-09-04 DIAGNOSIS — Z8349 Family history of other endocrine, nutritional and metabolic diseases: Secondary | ICD-10-CM

## 2020-09-04 DIAGNOSIS — Z885 Allergy status to narcotic agent status: Secondary | ICD-10-CM

## 2020-09-04 LAB — CBC WITH DIFFERENTIAL/PLATELET
Abs Immature Granulocytes: 0.02 10*3/uL (ref 0.00–0.07)
Basophils Absolute: 0 10*3/uL (ref 0.0–0.1)
Basophils Relative: 1 %
Eosinophils Absolute: 0.2 10*3/uL (ref 0.0–0.5)
Eosinophils Relative: 2 %
HCT: 42.8 % (ref 39.0–52.0)
Hemoglobin: 13.4 g/dL (ref 13.0–17.0)
Immature Granulocytes: 0 %
Lymphocytes Relative: 28 %
Lymphs Abs: 1.8 10*3/uL (ref 0.7–4.0)
MCH: 28.4 pg (ref 26.0–34.0)
MCHC: 31.3 g/dL (ref 30.0–36.0)
MCV: 90.7 fL (ref 80.0–100.0)
Monocytes Absolute: 0.7 10*3/uL (ref 0.1–1.0)
Monocytes Relative: 11 %
Neutro Abs: 3.7 10*3/uL (ref 1.7–7.7)
Neutrophils Relative %: 58 %
Platelets: 252 10*3/uL (ref 150–400)
RBC: 4.72 MIL/uL (ref 4.22–5.81)
RDW: 14.2 % (ref 11.5–15.5)
WBC: 6.4 10*3/uL (ref 4.0–10.5)
nRBC: 0 % (ref 0.0–0.2)

## 2020-09-04 LAB — URINALYSIS, ROUTINE W REFLEX MICROSCOPIC
Bacteria, UA: NONE SEEN
Bilirubin Urine: NEGATIVE
Glucose, UA: >=500 mg/dL — AB
Hgb urine dipstick: NEGATIVE
Ketones, ur: NEGATIVE mg/dL
Leukocytes,Ua: NEGATIVE
Nitrite: NEGATIVE
Protein, ur: NEGATIVE mg/dL
Specific Gravity, Urine: 1.035 — ABNORMAL HIGH (ref 1.005–1.030)
pH: 5 (ref 5.0–8.0)

## 2020-09-04 LAB — COMPREHENSIVE METABOLIC PANEL
ALT: 29 U/L (ref 0–44)
AST: 18 U/L (ref 15–41)
Albumin: 3.7 g/dL (ref 3.5–5.0)
Alkaline Phosphatase: 72 U/L (ref 38–126)
Anion gap: 11 (ref 5–15)
BUN: 11 mg/dL (ref 6–20)
CO2: 26 mmol/L (ref 22–32)
Calcium: 9.7 mg/dL (ref 8.9–10.3)
Chloride: 100 mmol/L (ref 98–111)
Creatinine, Ser: 1.01 mg/dL (ref 0.61–1.24)
GFR, Estimated: >60 mL/min (ref >60)
Glucose, Bld: 145 mg/dL — ABNORMAL HIGH (ref 70–99)
Potassium: 4.2 mmol/L (ref 3.5–5.1)
Sodium: 137 mmol/L (ref 135–145)
Total Bilirubin: 0.4 mg/dL (ref 0.3–1.2)
Total Protein: 7 g/dL (ref 6.5–8.1)

## 2020-09-04 LAB — RESP PANEL BY RT-PCR (FLU A&B, COVID) ARPGX2
Influenza A by PCR: NEGATIVE
Influenza B by PCR: NEGATIVE
SARS Coronavirus 2 by RT PCR: NEGATIVE

## 2020-09-04 LAB — LACTIC ACID, PLASMA: Lactic Acid, Venous: 1.1 mmol/L (ref 0.5–1.9)

## 2020-09-04 MED ORDER — LINAGLIPTIN 5 MG PO TABS
5.0000 mg | ORAL_TABLET | Freq: Every day | ORAL | Status: DC
  Administered 2020-09-05: 5 mg via ORAL
  Filled 2020-09-04 (×3): qty 1

## 2020-09-04 MED ORDER — ARIPIPRAZOLE 5 MG PO TABS
5.0000 mg | ORAL_TABLET | Freq: Every day | ORAL | Status: DC
  Administered 2020-09-05 – 2020-09-07 (×3): 5 mg via ORAL
  Filled 2020-09-04 (×3): qty 1

## 2020-09-04 MED ORDER — NICOTINE 21 MG/24HR TD PT24: 21.0000 mg | MEDICATED_PATCH | Freq: Every day | TRANSDERMAL | Status: DC

## 2020-09-04 MED ORDER — EMPAGLIFLOZIN 25 MG PO TABS
25.0000 mg | ORAL_TABLET | Freq: Every day | ORAL | Status: DC
  Administered 2020-09-05 – 2020-09-07 (×3): 25 mg via ORAL
  Filled 2020-09-04 (×3): qty 1

## 2020-09-04 MED ORDER — OXYCODONE-ACETAMINOPHEN 5-325 MG PO TABS
1.0000 | ORAL_TABLET | Freq: Once | ORAL | Status: AC
  Administered 2020-09-04: 1 via ORAL
  Filled 2020-09-04: qty 1

## 2020-09-04 MED ORDER — ALBUTEROL SULFATE HFA 108 (90 BASE) MCG/ACT IN AERS: 2.0000 | INHALATION_SPRAY | RESPIRATORY_TRACT | Status: DC | PRN

## 2020-09-04 MED ORDER — DULOXETINE HCL 60 MG PO CPEP
60.0000 mg | ORAL_CAPSULE | Freq: Every day | ORAL | Status: DC
  Administered 2020-09-05 – 2020-09-07 (×3): 60 mg via ORAL
  Filled 2020-09-04 (×3): qty 1

## 2020-09-04 MED ORDER — ATORVASTATIN CALCIUM 40 MG PO TABS
40.0000 mg | ORAL_TABLET | Freq: Every day | ORAL | Status: DC
  Administered 2020-09-05 – 2020-09-06 (×2): 40 mg via ORAL
  Filled 2020-09-04 (×2): qty 1

## 2020-09-04 MED ORDER — GADOBUTROL 1 MMOL/ML IV SOLN: 10.0000 mL | Freq: Once | INTRAVENOUS | Status: DC | PRN

## 2020-09-04 MED ORDER — MONTELUKAST SODIUM 10 MG PO TABS
10.0000 mg | ORAL_TABLET | Freq: Every day | ORAL | Status: DC
  Administered 2020-09-05 – 2020-09-06 (×2): 10 mg via ORAL
  Filled 2020-09-04 (×2): qty 1

## 2020-09-04 MED ORDER — HEPARIN SODIUM (PORCINE) 5000 UNIT/ML IJ SOLN
5000.0000 [IU] | Freq: Three times a day (TID) | INTRAMUSCULAR | Status: DC
  Administered 2020-09-05 (×2): 5000 [IU] via SUBCUTANEOUS
  Filled 2020-09-04 (×2): qty 1

## 2020-09-04 MED ORDER — ACETAMINOPHEN 325 MG PO TABS
650.0000 mg | ORAL_TABLET | Freq: Four times a day (QID) | ORAL | Status: DC | PRN
  Administered 2020-09-05 (×2): 650 mg via ORAL
  Filled 2020-09-04 (×2): qty 2

## 2020-09-04 MED ORDER — INSULIN GLARGINE 100 UNIT/ML ~~LOC~~ SOLN: 20.0000 [IU] | Freq: Every day | SUBCUTANEOUS | Status: DC

## 2020-09-04 MED ORDER — VANCOMYCIN HCL 2000 MG/400ML IV SOLN
2000.0000 mg | Freq: Once | INTRAVENOUS | Status: AC
  Administered 2020-09-05: 2000 mg via INTRAVENOUS
  Filled 2020-09-04: qty 400

## 2020-09-04 MED ORDER — SODIUM CHLORIDE 0.9 % IV SOLN
3.0000 g | Freq: Once | INTRAVENOUS | Status: AC
  Administered 2020-09-05: 3 g via INTRAVENOUS
  Filled 2020-09-04: qty 3

## 2020-09-04 MED ORDER — MIRTAZAPINE 15 MG PO TABS
30.0000 mg | ORAL_TABLET | Freq: Every day | ORAL | Status: DC
  Administered 2020-09-05 – 2020-09-06 (×3): 30 mg via ORAL
  Filled 2020-09-04 (×2): qty 1
  Filled 2020-09-04 (×2): qty 2

## 2020-09-04 MED ORDER — GADOBUTROL 1 MMOL/ML IV SOLN
10.0000 mL | Freq: Once | INTRAVENOUS | Status: AC | PRN
  Administered 2020-09-04: 10 mL via INTRAVENOUS

## 2020-09-04 MED ORDER — INSULIN ASPART 100 UNIT/ML ~~LOC~~ SOLN
0.0000 [IU] | Freq: Three times a day (TID) | SUBCUTANEOUS | Status: DC
  Administered 2020-09-05 – 2020-09-06 (×3): 2 [IU] via SUBCUTANEOUS
  Administered 2020-09-06: 9 [IU] via SUBCUTANEOUS
  Administered 2020-09-07: 3 [IU] via SUBCUTANEOUS

## 2020-09-04 MED ORDER — MOMETASONE FURO-FORMOTEROL FUM 200-5 MCG/ACT IN AERO
2.0000 | INHALATION_SPRAY | Freq: Two times a day (BID) | RESPIRATORY_TRACT | Status: DC
  Administered 2020-09-06 – 2020-09-07 (×3): 2 via RESPIRATORY_TRACT
  Filled 2020-09-04 (×2): qty 8.8

## 2020-09-04 MED ORDER — GABAPENTIN 300 MG PO CAPS
300.0000 mg | ORAL_CAPSULE | Freq: Three times a day (TID) | ORAL | Status: DC
  Administered 2020-09-05 – 2020-09-07 (×8): 300 mg via ORAL
  Filled 2020-09-04 (×8): qty 1

## 2020-09-04 MED ORDER — ACETAMINOPHEN 650 MG RE SUPP: 650.0000 mg | Freq: Four times a day (QID) | RECTAL | Status: DC | PRN

## 2020-09-04 MED ORDER — PROPRANOLOL HCL ER 80 MG PO CP24
80.0000 mg | ORAL_CAPSULE | Freq: Every day | ORAL | Status: DC
  Administered 2020-09-05 – 2020-09-07 (×3): 80 mg via ORAL
  Filled 2020-09-04 (×3): qty 1

## 2020-09-04 MED FILL — BASAGLAR 100 UNIT/ML KWIKPE: 100 | 45 days supply | Qty: 9 | Fill #2

## 2020-09-04 MED FILL — METFORMIN HCL 1000 MG TABS: 1000 | 90 days supply | Qty: 180 | Fill #1

## 2020-09-04 NOTE — ED Provider Notes (Signed)
[facility] EMERGENCY DEPARTMENT Provider Note   CSN: [id number] Arrival date & time: 09/04/20  1145     History No chief complaint on file.   Christopher Fernandez is a 49 y.o. male with a past medical history of COPD, DM 2, osteomyelitis status post right transmetatarsal amputation with difficult wound healing, hypertension, alcoholism, asplenia, presents today for evaluation for concern of left toe infection.  History obtained from patient, and chart review. Patient reports that on Sunday he noticed swelling and pain in his left great toe that he says is similar to when his right great toe started getting infected.  He reports that he had leftover doxycycline and Cipro which he started taking Monday and has had a full 48 hours worth of antibiotics however feels like it continues to worsen.  He reports redness and swelling on his foot and lower leg.  He reports subjective fevers at home however notes those have resolved since starting the antibiotics.  He reports increased pain in the area.  No noted preceding trauma.  Patient reports that his toe is draining.  Patient had reported to other provider that the redness had stopped but both times I asked him he reports it is continuing to worsen.   HPI     Past Medical History:  Diagnosis Date  . Alcoholism ([place])   . Anemia   . Anxiety   . Arthritis   . Asthma   . COPD (chronic obstructive pulmonary disease) ([place])   . Depression   . Diabetes ([place])    type 2  . GERD (gastroesophageal reflux disease)   . History of hiatal hernia   . HTN (hypertension)   . Osteomyelitis ([place])    right forefoot  . Pancreatitis   . Pneumonia   . Wears glasses     Patient Active Problem List   Diagnosis Date Noted  . Cellulitis of left foot 09/04/2020  . Cellulitis 09/04/2020  . 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 Hyperlipidemia associated with type 2 diabetes mellitus ([place]) 03/15/2020  . MRSA infection 01/11/2020  . Amputation of  midfoot ([place]) 01/10/2020  . S/P transmetatarsal amputation of foot, right ([place]) 01/09/2020  . Asplenia 12/13/2019  . Exocrine pancreatic insufficiency 06/14/2018  . Uncontrolled diabetes mellitus with hyperglycemia, with long-term current use of insulin ([place]) 02/24/2018  . Alcohol use disorder, severe, dependence ([place]) 02/24/2018  . Anxiety associated with depression 02/24/2018  . GERD (gastroesophageal reflux disease) 02/24/2018  . Anemia 02/24/2018  . Asthma, intermittent 02/24/2018  . Tobacco use disorder 02/24/2018    Past Surgical History:  Procedure Laterality Date  . 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 AMPUTATION Right 10/18/2019   Procedure: RIGHT SECOND TOE AMPUTATION;  Surgeon: Newt Minion, MD;  Location: [facility];  Service: Orthopedics;  Laterality: Right;  . AMPUTATION Right 11/08/2019   Procedure: RIGHT TRANSMETATARSAL AMPUTATION;  Surgeon: Newt Minion, MD;  Location: [place];  Service: Orthopedics;  Laterality: Right;  . APPENDECTOMY    . APPLICATION OF WOUND VAC Right 01/10/2020   Procedure: APPLICATION OF WOUND VAC, right foot;  Surgeon: [name], MD;  Location: [place];  Service: Vascular;  Laterality: Right;  . CHOLECYSTECTOMY  2006   with gallbladder and spleen  . COLONOSCOPY  8-10 years ago    in [place] exam per pt  . HERNIA REPAIR  2008, 2010   hiatal hernia and 1 additional  . NASAL SEPTUM SURGERY    . PANCREATIC  PSEUDOCYST DRAINAGE    . SPLENECTOMY  2006  . STUMP REVISION Right 11/24/2019   Procedure: REVISION RIGHT TRANSMETATARSAL AMPUTATION;  Surgeon: Newt Minion, MD;  Location: [facility];  Service: Orthopedics;  Laterality: Right;  . TOTAL KNEE ARTHROPLASTY Right 1982, 1984  . WOUND DEBRIDEMENT Right 01/10/2020   Procedure: incisional DEBRIDEMENT of RIGHT TRANSMETATARSAL WOUND;  Surgeon: [name], MD;  Location[facility];  Service: Vascular;  Laterality: Right;       Family History  Problem Relation Age of  Onset  . Diabetes Mother   . Hypertension Mother   . Hyperlipidemia Mother   . Kidney disease Mother   . Thyroid disease Mother   . Breast cancer Mother        mets  . Lung cancer Mother   . Diabetes Father   . Alcohol abuse Sister   . Sickle cell trait Sister   . Diabetes Brother   . Asthma Brother   . Lung cancer Maternal Grandmother   . Kidney disease Maternal Grandmother   . Liver cancer Maternal Uncle        x 4-5  . Kidney disease Maternal Uncle   . Kidney disease Paternal Uncle   . Colon cancer Neg Hx   . Colon polyps Neg Hx   . Esophageal cancer Neg Hx   . Rectal cancer Neg Hx   . Stomach cancer Neg Hx     Social History   Tobacco Use  . Smoking status: Current Every Day Smoker    Packs/day: 1.00    Types: Cigarettes  . Smokeless tobacco: Never Used  Vaping Use  . Vaping Use: Never used  Substance Use Topics  . Alcohol use: Not Currently    Comment: 2-3 per day, none since 2019  . Drug use: Not Currently    Home Medications Prior to Admission medications   Medication Sig Start Date End Date Taking? Authorizing Provider  albuterol (VENTOLIN HFA) 108 (90 Base) MCG/ACT inhaler Inhale 2-4 puffs into the lungs every 4 (four) hours as needed for wheezing (or cough). Patient taking differently: Inhale 2 puffs into the lungs every 4 (four) hours as needed for wheezing. 09/20/19  Yes Mullis, Kiersten P, DO  ARIPiprazole (ABILIFY) 5 MG tablet TAKE 1 TABLET (5 MG TOTAL) BY MOUTH DAILY. 08/05/20  Yes Meccariello, Bernita Raisin, DO  atorvastatin (LIPITOR) 40 MG tablet Take 1 tablet (40 mg total) by mouth daily. 12/12/19  Yes Mullis, Kiersten P, DO  bismuth subsalicylate (PEPTO BISMOL) 262 MG/15ML suspension Take 30 mLs by mouth every 6 (six) hours as needed for indigestion or diarrhea or loose stools.   Yes [provider]  diphenhydrAMINE (BENADRYL) 25 MG tablet Take 25 mg by mouth every 6 (six) hours as needed for itching or allergies.    Yes [provider]   doxycycline (VIBRAMYCIN) 100 MG capsule Take 100 mg by mouth 2 (two) times daily. Start date : 09/02/20   Yes [provider]  DULoxetine (CYMBALTA) 60 MG capsule TAKE 1 CAPSULE (60 MG TOTAL) BY MOUTH DAILY. 08/05/20  Yes Meccariello, Bernita Raisin, DO  empagliflozin (JARDIANCE) 25 MG TABS tablet Take 25 mg by mouth daily. 10/27/19  Yes Mullis, Kiersten P, DO  Fluticasone-Salmeterol (ADVAIR DISKUS) 250-50 MCG/DOSE AEPB Inhale 1 puff into the lungs 2 (two) times daily. 05/10/19  Yes Mullis, Kiersten P, DO  gabapentin (NEURONTIN) 300 MG capsule TAKE 1 CAPSULE (300 MG TOTAL) BY MOUTH 3 (THREE) TIMES DAILY. 07/08/20  Yes Mullis, [employer]  P, DO  HUMALOG KWIKPEN 100 UNIT/ML KwikPen INJECT 0-10 UNITS TOTAL INTO THE SKIN 3 TIMES DAILY WITH MEALS. Patient taking differently: Inject 0-10 Units into the skin 3 (three) times daily. Sliding scale 08/05/20  Yes Meccariello, Bernita Raisin, DO  ibuprofen (ADVIL) 200 MG tablet Take 800 mg by mouth every 8 (eight) hours as needed for headache or moderate pain.    Yes [provider]  Insulin Glargine (BASAGLAR KWIKPEN) 100 UNIT/ML INJECT 0.2 MLS (20 UNITS TOTAL) INTO THE SKIN IN THE MORNING. 05/01/20  Yes Mullis, Kiersten P, DO  metFORMIN (GLUCOPHAGE) 1000 MG tablet TAKE 1 TABLET (1,000 MG TOTAL) BY MOUTH 2 (TWO) TIMES DAILY WITH A MEAL. 06/11/20  Yes Mullis, Kiersten P, DO  mirtazapine (REMERON) 30 MG tablet Take 1 tablet (30 mg total) by mouth at bedtime. 09/20/19  Yes Mullis, Kiersten P, DO  montelukast (SINGULAIR) 10 MG tablet TAKE 1 TABLET (10 MG TOTAL) BY MOUTH AT BEDTIME. 08/05/20  Yes Meccariello, Bernita Raisin, DO  propranolol ER (INDERAL LA) 80 MG 24 hr capsule Take 1 capsule (80 mg total) by mouth daily. 08/19/20  Yes Mullis, Kiersten P, DO  sitaGLIPtin (JANUVIA) 100 MG tablet Take 1 tablet (100 mg total) by mouth daily. 09/20/19  Yes Mullis, Kiersten P, DO  ciprofloxacin (CIPRO) 500 MG tablet Take 500 mg by mouth 2 (two) times daily. Stat Date : 09/02/20     [provider]  Continuous Blood Gluc Receiver (DEXCOM G6 RECEIVER) DEVI 1 Device by Does not apply route 4 (four) times daily. 03/15/20   Mullis, Kiersten P, DO  Continuous Blood Gluc Sensor (DEXCOM G6 SENSOR) MISC 1 Device by Does not apply route 4 (four) times daily. 03/15/20   Mullis, Kiersten P, DO  Continuous Blood Gluc Transmit (DEXCOM G6 TRANSMITTER) MISC 1 Device by Does not apply route 4 (four) times daily. 03/15/20   Mullis, Kiersten P, DO  EASY TOUCH PEN NEEDLES 31G X 5 MM MISC Inject 1 application as directed 3 (three) times daily. 10/27/19   Mullis, Kiersten P, DO  FREESTYLE LITE test strip USE AS INSTRUCTED 05/14/20   Mina Marble P, DO  glucose blood test strip USE AS INSTRUCTED 05/15/20   [provider]  Lancets (FREESTYLE) lancets Use as instructed 03/29/18   Harriet Butte, DO  gabapentin (NEURONTIN) 300 MG capsule TAKE 1 CAPSULE (300 MG TOTAL) BY MOUTH 3 (THREE) TIMES DAILY. 04/08/20   Mullis, Kiersten P, DO    Allergies    Eggs or egg-derived products, Morphine and related, and Cocoa  Review of Systems   Review of Systems  Constitutional: Positive for fever. Negative for chills and fatigue.  HENT: Negative for congestion.   Respiratory: Negative for shortness of breath.   Musculoskeletal:       Pain and swelling of toe  Skin: Positive for color change and wound.  Neurological: Negative for weakness and headaches.  Psychiatric/Behavioral: Negative for confusion.  All other systems reviewed and are negative.   Physical Exam Updated Vital Signs BP 116/68   Pulse 76   Temp 98 F (36.7 C) (Oral)   Resp 16   SpO2 96%   Physical Exam Vitals and nursing note reviewed.  Constitutional:      General: He is not in acute distress.    Appearance: He is not diaphoretic.  HENT:     Head: Normocephalic and atraumatic.  Eyes:     General: No scleral icterus.       Right eye: No discharge.  Left eye: No discharge.     Conjunctiva/sclera:  Conjunctivae normal.  Cardiovascular:     Rate and Rhythm: Normal rate and regular rhythm.  Pulmonary:     Effort: Pulmonary effort is normal. No respiratory distress.     Breath sounds: No stridor.  Abdominal:     General: There is no distension.  Musculoskeletal:        General: No deformity.     Cervical back: Normal range of motion.     Comments: Right foot status post transmetatarsal amputation.  Left foot with obvious erythema and edema localized around the left great toe.  Patient has pain with palpation over the great toe.  Skin:    General: Skin is warm and dry.     Comments: Please see clinical images taken earlier today by PCP.  There is obvious edema around the left foot primarily around the left great toe.  There is drainage from two wounds on the toe which is cultured. There is fluctuance around the nail with purulent drainage able to be expressed.   Neurological:     Mental Status: He is alert.     Cranial Nerves: No cranial nerve deficit.     Motor: No weakness or abnormal muscle tone.  Psychiatric:        Mood and Affect: Mood normal.        Behavior: Behavior normal.      Pictures taken earlier today by patients PCP.              ED Results / Procedures / Treatments   Labs (all labs ordered are listed, but only abnormal results are displayed) Labs Reviewed  COMPREHENSIVE METABOLIC PANEL - Abnormal; Notable for the following components:      Result Value   Glucose, Bld 145 (*)    All other components within normal limits  URINALYSIS, ROUTINE W REFLEX MICROSCOPIC - Abnormal; Notable for the following components:   Specific Gravity, Urine 1.035 (*)    Glucose, UA >=500 (*)    All other components within normal limits  RESP PANEL BY RT-PCR (FLU A&B, COVID) ARPGX2  AEROBIC/ANAEROBIC CULTURE (SURGICAL/DEEP WOUND)  LACTIC ACID, PLASMA  CBC WITH DIFFERENTIAL/PLATELET  CBC  BASIC METABOLIC PANEL    EKG None  Radiology DG Foot Complete  Left  Result Date: 09/04/2020 CLINICAL DATA:  Evaluate for infection of the great toe. Diabetic foot ulcer. EXAM: LEFT FOOT - COMPLETE 3+ VIEW COMPARISON:  None. FINDINGS: Soft tissue wound of the distal medial first toe. Bone destruction of the medial tuft of the first distal phalanx concerning for osteomyelitis. No acute fracture or dislocation. Mild osteoarthritis of the first MTP joint. IMPRESSION: 1. Soft tissue wound of the distal medial first toe. Bone destruction of the medial tuft of the first distal phalanx concerning for osteomyelitis. 2. Mild osteoarthritis of the first MTP joint. Electronically Signed   By: Kathreen Devoid   On: 09/04/2020 12:54    Procedures Procedures (including critical care time)  Medications Ordered in ED Medications  gadobutrol (GADAVIST) 1 MMOL/ML injection 10 mL (10 mLs Intravenous Contrast Given 09/04/20 1815)  oxyCODONE-acetaminophen (PERCOCET/ROXICET) 5-325 MG per tablet 1 tablet (1 tablet Oral Given 09/04/20 1948)    ED Course  I have reviewed the triage vital signs and the nursing notes.  Pertinent labs & imaging results that were available during my care of the patient were reviewed by me and considered in my medical decision making (see chart for details).  Clinical Course as  of 09/05/20 0012  Wed Sep 04, 2020  1609 49 yo w/ hx of osteomyelitis, MRSA, right toe amputations, diabetes, presenting to ED with left toe infection.  Symptoms of pain and redness began Sunday 3 days ago, he has started home doxycycline and cipro for 2 days (left over antibiotics), reports he had some redness to mid-leg that has arrested and not spread in 2 days.  Subjective fevers Sunday, none in past 3 days.  Went to PCP today and was referred to ED.  Here his vitals are normal, WBC 6.4, Lactate 1.1, no SIRS criteria.  He has some mild swelling of the toe, possible small draining paronchyia, no tunneling wound.  Plan for an MRI of the toe to evaluate for acute osteomyelitis.   If no acute findings on MRI, I would initiate PO clindamycin and refer him back to Dr Sharol Given, his orthopedist who performed the right foot amputation. [MT]    Clinical Course User Index [MT] Trifan, Carola Rhine, MD   MDM Rules/Calculators/A&P                         Patient is a 49 year old man who presents today for foot infection.  He has been taking PO antibiotics at home, however reports that he continues to have worsening redness and pain.  Here he does not meet sirs/sepsis criteria.  He is afebrile, not tachycardic or tachypnic.    X-ray shows concern for osteomyelitis of the great toe. MRI is ordered and obtained however unable to get this read tonight as there is not [facility] trained MRI radiologist on until morning, and radiologist I spoke with was not comfortable deciding if there was osteo.  Given that patient reports it has been continuing to worsen despite taking PO antibiotics at home, along with the extent of erythema in someone with known diabetic foot wounds on the other foot with poor healing I do not think discharge home is appropriate.  With christmas coming up his ability to get outpatient follow up in a timely manor would be compromised.   Ortho was not consulted as with out official MRI read I don't think that there is any acute intervention that needs to be done tonight and will defer to admitting team anticipating consult in morning (if needed) after MRI results.   I spoke with admission team who will see patient.   The patient appears reasonably stabilized for admission considering the current resources, flow, and capabilities available in the ED at this time, and I doubt any other [facility] requiring further screening and/or treatment in the ED prior to admission assuming timely admission and bed placement.  Note: Portions of this report may have been transcribed using voice recognition software. Every effort was made to ensure accuracy; however, inadvertent computerized transcription  errors may be present    Final Clinical Impression(s) / ED Diagnoses Final diagnoses:  Diabetic foot infection [facility])    Rx / [place] Orders ED Discharge Orders    None       Lorin Glass, PA-C 09/05/20 0018    Wyvonnia Dusky, MD 09/05/20 1013

## 2020-09-04 NOTE — Progress Notes (Signed)
Pharmacy Antibiotic Note  Christopher Fernandez is a 49 y.o. male admitted on 09/04/2020 with L great toe infection, r/o osteo.  Pharmacy has been consulted for Vanc and Unasyn one-time doses. MD to reassess in a.m.  Plan: Unasyn 3gm IV now Vancomycin 2000 mg IV now Will f/u renal function, micro data, and pt's clinical condition F/u MD assessment in a.m. to see if IV abx need to continue     Temp (24hrs), Avg:98.1 F (36.7 C), Min:98 F (36.7 C), Max:98.3 F (36.8 C)  Recent Labs  Lab 09/04/20 1241 09/04/20 1242  WBC  --  6.4  CREATININE  --  1.01  LATICACIDVEN 1.1  --     Estimated Creatinine Clearance: 120.8 mL/min (by C-G formula based on SCr of 1.01 mg/dL).    Allergies  Allergen Reactions  . Eggs Or Egg-Derived Products Rash  . Morphine And Related Other (See Comments)    Cant take because of pancreatitis  . Cocoa Rash    Antimicrobials this admission: 12/22 Vanc x 1 12/22 Unasyn x1    Thank you for allowing pharmacy to be a part of this patient's care.  Sherlon Handing, PharmD, BCPS Please see amion for complete clinical pharmacist phone list 09/04/2020 11:36 PM

## 2020-09-04 NOTE — Progress Notes (Signed)
Subjective:   Patient ID: Christopher Fernandez    DOB: 13-Jun-1971, 49 y.o. male   MRN: [id number]  Christopher Fernandez is a 49 y.o. male with a history of asthma, exocrine pancreatic insufficiency, GERD, HLD, uncontrolled DM, action tremor, alcohol use, amputation of midfoot and transmetatarsal of right foot, anemia, anxiety, asplenia, h/o MRSA infection, tobacco use disorder here for swollen foot  Swollen left foot: Patient messaged yesterday stating his left foot and ankle has become very swollen and is worsening. He is concerned it is cellulitis. He notes that the swelling started Sunday and has slowly worsening. He endorses 8/10 pain. He denies any trauma or open wounds. He notes that "it just started swelling on Sunday." He endorses tactile fever, chills, and body aches on Sunday and Monday but none since. He had some left over antibiotics at home thus started taking these on Monday. He took one dose of Cipro 500mg  and Doxy 100mg  on Monday, full dose of Cipro/Doxy BID on Tuesday, and one dose this morning.   Of note, patient has history of diabetic foot wound for osteomyelitis and MRSA infection that led to right transmetatarsal amputation with difficult healing post op. Required excisional debridement with VAC placement and IV antibiotics for osteomyelitis. He has been wearing the [place] shoe. The wound has completely healed. Last saw vascular surgery on 08/13/20. He has palpable pedal pulses in right so per chart review, thus no concerns from vascular standpoint.   Review of Systems:  Per HPI.   Objective:   BP 128/78   Pulse 77   Temp 98.3 F (36.8 C)   Ht 6\' 3"  (1.905 m)   Wt 264 lb 9.6 oz (120 kg)   SpO2 97%   BMI 33.07 kg/m  Vitals and nursing note reviewed.  General: pleasant middle aged man, sitting comfortably in exam chair, well nourished, well developed, in no acute distress with non-toxic appearance CV: regular rate and rhythm without murmurs, rubs, or gallops, 2+ pedal pulse on  right, unable to palpate right posterior tibial pulse  Lungs: clear to auscultation bilaterally with normal work of breathing on room air, speaking in full sentences Skin: mild erythema and warmth extending up to medial lower 3rd of shin with more significant erythema and warmth along distal dorsal right foot with two areas of active drainage along right great toe. Hypopigmentation and signs of decreased blood flow to right great toe Extremities: 3+ pitting edema to mid ankle on right, pain to palpation along dorsal foot and great toe MSK: antalgic gait but able to bear some weight to right foot Neuro: Alert and oriented, speech normal              Assessment & Plan:   Cellulitis of left foot Patient presenting with 3 days of worsening left lower extremity swelling and erythema. Past medical history of osteomyelitis and MRSA infection in right foot. He is s/p right transmetatarsal amputation with difficult wound healing in January 2021. 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 He had subjective fevers on Sunday and Monday which has resolved since starting his left-over PO antibiotics Doxycycline 100mg  and Ciprofloxacin 500mg  (4 total doses taken prior to visit today). He is hemodynamically stable on exam today with evidence of cellulitis. He does have 2+ pedal pulses on the right but concern for developing tissue death on tip of left great toe. Given his history, recommended ED evaluation and further imaging to rule out osteomyelitis. Likely benefit from IV antibiotics and Ortho consult. He  was agreeable to this plan. Patient has been safely transported to ED for further evaluation.   No orders of the defined types were placed in this encounter.  No orders of the defined types were placed in this encounter.   Orpah Cobb, DO PGY-3, [facility] 09/04/2020 11:47 AM

## 2020-09-04 NOTE — H&P (Addendum)
[facility] Admission History and Physical Service Pager: [phone]  Patient name: Christopher Fernandez Medical record number: [id number] Date of birth: 03/04/1971 Age: 49 y.o. Gender: male  Primary Care Provider: Danna Hefty, DO Consultants: None Code Status: DNR/DNI  Preferred Emergency Contact: Paulino Sartin (wife) [phone]  Chief Complaint: Foot infection  Assessment and Plan: Christopher Fernandez is a 49 y.o. male presenting with cellulitis of his left great toe. 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 Has been taking Cipro and Doxy for the last 2 days without improvement. Patient is a poorly controlled diabetic with hx of previous osteomyelitis and MRSA infection requiring right trans-metatarsal amputation.  In ED, VSS- no fever or tachycardia and normotensive. CBC WNL without leukocytosis, BMP with elevated glucose at 145, otherwise WNL. Lactic acid unremarkable at 1.1.  Left foot x-ray significant for soft tissue wound of distal medial first toe and bone destruction of the medial tuft of the first distal phalanx, concerning for osteomyelitis.  MRI of left foot was performed but final read is pending. Examination significant for warm, edematous and erythematous left foot. Open wound to both medial and lateral great first toe without any drainage, though patient admitted to clear drainage prior.  Patient requires admission for IV antibiotics due to failure of outpatient p.o. antibiotics.  Will need to  cover for MRSA due to prior history and Pseudomonas due to diabetes. Tetanus UTD. Will hold on blood cultures as patient labs WNL, VSS and he has already started PO abx.  Given acute onset of unilateral erythema and edema without improvement to po antibiotics also considered acute DVT as possible etiology.  Wells score for DVT at moderate risk and reports calf pain. -Admit for observation MedSurg, attending Dr. Andria Frames -Vitals per floor protocol  -PT/OT eval and treat -Follow-up MRI foot -LLE doppler ultrasound -Consult Ortho as needed; pending MRI result -IV Unasyn and Vancomycin, dosing per pharmacy, can reassess once MRI resulted  -Tylenol 650 mg q6h PRN mild pain -Daily CBC -Carb modified diet  T2DM with neuropathy: Chronic, stable Last Hgb A1c 08/19/20 of 7.9. On Jardiance 25 mg qd, metformin 1000 mg twice daily, sitagliptin 100 mg daily, Basaglar 20 units every morning and Humalog sliding scale 2 to 4 units 3 times daily.  Gabapentin 300 mg 3 times daily. -CBG before meals and nightly -Sensitive sliding scale insulin -Continue Jardiance 25mg   -Linagliptin 5 mg per formulary  -Lantus 20 U daily  -Hold Metformin -Continue gabapentin 300 mg 3 times daily -Consider Ozempic outpatient for better glycemic control -Consider diabetes educator  Hx of Anemia: stable  Hgb 13.4 today.  - Monitor with CBC   Hyperlipidemia: Chronic, stable Most recent lipid panel 12/12/2019 unremarkable.  Home medications include atorvastatin 40 mg daily. -Continue home atorvastatin daily  Hx asthma: Chronic, stable Home medications include albuterol as needed, Singulair 10 mg - Dulera, per formulary - Albuterol PRN  Hx alcohol abuse  Tobacco use disorder Has been abstinent from alcohol for the last 10 months.  Currently smoking 1 pack/day, previous history of 1 to 2 packs/day for 10  to 12 years.  About a 25-pack-year smoker. Declines nicotine patch.  -Encourage smoking cessation  Hx Tremor: chronic,  stable Recently started on 80 mg Propranolol ER by [facility] clinic. Patient without tremor on examination.  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 Not on prophylactic antibiotics. At risk for infections from encapsulated bacteria. Has had both COVID-19 vaccines, pneumococcal and Flu vaccine. -Appears that patient has not had Meningococal vaccine  Anxiety with Depression: Chronic, stable On aripiprazole 5 mg daily, Cymbalta 60 mg, mirtazapine 30 mg. - Continue home medications  FEN/GI: Heart healthy/carb modified Prophylaxis: Heparin  Disposition: med-surg  History of Present Illness:  Christopher Fernandez is a 49 y.o. male presenting with concern for cellulitis of left foot.  Patient seen earlier today at [facility] clinic for same complaint.  Was referred to ED for further management evaluation.  Patient noticed slight swelling of his left foot on Sunday.  No redness or pain at that time.  Redness started yesterday. Now with 8/10 burning pain.  Patient started taking Cipro and Doxy that was left over from previous script in February/March 2021. Had 4 total doses of this.  Felt that redness and swelling has increased even while taking antibiotics. Reports subjective fevers Sunday and Monday evening but unable to take temperature.  Felt chills and was diaphorteic. No numbness or tingling.  Open wound on foot with clear drainage.  Took some ibuprofen for the pain with some relief.  Seen Dr Sharol Given in the past.   Patient feels his diabetes is under good control, blood sugars at home range from 110's -130's.    ED course: Vital signs stable without tachycardia, fever, hypotension.  CBC and CMP within normal limits.  Left foot x-ray significant for bone destruction of the medial tuft of the first distal phalanx concerning for osteomyelitis.  MRI of the  left foot performed but pending final read.  Received Percocet 5-325 x 1 for pain control.  Review Of Systems: Per HPI with the following additions:   Review of Systems  Constitutional: Positive for chills and fever. Negative for fatigue.  Respiratory: Negative for cough, chest tightness and shortness of breath.   Cardiovascular: Positive for leg swelling. Negative for chest pain.  Gastrointestinal: Positive for nausea. Negative for blood in stool, constipation, diarrhea and vomiting.  Genitourinary: Positive for frequency and urgency. Negative for difficulty urinating, dysuria and hematuria.  Neurological: Positive for tremors. Negative for dizziness, seizures, weakness, numbness and headaches.     Patient Active Problem List   Diagnosis Date Noted  . Cellulitis of left foot 09/04/2020  . Cellulitis 09/04/2020  . 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 Hyperlipidemia associated with type 2 diabetes mellitus ([place]) 03/15/2020  . MRSA infection 01/11/2020  . 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[place]) 01/10/2020  . S/P transmetatarsal amputation of foot, right ([place]) 01/09/2020  . Asplenia 12/13/2019  . Exocrine pancreatic insufficiency 06/14/2018  . Uncontrolled diabetes mellitus with hyperglycemia, with long-term current use of insulin ([place]) 02/24/2018  . Alcohol use disorder, severe, dependence ([place]) 02/24/2018  . Anxiety associated with depression 02/24/2018  . GERD (gastroesophageal reflux disease) 02/24/2018  . Anemia 02/24/2018  . Asthma, intermittent 02/24/2018  . Tobacco use disorder 02/24/2018    Past Medical History: Past Medical History:  Diagnosis Date  . Alcoholism ([place])   . Anemia   . Anxiety   . Arthritis   .  Asthma   . COPD (chronic obstructive pulmonary disease) ([place])   . Depression   . Diabetes ([place])    type 2  . GERD (gastroesophageal reflux disease)   . History of hiatal hernia   . HTN (hypertension)   . Osteomyelitis ([place])    right forefoot  . Pancreatitis   . Pneumonia   .  Wears glasses     Past Surgical History: Past Surgical History:  Procedure Laterality Date  . 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 AMPUTATION Right 10/18/2019   Procedure: RIGHT SECOND TOE AMPUTATION;  Surgeon: Newt Minion, MD;  Location: [facility];  Service: Orthopedics;  Laterality: Right;  . AMPUTATION Right 11/08/2019   Procedure: RIGHT TRANSMETATARSAL AMPUTATION;  Surgeon: Newt Minion, MD;  Location: [facility];  Service: Orthopedics;  Laterality: Right;  . APPENDECTOMY    . APPLICATION OF WOUND VAC Right 01/10/2020   Procedure: APPLICATION OF WOUND VAC, right foot;  Surgeon: [name], MD;  Location: [facility];  Service: Vascular;  Laterality: Right;  . CHOLECYSTECTOMY  2006   with gallbladder and spleen  . COLONOSCOPY  8-10 years ago    in [name] exam per pt  . HERNIA REPAIR  2008, 2010   hiatal hernia and 1 additional  . NASAL SEPTUM SURGERY    . PANCREATIC PSEUDOCYST DRAINAGE    . SPLENECTOMY  2006  . STUMP REVISION Right 11/24/2019   Procedure: REVISION RIGHT TRANSMETATARSAL AMPUTATION;  Surgeon: Newt Minion, MD;  Location: [facility];  Service: Orthopedics;  Laterality: Right;  . TOTAL KNEE ARTHROPLASTY Right 1982, 1984  . WOUND DEBRIDEMENT Right 01/10/2020   Procedure: incisional DEBRIDEMENT of RIGHT TRANSMETATARSAL WOUND;  Surgeon: [name], MD;  Location: [facility];  Service: Vascular;  Laterality: Right;    Social History: Social History   Tobacco Use  . Smoking status: Current Every Day Smoker    Packs/day: 1.00    Types: Cigarettes  . Smokeless tobacco: Never Used  Vaping Use  . Vaping Use: Never used  Substance Use Topics  . Alcohol use: Not Currently    Comment: 2-3 per day, none since 2019  . Drug use: Not Currently   Additional social history:   Please also refer to relevant sections of EMR.  Family History: Family History  Problem Relation Age of Onset  . Diabetes Mother   . Hypertension Mother   . Hyperlipidemia  Mother   . Kidney disease Mother   . Thyroid disease Mother   . Breast cancer Mother        mets  . Lung cancer Mother   . Diabetes Father   . Alcohol abuse Sister   . Sickle cell trait Sister   . Diabetes Brother   . Asthma Brother   . Lung cancer Maternal Grandmother   . Kidney disease Maternal Grandmother   . Liver cancer Maternal Uncle        x 4-5  . Kidney disease Maternal Uncle   . Kidney disease Paternal Uncle   . Colon cancer Neg Hx   . Colon polyps Neg Hx   . Esophageal cancer Neg Hx   . Rectal cancer Neg Hx   . Stomach cancer Neg Hx     Allergies and Medications: Allergies  Allergen Reactions  . Eggs Or Egg-Derived Products Rash  . Morphine And Related Other (See Comments)    Cant take because of pancreatitis  . Cocoa Rash   No current  facility-administered medications on file prior to encounter.   Current Outpatient Medications on File Prior to Encounter  Medication Sig Dispense Refill  . albuterol (VENTOLIN HFA) 108 (90 Base) MCG/ACT inhaler Inhale 2-4 puffs into the lungs every 4 (four) hours as needed for wheezing (or cough). (Patient taking differently: Inhale 2 puffs into the lungs every 4 (four) hours as needed for wheezing.) 6.7 g 11  . ARIPiprazole (ABILIFY) 5 MG tablet TAKE 1 TABLET (5 MG TOTAL) BY MOUTH DAILY. 90 tablet 0  . atorvastatin (LIPITOR) 40 MG tablet Take 1 tablet (40 mg total) by mouth daily. 90 tablet 3  . bismuth subsalicylate (PEPTO BISMOL) 262 MG/15ML suspension Take 30 mLs by mouth every 6 (six) hours as needed for indigestion or diarrhea or loose stools.    . diphenhydrAMINE (BENADRYL) 25 MG tablet Take 25 mg by mouth every 6 (six) hours as needed for itching or allergies.     [name] doxycycline (VIBRAMYCIN) 100 MG capsule Take 100 mg by mouth 2 (two) times daily. Start date : 09/02/20    . DULoxetine (CYMBALTA) 60 MG capsule TAKE 1 CAPSULE (60 MG TOTAL) BY MOUTH DAILY. 90 capsule 0  . empagliflozin (JARDIANCE) 25 MG TABS tablet Take 25 mg  by mouth daily. 90 tablet 3  . Fluticasone-Salmeterol (ADVAIR DISKUS) 250-50 MCG/DOSE AEPB Inhale 1 puff into the lungs 2 (two) times daily. 180 each 3  . gabapentin (NEURONTIN) 300 MG capsule TAKE 1 CAPSULE (300 MG TOTAL) BY MOUTH 3 (THREE) TIMES DAILY. 90 capsule 2  . HUMALOG KWIKPEN 100 UNIT/ML KwikPen INJECT 0-10 UNITS TOTAL INTO THE SKIN 3 TIMES DAILY WITH MEALS. (Patient taking differently: Inject 0-10 Units into the skin 3 (three) times daily. Sliding scale) 9 mL 0  . ibuprofen (ADVIL) 200 MG tablet Take 800 mg by mouth every 8 (eight) hours as needed for headache or moderate pain.     . Insulin Glargine (BASAGLAR KWIKPEN) 100 UNIT/ML INJECT 0.2 MLS (20 UNITS TOTAL) INTO THE SKIN IN THE MORNING. 9 mL 2  . metFORMIN (GLUCOPHAGE) 1000 MG tablet TAKE 1 TABLET (1,000 MG TOTAL) BY MOUTH 2 (TWO) TIMES DAILY WITH A MEAL. 180 tablet 3  . mirtazapine (REMERON) 30 MG tablet Take 1 tablet (30 mg total) by mouth at bedtime. 90 tablet 3  . montelukast (SINGULAIR) 10 MG tablet TAKE 1 TABLET (10 MG TOTAL) BY MOUTH AT BEDTIME. 90 tablet 0  . propranolol ER (INDERAL LA) 80 MG 24 hr capsule Take 1 capsule (80 mg total) by mouth daily. 30 capsule 1  . sitaGLIPtin (JANUVIA) 100 MG tablet Take 1 tablet (100 mg total) by mouth daily. 30 tablet 10  . ciprofloxacin (CIPRO) 500 MG tablet Take 500 mg by mouth 2 (two) times daily. Stat Date : 09/02/20    . Continuous Blood Gluc Receiver (DEXCOM G6 RECEIVER) DEVI 1 Device by Does not apply route 4 (four) times daily. 1 each 11  . Continuous Blood Gluc Sensor (DEXCOM G6 SENSOR) MISC 1 Device by Does not apply route 4 (four) times daily. 3 each 11  . Continuous Blood Gluc Transmit (DEXCOM G6 TRANSMITTER) MISC 1 Device by Does not apply route 4 (four) times daily. 1 each 11  . EASY TOUCH PEN NEEDLES 31G X 5 MM MISC Inject 1 application as directed 3 (three) times daily. 100 each 11  . FREESTYLE LITE test strip USE AS INSTRUCTED 100 strip 12  . glucose blood test strip USE  AS INSTRUCTED    . Lancets (FREESTYLE)  lancets Use as instructed 100 each 12  . [DISCONTINUED] gabapentin (NEURONTIN) 300 MG capsule TAKE 1 CAPSULE (300 MG TOTAL) BY MOUTH 3 (THREE) TIMES DAILY. 90 capsule 2    Objective: BP (!) 139/96   Pulse 64   Temp 98 F (36.7 C) (Oral)   Resp 14   SpO2 96%  Exam: General: Pleasant, in no acute distress Eyes: EOMI, sclera anicteric  Neck: Supple Cardiovascular: Distant heart sounds 2/2 body habitus, RRR, without murmur, 2+ DP pulses intact b/l, 2+ radial pulses  Respiratory: Decreased breath sounds, inspiratory wheezing, without crackles or rales Gastrointestinal: non-tender in all quadrants, no R/G, normoactive bowel sounds MSK: Moving all extremities spontaneously Derm: Edematous, erythematous and warmth of left lower extremity, erythema is not crossing previous margins drawn at clinic   Neuro: No focal deficits, answering questions appropriately, speech clear without slurring  Psych: Mood appropriate   Labs and Imaging: CBC BMET  Recent Labs  Lab 09/04/20 1242  WBC 6.4  HGB 13.4  HCT 42.8  PLT 252   Recent Labs  Lab 09/04/20 1242  NA 137  K 4.2  CL 100  CO2 26  BUN 11  CREATININE 1.01  GLUCOSE 145*  CALCIUM 9.7     EKG: None ordered  DG Foot Complete Left  Result Date: 09/04/2020 CLINICAL DATA:  Evaluate for infection of the great toe. Diabetic foot ulcer. EXAM: LEFT FOOT - COMPLETE 3+ VIEW COMPARISON:  None. FINDINGS: Soft tissue wound of the distal medial first toe. Bone destruction of the medial tuft of the first distal phalanx concerning for osteomyelitis. No acute fracture or dislocation. Mild osteoarthritis of the first MTP joint. IMPRESSION: 1. Soft tissue wound of the distal medial first toe. Bone destruction of the medial tuft of the first distal phalanx concerning for osteomyelitis. 2. Mild osteoarthritis of the first MTP joint. Electronically Signed   By: Kathreen Devoid   On: 09/04/2020 12:54     Carollee Leitz,  MD 09/05/2020, 2:30 AM PGY-1, [place] Intern pager: [phone], text pages welcome   Carollee Leitz, MD PGY-2 Family Medicine Residency

## 2020-09-04 NOTE — ED Triage Notes (Signed)
Pt c/o pain/swelling to wound that began in L great toe. Pt c/o pain, possible fevers at home. Redness/swelling marked up to ankle

## 2020-09-04 NOTE — Assessment & Plan Note (Signed)
Patient presenting with 3 days of worsening left lower extremity swelling and erythema. Past medical history of osteomyelitis and MRSA infection in right foot. He is s/p right transmetatarsal amputation with difficult wound healing in January 2021. 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 He had subjective fevers on Sunday and Monday which has resolved since starting his left-over PO antibiotics Doxycycline 100mg  and Ciprofloxacin 500mg  (4 total doses taken prior to visit today). He is hemodynamically stable on exam today with evidence of cellulitis. He does have 2+ pedal pulses on the right but concern for developing tissue death on tip of left great toe. Given his history, recommended ED evaluation and further imaging to rule out osteomyelitis. Likely benefit from IV antibiotics and Ortho consult. He was agreeable to this plan. Patient has been safely transported to ED for further evaluation.

## 2020-09-05 ENCOUNTER — Observation Stay (HOSPITAL_COMMUNITY): Payer: 59

## 2020-09-05 DIAGNOSIS — I10 Essential (primary) hypertension: Secondary | ICD-10-CM | POA: Diagnosis present

## 2020-09-05 DIAGNOSIS — Z20822 Contact with and (suspected) exposure to covid-19: Secondary | ICD-10-CM | POA: Diagnosis present

## 2020-09-05 DIAGNOSIS — F1020 Alcohol dependence, uncomplicated: Secondary | ICD-10-CM | POA: Diagnosis present

## 2020-09-05 DIAGNOSIS — J449 Chronic obstructive pulmonary disease, unspecified: Secondary | ICD-10-CM | POA: Diagnosis present

## 2020-09-05 DIAGNOSIS — E1165 Type 2 diabetes mellitus with hyperglycemia: Secondary | ICD-10-CM | POA: Diagnosis present

## 2020-09-05 DIAGNOSIS — L538 Other specified erythematous conditions: Secondary | ICD-10-CM

## 2020-09-05 DIAGNOSIS — Z96651 Presence of right artificial knee joint: Secondary | ICD-10-CM | POA: Diagnosis present

## 2020-09-05 DIAGNOSIS — E785 Hyperlipidemia, unspecified: Secondary | ICD-10-CM | POA: Diagnosis present

## 2020-09-05 DIAGNOSIS — M4620 Osteomyelitis of vertebra, site unspecified: Secondary | ICD-10-CM | POA: Insufficient documentation

## 2020-09-05 DIAGNOSIS — E11628 Type 2 diabetes mellitus with other skin complications: Secondary | ICD-10-CM | POA: Insufficient documentation

## 2020-09-05 DIAGNOSIS — I96 Gangrene, not elsewhere classified: Secondary | ICD-10-CM | POA: Diagnosis not present

## 2020-09-05 DIAGNOSIS — Z89421 Acquired absence of other right toe(s): Secondary | ICD-10-CM | POA: Diagnosis not present

## 2020-09-05 DIAGNOSIS — J4520 Mild intermittent asthma, uncomplicated: Secondary | ICD-10-CM | POA: Diagnosis present

## 2020-09-05 DIAGNOSIS — E1140 Type 2 diabetes mellitus with diabetic neuropathy, unspecified: Secondary | ICD-10-CM | POA: Diagnosis present

## 2020-09-05 DIAGNOSIS — R609 Edema, unspecified: Secondary | ICD-10-CM

## 2020-09-05 DIAGNOSIS — E1152 Type 2 diabetes mellitus with diabetic peripheral angiopathy with gangrene: Secondary | ICD-10-CM | POA: Diagnosis present

## 2020-09-05 DIAGNOSIS — L089 Local infection of the skin and subcutaneous tissue, unspecified: Secondary | ICD-10-CM | POA: Diagnosis not present

## 2020-09-05 DIAGNOSIS — M869 Osteomyelitis, unspecified: Secondary | ICD-10-CM | POA: Insufficient documentation

## 2020-09-05 DIAGNOSIS — E1169 Type 2 diabetes mellitus with other specified complication: Secondary | ICD-10-CM | POA: Diagnosis present

## 2020-09-05 DIAGNOSIS — R6889 Other general symptoms and signs: Secondary | ICD-10-CM | POA: Diagnosis not present

## 2020-09-05 DIAGNOSIS — Z66 Do not resuscitate: Secondary | ICD-10-CM | POA: Diagnosis present

## 2020-09-05 DIAGNOSIS — Z9049 Acquired absence of other specified parts of digestive tract: Secondary | ICD-10-CM | POA: Diagnosis not present

## 2020-09-05 DIAGNOSIS — K219 Gastro-esophageal reflux disease without esophagitis: Secondary | ICD-10-CM | POA: Diagnosis present

## 2020-09-05 DIAGNOSIS — Z825 Family history of asthma and other chronic lower respiratory diseases: Secondary | ICD-10-CM | POA: Diagnosis not present

## 2020-09-05 DIAGNOSIS — L97409 Non-pressure chronic ulcer of unspecified heel and midfoot with unspecified severity: Secondary | ICD-10-CM | POA: Diagnosis present

## 2020-09-05 DIAGNOSIS — L03116 Cellulitis of left lower limb: Secondary | ICD-10-CM | POA: Diagnosis present

## 2020-09-05 DIAGNOSIS — L03032 Cellulitis of left toe: Secondary | ICD-10-CM | POA: Diagnosis present

## 2020-09-05 DIAGNOSIS — Z8614 Personal history of Methicillin resistant Staphylococcus aureus infection: Secondary | ICD-10-CM | POA: Diagnosis not present

## 2020-09-05 DIAGNOSIS — Z9081 Acquired absence of spleen: Secondary | ICD-10-CM | POA: Diagnosis not present

## 2020-09-05 DIAGNOSIS — L0390 Cellulitis, unspecified: Secondary | ICD-10-CM | POA: Diagnosis present

## 2020-09-05 DIAGNOSIS — M86172 Other acute osteomyelitis, left ankle and foot: Secondary | ICD-10-CM | POA: Diagnosis not present

## 2020-09-05 DIAGNOSIS — K863 Pseudocyst of pancreas: Secondary | ICD-10-CM | POA: Diagnosis present

## 2020-09-05 LAB — BASIC METABOLIC PANEL
Anion gap: 9 (ref 5–15)
BUN: 11 mg/dL (ref 6–20)
CO2: 25 mmol/L (ref 22–32)
Calcium: 8.7 mg/dL — ABNORMAL LOW (ref 8.9–10.3)
Chloride: 102 mmol/L (ref 98–111)
Creatinine, Ser: 0.83 mg/dL (ref 0.61–1.24)
GFR, Estimated: >60 mL/min (ref >60)
Glucose, Bld: 200 mg/dL — ABNORMAL HIGH (ref 70–99)
Potassium: 4.6 mmol/L (ref 3.5–5.1)
Sodium: 136 mmol/L (ref 135–145)

## 2020-09-05 LAB — CBC
HCT: 40 % (ref 39.0–52.0)
HCT: 38.5 % — ABNORMAL LOW (ref 39.0–52.0)
Hemoglobin: 12.7 g/dL — ABNORMAL LOW (ref 13.0–17.0)
Hemoglobin: 12.4 g/dL — ABNORMAL LOW (ref 13.0–17.0)
MCH: 28.8 pg (ref 26.0–34.0)
MCH: 28.5 pg (ref 26.0–34.0)
MCHC: 31.8 g/dL (ref 30.0–36.0)
MCHC: 32.2 g/dL (ref 30.0–36.0)
MCV: 90.7 fL (ref 80.0–100.0)
MCV: 88.5 fL (ref 80.0–100.0)
Platelets: 256 10*3/uL (ref 150–400)
Platelets: 257 10*3/uL (ref 150–400)
RBC: 4.41 MIL/uL (ref 4.22–5.81)
RBC: 4.35 MIL/uL (ref 4.22–5.81)
RDW: 14.4 % (ref 11.5–15.5)
RDW: 14.2 % (ref 11.5–15.5)
WBC: 6.5 10*3/uL (ref 4.0–10.5)
WBC: 6.8 10*3/uL (ref 4.0–10.5)
nRBC: 0 % (ref 0.0–0.2)
nRBC: 0 % (ref 0.0–0.2)

## 2020-09-05 LAB — CBG MONITORING, ED
Glucose-Capillary: 174 mg/dL — ABNORMAL HIGH (ref 70–99)
Glucose-Capillary: 140 mg/dL — ABNORMAL HIGH (ref 70–99)

## 2020-09-05 LAB — GLUCOSE, CAPILLARY
Glucose-Capillary: 176 mg/dL — ABNORMAL HIGH (ref 70–99)
Glucose-Capillary: 153 mg/dL — ABNORMAL HIGH (ref 70–99)

## 2020-09-05 MED ORDER — IBUPROFEN 600 MG PO TABS: 600.0000 mg | ORAL_TABLET | Freq: Four times a day (QID) | ORAL | Status: DC | PRN

## 2020-09-05 MED ORDER — VANCOMYCIN HCL 1250 MG/250ML IV SOLN
1250.0000 mg | Freq: Two times a day (BID) | INTRAVENOUS | Status: DC
  Administered 2020-09-05 – 2020-09-06 (×4): 1250 mg via INTRAVENOUS
  Filled 2020-09-05 (×5): qty 250

## 2020-09-05 MED ORDER — ACETAMINOPHEN 500 MG PO TABS
1000.0000 mg | ORAL_TABLET | Freq: Four times a day (QID) | ORAL | Status: DC
  Administered 2020-09-05 – 2020-09-07 (×6): 1000 mg via ORAL
  Filled 2020-09-05 (×7): qty 2

## 2020-09-05 MED ORDER — INSULIN GLARGINE 100 UNIT/ML ~~LOC~~ SOLN
20.0000 [IU] | Freq: Every day | SUBCUTANEOUS | Status: DC
  Administered 2020-09-05 – 2020-09-07 (×3): 20 [IU] via SUBCUTANEOUS
  Filled 2020-09-05 (×5): qty 0.2

## 2020-09-05 MED ORDER — ENOXAPARIN SODIUM 40 MG/0.4ML ~~LOC~~ SOLN: 40.0000 mg | SUBCUTANEOUS | Status: DC

## 2020-09-05 MED ORDER — ENOXAPARIN SODIUM 40 MG/0.4ML ~~LOC~~ SOLN
40.0000 mg | SUBCUTANEOUS | Status: DC
  Administered 2020-09-05: 40 mg via SUBCUTANEOUS
  Filled 2020-09-05: qty 0.4

## 2020-09-05 MED ORDER — SODIUM CHLORIDE 0.9 % IV SOLN
3.0000 g | Freq: Four times a day (QID) | INTRAVENOUS | Status: DC
  Administered 2020-09-05 – 2020-09-07 (×9): 3 g via INTRAVENOUS
  Filled 2020-09-05 (×7): qty 8
  Filled 2020-09-05: qty 3
  Filled 2020-09-05 (×4): qty 8
  Filled 2020-09-05: qty 3
  Filled 2020-09-05: qty 8

## 2020-09-05 NOTE — Progress Notes (Signed)
Pharmacy Antibiotic Note  Christopher Fernandez is a 49 y.o. male admitted on 09/04/2020 with LLE osteomyelitis.  Pharmacy has been consulted for vancomycin and ampicillin/sulbactam dosing. Patient took 48 hrs of cipro and doxy prior to admission.   Already received vanc loading dose. ClCr > 124ml/min. Wound culture and ortho recs pending.  Plan:  Amp/sulb 3g Q6hr Vanc 1250mg  q12hr  Monitor cultures, clinical status, renal fx Narrow abx as able and f/u duration    Temp (24hrs), Avg:98.1 F (36.7 C), Min:98 F (36.7 C), Max:98.3 F (36.8 C)  Recent Labs  Lab 09/04/20 1241 09/04/20 1242 09/05/20 0642  WBC  --  6.4 6.5  CREATININE  --  1.01 0.83  LATICACIDVEN 1.1  --   --     Estimated Creatinine Clearance: 147 mL/min (by C-G formula based on SCr of 0.83 mg/dL).    Allergies  Allergen Reactions  . Eggs Or Egg-Derived Products Rash  . Morphine And Related Other (See Comments)    Cant take because of pancreatitis  . Cocoa Rash    Antimicrobials this admission: Vanc 12/23  Ampsulb 12/23 >>    Microbiology results: 12/22 wound cx ngtd (took cipro/doxy PTA)   Thank you for allowing pharmacy to be a part of this patient's care.    Benetta Spar, PharmD, BCPS, BCCP Clinical Pharmacist  Please check AMION for all [place] phone numbers After 10:00 PM, call [place] [phone]

## 2020-09-05 NOTE — H&P (View-Only) (Signed)
Hospital Consult    Reason for Consult:  Left great toe osteomyelitis Requesting Physician:  Dr. Tarry Kos MRN #:  [id number]  History of Present Illness: This is a 49 y.o. male who is familiar to [facility] with recent right TMA revision by Dr. Carlis Abbott on 01/10/20. Vascular surgery has been consulted for evaluation of new left great toe osteomyelitis. Patient explains that on Sunday he began having left great toe and foot swelling pain. The pain is mostly in the left great toe extending on the dorsum of the left foot to the medioposterior left ankle. He also reports subjective fever and chills on Monday and Tuesday but none since. He says there has been drainage from the great toe as well over the past couple days. Prior to this he reports no pain or discomfort in the left leg. He does not have swelling in his legs or feet normally. He does not report any claudication symptoms or rest pain. His right TMA has been healing well  Past Medical History:  Diagnosis Date  . Alcoholism ([place])   . Anemia   . Anxiety   . Arthritis   . Asthma   . COPD (chronic obstructive pulmonary disease) ([place])   . Depression   . Diabetes ([place])    type 2  . GERD (gastroesophageal reflux disease)   . History of hiatal hernia   . HTN (hypertension)   . Osteomyelitis ([place])    right forefoot  . Pancreatitis   . Pneumonia   . Wears glasses     Past Surgical History:  Procedure Laterality Date  . 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 AMPUTATION Right 10/18/2019   Procedure: RIGHT SECOND TOE AMPUTATION;  Surgeon: Newt Minion, MD;  Location: [place];  Service: Orthopedics;  Laterality: Right;  . AMPUTATION Right 11/08/2019   Procedure: RIGHT TRANSMETATARSAL AMPUTATION;  Surgeon: Newt Minion, MD;  Location: [place];  Service: Orthopedics;  Laterality: Right;  . APPENDECTOMY    . APPLICATION OF WOUND VAC Right 01/10/2020   Procedure: APPLICATION OF WOUND VAC, right foot;  Surgeon: [name], MD;   Location: [place];  Service: Vascular;  Laterality: Right;  . CHOLECYSTECTOMY  2006   with gallbladder and spleen  . COLONOSCOPY  8-10 years ago    in [place] exam per pt  . HERNIA REPAIR  2008, 2010   hiatal hernia and 1 additional  . NASAL SEPTUM SURGERY    . PANCREATIC PSEUDOCYST DRAINAGE    . SPLENECTOMY  2006  . STUMP REVISION Right 11/24/2019   Procedure: REVISION RIGHT TRANSMETATARSAL AMPUTATION;  Surgeon: Newt Minion, MD;  Location: [place];  Service: Orthopedics;  Laterality: Right;  . TOTAL KNEE ARTHROPLASTY Right 1982, 1984  . WOUND DEBRIDEMENT Right 01/10/2020   Procedure: incisional DEBRIDEMENT of RIGHT TRANSMETATARSAL WOUND;  Surgeon: [name], MD;  Location: [place];  Service: Vascular;  Laterality: Right;    Allergies  Allergen Reactions  . Eggs Or Egg-Derived Products Rash  . Morphine And Related Other (See Comments)    Cant take because of pancreatitis  . Cocoa Rash    Prior to Admission medications   Medication Sig Start Date End Date Taking? Authorizing Provider  albuterol (VENTOLIN HFA) 108 (90 Base) MCG/ACT inhaler Inhale 2-4 puffs into the lungs every 4 (four) hours as needed for wheezing (or cough). Patient taking differently: Inhale 2 puffs into the lungs every 4 (four) hours as needed for wheezing. 09/20/19  Yes  Mullis, Kiersten P, DO  ARIPiprazole (ABILIFY) 5 MG tablet TAKE 1 TABLET (5 MG TOTAL) BY MOUTH DAILY. 08/05/20  Yes Meccariello, Bernita Raisin, DO  atorvastatin (LIPITOR) 40 MG tablet Take 1 tablet (40 mg total) by mouth daily. 12/12/19  Yes Mullis, Kiersten P, DO  bismuth subsalicylate (PEPTO BISMOL) 262 MG/15ML suspension Take 30 mLs by mouth every 6 (six) hours as needed for indigestion or diarrhea or loose stools.   Yes [provider]  diphenhydrAMINE (BENADRYL) 25 MG tablet Take 25 mg by mouth every 6 (six) hours as needed for itching or allergies.    Yes [provider]  doxycycline (VIBRAMYCIN) 100 MG capsule Take 100  mg by mouth 2 (two) times daily. Start date : 09/02/20   Yes [provider]  DULoxetine (CYMBALTA) 60 MG capsule TAKE 1 CAPSULE (60 MG TOTAL) BY MOUTH DAILY. 08/05/20  Yes Meccariello, Bernita Raisin, DO  empagliflozin (JARDIANCE) 25 MG TABS tablet Take 25 mg by mouth daily. 10/27/19  Yes Mullis, Kiersten P, DO  Fluticasone-Salmeterol (ADVAIR DISKUS) 250-50 MCG/DOSE AEPB Inhale 1 puff into the lungs 2 (two) times daily. 05/10/19  Yes Mullis, Kiersten P, DO  gabapentin (NEURONTIN) 300 MG capsule TAKE 1 CAPSULE (300 MG TOTAL) BY MOUTH 3 (THREE) TIMES DAILY. 07/08/20  Yes Mullis, Kiersten P, DO  HUMALOG KWIKPEN 100 UNIT/ML KwikPen INJECT 0-10 UNITS TOTAL INTO THE SKIN 3 TIMES DAILY WITH MEALS. Patient taking differently: Inject 0-10 Units into the skin 3 (three) times daily. Sliding scale 08/05/20  Yes Meccariello, Bernita Raisin, DO  ibuprofen (ADVIL) 200 MG tablet Take 800 mg by mouth every 8 (eight) hours as needed for headache or moderate pain.    Yes [provider]  Insulin Glargine (BASAGLAR KWIKPEN) 100 UNIT/ML INJECT 0.2 MLS (20 UNITS TOTAL) INTO THE SKIN IN THE MORNING. 05/01/20  Yes Mullis, Kiersten P, DO  metFORMIN (GLUCOPHAGE) 1000 MG tablet TAKE 1 TABLET (1,000 MG TOTAL) BY MOUTH 2 (TWO) TIMES DAILY WITH A MEAL. 06/11/20  Yes Mullis, Kiersten P, DO  mirtazapine (REMERON) 30 MG tablet Take 1 tablet (30 mg total) by mouth at bedtime. 09/20/19  Yes Mullis, Kiersten P, DO  montelukast (SINGULAIR) 10 MG tablet TAKE 1 TABLET (10 MG TOTAL) BY MOUTH AT BEDTIME. 08/05/20  Yes Meccariello, Bernita Raisin, DO  propranolol ER (INDERAL LA) 80 MG 24 hr capsule Take 1 capsule (80 mg total) by mouth daily. 08/19/20  Yes Mullis, Kiersten P, DO  sitaGLIPtin (JANUVIA) 100 MG tablet Take 1 tablet (100 mg total) by mouth daily. 09/20/19  Yes Mullis, Kiersten P, DO  ciprofloxacin (CIPRO) 500 MG tablet Take 500 mg by mouth 2 (two) times daily. Stat Date : 09/02/20    [provider]  Continuous Blood Gluc  Receiver (DEXCOM G6 RECEIVER) DEVI 1 Device by Does not apply route 4 (four) times daily. 03/15/20   Mullis, Kiersten P, DO  EASY TOUCH PEN NEEDLES 31G X 5 MM MISC Inject 1 application as directed 3 (three) times daily. 10/27/19   Mullis, Kiersten P, DO  FREESTYLE LITE test strip USE AS INSTRUCTED 05/14/20   Mina Marble P, DO  glucose blood test strip USE AS INSTRUCTED 05/15/20   [provider]  Lancets (FREESTYLE) lancets Use as instructed 03/29/18   Harriet Butte, DO  gabapentin (NEURONTIN) 300 MG capsule TAKE 1 CAPSULE (300 MG TOTAL) BY MOUTH 3 (THREE) TIMES DAILY. 04/08/20   Danna Hefty, DO    Social History   Socioeconomic History  . Marital  status: Married    Spouse name: Not on file  . Number of children: 1  . Years of education: Not on file  . Highest education level: Not on file  Occupational History  . Not on file  Tobacco Use  . Smoking status: Current Every Day Smoker    Packs/day: 1.00    Types: Cigarettes  . Smokeless tobacco: Never Used  Vaping Use  . Vaping Use: Never used  Substance and Sexual Activity  . Alcohol use: Not Currently    Comment: 2-3 per day, none since 2019  . Drug use: Not Currently  . Sexual activity: Not on file  Other Topics Concern  . Not on file  Social History Narrative  . Not on file   Social Determinants of Health   Financial Resource Strain: Not on file  Food Insecurity: Not on file  Transportation Needs: Not on file  Physical Activity: Not on file  Stress: Not on file  Social Connections: Not on file  Intimate Partner Violence: Not on file    Family History  Problem Relation Age of Onset  . Diabetes Mother   . Hypertension Mother   . Hyperlipidemia Mother   . Kidney disease Mother   . Thyroid disease Mother   . Breast cancer Mother        mets  . Lung cancer Mother   . Diabetes Father   . Alcohol abuse Sister   . Sickle cell trait Sister   . Diabetes Brother   . Asthma Brother   . Lung cancer  Maternal Grandmother   . Kidney disease Maternal Grandmother   . Liver cancer Maternal Uncle        x 4-5  . Kidney disease Maternal Uncle   . Kidney disease Paternal Uncle   . Colon cancer Neg Hx   . Colon polyps Neg Hx   . Esophageal cancer Neg Hx   . Rectal cancer Neg Hx   . Stomach cancer Neg Hx     ROS: Otherwise negative unless mentioned in HPI  Physical Examination  Vitals:   09/05/20 1215 09/05/20 1340  BP:  125/75  Pulse: 69 72  Resp: 11 17  Temp:  98.7 F (37.1 C)  SpO2: 96% 100%   There is no height or weight on file to calculate BMI.  General:  WDWN in NAD Gait: Not observed HENT: WNL, normocephalic Pulmonary: normal non-labored breathing Cardiac: regular rate and fevers Abdomen: soft, NT/ND, no masses Skin: without rashes Vascular Exam/Pulses: 2+ femoral pulses bilaterally, 2+ popliteal, Dp and PT pulses. Feet warm and well perfused. Right TMA healing with some central callus. Left  foot edematous. Left great toe with swelling and drainage. Ischemic changes to distal phalax with ulceration. Small plantar surface of 2nd toe with superficial eschar. Diminished sensation of plantar foot. Motor intact Musculoskeletal: no muscle wasting or atrophy  Neurologic: A&O X 3;  No focal weakness or paresthesias are detected; speech is fluent/normal Psychiatric:  The pt has Normal affect. Lymph:  Unremarkable  CBC    Component Value Date/Time   WBC 6.5 09/05/2020 0642   RBC 4.41 09/05/2020 0642   HGB 12.7 (L) 09/05/2020 0642   HGB 12.6 (L) 02/24/2018 1442   HCT 40.0 09/05/2020 0642   HCT 38.2 02/24/2018 1442   PLT 256 09/05/2020 0642   PLT 275 02/24/2018 1442   MCV 90.7 09/05/2020 0642   MCV 88 02/24/2018 1442   MCH 28.8 09/05/2020 0642   MCHC 31.8 09/05/2020 [id number]  RDW 14.4 09/05/2020 0642   RDW 13.8 02/24/2018 1442   LYMPHSABS 1.8 09/04/2020 1242   LYMPHSABS 1.8 02/24/2018 1442   MONOABS 0.7 09/04/2020 1242   EOSABS 0.2 09/04/2020 1242   EOSABS 0.1  02/24/2018 1442   BASOSABS 0.0 09/04/2020 1242   BASOSABS 0.0 02/24/2018 1442    BMET    Component Value Date/Time   NA 136 09/05/2020 0642   NA 139 03/12/2020 1508   K 4.6 09/05/2020 0642   CL 102 09/05/2020 0642   CO2 25 09/05/2020 0642   GLUCOSE 200 (H) 09/05/2020 0642   BUN 11 09/05/2020 0642   BUN 8 03/12/2020 1508   CREATININE 0.83 09/05/2020 0642   CALCIUM 8.7 (L) 09/05/2020 0642   GFRNONAA >60 09/05/2020 0642   GFRAA 116 03/12/2020 1508    COAGS: Lab Results  Component Value Date   INR 1.0 01/10/2020   INR 1.0 06/14/2019     Non-Invasive Vascular Imaging:  09/05/20 ABI shows triphasic waveforms bilaterally   MR Foot left w wo contrast: 09/05/20 1. Soft tissue ulceration along the plantar aspect of the distal great toe with a small underlying soft tissue abscess. 2. Total erosion of the distal 1st phalanx with marrow edema and enhancement compatible with osteomyelitis. 3. Fatty atrophy with T2 hyperintensity and enhancement within the abductor hallucis muscle, likely reflecting subacute denervation.  DG Foot Complete left 09/04/20: 1. Soft tissue wound of the distal medial first toe. Bone destruction of the medial tuft of the first distal phalanx concerning for osteomyelitis. 2. Mild osteoarthritis of the first MTP joint.  Statin:  Yes.   Beta Blocker:  Yes.   Aspirin:  No. ACEI:  No. ARB:  No. CCB use:  No Other antiplatelets/anticoagulants:  No.    ASSESSMENT/PLAN: This is a 49 y.o. male who presents with recent onset of left foot and great toe swelling with pain and drainage from left great toe over past 4 days. MRI and Xray of foot show osteomyelitis of distal 1st phalanx. His non invasive studies show triphasic waveforms and he has easily palpable pulses on examination. I do not have any concern from vascular standpoint about adequate perfusion of his lower extremities. I recommend amputation of the left great toe. He is currently on Vanc and  Zosyn. Afebrile and without leukocytosis. He was additionally seen in consultation by Orthopedics and per patient preference he would like vascular surgery to perform his surgery. Patient has eaten lunch today so so he will be posted for left great toe amputation tomorrow with Dr. Donzetta Matters. He will need to be NPO after midnight   Karoline Caldwell PA-C [facility] [phone] 09/05/2020  1:56 PM   I have seen and evaluated the patient. I agree with the PA note as documented above.  49 year old male with DM that vascular surgery has been consulted for osteomyelitis of left great toe.  Patient was admitted to the hospital yesterday and states he has had pus draining from the tip of the toe.  MRI shows small soft tissue abscess in the distal great toe with osteomyelitis.  Cellulitis has improved with IV antibiotics.  He has easily palpable dorsalis pedis pulse.  His ABIs are 1.34 with a triphasic waveform at the ankle.  I recommended a left great toe amputation.  He would prefer that vascular surgery do this given I have taken care of him before for his right TMA.  Unfortunately cannot do this today because patient just ate prior to my exam.  I have tentatively posted him tomorrow for left great toe amputation with Dr. Donzetta Matters.  I discussed there would be some risk that he would get canceled or delayed given other emergencies over the holiday weekend.  [name], MD [facility] Office: [phone]

## 2020-09-05 NOTE — Consult Note (Addendum)
Hospital Consult    Reason for Consult:  Left great toe osteomyelitis Requesting Physician:  Dr. Tarry Kos MRN #:  [id number]  History of Present Illness: This is a 49 y.o. male who is familiar to [facility] with recent right TMA revision by Dr. Carlis Abbott on 01/10/20. Vascular surgery has been consulted for evaluation of new left great toe osteomyelitis. Patient explains that on Sunday he began having left great toe and foot swelling pain. The pain is mostly in the left great toe extending on the dorsum of the left foot to the medioposterior left ankle. He also reports subjective fever and chills on Monday and Tuesday but none since. He says there has been drainage from the great toe as well over the past couple days. Prior to this he reports no pain or discomfort in the left leg. He does not have swelling in his legs or feet normally. He does not report any claudication symptoms or rest pain. His right TMA has been healing well  Past Medical History:  Diagnosis Date  . Alcoholism ([place])   . Anemia   . Anxiety   . Arthritis   . Asthma   . COPD (chronic obstructive pulmonary disease) ([place])   . Depression   . Diabetes ([place])    type 2  . GERD (gastroesophageal reflux disease)   . History of hiatal hernia   . HTN (hypertension)   . Osteomyelitis ([place])    right forefoot  . Pancreatitis   . Pneumonia   . Wears glasses     Past Surgical History:  Procedure Laterality Date  . 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 AMPUTATION Right 10/18/2019   Procedure: RIGHT SECOND TOE AMPUTATION;  Surgeon: Newt Minion, MD;  Location: [place];  Service: Orthopedics;  Laterality: Right;  . AMPUTATION Right 11/08/2019   Procedure: RIGHT TRANSMETATARSAL AMPUTATION;  Surgeon: Newt Minion, MD;  Location: [place];  Service: Orthopedics;  Laterality: Right;  . APPENDECTOMY    . APPLICATION OF WOUND VAC Right 01/10/2020   Procedure: APPLICATION OF WOUND VAC, right foot;  Surgeon: [name], MD;   Location: [facility];  Service: Vascular;  Laterality: Right;  . CHOLECYSTECTOMY  2006   with gallbladder and spleen  . COLONOSCOPY  8-10 years ago    in [place] exam per pt  . HERNIA REPAIR  2008, 2010   hiatal hernia and 1 additional  . NASAL SEPTUM SURGERY    . PANCREATIC PSEUDOCYST DRAINAGE    . SPLENECTOMY  2006  . STUMP REVISION Right 11/24/2019   Procedure: REVISION RIGHT TRANSMETATARSAL AMPUTATION;  Surgeon: Newt Minion, MD;  Location: [place];  Service: Orthopedics;  Laterality: Right;  . TOTAL KNEE ARTHROPLASTY Right 1982, 1984  . WOUND DEBRIDEMENT Right 01/10/2020   Procedure: incisional DEBRIDEMENT of RIGHT TRANSMETATARSAL WOUND;  Surgeon: [name], MD;  Location: [place];  Service: Vascular;  Laterality: Right;    Allergies  Allergen Reactions  . Eggs Or Egg-Derived Products Rash  . Morphine And Related Other (See Comments)    Cant take because of pancreatitis  . Cocoa Rash    Prior to Admission medications   Medication Sig Start Date End Date Taking? Authorizing Provider  albuterol (VENTOLIN HFA) 108 (90 Base) MCG/ACT inhaler Inhale 2-4 puffs into the lungs every 4 (four) hours as needed for wheezing (or cough). Patient taking differently: Inhale 2 puffs into the lungs every 4 (four) hours as needed for wheezing. 09/20/19  Yes  Mullis, Kiersten P, DO  ARIPiprazole (ABILIFY) 5 MG tablet TAKE 1 TABLET (5 MG TOTAL) BY MOUTH DAILY. 08/05/20  Yes Meccariello, Bernita Raisin, DO  atorvastatin (LIPITOR) 40 MG tablet Take 1 tablet (40 mg total) by mouth daily. 12/12/19  Yes Mullis, Kiersten P, DO  bismuth subsalicylate (PEPTO BISMOL) 262 MG/15ML suspension Take 30 mLs by mouth every 6 (six) hours as needed for indigestion or diarrhea or loose stools.   Yes [provider]  diphenhydrAMINE (BENADRYL) 25 MG tablet Take 25 mg by mouth every 6 (six) hours as needed for itching or allergies.    Yes [provider]  doxycycline (VIBRAMYCIN) 100 MG capsule Take 100  mg by mouth 2 (two) times daily. Start date : 09/02/20   Yes [provider]  DULoxetine (CYMBALTA) 60 MG capsule TAKE 1 CAPSULE (60 MG TOTAL) BY MOUTH DAILY. 08/05/20  Yes Meccariello, Bernita Raisin, DO  empagliflozin (JARDIANCE) 25 MG TABS tablet Take 25 mg by mouth daily. 10/27/19  Yes Mullis, Kiersten P, DO  Fluticasone-Salmeterol (ADVAIR DISKUS) 250-50 MCG/DOSE AEPB Inhale 1 puff into the lungs 2 (two) times daily. 05/10/19  Yes Mullis, Kiersten P, DO  gabapentin (NEURONTIN) 300 MG capsule TAKE 1 CAPSULE (300 MG TOTAL) BY MOUTH 3 (THREE) TIMES DAILY. 07/08/20  Yes Mullis, Kiersten P, DO  HUMALOG KWIKPEN 100 UNIT/ML KwikPen INJECT 0-10 UNITS TOTAL INTO THE SKIN 3 TIMES DAILY WITH MEALS. Patient taking differently: Inject 0-10 Units into the skin 3 (three) times daily. Sliding scale 08/05/20  Yes Meccariello, Bernita Raisin, DO  ibuprofen (ADVIL) 200 MG tablet Take 800 mg by mouth every 8 (eight) hours as needed for headache or moderate pain.    Yes [provider]  Insulin Glargine (BASAGLAR KWIKPEN) 100 UNIT/ML INJECT 0.2 MLS (20 UNITS TOTAL) INTO THE SKIN IN THE MORNING. 05/01/20  Yes Mullis, Kiersten P, DO  metFORMIN (GLUCOPHAGE) 1000 MG tablet TAKE 1 TABLET (1,000 MG TOTAL) BY MOUTH 2 (TWO) TIMES DAILY WITH A MEAL. 06/11/20  Yes Mullis, Kiersten P, DO  mirtazapine (REMERON) 30 MG tablet Take 1 tablet (30 mg total) by mouth at bedtime. 09/20/19  Yes Mullis, Kiersten P, DO  montelukast (SINGULAIR) 10 MG tablet TAKE 1 TABLET (10 MG TOTAL) BY MOUTH AT BEDTIME. 08/05/20  Yes Meccariello, Bernita Raisin, DO  propranolol ER (INDERAL LA) 80 MG 24 hr capsule Take 1 capsule (80 mg total) by mouth daily. 08/19/20  Yes Mullis, Kiersten P, DO  sitaGLIPtin (JANUVIA) 100 MG tablet Take 1 tablet (100 mg total) by mouth daily. 09/20/19  Yes Mullis, Kiersten P, DO  ciprofloxacin (CIPRO) 500 MG tablet Take 500 mg by mouth 2 (two) times daily. Stat Date : 09/02/20    [provider]  Continuous Blood Gluc  Receiver (DEXCOM G6 RECEIVER) DEVI 1 Device by Does not apply route 4 (four) times daily. 03/15/20   Mullis, Kiersten P, DO  EASY TOUCH PEN NEEDLES 31G X 5 MM MISC Inject 1 application as directed 3 (three) times daily. 10/27/19   Mullis, Kiersten P, DO  FREESTYLE LITE test strip USE AS INSTRUCTED 05/14/20   Mina Marble P, DO  glucose blood test strip USE AS INSTRUCTED 05/15/20   [provider]  Lancets (FREESTYLE) lancets Use as instructed 03/29/18   Harriet Butte, DO  gabapentin (NEURONTIN) 300 MG capsule TAKE 1 CAPSULE (300 MG TOTAL) BY MOUTH 3 (THREE) TIMES DAILY. 04/08/20   Danna Hefty, DO    Social History   Socioeconomic History  . Marital  status: Married    Spouse name: Not on file  . Number of children: 1  . Years of education: Not on file  . Highest education level: Not on file  Occupational History  . Not on file  Tobacco Use  . Smoking status: Current Every Day Smoker    Packs/day: 1.00    Types: Cigarettes  . Smokeless tobacco: Never Used  Vaping Use  . Vaping Use: Never used  Substance and Sexual Activity  . Alcohol use: Not Currently    Comment: 2-3 per day, none since 2019  . Drug use: Not Currently  . Sexual activity: Not on file  Other Topics Concern  . Not on file  Social History Narrative  . Not on file   Social Determinants of Health   Financial Resource Strain: Not on file  Food Insecurity: Not on file  Transportation Needs: Not on file  Physical Activity: Not on file  Stress: Not on file  Social Connections: Not on file  Intimate Partner Violence: Not on file    Family History  Problem Relation Age of Onset  . Diabetes Mother   . Hypertension Mother   . Hyperlipidemia Mother   . Kidney disease Mother   . Thyroid disease Mother   . Breast cancer Mother        mets  . Lung cancer Mother   . Diabetes Father   . Alcohol abuse Sister   . Sickle cell trait Sister   . Diabetes Brother   . Asthma Brother   . Lung cancer  Maternal Grandmother   . Kidney disease Maternal Grandmother   . Liver cancer Maternal Uncle        x 4-5  . Kidney disease Maternal Uncle   . Kidney disease Paternal Uncle   . Colon cancer Neg Hx   . Colon polyps Neg Hx   . Esophageal cancer Neg Hx   . Rectal cancer Neg Hx   . Stomach cancer Neg Hx     ROS: Otherwise negative unless mentioned in HPI  Physical Examination  Vitals:   09/05/20 1215 09/05/20 1340  BP:  125/75  Pulse: 69 72  Resp: 11 17  Temp:  98.7 F (37.1 C)  SpO2: 96% 100%   There is no height or weight on file to calculate BMI.  General:  WDWN in NAD Gait: Not observed HENT: WNL, normocephalic Pulmonary: normal non-labored breathing Cardiac: regular rate and fevers Abdomen: soft, NT/ND, no masses Skin: without rashes Vascular Exam/Pulses: 2+ femoral pulses bilaterally, 2+ popliteal, Dp and PT pulses. Feet warm and well perfused. Right TMA healing with some central callus. Left  foot edematous. Left great toe with swelling and drainage. Ischemic changes to distal phalax with ulceration. Small plantar surface of 2nd toe with superficial eschar. Diminished sensation of plantar foot. Motor intact Musculoskeletal: no muscle wasting or atrophy  Neurologic: A&O X 3;  No focal weakness or paresthesias are detected; speech is fluent/normal Psychiatric:  The pt has Normal affect. Lymph:  Unremarkable  CBC    Component Value Date/Time   WBC 6.5 09/05/2020 0642   RBC 4.41 09/05/2020 0642   HGB 12.7 (L) 09/05/2020 0642   HGB 12.6 (L) 02/24/2018 1442   HCT 40.0 09/05/2020 0642   HCT 38.2 02/24/2018 1442   PLT 256 09/05/2020 0642   PLT 275 02/24/2018 1442   MCV 90.7 09/05/2020 0642   MCV 88 02/24/2018 1442   MCH 28.8 09/05/2020 0642   MCHC 31.8 09/05/2020 [id number]  RDW 14.4 09/05/2020 0642   RDW 13.8 02/24/2018 1442   LYMPHSABS 1.8 09/04/2020 1242   LYMPHSABS 1.8 02/24/2018 1442   MONOABS 0.7 09/04/2020 1242   EOSABS 0.2 09/04/2020 1242   EOSABS 0.1  02/24/2018 1442   BASOSABS 0.0 09/04/2020 1242   BASOSABS 0.0 02/24/2018 1442    BMET    Component Value Date/Time   NA 136 09/05/2020 0642   NA 139 03/12/2020 1508   K 4.6 09/05/2020 0642   CL 102 09/05/2020 0642   CO2 25 09/05/2020 0642   GLUCOSE 200 (H) 09/05/2020 0642   BUN 11 09/05/2020 0642   BUN 8 03/12/2020 1508   CREATININE 0.83 09/05/2020 0642   CALCIUM 8.7 (L) 09/05/2020 0642   GFRNONAA >60 09/05/2020 0642   GFRAA 116 03/12/2020 1508    COAGS: Lab Results  Component Value Date   INR 1.0 01/10/2020   INR 1.0 06/14/2019     Non-Invasive Vascular Imaging:  09/05/20 ABI shows triphasic waveforms bilaterally   MR Foot left w wo contrast: 09/05/20 1. Soft tissue ulceration along the plantar aspect of the distal great toe with a small underlying soft tissue abscess. 2. Total erosion of the distal 1st phalanx with marrow edema and enhancement compatible with osteomyelitis. 3. Fatty atrophy with T2 hyperintensity and enhancement within the abductor hallucis muscle, likely reflecting subacute denervation.  DG Foot Complete left 09/04/20: 1. Soft tissue wound of the distal medial first toe. Bone destruction of the medial tuft of the first distal phalanx concerning for osteomyelitis. 2. Mild osteoarthritis of the first MTP joint.  Statin:  Yes.   Beta Blocker:  Yes.   Aspirin:  No. ACEI:  No. ARB:  No. CCB use:  No Other antiplatelets/anticoagulants:  No.    ASSESSMENT/PLAN: This is a 49 y.o. male who presents with recent onset of left foot and great toe swelling with pain and drainage from left great toe over past 4 days. MRI and Xray of foot show osteomyelitis of distal 1st phalanx. His non invasive studies show triphasic waveforms and he has easily palpable pulses on examination. I do not have any concern from vascular standpoint about adequate perfusion of his lower extremities. I recommend amputation of the left great toe. He is currently on Vanc and  Zosyn. Afebrile and without leukocytosis. He was additionally seen in consultation by Orthopedics and per patient preference he would like vascular surgery to perform his surgery. Patient has eaten lunch today so so he will be posted for left great toe amputation tomorrow with Dr. Donzetta Matters. He will need to be NPO after midnight   Karoline Caldwell PA-C [facility] [phone] 09/05/2020  1:56 PM   I have seen and evaluated the patient. I agree with the PA note as documented above.  49 year old male with DM that vascular surgery has been consulted for osteomyelitis of left great toe.  Patient was admitted to the hospital yesterday and states he has had pus draining from the tip of the toe.  MRI shows small soft tissue abscess in the distal great toe with osteomyelitis.  Cellulitis has improved with IV antibiotics.  He has easily palpable dorsalis pedis pulse.  His ABIs are 1.34 with a triphasic waveform at the ankle.  I recommended a left great toe amputation.  He would prefer that vascular surgery do this given I have taken care of him before for his right TMA.  Unfortunately cannot do this today because patient just ate prior to my exam.  I have tentatively posted him tomorrow for left great toe amputation with Dr. Donzetta Matters.  I discussed there would be some risk that he would get canceled or delayed given other emergencies over the holiday weekend.  [name], MD [facility] Office: [phone]

## 2020-09-05 NOTE — Progress Notes (Addendum)
Family Medicine Teaching Service Daily Progress Note Intern Pager: [phone]  Patient name: Christopher Fernandez Medical record number: [id number] Date of birth: 12-17-1970 Age: 49 y.o. Gender: male  Primary Care Provider: Danna Hefty, DO Consultants: Ortho Code Status: DNR/DNI   Pt Overview and Major Events to Date:  12/22: admitted to [facility], MR with osteomyelitis of right root  Assessment and Plan: Christopher Fernandez is a 49 y.o. male presenting with cellulitis of his left great toe. 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  Osteomyelitis of left distal 1st phalanx  H/o amputation of midfoot and transmetatarsal of Right foot/poor wound heeling/MRSA infection MRI of left foot notable for soft tissue ulceration along the plantar aspect of the distal great toe with a small underlying soft tissue abscess and total erosion of the distal 1st phalanx with marrow edema and evidence of osteomyelitis. Currently on broad spectrum Unasyn and Vanc. Remains afebrile and hemodynamically stable. No leukocytosis. Gram stain of wound negative. Wound culture pending.  - ortho consulted, will follow up recs. Patient hesitant about amputation and would like to discuss alternative recommendations  - will reach out to VVS and/or podiatry to discuss options as well - continue IV Unasyn (12/22-) and Vanc (12/22-) at this time - Follow up wound culture - AM CBC, BMP - transition to Lovenox - carb modified diet - Tylenol 1g q6 hours with Ibuprofen 600mg  q6h PRN - LLE doppler negative for DVT  T2DM with neuropathy: chronic, stable CBG's range 175-200. Home meds: Jardiance 25 mg qd, metformin 1000 mg twice daily, sitagliptin 100 mg daily, Basaglar 20 units every morning and Humalog sliding scale 2 to 4  units 3 times daily.  Gabapentin 300 mg 3 times daily. - continue Jardiance 25mg  QD - hold home Metformin while inpatient - Continue Linagliptin (on formulary, substitution for home sitagliptin) - Lantaus 20U (on formulary, substitution for Basaglar) qAM - sSSI - cont home Gabapentin  Normocytic Anemia: Chronic, stable. Last iron panel in 2019 with normal iron, ferritin, iron sat. Likely anemia of chronic disease. Hgb stable and at baseline of 12.7 today. - daily CBC  Hyperlipidemia: Chronic, stable -Continue home atorvastatin 40mg  daily  Hx asthma: Chronic, stable Home medications include albuterol as needed, Singulair 10 mg - Dulera, per formulary - Albuterol PRN  Hx alcohol abuse  Tobacco use disorder Has been abstinent from alcohol for the last 10 months.  Currently smoking 1 pack/day, previous history of 1 to 2 packs/day for 10 to 12 years.  About a 25-pack-year smoker. Declines nicotine patch.  -Encourage smoking cessation  Hx Tremor: chronic, stable Recently started on 80 mg Propranolol ER by [name]. Patient without tremor on examination.  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 Not on prophylactic antibiotics. At risk for infections from encapsulated bacteria. Has had both COVID-19 vaccines, pneumococcal and Flu vaccine. -Appears that patient has not had Meningococal vaccine  Anxiety with Depression: Chronic, stable On aripiprazole 5 mg daily, Cymbalta 60 mg, mirtazapine 30 mg. - Continue home medications  FEN/GI: Heart healthy/carb modified Prophylaxis: Heparin  Disposition: med-surg  Subjective:  Doing well this AM. Notes foot is a little better. He is hesitant about amputation and would like to discuss  alternative options.   Objective: Temp:  [98 F (36.7 C)-98.3 F (36.8 C)] 98 F (36.7  C) (12/22 1543) Pulse Rate:  [62-79] 67 (12/23 0500) Resp:  [7-25] 14 (12/23 0500) BP: (115-141)/(68-98) 135/94 (12/23 0500) SpO2:  [95 %-100 %] 97 % (12/23 0500) Weight:  [120 kg] 120 kg (12/22 1026) Physical Exam: General: pleasant older man, sitting comfortably in bed side chair, well nourished, well developed, in no acute distress with non-toxic appearance Resp: breathing comfortably on room air, speaking in full sentences Skin: warm, dry, receding erythema from LE and foot Extremities: warm and well perfused, swelling improved in LLE, primarily 2-3+ edema in foot, wound to right great toe still evidence with drainage  Neuro: Alert and oriented, speech normal      Laboratory: Recent Labs  Lab 09/04/20 1242 09/05/20 0642  WBC 6.4 6.5  HGB 13.4 12.7*  HCT 42.8 40.0  PLT 252 256   Recent Labs  Lab 09/04/20 1242 09/05/20 0642  NA 137 136  K 4.2 4.6  CL 100 102  CO2 26 25  BUN 11 11  CREATININE 1.01 0.83  CALCIUM 9.7 8.7*  PROT 7.0  --   BILITOT 0.4  --   ALKPHOS 72  --   ALT 29  --   AST 18  --   GLUCOSE 145* 200*    LA: 1.1 COVID/Flu neg Wound Cx: gram stain neg, culture pending  Urinalysis    Component Value Date/Time   COLORURINE YELLOW 09/04/2020 2128   APPEARANCEUR CLEAR 09/04/2020 2128   LABSPEC 1.035 (H) 09/04/2020 2128   PHURINE 5.0 09/04/2020 2128   GLUCOSEU >=500 (A) 09/04/2020 2128   HGBUR NEGATIVE 09/04/2020 2128   BILIRUBINUR NEGATIVE 09/04/2020 2128   KETONESUR NEGATIVE 09/04/2020 2128   PROTEINUR NEGATIVE 09/04/2020 2128   NITRITE NEGATIVE 09/04/2020 2128   LEUKOCYTESUR NEGATIVE 09/04/2020 2128    Imaging/Diagnostic Tests: MR FOOT LEFT W WO CONTRAST  Result Date: 09/05/2020 CLINICAL DATA:  Possible foot infection.  Osteomyelitis, foot. EXAM: MRI OF THE LEFT FOREFOOT WITHOUT AND WITH CONTRAST TECHNIQUE: Multiplanar, multisequence MR imaging of the left foot was performed both before and after administration of intravenous contrast.  CONTRAST:  93mL GADAVIST GADOBUTROL 1 MMOL/ML IV SOLN COMPARISON:  Radiographs 09/04/2020 FINDINGS: Bones/Joint/Cartilage This examination includes most of the left foot. As demonstrated radiographically, there is tuftal erosion of the distal 1st phalanx with associated marrow T2 hyperintensity and enhancement compatible with osteomyelitis. There is no interphalangeal joint effusion or abnormality of the proximal 1st phalanx. The additional toes appear unremarkable. There are degenerative changes in the midfoot with scattered subchondral cysts and osteophytes in the cuneiform bones. Mild degenerative changes are also present at the 1st metatarsophalangeal joint. No significant joint effusions or abnormal synovial enhancement. Ligaments The Lisfranc ligament is intact. The collateral ligaments of the metatarsophalangeal joints appear intact. Muscles and Tendons There is fatty atrophy with T2 hyperintensity and enhancement within the abductor hallucis muscle. No overlying skin defect or focal fluid collection is seen, and this may reflect subacute denervation. The foot muscles and tendons otherwise appear unremarkable. No significant tenosynovitis. Soft tissues Suggested skin ulceration along the plantar aspect of the distal great toe with an underlying small peripherally enhancing fluid collection in the subcutaneous fat, measuring up to 9 mm on image 80/10. There is generalized edema and enhancement throughout the subcutaneous tissues of the great toe. No other focal fluid collections are seen. IMPRESSION: 1. Soft tissue ulceration along the plantar aspect of the distal great  toe with a small underlying soft tissue abscess. 2. Total erosion of the distal 1st phalanx with marrow edema and enhancement compatible with osteomyelitis. 3. Fatty atrophy with T2 hyperintensity and enhancement within the abductor hallucis muscle, likely reflecting subacute denervation. Electronically Signed   By: Richardean Sale M.D.   On:  09/05/2020 07:55   DG Foot Complete Left  Result Date: 09/04/2020 CLINICAL DATA:  Evaluate for infection of the great toe. Diabetic foot ulcer. EXAM: LEFT FOOT - COMPLETE 3+ VIEW COMPARISON:  None. FINDINGS: Soft tissue wound of the distal medial first toe. Bone destruction of the medial tuft of the first distal phalanx concerning for osteomyelitis. No acute fracture or dislocation. Mild osteoarthritis of the first MTP joint. IMPRESSION: 1. Soft tissue wound of the distal medial first toe. Bone destruction of the medial tuft of the first distal phalanx concerning for osteomyelitis. 2. Mild osteoarthritis of the first MTP joint. Electronically Signed   By: Kathreen Devoid   On: 09/04/2020 12:54   Danna Hefty, DO 09/05/2020, 7:59 AM PGY-3, [facility] Intern pager: [phone], text pages welcome

## 2020-09-05 NOTE — Progress Notes (Signed)
Left leg venous and ABI completed.   Please see CV Proc for preliminary results.   Vonzell Schlatter, RVT

## 2020-09-05 NOTE — Hospital Course (Addendum)
Christopher Fernandez is a 49 y.o. male presenting with cellulitis of his left great toe. 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  Patient had failed 48 hours of p.o. antibiotics prior to admission.  Admission labs unremarkable, CBC, BMP and lactic acid WNL. Left foot x-ray significant for soft tissue wound of distal medial first toe and bone destruction of the medial tuft of the first distal phalanx, concerning for osteomyelitis.  MRI of left foot showed soft tissue ulceration along the plantar aspect of the distal great toe with a small underlying soft tissue abscess and total erosion of the distal 1st phalanx with marrow edema and evidence of osteomyelitis. Patient treated with IV Zosyn and vancomycin.  Due to swelling of foot, left lower extremity Doppler was ordered to rule out DVT which were negative. Bilateral ABI's noted noncompressable arteries bilaterally. Vascular surgery performed left great toe amputation on 16/10 without complication. Patient was monitored overnight and discharged on 12/25 with Percocet for pain control. No antibiotics were continued upon discharge. Patient remained afebrile and hemodynamically stable throughout admission. He was discharged home with recommendation to follow up closely with PCP and VVS.    Other chronic conditions stable.

## 2020-09-05 NOTE — Progress Notes (Signed)
ORTHOPAEDIC CONSULTATION  REQUESTING PHYSICIAN: Zenia Resides, MD  Chief Complaint: "my left great toe hurts"  HPI: Christopher Fernandez is a 49 y.o. male who presents with left great toe pain.  Patient describes onset of pain on Sunday with associated fevers and chills on Sunday Monday.  These systemic symptoms have resolved as of Tuesday but he continues to have left great toe pain and swelling with discharge.  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 Sharol Given with revision by Dr. Carlis Abbott.  Most recent amputation on the right foot was right TMA revision on 01/10/2020.  He denies any current fevers, chills, night sweats.  He reports history of diabetes with last A1c of 7.9.  Past Medical History:  Diagnosis Date  . Alcoholism ([place])   . Anemia   . Anxiety   . Arthritis   . Asthma   . COPD (chronic obstructive pulmonary disease) ([place])   . Depression   . Diabetes ([place])    type 2  . GERD (gastroesophageal reflux disease)   . History of hiatal hernia   . HTN (hypertension)   . Osteomyelitis ([place])    right forefoot  . Pancreatitis   . Pneumonia   . Wears glasses    Past Surgical History:  Procedure Laterality Date  . 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 AMPUTATION Right 10/18/2019   Procedure: RIGHT SECOND TOE AMPUTATION;  Surgeon: Newt Minion, MD;  Location: [place];  Service: Orthopedics;  Laterality: Right;  . AMPUTATION Right 11/08/2019   Procedure: RIGHT TRANSMETATARSAL AMPUTATION;  Surgeon: Newt Minion, MD;  Location: [place];  Service: Orthopedics;  Laterality: Right;  . APPENDECTOMY    . APPLICATION OF WOUND VAC Right 01/10/2020   Procedure: APPLICATION OF WOUND VAC, right foot;  Surgeon: [name], MD;  Location: [place];  Service: Vascular;  Laterality: Right;  . CHOLECYSTECTOMY  2006   with gallbladder and spleen  . COLONOSCOPY  8-10 years ago    in [place] exam per pt  . HERNIA REPAIR  2008, 2010   hiatal hernia and 1  additional  . NASAL SEPTUM SURGERY    . PANCREATIC PSEUDOCYST DRAINAGE    . SPLENECTOMY  2006  . STUMP REVISION Right 11/24/2019   Procedure: REVISION RIGHT TRANSMETATARSAL AMPUTATION;  Surgeon: Newt Minion, MD;  Location: [place];  Service: Orthopedics;  Laterality: Right;  . TOTAL KNEE ARTHROPLASTY Right 1982, 1984  . WOUND DEBRIDEMENT Right 01/10/2020   Procedure: incisional DEBRIDEMENT of RIGHT TRANSMETATARSAL WOUND;  Surgeon: [name], MD;  Location: [facility] OR;  Service: Vascular;  Laterality: Right;   Social History   Socioeconomic History  . Marital status: Married    Spouse name: Not on file  . Number of children: 1  . Years of education: Not on file  . Highest education level: Not on file  Occupational History  . Not on file  Tobacco Use  . Smoking status: Current Every Day Smoker    Packs/day: 1.00    Types: Cigarettes  . Smokeless tobacco: Never Used  Vaping Use  . Vaping Use: Never used  Substance and Sexual Activity  . Alcohol use: Not Currently    Comment: 2-3 per day, none since 2019  . Drug use: Not Currently  . Sexual activity: Not on file  Other Topics Concern  . Not on file  Social History Narrative  . Not on file   Social  Determinants of Health   Financial Resource Strain: Not on file  Food Insecurity: Not on file  Transportation Needs: Not on file  Physical Activity: Not on file  Stress: Not on file  Social Connections: Not on file   Family History  Problem Relation Age of Onset  . Diabetes Mother   . Hypertension Mother   . Hyperlipidemia Mother   . Kidney disease Mother   . Thyroid disease Mother   . Breast cancer Mother        mets  . Lung cancer Mother   . Diabetes Father   . Alcohol abuse Sister   . Sickle cell trait Sister   . Diabetes Brother   . Asthma Brother   . Lung cancer Maternal Grandmother   . Kidney disease Maternal Grandmother   . Liver cancer Maternal Uncle        x 4-5  . Kidney disease Maternal Uncle   .  Kidney disease Paternal Uncle   . Colon cancer Neg Hx   . Colon polyps Neg Hx   . Esophageal cancer Neg Hx   . Rectal cancer Neg Hx   . Stomach cancer Neg Hx    - negative except otherwise stated in the family history section Allergies  Allergen Reactions  . Eggs Or Egg-Derived Products Rash  . Morphine And Related Other (See Comments)    Cant take because of pancreatitis  . Cocoa Rash   Prior to Admission medications   Medication Sig Start Date End Date Taking? Authorizing Provider  albuterol (VENTOLIN HFA) 108 (90 Base) MCG/ACT inhaler Inhale 2-4 puffs into the lungs every 4 (four) hours as needed for wheezing (or cough). Patient taking differently: Inhale 2 puffs into the lungs every 4 (four) hours as needed for wheezing. 09/20/19  Yes Mullis, Kiersten P, DO  ARIPiprazole (ABILIFY) 5 MG tablet TAKE 1 TABLET (5 MG TOTAL) BY MOUTH DAILY. 08/05/20  Yes Meccariello, Bernita Raisin, DO  atorvastatin (LIPITOR) 40 MG tablet Take 1 tablet (40 mg total) by mouth daily. 12/12/19  Yes Mullis, Kiersten P, DO  bismuth subsalicylate (PEPTO BISMOL) 262 MG/15ML suspension Take 30 mLs by mouth every 6 (six) hours as needed for indigestion or diarrhea or loose stools.   Yes [provider]  diphenhydrAMINE (BENADRYL) 25 MG tablet Take 25 mg by mouth every 6 (six) hours as needed for itching or allergies.    Yes [provider]  doxycycline (VIBRAMYCIN) 100 MG capsule Take 100 mg by mouth 2 (two) times daily. Start date : 09/02/20   Yes [provider]  DULoxetine (CYMBALTA) 60 MG capsule TAKE 1 CAPSULE (60 MG TOTAL) BY MOUTH DAILY. 08/05/20  Yes Meccariello, Bernita Raisin, DO  empagliflozin (JARDIANCE) 25 MG TABS tablet Take 25 mg by mouth daily. 10/27/19  Yes Mullis, Kiersten P, DO  Fluticasone-Salmeterol (ADVAIR DISKUS) 250-50 MCG/DOSE AEPB Inhale 1 puff into the lungs 2 (two) times daily. 05/10/19  Yes Mullis, Kiersten P, DO  gabapentin (NEURONTIN) 300 MG capsule TAKE 1 CAPSULE (300 MG  TOTAL) BY MOUTH 3 (THREE) TIMES DAILY. 07/08/20  Yes Mullis, Kiersten P, DO  HUMALOG KWIKPEN 100 UNIT/ML KwikPen INJECT 0-10 UNITS TOTAL INTO THE SKIN 3 TIMES DAILY WITH MEALS. Patient taking differently: Inject 0-10 Units into the skin 3 (three) times daily. Sliding scale 08/05/20  Yes Meccariello, Bernita Raisin, DO  ibuprofen (ADVIL) 200 MG tablet Take 800 mg by mouth every 8 (eight) hours as needed for headache or moderate pain.    Yes [provider]  Insulin Glargine (BASAGLAR KWIKPEN) 100 UNIT/ML INJECT 0.2 MLS (20 UNITS TOTAL) INTO THE SKIN IN THE MORNING. 05/01/20  Yes Mullis, Kiersten P, DO  metFORMIN (GLUCOPHAGE) 1000 MG tablet TAKE 1 TABLET (1,000 MG TOTAL) BY MOUTH 2 (TWO) TIMES DAILY WITH A MEAL. 06/11/20  Yes Mullis, Kiersten P, DO  mirtazapine (REMERON) 30 MG tablet Take 1 tablet (30 mg total) by mouth at bedtime. 09/20/19  Yes Mullis, Kiersten P, DO  montelukast (SINGULAIR) 10 MG tablet TAKE 1 TABLET (10 MG TOTAL) BY MOUTH AT BEDTIME. 08/05/20  Yes Meccariello, Solmon Ice, DO  propranolol ER (INDERAL LA) 80 MG 24 hr capsule Take 1 capsule (80 mg total) by mouth daily. 08/19/20  Yes Mullis, Kiersten P, DO  sitaGLIPtin (JANUVIA) 100 MG tablet Take 1 tablet (100 mg total) by mouth daily. 09/20/19  Yes Mullis, Kiersten P, DO  ciprofloxacin (CIPRO) 500 MG tablet Take 500 mg by mouth 2 (two) times daily. Stat Date : 09/02/20    [provider]  Continuous Blood Gluc Receiver (DEXCOM G6 RECEIVER) DEVI 1 Device by Does not apply route 4 (four) times daily. 03/15/20   Mullis, Kiersten P, DO  EASY TOUCH PEN NEEDLES 31G X 5 MM MISC Inject 1 application as directed 3 (three) times daily. 10/27/19   Mullis, Kiersten P, DO  FREESTYLE LITE test strip USE AS INSTRUCTED 05/14/20   Orpah Cobb P, DO  glucose blood test strip USE AS INSTRUCTED 05/15/20   [provider]  Lancets (FREESTYLE) lancets Use as instructed 03/29/18   Durward Parcel, DO  gabapentin (NEURONTIN) 300 MG capsule TAKE 1  CAPSULE (300 MG TOTAL) BY MOUTH 3 (THREE) TIMES DAILY. 04/08/20   Joana Reamer, DO   MR FOOT LEFT W WO CONTRAST  Result Date: 09/05/2020 CLINICAL DATA:  Possible foot infection.  Osteomyelitis, foot. EXAM: MRI OF THE LEFT FOREFOOT WITHOUT AND WITH CONTRAST TECHNIQUE: Multiplanar, multisequence MR imaging of the left foot was performed both before and after administration of intravenous contrast. CONTRAST:  68mL GADAVIST GADOBUTROL 1 MMOL/ML IV SOLN COMPARISON:  Radiographs 09/04/2020 FINDINGS: Bones/Joint/Cartilage This examination includes most of the left foot. As demonstrated radiographically, there is tuftal erosion of the distal 1st phalanx with associated marrow T2 hyperintensity and enhancement compatible with osteomyelitis. There is no interphalangeal joint effusion or abnormality of the proximal 1st phalanx. The additional toes appear unremarkable. There are degenerative changes in the midfoot with scattered subchondral cysts and osteophytes in the cuneiform bones. Mild degenerative changes are also present at the 1st metatarsophalangeal joint. No significant joint effusions or abnormal synovial enhancement. Ligaments The Lisfranc ligament is intact. The collateral ligaments of the metatarsophalangeal joints appear intact. Muscles and Tendons There is fatty atrophy with T2 hyperintensity and enhancement within the abductor hallucis muscle. No overlying skin defect or focal fluid collection is seen, and this may reflect subacute denervation. The foot muscles and tendons otherwise appear unremarkable. No significant tenosynovitis. Soft tissues Suggested skin ulceration along the plantar aspect of the distal great toe with an underlying small peripherally enhancing fluid collection in the subcutaneous fat, measuring up to 9 mm on image 80/10. There is generalized edema and enhancement throughout the subcutaneous tissues of the great toe. No other focal fluid collections are seen. IMPRESSION: 1. Soft  tissue ulceration along the plantar aspect of the distal great toe with a small underlying soft tissue abscess. 2. Total erosion of the distal 1st phalanx with marrow edema and enhancement compatible with osteomyelitis. 3. Fatty atrophy  with T2 hyperintensity and enhancement within the abductor hallucis muscle, likely reflecting subacute denervation. Electronically Signed   By: Carey BullocksWilliam  Veazey M.D.   On: 09/05/2020 07:55   DG Foot Complete Left  Result Date: 09/04/2020 CLINICAL DATA:  Evaluate for infection of the great toe. Diabetic foot ulcer. EXAM: LEFT FOOT - COMPLETE 3+ VIEW COMPARISON:  None. FINDINGS: Soft tissue wound of the distal medial first toe. Bone destruction of the medial tuft of the first distal phalanx concerning for osteomyelitis. No acute fracture or dislocation. Mild osteoarthritis of the first MTP joint. IMPRESSION: 1. Soft tissue wound of the distal medial first toe. Bone destruction of the medial tuft of the first distal phalanx concerning for osteomyelitis. 2. Mild osteoarthritis of the first MTP joint. Electronically Signed   By: Elige KoHetal  Patel   On: 09/04/2020 12:54   VAS US ABI WITH/WO TBI  Result Date: 09/05/2020 LOWER EXTREMITY DOPPLER STUDY Indications: Rest pain. High Risk Factors: Hypertension, hyperlipidemia, Diabetes, current smoker. Other Factors: Hx of right TM amputation and osteomyelitis.  Performing Technologist: Clint GuyGibson, Lisa RVT  Examination Guidelines: A complete evaluation includes at minimum, Doppler waveform signals and systolic blood pressure reading at the level of bilateral brachial, anterior tibial, and posterior tibial arteries, when vessel segments are accessible. Bilateral testing is considered an integral part of a complete examination. Photoelectric Plethysmograph (PPG) waveforms and toe systolic pressure readings are included as required and additional duplex testing as needed. Limited examinations for reoccurring indications may be performed as noted.   ABI Findings: +--------+------------------+-----+---------+--------+ Right   Rt Pressure (mmHg)IndexWaveform Comment  +--------+------------------+-----+---------+--------+ Brachial120                    triphasic         +--------+------------------+-----+---------+--------+ PTA     167               1.39 triphasic         +--------+------------------+-----+---------+--------+ DP      168               1.40 triphasic         +--------+------------------+-----+---------+--------+ +---------+------------------+-----+---------+---------------------------------+ Left     Lt Pressure (mmHg)IndexWaveform Comment                           +---------+------------------+-----+---------+---------------------------------+ Brachial 120                    triphasic                                  +---------+------------------+-----+---------+---------------------------------+ PTA      164               1.37 triphasic                                  +---------+------------------+-----+---------+---------------------------------+ DP       161               1.34 triphasic                                  +---------+------------------+-----+---------+---------------------------------+ Great Toe                       Normal  Unable to get a pressure due to                                            toe circumference                 +---------+------------------+-----+---------+---------------------------------+ TOES Findings:  Right transmet amputation +---------+---------------+--------+-------+ Left ToesPressure (mmHg)WaveformComment +---------+---------------+--------+-------+ 1st Digit               Normal          +---------+---------------+--------+-------+   Arterial wall calcification precludes accurate ankle pressures and ABIs.  Summary: Right: Resting right ankle-brachial index indicates noncompressible right lower extremity arteries. Left: Resting  left ankle-brachial index indicates noncompressible left lower extremity arteries.  *See table(s) above for measurements and observations.     Preliminary    VAS [place] LOWER EXTREMITY VENOUS (DVT)  Result Date: 09/05/2020  Lower Venous DVT Study Indications: Edema.  Performing Technologist: Vonzell Schlatter RVT  Examination Guidelines: A complete evaluation includes B-mode imaging, spectral Doppler, color Doppler, and power Doppler as needed of all accessible portions of each vessel. Bilateral testing is considered an integral part of a complete examination. Limited examinations for reoccurring indications may be performed as noted. The reflux portion of the exam is performed with the patient in reverse Trendelenburg.  +-----+---------------+---------+-----------+----------+--------------+ RIGHTCompressibilityPhasicitySpontaneityPropertiesThrombus Aging +-----+---------------+---------+-----------+----------+--------------+ CFV  Full           Yes      Yes                                 +-----+---------------+---------+-----------+----------+--------------+   +---------+---------------+---------+-----------+----------+--------------+ LEFT     CompressibilityPhasicitySpontaneityPropertiesThrombus Aging +---------+---------------+---------+-----------+----------+--------------+ CFV      Full           Yes      Yes                                 +---------+---------------+---------+-----------+----------+--------------+ SFJ      Full                                                        +---------+---------------+---------+-----------+----------+--------------+ FV Prox  Full                                                        +---------+---------------+---------+-----------+----------+--------------+ FV Mid   Full                                                        +---------+---------------+---------+-----------+----------+--------------+ FV DistalFull                                                         +---------+---------------+---------+-----------+----------+--------------+  PFV      Full                                                        +---------+---------------+---------+-----------+----------+--------------+ POP      Full                                                        +---------+---------------+---------+-----------+----------+--------------+ PTV      Full           Yes      Yes                                 +---------+---------------+---------+-----------+----------+--------------+ PERO     Full                                                        +---------+---------------+---------+-----------+----------+--------------+     Summary: RIGHT: - No evidence of deep vein thrombosis in the lower extremity. No indirect evidence of obstruction proximal to the inguinal ligament.  LEFT: - There is no evidence of deep vein thrombosis in the lower extremity.  - No cystic structure found in the popliteal fossa.  *See table(s) above for measurements and observations.    Preliminary    - pertinent xrays, CT, MRI studies were reviewed and independently interpreted  Positive ROS: All other systems have been reviewed and were otherwise negative with the exception of those mentioned in the HPI and as above.  Physical Exam: General: Alert, no acute distress Psychiatric: Patient is competent for consent with normal mood and affect Lymphatic: No axillary or cervical lymphadenopathy Cardiovascular: No pedal edema Respiratory: No cyanosis, no use of accessory musculature GI: No organomegaly, abdomen is soft and non-tender    Images:  @ENCIMAGES @  Labs:  Lab Results  Component Value Date   HGBA1C 7.9 (A) 08/19/2020   HGBA1C 8.2 (A) 03/12/2020   HGBA1C 8.4 (H) 01/10/2020   ESRSEDRATE 35 (H) 01/12/2020   CRP 0.6 01/12/2020   REPTSTATUS PENDING 09/04/2020   GRAMSTAIN NO WBC SEEN NO ORGANISMS SEEN  09/04/2020    CULT  09/04/2020    CULTURE REINCUBATED FOR BETTER GROWTH Performed at [facility], [facility] [address], [place], [place] [address]    LABORGA SERRATIA MARCESCENS 11/24/2019   LABORGA METHICILLIN RESISTANT STAPHYLOCOCCUS AUREUS 11/24/2019    Lab Results  Component Value Date   ALBUMIN 3.7 09/04/2020   ALBUMIN 4.9 08/19/2020   ALBUMIN 4.7 03/12/2020    Neurologic: Patient does not have protective sensation bilateral lower extremities.   MUSCULOSKELETAL:   Ortho exam demonstrates left foot with diffuse pitting edema that is not present on the right foot.  Swelling and drainage that is expressible from the left great toe especially.  Tenderness throughout the left great toe, most severe at the distal plantar aspect.  Multiple ulcerations noted.  1+ DP pulse of the left lower extremity.  Transmetatarsal amputation of the right  foot is noted.  No other wounds are noted in the other webspaces or along the other toes.  Assessment: Left great toe osteomyelitis.  Plan: MRI reviewed and discussed patient with Dr. Marlou Sa and Dr. Carlis Abbott.  Plan for left great toe amputation tomorrow morning by vascular surgery.  No sign of sepsis at this time.  Asked nurse to hold Lovenox with surgery likely for 7:30 AM tomorrow.  Patient has just eaten lunch so surgery this afternoon is not feasible.  Thank you for the consult and the opportunity to see Mr. Brendyn, Fernandez [facility] [phone] 2:32 PM

## 2020-09-05 NOTE — ED Notes (Signed)
Lunch Tray Ordered @ 1046. 

## 2020-09-06 ENCOUNTER — Other Ambulatory Visit (HOSPITAL_COMMUNITY): Payer: Self-pay | Admitting: Vascular Surgery

## 2020-09-06 ENCOUNTER — Inpatient Hospital Stay (HOSPITAL_COMMUNITY): Payer: 59 | Admitting: Certified Registered Nurse Anesthetist

## 2020-09-06 ENCOUNTER — Encounter (HOSPITAL_COMMUNITY)
Admission: EM | Disposition: A | Discharge status: Home / Self Care | Payer: Self-pay | Source: Home / Self Care | Attending: Family Medicine

## 2020-09-06 ENCOUNTER — Encounter (HOSPITAL_COMMUNITY): Payer: Self-pay | Admitting: Family Medicine

## 2020-09-06 DIAGNOSIS — R6889 Other general symptoms and signs: Secondary | ICD-10-CM

## 2020-09-06 DIAGNOSIS — I96 Gangrene, not elsewhere classified: Secondary | ICD-10-CM

## 2020-09-06 HISTORY — PX: TRANSMETATARSAL AMPUTATION: SHX6197

## 2020-09-06 LAB — CBC
HCT: 37 % — ABNORMAL LOW (ref 39.0–52.0)
Hemoglobin: 12 g/dL — ABNORMAL LOW (ref 13.0–17.0)
MCH: 28.4 pg (ref 26.0–34.0)
MCHC: 32.4 g/dL (ref 30.0–36.0)
MCV: 87.5 fL (ref 80.0–100.0)
Platelets: 279 10*3/uL (ref 150–400)
RBC: 4.23 MIL/uL (ref 4.22–5.81)
RDW: 13.9 % (ref 11.5–15.5)
WBC: 7.1 10*3/uL (ref 4.0–10.5)
nRBC: 0 % (ref 0.0–0.2)

## 2020-09-06 LAB — BASIC METABOLIC PANEL WITH GFR
Anion gap: 7 (ref 5–15)
Anion gap: 7 (ref 5–15)
BUN: 12 mg/dL (ref 6–20)
BUN: 9 mg/dL (ref 6–20)
CO2: 26 mmol/L (ref 22–32)
CO2: 28 mmol/L (ref 22–32)
Calcium: 8.4 mg/dL — ABNORMAL LOW (ref 8.9–10.3)
Calcium: 8.8 mg/dL — ABNORMAL LOW (ref 8.9–10.3)
Chloride: 102 mmol/L (ref 98–111)
Chloride: 102 mmol/L (ref 98–111)
Creatinine, Ser: 0.84 mg/dL (ref 0.61–1.24)
Creatinine, Ser: 0.8 mg/dL (ref 0.61–1.24)
GFR, Estimated: >60 mL/min
GFR, Estimated: >60 mL/min
Glucose, Bld: 231 mg/dL — ABNORMAL HIGH (ref 70–99)
Glucose, Bld: 113 mg/dL — ABNORMAL HIGH (ref 70–99)
Potassium: 4 mmol/L (ref 3.5–5.1)
Potassium: 4.7 mmol/L (ref 3.5–5.1)
Sodium: 135 mmol/L (ref 135–145)
Sodium: 137 mmol/L (ref 135–145)

## 2020-09-06 LAB — GLUCOSE, CAPILLARY
Glucose-Capillary: 151 mg/dL — ABNORMAL HIGH (ref 70–99)
Glucose-Capillary: 101 mg/dL — ABNORMAL HIGH (ref 70–99)
Glucose-Capillary: 407 mg/dL — ABNORMAL HIGH (ref 70–99)
Glucose-Capillary: 316 mg/dL — ABNORMAL HIGH (ref 70–99)

## 2020-09-06 LAB — CREATININE, SERUM
Creatinine, Ser: 0.93 mg/dL (ref 0.61–1.24)
GFR, Estimated: >60 mL/min (ref >60)

## 2020-09-06 LAB — SURGICAL PCR SCREEN
MRSA, PCR: POSITIVE — AB
Staphylococcus aureus: POSITIVE — AB

## 2020-09-06 SURGERY — AMPUTATION, FOOT, TRANSMETATARSAL
Anesthesia: Monitor Anesthesia Care | Site: Toe | Laterality: Left

## 2020-09-06 MED ORDER — 0.9 % SODIUM CHLORIDE (POUR BTL) OPTIME
TOPICAL | Status: DC | PRN
  Administered 2020-09-06: 1000 mL

## 2020-09-06 MED ORDER — DEXAMETHASONE SODIUM PHOSPHATE 10 MG/ML IJ SOLN
INTRAMUSCULAR | Status: DC | PRN
  Administered 2020-09-06: 5 mg

## 2020-09-06 MED ORDER — ROPIVACAINE HCL 7.5 MG/ML IJ SOLN
INTRAMUSCULAR | Status: DC | PRN
  Administered 2020-09-06: 20 mL via PERINEURAL

## 2020-09-06 MED ORDER — LIDOCAINE 2% (20 MG/ML) 5 ML SYRINGE
INTRAMUSCULAR | Status: DC | PRN
  Administered 2020-09-06: 20 mg via INTRAVENOUS

## 2020-09-06 MED ORDER — FENTANYL CITRATE (PF) 100 MCG/2ML IJ SOLN
INTRAMUSCULAR | Status: AC
  Filled 2020-09-06: qty 2

## 2020-09-06 MED ORDER — PROPOFOL 10 MG/ML IV BOLUS
INTRAVENOUS | Status: DC | PRN
  Administered 2020-09-06: 50 mg via INTRAVENOUS
  Administered 2020-09-06: 30 mg via INTRAVENOUS
  Administered 2020-09-06: 50 mg via INTRAVENOUS

## 2020-09-06 MED ORDER — FENTANYL CITRATE (PF) 100 MCG/2ML IJ SOLN: 50.0000 ug | Freq: Once | INTRAMUSCULAR | Status: AC

## 2020-09-06 MED ORDER — FENTANYL CITRATE (PF) 100 MCG/2ML IJ SOLN
25.0000 ug | INTRAMUSCULAR | Status: DC | PRN
  Administered 2020-09-06 (×2): 50 ug via INTRAVENOUS

## 2020-09-06 MED ORDER — LACTATED RINGERS IV SOLN: INTRAVENOUS | Status: DC

## 2020-09-06 MED ORDER — PROPOFOL 500 MG/50ML IV EMUL
INTRAVENOUS | Status: DC | PRN
  Administered 2020-09-06: 100 ug/kg/min via INTRAVENOUS

## 2020-09-06 MED ORDER — CHLORHEXIDINE GLUCONATE CLOTH 2 % EX PADS
6.0000 | MEDICATED_PAD | Freq: Every day | CUTANEOUS | Status: DC
  Administered 2020-09-07: 6 via TOPICAL

## 2020-09-06 MED ORDER — OXYCODONE-ACETAMINOPHEN 5-325 MG PO TABS: 1.0000 | ORAL_TABLET | ORAL | 0 refills | Status: DC | PRN

## 2020-09-06 MED ORDER — ONDANSETRON HCL 4 MG/2ML IJ SOLN: 4.0000 mg | Freq: Once | INTRAMUSCULAR | Status: DC | PRN

## 2020-09-06 MED ORDER — DEXAMETHASONE SODIUM PHOSPHATE 10 MG/ML IJ SOLN
INTRAMUSCULAR | Status: DC | PRN
  Administered 2020-09-06: 5 mg via INTRAVENOUS

## 2020-09-06 MED ORDER — MIDAZOLAM HCL 2 MG/2ML IJ SOLN
INTRAMUSCULAR | Status: AC
  Administered 2020-09-06: 2 mg via INTRAVENOUS
  Filled 2020-09-06: qty 2

## 2020-09-06 MED ORDER — MIDAZOLAM HCL 2 MG/2ML IJ SOLN: 2.0000 mg | Freq: Once | INTRAMUSCULAR | Status: AC

## 2020-09-06 MED ORDER — ENOXAPARIN SODIUM 40 MG/0.4ML ~~LOC~~ SOLN
40.0000 mg | SUBCUTANEOUS | Status: DC
Start: 2020-09-06 — End: 2020-09-06

## 2020-09-06 MED ORDER — CHLORHEXIDINE GLUCONATE 0.12 % MT SOLN
15.0000 mL | Freq: Once | OROMUCOSAL | Status: AC
  Administered 2020-09-06: 15 mL via OROMUCOSAL

## 2020-09-06 MED ORDER — LIDOCAINE HCL (PF) 1 % IJ SOLN
INTRAMUSCULAR | Status: AC
  Filled 2020-09-06: qty 30

## 2020-09-06 MED ORDER — PHENYLEPHRINE HCL-NACL 10-0.9 MG/250ML-% IV SOLN
INTRAVENOUS | Status: DC | PRN
  Administered 2020-09-06: 40 ug/min via INTRAVENOUS

## 2020-09-06 MED ORDER — FENTANYL CITRATE (PF) 100 MCG/2ML IJ SOLN
INTRAMUSCULAR | Status: AC
  Administered 2020-09-06: 50 ug via INTRAVENOUS
  Filled 2020-09-06: qty 2

## 2020-09-06 MED ORDER — ONDANSETRON HCL 4 MG/2ML IJ SOLN
INTRAMUSCULAR | Status: DC | PRN
  Administered 2020-09-06: 4 mg via INTRAVENOUS

## 2020-09-06 MED ORDER — ORAL CARE MOUTH RINSE: 15.0000 mL | Freq: Once | OROMUCOSAL | Status: AC

## 2020-09-06 MED ORDER — CHLORHEXIDINE GLUCONATE 0.12 % MT SOLN
OROMUCOSAL | Status: AC
  Filled 2020-09-06: qty 15

## 2020-09-06 MED ORDER — MUPIROCIN 2 % EX OINT
1.0000 "application " | TOPICAL_OINTMENT | Freq: Two times a day (BID) | CUTANEOUS | Status: DC
  Administered 2020-09-06 – 2020-09-07 (×4): 1 via NASAL
  Filled 2020-09-06: qty 22

## 2020-09-06 MED FILL — OXYCODONE-APAP 5-325MG: 5-325 | 5 days supply | Qty: 30 | Fill #0

## 2020-09-06 SURGICAL SUPPLY — 35 items
BLADE AVERAGE 25X9 (BLADE) ×2 IMPLANT
BLADE SAW SGTL 81X20 HD (BLADE) ×1 IMPLANT
BNDG CONFORM 3 STRL LF (GAUZE/BANDAGES/DRESSINGS) IMPLANT
BNDG ELASTIC 4X5.8 VLCR STR LF (GAUZE/BANDAGES/DRESSINGS) ×2 IMPLANT
BNDG GAUZE ELAST 4 BULKY (GAUZE/BANDAGES/DRESSINGS) ×2 IMPLANT
CANISTER SUCT 3000ML PPV (MISCELLANEOUS) ×2 IMPLANT
COVER SURGICAL LIGHT HANDLE (MISCELLANEOUS) ×2 IMPLANT
COVER WAND RF STERILE (DRAPES) ×2 IMPLANT
DRAPE EXTREMITY T 121X128X90 (DISPOSABLE) ×1 IMPLANT
DRAPE HALF SHEET 40X57 (DRAPES) ×2 IMPLANT
DRSG EMULSION OIL 3X3 NADH (GAUZE/BANDAGES/DRESSINGS) ×1 IMPLANT
ELECT REM PT RETURN 9FT ADLT (ELECTROSURGICAL) ×2
ELECTRODE REM PT RTRN 9FT ADLT (ELECTROSURGICAL) ×1 IMPLANT
GAUZE SPONGE 4X4 12PLY STRL (GAUZE/BANDAGES/DRESSINGS) ×2 IMPLANT
GLOVE BIO SURGEON STRL SZ7.5 (GLOVE) ×2 IMPLANT
GOWN STRL REUS W/ TWL LRG LVL3 (GOWN DISPOSABLE) ×2 IMPLANT
GOWN STRL REUS W/ TWL XL LVL3 (GOWN DISPOSABLE) ×1 IMPLANT
GOWN STRL REUS W/TWL LRG LVL3 (GOWN DISPOSABLE) ×2
GOWN STRL REUS W/TWL XL LVL3 (GOWN DISPOSABLE) ×1
KIT BASIN OR (CUSTOM PROCEDURE TRAY) ×2 IMPLANT
KIT TURNOVER KIT B (KITS) ×2 IMPLANT
NDL HYPO 25GX1X1/2 BEV (NEEDLE) IMPLANT
NEEDLE HYPO 25GX1X1/2 BEV (NEEDLE) IMPLANT
NS IRRIG 1000ML POUR BTL (IV SOLUTION) ×2 IMPLANT
PACK GENERAL/GYN (CUSTOM PROCEDURE TRAY) ×2 IMPLANT
PAD ARMBOARD 7.5X6 YLW CONV (MISCELLANEOUS) ×4 IMPLANT
SPECIMEN JAR SMALL (MISCELLANEOUS) ×2 IMPLANT
STOCKINETTE 6  STRL (DRAPES) ×1
STOCKINETTE 6 STRL (DRAPES) IMPLANT
SUT ETHILON 3 0 PS 1 (SUTURE) ×2 IMPLANT
SYR CONTROL 10ML LL (SYRINGE) IMPLANT
TOWEL GREEN STERILE (TOWEL DISPOSABLE) ×4 IMPLANT
TOWEL GREEN STERILE FF (TOWEL DISPOSABLE) ×2 IMPLANT
UNDERPAD 30X36 HEAVY ABSORB (UNDERPADS AND DIAPERS) ×2 IMPLANT
WATER STERILE IRR 1000ML POUR (IV SOLUTION) ×2 IMPLANT

## 2020-09-06 NOTE — Progress Notes (Signed)
Orthopedic Tech Progress Note Patient Details:  Christopher Fernandez 02-Aug-1971 [id number]  Ortho Devices Type of Ortho Device: Darco shoe Ortho Device/Splint Location: LLE Ortho Device/Splint Interventions: Ordered,Application,Adjustment   Post Interventions Patient Tolerated: Well Instructions Provided: Care of device   Janit Pagan 09/06/2020, 12:56 PM

## 2020-09-06 NOTE — Evaluation (Signed)
Physical Therapy Evaluation Patient Details Name: Christopher Fernandez MRN: [id number] DOB: 12/18/1970 Today's Date: 09/06/2020   History of Present Illness  49 y.o. male with recent right TMA revision on 01/10/20. Vascular surgery has been consulted for evaluation of new left great toe osteomyelitis. Pt underwent left great toe amputation on 09/06/20.  PMH significant for COPD, DM, HTN, pancreatitis.  Clinical Impression  Patient presents with mild balance deficits mostly related to wearing bilateral darco shoes.  Patient very steady when ambulating with single point cane.  Reviewed verbally up and down stairs and pt voiced understanding.  Answered all questions and concerns related to mobility and returning home.  Patient has necessary support at home.  PT will sign off at this time.  May consider OPPT as needed for balance, gait with prothetic shoes if any issues.    Follow Up Recommendations Outpatient PT (if needed to increase mobility, strength as LE heals.)    Equipment Recommendations  Cane    Recommendations for Other Services       Precautions / Restrictions Precautions Precautions: Fall Required Braces or Orthoses: Other Brace Other Brace: DARCO shoe right and left LE Restrictions Weight Bearing Restrictions: No      Mobility  Bed Mobility Overal bed mobility: Independent                  Transfers Overall transfer level: Modified independent Equipment used: Rolling walker (2 wheeled);Straight cane             General transfer comment: initial transfer with RW - modified independent; later transfers with straight cane - modified independent  Ambulation/Gait Ambulation/Gait assistance: Modified independent (Device/Increase time) Gait Distance (Feet): 60 Feet Assistive device: 1 person hand held assist Gait Pattern/deviations: Step-to pattern;Decreased stride length;Wide base of support Gait velocity: decreased   General Gait Details: ambulated with RW for  ~10 and then switched to 1 person hand-held assist; OT in to see patient; PT returned with cane and observed patient ambulating with cane with OT.  No loss of balance, steady gait with wide base of support and step-to pattern.  Stairs            Wheelchair Mobility    Modified Rankin (Stroke Patients Only)       Balance Overall balance assessment: Mild deficits observed, not formally tested (balance deficits due to bilateral DARCO shoes; with cane, patient steady during mobility)                                           Pertinent Vitals/Pain Pain Assessment: No/denies pain (mentioned foot is still numb)    Home Living Family/patient expects to be discharged to:: Private residence Living Arrangements: Spouse/significant other Available Help at Discharge: Family Type of Home: House Home Access: Stairs to enter Entrance Stairs-Rails: Right;Left;Can reach both Technical brewer of Steps: 2 Home Layout: One level Home Equipment: None      Prior Function Level of Independence: Independent               Hand Dominance        Extremity/Trunk Assessment        Lower Extremity Assessment Lower Extremity Assessment: Overall WFL for tasks assessed;LLE deficits/detail LLE Deficits / Details: not fully assesses due to recent surgery; hip, knee WFL.       Communication   Communication: No difficulties  Cognition Arousal/Alertness: Awake/alert Behavior During  Therapy: WFL for tasks assessed/performed Overall Cognitive Status: Within Functional Limits for tasks assessed                                        General Comments      Exercises     Assessment/Plan    PT Assessment All further PT needs can be met in the next venue of care  PT Problem List Decreased balance;Decreased mobility       PT Treatment Interventions      PT Goals (Current goals can be found in the Care Plan section)  Acute Rehab PT  Goals Patient Stated Goal: see if I need a cane or not PT Goal Formulation: All assessment and education complete, DC therapy    Frequency     Barriers to discharge        Co-evaluation               AM-PAC PT "6 Clicks" Mobility  Outcome Measure Help needed turning from your back to your side while in a flat bed without using bedrails?: None Help needed moving from lying on your back to sitting on the side of a flat bed without using bedrails?: None Help needed moving to and from a bed to a chair (including a wheelchair)?: None Help needed standing up from a chair using your arms (e.g., wheelchair or bedside chair)?: None Help needed to walk in hospital room?: None Help needed climbing 3-5 steps with a railing? : None 6 Click Score: 24    End of Session Equipment Utilized During Treatment: Gait belt Activity Tolerance: Patient tolerated treatment well Patient left: in bed;with nursing/sitter in room;with family/visitor present Nurse Communication: Mobility status PT Visit Diagnosis: Unsteadiness on feet (R26.81)    Time: 1334 (1355 - 1400)-1345 PT Time Calculation (min) (ACUTE ONLY): 11 min   Charges:   PT Evaluation $PT Eval Moderate Complexity: 1 Mod          09/06/2020 Margie, PT Acute Rehabilitation Services Pager:  [phone] Office:  [place], [place] 09/06/2020, 2:29 PM

## 2020-09-06 NOTE — Op Note (Signed)
    Patient name: Christopher Fernandez MRN: [id number] DOB: 07-30-1971 Sex: male  09/06/2020 Pre-operative Diagnosis: Gangrene left great toe Post-operative diagnosis:  Same Surgeon:  Erlene Quan C. Donzetta Matters, MD Procedure Performed:  Left great toe ray amputation   Indications: 49 year old male with a now healed right transmetatarsal amputation has infection and early gangrenous changes of the left great toe.  He has palpable pulses to the level of the foot suggesting microvascular disease.  He is now indicated for great toe amputation on the left.  Findings: There was pulsatile bleeding from small vessels around the cavitation.  Tissue back to the level of the metatarsal phalangeal joint was healthy.  The wound was primarily closed.   Procedure:  The patient was identified in the holding area and taken to the operating was placed supine on the operative table.  A preoperative block had been placed.  An LMA was inserted.  Timeout was called antibiotics were up-to-date.  The block was checked.  A tennis racquet type incision was made around the first toe.  I dissected down to the bone back to the level of the metatarsal phalangeal joint space.  The toe was removed en bloc.  There was pulsatile bleeding this was controlled with cautery.  I remove the capsule of the first metatarsal head this was smoothed with rasp.  The wound was irrigated.  I did trim some of the skin back as there was excess skin.  I then closed the wound primarily with interrupted 3-0 nylon sutures.  A sterile dressing was applied.  He was awakened from anesthesia having tolerated procedure without any complication.  All counts were correct at completion.  EBL: 20 cc   Contina Strain C. Donzetta Matters, MD [facility] Office: [phone] Pager: [phone]

## 2020-09-06 NOTE — Anesthesia Postprocedure Evaluation (Signed)
Anesthesia Post Note  Patient: Christopher Fernandez  Procedure(s) Performed: AMPUTATION LEFT GREAT TOE (Left Toe)     Patient location during evaluation: PACU Anesthesia Type: Regional, General and MAC Level of consciousness: awake and alert Pain management: pain level controlled Vital Signs Assessment: post-procedure vital signs reviewed and stable Respiratory status: spontaneous breathing, nonlabored ventilation, respiratory function stable and patient connected to nasal cannula oxygen Cardiovascular status: blood pressure returned to baseline and stable Postop Assessment: no apparent nausea or vomiting Anesthetic complications: no   No complications documented.  Last Vitals:  Vitals:   09/06/20 1300 09/06/20 1426  BP: 128/77 113/74  Pulse: 62 70  Resp: 16   Temp: 36.9 C   SpO2: 100% 100%    Last Pain:  Vitals:   09/06/20 1300  TempSrc: Oral  PainSc:                  March Rummage Darbi Chandran

## 2020-09-06 NOTE — Transfer of Care (Signed)
Immediate Anesthesia Transfer of Care Note  Patient: Christopher Fernandez  Procedure(s) Performed: AMPUTATION LEFT GREAT TOE (Left Toe)  Patient Location: PACU  Anesthesia Type:General and Regional  Level of Consciousness: awake and alert   Airway & Oxygen Therapy: Patient Spontanous Breathing and Patient connected to face mask oxygen  Post-op Assessment: Report given to RN and Post -op Vital signs reviewed and stable  Post vital signs: Reviewed and stable  Last Vitals:  Vitals Value Taken Time  BP 118/81 09/06/20 1104  Temp    Pulse 69 09/06/20 1104  Resp 12 09/06/20 1104  SpO2 98 % 09/06/20 1104  Vitals shown include unvalidated device data.  Last Pain:  Vitals:   09/06/20 0940  TempSrc:   PainSc: 0-No pain      Patients Stated Pain Goal: 2 (00/34/91 7915)  Complications: No complications documented.

## 2020-09-06 NOTE — Progress Notes (Signed)
Family Medicine Teaching Service Daily Progress Note Intern Pager: [phone]  Patient name: Christopher Fernandez Medical record number: [id number] Date of birth: 08-21-1971 Age: 49 y.o. Gender: male  Primary Care Provider: Danna Hefty, DO Consultants: Ortho, VVS Code Status: DNR/DNI   Pt Overview and Major Events to Date:  12/22: admitted to [facility], MR with osteomyelitis of left foot 12/23: ortho and [facility] consulted, LLE doppler negative for DVT, Bilateral ABI's with noncompressable LE arteries 12/24: amputation of left great toe by [facility]VS  Assessment and Plan: Christopher Fernandez is a 49 y.o. male presenting with cellulitis of his left great toe. 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 He is NPO and DVT ppx held until after surgery. Wound culture pending. Currently on broad spectrum Unasyn and Vanc. Remains afebrile and hemodynamically stable. No leukocytosis. - ortho and [facility]VS consulted, appreciate there assistance with this patient - continue IV Unasyn (12/22-) and Vanc (12/22-) at this time - Follow up wound culture - AM CBC, BMP - hold DVT ppx, restart Lovenox after procedure - NPO for procedure - Tylenol 1g q6 hours with Ibuprofen 600mg  q6h PRN - MRSA PCR positive - will follow up after procedure  Abnormal ABI's: Arterial ABI's notable for bilateral noncompressable arteries in LE. Follows with [facility]VS outpatient. - follow up VVS recs  T2DM with neuropathy: chronic, stable CBG's range 150-230. Home meds: Jardiance 25 mg qd,  metformin 1000 mg twice daily, sitagliptin 100 mg daily, Basaglar 20 units every morning and Humalog sliding scale 2 to 4 units 3 times daily.  Gabapentin 300 mg 3 times daily. - continue Jardiance 25mg  QD - hold home Metformin while inpatient - Continue Linagliptin (on formulary, substitution for home sitagliptin) - Lantaus 20U (on formulary, substitution for Basaglar) qAM - sSSI - cont home Gabapentin - consider increase in insulin regimen after surgery pending CBG's overnight  Normocytic Anemia: Chronic, stable. Last iron panel in 2019 with normal iron, ferritin, iron sat. Likely anemia of chronic disease.  - AM CBC to monitor after surgery  Hyperlipidemia: Chronic, stable -Continue home atorvastatin 40mg  daily  Hx asthma: Chronic, stable Home medications include albuterol as needed, Singulair 10 mg - Dulera, per formulary - Albuterol PRN  Hx alcohol abuse  Tobacco use disorder Has been abstinent from alcohol for the last 10 months.  Currently smoking 1 pack/day, previous history of 1 to 2 packs/day for 10 to 12 years.  About a 25-pack-year smoker. Declines nicotine patch.  -Encourage smoking cessation - limit opiates if able given history  Hx Tremor: chronic, stable Recently started on 80 mg Propranolol ER by [facility] clinic. Patient without tremor on examination.  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 Not on prophylactic antibiotics. At risk for infections from encapsulated bacteria. Has had both COVID-19 vaccines, pneumococcal and Flu vaccine. -Appears that patient has not had Meningococal vaccine  Anxiety with Depression: Chronic, stable On aripiprazole 5 mg daily, Cymbalta 60 mg, mirtazapine 30 mg. - Continue home medications  FEN/GI:  Heart healthy/carb modified Prophylaxis: held for surgery, readd after  surgery  Disposition: med-surg  Subjective:  Patient off to procedure during exam. Spoke with wife who notes that he was doing well overnight. VVS noted that patient may be able to go home after the procedure. Patient would be open to this.  Objective: Temp:  [97.7 F (36.5 C)-98.7 F (37.1 C)] 98.7 F (37.1 C) (12/23 1340) Pulse Rate:  [58-75] 65 (12/24 0613) Resp:  [11-18] 17 (12/24 0613) BP: (115-131)/(75-93) 131/90 (12/24 0613) SpO2:  [93 %-100 %] 100 % (12/24 7989) Weight:  [117.5 kg] 117.5 kg (12/24 2119) Physical Exam:  **Patient was off for amputation at time of exam. Plan to see patient after procedure**  Laboratory: Recent Labs  Lab 09/04/20 1242 09/05/20 0642 09/05/20 1401  WBC 6.4 6.5 6.8  HGB 13.4 12.7* 12.4*  HCT 42.8 40.0 38.5*  PLT 252 256 257   Recent Labs  Lab 09/04/20 1242 09/05/20 0642 09/06/20 0113  NA 137 136 135  K 4.2 4.6 4.0  CL 100 102 102  CO2 26 25 26   BUN 11 11 12   CREATININE 1.01 0.83 0.84  CALCIUM 9.7 8.7* 8.4*  PROT 7.0  --   --   BILITOT 0.4  --   --   ALKPHOS 72  --   --   ALT 29  --   --   AST 18  --   --   GLUCOSE 145* 200* 231*    LA: 1.1 COVID/Flu neg Wound Cx: gram stain neg, culture pending  Urinalysis    Component Value Date/Time   COLORURINE YELLOW 09/04/2020 2128   APPEARANCEUR CLEAR 09/04/2020 2128   LABSPEC 1.035 (H) 09/04/2020 2128   PHURINE 5.0 09/04/2020 2128   GLUCOSEU >=500 (A) 09/04/2020 2128   HGBUR NEGATIVE 09/04/2020 2128   [place] NEGATIVE 09/04/2020 2128   KETONESUR NEGATIVE 09/04/2020 2128   PROTEINUR NEGATIVE 09/04/2020 2128   NITRITE NEGATIVE 09/04/2020 2128   LEUKOCYTESUR NEGATIVE 09/04/2020 2128    Imaging/Diagnostic Tests: VAS [place] ABI WITH/WO TBI  Result Date: 09/05/2020 LOWER EXTREMITY DOPPLER STUDY Indications: Rest pain. High Risk Factors: Hypertension, hyperlipidemia, Diabetes, current smoker. Other Factors: Hx of right TM amputation and osteomyelitis.  Performing  Technologist: Vonzell Schlatter RVT  Examination Guidelines: A complete evaluation includes at minimum, Doppler waveform signals and systolic blood pressure reading at the level of bilateral brachial, anterior tibial, and posterior tibial arteries, when vessel segments are accessible. Bilateral testing is considered an integral part of a complete examination. Photoelectric Plethysmograph (PPG) waveforms and toe systolic pressure readings are included as required and additional duplex testing as needed. Limited examinations for reoccurring indications may be performed as noted.  ABI Findings: +--------+------------------+-----+---------+--------+ Right   Rt Pressure (mmHg)IndexWaveform Comment  +--------+------------------+-----+---------+--------+ Brachial120                    triphasic         +--------+------------------+-----+---------+--------+ PTA     167               1.39 triphasic         +--------+------------------+-----+---------+--------+ DP      168               1.40 triphasic         +--------+------------------+-----+---------+--------+ +---------+------------------+-----+---------+---------------------------------+ Left     Lt Pressure (mmHg)IndexWaveform Comment                           +---------+------------------+-----+---------+---------------------------------+  Brachial 120                    triphasic                                  +---------+------------------+-----+---------+---------------------------------+ PTA      164               1.37 triphasic                                  +---------+------------------+-----+---------+---------------------------------+ DP       161               1.34 triphasic                                  +---------+------------------+-----+---------+---------------------------------+ Great Toe                       Normal   Unable to get a pressure due to                                            toe  circumference                 +---------+------------------+-----+---------+---------------------------------+ TOES Findings:  Right transmet amputation +---------+---------------+--------+-------+ Left ToesPressure (mmHg)WaveformComment +---------+---------------+--------+-------+ 1st Digit               Normal          +---------+---------------+--------+-------+   Arterial wall calcification precludes accurate ankle pressures and ABIs.  Summary: Right: Resting right ankle-brachial index indicates noncompressible right lower extremity arteries. Left: Resting left ankle-brachial index indicates noncompressible left lower extremity arteries.  *See table(s) above for measurements and observations.  Electronically signed by Monica Martinez MD on 09/05/2020 at 3:48:50 PM.    Final    VAS [place] LOWER EXTREMITY VENOUS (DVT)  Result Date: 09/05/2020  Lower Venous DVT Study Indications: Edema.  Performing Technologist: Vonzell Schlatter RVT  Examination Guidelines: A complete evaluation includes B-mode imaging, spectral Doppler, color Doppler, and power Doppler as needed of all accessible portions of each vessel. Bilateral testing is considered an integral part of a complete examination. Limited examinations for reoccurring indications may be performed as noted. The reflux portion of the exam is performed with the patient in reverse Trendelenburg.  +-----+---------------+---------+-----------+----------+--------------+ RIGHTCompressibilityPhasicitySpontaneityPropertiesThrombus Aging +-----+---------------+---------+-----------+----------+--------------+ CFV  Full           Yes      Yes                                 +-----+---------------+---------+-----------+----------+--------------+   +---------+---------------+---------+-----------+----------+--------------+ LEFT     CompressibilityPhasicitySpontaneityPropertiesThrombus Aging  +---------+---------------+---------+-----------+----------+--------------+ CFV      Full           Yes      Yes                                 +---------+---------------+---------+-----------+----------+--------------+ SFJ      Full                                                        +---------+---------------+---------+-----------+----------+--------------+  FV Prox  Full                                                        +---------+---------------+---------+-----------+----------+--------------+ FV Mid   Full                                                        +---------+---------------+---------+-----------+----------+--------------+ FV DistalFull                                                        +---------+---------------+---------+-----------+----------+--------------+ PFV      Full                                                        +---------+---------------+---------+-----------+----------+--------------+ POP      Full                                                        +---------+---------------+---------+-----------+----------+--------------+ PTV      Full           Yes      Yes                                 +---------+---------------+---------+-----------+----------+--------------+ PERO     Full                                                        +---------+---------------+---------+-----------+----------+--------------+     Summary: RIGHT: - No evidence of deep vein thrombosis in the lower extremity. No indirect evidence of obstruction proximal to the inguinal ligament.  LEFT: - There is no evidence of deep vein thrombosis in the lower extremity.  - No cystic structure found in the popliteal fossa.  *See table(s) above for measurements and observations. Electronically signed by Monica Martinez MD on 09/05/2020 at 3:49:57 PM.    Final    Danna Hefty, DO 09/06/2020, 8:11 AM PGY-3, [facility] pager: [phone], text pages welcome

## 2020-09-06 NOTE — Anesthesia Procedure Notes (Signed)
Procedure Name: MAC Date/Time: 09/06/2020 10:30 AM Performed by: Reece Agar, CRNA Pre-anesthesia Checklist: Patient identified, Emergency Drugs available, Suction available and Patient being monitored Patient Re-evaluated:Patient Re-evaluated prior to induction Oxygen Delivery Method: Simple face mask Preoxygenation: Pre-oxygenation with 100% oxygen Induction Type: IV induction Placement Confirmation: positive ETCO2 and breath sounds checked- equal and bilateral Dental Injury: Teeth and Oropharynx as per pre-operative assessment

## 2020-09-06 NOTE — Anesthesia Preprocedure Evaluation (Signed)
Anesthesia Evaluation  Patient identified by MRN, date of birth, ID band Patient awake    Reviewed: Allergy & Precautions, NPO status , Patient's Chart, lab work & pertinent test results  Airway Mallampati: II  TM Distance: >3 FB Neck ROM: Full    Dental  (+) Teeth Intact   Pulmonary asthma , COPD,  COPD inhaler, Current Smoker and Patient abstained from smoking.,    Pulmonary exam normal        Cardiovascular hypertension, Pt. on medications  Rhythm:Regular Rate:Normal     Neuro/Psych Anxiety Depression    GI/Hepatic Neg liver ROS, hiatal hernia, GERD  Medicated,  Endo/Other  diabetes, Poorly Controlled, Type 2, Insulin Dependent, Oral Hypoglycemic Agents  Renal/GU negative Renal ROS  negative genitourinary   Musculoskeletal  (+) Arthritis , Cellulitis left toe   Abdominal (+)  Abdomen: soft. Bowel sounds: normal.  Peds  Hematology  (+) anemia ,   Anesthesia Other Findings   Reproductive/Obstetrics                             Anesthesia Physical Anesthesia Plan  ASA: III  Anesthesia Plan: MAC and Regional   Post-op Pain Management:    Induction:   PONV Risk Score and Plan: Ondansetron, Propofol infusion and Treatment may vary due to age or medical condition  Airway Management Planned: Simple Face Mask, Natural Airway and Nasal Cannula  Additional Equipment: None  Intra-op Plan:   Post-operative Plan:   Informed Consent: I have reviewed the patients History and Physical, chart, labs and discussed the procedure including the risks, benefits and alternatives for the proposed anesthesia with the patient or authorized representative who has indicated his/her understanding and acceptance.     Dental advisory given  Plan Discussed with: CRNA  Anesthesia Plan Comments: (Lab Results      Component                Value               Date                      WBC                       6.8                 09/05/2020                HGB                      12.4 (L)            09/05/2020                HCT                      38.5 (L)            09/05/2020                MCV                      88.5                09/05/2020                PLT  257                 09/05/2020           Lab Results      Component                Value               Date                      NA                       135                 09/06/2020                K                        4.0                 09/06/2020                CO2                      26                  09/06/2020                GLUCOSE                  231 (H)             09/06/2020                BUN                      12                  09/06/2020                CREATININE               0.84                09/06/2020                CALCIUM                  8.4 (L)             09/06/2020                GFRNONAA                 >60                 09/06/2020                GFRAA                    116                 03/12/2020          )        Anesthesia Quick Evaluation

## 2020-09-06 NOTE — Anesthesia Procedure Notes (Signed)
Procedure Name: LMA Insertion Date/Time: 09/06/2020 10:44 AM Performed by: Reece Agar, CRNA Pre-anesthesia Checklist: Patient identified, Emergency Drugs available, Suction available and Patient being monitored Patient Re-evaluated:Patient Re-evaluated prior to induction Oxygen Delivery Method: Circle system utilized Preoxygenation: Pre-oxygenation with 100% oxygen Induction Type: IV induction LMA: LMA inserted LMA Size: 5.0 Number of attempts: 1 Placement Confirmation: positive ETCO2 and breath sounds checked- equal and bilateral

## 2020-09-06 NOTE — Progress Notes (Signed)
FPTS Interim Progress Note  Received page from nurse that patient has a few red spots on his arm near the IV. She is not sure if this was present prior. No other rash on any other part of body. NO other symptoms. NO difficulty breathing. Has received Vanc and Unasyn without issue this entire admission. Low likelihood of antibiotics. Recommended slowing down vanc rate and continuing to monitor.   Mina Marble [place], DO 09/06/2020, 1:31 PM PGY-3, [facility] pager [phone]

## 2020-09-06 NOTE — Anesthesia Procedure Notes (Signed)
Anesthesia Regional Block: Popliteal block   Pre-Anesthetic Checklist: ,, timeout performed, Correct Patient, Correct Site, Correct Laterality, Correct Procedure, Correct Position, site marked, Risks and benefits discussed,  Surgical consent,  Pre-op evaluation,  At surgeon's request and post-op pain management  Laterality: Left  Prep: Dura Prep       Needles:  Injection technique: Single-shot  Needle Type: Echogenic Stimulator Needle     Needle Length: 9cm  Needle Gauge: 20     Additional Needles:   Procedures:,,,, ultrasound used (permanent image in chart),,,,  Narrative:  Start time: 09/06/2020 9:33 AM End time: 09/06/2020 9:37 AM Injection made incrementally with aspirations every 5 mL.  Performed by: Personally  Anesthesiologist: Darral Dash, DO  Additional Notes: Patient identified. Risks/Benefits/Options discussed with patient including but not limited to bleeding, infection, nerve damage, failed block, incomplete pain control. Patient expressed understanding and wished to proceed. All questions were answered. Sterile technique was used throughout the entire procedure. Please see nursing notes for vital signs. Aspirated in 5cc intervals with injection for negative confirmation. Patient was given instructions on fall risk and not to get out of bed. All questions and concerns addressed with instructions to call with any issues or inadequate analgesia.

## 2020-09-06 NOTE — Plan of Care (Signed)

## 2020-09-06 NOTE — Progress Notes (Signed)
FPTS Interim Progress Note  S: Went to see patient after procedure.  He is sitting up comfortably in bed.  Denies any pain.  Notes that the procedure went really smoothly.  He is looking forward to going home tomorrow.  O: BP 113/74 (BP Location: Right Arm)   Pulse 70   Temp 98.6 F (37 C) (Oral)   Resp 16   Wt 117.5 kg   SpO2 100%   BMI 32.38 kg/m   General: pleasant older male, sitting up comfortably in bed, well nourished, well developed, in no acute distress with non-toxic appearance Resp: breathing comfortably on room air, speaking in full sentence\ Extremities: ACE bandage clean and intact Neuro: Alert and oriented, speech normal   A/P: - plan for discharge 12/25 - Percocet sent to [facility] by [facility] - no antibiotics indicated - follow up with [facility] outpatient   Danna Hefty, DO 09/06/2020, 12:15 PM PGY-3, [facility] pager [phone]

## 2020-09-06 NOTE — Plan of Care (Signed)
  Problem: Education: Goal: Knowledge of General Education information will improve Description Including pain rating scale, medication(s)/side effects and non-pharmacologic comfort measures Outcome: Progressing   

## 2020-09-06 NOTE — Progress Notes (Signed)
   09/06/20 0910  OBSTRUCTIVE SLEEP APNEA  Have you ever been diagnosed with sleep apnea through a sleep study? No  Do you snore loudly (loud enough to be heard through closed doors)?  1  Do you often feel tired, fatigued, or sleepy during the daytime (such as falling asleep during driving or talking to someone)? 0  Has anyone observed you stop breathing during your sleep? 1  Do you have, or are you being treated for high blood pressure? 1  BMI more than 35 kg/m2? 0  Age > 50 (1-yes) 1  Neck circumference greater than:Male 16 inches or larger, Male 17inches or larger? 0  Male Gender (Yes=1) 1  Obstructive Sleep Apnea Score 5

## 2020-09-06 NOTE — Interval H&P Note (Signed)
History and Physical Interval Note:  09/06/2020 9:20 AM  [name]  has presented today for surgery, with the diagnosis of diabetic foot infection.  The various methods of treatment have been discussed with the patient and family. After consideration of risks, benefits and other options for treatment, the patient has consented to  Procedure(s): AMPUTATION LEFT GREAT TOE (Left) as a surgical intervention.  The patient's history has been reviewed, patient examined, no change in status, stable for surgery.  I have reviewed the patient's chart and labs.  Questions were answered to the patient's satisfaction.     Christopher Fernandez

## 2020-09-06 NOTE — Evaluation (Signed)
Occupational Therapy Evaluation Patient Details Name: Christopher Fernandez MRN: [id number] DOB: 1971-07-21 Today's Date: 09/06/2020    History of Present Illness 49 y.o. male with recent right TMA revision on 01/10/20. Vascular surgery has been consulted for evaluation of new left great toe osteomyelitis. Pt underwent left great toe amputation on 09/06/20.  PMH significant for COPD, DM, HTN, pancreatitis.   Clinical Impression   PTA, pt was living with his wife and was independent; has been compensating for RLE amp and DARCO shoe. Pt currently performing ADLs and functional mobility at Mod I level with increased time and use of cane. Providing pt with education on compensatory techniques for LB ADLs, toileting, and tub transfer. Pt verbalized and demonstrated understanding. Answered all pt questions. Recommend dc home once medically stable per physician. All acute OT needs met and will sign off. Thank you.    Follow Up Recommendations  No OT follow up;Supervision - Intermittent    Equipment Recommendations  None recommended by OT    Recommendations for Other Services PT consult     Precautions / Restrictions Precautions Precautions: Fall Required Braces or Orthoses: Other Brace Other Brace: DARCO shoe right and left LE Restrictions Weight Bearing Restrictions: No      Mobility Bed Mobility Overal bed mobility: Independent                  Transfers Overall transfer level: Modified independent Equipment used: Rolling walker (2 wheeled);Straight cane             General transfer comment: initial transfer with RW - modified independent; later transfers with straight cane - modified independent    Balance Overall balance assessment: Mild deficits observed, not formally tested (balance deficits due to bilateral [facility] shoes; with cane, patient steady during mobility)                                         ADL either performed or assessed with clinical  judgement   ADL Overall ADL's : Modified independent                                       General ADL Comments: Pt performing ADLs and functional mobiltiy at Mod I - independent level. Providing education on compensatory techniques for LB ADLs, toileting, and tub transfer.     Vision Baseline Vision/History: Wears glasses Wears Glasses: At all times Patient Visual Report: No change from baseline       Perception     Praxis      Pertinent Vitals/Pain Pain Assessment: No/denies pain (mentioned foot is still numb)     Hand Dominance     Extremity/Trunk Assessment Upper Extremity Assessment Upper Extremity Assessment: Overall WFL for tasks assessed   Lower Extremity Assessment Lower Extremity Assessment: Overall WFL for tasks assessed;LLE deficits/detail LLE Deficits / Details: not fully assesses due to recent surgery; hip, knee WFL.   Cervical / Trunk Assessment Cervical / Trunk Assessment: Normal   Communication Communication Communication: No difficulties   Cognition Arousal/Alertness: Awake/alert Behavior During Therapy: WFL for tasks assessed/performed Overall Cognitive Status: Within Functional Limits for tasks assessed  General Comments  Wife present throughout session    Exercises     Shoulder Instructions      Home Living Family/patient expects to be discharged to:: Private residence Living Arrangements: Spouse/significant other Available Help at Discharge: Family Type of Home: House Home Access: Stairs to enter Technical brewer of Steps: 2 Entrance Stairs-Rails: Right;Left;Can reach both Home Layout: One level     Bathroom Shower/Tub: Teacher, early years/pre: Standard     Home Equipment: None          Prior Functioning/Environment Level of Independence: Independent                 OT Problem List: Impaired balance (sitting and/or standing);Pain       OT Treatment/Interventions:      OT Goals(Current goals can be found in the care plan section) Acute Rehab OT Goals Patient Stated Goal: Go home OT Goal Formulation: All assessment and education complete, DC therapy  OT Frequency:     Barriers to D/C:            Co-evaluation              AM-PAC OT "6 Clicks" Daily Activity     Outcome Measure Help from another person eating meals?: None Help from another person taking care of personal grooming?: None Help from another person toileting, which includes using toliet, bedpan, or urinal?: None Help from another person bathing (including washing, rinsing, drying)?: None Help from another person to put on and taking off regular upper body clothing?: None Help from another person to put on and taking off regular lower body clothing?: None 6 Click Score: 24   End of Session Equipment Utilized During Treatment: Rolling walker;Other (comment) [facility]) Nurse Communication: Mobility status  Activity Tolerance: Patient tolerated treatment well Patient left: in bed;with call bell/phone within reach;with family/visitor present  OT Visit Diagnosis: Unsteadiness on feet (R26.81);Other abnormalities of gait and mobility (R26.89);Pain                Time: 4132-4401 OT Time Calculation (min): 13 min Charges:  OT General Charges $OT Visit: 1 Visit OT Evaluation $OT Eval Low Complexity: [place], OTR/L Acute Rehab Pager: [phone] Office: [place] 09/06/2020, 2:50 PM

## 2020-09-07 ENCOUNTER — Encounter (HOSPITAL_COMMUNITY): Payer: Self-pay | Admitting: Vascular Surgery

## 2020-09-07 LAB — CBC
HCT: 37.2 % — ABNORMAL LOW (ref 39.0–52.0)
Hemoglobin: 12.2 g/dL — ABNORMAL LOW (ref 13.0–17.0)
MCH: 28.8 pg (ref 26.0–34.0)
MCHC: 32.8 g/dL (ref 30.0–36.0)
MCV: 87.9 fL (ref 80.0–100.0)
Platelets: 282 10*3/uL (ref 150–400)
RBC: 4.23 MIL/uL (ref 4.22–5.81)
RDW: 14.1 % (ref 11.5–15.5)
WBC: 7.6 10*3/uL (ref 4.0–10.5)
nRBC: 0 % (ref 0.0–0.2)

## 2020-09-07 LAB — GLUCOSE, CAPILLARY: Glucose-Capillary: 204 mg/dL — ABNORMAL HIGH (ref 70–99)

## 2020-09-07 MED ORDER — ACETAMINOPHEN ER 650 MG PO TBCR: 650.0000 mg | EXTENDED_RELEASE_TABLET | Freq: Three times a day (TID) | ORAL | Status: DC | PRN

## 2020-09-07 NOTE — Discharge Instructions (Signed)
Dear Christopher Fernandez,  Thank you for letting us participate in your care. You were hospitalized for osteomyelitis (bone infection) of your left big toe.  You were treated with a toe amputation by vascular surgery.  POST-HOSPITAL & CARE INSTRUCTIONS 1. The vascular surgeon believes he cut out all of the infected tissue.  Since this was a localized infection, you do not need antibiotics going forward. 2. You will have a hospital follow-up appointment with your family medicine clinic to check in on how you are doing, how your toe is, and how your pain is. 3. Please make an appointment to follow-up with your vascular surgeon in a couple weeks to have the sutures removed.  The information is below. 4. Follow the instructions from your vascular surgeon regarding care of your left foot.  In general, keep the area clean, dry, and covered.  Do not use harsh chemicals or solutions on your surgical site such as rubbing alcohol or hydrogen peroxide.  Simply use regular hand soap, dry well, and cover with a bandage dressing. 5. Go to your follow up appointments (listed below)   DOCTOR'S APPOINTMENT   Future Appointments  Date Time Provider [place]  09/12/2020  1:45 PM Zola Button, MD [facility]  09/23/2020  1:30 PM Danna Hefty, DO [facility]    Follow-up Information    Waynetta Sandy, MD. Schedule an appointment as soon as possible for a visit in 2 day(s).   Specialties: Vascular Surgery, Cardiology Why: Call and make a follow up appointment with your vascular surgeon to have the sutures removed in a couple weeks.  Contact information: [place] [address] [place]. Go on 09/12/2020.   Why: Thurs 12/30 at 1:45pm. This is your hospital follow up appointment with your family meedicine clinic. You will see Dr. Nancy Fetter for this appointment.  Contact information: [place]  [place] [id number]       Mina Marble P, DO. Go on 09/23/2020.   Specialty: Family Medicine Why: Mon. 1/10 at 1:30pm. This is a regular follow up appointment with your PCP, Dr. Tarry Kos. This appointment will be for your diabetes and other health conditions.  Contact information: [address] [place] [address] [phone]               Take care and be well!  [facility]  [place], [place] [address] [phone]    Osteomyelitis, Adult  Bone infections (osteomyelitis) occur when bacteria or other germs get inside a bone. This can happen if you have an infection in another part of your body that spreads through your blood. Germs from your skin or from outside of your body can also cause this type of infection if you have a wound or a broken bone (fracture) that breaks the skin. Bone infections need to be treated quickly to prevent bone damage and to prevent the infection from spreading to other areas of your body. What are the causes? Most bone infections are caused by bacteria. They can also be caused by other germs, such as viruses and funguses. What increases the risk? You are more likely to develop this condition if you:  Recently had surgery, especially bone or joint surgery.  Have a long-term (chronic) disease, such as: ? Diabetes. ? HIV (human immunodeficiency virus). ? Rheumatoid arthritis. ?  Sickle cell anemia. ? Kidney disease that requires dialysis.  Are aged 48 years or older.  Have a condition or take medicines that block or weaken your body's defense system (immune system).  Have a condition that reduces your blood flow.  Have an artificial joint.  Have had a joint or bone repaired with plates or screws (surgical hardware).  Use IV drugs.  Have a central line for IV access.  Have had trauma, such as stepping on a nail or a broken bone that  came through the skin. What are the signs or symptoms? Symptoms vary depending on the type and location of your infection. Common symptoms of bone infections include:  Fever and chills.  Skin redness and warmth.  Swelling.  Pain and stiffness.  Drainage of fluid or pus near the infection. How is this diagnosed? This condition may be diagnosed based on:  Your symptoms and medical history.  A physical exam.  Tests, such as: ? A sample of tissue, fluid, or blood taken to be examined under a microscope. ? Pus or discharge swabbed from a wound for testing to identify germs and to determine what type of medicine will kill them (culture and sensitivity). ? Blood tests.  Imaging studies. These may include: ? X-rays. ? MRI. ? CT scan. ? Bone scan. ? Ultrasound. How is this treated? Treatment for this condition depends on the cause and type of infection. Antibiotic medicines are usually the first treatment for a bone infection. This may be done in a hospital at first. You may have to continue IV antibiotics at home or take antibiotics by mouth for several weeks after that. Other treatments may include surgery to remove:  Dead or dying tissue from a bone.  An infected artificial joint.  Infected plates or screws that were used to repair a broken bone. Follow these instructions at home: Medicines   Take over-the-counter and prescription medicines only as told by your health care provider.  Take your antibiotic medicine as told by your health care provider. Do not stop taking the antibiotic even if you start to feel better.  Follow instructions from your health care provider about how to take IV antibiotics at home. You may need to have a nurse come to your home to give you the IV antibiotics. General instructions   Ask your health care provider if you have any restrictions on your activities.  If directed, put ice on the affected area: ? Put ice in a plastic bag. ? Place a  towel between your skin and the bag. ? Leave the ice on for 20 minutes, 2-3 times a day.  Wash your hands often with soap and water. If soap and water are not available, use hand sanitizer.  Do not use any products that contain nicotine or tobacco, such as cigarettes and e-cigarettes. These can delay bone healing. If you need help quitting, ask your health care provider.  Keep all follow-up visits as told by your health care provider. This is important. Contact a health care provider if:  You develop a fever or chills.  You have redness, warmth, pain, or swelling that returns after treatment. Get help right away if:  You have rapid breathing or you have trouble breathing.  You have chest pain.  You cannot drink fluids or make urine.  The affected area swells, changes color, or turns blue.  You have numbness or severe pain in the affected area. Summary  Bone infections (osteomyelitis) occur when bacteria or other germs  get inside a bone.  You may be more likely to get this type of infection if you have a condition, such as diabetes, that lowers your ability to fight infection or increases your chances of getting an infection.  Most bone infections are caused by bacteria. They can also be caused by other germs, such as viruses and funguses.  Treatment for this condition usually starts with taking antibiotics. Further treatment depends on the cause and type of infection. This information is not intended to replace advice given to you by your health care provider. Make sure you discuss any questions you have with your health care provider. Document Revised: 09/16/2017 Document Reviewed: 09/09/2017 Elsevier Patient Education  2020 [employer].   Toe Amputation Toe amputation is a surgical procedure to remove all or part of a toe. A person may have this procedure if:  Tissue in the toe is dying because of poor blood supply.  There is a severe infection in the toe.  There is a  cancerous tumor in the toe.  There is a traumatic injury in the toe. Removing the toe keeps nearby tissue healthy. If the toe is infected, removing it helps to keep the infection from spreading. Tell a health care provider about:  Any allergies you have.  All medicines you are taking, including vitamins, herbs, eye drops, creams, and over-the-counter medicines.  Any problems you or family members have had with anesthetic medicines.  Any blood disorders you have.  Any surgeries you have had.  Any medical conditions you have or have had.  Whether you are pregnant or may be pregnant. What are the risks? Generally, this is a safe procedure. However, problems may occur, including:  Bleeding.  Buildup of blood and fluid (hematoma).  Infection.  Allergic reactions to medicines.  Tissue death in the flap of skin (flap necrosis).  Trouble with healing.  Minor changes in the way that you walk (your gait).  Feeling pain in the area that was removed (phantom pain). This is rare. What happens before the procedure? Staying hydrated Follow instructions from your health care provider about hydration, which may include:  Up to 2 hours before the procedure - you may continue to drink clear liquids, such as water, clear fruit juice, black coffee, and plain tea.  Eating and drinking restrictions Follow instructions from your health care provider about eating and drinking, which may include:  8 hours before the procedure - stop eating heavy meals or foods, such as meat, fried foods, or fatty foods.  6 hours before the procedure - stop eating light meals or foods, such as toast or cereal.  6 hours before the procedure - stop drinking milk or drinks that contain milk.  2 hours before the procedure - stop drinking clear liquids. Medicines Ask your health care provider about:  Changing or stopping your regular medicines. This is especially important if you are taking diabetes medicines  or blood thinners.  Taking medicines such as aspirin and ibuprofen. These medicines can thin your blood. Do not take these medicines unless your health care provider tells you to take them.  Taking over-the-counter medicines, vitamins, herbs, and supplements. General instructions  Ask your health care provider: ? How your surgery site will be marked. ? What steps will be taken to help prevent infection. These may include:  Washing skin with a germ-killing soap.  Taking antibiotic medicine.  Plan to have someone take you home from the hospital or clinic.  If you will be going  home right after the procedure, plan to have someone with you for 24 hours. What happens during the procedure?  An IV will be inserted into one of your veins.  You will be given one or more of the following: ? A medicine to help you relax (sedative). ? A medicine to numb the area (local anesthetic). ? A medicine to make you fall asleep (general anesthetic). ? A medicine that is injected into your spine to numb the area below and slightly above the injection site (spinal anesthetic). ? A medicine that is injected into an area of your body to numb everything below the injection site (regional anesthetic).  Your surgeon will mark the area of your toe for removal.  Your surgeon will make an incision in your toe.  The dead tissue and bone will be removed.  Nerves and blood vessels will be tied or heated with a tool to stop bleeding.  The area will be drained and cleaned.  If only part of a toe is removed, the remaining part will be covered with a flap of skin.  The incision will be treated in one of these ways: ? It will be closed with stitches (sutures). ? It will be left open to heal if there is an infection.  The incision area may be packed with gauze and covered with bandages (dressings).  Tissue samples may be sent to a lab to be examined under a microscope. The procedure may vary among health care  providers and hospitals. What happens after the procedure?  Your blood pressure, heart rate, breathing rate, and blood oxygen level will be monitored until you leave the hospital or clinic.  Your foot will be raised up (elevated) to relieve swelling.  You will be monitored for pain.  You will be given pain medicines and antibiotics.  Your health care provider or physical therapist will help you move around as soon as possible.  Do not drive for 24 hours if you were given a sedative during your procedure. Summary  Toe amputation is a surgical procedure to remove all or part of a toe.  Before the procedure, follow instructions from your health care provider about taking medicines and about eating or drinking restrictions.  During the procedure, steps will be taken to reduce your risk of infection.  After the procedure, you will be given pain medicines and antibiotics.  After the procedure, your foot will be raised up (elevated) to relieve swelling. This information is not intended to replace advice given to you by your health care provider. Make sure you discuss any questions you have with your health care provider. Document Revised: 08/24/2018 Document Reviewed: 08/24/2018 Elsevier Patient Education  2020 [employer].

## 2020-09-07 NOTE — Progress Notes (Addendum)
Patient received discharge instructions. Verbalized understanding. Dressing was applied, dressing with some dressing supplies,  instructions were given on dressing change

## 2020-09-07 NOTE — Discharge Summary (Addendum)
[facility] Discharge Summary  Patient name: Christopher Fernandez Medical record number: [id number] Date of birth: [birth year] Age: 49 y.o. 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 History of poor wound healing History of MRSA infection Abnormal ABIs Type 2 diabetes with neuropathy History of asplenia-needs meningococcal vaccine Normocytic anemia Hyperlipidemia Asthma Current tremor Anxiety and depression Current tobacco use History of alcohol abuse (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 male presenting with cellulitis of his left great toe. 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   Cellulitis left foot  Hx uncontrolled diabetes  Hx amputation of midfoot and transmetatarsal of right foot  Referred to ED from [facility] clinic for further evaluation and  management of left foot infection and concern for osteomyelitis.  Patient had failed 48 hours of p.o. antibiotics prior to admission.  Admission labs unremarkable, CBC, BMP and lactic acid WNL. Left foot x-ray significant for soft tissue wound of distal medial first toe and bone destruction of the medial tuft of the first distal phalanx, concerning for osteomyelitis.  MRI of left foot showed soft tissue ulceration along the plantar aspect of the distal great toe with a small underlying soft tissue abscess and total erosion of the distal 1st phalanx with marrow edema and evidence of osteomyelitis. Patient treated with IV Zosyn and vancomycin.  Due to swelling of foot, left lower extremity Doppler was ordered to rule out DVT which were negative. Bilateral ABI's noted noncompressable arteries bilaterally. Vascular surgery performed left great toe amputation on 92/11 without complication. Patient was monitored overnight and discharged on 12/25 with Percocet for pain control. No antibiotics were continued upon discharge. Patient remained afebrile and hemodynamically stable throughout admission. He was discharged home with recommendation to follow up closely with PCP and [facility]VS.    Other chronic conditions stable.     Issues for Follow Up:  Patient is asplenic.  Should have his meningococcal vaccine.  He is already received both Covid vaccines, pneumococcal and flu vaccine. Consider Ozempic as outpatient for better glycemic control  Significant Procedures: Left first phalanx amputation 12/24 by Dr. Donzetta Matters of [facility]  Significant Labs and Imaging:  Recent Labs  Lab 09/05/20 1401 09/06/20 2152 09/07/20 0126  WBC 6.8 7.1 7.6  HGB 12.4* 12.0* 12.2*  HCT 38.5* 37.0* 37.2*  PLT 257 279 282   Recent Labs  Lab 09/04/20 1242 09/05/20 0642 09/06/20 0113 09/06/20 1258 09/06/20 2152  NA 137 136 135 137  --   K 4.2 4.6 4.0 4.7  --   CL 100 102 102 102  --  CO2 26 25 26 28   --   GLUCOSE 145* 200* 231* 113*   --   BUN 11 11 12 9   --   CREATININE 1.01 0.83 0.84 0.80 0.93  CALCIUM 9.7 8.7* 8.4* 8.8*  --   ALKPHOS 72  --   --   --   --   AST 18  --   --   --   --   ALT 29  --   --   --   --   ALBUMIN 3.7  --   --   --   --     LEFT FOOT - COMPLETE 3+ VIEW 09/04/2020 IMPRESSION: 1. Soft tissue wound of the distal medial first toe. Bone destruction of the medial tuft of the first distal phalanx concerning for osteomyelitis. 2. Mild osteoarthritis of the first MTP joint.  MRI OF THE LEFT FOREFOOT WITHOUT AND WITH CONTRAST 09/04/2020 IMPRESSION: 1. Soft tissue ulceration along the plantar aspect of the distal great toe with a small underlying soft tissue abscess. 2. Total erosion of the distal 1st phalanx with marrow edema and enhancement compatible with osteomyelitis. 3. Fatty atrophy with T2 hyperintensity and enhancement within the abductor hallucis muscle, likely reflecting subacute denervation.  Results/Tests Pending at Time of Discharge: Wound culture  Discharge Medications:  Allergies as of 09/07/2020       Reactions   Eggs Or Egg-derived Products Rash   Morphine And Related Other (See Comments)   Cant take because of pancreatitis   Cocoa Rash        Medication List     STOP taking these medications    ciprofloxacin 500 MG tablet Commonly known as: CIPRO   doxycycline 100 MG capsule Commonly known as: VIBRAMYCIN       TAKE these medications    acetaminophen 650 MG CR tablet Commonly known as: Tylenol 8 Hour Take 1 tablet (650 mg total) by mouth every 8 (eight) hours as needed for pain.   albuterol 108 (90 Base) MCG/ACT inhaler Commonly known as: VENTOLIN HFA Inhale 2-4 puffs into the lungs every 4 (four) hours as needed for wheezing (or cough). What changed:  how much to take reasons to take this   ARIPiprazole 5 MG tablet Commonly known as: ABILIFY TAKE 1 TABLET (5 MG TOTAL) BY MOUTH DAILY.   atorvastatin 40 MG tablet Commonly known as:  LIPITOR Take 1 tablet (40 mg total) by mouth daily.   Basaglar KwikPen 100 UNIT/ML INJECT 0.2 MLS (20 UNITS TOTAL) INTO THE SKIN IN THE MORNING.   bismuth subsalicylate 99991111 [id number] suspension Commonly known as: PEPTO BISMOL Take 30 mLs by mouth every 6 (six) hours as needed for indigestion or diarrhea or loose stools.   Dexcom G6 Receiver Devi 1 Device by Does not apply route 4 (four) times daily.   diphenhydrAMINE 25 MG tablet Commonly known as: BENADRYL Take 25 mg by mouth every 6 (six) hours as needed for itching or allergies.   DULoxetine 60 MG capsule Commonly known as: CYMBALTA TAKE 1 CAPSULE (60 MG TOTAL) BY MOUTH DAILY.   Easy Touch Pen Needles 31G X 5 MM Misc Generic drug: Insulin Pen Needle Inject 1 application as directed 3 (three) times daily.   empagliflozin 25 MG Tabs tablet Commonly known as: JARDIANCE Take 25 mg by mouth daily.   Fluticasone-Salmeterol 250-50 MCG/DOSE Aepb Commonly known as: Advair Diskus Inhale 1 puff into the lungs 2 (two) times daily.   freestyle lancets Use as instructed   FREESTYLE LITE  test strip Generic drug: glucose blood USE AS INSTRUCTED   glucose blood test strip USE AS INSTRUCTED   gabapentin 300 MG capsule Commonly known as: NEURONTIN TAKE 1 CAPSULE (300 MG TOTAL) BY MOUTH 3 (THREE) TIMES DAILY.   HumaLOG KwikPen 100 UNIT/ML KwikPen Generic drug: insulin lispro INJECT 0-10 UNITS TOTAL INTO THE SKIN 3 TIMES DAILY WITH MEALS. What changed: See the new instructions.   ibuprofen 200 MG tablet Commonly known as: ADVIL Take 800 mg by mouth every 8 (eight) hours as needed for headache or moderate pain.   metFORMIN 1000 MG tablet Commonly known as: GLUCOPHAGE TAKE 1 TABLET (1,000 MG TOTAL) BY MOUTH 2 (TWO) TIMES DAILY WITH A MEAL.   mirtazapine 30 MG tablet Commonly known as: REMERON Take 1 tablet (30 mg total) by mouth at bedtime.   montelukast 10 MG tablet Commonly known as: SINGULAIR TAKE 1 TABLET (10 MG TOTAL)  BY MOUTH AT BEDTIME.   oxyCODONE-acetaminophen 5-325 MG tablet Commonly known as: Percocet Take 1 tablet by mouth every 4 (four) hours as needed for up to 30 doses for severe pain.   propranolol ER 80 MG 24 hr capsule Commonly known as: Inderal LA Take 1 capsule (80 mg total) by mouth daily.   sitaGLIPtin 100 MG tablet Commonly known as: Januvia Take 1 tablet (100 mg total) by mouth daily.        Discharge Instructions: Please refer to Patient Instructions section of EMR for full details.  Patient was counseled important signs and symptoms that should prompt return to medical care, changes in medications, dietary instructions, activity restrictions, and follow up appointments.   Follow-Up Appointments:  Follow-up Information     Waynetta Sandy, MD. Schedule an appointment as soon as possible for a visit in 2 day(s).   Specialties: Vascular Surgery, Cardiology Why: Call and make a follow up appointment with your vascular surgeon to have the sutures removed in a couple weeks.  Contact information: [name] [address] [place]. Go on 09/12/2020.   Why: Thurs 12/30 at 1:45pm. This is your hospital follow up appointment with your family meedicine clinic. You will see Dr. Nancy Fetter for this appointment.  Contact information: [place] [id number]        Mina Marble P, DO. Go on 09/23/2020.   Specialty: Family Medicine Why: Mon. 1/10 at 1:30pm. This is a regular follow up appointment with your PCP, Dr. Tarry Kos. This appointment will be for your diabetes and other health conditions.  Contact information: [address] [place] [address] [phone]                 Lattie Haw, MD 09/07/2020, 4:20 PM PGY-2, [place]

## 2020-09-07 NOTE — Progress Notes (Signed)
   Left great toe amputation site healing well.  He is okay for discharge from vascular surgery standpoint.  He will follow-up in a few weeks for suture removal.  Pain medicine prescription was given and filled yesterday by transitional pharmacy.  No other medications necessary from surgical standpoint.  Abdo Denault C. Donzetta Matters, MD [facility] Office: [phone] Pager: [phone]

## 2020-09-09 LAB — AEROBIC/ANAEROBIC CULTURE W GRAM STAIN (SURGICAL/DEEP WOUND): Gram Stain: NONE SEEN

## 2020-09-09 MED FILL — FREESTYLE LITE TEST STRIP: 30 days supply | Qty: 100 | Fill #1

## 2020-09-09 MED FILL — DEXCOM G6 TRANSMITTER MISC: 90 days supply | Qty: 1 | Fill #2

## 2020-09-10 ENCOUNTER — Other Ambulatory Visit: Payer: Self-pay | Admitting: *Deleted

## 2020-09-10 ENCOUNTER — Encounter: Payer: Self-pay | Admitting: *Deleted

## 2020-09-10 NOTE — Patient Outreach (Signed)
[facility] Care Management  09/10/2020  Ashley RoyaltyShannon C Puello 02/20/1971 [id number]   Transition of care call/case closure   Referral received:09/09/20 Initial outreach:09/10/20 Insurance: [employer]    Subjective: Initial successful telephone call to patient's preferred number in order to complete transition of care assessment; 2 HIPAA identifiers verified. Explained purpose of call and completed transition of care assessment.  Carollee HerterShannon states that he is doing okay, denies post-operative problems, says surgical incisions are unremarkable with sutures in place,some swelling denies increase, he states no increase in redness at site or fever.  He states applying dry dressing at times. He states keeping foot leg elevated when resting in chair. He  states surgical pain well managed with prescribed medications. He reports tolerating mobility in home, has Darco shoe to right and left foot, has cane for use .  He discussed having prior to admission appointment with [employer] to fitting of prosthetic shoe for right foot, and plans to reschedule appointment. He reports tolerating diet, denies bowel or bladder problems. He states that his wife is a Engineer, civil (consulting)nurse and assisting him in recovery.  Patient reports monitoring blood sugars with use of Dexcom monitor reading 120-210, he acknowledges recent A1c 7.9 % as decrease from previous readings. Patient admits to continuing to smoke, discussed smoking cessation and resources through [employer] employee benefits.    Reviewed accessing the following [employer] Benefits : Discussed ongoing health issues of Diabetes, COPD, smoker  he is agreeable to a referral to one of the [place] chronic disease management programs.  Spoke with patient wife she states that she does have  have the hospital indemnity, provided contact number to [employer] to file claim if needed.  [facility].   He denies  educational needs related to staying safe during the COVID 19 pandemic.    Objective:  Mr. Lolly MustacheShannon Sledd [facility]/23-12/25/21 for Cellulitis left foot, left great toe amputation   Comorbidities include: Diabetes type 2 A1c 7.9%, neuropathy, hyperlipidemia, anxiety, hx of amputation of midfoot and transmetatarsal of right foot, Asthma, COPD, current smoker .  He  was discharged to home on 12/25/21without the need for home health services or DME.   Assessment:  Patient voices good understanding of all discharge instructions.  See transition of care flowsheet for assessment details.   Plan:  Reviewed hospital discharge diagnosis of Left foot cellulitis, left great toe amputation and discharge treatment plan using hospital discharge instructions, assessing medication adherence, reviewing problems requiring provider notification, and discussing the importance of follow up with surgeon, primary care provider and/or specialists as directed. Reinforced importance of glucose control, smoking cessation to help with healing .   Reviewed [place] healthy lifestyle program information to receive discounted premium for  2023, with wife. Step 1: Get  your annual physical  Step 2: Complete your health assessment  Step 3:Identify your current health status and complete the corresponding action step between January 1, and May 15, 2021.    Using [employer] ActiveAdvice View website,with patient consent enrolled to   participate in 's [employer] chronic disease management program.  Provided patient with Active Health Management contact number.   No ongoing care management needs identified so will close case to [facility] Care Management services and route successful outreach letter with [facility] Care Management pamphlet and 24 Hour Nurse Line Magnet to [facility] Care [facility] clinical  pool  to be mailed to patient's home address.    Egbert Garibaldi, RN, BSN  [facility] Care Management,Care Management Coordinator  [phone]- Mobile [phone]- Toll Free Main Office

## 2020-09-12 ENCOUNTER — Ambulatory Visit: Payer: 59 | Admitting: Family Medicine

## 2020-09-19 MED FILL — PROPRANOLOL HCL ER 80 MG CP: 80 | 30 days supply | Qty: 30 | Fill #1

## 2020-09-23 ENCOUNTER — Other Ambulatory Visit: Payer: Self-pay

## 2020-09-23 ENCOUNTER — Ambulatory Visit: Payer: 59 | Admitting: Family Medicine

## 2020-09-23 ENCOUNTER — Encounter: Payer: Self-pay | Admitting: Family Medicine

## 2020-09-23 VITALS — BP 117/75 | HR 77 | Ht 75.0 in | Wt 255.4 lb

## 2020-09-23 DIAGNOSIS — L03116 Cellulitis of left lower limb: Secondary | ICD-10-CM

## 2020-09-23 DIAGNOSIS — Z23 Encounter for immunization: Secondary | ICD-10-CM

## 2020-09-23 DIAGNOSIS — Q8901 Asplenia (congenital): Secondary | ICD-10-CM

## 2020-09-23 DIAGNOSIS — G252 Other specified forms of tremor: Secondary | ICD-10-CM

## 2020-09-23 NOTE — Assessment & Plan Note (Signed)
Meningococcal vaccine (Menveo) given today. He will be due for booster in >8 weeks (plan for booster at follow up visit).  Also due for MenB vaccine however do not currently have supplied in clinic.

## 2020-09-23 NOTE — Assessment & Plan Note (Signed)
Chronic, improving with Propranolol. - continue Propranolol 80mg  QD

## 2020-09-23 NOTE — Progress Notes (Signed)
   Subjective:   Patient ID: Christopher Fernandez    DOB: 1970/11/11, 50 y.o. male   MRN: [id number]  Christopher Fernandez is a 50 y.o. male with a history of asthma, exocrine pancreatic insufficiency, GERD, history of diabetic foot infection status post multiple amputations, hyperlipidemia, uncontrolled diabetes, action tremor, history of alcohol use disorder in remission, anemia, anxiety/depression, asplenia, cough history of MRSA infection, tobacco abuse disorder here for tremor and right foot infection and cellulitis  Tremors: Was seen on 08/19/20 for right hand tremors thought to be an essential tremor. He was started on Propranolol 80mg  daily. Endorses improvement in handwriting. Denies any side effects or issues with the medications.  Left foot Cellulitis/Osteomyelitis s/p left great toe amputation on 12/24 by vascular surgery without complications. He received IV antibiotics in hospital but not discharged with any antibiotics. Pain has been well managed with ibuprofen. Follow up scheduled with Vascular Surgery on 10/01/20.  Health maintenance: covid vaccine today  Review of Systems:  Per HPI.   Objective:   BP 117/75   Pulse 77   Ht 6\' 3"  (1.905 m)   Wt 255 lb 6.4 oz (115.8 kg)   SpO2 98%   BMI 31.92 kg/m  Vitals and nursing note reviewed.  General: pleasant older gentleman, sitting comfortably in exam chair, well nourished, well developed, in no acute distress with non-toxic appearance Resp: breathing comfortably on room air, speaking in full sentences Skin: warm, dry, Left great toe with superficial sutures intact with some deshesense on lateral most aspect with good secondary healing appreciated, otherwise edges with good approximation, no discharge or bleeding or discharge, no warmth or erythema Extremities: warm and well perfused, 2+ pedal and posterior tibial pulses appreciated bilaterally Neuro: Alert and oriented, speech normal  Assessment & Plan:   Asplenia Meningococcal vaccine  (Menveo) given today. He will be due for booster in >8 weeks (plan for booster at follow up visit).  Also due for MenB vaccine however do not currently have supplied in clinic.   Action tremor Chronic, improving with Propranolol. - continue Propranolol 80mg  QD  Cellulitis of left foot Resolved. S/p left great toe amputation by vascular surgery on 40/98 without complications. Well healing on exam.  Follow up with [facility]VS on 10/01/20 as scheduled  Health Maintenance: COVID booster given today  Christopher Marble, DO PGY-3, [facility] 09/23/2020 2:11 PM

## 2020-09-23 NOTE — Addendum Note (Signed)
Addended by: Leonia Corona R on: 09/23/2020 02:23 PM   Modules accepted: Orders

## 2020-09-23 NOTE — Patient Instructions (Signed)
It was a pleasure to see you today!  Thank you for choosing [facility] for your primary care.   Our plans for today were:  You received your meningococcal vaccine and Covid booster today  You will be due for your booster Meningococcal vaccine in March  Please follow up in March for diabetes follow up  Please be sure to follow up with vascular surgery as scheduled    To keep you healthy, please keep in mind the following health maintenance items that you are due for:   1. Colonoscopy - please schedule at earliest convenience 2.  Diabetic eye exam - Please schedule diabetic eye exam at earliest Hart, DO

## 2020-09-23 NOTE — Assessment & Plan Note (Signed)
Resolved. S/p left great toe amputation by vascular surgery on 75/10 without complications. Well healing on exam.  Follow up with [facility] on 10/01/20 as scheduled

## 2020-09-30 ENCOUNTER — Other Ambulatory Visit: Payer: Self-pay | Admitting: Family Medicine

## 2020-09-30 DIAGNOSIS — Z794 Long term (current) use of insulin: Secondary | ICD-10-CM

## 2020-09-30 DIAGNOSIS — E1165 Type 2 diabetes mellitus with hyperglycemia: Secondary | ICD-10-CM

## 2020-10-01 ENCOUNTER — Other Ambulatory Visit: Payer: Self-pay | Admitting: Family Medicine

## 2020-10-01 MED FILL — GABAPENTIN 300 MG CAPSULE: 300 | 30 days supply | Qty: 90 | Fill #0

## 2020-10-07 ENCOUNTER — Other Ambulatory Visit: Payer: Self-pay

## 2020-10-07 ENCOUNTER — Ambulatory Visit (INDEPENDENT_AMBULATORY_CARE_PROVIDER_SITE_OTHER): Payer: 59 | Admitting: Physician Assistant

## 2020-10-07 VITALS — BP 117/79 | HR 77 | Temp 98.8°F | Resp 20 | Ht 75.0 in | Wt 262.2 lb

## 2020-10-07 DIAGNOSIS — S98112A Complete traumatic amputation of left great toe, initial encounter: Secondary | ICD-10-CM

## 2020-10-07 NOTE — Progress Notes (Signed)
POST OPERATIVE OFFICE NOTE    CC:  F/u for surgery  HPI:  This is a 50 y.o. 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 Donzetta Matters.    Pt returns today for follow up.  He states he is having some swelling just below the toe amputation site.  He's not having much pain in the incision area.  He states he has some soreness around the top of the foot and the ankle area and was worried there was infection.  He denies any fever or chills.  There is not redness around the area.  He states he still has some numbness in his left heel after the nerve block for his surgery.     Allergies  Allergen Reactions  . Eggs Or Egg-Derived Products Rash  . Morphine And Related Other (See Comments)    Cant take because of pancreatitis  . Cocoa Rash    Current Outpatient Medications  Medication Sig Dispense Refill  . acetaminophen (TYLENOL 8 HOUR) 650 MG CR tablet Take 1 tablet (650 mg total) by mouth every 8 (eight) hours as needed for pain.    [name] albuterol (VENTOLIN HFA) 108 (90 Base) MCG/ACT inhaler Inhale 2-4 puffs into the lungs every 4 (four) hours as needed for wheezing (or cough). (Patient taking differently: Inhale 2 puffs into the lungs every 4 (four) hours as needed for wheezing.) 6.7 g 11  . ARIPiprazole (ABILIFY) 5 MG tablet TAKE 1 TABLET (5 MG TOTAL) BY MOUTH DAILY. 90 tablet 0  . atorvastatin (LIPITOR) 40 MG tablet Take 1 tablet (40 mg total) by mouth daily. 90 tablet 3  . bismuth subsalicylate (PEPTO BISMOL) 262 MG/15ML suspension Take 30 mLs by mouth every 6 (six) hours as needed for indigestion or diarrhea or loose stools.    . Continuous Blood Gluc Receiver (DEXCOM G6 RECEIVER) DEVI 1 Device by Does not apply route 4 (four) times daily. 1 each 11  . Continuous Blood Gluc Transmit (DEXCOM G6 TRANSMITTER) MISC 4 (four) times daily. as directed    . diphenhydrAMINE (BENADRYL) 25 MG tablet Take 25 mg by mouth every 6 (six) hours as needed for itching or allergies.     .  DULoxetine (CYMBALTA) 60 MG capsule TAKE 1 CAPSULE (60 MG TOTAL) BY MOUTH DAILY. 90 capsule 0  . EASY TOUCH PEN NEEDLES 31G X 5 MM MISC Inject 1 application as directed 3 (three) times daily. 100 each 11  . empagliflozin (JARDIANCE) 25 MG TABS tablet Take 25 mg by mouth daily. 90 tablet 3  . FREESTYLE LITE test strip USE AS INSTRUCTED 100 strip 12  . gabapentin (NEURONTIN) 300 MG capsule TAKE 1 CAPSULE (300 MG TOTAL) BY MOUTH 3 (THREE) TIMES DAILY. 90 capsule 2  . glucose blood test strip USE AS INSTRUCTED    . glucose blood test strip USE AS INSTRUCTED    . HUMALOG KWIKPEN 100 UNIT/ML KwikPen INJECT 0-10 UNITS TOTAL INTO THE SKIN 3 TIMES DAILY WITH MEALS. (Patient taking differently: Inject 0-10 Units into the skin 3 (three) times daily. Sliding scale) 9 mL 0  . ibuprofen (ADVIL) 200 MG tablet Take 800 mg by mouth every 8 (eight) hours as needed for headache or moderate pain.     . Insulin Glargine (BASAGLAR KWIKPEN) 100 UNIT/ML INJECT 0.2 MLS (20 UNITS TOTAL) INTO THE SKIN IN THE MORNING. 9 mL 2  . Lancets (FREESTYLE) lancets Use as instructed 100 each 12  . metFORMIN (GLUCOPHAGE) 1000 MG tablet TAKE  1 TABLET (1,000 MG TOTAL) BY MOUTH 2 (TWO) TIMES DAILY WITH A MEAL. 180 tablet 3  . mirtazapine (REMERON) 30 MG tablet Take 1 tablet (30 mg total) by mouth at bedtime. 90 tablet 3  . montelukast (SINGULAIR) 10 MG tablet TAKE 1 TABLET (10 MG TOTAL) BY MOUTH AT BEDTIME. 90 tablet 0  . propranolol ER (INDERAL LA) 80 MG 24 hr capsule Take 1 capsule (80 mg total) by mouth daily. 30 capsule 1  . sitaGLIPtin (JANUVIA) 100 MG tablet Take 1 tablet (100 mg total) by mouth daily. 30 tablet 10   No current facility-administered medications for this visit.     ROS:  See HPI  Physical Exam:  Today's Vitals   10/07/20 0959  BP: 117/79  Pulse: 77  Resp: 20  Temp: 98.8 F (37.1 C)  TempSrc: Temporal  SpO2: 96%  Weight: 262 lb 3.2 oz (118.9 kg)  Height: 6\' 3"  (1.905 m)  PainSc: 4    Body mass index  is 32.77 kg/m.   Incision:  Healing nicely with sutures in tact.     Extremities:  Easily palpable left DP pulse.  There is no erythema or any other open wounds.  Swelling around the left great toe amputation site.    Assessment/Plan:  This is a 50 y.o. male who is s/p: Left great toe ray amputation   -incision looks good but there is some swelling just proximal to the great toe amp site.  He has not been wrapping the toe amp site, so I did wrap this with 2" ace followed by 4" ace around the foot and ankle.  Hopefully, this will help with some swelling.  He will continue to elevate his feet.  He does have a palpable left DP pulse.   -I left the sutures in today given swelling around site.  Will have him return on 2/4 in the PA clinic on Dr. Claretha Cooper clinic day for evaluation.  Pt is in agreement with this.   -he will continue to use his Darco shoe and continue showering daily with soap and water.   -numbness in heel after nerve block-discussed with pt that if this is still present when he returns, we can discuss with Dr. Donzetta Matters but hopeful this will improve.   Pt in agreement.   Leontine Locket, [facility] [phone]  Clinic MD:  Trula Slade

## 2020-10-16 ENCOUNTER — Encounter: Payer: Self-pay | Admitting: Family Medicine

## 2020-10-18 NOTE — Telephone Encounter (Signed)
Spoke to patient to inquire of further details of his symptoms. He notes that he feels like his throat is more swollen and feels it is harder to breath at night. Denies any pain with eating. Does note a sore in his mouth. Patient was breathing comfortably during phone call and speaking in full sentences. Recommended patient be seen for further evaluation as soon as he could as this does not sound typical for thrush. Recommended ED visit if worsening difficulty breathing. He declined appointment at time of call. Plans to call to schedule appointment at earliest convenience. Urged him to be seen sooner than later given his symptoms. He voiced understanding and agreement with plan.

## 2020-10-21 ENCOUNTER — Encounter: Payer: Self-pay | Admitting: Family Medicine

## 2020-10-22 NOTE — Progress Notes (Addendum)
Subjective:   Patient ID: Christopher Fernandez    DOB: 01/14/1971, 50 y.o. male   MRN: [id number]  Christopher Fernandez is a 50 y.o. male with a history of HTN, asthma, COPD, h/o exocrine pancreatic insufficiency, GERD, uncontrolled T2DM w/ h/o multiple toe amputations w/ h/o MRSA infection, HLD, BPH, abnormal ABIs, action tremor, alcohol use disorder, anemia, anxiety, ED, tobacco use disorder, h/o foreigners gangrene  here for abdominal pain  Abdominal Pain: Patient endorses development of mid abdominal cramping about 1-1/2 to 2 weeks ago.  He notes the pain is now radiating to the back, improves with leaning forward.  Since last week the pain has become more intense and constant.  He is since developed nausea and intermittent episodes of vomiting.  He does note that he has been able to stay hydrated.  The back pain is located in the middle of his back bilaterally.  He denies any dysuria, hematuria, fevers, chills.  Denies any alcohol use.  Does endorse increased frequency with little output.  He feels overall weak and not well.  He notes that he has been sweating very easily.  He has chronic irregular bowel movements that range from being soft to loose/fluid.  They range in color from light to brown, sometimes green.  He notes that they were black this morning.  He has tried treating the pain with ibuprofen but has not had no improvement.  Denies any recent travel.  Denies any changes in routine or diet. Per patient he has a long history of chronic pancreatitis in setting of alcohol use.  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 None of those records are available today, having been done in [place] , [place]. Not on prophylactic antibiotics. Follows with [facility]. Was on Creon 72,000 units with meals. Last seen in 2019. He was supposed to follow up in 6 weeks but never did. Not currently on any Pancreatitic enzymes.    Concern for Thrush Patient notes that he has been waking up gasping for air at night for about 2 weeks. Denies difficulty breathing when lying flat however. He notes that this is a common presentation when he developed thrush.  He notes he has a history of thrush for many years.  Which is often treated with nystatin solution.  He endorses general soreness lower in his neck/throat but denies any difficulty swallowing or pain with swallowing.  He notes that he does snore for many years.  Denies lower extremity swelling.  Denies any recent antibiotics other than the ones he received in the hospital. Notes that he has been brushing his teeth   Review of Systems:  Per HPI.   Objective:   BP 122/70   Pulse 88   Temp 99.4 F (37.4 C)   Ht 6\' 3"  (1.905 m)   Wt 253 lb (114.8 kg)   SpO2 99%   BMI 31.62 kg/m  Vitals and nursing note reviewed.  General: pleasant older male, sitting comfortably in exam chair, sweat present all over forehead,  Speaking in full sentences, able to stand up, walk around, and get onto exam bed without difficulty, does not appear in acute distress HEENT: normocephalic, atraumatic, moist mucous membranes, oropharynx clear without erythema or exudate, no areas of white patches on tongue or buccal mucosa, throat clear and nonedematous  Neck: supple, non-tender without lymphadenopathy, bilaterally palpable submandibular lymph nodes  CV: regular rate and rhythm without murmurs, rubs, or gallops,  no lower extremity edema, 2+ radial and pedal pulses bilaterally Lungs: clear to auscultation bilaterally with normal work of breathing on room air, speaking in full sentences Abdomen: soft, tender to epigastric area, nondistended, no masses or organomegaly palpable, normoactive bowel sounds Skin: warm and clammy Extremities: warm and well perfused, normal tone MSK:  gait normal, pain along mid back bilaterally but not worsened by CVA testing  Neuro: Alert and oriented, speech  normal  Assessment & Plan:   Epigastric pain Acute. Most concerning differential includes acute on chronic pancreatitis vs ulcer/gastric perforation given endorsement of melena. Mesenteric ischemia considered however clinically did not appear consistent at this time. Less concern for MI. Other possible differentials include GERD, gastritis, dyspepsia. Patient denies alcohol use.  Patient is currently hemodynamically stable and able to ambulate without difficulty. Nonsurgical abdomen on exam. He does appear unwell, and with his history of asplenia, I have low threshold for ED evaluation if worsening. This was discussed profusely with patient. Will obtained STAT imaging and labs to further evaluate. UA negative for infection or blood making nephrolithiasis, UTI, and pyelonephritis unlikely cause. - STAT CT abdomen/pelvis with contrast - CBC with diff, CMP, Lipase, lipid panel, UA with microscopy and urine culture - Schedule Tylenol 650mg  q6 hours and Ibuprofen 600mg  q 6 hours (alternating) with Tramadol 50-100mg  Q8 hours PRN for break through pain - encouraged adequate hydration - Strict ED precautions discussed   Paroxysmal nocturnal dyspnea Patient endorses relationship between PND symptoms and past infections of candidal thrush. No findings on exam. Denies orthopnea. Long history of snoring. No prior sleep study in chart. Unfortunately, this was not able to be fully addressed due to acute concern above. CXR and BNP to further evaluate for heart failure. Consider sleep study in future. May need EGD or referral to ENT for scoping. Low suspicion for thrush, however given low risk treatment and patient's history of thrush and uncontrolled diaetes, will empirically treat with Nystatin. However this will need further evaluation and recommend further workup going forward.  Orders Placed This Encounter  Procedures  . Urine Culture  . CT Abdomen Pelvis W Contrast    History of pancreatitis with  pseudocysts, s/p cholecystectomy and splenectomy  Epigastric pain with radiation to back, dark stools Patient can leave after scan per provider    Standing Status:   Future    Number of Occurrences:   1    Standing Expiration Date:   10/23/2021    Order Specific Question:   If indicated for the ordered procedure, I authorize the administration of contrast media per Radiology protocol    Answer:   Yes    Order Specific Question:   Preferred imaging location?    Answer:   [facility]    Order Specific Question:   Is Oral Contrast requested for this exam?    Answer:   Yes, Per Radiology protocol    Order Specific Question:   Call Results- Best Contact Number?    Answer:   [phone]  . DG Chest 2 View    Standing Status:   Future    Standing Expiration Date:   10/23/2021    Order Specific Question:   Reason for Exam (SYMPTOM  OR DIAGNOSIS REQUIRED)    Answer:   orthopnea    Order Specific Question:   Preferred imaging location?    Answer:   [facility]  . Lipase  . CBC with Differential  . Brain natriuretic peptide  . Lipid Panel  . Comprehensive  metabolic panel  . POCT UA - Microscopic Only  . POCT urinalysis dipstick  . POCT glycosylated hemoglobin (Hb A1C)   Meds ordered this encounter  Medications  . propranolol ER (INDERAL LA) 80 MG 24 hr capsule    Sig: Take 1 capsule (80 mg total) by mouth daily.    Dispense:  90 capsule    Refill:  1  . traMADol (ULTRAM) 50 MG tablet    Sig: Take 1 tablet (50 mg total) by mouth every 8 (eight) hours as needed for up to 5 days.    Dispense:  15 tablet    Refill:  0  . nystatin (MYCOSTATIN) 100000 UNIT/ML suspension    Sig: Take 5 mLs (500,000 Units total) by mouth 4 (four) times daily for 10 days.    Dispense:  60 mL    Refill:  0  . pantoprazole (PROTONIX) 40 MG tablet    Sig: Take 1 tablet (40 mg total) by mouth daily.    Dispense:  30 tablet    Refill:  0   Mina Marble, DO PGY-3, [facility] 10/23/2020 5:40 PM

## 2020-10-23 ENCOUNTER — Other Ambulatory Visit: Payer: Self-pay | Admitting: Family Medicine

## 2020-10-23 ENCOUNTER — Ambulatory Visit (HOSPITAL_COMMUNITY)
Admission: RE | Admit: 2020-10-23 | Discharge: 2020-10-23 | Disposition: A | Discharge status: Home / Self Care | Payer: 59 | Source: Ambulatory Visit | Attending: Family Medicine | Admitting: Family Medicine

## 2020-10-23 ENCOUNTER — Encounter: Payer: Self-pay | Admitting: Family Medicine

## 2020-10-23 ENCOUNTER — Encounter (HOSPITAL_COMMUNITY): Payer: Self-pay

## 2020-10-23 ENCOUNTER — Other Ambulatory Visit: Payer: Self-pay

## 2020-10-23 ENCOUNTER — Ambulatory Visit (INDEPENDENT_AMBULATORY_CARE_PROVIDER_SITE_OTHER): Payer: 59 | Admitting: Family Medicine

## 2020-10-23 VITALS — BP 122/70 | HR 88 | Temp 99.4°F | Ht 75.0 in | Wt 253.0 lb

## 2020-10-23 DIAGNOSIS — Z794 Long term (current) use of insulin: Secondary | ICD-10-CM

## 2020-10-23 DIAGNOSIS — R112 Nausea with vomiting, unspecified: Secondary | ICD-10-CM | POA: Diagnosis not present

## 2020-10-23 DIAGNOSIS — Z9889 Other specified postprocedural states: Secondary | ICD-10-CM | POA: Diagnosis not present

## 2020-10-23 DIAGNOSIS — K8689 Other specified diseases of pancreas: Secondary | ICD-10-CM | POA: Diagnosis not present

## 2020-10-23 DIAGNOSIS — K861 Other chronic pancreatitis: Secondary | ICD-10-CM | POA: Diagnosis not present

## 2020-10-23 DIAGNOSIS — K921 Melena: Secondary | ICD-10-CM

## 2020-10-23 DIAGNOSIS — E1165 Type 2 diabetes mellitus with hyperglycemia: Secondary | ICD-10-CM

## 2020-10-23 DIAGNOSIS — K429 Umbilical hernia without obstruction or gangrene: Secondary | ICD-10-CM | POA: Diagnosis not present

## 2020-10-23 DIAGNOSIS — Z8719 Personal history of other diseases of the digestive system: Secondary | ICD-10-CM | POA: Diagnosis not present

## 2020-10-23 DIAGNOSIS — R0600 Dyspnea, unspecified: Secondary | ICD-10-CM | POA: Diagnosis not present

## 2020-10-23 DIAGNOSIS — G252 Other specified forms of tremor: Secondary | ICD-10-CM | POA: Diagnosis not present

## 2020-10-23 DIAGNOSIS — R0601 Orthopnea: Secondary | ICD-10-CM | POA: Diagnosis not present

## 2020-10-23 DIAGNOSIS — B370 Candidal stomatitis: Secondary | ICD-10-CM

## 2020-10-23 DIAGNOSIS — R10A0 Flank pain, unspecified side: Secondary | ICD-10-CM

## 2020-10-23 DIAGNOSIS — R1013 Epigastric pain: Secondary | ICD-10-CM | POA: Diagnosis not present

## 2020-10-23 DIAGNOSIS — R109 Unspecified abdominal pain: Secondary | ICD-10-CM | POA: Diagnosis not present

## 2020-10-23 LAB — COMPREHENSIVE METABOLIC PANEL
ALT: 33 IU/L (ref 0–44)
AST: 13 IU/L (ref 0–40)
Albumin/Globulin Ratio: 2 (ref 1.2–2.2)
Albumin: 4.5 g/dL (ref 3.8–4.9)
Alkaline Phosphatase: 112 IU/L (ref 44–121)
BUN/Creatinine Ratio: 18 (ref 9–20)
BUN: 14 mg/dL (ref 6–24)
Bilirubin Total: 0.4 mg/dL (ref 0.0–1.2)
CO2: 20 mmol/L (ref 20–29)
Calcium: 9.6 mg/dL (ref 8.7–10.2)
Chloride: 98 mmol/L (ref 96–106)
Creatinine, Ser: 0.8 mg/dL (ref 0.76–1.27)
GFR calc Af Amer: 120 mL/min/{1.73_m2} (ref >59)
GFR calc non Af Amer: 103 mL/min/{1.73_m2} (ref >59)
Globulin, Total: 2.3 g/dL (ref 1.5–4.5)
Glucose: 196 mg/dL — ABNORMAL HIGH (ref 65–99)
Potassium: 5 mmol/L (ref 3.5–5.2)
Sodium: 131 mmol/L — ABNORMAL LOW (ref 134–144)
Total Protein: 6.8 g/dL (ref 6.0–8.5)

## 2020-10-23 LAB — POCT URINALYSIS DIP (MANUAL ENTRY)
Bilirubin, UA: NEGATIVE
Blood, UA: NEGATIVE
Glucose, UA: >=1000 mg/dL — AB
Ketones, POC UA: NEGATIVE mg/dL
Leukocytes, UA: NEGATIVE
Nitrite, UA: NEGATIVE
Protein Ur, POC: NEGATIVE mg/dL
Spec Grav, UA: 1.015 (ref 1.010–1.025)
Urobilinogen, UA: 0.2 E.U./dL
pH, UA: 5.5 (ref 5.0–8.0)

## 2020-10-23 LAB — POCT GLYCOSYLATED HEMOGLOBIN (HGB A1C): Hemoglobin A1C: 9.7 % — AB (ref 4.0–5.6)

## 2020-10-23 LAB — POCT UA - MICROSCOPIC ONLY

## 2020-10-23 MED ORDER — NYSTATIN 100000 UNIT/ML MT SUSP: 5.0000 mL | Freq: Four times a day (QID) | OROMUCOSAL | 0 refills | Status: DC

## 2020-10-23 MED ORDER — TRAMADOL HCL 50 MG PO TABS: 50.0000 mg | ORAL_TABLET | Freq: Three times a day (TID) | ORAL | 0 refills | Status: DC | PRN

## 2020-10-23 MED ORDER — IOHEXOL 300 MG/ML  SOLN
100.0000 mL | Freq: Once | INTRAMUSCULAR | Status: AC | PRN
  Administered 2020-10-23: 100 mL via INTRAVENOUS

## 2020-10-23 MED ORDER — PANTOPRAZOLE SODIUM 40 MG PO TBEC: 40.0000 mg | DELAYED_RELEASE_TABLET | Freq: Every day | ORAL | 0 refills | Status: DC

## 2020-10-23 MED ORDER — PROPRANOLOL HCL ER 80 MG PO CP24
80.0000 mg | ORAL_CAPSULE | Freq: Every day | ORAL | 1 refills | Status: DC
Start: 2020-10-23 — End: 2020-10-23

## 2020-10-23 MED FILL — NYSTATIN 100000 UNIT/ML SUS: 100000 | 10 days supply | Qty: 200 | Fill #0

## 2020-10-23 MED FILL — PROPRANOLOL HCL ER 80 MG CP: 80 | 90 days supply | Qty: 90 | Fill #0

## 2020-10-23 MED FILL — traMADol HCL 50 MG TABS: 50 | 5 days supply | Qty: 15 | Fill #0

## 2020-10-23 NOTE — Assessment & Plan Note (Addendum)
Patient endorses relationship between PND symptoms and past infections of candidal thrush. No findings on exam. Denies orthopnea. Long history of snoring. No prior sleep study in chart. Unfortunately, this was not able to be fully addressed due to acute concern above. CXR and BNP to further evaluate for heart failure. Consider sleep study in future. May need EGD or referral to ENT for scoping. Low suspicion for thrush, however given low risk treatment and patient's history of thrush and uncontrolled diaetes, will empirically treat with Nystatin. However this will need further evaluation and recommend further workup going forward.

## 2020-10-23 NOTE — Patient Instructions (Addendum)
It was a pleasure to see you today!  Thank you for choosing [facility] for your primary care.   Our plans for today were:  I have obtained labs and imaging! I will call you with the results and next steps  Be sure to stay well hydrated. Avoid alcohol.  Take 600mg  Ibuprofen and 650mg  Tylenol alternating every 3 hours. You can take Tramadol 50-100mg  as needed for break through pain.   Reasons to go to the ED: ANY fevers or chills, inability to stay hydrated/vomiting, worsening pain, lightheadedness, dizziness, chest pain, shortness of breath   Follow up on 10/25/20 at 10:10 am    Your CT is today @ 3pm at [facility] radiology.   You will need to arrive @ 2:45.  Please go now to pick up your contrast.  You will drink the first bottle @ 1pm and the 2nd @ 2pm.  Call [phone] with questions about your CT  Best Wishes,   Mina Marble, DO

## 2020-10-23 NOTE — Assessment & Plan Note (Addendum)
Acute. Most concerning differential includes acute on chronic pancreatitis vs ulcer/gastric perforation given endorsement of melena. Mesenteric ischemia considered however clinically did not appear consistent at this time. Less concern for MI. Other possible differentials include GERD, gastritis, dyspepsia. Patient denies alcohol use.  Patient is currently hemodynamically stable and able to ambulate without difficulty. Nonsurgical abdomen on exam. He does appear unwell, and with his history of asplenia, I have low threshold for ED evaluation if worsening. This was discussed profusely with patient. Will obtained STAT imaging and labs to further evaluate. UA negative for infection or blood making nephrolithiasis, UTI, and pyelonephritis unlikely cause. - STAT CT abdomen/pelvis with contrast - CBC with diff, CMP, Lipase, lipid panel, UA with microscopy and urine culture - Schedule Tylenol 650mg  q6 hours and Ibuprofen 600mg  q 6 hours (alternating) with Tramadol 50-100mg  Q8 hours PRN for break through pain - encouraged adequate hydration - Strict ED precautions discussed

## 2020-10-23 NOTE — Addendum Note (Signed)
Addended by: Danna Hefty on: 10/23/2020 05:41 PM   Modules accepted: Orders

## 2020-10-24 LAB — LIPID PANEL
Chol/HDL Ratio: 2.5 ratio (ref 0.0–5.0)
Cholesterol, Total: 86 mg/dL — ABNORMAL LOW (ref 100–199)
HDL: 34 mg/dL — ABNORMAL LOW (ref >39)
LDL Chol Calc (NIH): 34 mg/dL (ref 0–99)
Triglycerides: 87 mg/dL (ref 0–149)
VLDL Cholesterol Cal: 18 mg/dL (ref 5–40)

## 2020-10-24 LAB — CBC WITH DIFFERENTIAL/PLATELET
Basophils Absolute: 0 10*3/uL (ref 0.0–0.2)
Basos: 0 %
EOS (ABSOLUTE): 0.1 10*3/uL (ref 0.0–0.4)
Eos: 1 %
Hematocrit: 43.4 % (ref 37.5–51.0)
Hemoglobin: 14.1 g/dL (ref 13.0–17.7)
Immature Grans (Abs): 0 10*3/uL (ref 0.0–0.1)
Immature Granulocytes: 0 %
Lymphocytes Absolute: 1.8 10*3/uL (ref 0.7–3.1)
Lymphs: 20 %
MCH: 28.4 pg (ref 26.6–33.0)
MCHC: 32.5 g/dL (ref 31.5–35.7)
MCV: 88 fL (ref 79–97)
Monocytes Absolute: 0.8 10*3/uL (ref 0.1–0.9)
Monocytes: 9 %
Neutrophils Absolute: 6.2 10*3/uL (ref 1.4–7.0)
Neutrophils: 70 %
Platelets: 297 10*3/uL (ref 150–450)
RBC: 4.96 x10E6/uL (ref 4.14–5.80)
RDW: 12.9 % (ref 11.6–15.4)
WBC: 9 10*3/uL (ref 3.4–10.8)

## 2020-10-24 LAB — LIPASE: Lipase: 6 U/L — ABNORMAL LOW (ref 13–78)

## 2020-10-24 LAB — BRAIN NATRIURETIC PEPTIDE: BNP: 28.9 pg/mL (ref 0.0–100.0)

## 2020-10-24 MED FILL — PANTOPRAZOLE SOD DR 40 MG T: 40 | 30 days supply | Qty: 30 | Fill #0

## 2020-10-24 NOTE — Progress Notes (Addendum)
Subjective:   Patient ID: Christopher Fernandez    DOB: May 11, 1971, 50 y.o. male   MRN: [id number]  Christopher Fernandez is a 50 y.o. male with a history of HTN, asthma, COPD/asthma, exocrine pancreatic insufficiency, GERD, diabetic neuropathy, HLD, uncontrolled T2DM, BPH, abnormal ABI, action tremor, alcohol use disorder, h/o amputation due to diabetes, anemia, anxiety/depression, asplenia, ED, h/o MRSA infection, RLS, tobacco use disorder here for epigastric pain follow up  Epigastric Pain Follow up: Patient returns today for epigastric pain that radiated to back. Work up including CT abd/pelvis, lipase, CBC, CMP, BNP, UA all normal. He was started on Protonix and recommended to treat pain with tylenol and tramadol. Today he notes that his abdominal has resolved but his pain continues in his back, primarily along his left side. It feels like it is "slowly creeping upward". Denies any further black stools. Denies any fevers/chills but has had a few episodes of sweats.   Review of Systems:  Per HPI.   Objective:   BP 112/75   Pulse 73   Ht 6\' 3"  (1.905 m)   Wt 256 lb 3.2 oz (116.2 kg)   SpO2 99%   BMI 32.02 kg/m  Vitals and nursing note reviewed.  General: pleasant older male, sitting comfortably in exam chair, well nourished, well developed, in no acute distress with non-toxic appearance CV: regular rate and rhythm without murmurs, rubs, or gallops Lungs: clear to auscultation bilaterally with normal work of breathing on room air, speaking in full sentences Abdomen: soft, non-tender, non-distended, normoactive bowel sounds Skin: warm, dry MSK:  Back: No gross deformity, scoliosis. Pain along left mid lateral however not worsened by palpation. FROM. Strength LEs 5/5 all muscle groups.  No CVA tenderness. Neuro: Alert and oriented, speech normal  Assessment & Plan:   Chronic pancreatitis ([place]) Acute on chronic. Overall history, physical exam, and work up appears most consistent with acute flare  of chronic pancreatitis. CT abd/pelvis notable for calcified pancreas without any acute findings, Lipase low at 6, CBC w/ diff and CMP otherwise normal except for mild hyponatremia. UA and culture negative ruling out kidney etiology. At this time, appears symptoms are slowly improving. Will focus management on analgesia and providing pancreatic exocrine hormones. - Continue Tylenol 650mg  Q6 hours + short course of tramadol 50-100mg  PRN - Start Creon 36000: 1 tablet with each meal - GI referral placed - Continue Gabapentin as prescribed - CXR to rule out pulmonary cause (physical exam benign) and finalize work up - can trial topical analgesics if more pain control is needed - Follow up on 11/13/20 for continued monitoring  - follow up with Dr. Valentina Lucks on 2/17 and Dr. Tarry Kos on 3/2 for diabetes management - continue to avoid alochol  Action tremor Notes improvement with propranolol 80mg  QD. Notes insurance will no longer cover long acting Propranolol. Patient okay for next 3 months. Based on research, propranolol and metoprolol were equally as effective in alleviating symptoms. Will transition to Metoprolol 100mg  QD when needed.   Exocrine pancreatic insufficiency Plan as above.  Hyponatremia Acute. Na 131 on last BMP.  Incidental finding.  BMP today to monitor. Further work up if persists.   S/P transmetatarsal amputation of foot, right (HCC) Chronic. Endorses moments of unstable gait due to amputations. Desires cane for improved ambulation - rx for cane provided. Message sent to RN team  Asplenia Chronic. 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 No records in chart. 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 This was discussed with attending Dr. Owens Shark and online consultant and it was determine that these do not provide adequate protection from encapsulated bacteria and he is to be vaccinated regularly for protection.  - Up to date  on all vaccinations except for Zoster vaccine. Plan to discuss at follow up visit in March.  Orders Placed This Encounter  Procedures  . Cane adjustable wide base quad  . Basic Metabolic Panel  . Ambulatory referral to Gastroenterology    Referral Priority:   Routine    Referral Type:   Consultation    Referral Reason:   Specialty Services Required    Number of Visits Requested:   1   No orders of the defined types were placed in this encounter.  Follow up: 10/31/20 with Dr. Valentina Lucks for diabetes management. Follow up 11/13/20 with Dr. Tarry Kos for continued monitoring and diabetes follow up.  Mina Marble, DO PGY-3, [facility] 10/27/2020 11:29 AM

## 2020-10-25 ENCOUNTER — Ambulatory Visit: Payer: 59 | Admitting: Family Medicine

## 2020-10-25 ENCOUNTER — Other Ambulatory Visit: Payer: Self-pay

## 2020-10-25 VITALS — BP 112/75 | HR 73 | Ht 75.0 in | Wt 256.2 lb

## 2020-10-25 DIAGNOSIS — G252 Other specified forms of tremor: Secondary | ICD-10-CM | POA: Diagnosis not present

## 2020-10-25 DIAGNOSIS — R2681 Unsteadiness on feet: Secondary | ICD-10-CM

## 2020-10-25 DIAGNOSIS — K8681 Exocrine pancreatic insufficiency: Secondary | ICD-10-CM | POA: Diagnosis not present

## 2020-10-25 DIAGNOSIS — Z89431 Acquired absence of right foot: Secondary | ICD-10-CM | POA: Diagnosis not present

## 2020-10-25 DIAGNOSIS — E871 Hypo-osmolality and hyponatremia: Secondary | ICD-10-CM

## 2020-10-25 DIAGNOSIS — Q8901 Asplenia (congenital): Secondary | ICD-10-CM | POA: Diagnosis not present

## 2020-10-25 DIAGNOSIS — K861 Other chronic pancreatitis: Secondary | ICD-10-CM | POA: Diagnosis not present

## 2020-10-25 DIAGNOSIS — Z1211 Encounter for screening for malignant neoplasm of colon: Secondary | ICD-10-CM | POA: Diagnosis not present

## 2020-10-25 LAB — URINE CULTURE: Organism ID, Bacteria: NO GROWTH

## 2020-10-25 NOTE — Patient Instructions (Signed)
It was a pleasure to see you today!  Thank you for choosing [facility] for your primary care.   Our plans for today were:  At this time, I feel your pain may be from a flare of chronic pancreatitis. Reassuringly all of the rest of your labs and imaging are negative.  pleae get the Chest X-ray to finish up the work up  Please start taking Creon: 1 tablet with every meal  I have referred you to GI for further management  Please continue to stay hydrated  Take Tylenol for pain with Tramadol as needed  Follow up with Dr. Valentina Lucks on 10/31/20 for diabetes management  Follow up with me on 11/13/20 for check up and diabetes  Please return or report to ED if your pain worsens, develop vomiting/nausea with inability to stay hydrated, fevers/chills, chest pain, shortness of breath, dark or bloody stools.   Best Wishes,   Mina Marble, DO

## 2020-10-27 DIAGNOSIS — K861 Other chronic pancreatitis: Secondary | ICD-10-CM | POA: Insufficient documentation

## 2020-10-27 DIAGNOSIS — E871 Hypo-osmolality and hyponatremia: Secondary | ICD-10-CM | POA: Insufficient documentation

## 2020-10-27 NOTE — Assessment & Plan Note (Signed)
Plan as above.  

## 2020-10-27 NOTE — Assessment & Plan Note (Signed)
Notes improvement with propranolol 80mg  QD. Notes insurance will no longer cover long acting Propranolol. Patient okay for next 3 months. Based on research, propranolol and metoprolol were equally as effective in alleviating symptoms. Will transition to Metoprolol 100mg  QD when needed.

## 2020-10-27 NOTE — Assessment & Plan Note (Addendum)
Acute on chronic. Overall history, physical exam, and work up appears most consistent with acute flare of chronic pancreatitis. CT abd/pelvis notable for calcified pancreas without any acute findings, Lipase low at 6, CBC w/ diff and CMP otherwise normal except for mild hyponatremia. UA and culture negative ruling out kidney etiology. At this time, appears symptoms are slowly improving. Will focus management on analgesia and providing pancreatic exocrine hormones. - Continue Tylenol 650mg  Q6 hours + short course of tramadol 50-100mg  PRN - Start Creon 36000: 1 tablet with each meal - GI referral placed - Continue Gabapentin as prescribed - CXR to rule out pulmonary cause (physical exam benign) and finalize work up - can trial topical analgesics if more pain control is needed - Follow up on 11/13/20 for continued monitoring  - follow up with Dr. Valentina Lucks on 2/17 and Dr. Tarry Kos on 3/2 for diabetes management - continue to avoid alochol

## 2020-10-27 NOTE — Assessment & Plan Note (Signed)
Chronic. 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 No records in chart. 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 This was discussed with attending Dr. Owens Shark and online consultant and it was determine that these do not provide adequate protection from encapsulated bacteria and he is to be vaccinated regularly for protection.  - Up to date on all vaccinations except for Zoster vaccine. Plan to discuss at follow up visit in March.

## 2020-10-27 NOTE — Assessment & Plan Note (Signed)
Acute. Na 131 on last BMP.  Incidental finding.  BMP today to monitor. Further work up if persists.

## 2020-10-27 NOTE — Assessment & Plan Note (Signed)
Chronic. Endorses moments of unstable gait due to amputations. Desires cane for improved ambulation - rx for cane provided. Message sent to RN team

## 2020-10-29 ENCOUNTER — Other Ambulatory Visit: Payer: Self-pay

## 2020-10-29 ENCOUNTER — Encounter: Payer: Self-pay | Admitting: Pharmacist

## 2020-10-29 ENCOUNTER — Ambulatory Visit: Payer: 59 | Admitting: Pharmacist

## 2020-10-29 ENCOUNTER — Ambulatory Visit
Admission: RE | Admit: 2020-10-29 | Discharge: 2020-10-29 | Disposition: A | Discharge status: Home / Self Care | Payer: 59 | Source: Ambulatory Visit | Attending: Family Medicine | Admitting: Family Medicine

## 2020-10-29 DIAGNOSIS — E1165 Type 2 diabetes mellitus with hyperglycemia: Secondary | ICD-10-CM | POA: Diagnosis not present

## 2020-10-29 DIAGNOSIS — R0600 Dyspnea, unspecified: Secondary | ICD-10-CM

## 2020-10-29 DIAGNOSIS — E1169 Type 2 diabetes mellitus with other specified complication: Secondary | ICD-10-CM

## 2020-10-29 DIAGNOSIS — J449 Chronic obstructive pulmonary disease, unspecified: Secondary | ICD-10-CM | POA: Diagnosis not present

## 2020-10-29 DIAGNOSIS — R1013 Epigastric pain: Secondary | ICD-10-CM

## 2020-10-29 DIAGNOSIS — R0601 Orthopnea: Secondary | ICD-10-CM | POA: Diagnosis not present

## 2020-10-29 DIAGNOSIS — Z794 Long term (current) use of insulin: Secondary | ICD-10-CM

## 2020-10-29 DIAGNOSIS — E871 Hypo-osmolality and hyponatremia: Secondary | ICD-10-CM | POA: Diagnosis not present

## 2020-10-29 DIAGNOSIS — E785 Hyperlipidemia, unspecified: Secondary | ICD-10-CM

## 2020-10-29 MED ORDER — INSULIN LISPRO (1 UNIT DIAL) 100 UNIT/ML (KWIKPEN): PEN_INJECTOR | SUBCUTANEOUS | 0 refills | Status: DC

## 2020-10-29 MED ORDER — CETIRIZINE HCL 10 MG PO TABS: 10.0000 mg | ORAL_TABLET | Freq: Every day | ORAL | 11 refills | Status: DC

## 2020-10-29 MED FILL — GABAPENTIN 300 MG CAPSULE: 300 | 30 days supply | Qty: 90 | Fill #1

## 2020-10-29 NOTE — Assessment & Plan Note (Signed)
Asthma/COPD currently uncontrolled and complicated by recurrent thrush and cigarette smoking with >30 pack year history. Patient has previously tried using nicotine patches to quit/reduce his cigarette smoking but has no desire to quit at this time. He currently smokes about 1 pack per day.  -Patient was counseled on the importance of quitting and was provided support and encouragement if/when he decides to quit Patient reports using his albuterol inhaler every other day and Advair twice daily.  -May consider switching Advair 250/50 to Trelegy 100 for once daily control of symptoms and reduction in exposure to inhaled corticosteroids at next PCP visit 11/13/2020.

## 2020-10-29 NOTE — Progress Notes (Signed)
Reviewed: I agree with Dr. Koval's documentation and management. 

## 2020-10-29 NOTE — Progress Notes (Signed)
[email or web address]     S:     Chief Complaint  Patient presents with  . Medication Management    Diabetes     Presents for diabetes evaluation, education, and management. Patient ambulates with walking brace which is in place following recent foot amputation.  Patient was referred and last seen by Primary Care Provider on 10/25/2020.    Patient reports Diabetes was diagnosed ~16 years ago and pt recently underwent transmetatarsal amputation 08/2020 d/t vascular disease.   Medication adherence reported well.    Current diabetes medications include: empagliflozin 10mg  QD, insulin glargine 20units QAM, Humalog 0-10 units with meals, metformin 1000mg  BID Current hypertension medications include: n/a Current hyperlipidemia medications include: atorvastatin 40mg  QD  Patient reports hypoglycemic events. (lows ~70s, that occur mostly in the mornings) and blood sugars consistently in the high 100s-200s. Patient does have blood sugars in the 300s on a consistent basis as well.   Patient reported dietary habits: Eats 2-3 meals/day Breakfast:sometimes skips breakfast  Drinks:mostly water, sparkling water, coffee with sweet n low     O:  Physical Exam Vitals reviewed.  Constitutional:      Appearance: Normal appearance.  Pulmonary:     Effort: Pulmonary effort is normal.  Neurological:     Mental Status: He is alert.  Psychiatric:        Mood and Affect: Mood normal.        Behavior: Behavior normal.        Thought Content: Thought content normal.    Review of Systems  Gastrointestinal: Positive for abdominal pain.  All other systems reviewed and are negative.    Lab Results  Component Value Date   HGBA1C 9.7 (A) 10/23/2020   Vitals:   10/29/20 0913  BP: 120/82  Pulse: 73  SpO2: 97%    Lipid Panel     Component Value Date/Time   CHOL 86 (L) 10/23/2020 1016   TRIG 87 10/23/2020 1016   HDL 34 (L) 10/23/2020 1016   CHOLHDL 2.5 10/23/2020 1016   LDLCALC 34  10/23/2020 1016   LDLDIRECT 55 02/24/2018 1442     A/P: Diabetes longstanding currently uncontrolled. Patient is able to verbalize appropriate hypoglycemia management plan. Medication adherence appears adequate. Control is suboptimal due to lacking coverage with sliding scale insulin. -Decreased dose of basal insulin 18 units QAM (insulin glargine). -Increased dose of  rapid insulin (Humalog) to using 6-10 units per meal.  -Extensively discussed pathophysiology of diabetes, recommended lifestyle interventions, dietary effects on blood sugar control -Counseled on s/sx of and management of hypoglycemia  Asthma/COPD currently uncontrolled and complicated by recurrent thrush and cigarette smoking with >30 pack year history. Patient has previously tried using nicotine patches to quit/reduce his cigarette smoking but has no desire to quit at this time. He currently smokes about 1 pack per day.  -Patient was counseled on the importance of quitting and was provided support and encouragement if/when he decides to quit Patient reports using his albuterol inhaler every other day and Advair twice daily.  -May consider switching Advair 250/50 to Trelegy 100 for once daily control of symptoms and reduction in exposure to inhaled corticosteroids at next PCP visit 11/13/2020.  Allergic rhinitis - chronic use of diphenhydramine PRN allergy symptoms.  - Switch to cetirizine 10mg  daily during allergy season.  Patient agreeable, states he has tried in the past and reported efficacy.     Written patient instructions provided.  Total time in face to face counseling 30 minutes.   Follow up  Pharmacist/PCP Clinic Visit in 2-4 wks     Send invite to dexcom portal     Toma Aran, PharmD Candidate, and Wilson Singer, PharmD - PGY-1 Resident.

## 2020-10-29 NOTE — Assessment & Plan Note (Signed)
Diabetes longstanding currently uncontrolled. Patient is able to verbalize appropriate hypoglycemia management plan. Medication adherence appears adequate. Control is suboptimal due to lacking coverage with sliding scale insulin. -Decreased dose of basal insulin 18 units QAM (insulin glargine). -Increased dose of  rapid insulin (Humalog) to using 6-10 units per meal.  -Extensively discussed pathophysiology of diabetes, recommended lifestyle interventions, dietary effects on blood sugar control -Counseled on s/sx of and management of hypoglycemia

## 2020-10-29 NOTE — Patient Instructions (Addendum)
Nice to meet you today.   Please stop Benadryl (diphenhydramine) and use a Trial of Zyrtec (cetirizine) 10mg  once daily.   Please adjust long-acting insulin glargine (basaglar) dose to 18 units every morning and adjust your mealtime insulin (humalog) to 6-8 units per meal on average and follow new sliding scale.   One month follow-up with pharmacy schedule back.

## 2020-10-30 ENCOUNTER — Telehealth: Payer: Self-pay

## 2020-10-30 ENCOUNTER — Encounter: Payer: Self-pay | Admitting: Family Medicine

## 2020-10-30 LAB — BASIC METABOLIC PANEL
BUN/Creatinine Ratio: 14 (ref 9–20)
BUN: 13 mg/dL (ref 6–24)
CO2: 21 mmol/L (ref 20–29)
Calcium: 9.6 mg/dL (ref 8.7–10.2)
Chloride: 100 mmol/L (ref 96–106)
Creatinine, Ser: 0.96 mg/dL (ref 0.76–1.27)
GFR calc Af Amer: 105 mL/min/{1.73_m2} (ref >59)
GFR calc non Af Amer: 91 mL/min/{1.73_m2} (ref >59)
Glucose: 117 mg/dL — ABNORMAL HIGH (ref 65–99)
Potassium: 6.1 mmol/L — ABNORMAL HIGH (ref 3.5–5.2)
Sodium: 138 mmol/L (ref 134–144)

## 2020-10-30 NOTE — Telephone Encounter (Signed)
Below message received from [employer]  received    Talbot Grumbling, RN

## 2020-10-30 NOTE — Telephone Encounter (Signed)
Community message sent to [employer] for DME cane. Will await response.   Talbot Grumbling, RN

## 2020-10-31 ENCOUNTER — Ambulatory Visit: Payer: 59 | Admitting: Pharmacist

## 2020-11-05 ENCOUNTER — Other Ambulatory Visit: Payer: Self-pay | Admitting: Family Medicine

## 2020-11-05 DIAGNOSIS — E1169 Type 2 diabetes mellitus with other specified complication: Secondary | ICD-10-CM

## 2020-11-05 DIAGNOSIS — Z794 Long term (current) use of insulin: Secondary | ICD-10-CM

## 2020-11-05 DIAGNOSIS — E1165 Type 2 diabetes mellitus with hyperglycemia: Secondary | ICD-10-CM

## 2020-11-05 DIAGNOSIS — F418 Other specified anxiety disorders: Secondary | ICD-10-CM

## 2020-11-05 MED ORDER — INSULIN LISPRO (1 UNIT DIAL) 100 UNIT/ML (KWIKPEN): 6.0000 [IU] | PEN_INJECTOR | Freq: Three times a day (TID) | SUBCUTANEOUS | 11 refills | Status: DC

## 2020-11-05 MED ORDER — INSULIN GLARGINE-YFGN 100 UNIT/ML ~~LOC~~ SOPN: 18.0000 [IU] | PEN_INJECTOR | Freq: Every morning | SUBCUTANEOUS | 1 refills | Status: DC

## 2020-11-05 MED FILL — HUMALOG 100 UNITS/ML KWIKPE: 100 | 50 days supply | Qty: 15 | Fill #0

## 2020-11-05 MED FILL — JARDIANCE 25 MG TABLET: 25 | 90 days supply | Qty: 90 | Fill #0

## 2020-11-05 MED FILL — DULoxetine HCL 60 MG CPEP: 60 | 90 days supply | Qty: 90 | Fill #0

## 2020-11-05 MED FILL — ARIPiprazole 5 MG TABS: 5 | 90 days supply | Qty: 90 | Fill #0

## 2020-11-05 MED FILL — ATORVASTATIN 40 MG TABLET: 40 | 90 days supply | Qty: 90 | Fill #3

## 2020-11-05 MED FILL — SEMGLEE (YFGN) 100 UNIT/ML: 100 | 83 days supply | Qty: 15 | Fill #0

## 2020-11-05 MED FILL — MONTELUKAST SOD 10 MG TAB: 10 | 90 days supply | Qty: 90 | Fill #0

## 2020-11-05 MED FILL — JANUVIA 100 MG TABLET: 100 | 30 days supply | Qty: 30 | Fill #0

## 2020-11-07 ENCOUNTER — Telehealth: Payer: Self-pay

## 2020-11-07 ENCOUNTER — Other Ambulatory Visit: Payer: Self-pay | Admitting: Family Medicine

## 2020-11-07 DIAGNOSIS — M549 Dorsalgia, unspecified: Secondary | ICD-10-CM

## 2020-11-07 DIAGNOSIS — E875 Hyperkalemia: Secondary | ICD-10-CM

## 2020-11-07 DIAGNOSIS — R1013 Epigastric pain: Secondary | ICD-10-CM

## 2020-11-07 DIAGNOSIS — K861 Other chronic pancreatitis: Secondary | ICD-10-CM

## 2020-11-07 MED ORDER — SUCRALFATE 1 GM/10ML PO SUSP: 1.0000 g | Freq: Four times a day (QID) | ORAL | 0 refills | Status: DC

## 2020-11-07 MED ORDER — PANTOPRAZOLE SODIUM 40 MG PO TBEC: 40.0000 mg | DELAYED_RELEASE_TABLET | Freq: Two times a day (BID) | ORAL | 0 refills | Status: DC

## 2020-11-07 MED ORDER — TRAMADOL HCL 50 MG PO TABS: 50.0000 mg | ORAL_TABLET | Freq: Four times a day (QID) | ORAL | 0 refills | Status: DC | PRN

## 2020-11-07 MED FILL — SUCRALFATE 1 GM/10ML SUSP: 1 | 11 days supply | Qty: 420 | Fill #0

## 2020-11-07 MED FILL — traMADol HCL 50 MG TABS: 50 | 4 days supply | Qty: 30 | Fill #0

## 2020-11-07 NOTE — Progress Notes (Signed)
Spoke to patient regarding continued pain. HE notes the pain now localized more on the right, radiating to his back. Feels deep inside. Denies nausea/vomiting. Does note improvement in pain when he eats but then it returns. Denies melena, hematuria at this time.  Work up thus far is most consistent with chronic pancreatitis. Some concern for duodenal ulcer given improvement in pain with food intake. Does have GI follow up on 3/7.  At this time, recommended to avoid NSAIDs. Start Protonix BID, sulcralfate QID, and Tramadol 1-2 tablets as needed.  He did have some hyperkalemia on last BMP. Hyponatremia resolved. Denies taking any over the counter meds or potassium salts. Possible hemolyzed.  Plan to repeat UA with microscopy, CMP, and CBC with diff at follow up visit. Recommended sooner visit for labs however patient noted he would be unable to make it sooner than 3/2. HE will call if he is able to come in sooner for labs.

## 2020-11-07 NOTE — Telephone Encounter (Signed)
Orders placed.

## 2020-11-07 NOTE — Telephone Encounter (Signed)
Patient calls nurse line stating that he can come into the office for lab visit tomorrow. Scheduled patient tomorrow morning at 10:15. Please place future orders for lab work.   Talbot Grumbling, RN

## 2020-11-08 ENCOUNTER — Other Ambulatory Visit (INDEPENDENT_AMBULATORY_CARE_PROVIDER_SITE_OTHER): Payer: 59

## 2020-11-08 ENCOUNTER — Other Ambulatory Visit: Payer: Self-pay

## 2020-11-08 ENCOUNTER — Ambulatory Visit (INDEPENDENT_AMBULATORY_CARE_PROVIDER_SITE_OTHER): Payer: 59 | Admitting: Physician Assistant

## 2020-11-08 ENCOUNTER — Encounter: Payer: Self-pay | Admitting: Physician Assistant

## 2020-11-08 VITALS — BP 122/81 | HR 81 | Temp 98.6°F | Resp 20 | Ht 75.0 in | Wt 254.2 lb

## 2020-11-08 DIAGNOSIS — M549 Dorsalgia, unspecified: Secondary | ICD-10-CM | POA: Diagnosis not present

## 2020-11-08 DIAGNOSIS — E875 Hyperkalemia: Secondary | ICD-10-CM | POA: Diagnosis not present

## 2020-11-08 DIAGNOSIS — K861 Other chronic pancreatitis: Secondary | ICD-10-CM

## 2020-11-08 DIAGNOSIS — R1013 Epigastric pain: Secondary | ICD-10-CM | POA: Diagnosis not present

## 2020-11-08 DIAGNOSIS — S98112A Complete traumatic amputation of left great toe, initial encounter: Secondary | ICD-10-CM

## 2020-11-08 LAB — POCT UA - MICROSCOPIC ONLY

## 2020-11-08 LAB — POCT URINALYSIS DIP (MANUAL ENTRY)
Bilirubin, UA: NEGATIVE
Glucose, UA: >=1000 mg/dL — AB
Ketones, POC UA: NEGATIVE mg/dL
Leukocytes, UA: NEGATIVE
Nitrite, UA: NEGATIVE
Protein Ur, POC: NEGATIVE mg/dL
Spec Grav, UA: 1.02 (ref 1.010–1.025)
Urobilinogen, UA: 0.2 E.U./dL
pH, UA: 6 (ref 5.0–8.0)

## 2020-11-08 NOTE — Progress Notes (Signed)
POST OPERATIVE OFFICE NOTE    CC:  F/u for surgery  HPI:  This is a 50 y.o. 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 Donzetta Matters secondary to wound with osteomyolitis.   He had under gone a right TMA by Dr. Carlis Abbott 01/10/20.  His pre-op ABI's showed triphasic flow with non compressible arteries.   Pt returns today for follow up for wound check.    He denise fever and chills. He denise drainage and the edema has disappeared.   Allergies  Allergen Reactions  . Eggs Or Egg-Derived Products Rash  . Morphine And Related Other (See Comments)    Cant take because of pancreatitis  . Cocoa Rash    Current Outpatient Medications  Medication Sig Dispense Refill  . acetaminophen (TYLENOL 8 HOUR) 650 MG CR tablet Take 1 tablet (650 mg total) by mouth every 8 (eight) hours as needed for pain.    [name] albuterol (VENTOLIN HFA) 108 (90 Base) MCG/ACT inhaler Inhale 2-4 puffs into the lungs every 4 (four) hours as needed for wheezing (or cough). (Patient taking differently: Inhale 2 puffs into the lungs every 4 (four) hours as needed for wheezing.) 6.7 g 11  . ARIPiprazole (ABILIFY) 5 MG tablet TAKE 1 TABLET (5 MG TOTAL) BY MOUTH DAILY. 90 tablet 2  . atorvastatin (LIPITOR) 40 MG tablet Take 1 tablet (40 mg total) by mouth daily. 90 tablet 3  . bismuth subsalicylate (PEPTO BISMOL) 262 MG/15ML suspension Take 30 mLs by mouth every 6 (six) hours as needed for indigestion or diarrhea or loose stools.    . cetirizine (ZYRTEC) 10 MG tablet Take 1 tablet (10 mg total) by mouth daily. 30 tablet 11  . Continuous Blood Gluc Receiver (DEXCOM G6 RECEIVER) DEVI 1 Device by Does not apply route 4 (four) times daily. 1 each 11  . Continuous Blood Gluc Transmit (DEXCOM G6 TRANSMITTER) MISC 4 (four) times daily. as directed    . DULoxetine (CYMBALTA) 60 MG capsule TAKE 1 CAPSULE (60 MG TOTAL) BY MOUTH DAILY. 90 capsule 2  . EASY TOUCH PEN NEEDLES 31G X 5 MM MISC Inject 1 application as directed 3 (three)  times daily. 100 each 11  . fluticasone-salmeterol (ADVAIR HFA) 115-21 MCG/ACT inhaler Inhale 2 puffs into the lungs 2 (two) times daily.    [name] FREESTYLE LITE test strip USE AS INSTRUCTED 100 strip 12  . gabapentin (NEURONTIN) 300 MG capsule TAKE 1 CAPSULE (300 MG TOTAL) BY MOUTH 3 (THREE) TIMES DAILY. 90 capsule 2  . glucose blood test strip USE AS INSTRUCTED    . glucose blood test strip USE AS INSTRUCTED    . Insulin Glargine-yfgn 100 UNIT/ML SOPN Inject 18 Units into the skin in the morning. 15 mL 1  . insulin lispro (HUMALOG KWIKPEN) 100 UNIT/ML KwikPen Inject 6-10 Units into the skin 3 (three) times daily. 15 mL 11  . JANUVIA 100 MG tablet TAKE 1 TABLET (100 MG TOTAL) BY MOUTH DAILY. 90 tablet 10  . JARDIANCE 25 MG TABS tablet TAKE 1 TABLET BY MOUTH DAILY. 90 tablet 3  . Lancets (FREESTYLE) lancets Use as instructed 100 each 12  . metFORMIN (GLUCOPHAGE) 1000 MG tablet TAKE 1 TABLET (1,000 MG TOTAL) BY MOUTH 2 (TWO) TIMES DAILY WITH A MEAL. 180 tablet 3  . mirtazapine (REMERON) 30 MG tablet Take 1 tablet (30 mg total) by mouth at bedtime. 90 tablet 3  . montelukast (SINGULAIR) 10 MG tablet TAKE 1 TABLET (10 MG TOTAL)  BY MOUTH AT BEDTIME. 90 tablet 11  . PANCRELIPASE, LIP-PROT-AMYL, PO Take 36,000 Units by mouth with breakfast, with lunch, and with evening meal.    . pantoprazole (PROTONIX) 40 MG tablet Take 1 tablet (40 mg total) by mouth 2 (two) times daily. 30 tablet 0  . propranolol ER (INDERAL LA) 80 MG 24 hr capsule Take 1 capsule (80 mg total) by mouth daily. 90 capsule 1  . sucralfate (CARAFATE) 1 GM/10ML suspension Take 10 mLs (1 g total) by mouth 4 (four) times daily. 420 mL 0  . traMADol (ULTRAM) 50 MG tablet Take 1-2 tablets (50-100 mg total) by mouth every 6 (six) hours as needed for up to 12 days. 30 tablet 0   No current facility-administered medications for this visit.     ROS:  See HPI  Physical Exam:    Incision:  Well healed left GT amputation site Extremities:   Left foot with palpable pedal pulses, sutures intact at GT amp site.  Good cap refill.  Good skin lines.   Assessment/Plan:  This is a 50 y.o. male who is s/p:Left GT amputation Sutures where taken out patient tolerated this well.  He will f/u as needed in the future.  He has pedal pulses B.  He will watch his glucose and check his feet for injuries on a daily basis.    Roxy Horseman PA-C [facility] [phone]  Clinic MD:  Donzetta Matters

## 2020-11-09 LAB — CBC WITH DIFFERENTIAL/PLATELET
Basophils Absolute: 0 10*3/uL (ref 0.0–0.2)
Basos: 0 %
EOS (ABSOLUTE): 0.1 10*3/uL (ref 0.0–0.4)
Eos: 1 %
Hematocrit: 42.3 % (ref 37.5–51.0)
Hemoglobin: 13.9 g/dL (ref 13.0–17.7)
Immature Grans (Abs): 0.1 10*3/uL (ref 0.0–0.1)
Immature Granulocytes: 1 %
Lymphocytes Absolute: 1.6 10*3/uL (ref 0.7–3.1)
Lymphs: 16 %
MCH: 28.9 pg (ref 26.6–33.0)
MCHC: 32.9 g/dL (ref 31.5–35.7)
MCV: 88 fL (ref 79–97)
Monocytes Absolute: 0.9 10*3/uL (ref 0.1–0.9)
Monocytes: 9 %
Neutrophils Absolute: 7.6 10*3/uL — ABNORMAL HIGH (ref 1.4–7.0)
Neutrophils: 73 %
Platelets: 417 10*3/uL (ref 150–450)
RBC: 4.81 x10E6/uL (ref 4.14–5.80)
RDW: 12.7 % (ref 11.6–15.4)
WBC: 10.3 10*3/uL (ref 3.4–10.8)

## 2020-11-09 LAB — COMPREHENSIVE METABOLIC PANEL
ALT: 42 IU/L (ref 0–44)
AST: 24 IU/L (ref 0–40)
Albumin/Globulin Ratio: 1.4 (ref 1.2–2.2)
Albumin: 4.5 g/dL (ref 3.8–4.9)
Alkaline Phosphatase: 143 IU/L — ABNORMAL HIGH (ref 44–121)
BUN/Creatinine Ratio: 18 (ref 9–20)
BUN: 16 mg/dL (ref 6–24)
Bilirubin Total: 0.4 mg/dL (ref 0.0–1.2)
CO2: 18 mmol/L — ABNORMAL LOW (ref 20–29)
Calcium: 9.9 mg/dL (ref 8.7–10.2)
Chloride: 103 mmol/L (ref 96–106)
Creatinine, Ser: 0.91 mg/dL (ref 0.76–1.27)
GFR calc Af Amer: 112 mL/min/{1.73_m2} (ref >59)
GFR calc non Af Amer: 97 mL/min/{1.73_m2} (ref >59)
Globulin, Total: 3.3 g/dL (ref 1.5–4.5)
Glucose: 168 mg/dL — ABNORMAL HIGH (ref 65–99)
Potassium: 6.1 mmol/L — ABNORMAL HIGH (ref 3.5–5.2)
Sodium: 142 mmol/L (ref 134–144)
Total Protein: 7.8 g/dL (ref 6.0–8.5)

## 2020-11-12 NOTE — Progress Notes (Signed)
Subjective:   Patient ID: Christopher Fernandez    DOB: 1971-05-29, 50 y.o. male   MRN: [id number]  Christopher Fernandez is a 50 y.o. 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 Today he notes that pain continues and describes the pain as aching and no improvement. He has been treating with pancreatic enzymes, Protonix BID, sulcralfate QID and Tramadol. He was recommended to avoid NSAIDs. Today he notes pain has increased. He notes poor appetite and energy. He is able to tolerate foods but food intake seems to make it worse. He endorses improvement in stools since restarting his pancreatic enzymes. He notes that he may go a few day without a bowel meal.   Notes the pain is localized to right mid back. Notes that pain may radiate to a few inches lower and across back.   Denies any fevers/chills, nausea, vomiting, bloody stools/melena, hematuria.  He has GI follow up on 11/18/20.   Last labs obtained notable for microscopic hematuria with 3-8 RBC on microscopy, persistent hyperkalemia of unclear etiology, and elevated alk phos. Normal AST/ALT. Normal CBC.  Notes he eats about 1-2 bananas per day.  Tramadol suppresses the pain some but does not relieve the pain.  Has been using the Sulcralfate but denies any improvement.  Feels difficulty staying hydrated. He will normally drink 6-7 bottles a day, sometimes up to 12 bottles in a day.   Prior Imaging:  CT abdomen/pelvis w/ contrast: 10/23/20 IMPRESSION: 1. Sequela of chronic calcific pancreatitis. No  signs of acute pancreatitis. Pancreatic duct dilation likely due to extensive pancreatic parenchymal atrophy. 2. Moderate fecal retention. 3. Small fat containing umbilical hernia.  CXR: 10/29/20: IMPRESSION: Lungs clear.  Cardiac silhouette normal.  Review of Systems:  Per HPI.   Objective:   BP 110/70   Pulse 90   Ht '6\' 3"'  (1.905 m)   Wt 249 lb 6.4 oz (113.1 kg)   SpO2 97%   BMI 31.17 kg/m  Vitals and nursing note reviewed.  General: pleasant older male, sitting comfortably in exam chair, well nourished, well developed, in no acute distress with non-toxic appearance CV: regular rate and rhythm without murmurs, rubs, or gallops Lungs: clear to auscultation bilaterally with normal work of breathing on room air, speaking in full sentences Abdomen: soft, tender in RUQ, epigastric, and LUQ, non-distended, normoactive bowel sounds,  Tenderness along mid back but no CVA tenderness to hard palpation  Skin: warm, dry Extremities: warm and well perfused MSK: gait normal Neuro: Alert and oriented, speech normal  Assessment & Plan:   Chronic pancreatitis ([place]) Acute on chronic. I still strongly suspect symptoms are consistent with acute flare of chronic pancreatitis. Work up thus far normal. UA initial with 3-8 microscopic hematuria; repeat today was negative thus unlikely renal calculus causing pain. Patient has follow up with GI on 3/7 where I feel most benefit would be achieved. Discussed difficulty with pain management and desire to avoid narcotics. - continue pancreatic enzymes - will trial Diclofenac 41m TID + extra strength tylenol - Tramadol 50-1028mPRN for break through pain (short term rx provided until able to  see GI) - increase gabapentin to 673m TID. In one week, can increase to 9033mTID if tolerating and pain still uncontrolled - avoid alcohol - continue protonix - can consider a combination of antioxidants (b-carotene, vitamin C,vitamin E, selenium, and methionine)  which has been shown to provide significant pain relief in prior studies - Follow up with GI on 11/18/20 as scheduled - return precautions discussed  Hyperkalemia: Acute, resolved. Last two CMP's with elevated K of 6.1. Unclear cause - reviewed mediations, OTC meds, and home PO intake without identifying a culprit. Repeat today WNL.  - continue to monitor  Microscopic Hematuria: Acute, resolved. Repeat UA without signs of microscopic hematuria. No further work up recommended at this time.  Orders Placed This Encounter  Procedures  . Comprehensive metabolic panel  . POCT urinalysis dipstick  . POCT UA - Microscopic Only   Meds ordered this encounter  Medications  . pantoprazole (PROTONIX) 40 MG tablet    Sig: Take 1 tablet (40 mg total) by mouth 2 (two) times daily.    Dispense:  60 tablet    Refill:  0  . diclofenac (VOLTAREN) 50 MG EC tablet    Sig: Take 1 tablet (50 mg total) by mouth 3 (three) times daily.    Dispense:  90 tablet    Refill:  0  . gabapentin (NEURONTIN) 300 MG capsule    Sig: Take 2 capsules (600 mg total) by mouth 3 (three) times daily.    Dispense:  180 capsule    Refill:  2 [place] PGY-3, [facility] 11/15/2020 1:34 PM

## 2020-11-13 ENCOUNTER — Other Ambulatory Visit: Payer: Self-pay | Admitting: Family Medicine

## 2020-11-13 ENCOUNTER — Other Ambulatory Visit: Payer: Self-pay

## 2020-11-13 ENCOUNTER — Ambulatory Visit: Payer: 59 | Admitting: Family Medicine

## 2020-11-13 VITALS — BP 110/70 | HR 90 | Ht 75.0 in | Wt 249.4 lb

## 2020-11-13 DIAGNOSIS — R1013 Epigastric pain: Secondary | ICD-10-CM

## 2020-11-13 DIAGNOSIS — K861 Other chronic pancreatitis: Secondary | ICD-10-CM

## 2020-11-13 DIAGNOSIS — E1165 Type 2 diabetes mellitus with hyperglycemia: Secondary | ICD-10-CM | POA: Diagnosis not present

## 2020-11-13 DIAGNOSIS — R3121 Asymptomatic microscopic hematuria: Secondary | ICD-10-CM

## 2020-11-13 DIAGNOSIS — E875 Hyperkalemia: Secondary | ICD-10-CM

## 2020-11-13 DIAGNOSIS — Z794 Long term (current) use of insulin: Secondary | ICD-10-CM

## 2020-11-13 LAB — POCT URINALYSIS DIP (MANUAL ENTRY)
Bilirubin, UA: NEGATIVE
Blood, UA: NEGATIVE
Glucose, UA: >=1000 mg/dL — AB
Leukocytes, UA: NEGATIVE
Nitrite, UA: NEGATIVE
Protein Ur, POC: NEGATIVE mg/dL
Spec Grav, UA: 1.02 (ref 1.010–1.025)
Urobilinogen, UA: 0.2 E.U./dL
pH, UA: 5.5 (ref 5.0–8.0)

## 2020-11-13 LAB — POCT UA - MICROSCOPIC ONLY

## 2020-11-13 MED ORDER — GABAPENTIN 300 MG PO CAPS: 600.0000 mg | ORAL_CAPSULE | Freq: Three times a day (TID) | ORAL | 2 refills | Status: DC

## 2020-11-13 MED ORDER — PANTOPRAZOLE SODIUM 40 MG PO TBEC
40.0000 mg | DELAYED_RELEASE_TABLET | Freq: Two times a day (BID) | ORAL | 0 refills | Status: DC
Start: 2020-11-13 — End: 2020-11-13

## 2020-11-13 MED ORDER — DICLOFENAC SODIUM 50 MG PO TBEC
50.0000 mg | DELAYED_RELEASE_TABLET | Freq: Three times a day (TID) | ORAL | 0 refills | Status: DC
Start: 2020-11-13 — End: 2020-11-13

## 2020-11-13 MED FILL — DICLOFENAC SOD EC 50 MG TAB: 50 | 30 days supply | Qty: 90 | Fill #0

## 2020-11-13 MED FILL — PANTOPRAZOLE SOD DR 40 MG T: 40 | 30 days supply | Qty: 60 | Fill #0

## 2020-11-13 MED FILL — GABAPENTIN 300 MG CAPSULE: 300 | 30 days supply | Qty: 180 | Fill #0

## 2020-11-13 NOTE — Patient Instructions (Signed)
It was a pleasure to see you today!  Thank you for choosing [facility] for your primary care.   Our plans for today were:  Chronic pancreatitis: Start Diclofenac 50mg  three times a day, alternating with tylenol 650-1000mg  3-4 times a day (Max 4000mg /day). Take Tramadol 1-2 tablets for break through. Heating pad.   Increase gabapentin to 600mg  three times a day. In one week, you can even increase to 900 ( 3 tablets) three times day).   Follow up with GI on 11/18/20.     We are checking some labs today, I will call you if they are abnormal will send you a MyChart message or a letter if they are normal.  If you do not hear about your labs in the next 2 weeks please let us know.  BRING ALL OF YOUR MEDICATIONS WITH YOU TO EVERY VISIT   Best Wishes,   Mina Marble, DO

## 2020-11-13 NOTE — Assessment & Plan Note (Addendum)
Acute on chronic. I still strongly suspect symptoms are consistent with acute flare of chronic pancreatitis. Work up thus far normal. UA initial with 3-8 microscopic hematuria; repeat today was negative thus unlikely renal calculus causing pain. Patient has follow up with GI on 3/7 where I feel most benefit would be achieved. Discussed difficulty with pain management and desire to avoid narcotics. - continue pancreatic enzymes - will trial Diclofenac 50mg  TID + extra strength tylenol - Tramadol 50-100mg  PRN for break through pain (short term rx provided until able to see GI) - increase gabapentin to 600mg  TID. In one week, can increase to 900mg  TID if tolerating and pain still uncontrolled - avoid alcohol - continue protonix - can consider a combination of antioxidants (b-carotene, vitamin C,vitamin E, selenium, and methionine) which has been shown to provide significant pain relief in prior studies - Follow up with GI on 11/18/20 as scheduled - return precautions discussed

## 2020-11-14 ENCOUNTER — Encounter: Payer: Self-pay | Admitting: Family Medicine

## 2020-11-14 ENCOUNTER — Other Ambulatory Visit: Payer: Self-pay

## 2020-11-14 ENCOUNTER — Other Ambulatory Visit: Payer: Self-pay | Admitting: Family Medicine

## 2020-11-14 DIAGNOSIS — M549 Dorsalgia, unspecified: Secondary | ICD-10-CM

## 2020-11-14 DIAGNOSIS — R1013 Epigastric pain: Secondary | ICD-10-CM

## 2020-11-14 LAB — COMPREHENSIVE METABOLIC PANEL
ALT: 29 IU/L (ref 0–44)
AST: 13 IU/L (ref 0–40)
Albumin/Globulin Ratio: 1.4 (ref 1.2–2.2)
Albumin: 4.2 g/dL (ref 3.8–4.9)
Alkaline Phosphatase: 135 IU/L — ABNORMAL HIGH (ref 44–121)
BUN/Creatinine Ratio: 16 (ref 9–20)
BUN: 14 mg/dL (ref 6–24)
Bilirubin Total: 0.5 mg/dL (ref 0.0–1.2)
CO2: 19 mmol/L — ABNORMAL LOW (ref 20–29)
Calcium: 9.4 mg/dL (ref 8.7–10.2)
Chloride: 98 mmol/L (ref 96–106)
Creatinine, Ser: 0.9 mg/dL (ref 0.76–1.27)
Globulin, Total: 3 g/dL (ref 1.5–4.5)
Glucose: 149 mg/dL — ABNORMAL HIGH (ref 65–99)
Potassium: 4.6 mmol/L (ref 3.5–5.2)
Sodium: 135 mmol/L (ref 134–144)
Total Protein: 7.2 g/dL (ref 6.0–8.5)
eGFR: 103 mL/min/{1.73_m2} (ref >59)

## 2020-11-14 MED ORDER — TRAMADOL HCL 50 MG PO TABS: 50.0000 mg | ORAL_TABLET | Freq: Four times a day (QID) | ORAL | 0 refills | Status: DC | PRN

## 2020-11-14 MED FILL — traMADol HCL 50 MG TABS: 50 | 5 days supply | Qty: 30 | Fill #0

## 2020-11-18 ENCOUNTER — Ambulatory Visit: Payer: 59 | Admitting: Physician Assistant

## 2020-11-18 ENCOUNTER — Other Ambulatory Visit: Payer: Self-pay | Admitting: Physician Assistant

## 2020-11-18 ENCOUNTER — Other Ambulatory Visit: Payer: Self-pay

## 2020-11-18 ENCOUNTER — Encounter: Payer: Self-pay | Admitting: Physician Assistant

## 2020-11-18 ENCOUNTER — Encounter: Payer: Self-pay | Admitting: Gastroenterology

## 2020-11-18 VITALS — BP 130/70 | HR 70 | Ht 75.0 in | Wt 260.0 lb

## 2020-11-18 DIAGNOSIS — R1013 Epigastric pain: Secondary | ICD-10-CM | POA: Diagnosis not present

## 2020-11-18 DIAGNOSIS — M546 Pain in thoracic spine: Secondary | ICD-10-CM | POA: Diagnosis not present

## 2020-11-18 DIAGNOSIS — K861 Other chronic pancreatitis: Secondary | ICD-10-CM | POA: Diagnosis not present

## 2020-11-18 MED ORDER — CYCLOBENZAPRINE HCL 5 MG PO TABS: 5.0000 mg | ORAL_TABLET | Freq: Two times a day (BID) | ORAL | 0 refills | Status: DC | PRN

## 2020-11-18 MED ORDER — OXYCODONE-ACETAMINOPHEN 10-325 MG PO TABS: 1.0000 | ORAL_TABLET | Freq: Four times a day (QID) | ORAL | 0 refills | Status: DC | PRN

## 2020-11-18 MED FILL — OXYCODONE-APAP 10-325: 10-325 | 4 days supply | Qty: 15 | Fill #0

## 2020-11-18 MED FILL — CYCLOBENZAPRINE HCL 5 MG TA: 5 | 10 days supply | Qty: 20 | Fill #0

## 2020-11-18 NOTE — Addendum Note (Signed)
Addended by: Kandy Garrison on: 11/18/2020 09:58 AM   Modules accepted: Orders

## 2020-11-18 NOTE — Progress Notes (Signed)
Chief Complaint: Abdominal pain  HPI:    Christopher Fernandez is a 50 year old male, known to Dr. Loletha Carrow, with a past medical history of COPD, pancreatitis, alcoholism, reflux and others listed below, who was referred to me by Danna Hefty, DO for a complaint of abdominal pain and nausea with vomiting.      05/26/2018 patient seen by Dr. Loletha Carrow for pancreatitis and diarrhea.  At that time continued to drink regularly.  It was discussed he had chronic diarrhea and steatorrhea from exocrine pancreatic insufficiency and insufficient dosing of pancreatic enzymes.  He was started on Creon 36,000 units 2 with a meal and 1 with a snack.    10/23/2020 CT thousand and pelvis with contrast showed sequela of chronic calcific pancreatitis, no signs of acute pancreatitis, pancreatic duct dilation likely due to extensive pancreatic parenchymal atrophy, moderate fecal retention small fat-containing umbilical hernia.    11/08/2020 normal CBC.    11/13/2020 patient saw PCP in regards to acute on chronic pancreatitis follow-up.  At that time we have been treating pain with pancreatic enzymes, Protonix twice daily, sucralfate 4 times daily and tramadol.  At that time patient given a trial of diclofenac 50 mg 3 times daily and extra strength Tylenol.  Also refill tramadol 50-100 mg as needed for breakthrough pain.  Increase gabapentin to 600 mg 3 times daily.    11/13/2020 CMP with a glucose of 149, alk phos 135 and other LFTs normal.    Today, the patient presents to clinic accompanied by his wife who assists with his history.  He explains that about 6 weeks ago now he started with pain, initially this was in the upper part of his abdomen and would radiate around to his back and was associated with some nausea and episodes of vomiting, over the past few weeks this has worsened and seems to be originating in his back, patient tells me just to the middle of his back on the right side and then if it gets severe, greater than a 6/10, it will  radiate around to the front of his abdomen.  Tells me that he is currently taking Tramadol 1-2 tabs every 6 hours and this is still not enough for the pain in the evenings which keeps him awake at night.  Tells me it seems like his muscles tighten.  Tells me he has not any nausea or vomiting over the past couple of weeks ago.  His PCP did add Diclofenac which does not seem to be helping at all.  She also tried restarting Creon which the patient had not been on for couple years but he tells me he took 36,000 units 1 with a meal and 2 with a snack and this made him constipated so he stopped it again.  Also stopped Carafate because this was not helping.  Patient's wife feels like this is something different than his chronic pancreatitis.  Denies any further alcohol use.    Denies fever, chills, weight loss or blood in his stool.  Past Medical History:  Diagnosis Date  . Alcoholism ([place])   . Anemia   . Anxiety   . Arthritis   . Asthma   . COPD (chronic obstructive pulmonary disease) ([place])   . Depression   . Diabetes ([place])    type 2  . GERD (gastroesophageal reflux disease)   . History of hiatal hernia   . HTN (hypertension)   . 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 Pancreatitis   .  Pneumonia   . Wears glasses     Past Surgical History:  Procedure Laterality Date  . 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 AMPUTATION Right 10/18/2019   Procedure: RIGHT SECOND TOE AMPUTATION;  Surgeon: Newt Minion, MD;  Location: [facility];  Service: Orthopedics;  Laterality: Right;  . AMPUTATION Right 11/08/2019   Procedure: RIGHT TRANSMETATARSAL AMPUTATION;  Surgeon: Newt Minion, MD;  Location: [facility];  Service: Orthopedics;  Laterality: Right;  . APPENDECTOMY    . APPLICATION OF WOUND VAC Right 01/10/2020   Procedure: APPLICATION OF WOUND VAC, right foot;  Surgeon: [name], MD;  Location: [facility];  Service: Vascular;  Laterality: Right;  . CHOLECYSTECTOMY  2006   with  gallbladder and spleen  . COLONOSCOPY  8-10 years ago    in [place] exam per pt  . HERNIA REPAIR  2008, 2010   hiatal hernia and 1 additional  . NASAL SEPTUM SURGERY    . PANCREATIC PSEUDOCYST DRAINAGE    . SPLENECTOMY  2006  . STUMP REVISION Right 11/24/2019   Procedure: REVISION RIGHT TRANSMETATARSAL AMPUTATION;  Surgeon: Newt Minion, MD;  Location: [facility];  Service: Orthopedics;  Laterality: Right;  . TOTAL KNEE ARTHROPLASTY Right 1982, 1984  . TRANSMETATARSAL AMPUTATION Left 09/06/2020   Procedure: AMPUTATION LEFT GREAT TOE;  Surgeon: Waynetta Sandy, MD;  Location: [facility] [place] [facility];  Service: Vascular;  Laterality: Left;  . WOUND DEBRIDEMENT Right 01/10/2020   Procedure: incisional DEBRIDEMENT of RIGHT TRANSMETATARSAL WOUND;  Surgeon: [name], MD;  Location: [facility];  Service: Vascular;  Laterality: Right;    Current Outpatient Medications  Medication Sig Dispense Refill  . acetaminophen (TYLENOL 8 HOUR) 650 MG CR tablet Take 1 tablet (650 mg total) by mouth every 8 (eight) hours as needed for pain.    [name] albuterol (VENTOLIN HFA) 108 (90 Base) MCG/ACT inhaler Inhale 2-4 puffs into the lungs every 4 (four) hours as needed for wheezing (or cough). (Patient taking differently: Inhale 2 puffs into the lungs every 4 (four) hours as needed for wheezing.) 6.7 g 11  . ARIPiprazole (ABILIFY) 5 MG tablet TAKE 1 TABLET (5 MG TOTAL) BY MOUTH DAILY. 90 tablet 2  . atorvastatin (LIPITOR) 40 MG tablet Take 1 tablet (40 mg total) by mouth daily. 90 tablet 3  . bismuth subsalicylate (PEPTO BISMOL) 262 MG/15ML suspension Take 30 mLs by mouth every 6 (six) hours as needed for indigestion or diarrhea or loose stools.    . cetirizine (ZYRTEC) 10 MG tablet Take 1 tablet (10 mg total) by mouth daily. 30 tablet 11  . Continuous Blood Gluc Receiver (DEXCOM G6 RECEIVER) DEVI 1 Device by Does not apply route 4 (four) times daily. 1 each 11  . Continuous Blood Gluc Transmit (DEXCOM G6  TRANSMITTER) MISC 4 (four) times daily. as directed    . diclofenac (VOLTAREN) 50 MG EC tablet Take 1 tablet (50 mg total) by mouth 3 (three) times daily. 90 tablet 0  . DULoxetine (CYMBALTA) 60 MG capsule TAKE 1 CAPSULE (60 MG TOTAL) BY MOUTH DAILY. 90 capsule 2  . EASY TOUCH PEN NEEDLES 31G X 5 MM MISC Inject 1 application as directed 3 (three) times daily. 100 each 11  . fluticasone-salmeterol (ADVAIR HFA) 115-21 MCG/ACT inhaler Inhale 2 puffs into the lungs 2 (two) times daily.    [name] FREESTYLE LITE test strip USE AS INSTRUCTED 100 strip 12  . gabapentin (NEURONTIN) 300 MG capsule Take 2 capsules (600  mg total) by mouth 3 (three) times daily. 180 capsule 2  . glucose blood test strip USE AS INSTRUCTED    . glucose blood test strip USE AS INSTRUCTED    . Insulin Glargine-yfgn 100 UNIT/ML SOPN Inject 18 Units into the skin in the morning. 15 mL 1  . insulin lispro (HUMALOG KWIKPEN) 100 UNIT/ML KwikPen Inject 6-10 Units into the skin 3 (three) times daily. 15 mL 11  . JANUVIA 100 MG tablet TAKE 1 TABLET (100 MG TOTAL) BY MOUTH DAILY. 90 tablet 10  . JARDIANCE 25 MG TABS tablet TAKE 1 TABLET BY MOUTH DAILY. 90 tablet 3  . Lancets (FREESTYLE) lancets Use as instructed 100 each 12  . metFORMIN (GLUCOPHAGE) 1000 MG tablet TAKE 1 TABLET (1,000 MG TOTAL) BY MOUTH 2 (TWO) TIMES DAILY WITH A MEAL. 180 tablet 3  . mirtazapine (REMERON) 30 MG tablet Take 1 tablet (30 mg total) by mouth at bedtime. 90 tablet 3  . montelukast (SINGULAIR) 10 MG tablet TAKE 1 TABLET (10 MG TOTAL) BY MOUTH AT BEDTIME. 90 tablet 11  . PANCRELIPASE, LIP-PROT-AMYL, PO Take 36,000 Units by mouth with breakfast, with lunch, and with evening meal.    . pantoprazole (PROTONIX) 40 MG tablet Take 1 tablet (40 mg total) by mouth 2 (two) times daily. 60 tablet 0  . propranolol ER (INDERAL LA) 80 MG 24 hr capsule Take 1 capsule (80 mg total) by mouth daily. 90 capsule 1  . sucralfate (CARAFATE) 1 GM/10ML suspension Take 10 mLs (1 g total)  by mouth 4 (four) times daily. 420 mL 0  . traMADol (ULTRAM) 50 MG tablet Take 1-2 tablets (50-100 mg total) by mouth every 6 (six) hours as needed for up to 12 days. 30 tablet 0   No current facility-administered medications for this visit.    Allergies as of 11/18/2020 - Review Complete 11/18/2020  Allergen Reaction Noted  . Eggs or egg-derived products Rash 10/13/2019  . Morphine and related Other (See Comments) 03/29/2018  . Cocoa Rash 11/12/2013    Family History  Problem Relation Age of Onset  . Diabetes Mother   . Hypertension Mother   . Hyperlipidemia Mother   . Kidney disease Mother   . Thyroid disease Mother   . Breast cancer Mother        mets  . Lung cancer Mother   . Diabetes Father   . Alcohol abuse Sister   . Sickle cell trait Sister   . Diabetes Brother   . Asthma Brother   . Lung cancer Maternal Grandmother   . Kidney disease Maternal Grandmother   . Liver cancer Maternal Uncle        x 4-5  . Kidney disease Maternal Uncle   . Kidney disease Paternal Uncle   . Colon cancer Neg Hx   . Colon polyps Neg Hx   . Esophageal cancer Neg Hx   . Rectal cancer Neg Hx   . Stomach cancer Neg Hx     Social History   Socioeconomic History  . Marital status: Married    Spouse name: [name]  . Number of children: 1  . Years of education: Not on file  . Highest education level: Not on file  Occupational History  . Occupation: unemployed  Tobacco Use  . Smoking status: Current Every Day Smoker    Packs/day: 1.00    Years: 38.00    Pack years: 38.00    Types: Cigarettes    Start date: 09/14/1982  . Smokeless  tobacco: Never Used  . Tobacco comment: tobacco info given to patient  Vaping Use  . Vaping Use: Never used  Substance and Sexual Activity  . Alcohol use: Not Currently    Comment: 2-3 per day, none since 2019  . Drug use: Not Currently  . Sexual activity: Not on file  Other Topics Concern  . Not on file  Social History Narrative  . Not on file    Social Determinants of Health   Financial Resource Strain: Not on file  Food Insecurity: Not on file  Transportation Needs: Not on file  Physical Activity: Not on file  Stress: Not on file  Social Connections: Not on file  Intimate Partner Violence: Not on file    Review of Systems:    Constitutional: No weight loss, fever or chills Skin: No rash Cardiovascular: No chest pain  Respiratory: No SOB Gastrointestinal: See HPI and otherwise negative Genitourinary: No dysuria  Neurological: No headache, dizziness or syncope Musculoskeletal: No new muscle or joint pain Hematologic: No bleeding  Psychiatric: No history of depression or anxiety   Physical Exam:  Vital signs: BP 130/70   Pulse 70   Ht _0  (1.905 m)   Wt 260 lb (117.9 kg)   BMI 32.50 kg/m   Constitutional:   Pleasant AA male appears to be in NAD, Well developed, Well nourished, alert and cooperative Head:  Normocephalic and atraumatic. Eyes:   PEERL, EOMI. No icterus. Conjunctiva pink. Ears:  Normal auditory acuity. Neck:  Supple Throat: Oral cavity and pharynx without inflammation, swelling or lesion.  Respiratory: Respirations even and unlabored. Lungs clear to auscultation bilaterally.   No wheezes, crackles, or rhonchi.  Cardiovascular: Normal S1, S2. No MRG. Regular rate and rhythm. No peripheral edema, cyanosis or pallor.  Gastrointestinal:  Soft, nondistended, mild epigastric ttp. No rebound or guarding. Normal bowel sounds. No appreciable masses or hepatomegaly. Rectal:  Not performed.  Msk:  Symmetrical without gross deformities. Without edema, no deformity or joint abnormality. +moderate ttp to the right of thoracic vertebrae Neurologic:  Alert and  oriented x4;  grossly normal neurologically.  Skin:   Dry and intact without significant lesions or rashes. Psychiatric: Demonstrates good judgement and reason without abnormal affect or behaviors.  RELEVANT LABS AND IMAGING: CBC    Component Value  Date/Time   WBC 10.3 11/08/2020 1009   WBC 7.6 09/07/2020 0126   RBC 4.81 11/08/2020 1009   RBC 4.23 09/07/2020 0126   HGB 13.9 11/08/2020 1009   HCT 42.3 11/08/2020 1009   PLT 417 11/08/2020 1009   MCV 88 11/08/2020 1009   MCH 28.9 11/08/2020 1009   MCH 28.8 09/07/2020 0126   MCHC 32.9 11/08/2020 1009   MCHC 32.8 09/07/2020 0126   RDW 12.7 11/08/2020 1009   LYMPHSABS 1.6 11/08/2020 1009   MONOABS 0.7 09/04/2020 1242   EOSABS 0.1 11/08/2020 1009   BASOSABS 0.0 11/08/2020 1009    CMP     Component Value Date/Time   NA 135 11/13/2020 1033   K 4.6 11/13/2020 1033   CL 98 11/13/2020 1033   CO2 19 (L) 11/13/2020 1033   GLUCOSE 149 (H) 11/13/2020 1033   GLUCOSE 113 (H) 09/06/2020 1258   BUN 14 11/13/2020 1033   CREATININE 0.90 11/13/2020 1033   CALCIUM 9.4 11/13/2020 1033   PROT 7.2 11/13/2020 1033   ALBUMIN 4.2 11/13/2020 1033   AST 13 11/13/2020 1033   ALT 29 11/13/2020 1033   ALKPHOS 135 (H) 11/13/2020 1033  BILITOT 0.5 11/13/2020 1033   GFRNONAA 97 11/08/2020 1009   GFRNONAA >60 09/06/2020 2152   GFRAA 112 11/08/2020 1009    Assessment: 1.  Epigastric pain: History of chronic calcified pancreatitis, recent CT a month ago with no acute pancreatitis, lipase has been normal; concern this may be related to his stomach or back/other etiology 2.  Back pain: With above 3.  Screening for colorectal cancer: Patient is due for colonoscopy.  Plan: 1.  Discussed with patient that ideally I would like to do an EGD and colonoscopy for him.  We do not have an appointment for both of these procedures at the same time until April.  Due to this we are going to separate the procedures as I feel EGD is more urgent given his epigastric and back pain.  Went ahead and scheduled the patient for an EGD with Dr. Loletha Carrow next Monday per their request for timing.  Patient provided with a detailed list of risk for the procedure and he agrees to proceed.  We will have to schedule patient's  colonoscopy after evaluation for his acute symptoms. 2.  Ordered MRI with and without contrast with pancreatic protocol for further evaluation of ongoing pain.  Some suspicion this may be back related rather than pancreas related. 3.  Continue Tramadol 1-2 tabs every 6 hours for pain. 4.  Prescribed Percocet 5/325 1 tab every 6 hours for breakthrough pain #15 and no refills. 5.  Continue Pantoprazole 40 twice daily.  Discussed restarting Creon as this may help some if it is related at all to his pancreas. 6.  Also refilled Flexeril per patient's request 7.  Patient to follow in clinic per recommendations from Dr. Loletha Carrow after time of procedure.  Ellouise Newer, PA-C [facility] 11/18/2020, 8:57 AM  Cc: Danna Hefty, DO

## 2020-11-18 NOTE — Addendum Note (Signed)
Addended by: Horris Latino on: 11/18/2020 10:13 AM   Modules accepted: Orders

## 2020-11-18 NOTE — Patient Instructions (Signed)
If you are age 52 or older, your body mass index should be between 23-30. Your Body mass index is 32.5 kg/m. If this is out of the aforementioned range listed, please consider follow up with your Primary Care Provider.  If you are age 19 or younger, your body mass index should be between 19-25. Your Body mass index is 32.5 kg/m. If this is out of the aformentioned range listed, please consider follow up with your Primary Care Provider.   You have been scheduled for an MRI at [facility] on Thursday 11/28/20. Your appointment time is 7 pm. Please arrive 15 minutes prior to your appointment time for registration purposes. Please make certain not to have anything to eat or drink 4 hours prior to your test. In addition, if you have any metal in your body, have a pacemaker or defibrillator, please be sure to let your ordering physician know. This test typically takes 45 minutes to 1 hour to complete. Should you need to reschedule, please call [phone] to do so.  You have been scheduled for an endoscopy. Please follow written instructions given to you at your visit today. If you use inhalers (even only as needed), please bring them with you on the day of your procedure.  Due to recent changes in healthcare laws, you may see the results of your imaging and laboratory studies on MyChart before your provider has had a chance to review them.  We understand that in some cases there may be results that are confusing or concerning to you. Not all laboratory results come back in the same time frame and the provider may be waiting for multiple results in order to interpret others.  Please give [place] 48 hours in order for your provider to thoroughly review all the results before contacting the office for clarification of your results.

## 2020-11-20 ENCOUNTER — Other Ambulatory Visit: Payer: Self-pay | Admitting: Family Medicine

## 2020-11-20 ENCOUNTER — Encounter: Payer: Self-pay | Admitting: Gastroenterology

## 2020-11-20 ENCOUNTER — Ambulatory Visit (AMBULATORY_SURGERY_CENTER): Payer: 59 | Admitting: Gastroenterology

## 2020-11-20 ENCOUNTER — Other Ambulatory Visit: Payer: Self-pay

## 2020-11-20 VITALS — BP 122/86 | HR 64 | Temp 97.5°F | Resp 16 | Ht 75.0 in | Wt 250.0 lb

## 2020-11-20 DIAGNOSIS — K209 Esophagitis, unspecified without bleeding: Secondary | ICD-10-CM

## 2020-11-20 DIAGNOSIS — R1032 Left lower quadrant pain: Secondary | ICD-10-CM | POA: Diagnosis not present

## 2020-11-20 DIAGNOSIS — K2970 Gastritis, unspecified, without bleeding: Secondary | ICD-10-CM

## 2020-11-20 DIAGNOSIS — B3781 Candidal esophagitis: Secondary | ICD-10-CM | POA: Diagnosis not present

## 2020-11-20 DIAGNOSIS — K2990 Gastroduodenitis, unspecified, without bleeding: Secondary | ICD-10-CM

## 2020-11-20 DIAGNOSIS — K3189 Other diseases of stomach and duodenum: Secondary | ICD-10-CM | POA: Diagnosis not present

## 2020-11-20 DIAGNOSIS — R1013 Epigastric pain: Secondary | ICD-10-CM

## 2020-11-20 DIAGNOSIS — J4520 Mild intermittent asthma, uncomplicated: Secondary | ICD-10-CM

## 2020-11-20 MED ORDER — SODIUM CHLORIDE 0.9 % IV SOLN: 500.0000 mL | INTRAVENOUS | Status: DC

## 2020-11-20 NOTE — Op Note (Signed)
[facility] Patient Name: Christopher Fernandez Procedure Date: 11/20/2020 2:50 PM MRN: [id number] Endoscopist: Mallie Mussel L. Loletha Carrow , MD Age: 50 Referring MD:  Date of Birth: 10/24/1970 Gender: Male Account #: [id number] Procedure:                Upper GI endoscopy Indications:              Epigastric abdominal pain, chronic pancreatitis Medicines:                Monitored Anesthesia Care Procedure:                Pre-Anesthesia Assessment:                           - Prior to the procedure, a History and Physical                            was performed, and patient medications and                            allergies were reviewed. The patient's tolerance of                            previous anesthesia was also reviewed. The risks                            and benefits of the procedure and the sedation                            options and risks were discussed with the patient.                            All questions were answered, and informed consent                            was obtained. Prior Anticoagulants: The patient has                            taken no previous anticoagulant or antiplatelet                            agents. ASA Grade Assessment: III - A patient with                            severe systemic disease. After reviewing the risks                            and benefits, the patient was deemed in                            satisfactory condition to undergo the procedure.                           After obtaining informed consent, the endoscope was  passed under direct vision. Throughout the                            procedure, the patient's blood pressure, pulse, and                            oxygen saturations were monitored continuously. The                            Endoscope was introduced through the mouth, and                            advanced to the second part of duodenum. The upper                            GI  endoscopy was accomplished without difficulty.                            The patient tolerated the procedure well. Scope In: Scope Out: Findings:                 Diffuse, yellow plaques were found in the entire                            esophagus. Biopsies were taken with a cold forceps                            for histology.                           A small amount of food (residue) was found in the                            gastric fundus and in the gastric body.                           A few erosions with no bleeding and no stigmata of                            recent bleeding were found in the prepyloric region                            of the stomach. Biopsies were taken with a cold                            forceps for histologyn (antrum and body).                           The cardia and gastric fundus were normal on                            retroflexion.  The examined duodenum was normal. Complications:            No immediate complications. Estimated Blood Loss:     Estimated blood loss was minimal. Impression:               - Esophageal plaques were found, suspicious for                            candidiasis. Biopsied.                           - A small amount of food (residue) in the stomach.                           - Gastric erosions with no bleeding and no stigmata                            of recent bleeding. Biopsied.                           - Normal examined duodenum.                           Assuming patient has not eaten solid food today, it                            is not clear if the retained food in the stomach is                            effect of opioids or diabetic gastroparesis. Recommendation:           - Patient has a contact number available for                            emergencies. The signs and symptoms of potential                            delayed complications were discussed with the                             patient. Return to normal activities tomorrow.                            Written discharge instructions were provided to the                            patient.                           - Resume previous diet.                           - Continue present medications.                           - Await pathology results.                           -  MRI abdomen was scheduled at recent office visit.                            MRCP will be added to evaluate for PD                            stricture/stone. Taim Wurm L. Loletha Carrow, MD 11/20/2020 3:23:21 PM This report has been signed electronically.

## 2020-11-20 NOTE — Patient Instructions (Signed)
YOU HAD AN ENDOSCOPIC PROCEDURE TODAY AT THE [facility]:   Refer to the procedure report that was given to you for any specific questions about what was found during the examination.  If the procedure report does not answer your questions, please call your gastroenterologist to clarify.  If you requested that your care partner not be given the details of your procedure findings, then the procedure report has been included in a sealed envelope for you to review at your convenience later.  YOU SHOULD EXPECT: Some feelings of bloating in the abdomen. Passage of more gas than usual.  Walking can help get rid of the air that was put into your GI tract during the procedure and reduce the bloating. If you had a lower endoscopy (such as a colonoscopy or flexible sigmoidoscopy) you may notice spotting of blood in your stool or on the toilet paper. If you underwent a bowel prep for your procedure, you may not have a normal bowel movement for a few days.  Please Note:  You might notice some irritation and congestion in your nose or some drainage.  This is from the oxygen used during your procedure.  There is no need for concern and it should clear up in a day or so.  SYMPTOMS TO REPORT IMMEDIATELY:    Following upper endoscopy (EGD)  Vomiting of blood or coffee ground material  New chest pain or pain under the shoulder blades  Painful or persistently difficult swallowing  New shortness of breath  Fever of 100F or higher  Black, tarry-looking stools  For urgent or emergent issues, a gastroenterologist can be reached at any hour by calling [phone]. Do not use MyChart messaging for urgent concerns.    DIET:  We do recommend a small meal at first, but then you may proceed to your regular diet.  Drink plenty of fluids but you should avoid alcoholic beverages for 24 hours.  ACTIVITY:  You should plan to take it easy for the rest of today and you should NOT DRIVE or use heavy machinery  until tomorrow (because of the sedation medicines used during the test).    FOLLOW UP: Our staff will call the number listed on your records 48-72 hours following your procedure to check on you and address any questions or concerns that you may have regarding the information given to you following your procedure. If we do not reach you, we will leave a message.  We will attempt to reach you two times.  During this call, we will ask if you have developed any symptoms of COVID 19. If you develop any symptoms (ie: fever, flu-like symptoms, shortness of breath, cough etc.) before then, please call [phone].  If you test positive for Covid 19 in the 2 weeks post procedure, please call and report this information to us.    If any biopsies were taken you will be contacted by phone or by letter within the next 1-3 weeks.  Please call us at [phone] if you have not heard about the biopsies in 3 weeks.    SIGNATURES/CONFIDENTIALITY: You and/or your care partner have signed paperwork which will be entered into your electronic medical record.  These signatures attest to the fact that that the information above on your After Visit Summary has been reviewed and is understood.  Full responsibility of the confidentiality of this discharge information lies with you and/or your care-partner. 

## 2020-11-20 NOTE — Progress Notes (Signed)
Vitals by [place] 

## 2020-11-20 NOTE — Progress Notes (Signed)
Called to room to assist during endoscopic procedure.  Patient ID and intended procedure confirmed with present staff. Received instructions for my participation in the procedure from the performing physician.  

## 2020-11-20 NOTE — Progress Notes (Signed)
PT taken to PACU. Monitors in place. VSS. Report given to RN. 

## 2020-11-20 NOTE — Progress Notes (Signed)
____________________________________________________________  Attending physician addendum:  Thank you for sending this case to me. I have reviewed the entire note and agree with the plan.  He is on the endoscopy schedule for today. Agree with additional imaging as planned, and long term pain control by PCP or pain clinic.  Wilfrid Lund, MD  ____________________________________________________________

## 2020-11-21 ENCOUNTER — Telehealth: Payer: Self-pay

## 2020-11-21 ENCOUNTER — Other Ambulatory Visit: Payer: Self-pay

## 2020-11-21 DIAGNOSIS — K861 Other chronic pancreatitis: Secondary | ICD-10-CM

## 2020-11-21 DIAGNOSIS — M546 Pain in thoracic spine: Secondary | ICD-10-CM

## 2020-11-21 DIAGNOSIS — R1013 Epigastric pain: Secondary | ICD-10-CM

## 2020-11-21 MED FILL — ALBUTEROL SULFATE HFA 108 (: 108 (90 BAS | 8 days supply | Qty: 18 | Fill #0

## 2020-11-21 MED FILL — ADVAIR 250/50 DISKUS: 250-50 | 90 days supply | Qty: 180 | Fill #0

## 2020-11-21 NOTE — Telephone Encounter (Signed)
Order for MRI abdomen discontinued.   New order for MR Abdomen MRCP W and WO contrast entered. Spoke with Vickii Chafe at radiology scheduling, the order has been changed.

## 2020-11-21 NOTE — Telephone Encounter (Signed)
-----   Message from [place], MD sent at 11/20/2020  4:42 PM EST ----- This patient was seen in clinic by JLL and has an MRI abdomen scheduled for next week. Please change order to make it MRI abdomen and MRCP.  Evaluate upper abdominal pain and chronic pancreatitis.  R/o CBD stone, PD stone, PD stricture  Thanks  - HD

## 2020-11-22 ENCOUNTER — Telehealth: Payer: Self-pay

## 2020-11-22 NOTE — Telephone Encounter (Signed)
  Follow up Call-  Call back number 11/20/2020  Post procedure Call Back phone  # [phone]  Permission to leave phone message Yes  Some recent data might be hidden     Patient questions:  Do you have a fever, pain , or abdominal swelling? No. Pain Score  0 *  Have you tolerated food without any problems? Yes.    Have you been able to return to your normal activities? Yes.    Do you have any questions about your discharge instructions: Diet   No. Medications  No. Follow up visit  No.  Do you have questions or concerns about your Care? No.  Actions: * If pain score is 4 or above: No action needed, pain <4. 1. Have you developed a fever since your procedure? no  2.   Have you had an respiratory symptoms (SOB or cough) since your procedure? no  3.   Have you tested positive for COVID 19 since your procedure no  4.   Have you had any family members/close contacts diagnosed with the COVID 19 since your procedure?  no   If yes to any of these questions please route to Joylene John, RN and Joella Prince, RN

## 2020-11-26 ENCOUNTER — Telehealth: Payer: Self-pay | Admitting: Physician Assistant

## 2020-11-26 NOTE — Telephone Encounter (Signed)
Please advise 

## 2020-11-26 NOTE — Telephone Encounter (Signed)
He will need to call his PCP in regards to further pain meds or he will need to establish with a pain care clinic.  Thanks-JLL

## 2020-11-27 ENCOUNTER — Other Ambulatory Visit: Payer: Self-pay | Admitting: Gastroenterology

## 2020-11-27 ENCOUNTER — Encounter: Payer: Self-pay | Admitting: Gastroenterology

## 2020-11-27 ENCOUNTER — Telehealth: Payer: Self-pay | Admitting: *Deleted

## 2020-11-27 DIAGNOSIS — B3781 Candidal esophagitis: Secondary | ICD-10-CM

## 2020-11-27 MED ORDER — FLUCONAZOLE 100 MG PO TABS: 100.0000 mg | ORAL_TABLET | Freq: Every day | ORAL | 0 refills | Status: DC

## 2020-11-27 NOTE — Telephone Encounter (Signed)
No maam. Thank you. JLL

## 2020-11-27 NOTE — Telephone Encounter (Signed)
We received a phone call from Audrea Muscat with [facility] indicating that fluconazole sent for patient can have contraindication with patients atorvastatin (rhabdomyolysis). She would like guidance as to what to do about this. I have spoken to Dr Loletha Carrow who has asked that a note be placed on script indicating that atorvastatin be held x 21 days (same amount of time as patient will be taking fluconazole) while taking fluconazole. Audrea Muscat verbalizes understanding of this and states she will do this. I have also contacted patient to advise that he should hold atorvastatin while taking fluconazole due to the risk of potential contraindication. He verbalizes understanding and is also instructed to restart atorvastatin once he finishes fluconazole medication.

## 2020-11-27 NOTE — Telephone Encounter (Signed)
Patient wishes to go with a pain management clinic.  Is there a preference to where you send people?

## 2020-11-28 ENCOUNTER — Other Ambulatory Visit: Payer: Self-pay

## 2020-11-28 ENCOUNTER — Ambulatory Visit (HOSPITAL_COMMUNITY): Admission: RE | Admit: 2020-11-28 | Payer: 59 | Source: Ambulatory Visit

## 2020-11-28 ENCOUNTER — Other Ambulatory Visit: Payer: Self-pay | Admitting: Physician Assistant

## 2020-11-28 ENCOUNTER — Ambulatory Visit (HOSPITAL_COMMUNITY)
Admission: RE | Admit: 2020-11-28 | Discharge: 2020-11-28 | Disposition: A | Discharge status: Home / Self Care | Payer: 59 | Source: Ambulatory Visit | Attending: Physician Assistant | Admitting: Physician Assistant

## 2020-11-28 DIAGNOSIS — K861 Other chronic pancreatitis: Secondary | ICD-10-CM | POA: Insufficient documentation

## 2020-11-28 DIAGNOSIS — M546 Pain in thoracic spine: Secondary | ICD-10-CM

## 2020-11-28 DIAGNOSIS — R1013 Epigastric pain: Secondary | ICD-10-CM

## 2020-11-28 MED ORDER — GADOBUTROL 1 MMOL/ML IV SOLN
10.0000 mL | Freq: Once | INTRAVENOUS | Status: AC | PRN
  Administered 2020-11-28: 10 mL via INTRAVENOUS

## 2020-11-28 NOTE — Telephone Encounter (Signed)
Referral has been faxed to Dr Elta Guadeloupe Philips with [facility].  The referral will be reviewed and they will contact us with the decision of approval or denial

## 2020-11-29 ENCOUNTER — Ambulatory Visit (INDEPENDENT_AMBULATORY_CARE_PROVIDER_SITE_OTHER): Payer: 59 | Admitting: Family Medicine

## 2020-11-29 ENCOUNTER — Other Ambulatory Visit: Payer: Self-pay | Admitting: Family Medicine

## 2020-11-29 ENCOUNTER — Other Ambulatory Visit: Payer: Self-pay

## 2020-11-29 ENCOUNTER — Inpatient Hospital Stay (HOSPITAL_COMMUNITY)
Admission: AD | Admit: 2020-11-29 | Discharge: 2020-12-10 | DRG: 617 | Disposition: A | Discharge status: Home Health | Payer: 59 | Source: Ambulatory Visit | Attending: Family Medicine | Admitting: Family Medicine

## 2020-11-29 ENCOUNTER — Telehealth: Payer: Self-pay | Admitting: Physician Assistant

## 2020-11-29 ENCOUNTER — Telehealth: Payer: Self-pay

## 2020-11-29 ENCOUNTER — Encounter: Payer: Self-pay | Admitting: Family Medicine

## 2020-11-29 VITALS — BP 110/62 | HR 90 | Wt 249.0 lb

## 2020-11-29 DIAGNOSIS — M4644 Discitis, unspecified, thoracic region: Secondary | ICD-10-CM | POA: Diagnosis not present

## 2020-11-29 DIAGNOSIS — G250 Essential tremor: Secondary | ICD-10-CM | POA: Diagnosis present

## 2020-11-29 DIAGNOSIS — R109 Unspecified abdominal pain: Secondary | ICD-10-CM | POA: Diagnosis not present

## 2020-11-29 DIAGNOSIS — R590 Localized enlarged lymph nodes: Secondary | ICD-10-CM | POA: Diagnosis not present

## 2020-11-29 DIAGNOSIS — Z89411 Acquired absence of right great toe: Secondary | ICD-10-CM

## 2020-11-29 DIAGNOSIS — E11628 Type 2 diabetes mellitus with other skin complications: Secondary | ICD-10-CM | POA: Diagnosis not present

## 2020-11-29 DIAGNOSIS — E785 Hyperlipidemia, unspecified: Secondary | ICD-10-CM | POA: Diagnosis present

## 2020-11-29 DIAGNOSIS — L089 Local infection of the skin and subcutaneous tissue, unspecified: Secondary | ICD-10-CM | POA: Diagnosis not present

## 2020-11-29 DIAGNOSIS — K2970 Gastritis, unspecified, without bleeding: Secondary | ICD-10-CM | POA: Diagnosis present

## 2020-11-29 DIAGNOSIS — K439 Ventral hernia without obstruction or gangrene: Secondary | ICD-10-CM | POA: Diagnosis present

## 2020-11-29 DIAGNOSIS — K2950 Unspecified chronic gastritis without bleeding: Secondary | ICD-10-CM | POA: Diagnosis not present

## 2020-11-29 DIAGNOSIS — M869 Osteomyelitis, unspecified: Secondary | ICD-10-CM | POA: Diagnosis present

## 2020-11-29 DIAGNOSIS — K219 Gastro-esophageal reflux disease without esophagitis: Secondary | ICD-10-CM | POA: Diagnosis present

## 2020-11-29 DIAGNOSIS — I313 Pericardial effusion (noninflammatory): Secondary | ICD-10-CM | POA: Diagnosis present

## 2020-11-29 DIAGNOSIS — E1165 Type 2 diabetes mellitus with hyperglycemia: Secondary | ICD-10-CM | POA: Diagnosis not present

## 2020-11-29 DIAGNOSIS — S91102A Unspecified open wound of left great toe without damage to nail, initial encounter: Secondary | ICD-10-CM | POA: Diagnosis not present

## 2020-11-29 DIAGNOSIS — K8050 Calculus of bile duct without cholangitis or cholecystitis without obstruction: Secondary | ICD-10-CM | POA: Diagnosis not present

## 2020-11-29 DIAGNOSIS — M6089 Other myositis, multiple sites: Secondary | ICD-10-CM | POA: Diagnosis not present

## 2020-11-29 DIAGNOSIS — R7401 Elevation of levels of liver transaminase levels: Secondary | ICD-10-CM | POA: Diagnosis not present

## 2020-11-29 DIAGNOSIS — R932 Abnormal findings on diagnostic imaging of liver and biliary tract: Secondary | ICD-10-CM | POA: Diagnosis not present

## 2020-11-29 DIAGNOSIS — B3781 Candidal esophagitis: Secondary | ICD-10-CM | POA: Diagnosis not present

## 2020-11-29 DIAGNOSIS — Z801 Family history of malignant neoplasm of trachea, bronchus and lung: Secondary | ICD-10-CM

## 2020-11-29 DIAGNOSIS — F418 Other specified anxiety disorders: Secondary | ICD-10-CM | POA: Diagnosis present

## 2020-11-29 DIAGNOSIS — B370 Candidal stomatitis: Secondary | ICD-10-CM | POA: Diagnosis not present

## 2020-11-29 DIAGNOSIS — L97529 Non-pressure chronic ulcer of other part of left foot with unspecified severity: Secondary | ICD-10-CM | POA: Diagnosis present

## 2020-11-29 DIAGNOSIS — J4520 Mild intermittent asthma, uncomplicated: Secondary | ICD-10-CM | POA: Diagnosis present

## 2020-11-29 DIAGNOSIS — L02612 Cutaneous abscess of left foot: Secondary | ICD-10-CM | POA: Diagnosis present

## 2020-11-29 DIAGNOSIS — E669 Obesity, unspecified: Secondary | ICD-10-CM | POA: Diagnosis present

## 2020-11-29 DIAGNOSIS — R7881 Bacteremia: Secondary | ICD-10-CM | POA: Diagnosis not present

## 2020-11-29 DIAGNOSIS — E1152 Type 2 diabetes mellitus with diabetic peripheral angiopathy with gangrene: Secondary | ICD-10-CM | POA: Diagnosis present

## 2020-11-29 DIAGNOSIS — Z800 Family history of malignant neoplasm of digestive organs: Secondary | ICD-10-CM

## 2020-11-29 DIAGNOSIS — Z794 Long term (current) use of insulin: Secondary | ICD-10-CM

## 2020-11-29 DIAGNOSIS — J449 Chronic obstructive pulmonary disease, unspecified: Secondary | ICD-10-CM | POA: Diagnosis present

## 2020-11-29 DIAGNOSIS — M4624 Osteomyelitis of vertebra, thoracic region: Secondary | ICD-10-CM | POA: Diagnosis not present

## 2020-11-29 DIAGNOSIS — Z8249 Family history of ischemic heart disease and other diseases of the circulatory system: Secondary | ICD-10-CM

## 2020-11-29 DIAGNOSIS — M25451 Effusion, right hip: Secondary | ICD-10-CM | POA: Diagnosis not present

## 2020-11-29 DIAGNOSIS — R1013 Epigastric pain: Secondary | ICD-10-CM | POA: Diagnosis not present

## 2020-11-29 DIAGNOSIS — I10 Essential (primary) hypertension: Secondary | ICD-10-CM | POA: Diagnosis not present

## 2020-11-29 DIAGNOSIS — Z841 Family history of disorders of kidney and ureter: Secondary | ICD-10-CM

## 2020-11-29 DIAGNOSIS — E1169 Type 2 diabetes mellitus with other specified complication: Principal | ICD-10-CM | POA: Diagnosis present

## 2020-11-29 DIAGNOSIS — M60003 Infective myositis, unspecified right leg: Secondary | ICD-10-CM | POA: Diagnosis not present

## 2020-11-29 DIAGNOSIS — M86672 Other chronic osteomyelitis, left ankle and foot: Secondary | ICD-10-CM | POA: Diagnosis not present

## 2020-11-29 DIAGNOSIS — F17210 Nicotine dependence, cigarettes, uncomplicated: Secondary | ICD-10-CM | POA: Diagnosis present

## 2020-11-29 DIAGNOSIS — Z832 Family history of diseases of the blood and blood-forming organs and certain disorders involving the immune mechanism: Secondary | ICD-10-CM

## 2020-11-29 DIAGNOSIS — Z8349 Family history of other endocrine, nutritional and metabolic diseases: Secondary | ICD-10-CM

## 2020-11-29 DIAGNOSIS — Z7984 Long term (current) use of oral hypoglycemic drugs: Secondary | ICD-10-CM

## 2020-11-29 DIAGNOSIS — E11621 Type 2 diabetes mellitus with foot ulcer: Secondary | ICD-10-CM | POA: Diagnosis present

## 2020-11-29 DIAGNOSIS — E875 Hyperkalemia: Secondary | ICD-10-CM | POA: Diagnosis not present

## 2020-11-29 DIAGNOSIS — K5900 Constipation, unspecified: Secondary | ICD-10-CM | POA: Diagnosis not present

## 2020-11-29 DIAGNOSIS — K860 Alcohol-induced chronic pancreatitis: Secondary | ICD-10-CM | POA: Diagnosis present

## 2020-11-29 DIAGNOSIS — E1140 Type 2 diabetes mellitus with diabetic neuropathy, unspecified: Secondary | ICD-10-CM

## 2020-11-29 DIAGNOSIS — M8618 Other acute osteomyelitis, other site: Secondary | ICD-10-CM | POA: Diagnosis present

## 2020-11-29 DIAGNOSIS — M868X7 Other osteomyelitis, ankle and foot: Secondary | ICD-10-CM | POA: Diagnosis present

## 2020-11-29 DIAGNOSIS — K831 Obstruction of bile duct: Secondary | ICD-10-CM | POA: Diagnosis not present

## 2020-11-29 DIAGNOSIS — D75839 Thrombocytosis, unspecified: Secondary | ICD-10-CM | POA: Diagnosis present

## 2020-11-29 DIAGNOSIS — K8681 Exocrine pancreatic insufficiency: Secondary | ICD-10-CM | POA: Diagnosis present

## 2020-11-29 DIAGNOSIS — M1990 Unspecified osteoarthritis, unspecified site: Secondary | ICD-10-CM | POA: Diagnosis present

## 2020-11-29 DIAGNOSIS — K8689 Other specified diseases of pancreas: Secondary | ICD-10-CM | POA: Diagnosis not present

## 2020-11-29 DIAGNOSIS — Z6832 Body mass index (BMI) 32.0-32.9, adult: Secondary | ICD-10-CM

## 2020-11-29 DIAGNOSIS — B9562 Methicillin resistant Staphylococcus aureus infection as the cause of diseases classified elsewhere: Secondary | ICD-10-CM | POA: Diagnosis not present

## 2020-11-29 DIAGNOSIS — M25551 Pain in right hip: Secondary | ICD-10-CM | POA: Diagnosis present

## 2020-11-29 DIAGNOSIS — Z20822 Contact with and (suspected) exposure to covid-19: Secondary | ICD-10-CM | POA: Diagnosis present

## 2020-11-29 DIAGNOSIS — Z825 Family history of asthma and other chronic lower respiratory diseases: Secondary | ICD-10-CM

## 2020-11-29 DIAGNOSIS — Z48815 Encounter for surgical aftercare following surgery on the digestive system: Secondary | ICD-10-CM | POA: Diagnosis not present

## 2020-11-29 DIAGNOSIS — Z89421 Acquired absence of other right toe(s): Secondary | ICD-10-CM

## 2020-11-29 DIAGNOSIS — R61 Generalized hyperhidrosis: Secondary | ICD-10-CM | POA: Diagnosis present

## 2020-11-29 DIAGNOSIS — Z96651 Presence of right artificial knee joint: Secondary | ICD-10-CM | POA: Diagnosis present

## 2020-11-29 DIAGNOSIS — S91302A Unspecified open wound, left foot, initial encounter: Secondary | ICD-10-CM | POA: Diagnosis not present

## 2020-11-29 DIAGNOSIS — D649 Anemia, unspecified: Secondary | ICD-10-CM | POA: Diagnosis present

## 2020-11-29 DIAGNOSIS — F419 Anxiety disorder, unspecified: Secondary | ICD-10-CM | POA: Diagnosis present

## 2020-11-29 DIAGNOSIS — K838 Other specified diseases of biliary tract: Secondary | ICD-10-CM | POA: Diagnosis not present

## 2020-11-29 DIAGNOSIS — Z79899 Other long term (current) drug therapy: Secondary | ICD-10-CM

## 2020-11-29 DIAGNOSIS — Z803 Family history of malignant neoplasm of breast: Secondary | ICD-10-CM

## 2020-11-29 DIAGNOSIS — M4645 Discitis, unspecified, thoracolumbar region: Secondary | ICD-10-CM | POA: Diagnosis present

## 2020-11-29 DIAGNOSIS — A4102 Sepsis due to Methicillin resistant Staphylococcus aureus: Secondary | ICD-10-CM | POA: Diagnosis not present

## 2020-11-29 DIAGNOSIS — M86151 Other acute osteomyelitis, right femur: Secondary | ICD-10-CM | POA: Diagnosis present

## 2020-11-29 DIAGNOSIS — I96 Gangrene, not elsewhere classified: Secondary | ICD-10-CM | POA: Diagnosis not present

## 2020-11-29 DIAGNOSIS — R1031 Right lower quadrant pain: Secondary | ICD-10-CM | POA: Diagnosis not present

## 2020-11-29 DIAGNOSIS — Z9081 Acquired absence of spleen: Secondary | ICD-10-CM | POA: Diagnosis not present

## 2020-11-29 DIAGNOSIS — F1020 Alcohol dependence, uncomplicated: Secondary | ICD-10-CM | POA: Diagnosis present

## 2020-11-29 DIAGNOSIS — L03032 Cellulitis of left toe: Secondary | ICD-10-CM | POA: Diagnosis present

## 2020-11-29 DIAGNOSIS — Z833 Family history of diabetes mellitus: Secondary | ICD-10-CM

## 2020-11-29 DIAGNOSIS — M8619 Other acute osteomyelitis, multiple sites: Secondary | ICD-10-CM | POA: Diagnosis not present

## 2020-11-29 DIAGNOSIS — I088 Other rheumatic multiple valve diseases: Secondary | ICD-10-CM | POA: Diagnosis not present

## 2020-11-29 DIAGNOSIS — R7982 Elevated C-reactive protein (CRP): Secondary | ICD-10-CM | POA: Diagnosis present

## 2020-11-29 DIAGNOSIS — R1011 Right upper quadrant pain: Secondary | ICD-10-CM | POA: Diagnosis not present

## 2020-11-29 DIAGNOSIS — F32A Depression, unspecified: Secondary | ICD-10-CM | POA: Diagnosis present

## 2020-11-29 DIAGNOSIS — Z8614 Personal history of Methicillin resistant Staphylococcus aureus infection: Secondary | ICD-10-CM

## 2020-11-29 DIAGNOSIS — R945 Abnormal results of liver function studies: Secondary | ICD-10-CM | POA: Diagnosis not present

## 2020-11-29 DIAGNOSIS — Z9049 Acquired absence of other specified parts of digestive tract: Secondary | ICD-10-CM

## 2020-11-29 DIAGNOSIS — Z89412 Acquired absence of left great toe: Secondary | ICD-10-CM

## 2020-11-29 DIAGNOSIS — R748 Abnormal levels of other serum enzymes: Secondary | ICD-10-CM | POA: Diagnosis not present

## 2020-11-29 DIAGNOSIS — I3139 Other pericardial effusion (noninflammatory): Secondary | ICD-10-CM | POA: Diagnosis present

## 2020-11-29 LAB — CBC
HCT: 34.5 % — ABNORMAL LOW (ref 39.0–52.0)
Hemoglobin: 11.3 g/dL — ABNORMAL LOW (ref 13.0–17.0)
MCH: 28.3 pg (ref 26.0–34.0)
MCHC: 32.8 g/dL (ref 30.0–36.0)
MCV: 86.5 fL (ref 80.0–100.0)
Platelets: 482 10*3/uL — ABNORMAL HIGH (ref 150–400)
RBC: 3.99 MIL/uL — ABNORMAL LOW (ref 4.22–5.81)
RDW: 13.5 % (ref 11.5–15.5)
WBC: 8.5 10*3/uL (ref 4.0–10.5)
nRBC: 0 % (ref 0.0–0.2)

## 2020-11-29 LAB — URINALYSIS, ROUTINE W REFLEX MICROSCOPIC
Bacteria, UA: NONE SEEN
Bilirubin Urine: NEGATIVE
Glucose, UA: >=500 mg/dL — AB
Hgb urine dipstick: NEGATIVE
Ketones, ur: NEGATIVE mg/dL
Leukocytes,Ua: NEGATIVE
Nitrite: NEGATIVE
Protein, ur: NEGATIVE mg/dL
Specific Gravity, Urine: 1.028 (ref 1.005–1.030)
pH: 5 (ref 5.0–8.0)

## 2020-11-29 LAB — COMPREHENSIVE METABOLIC PANEL
ALT: 15 U/L (ref 0–44)
AST: 18 U/L (ref 15–41)
Albumin: 2.6 g/dL — ABNORMAL LOW (ref 3.5–5.0)
Alkaline Phosphatase: 90 U/L (ref 38–126)
Anion gap: 9 (ref 5–15)
BUN: 13 mg/dL (ref 6–20)
CO2: 21 mmol/L — ABNORMAL LOW (ref 22–32)
Calcium: 8.4 mg/dL — ABNORMAL LOW (ref 8.9–10.3)
Chloride: 101 mmol/L (ref 98–111)
Creatinine, Ser: 1.13 mg/dL (ref 0.61–1.24)
GFR, Estimated: >60 mL/min (ref >60)
Glucose, Bld: 114 mg/dL — ABNORMAL HIGH (ref 70–99)
Potassium: 3.8 mmol/L (ref 3.5–5.1)
Sodium: 131 mmol/L — ABNORMAL LOW (ref 135–145)
Total Bilirubin: 0.7 mg/dL (ref 0.3–1.2)
Total Protein: 6.5 g/dL (ref 6.5–8.1)

## 2020-11-29 MED ORDER — ARIPIPRAZOLE 5 MG PO TABS
5.0000 mg | ORAL_TABLET | Freq: Every evening | ORAL | Status: DC
  Administered 2020-11-29 – 2020-12-10 (×12): 5 mg via ORAL
  Filled 2020-11-29 (×12): qty 1

## 2020-11-29 MED ORDER — INSULIN ASPART 100 UNIT/ML ~~LOC~~ SOLN
0.0000 [IU] | Freq: Three times a day (TID) | SUBCUTANEOUS | Status: DC
  Administered 2020-11-30: 1 [IU] via SUBCUTANEOUS
  Administered 2020-12-01: 3 [IU] via SUBCUTANEOUS
  Administered 2020-12-01: 5 [IU] via SUBCUTANEOUS
  Administered 2020-12-01: 3 [IU] via SUBCUTANEOUS
  Administered 2020-12-02: 2 [IU] via SUBCUTANEOUS
  Administered 2020-12-02: 3 [IU] via SUBCUTANEOUS
  Administered 2020-12-03: 2 [IU] via SUBCUTANEOUS
  Administered 2020-12-03 (×2): 5 [IU] via SUBCUTANEOUS
  Administered 2020-12-04: 2 [IU] via SUBCUTANEOUS
  Administered 2020-12-05: 5 [IU] via SUBCUTANEOUS
  Administered 2020-12-05: 3 [IU] via SUBCUTANEOUS
  Administered 2020-12-05: 5 [IU] via SUBCUTANEOUS
  Administered 2020-12-06: 1 [IU] via SUBCUTANEOUS
  Administered 2020-12-06 (×2): 2 [IU] via SUBCUTANEOUS

## 2020-11-29 MED ORDER — ACETAMINOPHEN 500 MG PO TABS
1000.0000 mg | ORAL_TABLET | Freq: Four times a day (QID) | ORAL | Status: DC
  Administered 2020-11-29 – 2020-12-02 (×11): 1000 mg via ORAL
  Administered 2020-12-03: 500 mg via ORAL
  Administered 2020-12-03 – 2020-12-04 (×4): 1000 mg via ORAL
  Filled 2020-11-29 (×17): qty 2

## 2020-11-29 MED ORDER — MIRTAZAPINE 15 MG PO TABS
30.0000 mg | ORAL_TABLET | Freq: Every day | ORAL | Status: DC
  Administered 2020-11-29 – 2020-12-09 (×11): 30 mg via ORAL
  Filled 2020-11-29 (×11): qty 2

## 2020-11-29 MED ORDER — FLUCONAZOLE 100 MG PO TABS: 100.0000 mg | ORAL_TABLET | Freq: Every day | ORAL | Status: DC

## 2020-11-29 MED ORDER — PROPRANOLOL HCL ER 80 MG PO CP24
80.0000 mg | ORAL_CAPSULE | Freq: Every day | ORAL | Status: DC
  Administered 2020-11-30 – 2020-12-10 (×11): 80 mg via ORAL
  Filled 2020-11-29 (×11): qty 1

## 2020-11-29 MED ORDER — MOMETASONE FURO-FORMOTEROL FUM 200-5 MCG/ACT IN AERO
2.0000 | INHALATION_SPRAY | Freq: Two times a day (BID) | RESPIRATORY_TRACT | Status: DC
  Administered 2020-11-30 – 2020-12-10 (×18): 2 via RESPIRATORY_TRACT
  Filled 2020-11-29: qty 8.8

## 2020-11-29 MED ORDER — IBUPROFEN 200 MG PO TABS
400.0000 mg | ORAL_TABLET | Freq: Three times a day (TID) | ORAL | Status: DC
  Administered 2020-11-29: 200 mg via ORAL
  Filled 2020-11-29: qty 2

## 2020-11-29 MED ORDER — MONTELUKAST SODIUM 10 MG PO TABS
10.0000 mg | ORAL_TABLET | Freq: Every morning | ORAL | Status: DC
  Administered 2020-11-30 – 2020-12-10 (×10): 10 mg via ORAL
  Filled 2020-11-29 (×10): qty 1

## 2020-11-29 MED ORDER — DULOXETINE HCL 60 MG PO CPEP
60.0000 mg | ORAL_CAPSULE | Freq: Every day | ORAL | Status: DC
  Administered 2020-11-30 – 2020-12-10 (×10): 60 mg via ORAL
  Filled 2020-11-29 (×10): qty 1

## 2020-11-29 MED ORDER — OXYCODONE HCL 5 MG PO TABS
5.0000 mg | ORAL_TABLET | ORAL | Status: DC | PRN
  Administered 2020-11-29 – 2020-12-01 (×8): 5 mg via ORAL
  Filled 2020-11-29 (×8): qty 1

## 2020-11-29 MED ORDER — GABAPENTIN 300 MG PO CAPS
600.0000 mg | ORAL_CAPSULE | Freq: Three times a day (TID) | ORAL | Status: DC
  Administered 2020-11-29 – 2020-12-10 (×32): 600 mg via ORAL
  Filled 2020-11-29 (×32): qty 2

## 2020-11-29 MED ORDER — ATORVASTATIN CALCIUM 40 MG PO TABS: 40.0000 mg | ORAL_TABLET | Freq: Every day | ORAL | Status: DC

## 2020-11-29 MED ORDER — ALBUTEROL SULFATE (2.5 MG/3ML) 0.083% IN NEBU: 2.5000 mg | INHALATION_SOLUTION | RESPIRATORY_TRACT | Status: DC | PRN

## 2020-11-29 NOTE — Progress Notes (Signed)
Patient ID: Christopher Fernandez, male   DOB: [birth year], 50 y.o.   MRN: [id number] BP 111/83 (BP Location: Right Arm)   Pulse 77   Temp 98.1 F (36.7 C) (Oral)   Resp 16   SpO2 97%  I have reviewed the films, there are no neurosurgical issues at this time. No epidural masses, fluid collections, or the like. Recommend speaking with Interventional radiology to obtain a tissue sample from the inflamed vertebra. Would hold abx until sample obtained. Also recommend ID consult. Would also obtain a baseline CT thoracic spine to ascertain a baseline for structural integrity at this time.

## 2020-11-29 NOTE — Progress Notes (Unsigned)
I spoke with PA, Ellouise Newer,  from Dr Celesta Aver office ([facility]) about Mr Bowland.  They informed me Mr Mayall had an abdominal MRI MRCP w/wo contrast for w/u chronic pancreatitis pain and  thoracic tenderness of "thoracic vertebrae" on 11/18/20 physical exam by Ellouise Newer, PA for Dr Carlean Purl.   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    A thoracic MRI w/wo contrast was recommended by radiology to further evaluate the findings.  Today, Mr Leisinger's primary, Dr Tarry Kos, ordered the thoracic MRI (with and without).  His neurological function is presumed normal, but will ask that he be seen at [facility] afternoon access-to-care clinic to evaluate the spinal neurologic function.  If his neurologic function is normal and Mr Sherk has both a means of accessing immediate health care should he develop neurologic symptoms and he has a means of transportation to attend the thoracic MRI, then this work-up may proceed as outpatient.  Should these abnormalities prove on the dedicated  If he does not meet any of the three requirements, then referral of the patient to the ED for evaluation and recommendations may be prudent.     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

## 2020-11-29 NOTE — H&P (Addendum)
[facility] Admission History and Physical Service Pager: [phone]  Patient name: Christopher Fernandez Medical record number: [id number] Date of birth: [birth year] Age: 50 y.o. Gender: male  Primary Care Provider: Danna Hefty, DO Consultants: Neurosurgery Code Status: FULL  Preferred Emergency Contact:  Contact Information    Name Relation Home Work [place] Spouse [phone]  [phone]      Chief Complaint: Back pain  Assessment and Plan: Christopher Fernandez is a 50 y.o. male presenting with back pain. PMH is significant for cellulitis, diabetes, neuropathy, anemia, hyperlipidemia, asthma, alcohol abuse, tobacco use disorder, trauma, cholecystectomy, asplenia, anxiety and depression.  Concern for osteomyelitis versus discitis of T9-T10 Christopher Fernandez is a 50 year old male who presents directly from [facility] clinic with back pain.  Patient was seen by GI recently who had ordered MRCP for chronic pancreatitis.  MRCP 11/29/2018 showed acute changes involving T9 and T10 vertebra concerning for discitis/vertebral osteomyelitis. Dr. Clovis Riley radiology recommended ordering MRI thoracic spine with contrast.  Patient was admitted to the hospital for further work-up.  Vital signs stable on admission on examination labs pending.  On examination: no spinal tenderness, tender to palpation on the right thoracic area lateral to T9/T10, no erythema or open wounds in the area. Consideration that this could be osteomyelitis versus discitis, we will get MRI of the thoracic spine to confirm diagnosis.  Other considerations would be neoplasm, though the acute findings do not lead to this diagnosis it must put in the differential diagnosis considering recent weight loss and night sweats. Considered pyelonephritis given location of pain and night sweats however pt denies urinary sx. UA pending. Also considered spinal Tb however lower on differential.  Discussed case with Dr  Cabell-Neurosurgery regarding intervention who recommended speaking with IR to obtain a tissue sample from the inflamed vertebra, holding antibiotics until sample obtained and ID consult. Will admit for further work up -Admit to [facility], Attending Dr. Andria Frames -Up with assistance  -Vitals per floor routine -Follow-up CMP, CBC -Follow up blood cultures  -MRI thoracic spine with contrast -Am consult with IR for biopsy -Am consult with ID for management -Carb modified diet  -NPO MN -Holding DVT prophylaxis incase of  IR procedure 3/19 -Holding antibiotics for now until bone biopsy is back  -Analgesia-scheduled 1000mg  tylenol Q6H and ibuprofen 400mg  TID  Diabetic Foot wound Patient has left second toe foot wound that has been present x1 week.  Patient reports no pain, has little sensation in his feet.  He is s/p amputation of all 5 of his right toes and his left great toe. See photos below. -Wound care consult placed -Foot MR to r/o osteomyelitis  Weight loss Patient weight currently 249 lbs, on 3/7 it was documented at 260 lbs-unsure if this measurement is accurate the patient does report a loss of 12 pounds in the last several weeks.  Consideration that in the light of having night sweats weight loss a remote chance of cancer.  Otherwise abilities could be secondary to decreased appetite given patient's possible infection and recent abdominal pain. -Continue to monitor  T2DM with neuropathy: Chronic, stable Last Hgb A1c 9.7. on 10/23/20. Home meds: Jardiance 25 mg qd, metformin 1000 mg twice daily, sitagliptin 100 mg daily, Basaglar 20 units every morning and Humalog sliding scale 2 to 4 units 3 times daily.  Gabapentin 300 mg 3 times daily. -CBG before meals and nightly -Sensitive sliding scale insulin -Holding home medications while n.p.o. (Jardiance, linagliptin, Metformin, gabapentin) we will  restart medications if appropriate with diet  -Consider Ozempic outpatient for better glycemic  control -Consider diabetes educator  Esophageal candidiasis  EGD completed on 3/9 found signs of concerns for esophageal candidiasis and was started on fluconazole on 3/16 and to be continued for 21 days. - Holding diflucan 100mg  daily until confirmation and so that it will does not interfere with biopsy results - Hold atorvastatin while on diflucan  -Monitor sx  Pancreatic insufficiency  chronic pancreatitis Patient recently underwent MRCP for chronic pancreatitis with persistent abdominal pain, which is where the findings for the concern for osteomyelitis were discovered.  Due to pancreatic insufficiency, patient is currently on enzyme supplementation.  Patient has epigastric/RUQ pain which is chronic.  -We will need to determine which dosage of pancreatic enzymes will be appropriate for patient as his home medication is nonformulary with his diet is resumed.  Hx of Anemia: stable  CBC pending today. Recent Hb 13.9  - Monitor with CBC   Hyperlipidemia: Chronic, stable Most recent lipid panel 12/12/2019 unremarkable.  Home medications: atorvastatin 40 mg daily. -Continue home atorvastatin daily  Hx asthma: Chronic, stable Home medications: include albuterol as needed, Singulair 10 mg daily  - Dulera, per formulary - Albuterol PRN  Hx alcohol abuse  Tobacco use disorder Has been abstinent from alcohol for the last 14 months.  Currently smoking 1/2 pack/day, previous history of 1 to 2 packs/day for 10 to 12 years.  About a 35-pack-year smoker. -Encourage smoking cessation  Hx Tremor: chronic, stable Home meds: propranolol ER80 mg endorses tremor on right hand when he writes.  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 Not on prophylactic antibiotics. At risk for infections from encapsulated bacteria. Has had both  COVID-19 vaccines, pneumococcal and Flu vaccine. -Appears that patient has not had Meningococal vaccine  Anxiety with Depression: Chronic, stable Home meds: aripiprazole 5 mg daily, Cymbalta 60 mg, mirtazapine 30 mg. - Continue home medications    FEN/GI: Sips with meds, NPO MN Prophylaxis: SCDs  Disposition: Med Surg   History of Present Illness:  Christopher Fernandez is a 50 y.o. male presenting with back pain.  Patient reports that he has been having 6 weeks of lower abdominal pain and then noted that he was having thoracic back pain as well.  He states that the pain "feels like a knot" and worsens with certain movements including twisting to the left, bending over, standing up.  He states it is difficult to get comfortable enough to sleep.  He has noticed the last several weeks he has had chills and has been diaphoretic during the day.  He does report a weight loss of 12 pounds in the last 2 weeks and has been feeling more fatigued than usual.  Patient has had no urinary or bowel incontinence and does not report any weakness associated with this.  He does report some radiation a little bit laterally of the area and sometimes soreness radiating down towards his buttock.  He reports his pain has not been managed home physical medications Tylenol, ibuprofen, diclofenac.  Patient also has diabetic foot ulcer on the left second toe that is been present for 1 week, he states he has been dressing himself and last changed the dressing on 3/17.  He has no pain and no issues with the area.  Patient does note he has had a change in  his cough of the last 3 weeks.  Normally his cough is a "dry smoker's cough", but has been productive with clear sputum.  Patient does report some episodes of dizziness in the last couple of weeks, but has not had any in the last week.  Denies drinking ETOH. Last drink was 14 months ago. Smoked half a pack a day. Smoked >35 years. Denies recreational drug use.     Review Of  Systems: Per HPI with the following additions:   Review of Systems  Constitutional: Positive for chills and diaphoresis. Negative for fever.  HENT: Negative for congestion, rhinorrhea and sore throat.   Eyes: Negative for visual disturbance.  Respiratory: Positive for cough. Negative for chest tightness and shortness of breath.   Cardiovascular: Negative for chest pain and palpitations.  Gastrointestinal: Positive for abdominal pain. Negative for constipation, diarrhea, nausea and vomiting.  Genitourinary: Positive for difficulty urinating.  Musculoskeletal: Positive for back pain.  Neurological: Positive for tremors. Negative for syncope, weakness, light-headedness, numbness and headaches.       Essential tremor     Patient Active Problem List   Diagnosis Date Noted  . Osteomyelitis ([place]) 11/29/2020  . Chronic pancreatitis ([place]) 10/27/2020  . Epigastric pain 10/23/2020  . Abnormal ankle brachial index (ABI)   . Pyogenic inflammation of bone ([place])   . 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 Hyperlipidemia associated with type 2 diabetes mellitus ([place]) 03/15/2020  . MRSA infection 01/11/2020  . 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[place]) 01/10/2020  . S/P transmetatarsal amputation of foot, right ([place]) 01/09/2020  . Asplenia 12/13/2019  . Exocrine pancreatic insufficiency 06/14/2018  . Uncontrolled diabetes mellitus with hyperglycemia, with long-term current use of insulin ([place]) 02/24/2018  . Alcohol use disorder, severe, dependence ([place]) 02/24/2018  . Anxiety associated with depression 02/24/2018  . GERD (gastroesophageal reflux disease) 02/24/2018  . Anemia 02/24/2018  . Asthma, intermittent 02/24/2018  . Tobacco use disorder 02/24/2018  . Benign prostatic hyperplasia 12/30/2015  . Chronic obstructive pulmonary disease ([place]) 12/30/2015  . Diabetic neuropathy ([place]) 12/30/2015  . Restless legs syndrome 12/30/2015  . Long term current use of insulin ([place]) 08/05/2015  . ED (erectile dysfunction) of organic  origin 05/23/2015  . Benign essential hypertension 02/16/2014  . Extrinsic asthma without status asthmaticus 02/16/2014    Past Medical History: Past Medical History:  Diagnosis Date  . Alcoholism ([place])   . Anemia   . Anxiety   . Arthritis   . Asthma   . Blood transfusion without reported diagnosis   . COPD (chronic obstructive pulmonary disease) ([place])   . Depression   . Diabetes ([place])    type 2  . GERD (gastroesophageal reflux disease)   . History of hiatal hernia   . HTN (hypertension)   . Neuromuscular disorder (HCC)    tremors  . Osteomyelitis ([place])    right forefoot  . Pancreatitis   . Pneumonia   . Substance abuse ([place])   . 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 Wears glasses     Past Surgical History: Past Surgical History:  Procedure Laterality Date  . 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 AMPUTATION Right 10/18/2019   Procedure: RIGHT SECOND TOE AMPUTATION;  Surgeon: Newt Minion, MD;  Location: [place];  Service: Orthopedics;  Laterality: Right;  . AMPUTATION Right 11/08/2019   Procedure: RIGHT TRANSMETATARSAL AMPUTATION;  Surgeon: Newt Minion, MD;  Location: [place];  Service: Orthopedics;  Laterality: Right;  . APPLICATION OF WOUND  VAC Right 01/10/2020   Procedure: APPLICATION OF WOUND VAC, right foot;  Surgeon: [name], MD;  Location: [place];  Service: Vascular;  Laterality: Right;  . CHOLECYSTECTOMY  2006   with gallbladder and spleen  . COLONOSCOPY  8-10 years ago    in [place] exam per pt  . HERNIA REPAIR  2008, 2010   hiatal hernia and 1 additional  . NASAL SEPTUM SURGERY    . PANCREATIC PSEUDOCYST DRAINAGE    . SPLENECTOMY  2006  . STUMP REVISION Right 11/24/2019   Procedure: REVISION RIGHT TRANSMETATARSAL AMPUTATION;  Surgeon: Newt Minion, MD;  Location: [place];  Service: Orthopedics;  Laterality: Right;  . TOTAL KNEE ARTHROPLASTY Right 1982, 1984  . TRANSMETATARSAL AMPUTATION Left 09/06/2020   Procedure:  AMPUTATION LEFT GREAT TOE;  Surgeon: Waynetta Sandy, MD;  Location: ;  Service: Vascular;  Laterality: Left;  . WOUND DEBRIDEMENT Right 01/10/2020   Procedure: incisional DEBRIDEMENT of RIGHT TRANSMETATARSAL WOUND;  Surgeon: [name], MD;  [facility];  Service: Vascular;  Laterality: Right;    Social History: Social History   Tobacco Use  . Smoking status: Current Every Day Smoker    Packs/day: 1.00    Years: 38.00    Pack years: 38.00    Types: Cigarettes    Start date: 09/14/1982  . Smokeless tobacco: Never Used  . Tobacco comment: tobacco info given to patient  Vaping Use  . Vaping Use: Never used  Substance Use Topics  . Alcohol use: Not Currently    Comment: 2-3 per day, none since 2019  . Drug use: Not Currently   Additional social history:   Please also refer to relevant sections of EMR.  Family History: Family History  Problem Relation Age of Onset  . Diabetes Mother   . Hypertension Mother   . Hyperlipidemia Mother   . Kidney disease Mother   . Thyroid disease Mother   . Breast cancer Mother        mets  . Lung cancer Mother   . Diabetes Father   . Alcohol abuse Sister   . Sickle cell trait Sister   . Diabetes Brother   . Asthma Brother   . Lung cancer Maternal Grandmother   . Kidney disease Maternal Grandmother   . Liver cancer Maternal Uncle        x 4-5  . Kidney disease Maternal Uncle   . Kidney disease Paternal Uncle   . Colon cancer Neg Hx   . Colon polyps Neg Hx   . Esophageal cancer Neg Hx   . Rectal cancer Neg Hx   . Stomach cancer Neg Hx     Allergies and Medications: Allergies  Allergen Reactions  . Eggs Or Egg-Derived Products Rash  . Morphine And Related Other (See Comments)    Cant take because of pancreatitis  . Cocoa Rash   No current facility-administered medications on file prior to encounter.   Current Outpatient Medications on File Prior to Encounter  Medication Sig Dispense Refill  .  acetaminophen (TYLENOL 8 HOUR) 650 MG CR tablet Take 1 tablet (650 mg total) by mouth every 8 (eight) hours as needed for pain.    [name] ADVAIR DISKUS 250-50 MCG/DOSE AEPB INHALE 1 PUFF INTO THE LUNGS 2 TIMES DAILY. (Patient taking differently: Inhale 1 puff into the lungs in the morning and at bedtime.) 180 each 3  . albuterol (VENTOLIN HFA) 108 (90 Base) MCG/ACT inhaler INHALE 2-4 PUFFS INTO THE  LUNGS EVERY 4 (FOUR) HOURS AS NEEDED FOR WHEEZING (OR COUGH). 18 g 11  . ARIPiprazole (ABILIFY) 5 MG tablet TAKE 1 TABLET (5 MG TOTAL) BY MOUTH DAILY. (Patient taking differently: Take 5 mg by mouth every evening.) 90 tablet 2  . atorvastatin (LIPITOR) 40 MG tablet Take 1 tablet (40 mg total) by mouth daily. 90 tablet 3  . diclofenac (VOLTAREN) 50 MG EC tablet Take 1 tablet (50 mg total) by mouth 3 (three) times daily. 90 tablet 0  . DULoxetine (CYMBALTA) 60 MG capsule TAKE 1 CAPSULE (60 MG TOTAL) BY MOUTH DAILY. 90 capsule 2  . fluconazole (DIFLUCAN) 100 MG tablet Take 1 tablet (100 mg total) by mouth daily. 21 tablet 0  . fluticasone-salmeterol (ADVAIR HFA) 115-21 MCG/ACT inhaler Inhale 1 puff into the lungs 2 (two) times daily.    [name] gabapentin (NEURONTIN) 300 MG capsule Take 2 capsules (600 mg total) by mouth 3 (three) times daily. 180 capsule 2  . Insulin Glargine-yfgn 100 UNIT/ML SOPN Inject 18 Units into the skin in the morning. 15 mL 1  . insulin lispro (HUMALOG KWIKPEN) 100 UNIT/ML KwikPen Inject 6-10 Units into the skin 3 (three) times daily. 15 mL 11  . JANUVIA 100 MG tablet TAKE 1 TABLET (100 MG TOTAL) BY MOUTH DAILY. 90 tablet 10  . JARDIANCE 25 MG TABS tablet TAKE 1 TABLET BY MOUTH DAILY. 90 tablet 3  . metFORMIN (GLUCOPHAGE) 1000 MG tablet TAKE 1 TABLET (1,000 MG TOTAL) BY MOUTH 2 (TWO) TIMES DAILY WITH A MEAL. 180 tablet 3  . mirtazapine (REMERON) 30 MG tablet Take 1 tablet (30 mg total) by mouth at bedtime. 90 tablet 3  . montelukast (SINGULAIR) 10 MG tablet TAKE 1 TABLET (10 MG TOTAL) BY MOUTH  AT BEDTIME. (Patient taking differently: Take 10 mg by mouth in the morning.) 90 tablet 11  . PANCRELIPASE, LIP-PROT-AMYL, PO Take 36,000 Units by mouth with breakfast, with lunch, and with evening meal.    . pantoprazole (PROTONIX) 40 MG tablet Take 1 tablet (40 mg total) by mouth 2 (two) times daily. 60 tablet 0  . propranolol ER (INDERAL LA) 80 MG 24 hr capsule Take 1 capsule (80 mg total) by mouth daily. 90 capsule 1  . bismuth subsalicylate (PEPTO BISMOL) 262 MG/15ML suspension Take 30 mLs by mouth every 6 (six) hours as needed for indigestion or diarrhea or loose stools.    . cetirizine (ZYRTEC) 10 MG tablet Take 1 tablet (10 mg total) by mouth daily. (Patient taking differently: Take 10 mg by mouth daily as needed for allergies.) 30 tablet 11  . Continuous Blood Gluc Receiver (DEXCOM G6 RECEIVER) DEVI 1 Device by Does not apply route 4 (four) times daily. 1 each 11  . Continuous Blood Gluc Transmit (DEXCOM G6 TRANSMITTER) MISC 4 (four) times daily. as directed    . cyclobenzaprine (FLEXERIL) 5 MG tablet Take 1 tablet (5 mg total) by mouth 2 (two) times daily as needed for muscle spasms. (Patient not taking: No sig reported) 20 tablet 0  . EASY TOUCH PEN NEEDLES 31G X 5 MM MISC Inject 1 application as directed 3 (three) times daily. 100 each 11  . FREESTYLE LITE test strip USE AS INSTRUCTED 100 strip 12  . glucose blood test strip USE AS INSTRUCTED    . glucose blood test strip USE AS INSTRUCTED    . Lancets (FREESTYLE) lancets Use as instructed 100 each 12  . oxyCODONE-acetaminophen (PERCOCET) 10-325 MG tablet Take 1 tablet by mouth every 6 (  six) hours as needed for up to 15 days for pain (for breakthrough pain). (Patient not taking: No sig reported) 15 tablet 0  . sucralfate (CARAFATE) 1 GM/10ML suspension Take 10 mLs (1 g total) by mouth 4 (four) times daily. (Patient not taking: No sig reported) 420 mL 0    Objective: BP 111/83 (BP Location: Right Arm)   Pulse 77   Temp 98.1 F (36.7  C) (Oral)   Resp 16   SpO2 97%  Exam: General -- oriented x3, pleasant and cooperative. HEENT -- Head is normocephalic. PERRLA. EOMI. Ears, nose and throat were benign. Neck -- supple; no bruits. Integument --diabetic foot wound noted on the left second toe (see picture below) Chest -- good expansion. Lungs clear to auscultation.  Comfortable on room air, no increased work of breathing Cardiac -- RRR. No murmurs noted.  Abdomen -- soft, nontender. No masses palpable. Bowel sounds present. Back-- tender to palpation on the right thoracic area lateral to T9/T10, no erythema or open wounds in the area. Some limitation of ROM with forward bending secondary to pain. CNS -- cranial nerves II through XII grossly intact. Extremeties - no tenderness or effusions noted. 5/5 bilateral strength, though some difficulty with lifting the right leg secondary to pain. Dorsalis pedis pulses present and symmetrical.  Left great toe has diabetic foot with mild effusion         Labs and Imaging: CBC BMET  No results for input(s): WBC, HGB, HCT, PLT in the last 168 hours. No results for input(s): NA, K, CL, CO2, BUN, CREATININE, GLUCOSE, CALCIUM in the last 168 hours.    [name] PGY-1, [place] Intern pager: [phone], text pages welcome  FPTS Upper-Level Resident Addendum   I have independently interviewed and examined the patient. I have discussed the above with the original author and agree with their documentation. My edits for correction/addition/clarification are in black. Please see also any attending notes.   Lattie Haw MD PGY-2, [facility] 11/29/2020 7:56 PM  [place] [facility] pager: [phone] (text pages welcome through [facility] [facility])

## 2020-11-29 NOTE — Progress Notes (Addendum)
    SUBJECTIVE:   CHIEF COMPLAINT / HPI: f/u for back pain/ abnormal    Patient reports he has been experiencing back pain for the last few weeks that initially began as abdominal pain. He reports being diagnosed with chronic pancreatitis and recently underwent MRCP on 3/17. Results were notable for filling defects in CBD, chronic changes to pancreas 2/2 to inflammation and T9 and T10 changes concerning for osteomyelitis/discitis.  Patient reports limited range of motion with flexion extension of his back due to the pain. He also reports some chills two nights ago and frequently sweating. He has not had nausea, emesis, and has irregular bowel movements due to his pancreatic disease.   PERTINENT  PMH / PSH:  Hypertension Asthma COPD Pancreatic insufficiency Chronic pancreatitis Diabetes History of MRSA infection Alcohol use disorder  OBJECTIVE:   BP 110/62   Pulse 90   Wt 249 lb (112.9 kg)   SpO2 94%   BMI 31.12 kg/m   General: Male appearing stated age, uncomfortable appearing, diaphoretic  Back: No gross deformity, scoliosis. TTP .  No midline TTP, right sided thoracic bony TTP. Limited ROM, unable to flex 2/2 to pain Strength LEs limited 2/2 pain  Sensation intact to light touch bilaterally. Skin: no overlying skin changes  ASSESSMENT/PLAN:   Osteomyelitis (HCC) Concerning findings on MRCP, potential discitis versus osteomyelitis. Recommend admission to the hospital for neurosurgery or orthopedic evaluation and consideration for biopsy Patient will need IV pain management      Eulis Foster, MD [place]

## 2020-11-29 NOTE — Progress Notes (Signed)
Called Dr Arneta Cliche regarding bone findings at T9-T10. He said there is no indication for neurosurgery. We need to consult IR for a biopsy to take a sample of the bone and he also recommended ID consult. We should call neurosurgery if he fails conservative treatment.  Lattie Haw MD [place]

## 2020-11-29 NOTE — Telephone Encounter (Signed)
Received VM on nurse line with critical imaging findings. Per chart review and message, there were findings that could be indicative of osteomyelitis in thoracic spine.   Recommending MRI with contrast be placed by PCP.   Provider paged at this time. Spoke with Dr. Tarry Kos who is placing order for MRI with contrast.   Dr. McDiarmid is recommending that patient be seen in clinic today. Scheduled in ATC this afternoon at 3:10. Patient called and informed. Patient states that his wife is out of town and does not have transportation. [place] Transportation department to facilitate ride into office. Driver will pick patient up at home address at around 2:26.   Jazmin made aware of STAT imaging request.   Talbot Grumbling, RN

## 2020-11-29 NOTE — Assessment & Plan Note (Addendum)
Concerning findings on MRCP, potential discitis versus osteomyelitis. Recommend admission to the hospital for neurosurgery or orthopedic evaluation and consideration for biopsy Patient will need IV pain management

## 2020-11-29 NOTE — Telephone Encounter (Signed)
Patient evaluated and access to care clinic.  Discussed extensively with Dr. Nori Riis and patient in agreement with being directly admitted to [facility].  Will work with patient placement to request bed. Recommend patient be admitted to [facility] with evaluation by neurosurgery or orthopedics for consideration of biopsy for discitis/osteomyelitis to further guide antibiotic therapy as well as start pain management. MRI was scheduled for Sunday 12/01/20, however, will admit patient at this time and complete as inpatient if clinically appropriate.   Eulis Foster, MD [place], PGY-2 [phone]

## 2020-11-29 NOTE — Telephone Encounter (Signed)
I left a detailed message for Dr. Tarry Kos' nurse with the information below and urgency of the request. I was also able to provide the information to Dr. Sherren Mocha McDiarmid. He states that they will order the additional MRI and will contact patient to follow up. Anderson Malta and Dr. Carlean Purl are aware. All information from PCP is in epic.

## 2020-11-29 NOTE — Telephone Encounter (Signed)
[facility]  Received a call from Dr. Clovis Riley in radiology regarding MRI which was completed this morning.  This does show some osteomyelitis in the thoracic spine.  He recommended follow-up with MRI of the thoracic spine with contrast.  I have asked my nurse to reach out to patient's primary care provider urgently so that they can order this and follow-up with patient.  This may be the cause of all his continued abdominal pain.  Ellouise Newer, PA-C

## 2020-11-30 ENCOUNTER — Inpatient Hospital Stay (HOSPITAL_COMMUNITY): Payer: 59

## 2020-11-30 DIAGNOSIS — M8619 Other acute osteomyelitis, multiple sites: Secondary | ICD-10-CM | POA: Diagnosis not present

## 2020-11-30 DIAGNOSIS — R1013 Epigastric pain: Secondary | ICD-10-CM | POA: Diagnosis not present

## 2020-11-30 LAB — CBC
HCT: 32.9 % — ABNORMAL LOW (ref 39.0–52.0)
Hemoglobin: 11.1 g/dL — ABNORMAL LOW (ref 13.0–17.0)
MCH: 28.8 pg (ref 26.0–34.0)
MCHC: 33.7 g/dL (ref 30.0–36.0)
MCV: 85.5 fL (ref 80.0–100.0)
Platelets: 474 10*3/uL — ABNORMAL HIGH (ref 150–400)
RBC: 3.85 MIL/uL — ABNORMAL LOW (ref 4.22–5.81)
RDW: 13.7 % (ref 11.5–15.5)
WBC: 10.7 10*3/uL — ABNORMAL HIGH (ref 4.0–10.5)
nRBC: 0 % (ref 0.0–0.2)

## 2020-11-30 LAB — BLOOD CULTURE ID PANEL (REFLEXED) - BCID2

## 2020-11-30 LAB — BASIC METABOLIC PANEL
Anion gap: 9 (ref 5–15)
BUN: 10 mg/dL (ref 6–20)
CO2: 25 mmol/L (ref 22–32)
Calcium: 8.5 mg/dL — ABNORMAL LOW (ref 8.9–10.3)
Chloride: 101 mmol/L (ref 98–111)
Creatinine, Ser: 0.99 mg/dL (ref 0.61–1.24)
GFR, Estimated: >60 mL/min (ref >60)
Glucose, Bld: 136 mg/dL — ABNORMAL HIGH (ref 70–99)
Potassium: 4 mmol/L (ref 3.5–5.1)
Sodium: 135 mmol/L (ref 135–145)

## 2020-11-30 MED ORDER — LIDOCAINE 5 % EX PTCH
1.0000 | MEDICATED_PATCH | CUTANEOUS | Status: DC
  Administered 2020-11-30 – 2020-12-10 (×11): 1 via TRANSDERMAL
  Filled 2020-11-30 (×12): qty 1

## 2020-11-30 MED ORDER — KETOROLAC TROMETHAMINE 15 MG/ML IJ SOLN
15.0000 mg | Freq: Four times a day (QID) | INTRAMUSCULAR | Status: DC | PRN
  Administered 2020-11-30 (×2): 15 mg via INTRAVENOUS
  Filled 2020-11-30 (×2): qty 1

## 2020-11-30 MED ORDER — GADOBUTROL 1 MMOL/ML IV SOLN
10.0000 mL | Freq: Once | INTRAVENOUS | Status: AC | PRN
  Administered 2020-11-30: 10 mL via INTRAVENOUS

## 2020-11-30 MED ORDER — VANCOMYCIN HCL 1250 MG/250ML IV SOLN
1250.0000 mg | Freq: Two times a day (BID) | INTRAVENOUS | Status: DC
  Administered 2020-12-01 – 2020-12-03 (×6): 1250 mg via INTRAVENOUS
  Filled 2020-11-30 (×7): qty 250

## 2020-11-30 MED ORDER — PIPERACILLIN-TAZOBACTAM 3.375 G IVPB
3.3750 g | Freq: Three times a day (TID) | INTRAVENOUS | Status: DC
  Administered 2020-11-30 – 2020-12-01 (×3): 3.375 g via INTRAVENOUS
  Filled 2020-11-30 (×3): qty 50

## 2020-11-30 MED ORDER — KETOROLAC TROMETHAMINE 15 MG/ML IJ SOLN
15.0000 mg | Freq: Once | INTRAMUSCULAR | Status: AC
  Administered 2020-11-30: 15 mg via INTRAVENOUS
  Filled 2020-11-30: qty 1

## 2020-11-30 MED ORDER — KETOROLAC TROMETHAMINE 30 MG/ML IJ SOLN
30.0000 mg | Freq: Four times a day (QID) | INTRAMUSCULAR | Status: DC | PRN
  Administered 2020-11-30 – 2020-12-04 (×12): 30 mg via INTRAVENOUS
  Filled 2020-11-30 (×12): qty 1

## 2020-11-30 MED ORDER — VANCOMYCIN HCL 1500 MG/300ML IV SOLN
1500.0000 mg | Freq: Once | INTRAVENOUS | Status: AC
  Administered 2020-11-30: 1500 mg via INTRAVENOUS
  Filled 2020-11-30: qty 300

## 2020-11-30 NOTE — Consult Note (Signed)
Referring Physician: Family practice service  Patient name: Christopher Fernandez MRN: [id number] DOB: 1970/09/30 Sex: male  REASON FOR CONSULT: osteomyelitis left 2nd toe  HPI: Christopher Fernandez is a 50 y.o. male, several day history of worsening wound left 2nd toe with drainage.  Has previously had amputation of left first toe by Dr Donzetta Matters and right transmet by Dr Carlis Abbott.  He is also admitted with osteo of cspine. Other medical problems include COPD, diabetes, hypertension all of which are stable.  He also smokes about 1 PPD.  Past Medical History:  Diagnosis Date  . Alcoholism ([place])   . Anemia   . Anxiety   . Arthritis   . Asthma   . Blood transfusion without reported diagnosis   . COPD (chronic obstructive pulmonary disease) ([place])   . Depression   . Diabetes ([place])    type 2  . GERD (gastroesophageal reflux disease)   . History of hiatal hernia   . HTN (hypertension)   . Neuromuscular disorder ([facility])    tremors  . Osteomyelitis ([place])    right forefoot  . Pancreatitis   . Pneumonia   . Substance abuse ([place])   . 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 Wears glasses    Past Surgical History:  Procedure Laterality Date  . 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 AMPUTATION Right 10/18/2019   Procedure: RIGHT SECOND TOE AMPUTATION;  Surgeon: Newt Minion, MD;  Location: [place];  Service: Orthopedics;  Laterality: Right;  . AMPUTATION Right 11/08/2019   Procedure: RIGHT TRANSMETATARSAL AMPUTATION;  Surgeon: Newt Minion, MD;  Location: [place];  Service: Orthopedics;  Laterality: Right;  . APPLICATION OF WOUND VAC Right 01/10/2020   Procedure: APPLICATION OF WOUND VAC, right foot;  Surgeon: [name], MD;  Location: [place];  Service: Vascular;  Laterality: Right;  . CHOLECYSTECTOMY  2006   with gallbladder and spleen  . COLONOSCOPY  8-10 years ago    in [place] exam per pt  . HERNIA REPAIR  2008, 2010   hiatal hernia and 1 additional  . NASAL SEPTUM  SURGERY    . PANCREATIC PSEUDOCYST DRAINAGE    . SPLENECTOMY  2006  . STUMP REVISION Right 11/24/2019   Procedure: REVISION RIGHT TRANSMETATARSAL AMPUTATION;  Surgeon: Newt Minion, MD;  Location: [place] [facility];  Service: Orthopedics;  Laterality: Right;  . TOTAL KNEE ARTHROPLASTY Right 1982, 1984  . TRANSMETATARSAL AMPUTATION Left 09/06/2020   Procedure: AMPUTATION LEFT GREAT TOE;  Surgeon: Waynetta Sandy, MD;  Location: [place];  Service: Vascular;  Laterality: Left;  . WOUND DEBRIDEMENT Right 01/10/2020   Procedure: incisional DEBRIDEMENT of RIGHT TRANSMETATARSAL WOUND;  Surgeon: [name], MD;  Location[facility];  Service: Vascular;  Laterality: Right;    Family History  Problem Relation Age of Onset  . Diabetes Mother   . Hypertension Mother   . Hyperlipidemia Mother   . Kidney disease Mother   . Thyroid disease Mother   . Breast cancer Mother        mets  . Lung cancer Mother   . Diabetes Father   . Alcohol abuse Sister   . Sickle cell trait Sister   . Diabetes Brother   . Asthma Brother   . Lung cancer Maternal Grandmother   . Kidney disease Maternal Grandmother   . Liver cancer Maternal Uncle        x 4-5  . Kidney disease  Maternal Uncle   . Kidney disease Paternal Uncle   . Colon cancer Neg Hx   . Colon polyps Neg Hx   . Esophageal cancer Neg Hx   . Rectal cancer Neg Hx   . Stomach cancer Neg Hx     SOCIAL HISTORY: Social History   Socioeconomic History  . Marital status: Married    Spouse name: [name]  . Number of children: 1  . Years of education: Not on file  . Highest education level: Not on file  Occupational History  . Occupation: unemployed  Tobacco Use  . Smoking status: Current Every Day Smoker    Packs/day: 1.00    Years: 38.00    Pack years: 38.00    Types: Cigarettes    Start date: 09/14/1982  . Smokeless tobacco: Never Used  . Tobacco comment: tobacco info given to patient  Vaping Use  . Vaping Use: Never used  Substance and  Sexual Activity  . Alcohol use: Not Currently    Comment: 2-3 per day, none since 2019  . Drug use: Not Currently  . Sexual activity: Not on file  Other Topics Concern  . Not on file  Social History Narrative   One child, 4 step-kids   Social Determinants of Health   Financial Resource Strain: Not on file  Food Insecurity: Not on file  Transportation Needs: Not on file  Physical Activity: Not on file  Stress: Not on file  Social Connections: Not on file  Intimate Partner Violence: Not on file    Allergies  Allergen Reactions  . Eggs Or Egg-Derived Products Rash  . Morphine And Related Other (See Comments)    Cant take because of pancreatitis  . Cocoa Rash    Current Facility-Administered Medications  Medication Dose Route Frequency Provider Last Rate Last Admin  . acetaminophen (TYLENOL) tablet 1,000 mg  1,000 mg Oral Q6H Lattie Haw, MD   1,000 mg at 11/30/20 1553  . albuterol (PROVENTIL) (2.5 MG/3ML) 0.083% nebulizer solution 2.5 mg  2.5 mg Inhalation Q4H PRN Lilland, Alana, DO      . ARIPiprazole (ABILIFY) tablet 5 mg  5 mg Oral QPM Lilland, Alana, DO   5 mg at 11/30/20 1725  . DULoxetine (CYMBALTA) DR capsule 60 mg  60 mg Oral Daily Lilland, Alana, DO   60 mg at 11/30/20 7510  . gabapentin (NEURONTIN) capsule 600 mg  600 mg Oral TID Lilland, Alana, DO   600 mg at 11/30/20 1553  . insulin aspart (novoLOG) injection 0-9 Units  0-9 Units Subcutaneous TID WC Lilland, Alana, DO   1 Units at 11/30/20 1554  . ketorolac (TORADOL) 30 MG/ML injection 30 mg  30 mg Intravenous Q6H PRN Gladys Damme, MD      . lidocaine (LIDODERM) 5 % 1 patch  1 patch Transdermal Q24H Gladys Damme, MD   1 patch at 11/30/20 1552  . mirtazapine (REMERON) tablet 30 mg  30 mg Oral QHS Lilland, Alana, DO   30 mg at 11/29/20 2024  . mometasone-formoterol (DULERA) 200-5 MCG/ACT inhaler 2 puff  2 puff Inhalation BID Lilland, Alana, DO      . montelukast (SINGULAIR) tablet 10 mg  10 mg Oral q AM  Lilland, Alana, DO   10 mg at 11/30/20 0805  . oxyCODONE (Oxy IR/ROXICODONE) immediate release tablet 5 mg  5 mg Oral Q4H PRN Shary Key, DO   5 mg at 11/30/20 1553  . piperacillin-tazobactam (ZOSYN) IVPB 3.375 g  3.375 g Intravenous Q8H  Benetta Spar D, RPH 12.5 mL/hr at 11/30/20 1725 3.375 g at 11/30/20 1725  . propranolol ER (INDERAL LA) 24 hr capsule 80 mg  80 mg Oral Daily Lilland, Alana, DO   80 mg at 11/30/20 7628  . [START ON 12/01/2020] vancomycin (VANCOREADY) IVPB 1250 mg/250 mL  1,250 mg Intravenous Q12H Benetta Spar D, RPH        ROS:   General:  No weight loss, Fever, chills  HEENT: No recent headaches, no nasal bleeding, no visual changes, no sore throat  Neurologic: No dizziness, blackouts, seizures. No recent symptoms of stroke or mini- stroke. No recent episodes of slurred speech, or temporary blindness.  Cardiac: No recent episodes of chest pain/pressure, no shortness of breath at rest.  No shortness of breath with exertion.  Denies history of atrial fibrillation or irregular heartbeat  Vascular: No history of rest pain in feet.  No history of claudication.  + history of non-healing ulcer, No history of DVT   Pulmonary: No home oxygen, no productive cough, no hemoptysis,  No asthma or wheezing  Musculoskeletal:  [ ]  Arthritis, [ ]  Low back pain,  [ ]  Joint pain  Hematologic:No history of hypercoagulable state.  No history of easy bleeding.  No history of anemia  Gastrointestinal: No hematochezia or melena,  No gastroesophageal reflux, no trouble swallowing  Urinary: [ ]  chronic Kidney disease, [ ]  on HD - [ ]  MWF or [ ]  TTHS, [ ]  Burning with urination, [ ]  Frequent urination, [ ]  Difficulty urinating;   Skin: No rashes  Psychological: No history of anxiety,  No history of depression   Physical Examination  Vitals:   11/29/20 1647 11/29/20 2013 11/30/20 0315 11/30/20 0810  BP: 111/83 124/87 (!) 140/95 136/87  Pulse: 77 70 73 83  Resp: 16 17 17 18   Temp:  98.1 F (36.7 C) 98 F (36.7 C) 97.8 F (36.6 C) 98.4 F (36.9 C)  TempSrc: Oral Oral Oral Oral  SpO2: 97% 96% 99% 98%    There is no height or weight on file to calculate BMI.  General:  Alert and oriented, no acute distress HEENT: Normal Neck: No JVD Cardiac: Regular Rate and Rhythm Skin: No rash, macerated ulcer with edema tip left 2nd toe Extremity Pulses:  2+ radial, brachial, femoral, dorsalis pedis, posterior tibial pulses bilaterally Musculoskeletal: No deformity or edema  Neurologic: Upper and lower extremity motor 5/5 and symmetric  DATA:  CBC    Component Value Date/Time   WBC 10.7 (H) 11/30/2020 0600   RBC 3.85 (L) 11/30/2020 0600   HGB 11.1 (L) 11/30/2020 0600   HGB 13.9 11/08/2020 1009   HCT 32.9 (L) 11/30/2020 0600   HCT 42.3 11/08/2020 1009   PLT 474 (H) 11/30/2020 0600   PLT 417 11/08/2020 1009   MCV 85.5 11/30/2020 0600   MCV 88 11/08/2020 1009   MCH 28.8 11/30/2020 0600   MCHC 33.7 11/30/2020 0600   RDW 13.7 11/30/2020 0600   RDW 12.7 11/08/2020 1009   LYMPHSABS 1.6 11/08/2020 1009   MONOABS 0.7 09/04/2020 1242   EOSABS 0.1 11/08/2020 1009   BASOSABS 0.0 11/08/2020 1009    BMET    Component Value Date/Time   NA 135 11/30/2020 0600   NA 135 11/13/2020 1033   K 4.0 11/30/2020 0600   CL 101 11/30/2020 0600   CO2 25 11/30/2020 0600   GLUCOSE 136 (H) 11/30/2020 0600   BUN 10 11/30/2020 0600   BUN 14 11/13/2020 1033  CREATININE 0.99 11/30/2020 0600   CALCIUM 8.5 (L) 11/30/2020 0600   GFRNONAA >60 11/30/2020 0600   GFRAA 112 11/08/2020 1009     ASSESSMENT:  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

## 2020-11-30 NOTE — Evaluation (Signed)
Occupational Therapy Evaluation Patient Details Name: Christopher Fernandez MRN: [id number] DOB: [birth year] Today's Date: 11/30/2020    History of Present Illness Patient is a 50 y/o male who presented directly from [facility] clinic with back pain on 3/18 with concern for osteomyelitis T9-T10. MRI + for T9-T10 osteomyelitis. PMH: chronic pancreatitis, R transmet amputation, alcohol use dependence, anxiety, GERD, diabetic neuropathy, BPH, HTN, DM   Clinical Impression   Patient admitted for the diagnosis above.  PTA he had just started using a cane for mobility, but was able to still complete his own ADL.  Back pain is the primary barrier.  Currently he is needing up to [place] for lower body ADL and supervision for mobility.  OT educated the patient on the use of a hip kit and log rolling.  Also discussed limiting bending, lifting and twisting.  Patient able to verbalize understanding.  OT can follow in the acute setting, he did well, and could probably be discharged after an additional session for overall compliance.  He is not interested in any therapies at home.      Follow Up Recommendations  No OT follow up    Equipment Recommendations  Other (comment) (hip kit) Shower chair versus 3n1   Recommendations for Other Services       Precautions / Restrictions Precautions Precautions: Fall Precaution Comments: Reviewed hip kit and log roll for back precautions Restrictions Weight Bearing Restrictions: No      Mobility Bed Mobility Overal bed mobility: Modified Independent                  Transfers Overall transfer level: Needs assistance Equipment used: None Transfers: Sit to/from Stand Sit to Stand: Supervision         General transfer comment: supervision for safety    Balance Overall balance assessment: Mild deficits observed, not formally tested                                         ADL either performed or assessed with clinical judgement   ADL                                          General ADL Comments: patient is unable to bring R leg to figure four, and is not comfortable reaching past his knees in a seated position.  Can figure four with L leg.     Vision Patient Visual Report: No change from baseline       Perception     Praxis      Pertinent Vitals/Pain Pain Assessment: 0-10 Pain Score: 7  Pain Location: mid back and R groin Pain Descriptors / Indicators: Aching;Discomfort;Grimacing Pain Intervention(s): Monitored during session     Hand Dominance Right   Extremity/Trunk Assessment Upper Extremity Assessment Upper Extremity Assessment: Overall WFL for tasks assessed   Lower Extremity Assessment Lower Extremity Assessment: Defer to PT evaluation   Cervical / Trunk Assessment Cervical / Trunk Assessment: Normal   Communication Communication Communication: No difficulties   Cognition Arousal/Alertness: Awake/alert Behavior During Therapy: WFL for tasks assessed/performed Overall Cognitive Status: Within Functional Limits for tasks assessed  Home Living Family/patient expects to be discharged to:: Private residence Living Arrangements: Spouse/significant other Available Help at Discharge: Family;Available PRN/intermittently Type of Home: House Home Access: Stairs to enter [employer] of Steps: 1 Entrance Stairs-Rails: None Home Layout: One level     Bathroom Shower/Tub: Teacher, early years/pre: Standard     Home Equipment: Cane - quad          Prior Functioning/Environment Level of Independence: Independent with assistive device(s)        Comments: has been using quad cane for the past week, able to continue to complete ADL from sit/stand level        OT Problem List: Impaired balance (sitting and/or standing);Pain      OT Treatment/Interventions: Self-care/ADL  training;Therapeutic activities;Patient/family education    OT Goals(Current goals can be found in the care plan section) Acute Rehab OT Goals Patient Stated Goal: to reduce pain ADL Goals Pt Will Perform Grooming: with modified independence;standing Pt Will Perform Lower Body Bathing: with modified independence;sit to/from stand Pt Will Perform Lower Body Dressing: with modified independence;sit to/from stand Pt Will Transfer to Toilet: with modified independence;ambulating;regular height toilet  OT Frequency: Min 2X/week   Barriers to D/C:    none noted       Co-evaluation              AM-PAC OT "6 Clicks" Daily Activity     Outcome Measure Help from another person eating meals?: None Help from another person taking care of personal grooming?: None Help from another person toileting, which includes using toliet, bedpan, or urinal?: A Little Help from another person bathing (including washing, rinsing, drying)?: A Little Help from another person to put on and taking off regular upper body clothing?: None Help from another person to put on and taking off regular lower body clothing?: A Little 6 Click Score: 21   End of Session Equipment Utilized During Treatment: Other (comment) (quad cane) Nurse Communication: Mobility status  Activity Tolerance: Patient limited by pain Patient left: in bed;with call bell/phone within reach  OT Visit Diagnosis: Unsteadiness on feet (R26.81);Pain Pain - Right/Left: Right Pain - part of body: Leg                Time: 0932-3557 OT Time Calculation (min): 17 min Charges:  OT General Charges $OT Visit: 1 Visit OT Evaluation $OT Eval Moderate Complexity: 1 Mod  11/30/2020  Rich, OTR/L  Acute Rehabilitation Services  Office:  [phone]   Metta Clines 11/30/2020, 4:44 PM

## 2020-11-30 NOTE — H&P (View-Only) (Signed)
Referring Physician: Family practice service  Patient name: Christopher Fernandez MRN: [id number] DOB: 1971/04/12 Sex: male  REASON FOR CONSULT: osteomyelitis left 2nd toe  HPI: Christopher Fernandez is a 50 y.o. male, several day history of worsening wound left 2nd toe with drainage.  Has previously had amputation of left first toe by Dr Donzetta Matters and right transmet by Dr Carlis Abbott.  He is also admitted with osteo of cspine. Other medical problems include COPD, diabetes, hypertension all of which are stable.  He also smokes about 1 PPD.  Past Medical History:  Diagnosis Date  . Alcoholism ([place])   . Anemia   . Anxiety   . Arthritis   . Asthma   . Blood transfusion without reported diagnosis   . COPD (chronic obstructive pulmonary disease) ([place])   . Depression   . Diabetes ([place])    type 2  . GERD (gastroesophageal reflux disease)   . History of hiatal hernia   . HTN (hypertension)   . Neuromuscular disorder ([facility])    tremors  . Osteomyelitis ([place])    right forefoot  . Pancreatitis   . Pneumonia   . Substance abuse ([place])   . 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 Wears glasses    Past Surgical History:  Procedure Laterality Date  . 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 AMPUTATION Right 10/18/2019   Procedure: RIGHT SECOND TOE AMPUTATION;  Surgeon: Newt Minion, MD;  Location: [facility];  Service: Orthopedics;  Laterality: Right;  . AMPUTATION Right 11/08/2019   Procedure: RIGHT TRANSMETATARSAL AMPUTATION;  Surgeon: Newt Minion, MD;  Location: [facility];  Service: Orthopedics;  Laterality: Right;  . APPLICATION OF WOUND VAC Right 01/10/2020   Procedure: APPLICATION OF WOUND VAC, right foot;  Surgeon: [name], MD;  Location: [facility];  Service: Vascular;  Laterality: Right;  . CHOLECYSTECTOMY  2006   with gallbladder and spleen  . COLONOSCOPY  8-10 years ago    in [place] exam per pt  . HERNIA REPAIR  2008, 2010   hiatal hernia and 1 additional  . NASAL SEPTUM  SURGERY    . PANCREATIC PSEUDOCYST DRAINAGE    . SPLENECTOMY  2006  . STUMP REVISION Right 11/24/2019   Procedure: REVISION RIGHT TRANSMETATARSAL AMPUTATION;  Surgeon: Newt Minion, MD;  Location: [facility];  Service: Orthopedics;  Laterality: Right;  . TOTAL KNEE ARTHROPLASTY Right 1982, 1984  . TRANSMETATARSAL AMPUTATION Left 09/06/2020   Procedure: AMPUTATION LEFT GREAT TOE;  Surgeon: Waynetta Sandy, MD;  Location: [facility];  Service: Vascular;  Laterality: Left;  . WOUND DEBRIDEMENT Right 01/10/2020   Procedure: incisional DEBRIDEMENT of RIGHT TRANSMETATARSAL WOUND;  Surgeon: [name], MD;  Location: [facility] OR;  Service: Vascular;  Laterality: Right;    Family History  Problem Relation Age of Onset  . Diabetes Mother   . Hypertension Mother   . Hyperlipidemia Mother   . Kidney disease Mother   . Thyroid disease Mother   . Breast cancer Mother        mets  . Lung cancer Mother   . Diabetes Father   . Alcohol abuse Sister   . Sickle cell trait Sister   . Diabetes Brother   . Asthma Brother   . Lung cancer Maternal Grandmother   . Kidney disease Maternal Grandmother   . Liver cancer Maternal Uncle        x 4-5  . Kidney disease  Maternal Uncle   . Kidney disease Paternal Uncle   . Colon cancer Neg Hx   . Colon polyps Neg Hx   . Esophageal cancer Neg Hx   . Rectal cancer Neg Hx   . Stomach cancer Neg Hx     SOCIAL HISTORY: Social History   Socioeconomic History  . Marital status: Married    Spouse name: Christopher Fernandez  . Number of children: 1  . Years of education: Not on file  . Highest education level: Not on file  Occupational History  . Occupation: unemployed  Tobacco Use  . Smoking status: Current Every Day Smoker    Packs/day: 1.00    Years: 38.00    Pack years: 38.00    Types: Cigarettes    Start date: 09/14/1982  . Smokeless tobacco: Never Used  . Tobacco comment: tobacco info given to patient  Vaping Use  . Vaping Use: Never used  Substance and  Sexual Activity  . Alcohol use: Not Currently    Comment: 2-3 per day, none since 2019  . Drug use: Not Currently  . Sexual activity: Not on file  Other Topics Concern  . Not on file  Social History Narrative   One child, 4 step-kids   Social Determinants of Health   Financial Resource Strain: Not on file  Food Insecurity: Not on file  Transportation Needs: Not on file  Physical Activity: Not on file  Stress: Not on file  Social Connections: Not on file  Intimate Partner Violence: Not on file    Allergies  Allergen Reactions  . Eggs Or Egg-Derived Products Rash  . Morphine And Related Other (See Comments)    Cant take because of pancreatitis  . Cocoa Rash    Current Facility-Administered Medications  Medication Dose Route Frequency Provider Last Rate Last Admin  . acetaminophen (TYLENOL) tablet 1,000 mg  1,000 mg Oral Q6H Lattie Haw, MD   1,000 mg at 11/30/20 1553  . albuterol (PROVENTIL) (2.5 MG/3ML) 0.083% nebulizer solution 2.5 mg  2.5 mg Inhalation Q4H PRN Lilland, Alana, DO      . ARIPiprazole (ABILIFY) tablet 5 mg  5 mg Oral QPM Lilland, Alana, DO   5 mg at 11/30/20 1725  . DULoxetine (CYMBALTA) DR capsule 60 mg  60 mg Oral Daily Lilland, Alana, DO   60 mg at 11/30/20 9604  . gabapentin (NEURONTIN) capsule 600 mg  600 mg Oral TID Lilland, Alana, DO   600 mg at 11/30/20 1553  . insulin aspart (novoLOG) injection 0-9 Units  0-9 Units Subcutaneous TID WC Lilland, Alana, DO   1 Units at 11/30/20 1554  . ketorolac (TORADOL) 30 MG/ML injection 30 mg  30 mg Intravenous Q6H PRN Gladys Damme, MD      . lidocaine (LIDODERM) 5 % 1 patch  1 patch Transdermal Q24H Gladys Damme, MD   1 patch at 11/30/20 1552  . mirtazapine (REMERON) tablet 30 mg  30 mg Oral QHS Lilland, Alana, DO   30 mg at 11/29/20 2024  . mometasone-formoterol (DULERA) 200-5 MCG/ACT inhaler 2 puff  2 puff Inhalation BID Lilland, Alana, DO      . montelukast (SINGULAIR) tablet 10 mg  10 mg Oral q AM  Lilland, Alana, DO   10 mg at 11/30/20 0805  . oxyCODONE (Oxy IR/ROXICODONE) immediate release tablet 5 mg  5 mg Oral Q4H PRN Shary Key, DO   5 mg at 11/30/20 1553  . piperacillin-tazobactam (ZOSYN) IVPB 3.375 g  3.375 g Intravenous Q8H  Benetta Spar D, RPH 12.5 mL/hr at 11/30/20 1725 3.375 g at 11/30/20 1725  . propranolol ER (INDERAL LA) 24 hr capsule 80 mg  80 mg Oral Daily Lilland, Alana, DO   80 mg at 11/30/20 3664  . [START ON 12/01/2020] vancomycin (VANCOREADY) IVPB 1250 mg/250 mL  1,250 mg Intravenous Q12H Benetta Spar D, RPH        ROS:   General:  No weight loss, Fever, chills  HEENT: No recent headaches, no nasal bleeding, no visual changes, no sore throat  Neurologic: No dizziness, blackouts, seizures. No recent symptoms of stroke or mini- stroke. No recent episodes of slurred speech, or temporary blindness.  Cardiac: No recent episodes of chest pain/pressure, no shortness of breath at rest.  No shortness of breath with exertion.  Denies history of atrial fibrillation or irregular heartbeat  Vascular: No history of rest pain in feet.  No history of claudication.  + history of non-healing ulcer, No history of DVT   Pulmonary: No home oxygen, no productive cough, no hemoptysis,  No asthma or wheezing  Musculoskeletal:  [ ]  Arthritis, [ ]  Low back pain,  [ ]  Joint pain  Hematologic:No history of hypercoagulable state.  No history of easy bleeding.  No history of anemia  Gastrointestinal: No hematochezia or melena,  No gastroesophageal reflux, no trouble swallowing  Urinary: [ ]  chronic Kidney disease, [ ]  on HD - [ ]  MWF or [ ]  TTHS, [ ]  Burning with urination, [ ]  Frequent urination, [ ]  Difficulty urinating;   Skin: No rashes  Psychological: No history of anxiety,  No history of depression   Physical Examination  Vitals:   11/29/20 1647 11/29/20 2013 11/30/20 0315 11/30/20 0810  BP: 111/83 124/87 (!) 140/95 136/87  Pulse: 77 70 73 83  Resp: 16 17 17 18   Temp:  98.1 F (36.7 C) 98 F (36.7 C) 97.8 F (36.6 C) 98.4 F (36.9 C)  TempSrc: Oral Oral Oral Oral  SpO2: 97% 96% 99% 98%    There is no height or weight on file to calculate BMI.  General:  Alert and oriented, no acute distress HEENT: Normal Neck: No JVD Cardiac: Regular Rate and Rhythm Skin: No rash, macerated ulcer with edema tip left 2nd toe Extremity Pulses:  2+ radial, brachial, femoral, dorsalis pedis, posterior tibial pulses bilaterally Musculoskeletal: No deformity or edema  Neurologic: Upper and lower extremity motor 5/5 and symmetric  DATA:  CBC    Component Value Date/Time   WBC 10.7 (H) 11/30/2020 0600   RBC 3.85 (L) 11/30/2020 0600   HGB 11.1 (L) 11/30/2020 0600   HGB 13.9 11/08/2020 1009   HCT 32.9 (L) 11/30/2020 0600   HCT 42.3 11/08/2020 1009   PLT 474 (H) 11/30/2020 0600   PLT 417 11/08/2020 1009   MCV 85.5 11/30/2020 0600   MCV 88 11/08/2020 1009   MCH 28.8 11/30/2020 0600   MCHC 33.7 11/30/2020 0600   RDW 13.7 11/30/2020 0600   RDW 12.7 11/08/2020 1009   LYMPHSABS 1.6 11/08/2020 1009   MONOABS 0.7 09/04/2020 1242   EOSABS 0.1 11/08/2020 1009   BASOSABS 0.0 11/08/2020 1009    BMET    Component Value Date/Time   NA 135 11/30/2020 0600   NA 135 11/13/2020 1033   K 4.0 11/30/2020 0600   CL 101 11/30/2020 0600   CO2 25 11/30/2020 0600   GLUCOSE 136 (H) 11/30/2020 0600   BUN 10 11/30/2020 0600   BUN 14 11/13/2020 1033  CREATININE 0.99 11/30/2020 0600   CALCIUM 8.5 (L) 11/30/2020 0600   GFRNONAA >60 11/30/2020 0600   GFRAA 112 11/08/2020 1009     ASSESSMENT:  Early gangrene with osteomyelitis left 2nd toe also with cspine infection   PLAN: left 2nd toe amputation Dr Stanford Breed Monday     Ruta Hinds, MD [name] Office: [phone]

## 2020-11-30 NOTE — Progress Notes (Signed)
Family Medicine Teaching Service Daily Progress Note Intern Pager: [phone]  Patient name: Christopher Fernandez Medical record number: [id number] Date of birth: [birth year] Age: 50 y.o. Gender: male  Primary Care Provider: Danna Hefty, DO Consultants: Neurosurgery, IR Code Status: Full  Pt Overview and Major Events to Date:  3/18: admitted for c/f thoracic discitis vs osteomyelitis 3/19: MR confirmed T9-T10 osteomyelitis, also left foot 2nd digit osteomyelitis with abscess  Assessment and Plan: Christopher Fernandez is a 50 y.o. male presenting with back pain. PMH is significant for cellulitis, diabetes, neuropathy, anemia, hyperlipidemia, asthma, alcohol abuse, tobacco use disorder, trauma, cholecystectomy, asplenia, anxiety and depression.  Osteomyelitis of T9-T10 Patient with mid-thoracic back pain found to have osteomyelitis of T9-T10, marrow edema also involving the posterior right 10th rib, no abscess elucidated on MR. Discussed with neurosurgery who recommended consulting IR to obtain tissue biopsy, no indication for neurosurgical intervention at this time. IR will set up for procedure on Monday, 3/21. Discussed with pharmacy: patient has h/o MRSA culture on 12/21 and serratia marcesens in 11/2019; will start vancomycin and zosyn per pharmacy. VSS, afebrile. Lab work notable for mild leukocytosis to 10.7. Blood and urine cultures pending, so far NG <24h. - Consult IR, appreciate recommendations - Neurosurgery following, appreciate recommendations - Will consult ID, f/u recommendations - Vanc, zosyn per pharmacy - F/u blood and urine cx - AM CBC, BMP - Pain control: toradol 15 mg IV q6h prn, oxycodone 5mg  IR PO q6h prn, tylenol 1000 mg q6h prn, gabapentin 600 mg TID, lidocaine patch - PT/OT - SCDs for DVT ppx - Up with assistance - Carb modified diet  Left foot, second digit osteomyelitis MR left foot with findings c/w osteomyelitis throughout the distal phalanx of the second toe, also  underlying subcentimeter abscess at tip of second toe. Patient follows with Christopher Fernandez, [facility], who amputated his great toe on left foot in December 2021. Patient prefers to consult [facility] for amputation. See verterbral osteomyelitis note above, will start vanc/zosyn per pharmacy. -Wound care consult placed -Consult [facility] -AM CBC  T2DM with neuropathy: Chronic, stable SSI and CBG qAC, qHS. SO far BG stable 114-136. Will continue to follow. -CBG before meals and nightly -Sensitive sliding scale insulin -Holding home medications while n.p.o. (Jardiance, linagliptin, Metformin, gabapentin) we will restart medications if appropriate with diet  -Consider Ozempic outpatient for better glycemic control  Esophageal candidiasis  EGD completed on 3/9 found signs of concerns for esophageal candidiasis and was started on fluconazole on 3/16 and to be continued for 21 days. - Holding diflucan 100mg  daily until confirmation and so that it will does not interfere with biopsy results - Hold atorvastatin when diflucan resumed  -Monitor sx  Pancreatic insufficiency  chronic pancreatitis Due to pancreatic insufficiency, patient is currently on enzyme supplementation.  Patient has epigastric/RUQ pain which is chronic.  [name] Hx of Anemia: stable   Recent Hb 13.9 in February, hgb stable yesterday and today at 58 - Monitor with CBC  Hyperlipidemia: Chronic, stable Most recent lipid panel 12/12/2019 unremarkable.  Home medications: atorvastatin 40 mg daily. -Continue home atorvastatin daily  Hx asthma: Chronic, stable Home medications: include albuterol as needed,Singulair 10 mg daily  - Dulera, per formulary - Albuterol PRN  Hx alcohol abuse  Tobacco use disorder Has been abstinent from alcohol for the last 14 months. Currently smoking 1/2 pack/day,previous history of 1 to 2 packs/day for 10 to 12 years. About a 35-pack-year smoker. -Encourage smoking cessation  Hx Tremor: chronic, stable Home  meds:  propranolol ER80 mg endorses tremor on right hand when he writes.  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 Not on prophylactic antibiotics. At risk for infections from encapsulated bacteria. Has had both COVID-19 vaccines, pneumococcal and Flu vaccine. -Consider Meningococal vaccine as outpatient  Anxiety with Depression: Chronic, stable Home meds: aripiprazole 5 mg daily, Cymbalta 60 mg, mirtazapine 30 mg. - Continue home medications   Weight loss Recent 12 lb weight loss and night sweats. Currently working up for osteomyelitis. Will continue to monitor, may consult dietician. -Continue to monitor  FEN/GI: carb modified PPx: SCDs  Status is: Inpatient  Remains inpatient appropriate because:Ongoing active pain requiring inpatient pain management, Ongoing diagnostic testing needed not appropriate for outpatient work up and IV treatments appropriate due to intensity of illness or inability to take PO   Dispo: The patient is from: Home              Anticipated d/c is to: Home              Patient currently is not medically stable to d/c.   Difficult to place patient No  Subjective:  Patient is in pain today, mostly in mid-thoracic spine at site of osteomyelitis.  Objective: Temp:  [97.8 F (36.6 C)-98.4 F (36.9 C)] 98.4 F (36.9 C) (03/19 0810) Pulse Rate:  [70-90] 83 (03/19 0810) Resp:  [16-18] 18 (03/19 0810) BP: (110-140)/(62-95) 136/87 (03/19 0810) SpO2:  [94 %-99 %] 98 % (03/19 0810) Weight:  [112.9 kg] 112.9 kg (03/18 1510) Physical Exam: General: AAM, resting comfortably in bed, NAD, central adiposity Cardiovascular: RRR, no m/r/g, 2+ radial pulses Respiratory: CTAB, no iWOB, no w/r/r Abdomen: soft, NT, ND, normoactive bowel sounds Extremities: ulceration to second digit left foot, no surrounding  erythema; limbs are warm, dry, no edema  Laboratory: Recent Labs  Lab 11/29/20 1814 11/30/20 0600  WBC 8.5 10.7*  HGB 11.3* 11.1*  HCT 34.5* 32.9*  PLT 482* 474*   Recent Labs  Lab 11/29/20 1814 11/30/20 0600  NA 131* 135  K 3.8 4.0  CL 101 101  CO2 21* 25  BUN 13 10  CREATININE 1.13 0.99  CALCIUM 8.4* 8.5*  PROT 6.5  --   BILITOT 0.7  --   ALKPHOS 90  --   ALT 15  --   AST 18  --   GLUCOSE 114* 136*   Imaging/Diagnostic Tests: MR THORACIC SPINE W WO CONTRAST  Result Date: 11/30/2020 CLINICAL DATA:  50 year old male with history of chronic pancreatitis, abnormal T9 and T10 vertebra on recent abdominal MRI suspicious for discitis osteomyelitis. Lower extremity wound. Blood cultures are pending. EXAM: MRI THORACIC WITHOUT AND WITH CONTRAST TECHNIQUE: Multiplanar and multiecho pulse sequences of the thoracic spine were obtained without and with intravenous contrast. CONTRAST:  32mL GADAVIST GADOBUTROL 1 MMOL/ML IV SOLN COMPARISON:  Abdomen MRI 11/28/2020. FINDINGS: Limited cervical spine imaging: Evidence of disc and endplate degeneration at C5-C6 and C6-C7. Thoracic spine segmentation:  Appears to be normal. Alignment:  Preserved thoracic kyphosis.  No spondylolisthesis. Vertebrae: Confluent marrow edema throughout the T9 and T10 vertebral bodies with associated endplate erosions (more pronounced at the T10 superior endplate. Abnormal heterogeneous intervertebral disc signal with some intervertebral disc enhancement. Associated paraspinal and epidural space inflammation eccentric to the right, and involving the bilateral T9 and T10 neural foramina greater on the right. Ventral more  so than dorsal dural thickening and enhancement (series 65, image 8) which tracks up to the T8 and down to the T12 levels. Marrow edema and enhancement also in the posterior right 10th rib, and right T10 posterior elements including the midline spinous process. No epidural or paraspinal abscess at this time.  Cord: Normal despite the T9-T10 abnormality. Capacious underlying spinal canal. No abnormal intradural enhancement. Conus medullaris only partially visible at T12-L1. Paraspinal and other soft tissues: Trace bilateral layering pleural fluid but otherwise negative visible thoracic viscera. Negative thoracic paraspinal soft tissues outside of the T9 and T10 level. Disc levels: Normal intervertebral disc spaces T1-T2 through T8-T9. T9-T10 detailed above. T10-T11 through T12-L1 discs remain normal. There is mild lower thoracic facet hypertrophy. But no spinal or foraminal stenosis. No age advanced thoracic spine degeneration. IMPRESSION: 1. Positive for T9-T10 Discitis Osteomyelitis. Marrow edema also involving the posterior right 10th rib, T10 posterior elements. Epidural and paraspinal soft tissue inflammation eccentric to the right. Associated dural thickening and enhancement. But no abscess at this time. 2. No thoracic spinal stenosis. No age advanced degenerative changes in the thoracic spine. Electronically Signed   By: Genevie Ann M.D.   On: 11/30/2020 11:27   MR FOOT LEFT W WO CONTRAST  Result Date: 11/30/2020 CLINICAL DATA:  Skin wound on the left second toe with pain and swelling in a diabetic patient. EXAM: MRI OF THE LEFT FOREFOOT WITHOUT AND WITH CONTRAST TECHNIQUE: Multiplanar, multisequence MR imaging of the left forefoot was performed both before and after administration of intravenous contrast. CONTRAST:  10 mL GADAVIST IV SOLN COMPARISON:  Plain films left foot and MRI left foot 09/04/2020. FINDINGS: Bones/Joint/Cartilage The patient is status post amputation of the great toe since the prior study. There is edema and enhancement in the head and neck of the first metatarsal and in both sesamoid bones of the first MTP joint. Also seen is intense edema and enhancement in the distal phalanx of the second toe. Milder edema and enhancement are seen in the proximal and middle phalanges of the second toe and  in the plantar surface of the head of the second metatarsal. A cyst in the central and distal medial cuneiform and small cyst and patchy edema in the middle cuneiform are unchanged since the prior MRI. These may be degenerative. Ligaments Appear intact. Muscles and Tendons There is some atrophy of intrinsic musculature the foot. Refer tract and flexor hallucis longus tendon related to great toe amputation is incidentally noted. Soft tissues No abscess is identified. There is subcutaneous edema and enhancement at the stump of the great toe and about the second toe. A skin wound is seen at the tip of the great toe with an underlying abscess measuring 0.7 cm in diameter. IMPRESSION: Findings consistent with osteomyelitis throughout the distal phalanx of the second toe. Milder degree of edema and enhancement in the proximal and middle phalanges of the second toe and plantar surface of the head of the second metatarsal could be reactive but are worrisome for osteomyelitis. Skin wound at the tip of the second toe with a subcentimeter underlying abscess. Status post amputation of the great toe. Edema and enhancement in the head and neck of the first metatarsal and medial and lateral sesamoids are most worrisome for osteomyelitis given overlying intense subcutaneous edema and enhancement. Cellulitis about the patient's stump and second toe. Electronically Signed   By: Inge Rise M.D.   On: 11/30/2020 11:25    Gladys Damme, MD 11/30/2020, 10:16 AM  PGY-2, [place] Intern pager: [phone], text pages welcome

## 2020-11-30 NOTE — Progress Notes (Signed)
Pharmacy Antibiotic Note  Christopher Fernandez is a 50 y.o. male admitted on 11/29/2020 with T9-T10 osteomyelitis and L foot 2nd digit osteomyelitis with abscess. Patient with history of MRSA, Serratia and B fragilis in wound and blood. Pharmacy has been consulted for vancomycin and pipercillin/tazobactam dosing.  Planned IR tissue biopsy 3/21. Excellent renal function Cl 120 ml/min.   3/19 Vancomycin 1250mg  Q 12 hr Scr used: 1 mg/dL Weight: 113 kg Vd coeff: 0.5 L/kg Est AUC: 485  Plan: Vancomycin 1500mg  x1 then 1250mg  Q12hr  Pip/tazp 3.375 q8hr EI Monitor cultures, clinical status, renal fx, vanc levels  Narrow abx as able and f/u duration   Temp (24hrs), Avg:98.1 F (36.7 C), Min:97.8 F (36.6 C), Max:98.4 F (36.9 C)  Recent Labs  Lab 11/29/20 1814 11/30/20 0600  WBC 8.5 10.7*  CREATININE 1.13 0.99    Estimated Creatinine Clearance: 119.7 mL/min (by C-G formula based on SCr of 0.99 mg/dL).    Allergies  Allergen Reactions  . Eggs Or Egg-Derived Products Rash  . Morphine And Related Other (See Comments)    Cant take because of pancreatitis  . Cocoa Rash    Antimicrobials this admission: Vanc 3/19 >> Piptazo 3/19>>    Microbiology results: 3/18 BCx: NGTD 3/18 UCx: ngtd   12/23 MRSA PCR: positive   Thank you for allowing pharmacy to be a part of this patient's care.  Benetta Spar, PharmD, BCPS, BCCP Clinical Pharmacist  Please check AMION for all [place] phone numbers After 10:00 PM, call [place] [phone]

## 2020-11-30 NOTE — Progress Notes (Signed)
Interim progress note  RN paged about continued pain despite tylenol, ibuprofen, and the recent addition of oxycodone. Assessed patient at bedside and he endorsed worsening right sided mid back pain and right sided groin pain, 8/10.. States it is worse now than on admission.   Vitals stable  Physical exam General: sitting up in bed appearing uncomfortable, NAD CV: RRR no murmurs Resp: some coughing, normal WOB MSK: R sided thoracic area at level of T9/10 tender to palpation  Adding Toradol 15mg  and removed ibuprofen. We can reassess pain regimen in the morning. The rest of the plan remains the same from the H&P

## 2020-11-30 NOTE — Evaluation (Signed)
Physical Therapy Evaluation Patient Details Name: Christopher Fernandez MRN: [id number] DOB: [birth year] Today's Date: 11/30/2020   History of Present Illness  Patient is a 50 y/o male who presented directly from [facility] clinic with back pain on 3/18 with concern for osteomyelitis T9-T10. MRI + for T9-T10 osteomyelitis. PMH: chronic pancreatitis, R transmet amputation, alcohol use dependence, anxiety, GERD, diabetic neuropathy, BPH, HTN, DM  Clinical Impression  PTA, patient lives with wife and reports independence, however has been using quad cane recently for ambulation. Patient presents with generalized weakness, impaired balance, and decreased activity tolerance. Patient overall supervision for mobility, however mild balance deficits noted. Patient will benefit from skilled PT services during acute stay to address listed deficits and improve balance. No PT follow up recommended at this time.     Follow Up Recommendations No PT follow up    Equipment Recommendations  None recommended by PT    Recommendations for Other Services       Precautions / Restrictions Precautions Precautions: Fall Restrictions Weight Bearing Restrictions: No      Mobility  Bed Mobility Overal bed mobility: Modified Independent                  Transfers Overall transfer level: Needs assistance Equipment used: None Transfers: Sit to/from Stand Sit to Stand: Supervision         General transfer comment: supervision for safety  Ambulation/Gait Ambulation/Gait assistance: Supervision Gait Distance (Feet): 200 Feet Assistive device: Quad cane Gait Pattern/deviations: Step-through pattern;Decreased stride length;Wide base of support     General Gait Details: guarded throughout ambulation. no LOB noted, however mild unsteadiness initially  Stairs Stairs: Yes Stairs assistance: Min guard Stair Management: With cane;Step to pattern;Forwards Number of Stairs: 2    Wheelchair Mobility     Modified Rankin (Stroke Patients Only)       Balance Overall balance assessment: Mild deficits observed, not formally tested                                           Pertinent Vitals/Pain Pain Assessment: 0-10 Pain Score: 7  Pain Location: mid back and R groin Pain Descriptors / Indicators: Aching;Discomfort;Grimacing Pain Intervention(s): Monitored during session;Repositioned    Home Living Family/patient expects to be discharged to:: Private residence Living Arrangements: Spouse/significant other Available Help at Discharge: Family;Available PRN/intermittently Type of Home: House Home Access: Stairs to enter Entrance Stairs-Rails: None Entrance Stairs-Number of Steps: 1 Home Layout: One level Home Equipment: Cane - quad      Prior Function Level of Independence: Independent         Comments: has been using quad cane for the past week     Hand Dominance        Extremity/Trunk Assessment   Upper Extremity Assessment Upper Extremity Assessment: Defer to OT evaluation    Lower Extremity Assessment Lower Extremity Assessment: Overall WFL for tasks assessed (patient reports R leg feels weaker than L)    Cervical / Trunk Assessment Cervical / Trunk Assessment: Normal  Communication   Communication: No difficulties  Cognition Arousal/Alertness: Awake/alert Behavior During Therapy: WFL for tasks assessed/performed Overall Cognitive Status: Within Functional Limits for tasks assessed  General Comments      Exercises     Assessment/Plan    PT Assessment Patient needs continued PT services  PT Problem List Decreased strength;Decreased activity tolerance;Decreased balance       PT Treatment Interventions DME instruction;Gait training;Stair training;Functional mobility training;Therapeutic activities;Therapeutic exercise;Balance training;Patient/family education    PT Goals  (Current goals can be found in the Care Plan section)  Acute Rehab PT Goals Patient Stated Goal: to reduce pain PT Goal Formulation: With patient Time For Goal Achievement: 12/14/20 Potential to Achieve Goals: Good    Frequency Min 5X/week   Barriers to discharge        Co-evaluation               AM-PAC PT "6 Clicks" Mobility  Outcome Measure Help needed turning from your back to your side while in a flat bed without using bedrails?: None Help needed moving from lying on your back to sitting on the side of a flat bed without using bedrails?: None Help needed moving to and from a bed to a chair (including a wheelchair)?: A Little Help needed standing up from a chair using your arms (e.g., wheelchair or bedside chair)?: A Little Help needed to walk in hospital room?: A Little Help needed climbing 3-5 steps with a railing? : A Little 6 Click Score: 20    End of Session Equipment Utilized During Treatment: Gait belt Activity Tolerance: Patient tolerated treatment well Patient left: in chair;with call bell/phone within reach Nurse Communication: Mobility status PT Visit Diagnosis: Unsteadiness on feet (R26.81);Muscle weakness (generalized) (M62.81)    Time: 6060-0459 PT Time Calculation (min) (ACUTE ONLY): 16 min   Charges:   PT Evaluation $PT Eval Low Complexity: 1 Low          Teancum Brule A. Gilford Rile PT, DPT Acute Rehabilitation Services Pager [phone] Office [phone]   Linna Hoff 11/30/2020, 1:21 PM

## 2020-11-30 NOTE — Progress Notes (Signed)
2/4 staph aureus mrsa aerobic  PHARMACY - PHYSICIAN COMMUNICATION CRITICAL VALUE ALERT - BLOOD CULTURE IDENTIFICATION (BCID)  Christopher Fernandez is an 50 y.o. male who presented to [facility] on 11/29/2020 with a chief complaint of T9-T10 osteomyelitis and L foot 2nd digit osteomyelitis with abscess.  Assessment: 2/4 aerobic blood cultures growing MRSA. Patient is already  on vancomycin.   Name of physician (or Provider) Contacted: Gifford Shave   Current antibiotics: Vancomycin, piperacillin/tazobactam   Changes to prescribed antibiotics recommended:  Patient is on recommended antibiotics - No changes needed  No results found for this or any previous visit.  Benetta Spar, PharmD, BCPS, BCCP Clinical Pharmacist  Please check AMION for all [place] phone numbers After 10:00 PM, call [place] [phone]

## 2020-12-01 ENCOUNTER — Inpatient Hospital Stay (HOSPITAL_COMMUNITY): Payer: 59

## 2020-12-01 ENCOUNTER — Ambulatory Visit (HOSPITAL_COMMUNITY): Payer: 59

## 2020-12-01 DIAGNOSIS — E1169 Type 2 diabetes mellitus with other specified complication: Principal | ICD-10-CM

## 2020-12-01 DIAGNOSIS — R1031 Right lower quadrant pain: Secondary | ICD-10-CM

## 2020-12-01 DIAGNOSIS — M86672 Other chronic osteomyelitis, left ankle and foot: Secondary | ICD-10-CM

## 2020-12-01 DIAGNOSIS — L02612 Cutaneous abscess of left foot: Secondary | ICD-10-CM

## 2020-12-01 DIAGNOSIS — R7881 Bacteremia: Secondary | ICD-10-CM | POA: Diagnosis not present

## 2020-12-01 DIAGNOSIS — M8619 Other acute osteomyelitis, multiple sites: Secondary | ICD-10-CM | POA: Diagnosis not present

## 2020-12-01 DIAGNOSIS — B9562 Methicillin resistant Staphylococcus aureus infection as the cause of diseases classified elsewhere: Secondary | ICD-10-CM | POA: Diagnosis present

## 2020-12-01 DIAGNOSIS — Z794 Long term (current) use of insulin: Secondary | ICD-10-CM

## 2020-12-01 DIAGNOSIS — M25551 Pain in right hip: Secondary | ICD-10-CM | POA: Diagnosis present

## 2020-12-01 LAB — BASIC METABOLIC PANEL
Anion gap: 10 (ref 5–15)
BUN: 12 mg/dL (ref 6–20)
CO2: 23 mmol/L (ref 22–32)
Calcium: 8.4 mg/dL — ABNORMAL LOW (ref 8.9–10.3)
Chloride: 98 mmol/L (ref 98–111)
Creatinine, Ser: 0.93 mg/dL (ref 0.61–1.24)
GFR, Estimated: >60 mL/min (ref >60)
Glucose, Bld: 153 mg/dL — ABNORMAL HIGH (ref 70–99)
Potassium: 3.7 mmol/L (ref 3.5–5.1)
Sodium: 131 mmol/L — ABNORMAL LOW (ref 135–145)

## 2020-12-01 LAB — ECHOCARDIOGRAM COMPLETE
AR max vel: 3.99 cm2
AV Area VTI: 4.36 cm2
AV Area mean vel: 4.31 cm2
AV Mean grad: 3 mmHg
AV Peak grad: 5.7 mmHg
Ao pk vel: 1.19 m/s
Area-P 1/2: 3.65 cm2
S' Lateral: 3.1 cm
Weight: 3984 oz

## 2020-12-01 LAB — CBC
HCT: 31.4 % — ABNORMAL LOW (ref 39.0–52.0)
Hemoglobin: 10.7 g/dL — ABNORMAL LOW (ref 13.0–17.0)
MCH: 28.6 pg (ref 26.0–34.0)
MCHC: 34.1 g/dL (ref 30.0–36.0)
MCV: 84 fL (ref 80.0–100.0)
Platelets: 478 10*3/uL — ABNORMAL HIGH (ref 150–400)
RBC: 3.74 MIL/uL — ABNORMAL LOW (ref 4.22–5.81)
RDW: 13.4 % (ref 11.5–15.5)
WBC: 10.6 10*3/uL — ABNORMAL HIGH (ref 4.0–10.5)
nRBC: 0 % (ref 0.0–0.2)

## 2020-12-01 LAB — URINE CULTURE: Culture: NO GROWTH

## 2020-12-01 MED ORDER — METRONIDAZOLE 500 MG PO TABS
500.0000 mg | ORAL_TABLET | Freq: Two times a day (BID) | ORAL | Status: DC
  Administered 2020-12-01 (×2): 500 mg via ORAL
  Filled 2020-12-01 (×2): qty 1

## 2020-12-01 MED ORDER — SODIUM CHLORIDE 0.9 % IV SOLN
2.0000 g | Freq: Three times a day (TID) | INTRAVENOUS | Status: DC
  Administered 2020-12-01 – 2020-12-02 (×3): 2 g via INTRAVENOUS
  Filled 2020-12-01 (×3): qty 2

## 2020-12-01 MED ORDER — OXYCODONE HCL 5 MG PO TABS
10.0000 mg | ORAL_TABLET | ORAL | Status: DC | PRN
  Administered 2020-12-01 – 2020-12-10 (×31): 10 mg via ORAL
  Filled 2020-12-01 (×32): qty 2

## 2020-12-01 MED ORDER — PANCRELIPASE (LIP-PROT-AMYL) 36000-114000 UNITS PO CPEP
36000.0000 [IU] | ORAL_CAPSULE | Freq: Three times a day (TID) | ORAL | Status: DC
  Administered 2020-12-01 – 2020-12-10 (×24): 36000 [IU] via ORAL
  Filled 2020-12-01 (×32): qty 1

## 2020-12-01 MED ORDER — PANTOPRAZOLE SODIUM 40 MG PO TBEC
40.0000 mg | DELAYED_RELEASE_TABLET | Freq: Every day | ORAL | Status: DC
  Administered 2020-12-01 – 2020-12-10 (×10): 40 mg via ORAL
  Filled 2020-12-01 (×10): qty 1

## 2020-12-01 MED ORDER — FLUCONAZOLE 200 MG PO TABS
200.0000 mg | ORAL_TABLET | Freq: Every day | ORAL | Status: DC
  Administered 2020-12-01 – 2020-12-10 (×9): 200 mg via ORAL
  Filled 2020-12-01 (×11): qty 1

## 2020-12-01 NOTE — Progress Notes (Signed)
   Patient with second toe osteomyelitis, DM ulcer with drainage. ABI Findings:  +--------+------------------+-----+---------+--------+  Right  Rt Pressure (mmHg)IndexWaveform Comment   +--------+------------------+-----+---------+--------+  Brachial120           triphasic      +--------+------------------+-----+---------+--------+  PTA   167        1.39 triphasic      +--------+------------------+-----+---------+--------+  DP   168        1.40 triphasic      +--------+------------------+-----+---------+--------+   +---------+------------------+-----+---------+-----------------------------  ----+  Left   Lt Pressure (mmHg)IndexWaveform Comment                +---------+------------------+-----+---------+-----------------------------  ----+  Brachial 120           triphasic                   +---------+------------------+-----+---------+-----------------------------  ----+  PTA   164        1.37 triphasic                   +---------+------------------+-----+---------+-----------------------------  ----+  DP    161        1.34 triphasic                   +---------+------------------+-----+---------+-----------------------------  ----+  Great Toe            Normal  Unable to get a pressure due  to                         toe circumference           +---------+------------------+-----+---------+-----------------------------  ----+    TOES Findings:    Right transmet amputation   +---------+---------------+--------+-------+  Left ToesPressure (mmHg)WaveformComment  +---------+---------------+--------+-------+  1st Digit        Normal       +---------+---------------+--------+-------+        Arterial wall  calcification precludes accurate ankle pressures and ABIs.    Summary:  Right: Resting right ankle-brachial index indicates noncompressible right  lower extremity arteries.   Left: Resting left ankle-brachial index indicates noncompressible left  lower extremity arteries.    PLAN: left 2nd toe amputation Dr Stanford Breed Monday  NPO and consent place  Roxy Horseman PA-C

## 2020-12-01 NOTE — Consult Note (Addendum)
[facility] Nurse Consult Note: Reason for Consult: Consult performed remotely after review of progress notes in the EMR. Requested for left toe wound prior to vascular team involvement.  3/19 progress notes indicate: "Early gangrene with osteomyelitis left 2nd toe. PLAN: left 2nd toe amputation Dr Stanford Breed Monday. No further role for WOC nursing; please refer to vascular team for further plan of care.  Please re-consult if further assistance is needed.  Thank-you,  Julien Girt MSN, [place], [place], [place], [place]

## 2020-12-01 NOTE — Progress Notes (Incomplete)
  Echocardiogram 2D Echocardiogram has been performed.  Christopher Fernandez 12/01/2020, 4:17 PM

## 2020-12-01 NOTE — Consult Note (Addendum)
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 Would also need to r/o endocarditis as well  The source could be from the toe it appear at this time. No ivdu   Plan: 1. Continue vanc; change piptazo to cefepime and metronidazole 2. Await vascular surg amputation left 2nd toe 3. tte  4. Can defer tee at this time, unless persistent bacteremia. tx duration for osteomyelitis would cover those for endocarditis if it is present 5. For the vertebral OM, no need to get biopsy of the vertebra as he has mssa bacteremia, and that is the presumed cause (if other bacteremia not staph aureus, will need to get biopsy) 6. Would also get mri right hip 7. Repeat blood culture 8. Ok to resume fluconazole for esophageal candidiasis; can do 1 more week 200 mg daily   Active Problems:   Osteomyelitis (HCC)   Scheduled Meds: . acetaminophen  1,000 mg Oral Q6H  . ARIPiprazole  5 mg Oral QPM  . DULoxetine  60 mg Oral Daily  . gabapentin  600 mg Oral TID  . insulin aspart  0-9 Units Subcutaneous TID WC  . lidocaine   1 patch Transdermal Q24H  . lipase/protease/amylase  36,000 Units Oral TID WC  . mirtazapine  30 mg Oral QHS  . mometasone-formoterol  2 puff Inhalation BID  . montelukast  10 mg Oral q AM  . pantoprazole  40 mg Oral Daily  . propranolol ER  80 mg Oral Daily   Continuous Infusions: . piperacillin-tazobactam (ZOSYN)  IV 3.375 g (12/01/20 0921)  . vancomycin 1,250 mg (12/01/20 0456)   PRN Meds:.albuterol, ketorolac, oxyCODONE  HPI: CHRISTOPHOR EICK is a 50 y.o. male tobacco use, copd, gerd, dm2, anxiety/depression, alcoholism with chronic pancreatitis, [facility] for back pain found to have sepsis, mrsa bacteremia, along with chronic left diabetic foot infection  Sx include: 6 weeks of lower abdominal pain; during this time also developed mid back pain Associated sx of chills/sweat, 12 pounds wt loss 2 weeks pta, and fatigue no urinary or bowel incontinence/LE weakness Also complains 1 week right groin pain diabetic foot ulcer on the left second toe that is been present for 1 week. Denies pain there  Chronic copd cough Hx alcoholism and chronic pancreatitis. Denies recent use etoh (last use 14 months pta) Still smokes 1/2 ppd >35 years No ivdu  Had mrcp on 3/17 for pancreatitis which showed t9-10 changes, so during clinic f/u 3/18 admitted for further w/u  Of note, previously on 3/09 had egd for epigastric pain, and clinical finding of candida esophagitis was found. Started  on fluconazole but currently hold this admission pending w/u of the toe infection    Course: Afebrile on admission Wbc 10-11 Mri t spine t9-10 discitis/vertebral om Mri abd as below Mri left foot with 2nd toe om and associated soft tissue abscess bcx on admission 3/18 returned with mrsa; abx started vanc piptazo 3/19 Vascular surgery consulted and plan 2nd toe amputation tomorrow   Overall feels the same since admission   Review of Systems: ROS Other ros negative  Past  Medical History:  Diagnosis Date  . Alcoholism ([place])   . Anemia   . Anxiety   . Arthritis   . Asthma   . Blood transfusion without reported diagnosis   . COPD (chronic obstructive pulmonary disease) ([place])   . Depression   . Diabetes ([place])    type 2  . GERD (gastroesophageal reflux disease)   . History of hiatal hernia   . HTN (hypertension)   . Neuromuscular disorder (HCC)    tremors  . Osteomyelitis ([place])    right forefoot  . Pancreatitis   . Pneumonia   . Substance abuse ([place])   . 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 Wears glasses     Social History   Tobacco Use  . Smoking status: Current Every Day Smoker    Packs/day: 1.00    Years: 38.00    Pack years: 38.00    Types: Cigarettes    Start date: 09/14/1982  . Smokeless tobacco: Never Used  . Tobacco comment: tobacco info given to patient  Vaping Use  . Vaping Use: Never used  Substance Use Topics  . Alcohol use: Not Currently    Comment: 2-3 per day, none since 2019  . Drug use: Not Currently    Family History  Problem Relation Age of Onset  . Diabetes Mother   . Hypertension Mother   . Hyperlipidemia Mother   . Kidney disease Mother   . Thyroid disease Mother   . Breast cancer Mother        mets  . Lung cancer Mother   . Diabetes Father   . Alcohol abuse Sister   . Sickle cell trait Sister   . Diabetes Brother   . Asthma Brother   . Lung cancer Maternal Grandmother   . Kidney disease Maternal Grandmother   . Liver cancer Maternal Uncle        x 4-5  . Kidney disease Maternal Uncle   . Kidney disease Paternal Uncle   . Colon cancer Neg Hx   . Colon polyps Neg Hx   . Esophageal cancer Neg Hx   . Rectal cancer Neg Hx   . Stomach cancer Neg Hx    Allergies  Allergen Reactions  . Eggs Or Egg-Derived Products Rash  . Morphine And Related Other (See Comments)    Cant take because of pancreatitis  . Cocoa Rash    OBJECTIVE: Blood pressure 126/76, pulse 82, temperature 99 F (37.2 C),  temperature source Oral, resp. rate 19, SpO2 96 %.  Physical Exam Constitutional:      General: He is not in acute distress.    Appearance: Normal appearance. He is not toxic-appearing.  HENT:     Head: Normocephalic.     Mouth/Throat:     Mouth: Mucous membranes are moist.     Pharynx: Oropharynx is clear.  Eyes:     Conjunctiva/sclera: Conjunctivae normal.     Pupils: Pupils are equal, round, and reactive to light.  Cardiovascular:  Rate and Rhythm: Normal rate.     Pulses: Normal pulses.     Heart sounds: Normal heart sounds.  Pulmonary:     Effort: Pulmonary effort is normal.     Breath sounds: Normal breath sounds.  Abdominal:     Palpations: Abdomen is soft.     Tenderness: There is no abdominal tenderness.  Musculoskeletal:        General: Normal range of motion.     Cervical back: Normal range of motion.     Comments: No back tenderness   Mild right groin tenderness on log-roll test  Right foot s/p all toes resection. There is a small callous the ulcerated 2nd toe area with dry blood; nontender and no purulence  Left foot in dressing c/d; no surrounding swelling/erythema outside of 2nd toe ulceration  Skin:    General: Skin is warm.  Neurological:     General: No focal deficit present.     Mental Status: He is alert and oriented to person, place, and time.     Cranial Nerves: No cranial nerve deficit.  Psychiatric:        Mood and Affect: Mood normal.        Behavior: Behavior normal.     Lab Results Lab Results  Component Value Date   WBC 10.6 (H) 12/01/2020   HGB 10.7 (L) 12/01/2020   HCT 31.4 (L) 12/01/2020   MCV 84.0 12/01/2020   PLT 478 (H) 12/01/2020    Lab Results  Component Value Date   CREATININE 0.93 12/01/2020   BUN 12 12/01/2020   NA 131 (L) 12/01/2020   K 3.7 12/01/2020   CL 98 12/01/2020   CO2 23 12/01/2020    Lab Results  Component Value Date   ALT 15 11/29/2020   AST 18 11/29/2020   ALKPHOS 90 11/29/2020   BILITOT 0.7  11/29/2020     Microbiology: Recent Results (from the past 240 hour(s))  Culture, blood (routine x 2)     Status: Abnormal (Preliminary result)   Collection Time: 11/29/20  7:37 PM   Specimen: BLOOD LEFT ARM  Result Value Ref Range Status   Specimen Description BLOOD LEFT ARM  Final   Special Requests   Final    BOTTLES DRAWN AEROBIC ONLY Blood Culture adequate volume   Culture  Setup Time   Final    GRAM POSITIVE COCCI IN CLUSTERS AEROBIC BOTTLE ONLY CRITICAL RESULT CALLED TO, READ BACK BY AND VERIFIED WITH: L,CHEN PHARMD @1953  11/30/20 EB    Culture (A)  Final    STAPHYLOCOCCUS AUREUS SUSCEPTIBILITIES TO FOLLOW Performed at [facility], [address] [place], [place] [address]    Report Status PENDING  Incomplete  Culture, blood (routine x 2)     Status: Abnormal (Preliminary result)   Collection Time: 11/29/20  7:37 PM   Specimen: BLOOD LEFT ARM  Result Value Ref Range Status   Specimen Description BLOOD LEFT ARM  Final   Special Requests   Final    BOTTLES DRAWN AEROBIC ONLY Blood Culture adequate volume   Culture  Setup Time   Final    GRAM POSITIVE COCCI IN CLUSTERS AEROBIC BOTTLE ONLY CRITICAL VALUE NOTED.  VALUE IS CONSISTENT WITH PREVIOUSLY REPORTED AND CALLED VALUE. Performed at [facility], [place] [address], [place], [place] [address]    Culture STAPHYLOCOCCUS AUREUS (A)  Final   Report Status PENDING  Incomplete  Blood Culture ID Panel (Reflexed)     Status: Abnormal  Collection Time: 11/29/20  7:37 PM  Result Value Ref Range Status   Enterococcus faecalis NOT DETECTED NOT DETECTED Final   Enterococcus Faecium NOT DETECTED NOT DETECTED Final   Listeria monocytogenes NOT DETECTED NOT DETECTED Final   Staphylococcus species DETECTED (A) NOT DETECTED Final    Comment: CRITICAL RESULT CALLED TO, READ BACK BY AND VERIFIED WITH: L,CHEN PHARMD @1953  11/30/20 EB    Staphylococcus aureus (BCID) DETECTED (A) NOT DETECTED Final    Comment: Methicillin  (oxacillin)-resistant Staphylococcus aureus (MRSA). MRSA is predictably resistant to beta-lactam antibiotics (except ceftaroline). Preferred therapy is vancomycin unless clinically contraindicated. Patient requires contact precautions if  hospitalized. CRITICAL RESULT CALLED TO, READ BACK BY AND VERIFIED WITH: L,CHEN PHARMD @1953  11/30/20 EB    Staphylococcus epidermidis NOT DETECTED NOT DETECTED Final   Staphylococcus lugdunensis NOT DETECTED NOT DETECTED Final   Streptococcus species NOT DETECTED NOT DETECTED Final   Streptococcus agalactiae NOT DETECTED NOT DETECTED Final   Streptococcus pneumoniae NOT DETECTED NOT DETECTED Final   Streptococcus pyogenes NOT DETECTED NOT DETECTED Final   A.calcoaceticus-baumannii NOT DETECTED NOT DETECTED Final   Bacteroides fragilis NOT DETECTED NOT DETECTED Final   Enterobacterales NOT DETECTED NOT DETECTED Final   Enterobacter cloacae complex NOT DETECTED NOT DETECTED Final   Escherichia coli NOT DETECTED NOT DETECTED Final   Klebsiella aerogenes NOT DETECTED NOT DETECTED Final   Klebsiella oxytoca NOT DETECTED NOT DETECTED Final   Klebsiella pneumoniae NOT DETECTED NOT DETECTED Final   Proteus species NOT DETECTED NOT DETECTED Final   Salmonella species NOT DETECTED NOT DETECTED Final   Serratia marcescens NOT DETECTED NOT DETECTED Final   Haemophilus influenzae NOT DETECTED NOT DETECTED Final   Neisseria meningitidis NOT DETECTED NOT DETECTED Final   Pseudomonas aeruginosa NOT DETECTED NOT DETECTED Final   Stenotrophomonas maltophilia NOT DETECTED NOT DETECTED Final   Candida albicans NOT DETECTED NOT DETECTED Final   Candida auris NOT DETECTED NOT DETECTED Final   Candida glabrata NOT DETECTED NOT DETECTED Final   Candida krusei NOT DETECTED NOT DETECTED Final   Candida parapsilosis NOT DETECTED NOT DETECTED Final   Candida tropicalis NOT DETECTED NOT DETECTED Final   Cryptococcus neoformans/gattii NOT DETECTED NOT DETECTED Final   Meth  resistant mecA/C and MREJ DETECTED (A) NOT DETECTED Final    Comment: CRITICAL RESULT CALLED TO, READ BACK BY AND VERIFIED WITH: L,CHEN PHARMD @1953  11/30/20 EB Performed at [facility], [address], [place], [place] [address]   Culture, Urine     Status: None   Collection Time: 11/29/20  9:25 PM   Specimen: Urine, Random  Result Value Ref Range Status   Specimen Description URINE, RANDOM  Final   Special Requests NONE  Final   Culture   Final    NO GROWTH Performed at [facility], [place] [address], [place], [place] [address]    Report Status 12/01/2020 FINAL  Final     Serology:   Imaging: If present, new imagings (plain films, ct scans, and mri) have been personally visualized and interpreted; radiology reports have been reviewed. Decision making incorporated into the Impression / Recommendations.  3/19 mri thoracic spine 1. Positive for T9-T10 Discitis Osteomyelitis. Marrow edema also involving the posterior right 10th rib, T10 posterior elements. Epidural and paraspinal soft tissue inflammation eccentric to the right. Associated dural thickening and enhancement. But no abscess at this time.  2. No thoracic spinal stenosis. No age advanced degenerative changes in the thoracic spine.  3/19 mri left  foot Findings consistent with osteomyelitis throughout the distal phalanx of the second toe. Milder degree of edema and enhancement in the proximal and middle phalanges of the second toe and plantar surface of the head of the second metatarsal could be reactive but are worrisome for osteomyelitis.  Skin wound at the tip of the second toe with a subcentimeter underlying abscess.  Status post amputation of the great toe. Edema and enhancement in the head and neck of the first metatarsal and medial and lateral sesamoids are most worrisome for osteomyelitis given overlying intense subcutaneous edema and enhancement.  Cellulitis about the patient's stump and  second toe  3/17 mri abdomen 1. Changes due to chronic pancreatitis. 2. There are 2 small hypointense filling defects within the mid and distal CBD which measure up to 3 mm. Although a corresponding finding is not confidently noted on other T2 weighted sequences, choledocholithiasis cannot be excluded. 3. Mild intrahepatic and extrahepatic bile duct dilatation. Areas of concentric narrowing of the common bile duct at the level of the head of pancreas are identified which are likely secondary to chronic inflammation due to pancreatitis 4. Acute changes involving the T9 and T10 vertebra are concerning for discitis/osteomyelitis. Further investigation with contrast enhanced MRI of the thoracic spine is advised.  Jabier Mutton, [facility] [place] [phone] pager    12/01/2020, 9:44 AM

## 2020-12-01 NOTE — Progress Notes (Signed)
Family Medicine Teaching Service Daily Progress Note Intern Pager: [phone]  Patient name: Christopher Fernandez Medical record number: [id number] Date of birth: [birth year] Age: 50 y.o. Gender: male  Primary Care Provider: Danna Hefty, DO Consultants: ID, Vascular surgery Code Status: Full  Pt Overview and Major Events to Date:  3/18: admitted for c/f thoracic discitis vs osteomyelitis 3/19: MR confirmed T9-T10 osteomyelitis, also left foot 2nd digit osteomyelitis with abscess   Assessment and Plan: Christopher Fernandez is a 50 y.o. male presenting with back pain. PMH is significant for cellulitis, diabetes, neuropathy, anemia, hyperlipidemia, asthma, alcohol abuse, tobacco use disorder, trauma, cholecystectomy, asplenia, anxiety and depression.   MRSA bacteremia Osteomyelitis of T9-T10 MRSA bacteremia, no need for biopsy at this time given this is the presumptive infection being treated. Will continue with ID recommendations of IV vanc. S/p cefepime and flagyl x2 days. - ID following, appreciate recommendations - Continue Vanc - F/u blood and urine culture  - Pain control: toradol 30 mg IV q8h PRN, oxycodone 10mg  IR PO Q4PRN, tylenol 1000 mg Q6PRN, gabapentin 600 mg TID, lidocaine patch Q24H -Protonix 40mg  daily for GI protection  -PT/OT -SCDs for DVT ppx -NPO MN   Left foot, second digit osteomyelitis Vascular surgery following, anticipate left 2nd toe amputation 3/21 with Dr. Stanford Breed -Wound care consult placed - VVS following, appreciate recommendations- - Continue Vanc   T2DM with neuropathy: Chronic, stable CBGs 246-294, will plan to add long acting insulin after procedure if CBGs appropriate.  -CBG before meals and nightly -Sensitive sliding scale insulin -Holding home medications while n.p.o. (Jardiance, linagliptin, Metformin, gabapentin)  -Consider Ozempic outpatient for better glycemic control   Esophageal candidiasis  Found by EGD on 3/09. -Cont. fluconazole 200mg  daily  for the next week -Hold atorvastatin  -Monitor sx   Pancreatic insufficiency  chronic pancreatitis -Continue Creon 36, 000 units with meals . Hx of Anemia: stable  Hgb 10.7 - Monitor with CBC    Hyperlipidemia: Chronic, stable -Continue home atorvastatin daily   Hx asthma: Chronic, stable - Dulera, per formulary - Albuterol PRN   Hx alcohol abuse  Tobacco use disorder -Encourage smoking cessation   Hx Tremor: chronic, stable Home meds: propranolol ER80 mg endorses tremor on right hand when he writes.  - Continue propranolol 80 mg daily    Hx Asplenia, Cholecystectomy: chronic, stable -Consider Meningococal vaccine as outpatient   Anxiety with Depression: Chronic, stable Home meds: aripiprazole 5 mg daily, Cymbalta 60 mg, mirtazapine 30 mg. - Continue home medications    Weight loss Recent 12 lb weight loss and night sweats. -Continue to monitor     FEN/GI: carb modified diet, protonix  PPx: SCDs   Status is: Inpatient  Remains inpatient appropriate because:IV treatments appropriate due to intensity of illness or inability to take PO   Dispo: The patient is from: Home              Anticipated d/c is to: Home              Patient currently is not medically stable to d/c.   Difficult to place patient No    Subjective:  Patient has no complaints and states he is doing pretty well today. 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  Laboratory: Recent Labs  Lab 11/30/20 0600 12/01/20 0112 12/02/20 0302  WBC 10.7* 10.6* 9.3  HGB 11.1* 10.7* 10.7*  HCT 32.9* 31.4* 32.9*  PLT 474* 478* 474*   Recent Labs  Lab 11/29/20 1814 11/30/20 0600 12/01/20 0112 12/02/20 0302  NA 131* 135 131* 132*  K 3.8 4.0 3.7 4.3  CL 101 101 98 100  CO2 21* 25 23 25   BUN 13 10 12 15   CREATININE 1.13 0.99 0.93 0.99  CALCIUM 8.4* 8.5* 8.4* 8.6*  PROT 6.5  --   --   --   BILITOT 0.7  --   --   --   ALKPHOS 90  --   --   --   ALT 15  --   --   --   AST 18  --   --   --   GLUCOSE 114* 136* 153* 289*     Imaging/Diagnostic Tests: ECHOCARDIOGRAM COMPLETE  Result Date: 12/01/2020    ECHOCARDIOGRAM REPORT   Patient Name:   Christopher Fernandez Date of Exam: 12/01/2020 Medical Rec #:  [id number]      Height:       75.0 in Accession #:    [id number]     Weight:       249.0 lb Date of Birth:  [birth year]       BSA:          2.409 m Patient Age:    61 years       BP:           115/79 mmHg Patient Gender: M              HR:           83 bpm. Exam Location:  Inpatient Procedure: 2D Echo, Cardiac Doppler and Color Doppler Indications:    Bacteremia  History:        Patient has no prior history of Echocardiogram examinations.                 COPD; Risk Factors:Current Smoker and Diabetes. GERD. ETOH                 abuse.  Sonographer:    Clayton Lefort RDCS (AE) Referring Phys: [id number] [facility] T VU  Sonographer Comments: Technically difficult study due to poor echo windows, suboptimal parasternal window, suboptimal apical window and suboptimal subcostal window. Image acquisition challenging due to COPD and Image acquisition challenging due to respiratory motion. Limited patient mobility. IMPRESSIONS  1. Left ventricular ejection fraction, by estimation, is 60 to 65%. The left ventricle has normal function. The left ventricle has no regional wall motion abnormalities. Left ventricular diastolic parameters were normal.  2. Right ventricular  systolic function is normal. The right ventricular size is normal.  3. The pericardial effusion is anterior to the right ventricle.  4. The mitral valve was not well visualized. No evidence of mitral valve regurgitation.  5. The aortic valve was not well visualized. Aortic valve regurgitation is not visualized. Aortic valve area, by VTI measures 4.36 cm. Aortic valve mean gradient measures 3.0 mmHg. Aortic valve Vmax measures 1.19 m/s.  6. Aortic dilatation noted. There is borderline dilatation  of the aortic root, measuring 37 mm.  7. The inferior vena cava is normal in size with greater than 50% respiratory variability, suggesting right atrial pressure of 3 mmHg.  8. Poor image quality precludes adequate assessment of valves. Cannot rule out vegetation on mitral valve in the apical 2 chamber view. The AV cannot be assessed. Suggest TEE for further evaulation. FINDINGS  Left Ventricle: Left ventricular ejection fraction, by estimation, is 60 to 65%. The left ventricle has normal function. The left ventricle has no regional wall motion abnormalities. The left ventricular internal cavity size was normal in size. There is  no left ventricular hypertrophy. Left ventricular diastolic parameters were normal. Normal left ventricular filling pressure. Right Ventricle: The right ventricular size is normal. No increase in right ventricular wall thickness. Right ventricular systolic function is normal. Left Atrium: Left atrial size was normal in size. Right Atrium: Right atrial size was normal in size. Pericardium: Trivial pericardial effusion is present. The pericardial effusion is anterior to the right ventricle. Mitral Valve: The mitral valve was not well visualized. Mild mitral annular calcification. No evidence of mitral valve regurgitation. Tricuspid Valve: The tricuspid valve is normal in structure. Tricuspid valve regurgitation is trivial. No evidence of tricuspid stenosis. Aortic Valve: The aortic valve was not well  visualized. Aortic valve regurgitation is not visualized. Aortic valve mean gradient measures 3.0 mmHg. Aortic valve peak gradient measures 5.7 mmHg. Aortic valve area, by VTI measures 4.36 cm. Pulmonic Valve: The pulmonic valve was not well visualized. Pulmonic valve regurgitation is not visualized. Aorta: The aortic root is normal in size and structure and aortic dilatation noted. There is borderline dilatation of the aortic root, measuring 37 mm. Venous: The inferior vena cava is normal in size with greater than 50% respiratory variability, suggesting right atrial pressure of 3 mmHg. IAS/Shunts: No atrial level shunt detected by color flow Doppler.  LEFT VENTRICLE PLAX 2D LVIDd:         4.30 cm  Diastology LVIDs:         3.10 cm  LV e' medial:    12.30 cm/s LV PW:         1.60 cm  LV E/e' medial:  5.7 LV IVS:        1.00 cm  LV e' lateral:   9.57 cm/s LVOT diam:     2.40 cm  LV E/e' lateral: 7.4 LV SV:         84 LV SV Index:   35 LVOT Area:     4.52 cm  RIGHT VENTRICLE             IVC RV S prime:     12.30 cm/s  IVC diam: 2.30 cm TAPSE (M-mode): 2.9 cm LEFT ATRIUM           Index       RIGHT ATRIUM           Index LA diam:      4.20 cm 1.74 cm/m  RA Area:     21.00 cm LA Vol (A4C): 43.8 ml 18.19 ml/m RA Volume:   63.80 ml  26.49 ml/m  AORTIC VALVE AV Area (Vmax):    3.99 cm AV Area (Vmean):   4.31 cm AV Area (VTI):     4.36 cm AV Vmax:           119.00 cm/s AV Vmean:          74.800 cm/s AV VTI:  0.192 m AV Peak Grad:      5.7 mmHg AV Mean Grad:      3.0 mmHg LVOT Vmax:         105.00 cm/s LVOT Vmean:        71.300 cm/s LVOT VTI:          0.185 m LVOT/AV VTI ratio: 0.96  AORTA Ao Root diam: 3.70 cm MITRAL VALVE MV Area (PHT): 3.65 cm    SHUNTS MV Decel Time: 208 msec    Systemic VTI:  0.18 m MV E velocity: 70.70 cm/s  Systemic Diam: 2.40 cm MV A velocity: 56.40 cm/s MV E/A ratio:  1.25 Fransico Him MD Electronically signed by Fransico Him MD Signature Date/Time: 12/01/2020/4:45:26 PM    Final       [name], DO 12/02/2020, 12:01 PM PGY-1, [place] Intern pager: [phone], text pages welcome

## 2020-12-01 NOTE — Hospital Course (Addendum)
Christopher Fernandez is a 50 y.o. male presenting with back pain. PMH is significant for cellulitis, diabetes, neuropathy, anemia, hyperlipidemia, asthma, alcohol abuse, tobacco use disorder, trauma, cholecystectomy, asplenia, anxiety and depression.  MRSA bacteremia  Osteomyelitis of T9/T10 and left foot Patient admitted to the hospital due to concern for osteomyelitis of T9/T10, which was seen on MRCP obtained in the outpatient setting. Patient was stable and afebrile upon admission. Blood cultures were collected and patient was found to have MRSA bacteremia. During admission, a wound was discovered on patient's left second toe which was imaged with MRI and found to have osteomyelitis of the second toe. Vascular surgery was consulted with recommendations for left second toe amputation, which was completed on 3/21. Treated with Daptomycin for his bacteremia, with instructions to finish 6-week course (until 5/3). A PICC was placed during admission. Weekly CK levels recommended while on antibiotic, his first level was drawn on 3/28 and decreased at 46.    Right Hip Pain MRI of right hip on 3/21 showed concern for early osteomyelitis of superior pubic ramus. Additionally, showed prominent edema and enhancement of the right adductor minimus muscle adjacent to the superior pubic ramus concerning for myositis. A repeat MRI was obtained on 3/29 to assess for pyomyositis and showed improving myositis but worsening of pelvic ramus osteomyelitis.   T2DM with neuropathy During hospitalization, insulin was initially held as patient was not eating in preparation for procedure and insulin was slowly added back and titrated as appropriate. Required up to 18U Lantus. Discharged back on home diabetes regimen.   Esophageal candidiasis  Patient was found to have esophageal candidiasis on an outpatient EGD on 3/09 and was prescribed fluconazole on 3/16. Medication was initially held in the hospital originally as it was unknown  if the patient was taking the medication. ID was consulted for the osteomyelitis and bacteremia and medication was restarted on 3/20. GI who had prescribed the medication recommended a stop date of 12/18/20.   LFT abnormalities  Bile Duct Dilatation On 3/23 patient reported RUQ pain and CMP obtained which showed marked elevation in alkaline phosphatase, ALT, AST, and t. Bili. Patient is s/p remote cholecystectomy. Patient had recently undergone MRI/MCRP on March 18 which showed 2 small hypointense filling defects, unable to r/o choledocolithiasis. RUQ US obtained on 3/23 demonstrated increased bile duct dilatation to 19 mm. GI was consulted and recommended an ERCP, which occurred on 3/25 and was significant for biliary sludge. Pancreatic stent was placed. His LFT's increased on day #3 post-procedure (AST 22>558 and ALT 53>269). Reassuringly, he his abdominal exam was stable and reassuring. GI consulted and recommended follow up in 1-week outpatient for repeat ERCP to ensure successful migration of pancreatic stent. Hepatitis panel obtained and negative. On d/c his LFT's were improving but still elevated (AST 161, ALT 259, Alk phos 184).   All other chronic conditions were stable and home medications given as appropriate.   Issues for follow-up Consider Ozempic outpatient for better glycemic control. Stop fluconazole on 12/18/20. Weekly CK while on Daptomycin (next check 4/4).  Should continue Daptomycin until 5/18. Follow up with GI for repeat ERCP week of 4/4.  F/u LFT's were elevated but down-trending (see above) on discharge.

## 2020-12-01 NOTE — Progress Notes (Signed)
Pharmacy Antibiotic Note  Christopher Fernandez is a 50 y.o. male admitted on 11/29/2020 with T9-T10 osteomyelitis and L foot 2nd digit osteomyelitis with abscess. Patient with history of MRSA, Serratia and B fragilis in wound and blood. Pharmacy has been consulted for vancomycin and cefepime dosing.  Patient started on Zosyn/Vanc. Now switching to Cefepime/Vanc. Renal function stable since last dose calculations, will continue same vancomycin dose.   Planned IR tissue biopsy 3/21. Excellent renal function Cl 120 ml/min.    Plan: - Continue vancomycin 1250 mg IV every 12 hours (goal AUC 400-550) - Cefepime 2g IV every 8 hours - Monitor cultures, clinical status, renal fx, vanc levels  - Narrow abx as able and f/u duration   Temp (24hrs), Avg:98.3 F (36.8 C), Min:97.8 F (36.6 C), Max:99 F (37.2 C)  Recent Labs  Lab 11/29/20 1814 11/30/20 0600 12/01/20 0112  WBC 8.5 10.7* 10.6*  CREATININE 1.13 0.99 0.93    Estimated Creatinine Clearance: 127.5 mL/min (by C-G formula based on SCr of 0.93 mg/dL).    Allergies  Allergen Reactions  . Eggs Or Egg-Derived Products Rash  . Morphine And Related Other (See Comments)    Cant take because of pancreatitis  . Cocoa Rash    Antimicrobials this admission: 3/19 Vanc >> 3/19 Zosyn >> 3/20 3/20 Cefepime >>   Microbiology results: 3/18 BCID: 2/4 aerobic blood cultures growing MRSA 3/18 UCx: negative, Final 12/23 MRSA PCR: positive       Kateria Cutrona L. Devin Going, PharmD, [place] PGY2 Pharmacy Resident Weekends 7:00 am - 3:00 pm, please call [phone] 12/01/20      10:12 AM  Please check AMION for all [place] phone numbers After 10:00 PM, call the [place] [phone]

## 2020-12-01 NOTE — Progress Notes (Signed)
Family Medicine Teaching Service Daily Progress Note Intern Pager: [phone]  Patient name: Christopher Fernandez Medical record number: [id number] Date of birth: 02/23/1971 Age: 50 y.o. 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 PMH is significant forcellulitis, diabetes, neuropathy, anemia, hyperlipidemia, asthma, alcohol abuse, tobacco use disorder, trauma, cholecystectomy, asplenia, anxietyanddepression.  MRSA bacteremia Osteomyelitis of T9-T10 Pt reports 3-8/10 back pain severity. Toradol is the only medication which is helping him. The oxy helps a little but is not strong enough. Neurosurg: no neurosurgical interventions needed.  IR: Bone biopsy procedure on Monday, 3/21. H/o MRSA culture on 12/21 and serratia marcesens in 11/2019;pharmacy recommended starting vancomycin and zosyn per pharmacy.. Blood cx MRSA - ID following, appreciate recommendations-switch Zosyn to Cefepime and metronidazole, TTE, repeat blood cx, ok to resume fluconazole 200mg  daily for the next week - Continue Vanc, Cefepime and metronidazole - F/u blood and urine culture  - Pain control: toradol 30 mg IV Q6PRN, increase oxycodone to 10mg  IR PO Q4PRN, tylenol 1000 mg Q6PRN, gabapentin 600 mg TID, lidocaine patch Q24H -Protonix 40mg  daily for GI protection  -PT/OT -SCDs for DVT ppx -NPO MN   Left foot, second digit osteomyelitis MR left foot:findings c/w osteomyelitis throughout the distal phalanx of the second toe, also underlying subcentimeter abscess at tip of second toe. Patient follows with Dr. Carlis Abbott, VVS, who amputated his great toe on left foot in December 2021.  -Wound care consult  placed -VVS following, appreciate recommendations-left 2nd toe amputation 3/21 with Dr Stanford Breed -Continue Vanc, Cefipime and Metronidazole   T2DM with neuropathy: Chronic, stable CBGs 136, 153 -CBG before meals and nightly -Sensitive sliding scale insulin -Holding home medications while n.p.o. (Jardiance, linagliptin, Metformin, gabapentin) we will restart medications if appropriate with diet  -Consider Ozempic outpatient for better glycemic control   Esophageal candidiasis  EGD completed on 3/9 found signs of concerns for esophageal candidiasis and was started on fluconazole on 3/16 and to be continued for 21 days. -Per ID-ok to resumed fluconazole 200mg  daily for the next week -Hold atorvastatin  -Monitor sx   Pancreatic insufficiency  chronic pancreatitis Home meds; Creon 36, 000 units with meals -Continue Creon 36, 000 units with meals . Hx of Anemia: stable  Hb 10 today - Monitor with CBC    Hyperlipidemia: Chronic, stable Most recent lipid panel 12/12/2019 unremarkable.  Home medications: atorvastatin 40 mg daily. -Continue home atorvastatin daily   Hx asthma: Chronic, stable Home medications: include albuterol as needed, Singulair 10 mg daily  - Dulera, per formulary - Albuterol PRN   Hx alcohol abuse  Tobacco use disorder -Encourage smoking cessation   Hx Tremor: chronic, stable Home meds: propranolol ER80 mg endorses tremor on right hand when he writes.  - Continue propranolol 80 mg daily    Hx Asplenia, Cholecystectomy: chronic, stable -Consider Meningococal vaccine as outpatient   Anxiety with Depression: Chronic, stable Home meds: aripiprazole 5 mg daily, Cymbalta 60 mg, mirtazapine 30 mg. - Continue home medications    Weight loss Recent 12 lb weight loss and night sweats. -Continue to monitor   FEN/GI: carb modified diet, protonix  PPx: SCDs   Status is: Inpatient  Dispo: The patient is from: Home  Anticipated d/c is to: Home               Patient currently is not medically stable to d/c.   Difficult to place patient No   Subjective:  Pt reports 3-8/10 back pain severity. Toradol is the only medication which is helping him. The oxy helps a little but is not strong enough. Would like a higher dose of oxycodone. Denies thoracic back pain.  Objective: Temp:  [97.8 F (36.6 C)-98.2 F (36.8 C)] 97.8 F (36.6 C) (03/20 0346) Pulse Rate:  [72-77] 72 (03/20 0346) Resp:  [18] 18 (03/20 0346) BP: (128-133)/(85-99) 133/99 (03/20 0346) SpO2:  [90 %-99 %] 99 % (03/20 0346)  Physical Exam: General: Alert, no acute distress Cardio: Normal S1 and S2, RRR, no r/m/g Pulm: CTAB, normal work of breathing Abdomen: Bowel sounds normal. Abdomen soft and non-tender.  Extremities: No peripheral edema.  Neuro: Cranial nerves grossly intact  Laboratory: Recent Labs  Lab 11/29/20 1814 11/30/20 0600 12/01/20 0112  WBC 8.5 10.7* 10.6*  HGB 11.3* 11.1* 10.7*  HCT 34.5* 32.9* 31.4*  PLT 482* 474* 478*   Recent Labs  Lab 11/29/20 1814 11/30/20 0600 12/01/20 0112  NA 131* 135 131*  K 3.8 4.0 3.7  CL 101 101 98  CO2 21* 25 23  BUN 13 10 12   CREATININE 1.13 0.99 0.93  CALCIUM 8.4* 8.5* 8.4*  PROT 6.5  --   --   BILITOT 0.7  --   --   ALKPHOS 90  --   --   ALT 15  --   --   AST 18  --   --   GLUCOSE 114* 136* 153*      Imaging/Diagnostic Tests: MR THORACIC SPINE W WO CONTRAST  Result Date: 11/30/2020 CLINICAL DATA:  49 year old male with history of chronic pancreatitis, abnormal T9 and T10 vertebra on recent abdominal MRI suspicious for discitis osteomyelitis. Lower extremity wound. Blood cultures are pending. EXAM: MRI THORACIC WITHOUT AND WITH CONTRAST TECHNIQUE: Multiplanar and multiecho pulse sequences of the thoracic spine were obtained without and with intravenous contrast. CONTRAST:  59mL GADAVIST GADOBUTROL 1 MMOL/ML IV SOLN COMPARISON:  Abdomen MRI 11/28/2020. FINDINGS: Limited cervical spine imaging: Evidence  of disc and endplate degeneration at C5-C6 and C6-C7. Thoracic spine segmentation:  Appears to be normal. Alignment:  Preserved thoracic kyphosis.  No spondylolisthesis. Vertebrae: Confluent marrow edema throughout the T9 and T10 vertebral bodies with associated endplate erosions (more pronounced at the T10 superior endplate. Abnormal heterogeneous intervertebral disc signal with some intervertebral disc enhancement. Associated paraspinal and epidural space inflammation eccentric to the right, and involving the bilateral T9 and T10 neural foramina greater on the right. Ventral more so than dorsal dural thickening and enhancement (series 65, image 8) which tracks up to the T8 and down to the T12 levels. Marrow edema and enhancement also in the posterior right 10th rib, and right T10 posterior elements including the midline spinous process. No epidural or paraspinal abscess at this time. Cord: Normal despite the T9-T10 abnormality. Capacious underlying spinal canal. No abnormal intradural enhancement. Conus medullaris only partially visible at T12-L1. Paraspinal and other soft tissues: Trace bilateral layering pleural fluid but otherwise negative visible thoracic viscera. Negative thoracic paraspinal soft tissues outside of the T9 and T10 level. Disc levels: Normal intervertebral disc spaces T1-T2 through T8-T9. T9-T10 detailed above. T10-T11 through T12-L1 discs remain normal. There is mild lower thoracic facet hypertrophy. But no spinal or foraminal stenosis. No age advanced thoracic spine degeneration.  IMPRESSION: 1. Positive for T9-T10 Discitis Osteomyelitis. Marrow edema also involving the posterior right 10th rib, T10 posterior elements. Epidural and paraspinal soft tissue inflammation eccentric to the right. Associated dural thickening and enhancement. But no abscess at this time. 2. No thoracic spinal stenosis. No age advanced degenerative changes in the thoracic spine. Electronically Signed   By: Genevie Ann M.D.    On: 11/30/2020 11:27   MR FOOT LEFT W WO CONTRAST  Result Date: 11/30/2020 CLINICAL DATA:  Skin wound on the left second toe with pain and swelling in a diabetic patient. EXAM: MRI OF THE LEFT FOREFOOT WITHOUT AND WITH CONTRAST TECHNIQUE: Multiplanar, multisequence MR imaging of the left forefoot was performed both before and after administration of intravenous contrast. CONTRAST:  10 mL GADAVIST IV SOLN COMPARISON:  Plain films left foot and MRI left foot 09/04/2020. FINDINGS: Bones/Joint/Cartilage The patient is status post amputation of the great toe since the prior study. There is edema and enhancement in the head and neck of the first metatarsal and in both sesamoid bones of the first MTP joint. Also seen is intense edema and enhancement in the distal phalanx of the second toe. Milder edema and enhancement are seen in the proximal and middle phalanges of the second toe and in the plantar surface of the head of the second metatarsal. A cyst in the central and distal medial cuneiform and small cyst and patchy edema in the middle cuneiform are unchanged since the prior MRI. These may be degenerative. Ligaments Appear intact. Muscles and Tendons There is some atrophy of intrinsic musculature the foot. Refer tract and flexor hallucis longus tendon related to great toe amputation is incidentally noted. Soft tissues No abscess is identified. There is subcutaneous edema and enhancement at the stump of the great toe and about the second toe. A skin wound is seen at the tip of the great toe with an underlying abscess measuring 0.7 cm in diameter. IMPRESSION: Findings consistent with osteomyelitis throughout the distal phalanx of the second toe. Milder degree of edema and enhancement in the proximal and middle phalanges of the second toe and plantar surface of the head of the second metatarsal could be reactive but are worrisome for osteomyelitis. Skin wound at the tip of the second toe with a subcentimeter  underlying abscess. Status post amputation of the great toe. Edema and enhancement in the head and neck of the first metatarsal and medial and lateral sesamoids are most worrisome for osteomyelitis given overlying intense subcutaneous edema and enhancement. Cellulitis about the patient's stump and second toe. Electronically Signed   By: Inge Rise M.D.   On: 11/30/2020 11:25   Lattie Haw, MD 12/01/2020, 8:30 AM PGY-2, [place] Intern pager: [phone], text pages welcome

## 2020-12-02 ENCOUNTER — Encounter (HOSPITAL_COMMUNITY): Payer: Self-pay | Admitting: Family Medicine

## 2020-12-02 ENCOUNTER — Inpatient Hospital Stay (HOSPITAL_COMMUNITY): Payer: 59

## 2020-12-02 ENCOUNTER — Inpatient Hospital Stay (HOSPITAL_COMMUNITY): Payer: 59 | Admitting: Anesthesiology

## 2020-12-02 ENCOUNTER — Encounter (HOSPITAL_COMMUNITY)
Admission: AD | Disposition: A | Discharge status: Home Health | Payer: Self-pay | Source: Ambulatory Visit | Attending: Family Medicine

## 2020-12-02 DIAGNOSIS — M8619 Other acute osteomyelitis, multiple sites: Secondary | ICD-10-CM | POA: Diagnosis not present

## 2020-12-02 DIAGNOSIS — B9562 Methicillin resistant Staphylococcus aureus infection as the cause of diseases classified elsewhere: Secondary | ICD-10-CM | POA: Diagnosis not present

## 2020-12-02 DIAGNOSIS — M4644 Discitis, unspecified, thoracic region: Secondary | ICD-10-CM

## 2020-12-02 DIAGNOSIS — R7881 Bacteremia: Secondary | ICD-10-CM | POA: Diagnosis not present

## 2020-12-02 DIAGNOSIS — E11628 Type 2 diabetes mellitus with other skin complications: Secondary | ICD-10-CM | POA: Diagnosis not present

## 2020-12-02 DIAGNOSIS — L089 Local infection of the skin and subcutaneous tissue, unspecified: Secondary | ICD-10-CM | POA: Diagnosis not present

## 2020-12-02 DIAGNOSIS — M25551 Pain in right hip: Secondary | ICD-10-CM | POA: Diagnosis not present

## 2020-12-02 DIAGNOSIS — I96 Gangrene, not elsewhere classified: Secondary | ICD-10-CM

## 2020-12-02 HISTORY — PX: AMPUTATION: SHX166

## 2020-12-02 LAB — GLUCOSE, CAPILLARY
Glucose-Capillary: 89 mg/dL (ref 70–99)
Glucose-Capillary: 111 mg/dL — ABNORMAL HIGH (ref 70–99)
Glucose-Capillary: 113 mg/dL — ABNORMAL HIGH (ref 70–99)
Glucose-Capillary: 138 mg/dL — ABNORMAL HIGH (ref 70–99)
Glucose-Capillary: 133 mg/dL — ABNORMAL HIGH (ref 70–99)
Glucose-Capillary: 238 mg/dL — ABNORMAL HIGH (ref 70–99)
Glucose-Capillary: 145 mg/dL — ABNORMAL HIGH (ref 70–99)
Glucose-Capillary: 294 mg/dL — ABNORMAL HIGH (ref 70–99)
Glucose-Capillary: 246 mg/dL — ABNORMAL HIGH (ref 70–99)
Glucose-Capillary: 263 mg/dL — ABNORMAL HIGH (ref 70–99)
Glucose-Capillary: 276 mg/dL — ABNORMAL HIGH (ref 70–99)
Glucose-Capillary: 245 mg/dL — ABNORMAL HIGH (ref 70–99)
Glucose-Capillary: 157 mg/dL — ABNORMAL HIGH (ref 70–99)
Glucose-Capillary: 133 mg/dL — ABNORMAL HIGH (ref 70–99)
Glucose-Capillary: 161 mg/dL — ABNORMAL HIGH (ref 70–99)

## 2020-12-02 LAB — CBC
HCT: 32.9 % — ABNORMAL LOW (ref 39.0–52.0)
Hemoglobin: 10.7 g/dL — ABNORMAL LOW (ref 13.0–17.0)
MCH: 27.9 pg (ref 26.0–34.0)
MCHC: 32.5 g/dL (ref 30.0–36.0)
MCV: 85.9 fL (ref 80.0–100.0)
Platelets: 474 10*3/uL — ABNORMAL HIGH (ref 150–400)
RBC: 3.83 MIL/uL — ABNORMAL LOW (ref 4.22–5.81)
RDW: 13.6 % (ref 11.5–15.5)
WBC: 9.3 10*3/uL (ref 4.0–10.5)
nRBC: 0 % (ref 0.0–0.2)

## 2020-12-02 LAB — CULTURE, BLOOD (ROUTINE X 2)
Special Requests: ADEQUATE
Special Requests: ADEQUATE

## 2020-12-02 LAB — RESP PANEL BY RT-PCR (FLU A&B, COVID) ARPGX2
Influenza A by PCR: NEGATIVE
Influenza B by PCR: NEGATIVE
SARS Coronavirus 2 by RT PCR: NEGATIVE

## 2020-12-02 LAB — BASIC METABOLIC PANEL
Anion gap: 7 (ref 5–15)
BUN: 15 mg/dL (ref 6–20)
CO2: 25 mmol/L (ref 22–32)
Calcium: 8.6 mg/dL — ABNORMAL LOW (ref 8.9–10.3)
Chloride: 100 mmol/L (ref 98–111)
Creatinine, Ser: 0.99 mg/dL (ref 0.61–1.24)
GFR, Estimated: >60 mL/min (ref >60)
Glucose, Bld: 289 mg/dL — ABNORMAL HIGH (ref 70–99)
Potassium: 4.3 mmol/L (ref 3.5–5.1)
Sodium: 132 mmol/L — ABNORMAL LOW (ref 135–145)

## 2020-12-02 LAB — SURGICAL PCR SCREEN
MRSA, PCR: POSITIVE — AB
Staphylococcus aureus: POSITIVE — AB

## 2020-12-02 LAB — PROTIME-INR
INR: 1 (ref 0.8–1.2)
Prothrombin Time: 12.9 seconds (ref 11.4–15.2)

## 2020-12-02 SURGERY — AMPUTATION DIGIT
Anesthesia: Monitor Anesthesia Care | Site: Foot | Laterality: Left

## 2020-12-02 MED ORDER — OXYCODONE HCL 5 MG PO TABS
ORAL_TABLET | ORAL | Status: AC
  Administered 2020-12-02: 5 mg via ORAL
  Filled 2020-12-02: qty 1

## 2020-12-02 MED ORDER — ACETAMINOPHEN 10 MG/ML IV SOLN: 1000.0000 mg | Freq: Once | INTRAVENOUS | Status: DC | PRN

## 2020-12-02 MED ORDER — GADOBUTROL 1 MMOL/ML IV SOLN
10.0000 mL | Freq: Once | INTRAVENOUS | Status: AC | PRN
  Administered 2020-12-02: 10 mL via INTRAVENOUS

## 2020-12-02 MED ORDER — PROPOFOL 500 MG/50ML IV EMUL
INTRAVENOUS | Status: DC | PRN
  Administered 2020-12-02: 100 ug/kg/min via INTRAVENOUS

## 2020-12-02 MED ORDER — FENTANYL CITRATE (PF) 100 MCG/2ML IJ SOLN: 100.0000 ug | Freq: Once | INTRAMUSCULAR | Status: AC

## 2020-12-02 MED ORDER — CHLORHEXIDINE GLUCONATE 0.12 % MT SOLN
OROMUCOSAL | Status: AC
  Administered 2020-12-02: 15 mL via OROMUCOSAL
  Filled 2020-12-02: qty 15

## 2020-12-02 MED ORDER — ACETAMINOPHEN 160 MG/5ML PO SOLN: 1000.0000 mg | Freq: Once | ORAL | Status: DC | PRN

## 2020-12-02 MED ORDER — OXYCODONE HCL 5 MG/5ML PO SOLN: 5.0000 mg | Freq: Once | ORAL | Status: AC | PRN

## 2020-12-02 MED ORDER — MUPIROCIN 2 % EX OINT
1.0000 "application " | TOPICAL_OINTMENT | Freq: Two times a day (BID) | CUTANEOUS | Status: AC
  Administered 2020-12-02 – 2020-12-06 (×10): 1 via NASAL
  Filled 2020-12-02 (×2): qty 22

## 2020-12-02 MED ORDER — CHLORHEXIDINE GLUCONATE CLOTH 2 % EX PADS
6.0000 | MEDICATED_PAD | Freq: Every day | CUTANEOUS | Status: AC
  Administered 2020-12-02 – 2020-12-06 (×2): 6 via TOPICAL

## 2020-12-02 MED ORDER — MIDAZOLAM HCL 2 MG/2ML IJ SOLN
INTRAMUSCULAR | Status: AC
  Filled 2020-12-02: qty 2

## 2020-12-02 MED ORDER — PROPOFOL 10 MG/ML IV BOLUS
INTRAVENOUS | Status: DC | PRN
  Administered 2020-12-02: 30 mg via INTRAVENOUS

## 2020-12-02 MED ORDER — LIDOCAINE-EPINEPHRINE (PF) 1.5 %-1:200000 IJ SOLN
INTRAMUSCULAR | Status: DC | PRN
Start: 2020-12-02 — End: 2020-12-02
  Administered 2020-12-02: 10 mL via PERINEURAL

## 2020-12-02 MED ORDER — CHLORHEXIDINE GLUCONATE 0.12 % MT SOLN: 15.0000 mL | Freq: Once | OROMUCOSAL | Status: AC

## 2020-12-02 MED ORDER — FENTANYL CITRATE (PF) 100 MCG/2ML IJ SOLN
INTRAMUSCULAR | Status: AC
  Administered 2020-12-02: 100 ug via INTRAVENOUS
  Filled 2020-12-02: qty 2

## 2020-12-02 MED ORDER — LACTATED RINGERS IV SOLN: INTRAVENOUS | Status: DC

## 2020-12-02 MED ORDER — ACETAMINOPHEN 500 MG PO TABS: 1000.0000 mg | ORAL_TABLET | Freq: Once | ORAL | Status: DC | PRN

## 2020-12-02 MED ORDER — FENTANYL CITRATE (PF) 250 MCG/5ML IJ SOLN
INTRAMUSCULAR | Status: AC
  Filled 2020-12-02: qty 5

## 2020-12-02 MED ORDER — OXYCODONE HCL 5 MG PO TABS: 5.0000 mg | ORAL_TABLET | Freq: Once | ORAL | Status: AC | PRN

## 2020-12-02 MED ORDER — SODIUM CHLORIDE 0.9 % IV SOLN
2.0000 g | Freq: Once | INTRAVENOUS | Status: DC
  Administered 2020-12-02: 2 g via INTRAVENOUS
  Filled 2020-12-02 (×2): qty 2

## 2020-12-02 MED ORDER — FENTANYL CITRATE (PF) 100 MCG/2ML IJ SOLN
25.0000 ug | INTRAMUSCULAR | Status: DC | PRN
Start: 2020-12-02 — End: 2020-12-02

## 2020-12-02 MED ORDER — ACETAMINOPHEN 10 MG/ML IV SOLN
INTRAVENOUS | Status: AC
  Administered 2020-12-02: 1000 mg via INTRAVENOUS
  Filled 2020-12-02: qty 100

## 2020-12-02 MED ORDER — KETOROLAC TROMETHAMINE 15 MG/ML IJ SOLN
INTRAMUSCULAR | Status: AC
  Administered 2020-12-02: 30 mg
  Filled 2020-12-02: qty 2

## 2020-12-02 MED ORDER — ORAL CARE MOUTH RINSE: 15.0000 mL | Freq: Once | OROMUCOSAL | Status: AC

## 2020-12-02 MED ORDER — BUPIVACAINE-EPINEPHRINE (PF) 0.5% -1:200000 IJ SOLN
INTRAMUSCULAR | Status: DC | PRN
  Administered 2020-12-02: 20 mL via PERINEURAL

## 2020-12-02 SURGICAL SUPPLY — 27 items
BNDG ELASTIC 4X5.8 VLCR STR LF (GAUZE/BANDAGES/DRESSINGS) ×2 IMPLANT
BNDG GAUZE ELAST 4 BULKY (GAUZE/BANDAGES/DRESSINGS) ×2 IMPLANT
CANISTER SUCT 3000ML PPV (MISCELLANEOUS) ×2 IMPLANT
CNTNR URN SCR LID CUP LEK RST (MISCELLANEOUS) IMPLANT
CONT SPEC 4OZ STRL OR WHT (MISCELLANEOUS) ×1
COVER SURGICAL LIGHT HANDLE (MISCELLANEOUS) ×2 IMPLANT
COVER WAND RF STERILE (DRAPES) ×1 IMPLANT
DRAPE EXTREMITY T 121X128X90 (DISPOSABLE) ×2 IMPLANT
DRAPE HALF SHEET 40X57 (DRAPES) ×2 IMPLANT
DRSG EMULSION OIL 3X3 NADH (GAUZE/BANDAGES/DRESSINGS) ×2 IMPLANT
ELECT REM PT RETURN 9FT ADLT (ELECTROSURGICAL) ×2
ELECTRODE REM PT RTRN 9FT ADLT (ELECTROSURGICAL) ×1 IMPLANT
GAUZE SPONGE 4X4 12PLY STRL (GAUZE/BANDAGES/DRESSINGS) ×2 IMPLANT
GOWN STRL REUS W/ TWL LRG LVL3 (GOWN DISPOSABLE) ×3 IMPLANT
GOWN STRL REUS W/TWL LRG LVL3 (GOWN DISPOSABLE) ×3
KIT BASIN OR (CUSTOM PROCEDURE TRAY) ×2 IMPLANT
KIT TURNOVER KIT B (KITS) ×2 IMPLANT
NS IRRIG 1000ML POUR BTL (IV SOLUTION) ×2 IMPLANT
PACK GENERAL/GYN (CUSTOM PROCEDURE TRAY) ×2 IMPLANT
PAD ARMBOARD 7.5X6 YLW CONV (MISCELLANEOUS) ×4 IMPLANT
SUT ETHILON 3 0 PS 1 (SUTURE) ×2 IMPLANT
SUT VIC AB 3-0 SH 27 (SUTURE)
SUT VIC AB 3-0 SH 27X BRD (SUTURE) IMPLANT
TOWEL GREEN STERILE (TOWEL DISPOSABLE) ×4 IMPLANT
TOWEL GREEN STERILE FF (TOWEL DISPOSABLE) ×2 IMPLANT
UNDERPAD 30X36 HEAVY ABSORB (UNDERPADS AND DIAPERS) ×2 IMPLANT
WATER STERILE IRR 1000ML POUR (IV SOLUTION) ×2 IMPLANT

## 2020-12-02 NOTE — Plan of Care (Signed)
  Problem: Activity: Goal: Risk for activity intolerance will decrease Outcome: Progressing   Problem: Coping: Goal: Level of anxiety will decrease Outcome: Progressing   Problem: Pain Managment: Goal: General experience of comfort will improve Outcome: Progressing   Problem: Safety: Goal: Ability to remain free from injury will improve Outcome: Progressing   Problem: Skin Integrity: Goal: Risk for impaired skin integrity will decrease Outcome: Progressing   

## 2020-12-02 NOTE — Progress Notes (Signed)
Report called to SS. Transport here for pt.

## 2020-12-02 NOTE — Anesthesia Preprocedure Evaluation (Signed)
Anesthesia Evaluation  Patient identified by MRN, date of birth, ID band Patient awake    Reviewed: Allergy & Precautions, NPO status , Patient's Chart, lab work & pertinent test results  History of Anesthesia Complications Negative for: history of anesthetic complications  Airway Mallampati: II  TM Distance: >3 FB Neck ROM: Full    Dental  (+) Teeth Intact, Dental Advisory Given   Pulmonary asthma , COPD,  COPD inhaler, Current Smoker and Patient abstained from smoking.,    breath sounds clear to auscultation       Cardiovascular hypertension, Pt. on medications  Rhythm:Regular Rate:Normal  1. Left ventricular ejection fraction, by estimation, is 60 to 65%. The  left ventricle has normal function. The left ventricle has no regional  wall motion abnormalities. Left ventricular diastolic parameters were  normal.  2. Right ventricular systolic function is normal. The right ventricular  size is normal.  3. The pericardial effusion is anterior to the right ventricle.  4. The mitral valve was not well visualized. No evidence of mitral valve  regurgitation.  5. The aortic valve was not well visualized. Aortic valve regurgitation  is not visualized. Aortic valve area, by VTI measures 4.36 cm. Aortic  valve mean gradient measures 3.0 mmHg. Aortic valve Vmax measures 1.19  m/s.  6. Aortic dilatation noted. There is borderline dilatation of the aortic  root, measuring 37 mm.  7. The inferior vena cava is normal in size with greater than 50%  respiratory variability, suggesting right atrial pressure of 3 mmHg.  8. Poor image quality precludes adequate assessment of valves. Cannot  rule out vegetation on mitral valve in the apical 2 chamber view. The AV  cannot be assessed. Suggest TEE for further evaulation.    Neuro/Psych PSYCHIATRIC DISORDERS Anxiety Depression  Neuromuscular disease    GI/Hepatic Neg liver ROS, hiatal  hernia, GERD  Medicated,  Endo/Other  diabetes, Poorly Controlled, Type 2, Insulin Dependent, Oral Hypoglycemic Agents  Renal/GU negative Renal ROS     Musculoskeletal  (+) Arthritis , Cellulitis left toe   Abdominal (+)  Abdomen: soft. Bowel sounds: normal.  Peds  Hematology  (+) Blood dyscrasia, anemia , Lab Results      Component                Value               Date                      WBC                      9.3                 12/02/2020                HGB                      10.7 (L)            12/02/2020                HCT                      32.9 (L)            12/02/2020                MCV  85.9                12/02/2020                PLT                      474 (H)             12/02/2020              Anesthesia Other Findings   Reproductive/Obstetrics                             Anesthesia Physical Anesthesia Plan  ASA: III  Anesthesia Plan: MAC and Regional   Post-op Pain Management:    Induction:   PONV Risk Score and Plan: 0 and Propofol infusion and Treatment may vary due to age or medical condition  Airway Management Planned: Nasal Cannula  Additional Equipment: None  Intra-op Plan:   Post-operative Plan:   Informed Consent: I have reviewed the patients History and Physical, chart, labs and discussed the procedure including the risks, benefits and alternatives for the proposed anesthesia with the patient or authorized representative who has indicated his/her understanding and acceptance.     Dental advisory given  Plan Discussed with: CRNA and Surgeon  Anesthesia Plan Comments:         Anesthesia Quick Evaluation

## 2020-12-02 NOTE — Anesthesia Procedure Notes (Signed)
Anesthesia Regional Block: Popliteal block   Pre-Anesthetic Checklist: ,, timeout performed, Correct Patient, Correct Site, Correct Laterality, Correct Procedure, Correct Position, site marked, Risks and benefits discussed,  Surgical consent,  Pre-op evaluation,  At surgeon's request and post-op pain management  Laterality: Left and Lower  Prep: chloraprep       Needles:  Injection technique: Single-shot     Needle Length: 9cm  Needle Gauge: 22     Additional Needles: Arrow StimuQuik ECHO Echogenic Stimulating PNB Needle  Procedures:,,,, ultrasound used (permanent image in chart),,,,  Narrative:  Start time: 12/02/2020 3:29 PM End time: 12/02/2020 3:33 PM Injection made incrementally with aspirations every 5 mL.  Performed by: Personally  Anesthesiologist: Oleta Mouse, MD

## 2020-12-02 NOTE — Progress Notes (Signed)
Occupational Therapy Treatment Patient Details Name: EDWARD GUTHMILLER MRN: [id number] DOB: [birth year] Today's Date: 12/02/2020    History of present illness Patient is a 50 y/o male who presented directly from [facility] clinic with back pain on 3/18 with concern for osteomyelitis T9-T10. MRI + for T9-T10 osteomyelitis. PMH: chronic pancreatitis, R transmet amputation, alcohol use dependence, anxiety, GERD, diabetic neuropathy, BPH, HTN, DM   OT comments  Pt making progress with functional goals. Pt awaiting surgery later this afternoon. OT will continue to follow acutely, update POC and d/c plans as needed to maximize level of function and safety  Follow Up Recommendations  No OT follow up    Equipment Recommendations  Other (comment) (reacher, LH bath sponge, sock aid)    Recommendations for Other Services      Precautions / Restrictions Precautions Precautions: Fall Precaution Comments: reviewed back precautions, log roll to get out of bed Restrictions Weight Bearing Restrictions: No       Mobility Bed Mobility Overal bed mobility: Modified Independent                  Transfers Overall transfer level: Needs assistance Equipment used: Straight cane Transfers: Sit to/from Stand Sit to Stand: Supervision         General transfer comment: supervision for safety    Balance Overall balance assessment: Mild deficits observed, not formally tested                                         ADL either performed or assessed with clinical judgement   ADL Overall ADL's : Needs assistance/impaired     Grooming: Wash/dry hands;Wash/dry face;Min guard;Standing       Lower Body Bathing: Minimal assistance;Sitting/lateral leans Lower Body Bathing Details (indicate cue type and reason): simulated       Lower Body Dressing Details (indicate cue type and reason): able to don socks long sitting in bed Toilet Transfer: Supervision/safety;Ambulation Toilet  Transfer Details (indicate cue type and reason): used cane Toileting- Clothing Manipulation and Hygiene: Min guard;Sit to/from stand       Functional mobility during ADLs: Supervision/safety       Vision Baseline Vision/History: Wears glasses Patient Visual Report: No change from baseline     Perception     Praxis      Cognition Arousal/Alertness: Awake/alert Behavior During Therapy: WFL for tasks assessed/performed Overall Cognitive Status: Within Functional Limits for tasks assessed                                          Exercises     Shoulder Instructions       General Comments      Pertinent Vitals/ Pain       Pain Assessment: Faces Faces Pain Scale: Hurts little more Pain Location: mid back and R groin Pain Descriptors / Indicators: Aching;Discomfort;Grimacing Pain Intervention(s): Monitored during session;Repositioned  Home Living                                          Prior Functioning/Environment              Frequency  Min 2X/week        Progress  Toward Goals  OT Goals(current goals can now be found in the care plan section)     Acute Rehab OT Goals Patient Stated Goal: to reduce pain and be able to care for myself  Plan Discharge plan remains appropriate    Co-evaluation                 AM-PAC OT "6 Clicks" Daily Activity     Outcome Measure   Help from another person eating meals?: None Help from another person taking care of personal grooming?: A Little Help from another person toileting, which includes using toliet, bedpan, or urinal?: A Little Help from another person bathing (including washing, rinsing, drying)?: A Little Help from another person to put on and taking off regular upper body clothing?: None Help from another person to put on and taking off regular lower body clothing?: A Little 6 Click Score: 20    End of Session Equipment Utilized During Treatment: Other  (comment) (cane)  OT Visit Diagnosis: Unsteadiness on feet (R26.81);Pain Pain - Right/Left: Right Pain - part of body: Leg;Ankle and joints of foot   Activity Tolerance Patient tolerated treatment well   Patient Left in bed;with call bell/phone within reach;with family/visitor present   Nurse Communication          Time: 0258-5277 OT Time Calculation (min): 25 min  Charges: OT General Charges $OT Visit: 1 Visit OT Treatments $Self Care/Home Management : 8-22 mins $Therapeutic Activity: 8-22 mins    Britt Bottom 12/02/2020, 2:06 PM

## 2020-12-02 NOTE — Progress Notes (Signed)
Subjective:  Christopher Fernandez is concerned about the area of his first amputation where Christopher Fernandez says has been swollen for several weeks and Christopher Fernandez is wondering if there is still infection present there   Antibiotics:  Anti-infectives (From admission, onward)   Start     Dose/Rate Route Frequency Ordered Stop   12/01/20 1845  [MAR Hold]  fluconazole (DIFLUCAN) tablet 200 mg        (MAR Hold since Mon 12/02/2020 at 1319.Hold Reason: Transfer to a Procedural area.)   200 mg Oral Daily 12/01/20 1753 12/08/20 0959   12/01/20 1400  ceFEPIme (MAXIPIME) 2 g in sodium chloride 0.9 % 100 mL IVPB  Status:  Discontinued        2 g 200 mL/hr over 30 Minutes Intravenous Every 8 hours 12/01/20 1007 12/02/20 0910   12/01/20 1045  metroNIDAZOLE (FLAGYL) tablet 500 mg  Status:  Discontinued        500 mg Oral Every 12 hours 12/01/20 0951 12/02/20 0910   12/01/20 0500  [MAR Hold]  vancomycin (VANCOREADY) IVPB 1250 mg/250 mL        (MAR Hold since Mon 12/02/2020 at 1319.Hold Reason: Transfer to a Procedural area.)   1,250 mg 166.7 mL/hr over 90 Minutes Intravenous Every 12 hours 11/30/20 1641     11/30/20 1645  vancomycin (VANCOREADY) IVPB 1500 mg/300 mL        1,500 mg 150 mL/hr over 120 Minutes Intravenous  Once 11/30/20 1556 11/30/20 1925   11/30/20 1645  piperacillin-tazobactam (ZOSYN) IVPB 3.375 g  Status:  Discontinued        3.375 g 12.5 mL/hr over 240 Minutes Intravenous Every 8 hours 11/30/20 1556 12/01/20 0951   11/30/20 1000  fluconazole (DIFLUCAN) tablet 100 mg  Status:  Discontinued        100 mg Oral Daily 11/29/20 1916 11/29/20 1952      Medications: Scheduled Meds: . [MAR Hold] acetaminophen  1,000 mg Oral Q6H  . [MAR Hold] ARIPiprazole  5 mg Oral QPM  . [MAR Hold] Chlorhexidine Gluconate Cloth  6 each Topical Q0600  . [MAR Hold] DULoxetine  60 mg Oral Daily  . [MAR Hold] fluconazole  200 mg Oral Daily  . [MAR Hold] gabapentin  600 mg Oral TID  . [MAR Hold] insulin aspart  0-9 Units  Subcutaneous TID WC  . [MAR Hold] lidocaine  1 patch Transdermal Q24H  . [MAR Hold] lipase/protease/amylase  36,000 Units Oral TID WC  . [MAR Hold] mirtazapine  30 mg Oral QHS  . [MAR Hold] mometasone-formoterol  2 puff Inhalation BID  . [MAR Hold] montelukast  10 mg Oral q AM  . [MAR Hold] mupirocin ointment  1 application Nasal BID  . [MAR Hold] pantoprazole  40 mg Oral Daily  . [MAR Hold] propranolol ER  80 mg Oral Daily   Continuous Infusions: . lactated ringers 10 mL/hr at 12/02/20 1335  . [MAR Hold] vancomycin 1,250 mg (12/02/20 0432)   PRN Meds:.[MAR Hold] albuterol, [MAR Hold] ketorolac, [MAR Hold] oxyCODONE    Objective: Weight change:   Intake/Output Summary (Last 24 hours) at 12/02/2020 1442 Last data filed at 12/02/2020 0900 Gross per 24 hour  Intake 590 ml  Output 1500 ml  Net -910 ml   Blood pressure 124/87, pulse 66, temperature 97.9 F (36.6 C), temperature source Oral, resp. rate 18, SpO2 98 %. Temp:  [97.8 F (36.6 C)-98 F (36.7 C)] 97.9 F (36.6 C) (03/21 0817) Pulse Rate:  [61-78]  66 (03/21 0817) Resp:  [18] 18 (03/21 0817) BP: (108-124)/(64-87) 124/87 (03/21 0817) SpO2:  [96 %-99 %] 98 % (03/21 0817)  Physical Exam: Physical Exam Constitutional:      Appearance: Christopher Fernandez is well-developed.  HENT:     Head: Normocephalic and atraumatic.  Eyes:     Conjunctiva/sclera: Conjunctivae normal.  Cardiovascular:     Rate and Rhythm: Normal rate and regular rhythm.  Pulmonary:     Effort: Pulmonary effort is normal. No respiratory distress.     Breath sounds: No wheezing.  Abdominal:     General: There is no distension.     Palpations: Abdomen is soft.  Musculoskeletal:        General: Normal range of motion.     Cervical back: Normal range of motion and neck supple.     Right hip: Tenderness present.       Legs:  Skin:    General: Skin is warm and dry.     Findings: No erythema or rash.  Neurological:     General: No focal deficit present.      Mental Status: Christopher Fernandez is alert and oriented to person, place, and time.  Psychiatric:        Mood and Affect: Mood normal.        Behavior: Behavior normal.        Thought Content: Thought content normal.        Judgment: Judgment normal.     Left foot visualized with necrotic toe and adjacent area where Christopher Fernandez had amputation    CBC:    BMET Recent Labs    12/01/20 0112 12/02/20 0302  NA 131* 132*  K 3.7 4.3  CL 98 100  CO2 23 25  GLUCOSE 153* 289*  BUN 12 15  CREATININE 0.93 0.99  CALCIUM 8.4* 8.6*     Liver Panel  Recent Labs    11/29/20 1814  PROT 6.5  ALBUMIN 2.6*  AST 18  ALT 15  ALKPHOS 90  BILITOT 0.7       Sedimentation Rate No results for input(s): ESRSEDRATE in the last 72 hours. C-Reactive Protein No results for input(s): CRP in the last 72 hours.  Micro Results: Recent Results (from the past 720 hour(s))  Culture, blood (routine x 2)     Status: Abnormal   Collection Time: 11/29/20  7:37 PM   Specimen: BLOOD LEFT ARM  Result Value Ref Range Status   Specimen Description BLOOD LEFT ARM  Final   Special Requests   Final    BOTTLES DRAWN AEROBIC ONLY Blood Culture adequate volume   Culture  Setup Time   Final    GRAM POSITIVE COCCI IN CLUSTERS AEROBIC BOTTLE ONLY CRITICAL RESULT CALLED TO, READ BACK BY AND VERIFIED WITH: L,CHEN PHARMD @1951  11/30/20 EB Performed at [facility], [address] [place], [place] [address]    Culture METHICILLIN RESISTANT STAPHYLOCOCCUS AUREUS (A)  Final   Report Status 12/02/2020 FINAL  Final   Organism ID, Bacteria METHICILLIN RESISTANT STAPHYLOCOCCUS AUREUS  Final      Susceptibility   Methicillin resistant staphylococcus aureus - MIC*    CIPROFLOXACIN <=0.5 SENSITIVE Sensitive     ERYTHROMYCIN >=8 RESISTANT Resistant     GENTAMICIN <=0.5 SENSITIVE Sensitive     OXACILLIN >=4 RESISTANT Resistant     TETRACYCLINE 2 SENSITIVE Sensitive     VANCOMYCIN 1 SENSITIVE Sensitive     TRIMETH/SULFA <=10  SENSITIVE Sensitive     CLINDAMYCIN <=0.25 SENSITIVE  Sensitive     RIFAMPIN <=0.5 SENSITIVE Sensitive     Inducible Clindamycin NEGATIVE Sensitive     * METHICILLIN RESISTANT STAPHYLOCOCCUS AUREUS  Culture, blood (routine x 2)     Status: Abnormal   Collection Time: 11/29/20  7:37 PM   Specimen: BLOOD LEFT ARM  Result Value Ref Range Status   Specimen Description BLOOD LEFT ARM  Final   Special Requests   Final    BOTTLES DRAWN AEROBIC ONLY Blood Culture adequate volume   Culture  Setup Time   Final    GRAM POSITIVE COCCI IN CLUSTERS AEROBIC BOTTLE ONLY CRITICAL VALUE NOTED.  VALUE IS CONSISTENT WITH PREVIOUSLY REPORTED AND CALLED VALUE.    Culture (A)  Final    STAPHYLOCOCCUS AUREUS SUSCEPTIBILITIES PERFORMED ON PREVIOUS CULTURE WITHIN THE LAST 5 DAYS. Performed at [facility], [place] [address] [place], [place]  [address]    Report Status 12/02/2020 FINAL  Final  Blood Culture ID Panel (Reflexed)     Status: Abnormal   Collection Time: 11/29/20  7:37 PM  Result Value Ref Range Status   Enterococcus faecalis NOT DETECTED NOT DETECTED Final   Enterococcus Faecium NOT DETECTED NOT DETECTED Final   Listeria monocytogenes NOT DETECTED NOT DETECTED Final   Staphylococcus species DETECTED (A) NOT DETECTED Final    Comment: CRITICAL RESULT CALLED TO, READ BACK BY AND VERIFIED WITH: L,CHEN PHARMD @1953  11/30/20 EB    Staphylococcus aureus (BCID) DETECTED (A) NOT DETECTED Final    Comment: Methicillin (oxacillin)-resistant Staphylococcus aureus (MRSA). MRSA is predictably resistant to beta-lactam antibiotics (except ceftaroline). Preferred therapy is vancomycin unless clinically contraindicated. Patient requires contact precautions if  hospitalized. CRITICAL RESULT CALLED TO, READ BACK BY AND VERIFIED WITH: L,CHEN PHARMD @1951  11/30/20 EB    Staphylococcus epidermidis NOT DETECTED NOT DETECTED Final   Staphylococcus lugdunensis NOT DETECTED NOT DETECTED Final   Streptococcus  species NOT DETECTED NOT DETECTED Final   Streptococcus agalactiae NOT DETECTED NOT DETECTED Final   Streptococcus pneumoniae NOT DETECTED NOT DETECTED Final   Streptococcus pyogenes NOT DETECTED NOT DETECTED Final   A.calcoaceticus-baumannii NOT DETECTED NOT DETECTED Final   Bacteroides fragilis NOT DETECTED NOT DETECTED Final   Enterobacterales NOT DETECTED NOT DETECTED Final   Enterobacter cloacae complex NOT DETECTED NOT DETECTED Final   Escherichia coli NOT DETECTED NOT DETECTED Final   Klebsiella aerogenes NOT DETECTED NOT DETECTED Final   Klebsiella oxytoca NOT DETECTED NOT DETECTED Final   Klebsiella pneumoniae NOT DETECTED NOT DETECTED Final   Proteus species NOT DETECTED NOT DETECTED Final   Salmonella species NOT DETECTED NOT DETECTED Final   Serratia marcescens NOT DETECTED NOT DETECTED Final   Haemophilus influenzae NOT DETECTED NOT DETECTED Final   Neisseria meningitidis NOT DETECTED NOT DETECTED Final   Pseudomonas aeruginosa NOT DETECTED NOT DETECTED Final   Stenotrophomonas maltophilia NOT DETECTED NOT DETECTED Final   Candida albicans NOT DETECTED NOT DETECTED Final   Candida auris NOT DETECTED NOT DETECTED Final   Candida glabrata NOT DETECTED NOT DETECTED Final   Candida krusei NOT DETECTED NOT DETECTED Final   Candida parapsilosis NOT DETECTED NOT DETECTED Final   Candida tropicalis NOT DETECTED NOT DETECTED Final   Cryptococcus neoformans/gattii NOT DETECTED NOT DETECTED Final   Meth resistant mecA/C and MREJ DETECTED (A) NOT DETECTED Final    Comment: CRITICAL RESULT CALLED TO, READ BACK BY AND VERIFIED WITH: L,CHEN PHARMD @1951  11/30/20 EB Performed at [facility], [address], [place], [place] [address]  Culture, Urine     Status: None   Collection Time: 11/29/20  9:25 PM   Specimen: Urine, Random  Result Value Ref Range Status   Specimen Description URINE, RANDOM  Final   Special Requests NONE  Final   Culture   Final    NO GROWTH Performed  at [facility], [address] [place], [place] [address]    Report Status 12/01/2020 FINAL  Final  Culture, blood (routine x 2)     Status: None (Preliminary result)   Collection Time: 12/01/20 10:16 AM   Specimen: BLOOD LEFT HAND  Result Value Ref Range Status   Specimen Description BLOOD LEFT HAND  Final   Special Requests   Final    BOTTLES DRAWN AEROBIC AND ANAEROBIC Blood Culture results may not be optimal due to an inadequate volume of blood received in culture bottles   Culture   Final    NO GROWTH < 24 HOURS Performed at [facility], [place] [address] [place], [place] [address]    Report Status PENDING  Incomplete  Culture, blood (routine x 2)     Status: None (Preliminary result)   Collection Time: 12/01/20 10:23 AM   Specimen: BLOOD RIGHT HAND  Result Value Ref Range Status   Specimen Description BLOOD RIGHT HAND  Final   Special Requests   Final    BOTTLES DRAWN AEROBIC AND ANAEROBIC Blood Culture adequate volume   Culture   Final    NO GROWTH < 24 HOURS Performed at [facility], [place] [address], [place], [place] [address]    Report Status PENDING  Incomplete  Surgical pcr screen     Status: Abnormal   Collection Time: 12/01/20 10:15 PM   Specimen: Nasal Mucosa; Nasal Swab  Result Value Ref Range Status   MRSA, PCR POSITIVE (A) NEGATIVE Final    Comment: RESULT CALLED TO, READ BACK BY AND VERIFIED WITH: ROGERS,P RN 0001 12/02/2020 MITCHELL,L    Staphylococcus aureus POSITIVE (A) NEGATIVE Final    Comment: (NOTE) The Xpert SA Assay (FDA approved for NASAL specimens in patients 11 years of age and older), is one component of a comprehensive surveillance program. It is not intended to diagnose infection nor to guide or monitor treatment. Performed at [facility], [place] [name], [place], [place] [address]   Resp Panel by RT-PCR (Flu A&B, Covid) Nasopharyngeal Swab     Status: None   Collection Time: 12/02/20  8:15 AM   Specimen:  Nasopharyngeal Swab; Nasopharyngeal(NP) swabs in vial transport medium  Result Value Ref Range Status   SARS Coronavirus 2 by RT PCR NEGATIVE NEGATIVE Final    Comment: (NOTE) SARS-CoV-2 target nucleic acids are NOT DETECTED.  The SARS-CoV-2 RNA is generally detectable in upper respiratory specimens during the acute phase of infection. The lowest concentration of SARS-CoV-2 viral copies this assay can detect is 138 copies/mL. A negative result does not preclude SARS-Cov-2 infection and should not be used as the sole basis for treatment or other patient management decisions. A negative result may occur with  improper specimen collection/handling, submission of specimen other than nasopharyngeal swab, presence of viral mutation(s) within the areas targeted by this assay, and inadequate number of viral copies(<138 copies/mL). A negative result must be combined with clinical observations, patient history, and epidemiological information. The expected result is Negative.  Fact Sheet for Patients:  EntrepreneurPulse.com.au  Fact Sheet for Healthcare Providers:  IncredibleEmployment.be  This test is no t yet  approved or cleared by the [place] and  has been authorized for detection and/or diagnosis of SARS-CoV-2 by FDA under an Emergency Use Authorization (EUA). This EUA will remain  in effect (meaning this test can be used) for the duration of the COVID-19 declaration under Section 564(b)(1) of the Act, 21 U.S.C.section 360bbb-3(b)(1), unless the authorization is terminated  or revoked sooner.       Influenza A by PCR NEGATIVE NEGATIVE Final   Influenza B by PCR NEGATIVE NEGATIVE Final    Comment: (NOTE) The Xpert Xpress SARS-CoV-2/FLU/RSV plus assay is intended as an aid in the diagnosis of influenza from Nasopharyngeal swab specimens and should not be used as a sole basis for treatment. Nasal washings and aspirates are unacceptable for  Xpert Xpress SARS-CoV-2/FLU/RSV testing.  Fact Sheet for Patients: EntrepreneurPulse.com.au  Fact Sheet for Healthcare Providers: IncredibleEmployment.be  This test is not yet approved or cleared by the [place] FDA and has been authorized for detection and/or diagnosis of SARS-CoV-2 by FDA under an Emergency Use Authorization (EUA). This EUA will remain in effect (meaning this test can be used) for the duration of the COVID-19 declaration under Section 564(b)(1) of the Act, 21 U.S.C. section 360bbb-3(b)(1), unless the authorization is terminated or revoked.  Performed at [facility], [place] [address], [name], [place] [address]     Studies/Results: ECHOCARDIOGRAM COMPLETE  Result Date: 12/01/2020    ECHOCARDIOGRAM REPORT   Patient Name:   Christopher Fernandez Date of Exam: 12/01/2020 Medical Rec #:  [id number]      Height:       75.0 in Accession #:    [id number]     Weight:       249.0 lb Date of Birth:  [birth year]       BSA:          2.409 m Patient Age:    19 years       BP:           115/79 mmHg Patient Gender: M              HR:           83 bpm. Exam Location:  Inpatient Procedure: 2D Echo, Cardiac Doppler and Color Doppler Indications:    Bacteremia  History:        Patient has no prior history of Echocardiogram examinations.                 COPD; Risk Factors:Current Smoker and Diabetes. GERD. ETOH                 abuse.  Sonographer:    Clayton Lefort RDCS (AE) Referring Phys: [id number] [facility] T VU  Sonographer Comments: Technically difficult study due to poor echo windows, suboptimal parasternal window, suboptimal apical window and suboptimal subcostal window. Image acquisition challenging due to COPD and Image acquisition challenging due to respiratory motion. Limited patient mobility. IMPRESSIONS  1. Left ventricular ejection fraction, by estimation, is 60 to 65%. The left ventricle has normal function. The left ventricle has no regional wall motion  abnormalities. Left ventricular diastolic parameters were normal.  2. Right ventricular systolic function is normal. The right ventricular size is normal.  3. The pericardial effusion is anterior to the right ventricle.  4. The mitral valve was not well visualized. No evidence of mitral valve regurgitation.  5. The aortic valve was not well visualized. Aortic valve regurgitation is not visualized. Aortic valve area, by VTI measures 4.36  cm. Aortic valve mean gradient measures 3.0 mmHg. Aortic valve Vmax measures 1.19 m/s.  6. Aortic dilatation noted. There is borderline dilatation of the aortic root, measuring 37 mm.  7. The inferior vena cava is normal in size with greater than 50% respiratory variability, suggesting right atrial pressure of 3 mmHg.  8. Poor image quality precludes adequate assessment of valves. Cannot rule out vegetation on mitral valve in the apical 2 chamber view. The AV cannot be assessed. Suggest TEE for further evaulation. FINDINGS  Left Ventricle: Left ventricular ejection fraction, by estimation, is 60 to 65%. The left ventricle has normal function. The left ventricle has no regional wall motion abnormalities. The left ventricular internal cavity size was normal in size. There is  no left ventricular hypertrophy. Left ventricular diastolic parameters were normal. Normal left ventricular filling pressure. Right Ventricle: The right ventricular size is normal. No increase in right ventricular wall thickness. Right ventricular systolic function is normal. Left Atrium: Left atrial size was normal in size. Right Atrium: Right atrial size was normal in size. Pericardium: Trivial pericardial effusion is present. The pericardial effusion is anterior to the right ventricle. Mitral Valve: The mitral valve was not well visualized. Mild mitral annular calcification. No evidence of mitral valve regurgitation. Tricuspid Valve: The tricuspid valve is normal in structure. Tricuspid valve regurgitation is  trivial. No evidence of tricuspid stenosis. Aortic Valve: The aortic valve was not well visualized. Aortic valve regurgitation is not visualized. Aortic valve mean gradient measures 3.0 mmHg. Aortic valve peak gradient measures 5.7 mmHg. Aortic valve area, by VTI measures 4.36 cm. Pulmonic Valve: The pulmonic valve was not well visualized. Pulmonic valve regurgitation is not visualized. Aorta: The aortic root is normal in size and structure and aortic dilatation noted. There is borderline dilatation of the aortic root, measuring 37 mm. Venous: The inferior vena cava is normal in size with greater than 50% respiratory variability, suggesting right atrial pressure of 3 mmHg. IAS/Shunts: No atrial level shunt detected by color flow Doppler.  LEFT VENTRICLE PLAX 2D LVIDd:         4.30 cm  Diastology LVIDs:         3.10 cm  LV e' medial:    12.30 cm/s LV PW:         1.60 cm  LV E/e' medial:  5.7 LV IVS:        1.00 cm  LV e' lateral:   9.57 cm/s LVOT diam:     2.40 cm  LV E/e' lateral: 7.4 LV SV:         84 LV SV Index:   35 LVOT Area:     4.52 cm  RIGHT VENTRICLE             IVC RV S prime:     12.30 cm/s  IVC diam: 2.30 cm TAPSE (M-mode): 2.9 cm LEFT ATRIUM           Index       RIGHT ATRIUM           Index LA diam:      4.20 cm 1.74 cm/m  RA Area:     21.00 cm LA Vol (A4C): 43.8 ml 18.19 ml/m RA Volume:   63.80 ml  26.49 ml/m  AORTIC VALVE AV Area (Vmax):    3.99 cm AV Area (Vmean):   4.31 cm AV Area (VTI):     4.36 cm AV Vmax:           119.00  cm/s AV Vmean:          74.800 cm/s AV VTI:            0.192 m AV Peak Grad:      5.7 mmHg AV Mean Grad:      3.0 mmHg LVOT Vmax:         105.00 cm/s LVOT Vmean:        71.300 cm/s LVOT VTI:          0.185 m LVOT/AV VTI ratio: 0.96  AORTA Ao Root diam: 3.70 cm MITRAL VALVE MV Area (PHT): 3.65 cm    SHUNTS MV Decel Time: 208 msec    Systemic VTI:  0.18 m MV E velocity: 70.70 cm/s  Systemic Diam: 2.40 cm MV A velocity: 56.40 cm/s MV E/A ratio:  1.25 Fransico Him MD  Electronically signed by Fransico Him MD Signature Date/Time: 12/01/2020/4:45:26 PM    Final       Assessment/Plan:  INTERVAL HISTORY: Patient to go for surgery today.   Active Problems:   Diabetic foot infection ([place])   Osteomyelitis ([place])   MRSA bacteremia   Right hip pain    Christopher Fernandez is a 50 y.o. male with with diabetes mellitus I saw in 2021 when Christopher Fernandez had a nonhealing wound and underwent a right transmetatarsal amputation which we treated with vancomycin cefepime and Flagyl in the past.  Christopher Fernandez was admitted with back pain and found on MRI to have T9-T10 discitis and osteomyelitis.  Christopher Fernandez also has clear necrotic infection involving his second toe.  Blood cultures have been positive for methicillin-resistant Staphylococcus aureus.  RI of the foot shows evidence of osteomyelitis in the distal phalanx of the second toe as well as edema enhancement in the proximal middle phalange of the second toe and plantar surface of the head of the second metatarsal  There is also edema and enhancement in the head and neck of the first metatarsal and medial and lateral sesamoids that were also working for osteomyelitis.  MRI of the hip is pending.  Surgery is planned today for the second toe  The patient as mentioned was concerned about the prior amputation site still being swollen.  I have conveyed his concerns and the concerns based on the MRI findings to vascular surgery.  I am narrowing his antibacterial coverage to vancomycin as nice as I suspect MRSA is the culprit in his spine and likely came from his foot.  Radiology are going to aspirate the disc space for culture.   Candidal esophagitis: This was seen on EGD we will continue fluconazole  I spent greater than 35 minutes with the patient including greater than 50% of time in face to face counsel of the patient wife regarding the nature of osteomyelitis bloodstream infections and their treatment and work-up and in coordination with  vascular surgery.       LOS: 3 days   Alcide Evener 12/02/2020, 2:42 PM

## 2020-12-02 NOTE — Consult Note (Signed)
Chief Complaint: Patient was seen in consultation today for Thoracic 9-10 disc aspiration at the request of Dr Darnelle Maffucci   Supervising Physician: Luanne Bras  Patient Status: [facility]  History of Present Illness: Christopher Fernandez is a 50 y.o. male   Smoker; COPD; GERD; DM; anxiety; alcohol use- chronic pancreatitis Admitted 3/198/22 with severe bcck pain; sepsis MRSA bacteremia; left foot infection (diabetic) Denies IV drug use  Back pain 6 weeks +chills; wt loss No weakness  Imaging: 11/30/20 MRI:  IMPRESSION: 1. Positive for T9-T10 Discitis Osteomyelitis. Marrow edema also involving the posterior right 10th rib, T10 posterior elements. Epidural and paraspinal soft tissue inflammation eccentric to the right. Associated dural thickening and enhancement. But no abscess at this time. 2. No thoracic spinal stenosis. No age advanced degenerative changes in the thoracic spine.  Request per Dr Gale Journey for disc aspiration   Past Medical History:  Diagnosis Date  . Alcoholism ([place])   . Anemia   . Anxiety   . Arthritis   . Asthma   . Blood transfusion without reported diagnosis   . COPD (chronic obstructive pulmonary disease) ([place])   . Depression   . Diabetes ([place])    type 2  . GERD (gastroesophageal reflux disease)   . History of hiatal hernia   . HTN (hypertension)   . Neuromuscular disorder ([facility])    tremors  . Osteomyelitis ([place])    right forefoot  . Pancreatitis   . Pneumonia   . Substance abuse ([place])   . 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 Wears glasses     Past Surgical History:  Procedure Laterality Date  . 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 AMPUTATION Right 10/18/2019   Procedure: RIGHT SECOND TOE AMPUTATION;  Surgeon: Newt Minion, MD;  Location: [place];  Service: Orthopedics;  Laterality: Right;  . AMPUTATION Right 11/08/2019   Procedure: RIGHT TRANSMETATARSAL AMPUTATION;  Surgeon: Newt Minion, MD;  Location: [place];   Service: Orthopedics;  Laterality: Right;  . APPLICATION OF WOUND VAC Right 01/10/2020   Procedure: APPLICATION OF WOUND VAC, right foot;  Surgeon: [name], MD;  Location: [place];  Service: Vascular;  Laterality: Right;  . CHOLECYSTECTOMY  2006   with gallbladder and spleen  . COLONOSCOPY  8-10 years ago    in [place] exam per pt  . HERNIA REPAIR  2008, 2010   hiatal hernia and 1 additional  . NASAL SEPTUM SURGERY    . PANCREATIC PSEUDOCYST DRAINAGE    . SPLENECTOMY  2006  . STUMP REVISION Right 11/24/2019   Procedure: REVISION RIGHT TRANSMETATARSAL AMPUTATION;  Surgeon: Newt Minion, MD;  Location: [place];  Service: Orthopedics;  Laterality: Right;  . TOTAL KNEE ARTHROPLASTY Right 1982, 1984  . TRANSMETATARSAL AMPUTATION Left 09/06/2020   Procedure: AMPUTATION LEFT GREAT TOE;  Surgeon: Waynetta Sandy, MD;  Location: [place];  Service: Vascular;  Laterality: Left;  . WOUND DEBRIDEMENT Right 01/10/2020   Procedure: incisional DEBRIDEMENT of RIGHT TRANSMETATARSAL WOUND;  Surgeon: [name], MD;  Location: [place];  Service: Vascular;  Laterality: Right;    Allergies: Eggs or egg-derived products, Morphine and related, and Cocoa  Medications: Prior to Admission medications   Medication Sig Start Date End Date Taking? Authorizing Provider  acetaminophen (TYLENOL 8 HOUR) 650 MG CR tablet Take 1 tablet (650 mg total) by mouth every 8 (eight) hours as needed for pain. 09/07/20  Yes Jeani Hawking,  Barnetta Chapel, MD  ADVAIR DISKUS 250-50 MCG/DOSE AEPB INHALE 1 PUFF INTO THE LUNGS 2 TIMES DAILY. Patient taking differently: Inhale 1 puff into the lungs in the morning and at bedtime. 11/20/20  Yes Mullis, Kiersten P, DO  albuterol (VENTOLIN HFA) 108 (90 Base) MCG/ACT inhaler INHALE 2-4 PUFFS INTO THE LUNGS EVERY 4 (FOUR) HOURS AS NEEDED FOR WHEEZING (OR COUGH). 11/20/20  Yes Mullis, Kiersten P, DO  ARIPiprazole (ABILIFY) 5 MG tablet TAKE 1 TABLET (5 MG TOTAL) BY MOUTH  DAILY. Patient taking differently: Take 5 mg by mouth every evening. 11/05/20  Yes Mullis, Kiersten P, DO  atorvastatin (LIPITOR) 40 MG tablet Take 1 tablet (40 mg total) by mouth daily. 12/12/19  Yes Mullis, Kiersten P, DO  diclofenac (VOLTAREN) 50 MG EC tablet Take 1 tablet (50 mg total) by mouth 3 (three) times daily. 11/13/20  Yes Mullis, Kiersten P, DO  DULoxetine (CYMBALTA) 60 MG capsule TAKE 1 CAPSULE (60 MG TOTAL) BY MOUTH DAILY. 11/05/20  Yes Mullis, Kiersten P, DO  fluconazole (DIFLUCAN) 100 MG tablet Take 1 tablet (100 mg total) by mouth daily. 11/27/20  Yes Danis, Kirke Corin, MD  fluticasone-salmeterol (ADVAIR HFA) 115-21 MCG/ACT inhaler Inhale 1 puff into the lungs 2 (two) times daily.   Yes [provider]  gabapentin (NEURONTIN) 300 MG capsule Take 2 capsules (600 mg total) by mouth 3 (three) times daily. 11/13/20  Yes Mullis, Kiersten P, DO  Insulin Glargine-yfgn 100 UNIT/ML SOPN Inject 18 Units into the skin in the morning. 11/05/20  Yes Mullis, Kiersten P, DO  insulin lispro (HUMALOG KWIKPEN) 100 UNIT/ML KwikPen Inject 6-10 Units into the skin 3 (three) times daily. 11/05/20  Yes Mullis, Kiersten P, DO  JANUVIA 100 MG tablet TAKE 1 TABLET (100 MG TOTAL) BY MOUTH DAILY. 11/05/20  Yes Mullis, Kiersten P, DO  JARDIANCE 25 MG TABS tablet TAKE 1 TABLET BY MOUTH DAILY. 11/05/20  Yes Mullis, Kiersten P, DO  metFORMIN (GLUCOPHAGE) 1000 MG tablet TAKE 1 TABLET (1,000 MG TOTAL) BY MOUTH 2 (TWO) TIMES DAILY WITH A MEAL. 06/11/20  Yes Mullis, Kiersten P, DO  mirtazapine (REMERON) 30 MG tablet Take 1 tablet (30 mg total) by mouth at bedtime. 09/20/19  Yes Mullis, Kiersten P, DO  montelukast (SINGULAIR) 10 MG tablet TAKE 1 TABLET (10 MG TOTAL) BY MOUTH AT BEDTIME. Patient taking differently: Take 10 mg by mouth in the morning. 11/05/20  Yes Mullis, Kiersten P, DO  PANCRELIPASE, LIP-PROT-AMYL, PO Take 36,000 Units by mouth with breakfast, with lunch, and with evening meal.   Yes [provider]   pantoprazole (PROTONIX) 40 MG tablet Take 1 tablet (40 mg total) by mouth 2 (two) times daily. 11/13/20  Yes Mullis, Kiersten P, DO  propranolol ER (INDERAL LA) 80 MG 24 hr capsule Take 1 capsule (80 mg total) by mouth daily. 10/23/20  Yes Mullis, Kiersten P, DO  bismuth subsalicylate (PEPTO BISMOL) 262 MG/15ML suspension Take 30 mLs by mouth every 6 (six) hours as needed for indigestion or diarrhea or loose stools.    [provider]  cetirizine (ZYRTEC) 10 MG tablet Take 1 tablet (10 mg total) by mouth daily. Patient taking differently: Take 10 mg by mouth daily as needed for allergies. 10/29/20   Zenia Resides, MD  Continuous Blood Gluc Receiver (DEXCOM G6 RECEIVER) DEVI 1 Device by Does not apply route 4 (four) times daily. 03/15/20   Mullis, Kiersten P, DO  Continuous Blood Gluc Transmit (DEXCOM G6 TRANSMITTER) MISC 4 (four) times daily. as  directed 09/09/20   [provider]  cyclobenzaprine (FLEXERIL) 5 MG tablet Take 1 tablet (5 mg total) by mouth 2 (two) times daily as needed for muscle spasms. Patient not taking: No sig reported 11/18/20   Levin Erp, PA  EASY TOUCH PEN NEEDLES 31G X 5 MM MISC Inject 1 application as directed 3 (three) times daily. 10/27/19   Mullis, Kiersten P, DO  FREESTYLE LITE test strip USE AS INSTRUCTED 05/14/20   Mina Marble P, DO  glucose blood test strip USE AS INSTRUCTED 05/15/20   [provider]  glucose blood test strip USE AS INSTRUCTED 09/09/20   [provider]  Lancets (FREESTYLE) lancets Use as instructed 03/29/18   Harriet Butte, DO  oxyCODONE-acetaminophen (PERCOCET) 10-325 MG tablet Take 1 tablet by mouth every 6 (six) hours as needed for up to 15 days for pain (for breakthrough pain). Patient not taking: No sig reported 11/18/20 12/03/20  Levin Erp, PA  sucralfate (CARAFATE) 1 GM/10ML suspension Take 10 mLs (1 g total) by mouth 4 (four) times daily. Patient not taking: No sig reported 11/07/20    Danna Hefty, DO     Family History  Problem Relation Age of Onset  . Diabetes Mother   . Hypertension Mother   . Hyperlipidemia Mother   . Kidney disease Mother   . Thyroid disease Mother   . Breast cancer Mother        mets  . Lung cancer Mother   . Diabetes Father   . Alcohol abuse Sister   . Sickle cell trait Sister   . Diabetes Brother   . Asthma Brother   . Lung cancer Maternal Grandmother   . Kidney disease Maternal Grandmother   . Liver cancer Maternal Uncle        x 4-5  . Kidney disease Maternal Uncle   . Kidney disease Paternal Uncle   . Colon cancer Neg Hx   . Colon polyps Neg Hx   . Esophageal cancer Neg Hx   . Rectal cancer Neg Hx   . Stomach cancer Neg Hx     Social History   Socioeconomic History  . Marital status: Married    Spouse name: [name]  . Number of children: 1  . Years of education: Not on file  . Highest education level: Not on file  Occupational History  . Occupation: unemployed  Tobacco Use  . Smoking status: Current Every Day Smoker    Packs/day: 1.00    Years: 38.00    Pack years: 38.00    Types: Cigarettes    Start date: 09/14/1982  . Smokeless tobacco: Never Used  . Tobacco comment: tobacco info given to patient  Vaping Use  . Vaping Use: Never used  Substance and Sexual Activity  . Alcohol use: Not Currently    Comment: 2-3 per day, none since 2019  . Drug use: Not Currently  . Sexual activity: Not on file  Other Topics Concern  . Not on file  Social History Narrative   One child, 4 step-kids   Social Determinants of Health   Financial Resource Strain: Not on file  Food Insecurity: Not on file  Transportation Needs: Not on file  Physical Activity: Not on file  Stress: Not on file  Social Connections: Not on file     Review of Systems: A 12 point ROS discussed and pertinent positives are indicated in the HPI above.  All other systems are negative.  Review of Systems  Constitutional: Positive for activity  change, appetite change and fatigue. Negative for fever.  Respiratory: Negative for cough and shortness of breath.   Cardiovascular: Negative for chest pain.  Gastrointestinal: Positive for nausea. Negative for abdominal pain.  Musculoskeletal: Positive for back pain.  Psychiatric/Behavioral: Negative for behavioral problems and confusion.    Vital Signs: BP 124/87 (BP Location: Right Arm)   Pulse 66   Temp 97.9 F (36.6 C) (Oral)   Resp 18   SpO2 98%   Physical Exam Vitals reviewed.  HENT:     Mouth/Throat:     Mouth: Mucous membranes are moist.  Cardiovascular:     Rate and Rhythm: Normal rate and regular rhythm.     Heart sounds: Normal heart sounds.  Pulmonary:     Effort: Pulmonary effort is normal.     Breath sounds: Normal breath sounds.  Abdominal:     Palpations: Abdomen is soft.  Musculoskeletal:        General: Normal range of motion.     Comments: Right foot surgery 09/06/20 Left foot infection Back infection Thoracic 9-10  Skin:    General: Skin is warm.  Neurological:     Mental Status: He is alert and oriented to person, place, and time.  Psychiatric:        Behavior: Behavior normal.     Imaging: MR THORACIC SPINE W WO CONTRAST  Result Date: 11/30/2020 CLINICAL DATA:  50 year old male with history of chronic pancreatitis, abnormal T9 and T10 vertebra on recent abdominal MRI suspicious for discitis osteomyelitis. Lower extremity wound. Blood cultures are pending. EXAM: MRI THORACIC WITHOUT AND WITH CONTRAST TECHNIQUE: Multiplanar and multiecho pulse sequences of the thoracic spine were obtained without and with intravenous contrast. CONTRAST:  85mL GADAVIST GADOBUTROL 1 MMOL/ML IV SOLN COMPARISON:  Abdomen MRI 11/28/2020. FINDINGS: Limited cervical spine imaging: Evidence of disc and endplate degeneration at C5-C6 and C6-C7. Thoracic spine segmentation:  Appears to be normal. Alignment:  Preserved thoracic kyphosis.  No spondylolisthesis. Vertebrae:  Confluent marrow edema throughout the T9 and T10 vertebral bodies with associated endplate erosions (more pronounced at the T10 superior endplate. Abnormal heterogeneous intervertebral disc signal with some intervertebral disc enhancement. Associated paraspinal and epidural space inflammation eccentric to the right, and involving the bilateral T9 and T10 neural foramina greater on the right. Ventral more so than dorsal dural thickening and enhancement (series 65, image 8) which tracks up to the T8 and down to the T12 levels. Marrow edema and enhancement also in the posterior right 10th rib, and right T10 posterior elements including the midline spinous process. No epidural or paraspinal abscess at this time. Cord: Normal despite the T9-T10 abnormality. Capacious underlying spinal canal. No abnormal intradural enhancement. Conus medullaris only partially visible at T12-L1. Paraspinal and other soft tissues: Trace bilateral layering pleural fluid but otherwise negative visible thoracic viscera. Negative thoracic paraspinal soft tissues outside of the T9 and T10 level. Disc levels: Normal intervertebral disc spaces T1-T2 through T8-T9. T9-T10 detailed above. T10-T11 through T12-L1 discs remain normal. There is mild lower thoracic facet hypertrophy. But no spinal or foraminal stenosis. No age advanced thoracic spine degeneration. IMPRESSION: 1. Positive for T9-T10 Discitis Osteomyelitis. Marrow edema also involving the posterior right 10th rib, T10 posterior elements. Epidural and paraspinal soft tissue inflammation eccentric to the right. Associated dural thickening and enhancement. But no abscess at this time. 2. No thoracic spinal stenosis. No age advanced degenerative changes in the thoracic spine. Electronically Signed   By: Herminio Heads.D.  On: 11/30/2020 11:27   MR FOOT LEFT W WO CONTRAST  Result Date: 11/30/2020 CLINICAL DATA:  Skin wound on the left second toe with pain and swelling in a diabetic patient.  EXAM: MRI OF THE LEFT FOREFOOT WITHOUT AND WITH CONTRAST TECHNIQUE: Multiplanar, multisequence MR imaging of the left forefoot was performed both before and after administration of intravenous contrast. CONTRAST:  10 mL GADAVIST IV SOLN COMPARISON:  Plain films left foot and MRI left foot 09/04/2020. FINDINGS: Bones/Joint/Cartilage The patient is status post amputation of the great toe since the prior study. There is edema and enhancement in the head and neck of the first metatarsal and in both sesamoid bones of the first MTP joint. Also seen is intense edema and enhancement in the distal phalanx of the second toe. Milder edema and enhancement are seen in the proximal and middle phalanges of the second toe and in the plantar surface of the head of the second metatarsal. A cyst in the central and distal medial cuneiform and small cyst and patchy edema in the middle cuneiform are unchanged since the prior MRI. These may be degenerative. Ligaments Appear intact. Muscles and Tendons There is some atrophy of intrinsic musculature the foot. Refer tract and flexor hallucis longus tendon related to great toe amputation is incidentally noted. Soft tissues No abscess is identified. There is subcutaneous edema and enhancement at the stump of the great toe and about the second toe. A skin wound is seen at the tip of the great toe with an underlying abscess measuring 0.7 cm in diameter. IMPRESSION: Findings consistent with osteomyelitis throughout the distal phalanx of the second toe. Milder degree of edema and enhancement in the proximal and middle phalanges of the second toe and plantar surface of the head of the second metatarsal could be reactive but are worrisome for osteomyelitis. Skin wound at the tip of the second toe with a subcentimeter underlying abscess. Status post amputation of the great toe. Edema and enhancement in the head and neck of the first metatarsal and medial and lateral sesamoids are most worrisome for  osteomyelitis given overlying intense subcutaneous edema and enhancement. Cellulitis about the patient's stump and second toe. Electronically Signed   By: Inge Rise M.D.   On: 11/30/2020 11:25   MR 3D Recon At Scanner  Addendum Date: 11/29/2020   ADDENDUM REPORT: 11/29/2020 10:23 ADDENDUM: Critical Value/emergent results were called by telephone at the time of interpretation on 11/29/2020 at 10:23 am to provider [facility] , who verbally acknowledged these results. Electronically Signed   By: Kerby Moors M.D.   On: 11/29/2020 10:23   Result Date: 11/29/2020 CLINICAL DATA:  Chronic pancreatitis. Evaluate for pancreatic duct stricture or stone. Right flank pain for 2 weeks EXAM: MRI ABDOMEN WITHOUT AND WITH CONTRAST (INCLUDING MRCP) TECHNIQUE: Multiplanar multisequence MR imaging of the abdomen was performed both before and after the administration of intravenous contrast. Heavily T2-weighted images of the biliary and pancreatic ducts were obtained, and three-dimensional MRCP images were rendered by post processing. CONTRAST:  7mL GADAVIST GADOBUTROL 1 MMOL/ML IV SOLN COMPARISON:  10/23/2020 FINDINGS: Lower chest: No acute findings. Hepatobiliary: No mass or other parenchymal abnormality identified. Signs of previous cholecystectomy. Mild intrahepatic bile duct dilatation. The common bile duct measures 1.4 cm in maximum diameter. Areas of concentric narrowing at the level of the head of pancreas are identified consistent with changes secondary to chronic inflammation. On the coronal T2 weighted images there are 2 small hypointense filling defects within the mid  and distal CBD which measure up to 3 mm. Although a corresponding finding is not confidently noted on other sequences, cannot rule out choledocholithiasis. Pancreas: Changes due to chronic pancreatitis are again noted. There are multiple areas of parenchymal atrophy throughout the pancreas. The main pancreatic duct is irregular with  multiple areas of focal luminal narrowing. At the level of the neck of pancreas the main duct measures up to 5 mm. Abrupt narrowing of the main duct is noted within the head of pancreas just proximal to the ampulla, image 24/5. Findings likely reflect postinflammatory stricturing due to chronic pancreatitis. No signs of acute inflammation or mass. Spleen: Atrophic spleen appears small and lobular. Adjacent small splenules noted. Adrenals/Urinary Tract: Normal adrenal glands. No kidney mass or hydronephrosis. Stomach/Bowel: Stomach appears normal. No signs of bowel wall thickening, inflammation or distension. Vascular/Lymphatic: Normal caliber of the abdominal aorta. No signs of abdominal aortic aneurysm. Chronic occlusion of the splenic vein. Portal vein is patent. No abdominopelvic adenopathy. Other:  No free fluid or fluid collections. Musculoskeletal: On the T2 weighted coronal images there is abnormal increased T2 signal and superior endplate deformity involving the T10 vertebra. On the coronal postcontrast images there is diffuse abnormal enhancement within the T9 and T10 vertebra, image 58/3. There is mild loss of the T9-10 disc space. There is diffuse edema and abnormal enhancement within the paraspinal soft tissues at this level as seen on the coronal postcontrast images, image 58/30. IMPRESSION: 1. Changes due to chronic pancreatitis. 2. There are 2 small hypointense filling defects within the mid and distal CBD which measure up to 3 mm. Although a corresponding finding is not confidently noted on other T2 weighted sequences, choledocholithiasis cannot be excluded. 3. Mild intrahepatic and extrahepatic bile duct dilatation. Areas of concentric narrowing of the common bile duct at the level of the head of pancreas are identified which are likely secondary to chronic inflammation due to pancreatitis 4. Acute changes involving the T9 and T10 vertebra are concerning for discitis/osteomyelitis. Further  investigation with contrast enhanced MRI of the thoracic spine is advised. Electronically Signed: By: Kerby Moors M.D. On: 11/29/2020 09:44   MR ABDOMEN MRCP W WO CONTAST  Addendum Date: 11/29/2020   ADDENDUM REPORT: 11/29/2020 10:23 ADDENDUM: Critical Value/emergent results were called by telephone at the time of interpretation on 11/29/2020 at 10:23 am to provider [facility] , who verbally acknowledged these results. Electronically Signed   By: Kerby Moors M.D.   On: 11/29/2020 10:23   Result Date: 11/29/2020 CLINICAL DATA:  Chronic pancreatitis. Evaluate for pancreatic duct stricture or stone. Right flank pain for 2 weeks EXAM: MRI ABDOMEN WITHOUT AND WITH CONTRAST (INCLUDING MRCP) TECHNIQUE: Multiplanar multisequence MR imaging of the abdomen was performed both before and after the administration of intravenous contrast. Heavily T2-weighted images of the biliary and pancreatic ducts were obtained, and three-dimensional MRCP images were rendered by post processing. CONTRAST:  60mL GADAVIST GADOBUTROL 1 MMOL/ML IV SOLN COMPARISON:  10/23/2020 FINDINGS: Lower chest: No acute findings. Hepatobiliary: No mass or other parenchymal abnormality identified. Signs of previous cholecystectomy. Mild intrahepatic bile duct dilatation. The common bile duct measures 1.4 cm in maximum diameter. Areas of concentric narrowing at the level of the head of pancreas are identified consistent with changes secondary to chronic inflammation. On the coronal T2 weighted images there are 2 small hypointense filling defects within the mid and distal CBD which measure up to 3 mm. Although a corresponding finding is not confidently noted on other sequences, cannot  rule out choledocholithiasis. Pancreas: Changes due to chronic pancreatitis are again noted. There are multiple areas of parenchymal atrophy throughout the pancreas. The main pancreatic duct is irregular with multiple areas of focal luminal narrowing. At the level of  the neck of pancreas the main duct measures up to 5 mm. Abrupt narrowing of the main duct is noted within the head of pancreas just proximal to the ampulla, image 24/5. Findings likely reflect postinflammatory stricturing due to chronic pancreatitis. No signs of acute inflammation or mass. Spleen: Atrophic spleen appears small and lobular. Adjacent small splenules noted. Adrenals/Urinary Tract: Normal adrenal glands. No kidney mass or hydronephrosis. Stomach/Bowel: Stomach appears normal. No signs of bowel wall thickening, inflammation or distension. Vascular/Lymphatic: Normal caliber of the abdominal aorta. No signs of abdominal aortic aneurysm. Chronic occlusion of the splenic vein. Portal vein is patent. No abdominopelvic adenopathy. Other:  No free fluid or fluid collections. Musculoskeletal: On the T2 weighted coronal images there is abnormal increased T2 signal and superior endplate deformity involving the T10 vertebra. On the coronal postcontrast images there is diffuse abnormal enhancement within the T9 and T10 vertebra, image 58/3. There is mild loss of the T9-10 disc space. There is diffuse edema and abnormal enhancement within the paraspinal soft tissues at this level as seen on the coronal postcontrast images, image 58/30. IMPRESSION: 1. Changes due to chronic pancreatitis. 2. There are 2 small hypointense filling defects within the mid and distal CBD which measure up to 3 mm. Although a corresponding finding is not confidently noted on other T2 weighted sequences, choledocholithiasis cannot be excluded. 3. Mild intrahepatic and extrahepatic bile duct dilatation. Areas of concentric narrowing of the common bile duct at the level of the head of pancreas are identified which are likely secondary to chronic inflammation due to pancreatitis 4. Acute changes involving the T9 and T10 vertebra are concerning for discitis/osteomyelitis. Further investigation with contrast enhanced MRI of the thoracic spine is  advised. Electronically Signed: By: Kerby Moors M.D. On: 11/29/2020 09:44   ECHOCARDIOGRAM COMPLETE  Result Date: 12/01/2020    ECHOCARDIOGRAM REPORT   Patient Name:   Christopher Fernandez Date of Exam: 12/01/2020 Medical Rec #:  [id number]      Height:       75.0 in Accession #:    [id number]     Weight:       249.0 lb Date of Birth:  [birth year]       BSA:          2.409 m Patient Age:    9 years       BP:           115/79 mmHg Patient Gender: M              HR:           83 bpm. Exam Location:  Inpatient Procedure: 2D Echo, Cardiac Doppler and Color Doppler Indications:    Bacteremia  History:        Patient has no prior history of Echocardiogram examinations.                 COPD; Risk Factors:Current Smoker and Diabetes. GERD. ETOH                 abuse.  Sonographer:    Clayton Lefort RDCS (AE) Referring Phys: [id number] [facility] T VU  Sonographer Comments: Technically difficult study due to poor echo windows, suboptimal parasternal window, suboptimal apical window and suboptimal subcostal window. Image acquisition  challenging due to COPD and Image acquisition challenging due to respiratory motion. Limited patient mobility. IMPRESSIONS  1. Left ventricular ejection fraction, by estimation, is 60 to 65%. The left ventricle has normal function. The left ventricle has no regional wall motion abnormalities. Left ventricular diastolic parameters were normal.  2. Right ventricular systolic function is normal. The right ventricular size is normal.  3. The pericardial effusion is anterior to the right ventricle.  4. The mitral valve was not well visualized. No evidence of mitral valve regurgitation.  5. The aortic valve was not well visualized. Aortic valve regurgitation is not visualized. Aortic valve area, by VTI measures 4.36 cm. Aortic valve mean gradient measures 3.0 mmHg. Aortic valve Vmax measures 1.19 m/s.  6. Aortic dilatation noted. There is borderline dilatation of the aortic root, measuring 37 mm.  7. The inferior  vena cava is normal in size with greater than 50% respiratory variability, suggesting right atrial pressure of 3 mmHg.  8. Poor image quality precludes adequate assessment of valves. Cannot rule out vegetation on mitral valve in the apical 2 chamber view. The AV cannot be assessed. Suggest TEE for further evaulation. FINDINGS  Left Ventricle: Left ventricular ejection fraction, by estimation, is 60 to 65%. The left ventricle has normal function. The left ventricle has no regional wall motion abnormalities. The left ventricular internal cavity size was normal in size. There is  no left ventricular hypertrophy. Left ventricular diastolic parameters were normal. Normal left ventricular filling pressure. Right Ventricle: The right ventricular size is normal. No increase in right ventricular wall thickness. Right ventricular systolic function is normal. Left Atrium: Left atrial size was normal in size. Right Atrium: Right atrial size was normal in size. Pericardium: Trivial pericardial effusion is present. The pericardial effusion is anterior to the right ventricle. Mitral Valve: The mitral valve was not well visualized. Mild mitral annular calcification. No evidence of mitral valve regurgitation. Tricuspid Valve: The tricuspid valve is normal in structure. Tricuspid valve regurgitation is trivial. No evidence of tricuspid stenosis. Aortic Valve: The aortic valve was not well visualized. Aortic valve regurgitation is not visualized. Aortic valve mean gradient measures 3.0 mmHg. Aortic valve peak gradient measures 5.7 mmHg. Aortic valve area, by VTI measures 4.36 cm. Pulmonic Valve: The pulmonic valve was not well visualized. Pulmonic valve regurgitation is not visualized. Aorta: The aortic root is normal in size and structure and aortic dilatation noted. There is borderline dilatation of the aortic root, measuring 37 mm. Venous: The inferior vena cava is normal in size with greater than 50% respiratory variability,  suggesting right atrial pressure of 3 mmHg. IAS/Shunts: No atrial level shunt detected by color flow Doppler.  LEFT VENTRICLE PLAX 2D LVIDd:         4.30 cm  Diastology LVIDs:         3.10 cm  LV e' medial:    12.30 cm/s LV PW:         1.60 cm  LV E/e' medial:  5.7 LV IVS:        1.00 cm  LV e' lateral:   9.57 cm/s LVOT diam:     2.40 cm  LV E/e' lateral: 7.4 LV SV:         84 LV SV Index:   35 LVOT Area:     4.52 cm  RIGHT VENTRICLE             IVC RV S prime:     12.30 cm/s  IVC diam: 2.30 cm  TAPSE (M-mode): 2.9 cm LEFT ATRIUM           Index       RIGHT ATRIUM           Index LA diam:      4.20 cm 1.74 cm/m  RA Area:     21.00 cm LA Vol (A4C): 43.8 ml 18.19 ml/m RA Volume:   63.80 ml  26.49 ml/m  AORTIC VALVE AV Area (Vmax):    3.99 cm AV Area (Vmean):   4.31 cm AV Area (VTI):     4.36 cm AV Vmax:           119.00 cm/s AV Vmean:          74.800 cm/s AV VTI:            0.192 m AV Peak Grad:      5.7 mmHg AV Mean Grad:      3.0 mmHg LVOT Vmax:         105.00 cm/s LVOT Vmean:        71.300 cm/s LVOT VTI:          0.185 m LVOT/AV VTI ratio: 0.96  AORTA Ao Root diam: 3.70 cm MITRAL VALVE MV Area (PHT): 3.65 cm    SHUNTS MV Decel Time: 208 msec    Systemic VTI:  0.18 m MV E velocity: 70.70 cm/s  Systemic Diam: 2.40 cm MV A velocity: 56.40 cm/s MV E/A ratio:  1.25 Fransico Him MD Electronically signed by Fransico Him MD Signature Date/Time: 12/01/2020/4:45:26 PM    Final     Labs:  CBC: Recent Labs    11/29/20 1814 11/30/20 0600 12/01/20 0112 12/02/20 0302  WBC 8.5 10.7* 10.6* 9.3  HGB 11.3* 11.1* 10.7* 10.7*  HCT 34.5* 32.9* 31.4* 32.9*  PLT 482* 474* 478* 474*    COAGS: Recent Labs    01/10/20 1053 12/02/20 0302  INR 1.0 1.0  APTT 33  --     BMP: Recent Labs    03/12/20 1508 09/04/20 1242 10/23/20 1016 10/29/20 1130 11/08/20 1009 11/13/20 1033 11/29/20 1814 11/30/20 0600 12/01/20 0112 12/02/20 0302  NA 139   < > 131* 138 142   < > 131* 135 131* 132*  K 4.6   < > 5.0  6.1* 6.1*   < > 3.8 4.0 3.7 4.3  CL 99   < > 98 100 103   < > 101 101 98 100  CO2 22   < > 20 21 18*   < > 21* 25 23 25   GLUCOSE 128*   < > 196* 117* 168*   < > 114* 136* 153* 289*  BUN 8   < > 14 13 16    < > 13 10 12 15   CALCIUM 9.4   < > 9.6 9.6 9.9   < > 8.4* 8.5* 8.4* 8.6*  CREATININE 0.87   < > 0.80 0.96 0.91   < > 1.13 0.99 0.93 0.99  GFRNONAA 100   < > 103 91 97  --  >60 >60 >60 >60  GFRAA 116  --  120 105 112  --   --   --   --   --    < > = values in this interval not displayed.    LIVER FUNCTION TESTS: Recent Labs    10/23/20 1016 11/08/20 1009 11/13/20 1033 11/29/20 1814  BILITOT 0.4 0.4 0.5 0.7  AST 13 24 13 18   ALT 33 42 29 15  ALKPHOS 112 143* 135* 90  PROT 6.8 7.8 7.2 6.5  ALBUMIN 4.5 4.5 4.2 2.6*    TUMOR MARKERS: No results for input(s): AFPTM, CEA, CA199, CHROMGRNA in the last 8760 hours.  Assessment and Plan:  Back pain Discitis on MRI Scheduled for Thoracic 9-10 disc aspiration Risks and benefits of Thoracic 9-10 disc aspiration was discussed with the patient and/or patient's family including, but not limited to bleeding, infection, damage to adjacent structures or low yield requiring additional tests.  All of the questions were answered and there is agreement to proceed. Consent signed and in chart.   Thank you for this interesting consult.  I greatly enjoyed meeting Christopher Fernandez and look forward to participating in their care.  A copy of this report was sent to the requesting provider on this date.  Electronically Signed: Lavonia Drafts, PA-C 12/02/2020, 10:04 AM   I spent a total of 20 Minutes    in face to face in clinical consultation, greater than 50% of which was counseling/coordinating care for T 9-10 disc aspiration

## 2020-12-02 NOTE — Progress Notes (Signed)
Orthopedic Tech Progress Note Patient Details:  Christopher Fernandez [birth year] [id number]  Ortho Devices Type of Ortho Device: Darco shoe Ortho Device/Splint Location: LLE Ortho Device/Splint Interventions: Ordered,Application   Post Interventions Patient Tolerated: Well Instructions Provided: [facility] [place] 12/02/2020, 6:56 PM

## 2020-12-02 NOTE — Transfer of Care (Signed)
Immediate Anesthesia Transfer of Care Note  Patient: Christopher Fernandez  Procedure(s) Performed: AMPUTATION 2ND TOE (Left Foot)  Patient Location: PACU  Anesthesia Type:MAC combined with regional for post-op pain  Level of Consciousness: awake, alert  and patient cooperative  Airway & Oxygen Therapy: Patient Spontanous Breathing  Post-op Assessment: Report given to RN and Post -op Vital signs reviewed and stable  Post vital signs: Reviewed and stable  Last Vitals:  Vitals Value Taken Time  BP 137/92 12/02/20 1645  Temp    Pulse 74 12/02/20 1650  Resp 14 12/02/20 1650  SpO2 96 % 12/02/20 1650  Vitals shown include unvalidated device data.  Last Pain:  Vitals:   12/02/20 1100  TempSrc:   PainSc: 3       Patients Stated Pain Goal: 3 (85/50/15 8682)  Complications: No complications documented.

## 2020-12-02 NOTE — Progress Notes (Signed)
Family Medicine Teaching Service Daily Progress Note Intern Pager: [phone]  Patient name: Christopher Fernandez Medical record number: [id number] Date of birth: 10-Nov-1970 Age: 50 y.o. 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 PMH is significant forcellulitis, diabetes, neuropathy, anemia, hyperlipidemia, asthma, alcohol abuse, tobacco use disorder, trauma, cholecystectomy, asplenia, anxietyanddepression.  MRSA bacteremiaOsteomyelitis of T9-T10 S/p cefepime and flagyl x2 days. Patient needs TEE, we will contact cardiology today to shcedule -ID following, appreciate recommendations - Continue Vanc - F/u blood and urine culture - Pain control: toradol30mg  IV q8h PRN, oxycodone 10mg  IR POQ4PRN, tylenol 1000 mgQ6PRN, gabapentin 600 mg TID, lidocaine patchQ24H - Protonix 40mg  daily for GI protection - PT/OT - SCDs for DVT ppx - NPO MN  Left foot, second digit osteomyelitis  POD#1 s/p left 2nd toe amputation S/p amputation of 2nd toe with resection of metatarsal head on 3/21. Patient reports no current pain.  - Wound care consult placed - VVSfollowing, appreciate recommendations- - Continue Vanc  T2DM with neuropathy: Chronic, stable CBGs 133-276, Patient received 5U Novolog. Will continue to trend CBGs and add long-acting insulin as appropriate. Holding home medications (jardiance, linagliptin, metformin) while monitoring CBGs - CBG before meals and nightly - sSSI - Continue home gabapentin  Esophageal candidiasis Found by EGD on 3/09. - Cont. fluconazole 200mg  daily for the next week - Hold atorvastatin  - Monitor  sx  Pancreatic insufficiencychronic pancreatitis - Continue Creon 36, 000 units with meals . 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 - Continue propranolol 80 mg daily  Hx Asplenia, Cholecystectomy: chronic, stable -Consider Meningococal vaccine as outpatient  Anxiety with Depression: Chronic, stable Home meds:aripiprazole 5 mg daily, Cymbalta 60 mg, mirtazapine 30 mg. - Continue home medications   Weight loss Recent 12 lb weight loss and night sweats. -Continue to monitor   FEN/GI:carb modified diet, protonix GXQ:JJHE   Status is: Inpatient  Remains inpatient appropriate because:IV treatments appropriate due to intensity of illness or inability to take PO   Dispo: The patient is from: Home              Anticipated d/c is to: Home              Patient currently is not medically stable to d/c.   Difficult to place patient No    Subjective:  Patient reports that he feels okay today. He does note some abdominal pain but reports that it is his "regular pancreatic pain" that comes and goes. No other acute issues at this time  Objective: Temp:  [97.7 F (36.5 C)-98.3 F (36.8 C)] 98.3 F (36.8 C) (03/22 1209) Pulse Rate:  [57-81] 76 (03/22 1209) Resp:  [11-18] 15 (03/22 1209) BP: (124-149)/(74-96) 134/82 (03/22 1209) SpO2:  [96 %-99 %] 98 % (03/22 1209) Weight:  [115.3 kg] 115.3 kg (03/21 1744) Physical Exam: General: NAD, sitting up in bed, overall well appearing Cardiovascular: RRR, no m/r/g appreciated Respiratory:  comfortable on room air, no increased WOB, minimal expiratory wheeze in LUL  Abdomen: soft, non-tender to  palpation, non-distended Extremities: s/p amputation of left 2nd metatarsal with dressing covering that is C/D/I. Amputation of all digits on right foot noted  Laboratory: Recent Labs  Lab 12/01/20 0112 12/02/20 0302 12/03/20 0833  WBC 10.6* 9.3 8.2  HGB 10.7* 10.7* 12.0*  HCT 31.4* 32.9* 36.6*  PLT 478* 474* 459*   Recent Labs  Lab 11/29/20 1814 11/30/20 0600 12/01/20 0112 12/02/20 0302 12/03/20 0833  NA 131*   < > 131* 132* 136  K 3.8   < > 3.7 4.3 4.2  CL 101   < > 98 100 103  CO2 21*   < > 23 25 26   BUN 13   < > 12 15 12   CREATININE 1.13   < > 0.93 0.99 0.83  CALCIUM 8.4*   < > 8.4* 8.6* 9.0  PROT 6.5  --   --   --   --   BILITOT 0.7  --   --   --   --   ALKPHOS 90  --   --   --   --   ALT 15  --   --   --   --   AST 18  --   --   --   --   GLUCOSE 114*   < > 153* 289* 177*   < > = values in this interval not displayed.     Imaging/Diagnostic Tests: No results found.   [name], DO 12/03/2020, 12:18 PM PGY-1, [place] Intern pager: [phone], text pages welcome

## 2020-12-02 NOTE — Progress Notes (Signed)
Blood cultures show MRSA bacteremia on Sunday, 3/20. Patient initially admitted for osteomyelitis of T9-T10 and IR consulted for disc aspiration on Friday, 3/18. No longer needs disc aspiration. Notified RN and Dr. Anselm Pancoast, [place].  Gladys Damme, MD [place], PGY-2

## 2020-12-02 NOTE — Op Note (Signed)
Procedure: Amputation left second toe with resection of metatarsal head  Preoperative diagnosis: Abscess left second toe with osteomyelitis  Postoperative diagnosis: Same  Anesthesia: Popliteal block  Operative findings: Pulsatile digital vessel bleeding  Operative details: After obtaining form consent, the patient was taken to the operating room.  The patient was placed in supine position operating table.  A popliteal block had been placed by the anesthesia team.  The block was tested.  Circumferential incision was made at the base of the left second toe carried down through the subcutaneous tissues down the level of the bone.  The bone was then transected with a bone cutter.  The proximal phalanx was removed with rongeurs.  The metatarsal head was resected with rongeurs.  Digital vessel bleeding was pulsatile and this was controlled with cautery.  The tendons were trimmed back past the level of bone.  Wound was thoroughly irrigated with normal saline solution.  Skin was then loosely approximated with interrupted 3 0 vertical mattress nylon sutures in simple sutures.  Patient tolerated the procedure well and there were no complications.  Instrument sponge and needle counts were correct at the end of the case.  Dry sterile dressing was applied.  Patient tolerated the procedure well and there were no complications.  Instrument sponge and needle counts were correct at the end of the case.  Patient was taken to recovery in stable condition.  Ruta Hinds, MD [facility] Office: [phone]

## 2020-12-02 NOTE — Plan of Care (Signed)
  Problem: Activity: Goal: Risk for activity intolerance will decrease Outcome: Progressing   Problem: Pain Managment: Goal: General experience of comfort will improve Outcome: Not Progressing

## 2020-12-02 NOTE — Progress Notes (Signed)
PT Cancellation Note  Patient Details Name: Christopher Fernandez MRN: [id number] DOB: 18-Mar-1971   Cancelled Treatment:    Reason Eval/Treat Not Completed: (P) Patient at procedure or test/unavailable (Pt off unit to OR for amputation of toe. Will f/u per POC.)   Amira Podolak Eli Hose 12/02/2020, 1:39 PM  Erasmo Leventhal , PTA Acute Rehabilitation Services Pager [phone] Office [phone]

## 2020-12-02 NOTE — Interval H&P Note (Signed)
History and Physical Interval Note:  12/02/2020 2:56 PM  Christopher Fernandez  has presented today for surgery, with the diagnosis of LEFT 2ND TOE GANGRENE.  The various methods of treatment have been discussed with the patient and family. After consideration of risks, benefits and other options for treatment, the patient has consented to  Procedure(s): AMPUTATION 2ND TOE (Left) as a surgical intervention.  The patient's history has been reviewed, patient examined, no change in status, stable for surgery.  I have reviewed the patient's chart and labs.  Questions were answered to the patient's satisfaction.    Although there are some areas of abnormality and edema of the right first toe amp site there are no pointing signs of infection externally.  He is going to receive IV antibiotics for his neck and this should cover his left first toe as well.  Would go back and revise amputation higher only in the event he has worsening clinical appearance.  D/w pt. Will do left 2nd toe amp today   Ruta Hinds

## 2020-12-03 ENCOUNTER — Encounter (HOSPITAL_COMMUNITY): Payer: Self-pay | Admitting: Vascular Surgery

## 2020-12-03 DIAGNOSIS — E11628 Type 2 diabetes mellitus with other skin complications: Secondary | ICD-10-CM | POA: Diagnosis not present

## 2020-12-03 DIAGNOSIS — I3139 Other pericardial effusion (noninflammatory): Secondary | ICD-10-CM | POA: Diagnosis present

## 2020-12-03 DIAGNOSIS — M8619 Other acute osteomyelitis, multiple sites: Secondary | ICD-10-CM | POA: Diagnosis not present

## 2020-12-03 DIAGNOSIS — M60003 Infective myositis, unspecified right leg: Secondary | ICD-10-CM | POA: Diagnosis present

## 2020-12-03 DIAGNOSIS — R7881 Bacteremia: Secondary | ICD-10-CM | POA: Diagnosis not present

## 2020-12-03 DIAGNOSIS — M86151 Other acute osteomyelitis, right femur: Secondary | ICD-10-CM | POA: Diagnosis present

## 2020-12-03 DIAGNOSIS — R1013 Epigastric pain: Secondary | ICD-10-CM | POA: Diagnosis not present

## 2020-12-03 DIAGNOSIS — B370 Candidal stomatitis: Secondary | ICD-10-CM | POA: Diagnosis present

## 2020-12-03 DIAGNOSIS — I313 Pericardial effusion (noninflammatory): Secondary | ICD-10-CM

## 2020-12-03 DIAGNOSIS — M4645 Discitis, unspecified, thoracolumbar region: Secondary | ICD-10-CM | POA: Diagnosis present

## 2020-12-03 DIAGNOSIS — M25551 Pain in right hip: Secondary | ICD-10-CM | POA: Diagnosis not present

## 2020-12-03 LAB — CBC
HCT: 36.6 % — ABNORMAL LOW (ref 39.0–52.0)
Hemoglobin: 12 g/dL — ABNORMAL LOW (ref 13.0–17.0)
MCH: 28.5 pg (ref 26.0–34.0)
MCHC: 32.8 g/dL (ref 30.0–36.0)
MCV: 86.9 fL (ref 80.0–100.0)
Platelets: 459 K/uL — ABNORMAL HIGH (ref 150–400)
RBC: 4.21 MIL/uL — ABNORMAL LOW (ref 4.22–5.81)
RDW: 14 % (ref 11.5–15.5)
WBC: 8.2 K/uL (ref 4.0–10.5)
nRBC: 0 % (ref 0.0–0.2)

## 2020-12-03 LAB — BASIC METABOLIC PANEL WITH GFR
Anion gap: 7 (ref 5–15)
BUN: 12 mg/dL (ref 6–20)
CO2: 26 mmol/L (ref 22–32)
Calcium: 9 mg/dL (ref 8.9–10.3)
Chloride: 103 mmol/L (ref 98–111)
Creatinine, Ser: 0.83 mg/dL (ref 0.61–1.24)
GFR, Estimated: >60 mL/min
Glucose, Bld: 177 mg/dL — ABNORMAL HIGH (ref 70–99)
Potassium: 4.2 mmol/L (ref 3.5–5.1)
Sodium: 136 mmol/L (ref 135–145)

## 2020-12-03 LAB — GLUCOSE, CAPILLARY
Glucose-Capillary: 177 mg/dL — ABNORMAL HIGH (ref 70–99)
Glucose-Capillary: 284 mg/dL — ABNORMAL HIGH (ref 70–99)
Glucose-Capillary: 277 mg/dL — ABNORMAL HIGH (ref 70–99)
Glucose-Capillary: 176 mg/dL — ABNORMAL HIGH (ref 70–99)

## 2020-12-03 LAB — VANCOMYCIN, PEAK: Vancomycin Pk: 27 ug/mL — ABNORMAL LOW (ref 30–40)

## 2020-12-03 LAB — VANCOMYCIN, TROUGH: Vancomycin Tr: 10 ug/mL — ABNORMAL LOW (ref 15–20)

## 2020-12-03 LAB — CK: Total CK: 42 U/L — ABNORMAL LOW (ref 49–397)

## 2020-12-03 MED ORDER — SODIUM CHLORIDE 0.9 % IV SOLN
1000.0000 mg | Freq: Every day | INTRAVENOUS | Status: DC
  Administered 2020-12-03 – 2020-12-10 (×8): 1000 mg via INTRAVENOUS
  Filled 2020-12-03 (×8): qty 20

## 2020-12-03 MED ORDER — ENOXAPARIN SODIUM 60 MG/0.6ML ~~LOC~~ SOLN
60.0000 mg | SUBCUTANEOUS | Status: AC
  Administered 2020-12-03 – 2020-12-05 (×2): 60 mg via SUBCUTANEOUS
  Filled 2020-12-03 (×2): qty 0.6

## 2020-12-03 NOTE — Progress Notes (Signed)
    [facility] has been requested to perform a transesophageal echocardiogram on 12/04/2019 for bacteremia.  After careful review of history and examination, the risks and benefits of transesophageal echocardiogram have been explained including risks of esophageal damage, perforation (1:10,000 risk), bleeding, pharyngeal hematoma as well as other potential complications associated with conscious sedation including aspiration, arrhythmia, respiratory failure and death. Alternatives to treatment were discussed, questions were answered. Patient is willing to proceed.   50 yo male presented with T9-T10 discitis and osteomyelitis and found to have MRSA.   Almyra Deforest, [place] 12/03/2020 2:51 PM

## 2020-12-03 NOTE — Progress Notes (Signed)
Family Medicine Teaching Service Daily Progress Note Intern Pager: [phone]  Patient name: Christopher Fernandez Medical record number: [id number] Date of birth: [birth year] Age: 50 y.o. Gender: male  Primary Care Provider: Danna Hefty, DO Consultants: ID, Vascular surgery Code Status: Full  Pt Overview and Major Events to Date:  3/18: admitted for c/f thoracic discitis vs osteomyelitis 3/19: MR confirmed T9-T10 osteomyelitis, also left foot 2nd digit osteomyelitis with abscess  Assessment and Plan: Christopher Fernandez a 49 y.o.malepresenting with back pain. PMH is significant forcellulitis, diabetes, neuropathy, anemia, hyperlipidemia, asthma, alcohol abuse, tobacco use disorder, trauma, cholecystectomy, asplenia, anxietyanddepression.  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 S/p cefepime and flagyl x2 days. Patient was transitioned from vanc to daptomycin on 3/22. TEE scheduled for today, will follow-up. -ID following, appreciate recommendations - Vascular surgery consulted - Continue daptomycin - F/u blood and urine culture - Pain control: toradol30mg  IVq8h PRN, oxycodone10mg  IR POQ4PRN, tylenol 1000 mgQ6PRN, gabapentin 600 mg TID, lidocaine patchQ24H - Protonix 40mg  daily for GI protection - PT/OT - NPO MN - Wound care consulted  RUQ pain Patient reports RUQ abdominal pain that initially felt like his chronic pancreatic pain but has changed in quality though he cannot describe the change. Pain is intermittent stabbing in nature, improved with pain medications. Physical exam significant for deep palpation in RUQ, especially when palpation around liver edge. Patient is s/p cholecystectomy, known hx of alcohol use and concern for liver pathology. - Obtain RUQ [place] - CMP to evaluated LFTs  T2DM with neuropathy: Chronic, stable [phone], Patient received 12U Novolog. Holding home medications (jardiance,  linagliptin, metformin) while monitoring CBGs. Patient is currently NPO with plan for TEE today, will evaluate CBGs in the afternoon and likely will add Lantus 5U if tolerating food well and persistently hyperglycemic  - CBG before meals and nightly - sSSI - Continue home gabapentin  Esophageal candidiasis Found by EGD on 3/09. - Cont.fluconazole 200mg  daily for the next week - Hold atorvastatin  - Monitor sx  Pancreatic insufficiencychronic pancreatitis - Continue Creon 36, 000 units with meals . 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 - Continue propranolol 80 mg daily  Hx Asplenia, Cholecystectomy: chronic, stable -Consider Meningococal vaccine as outpatient  Anxiety with Depression: Chronic, stable Home meds:aripiprazole 5 mg daily, Cymbalta 60 mg, mirtazapine 30 mg. - Continue home medications   Weight loss Recent 12 lb weight loss and night sweats. -Continue to monitor   FEN/GI:NPO for TEE, will resume carb modified afterwards, protonix PPx: Lovenox  Status is: Inpatient  Remains inpatient appropriate because:Inpatient level of care appropriate due to severity of illness   Dispo: The patient is from: Home              Anticipated d/c is to: Home              Patient currently is not medically stable to d/c.   Difficult to place patient No     Subjective:  Patient reports change in his abdominal pain with now having intermittent stabbing pain that is more over his RUQ. This is changed from his reported chronic pancreatic pain though he cannot describe exactly what  is different for him, but that it is changed.  Patient states he had a BM yesterday morning. He was also concerned as a CT scan he had in February this year showed a spleen, but patient states he had a splenectomy.  Objective: Temp:  [97.8 F (36.6 C)-98.6 F (37 C)] 97.8 F (36.6 C) (03/23 0822) Pulse Rate:  [61-76] 61 (03/23 0822) Resp:  [15-20] 15 (03/23 0822) BP: (130-157)/(82-104) 130/89 (03/23 0822) SpO2:  [97 %-100 %] 97 % (03/23 6503) Physical Exam: General: NAD, sitting up in chair initially, pleasant and cooperative Cardiovascular: RRR, no m/r/g appreciated Respiratory: comfortable on room air, no increased WOB Abdomen: soft, tenderness to deep palpation of RUQ and liver edge, bowel sounds present. Extremities: moving all equally and appropriately, left foot with ACE wrap covering foot and hospital socks on.   Laboratory: Recent Labs  Lab 12/01/20 0112 12/02/20 0302 12/03/20 0833  WBC 10.6* 9.3 8.2  HGB 10.7* 10.7* 12.0*  HCT 31.4* 32.9* 36.6*  PLT 478* 474* 459*   Recent Labs  Lab 11/29/20 1814 11/30/20 0600 12/01/20 0112 12/02/20 0302 12/03/20 0833  NA 131*   < > 131* 132* 136  K 3.8   < > 3.7 4.3 4.2  CL 101   < > 98 100 103  CO2 21*   < > 23 25 26   BUN 13   < > 12 15 12   CREATININE 1.13   < > 0.93 0.99 0.83  CALCIUM 8.4*   < > 8.4* 8.6* 9.0  PROT 6.5  --   --   --   --   BILITOT 0.7  --   --   --   --   ALKPHOS 90  --   --   --   --   ALT 15  --   --   --   --   AST 18  --   --   --   --   GLUCOSE 114*   < > 153* 289* 177*   < > = values in this interval not displayed.     Imaging/Diagnostic Tests: No results found.   [name], DO 12/04/2020, 8:37 AM PGY-1, [place] Intern pager: [phone], text pages welcome

## 2020-12-03 NOTE — Progress Notes (Addendum)
Subjective:  No new complaints   Antibiotics:  Anti-infectives (From admission, onward)   Start     Dose/Rate Route Frequency Ordered Stop   12/02/20 1630  ceFEPIme (MAXIPIME) 2 g in sodium chloride 0.9 % 100 mL IVPB  Status:  Discontinued        2 g 200 mL/hr over 30 Minutes Intravenous  Once 12/02/20 1609 12/02/20 1850   12/01/20 1845  fluconazole (DIFLUCAN) tablet 200 mg        200 mg Oral Daily 12/01/20 1753 12/08/20 0959   12/01/20 1400  ceFEPIme (MAXIPIME) 2 g in sodium chloride 0.9 % 100 mL IVPB  Status:  Discontinued        2 g 200 mL/hr over 30 Minutes Intravenous Every 8 hours 12/01/20 1007 12/02/20 0910   12/01/20 1045  metroNIDAZOLE (FLAGYL) tablet 500 mg  Status:  Discontinued        500 mg Oral Every 12 hours 12/01/20 0951 12/02/20 0910   12/01/20 0500  vancomycin (VANCOREADY) IVPB 1250 mg/250 mL        1,250 mg 166.7 mL/hr over 90 Minutes Intravenous Every 12 hours 11/30/20 1641     11/30/20 1645  vancomycin (VANCOREADY) IVPB 1500 mg/300 mL        1,500 mg 150 mL/hr over 120 Minutes Intravenous  Once 11/30/20 1556 11/30/20 1925   11/30/20 1645  piperacillin-tazobactam (ZOSYN) IVPB 3.375 g  Status:  Discontinued        3.375 g 12.5 mL/hr over 240 Minutes Intravenous Every 8 hours 11/30/20 1556 12/01/20 0951   11/30/20 1000  fluconazole (DIFLUCAN) tablet 100 mg  Status:  Discontinued        100 mg Oral Daily 11/29/20 1916 11/29/20 1952      Medications: Scheduled Meds: . acetaminophen  1,000 mg Oral Q6H  . ARIPiprazole  5 mg Oral QPM  . Chlorhexidine Gluconate Cloth  6 each Topical Q0600  . DULoxetine  60 mg Oral Daily  . enoxaparin (LOVENOX) injection  60 mg Subcutaneous Q24H  . fluconazole  200 mg Oral Daily  . gabapentin  600 mg Oral TID  . insulin aspart  0-9 Units Subcutaneous TID WC  . lidocaine  1 patch Transdermal Q24H  . lipase/protease/amylase  36,000 Units Oral TID WC  . mirtazapine  30 mg Oral QHS  . mometasone-formoterol  2 puff  Inhalation BID  . montelukast  10 mg Oral q AM  . mupirocin ointment  1 application Nasal BID  . pantoprazole  40 mg Oral Daily  . propranolol ER  80 mg Oral Daily   Continuous Infusions: . vancomycin 1,250 mg (12/03/20 0516)   PRN Meds:.albuterol, ketorolac, oxyCODONE    Objective: Weight change:   Intake/Output Summary (Last 24 hours) at 12/03/2020 1416 Last data filed at 12/03/2020 1300 Gross per 24 hour  Intake 1985.53 ml  Output 1020 ml  Net 965.53 ml   Blood pressure 134/82, pulse 76, temperature 98.3 F (36.8 C), temperature source Oral, resp. rate 15, height 6\' 3"  (1.905 m), weight 115.3 kg, SpO2 98 %. Temp:  [97.7 F (36.5 C)-98.3 F (36.8 C)] 98.3 F (36.8 C) (03/22 1209) Pulse Rate:  [57-81] 76 (03/22 1209) Resp:  [11-18] 15 (03/22 1209) BP: (124-149)/(74-96) 134/82 (03/22 1209) SpO2:  [96 %-99 %] 98 % (03/22 1209) Weight:  [115.3 kg] 115.3 kg (03/21 1744)  Physical Exam: Physical Exam Constitutional:      Appearance: He is well-developed.  HENT:  Head: Normocephalic and atraumatic.  Eyes:     Conjunctiva/sclera: Conjunctivae normal.  Cardiovascular:     Rate and Rhythm: Normal rate and regular rhythm.  Pulmonary:     Effort: Pulmonary effort is normal. No respiratory distress.     Breath sounds: No wheezing.  Abdominal:     General: There is no distension.     Palpations: Abdomen is soft.  Musculoskeletal:        General: Normal range of motion.     Cervical back: Normal range of motion and neck supple.     Right hip: Tenderness present.       Legs:  Skin:    General: Skin is warm and dry.     Findings: No erythema or rash.  Neurological:     General: No focal deficit present.     Mental Status: He is alert and oriented to person, place, and time.  Psychiatric:        Mood and Affect: Mood normal.        Behavior: Behavior normal.        Thought Content: Thought content normal.        Judgment: Judgment normal.     Left foot  visualized with necrotic toe and adjacent area where he had amputation    CBC:    BMET Recent Labs    12/02/20 0302 12/03/20 0833  NA 132* 136  K 4.3 4.2  CL 100 103  CO2 25 26  GLUCOSE 289* 177*  BUN 15 12  CREATININE 0.99 0.83  CALCIUM 8.6* 9.0     Liver Panel  No results for input(s): PROT, ALBUMIN, AST, ALT, ALKPHOS, BILITOT, BILIDIR, IBILI in the last 72 hours.     Sedimentation Rate No results for input(s): ESRSEDRATE in the last 72 hours. C-Reactive Protein No results for input(s): CRP in the last 72 hours.  Micro Results: Recent Results (from the past 720 hour(s))  Culture, blood (routine x 2)     Status: Abnormal   Collection Time: 11/29/20  7:37 PM   Specimen: BLOOD LEFT ARM  Result Value Ref Range Status   Specimen Description BLOOD LEFT ARM  Final   Special Requests   Final    BOTTLES DRAWN AEROBIC ONLY Blood Culture adequate volume   Culture  Setup Time   Final    GRAM POSITIVE COCCI IN CLUSTERS AEROBIC BOTTLE ONLY CRITICAL RESULT CALLED TO, READ BACK BY AND VERIFIED WITH: L,CHEN PHARMD @1951  11/30/20 EB Performed at [facility], [place] [address] [place], [place] [address]    Culture METHICILLIN RESISTANT STAPHYLOCOCCUS AUREUS (A)  Final   Report Status 12/02/2020 FINAL  Final   Organism ID, Bacteria METHICILLIN RESISTANT STAPHYLOCOCCUS AUREUS  Final      Susceptibility   Methicillin resistant staphylococcus aureus - MIC*    CIPROFLOXACIN <=0.5 SENSITIVE Sensitive     ERYTHROMYCIN >=8 RESISTANT Resistant     GENTAMICIN <=0.5 SENSITIVE Sensitive     OXACILLIN >=4 RESISTANT Resistant     TETRACYCLINE 2 SENSITIVE Sensitive     VANCOMYCIN 1 SENSITIVE Sensitive     TRIMETH/SULFA <=10 SENSITIVE Sensitive     CLINDAMYCIN <=0.25 SENSITIVE Sensitive     RIFAMPIN <=0.5 SENSITIVE Sensitive     Inducible Clindamycin NEGATIVE Sensitive     * METHICILLIN RESISTANT STAPHYLOCOCCUS AUREUS  Culture, blood (routine x 2)     Status: Abnormal    Collection Time: 11/29/20  7:37 PM   Specimen: BLOOD LEFT ARM  Result  Value Ref Range Status   Specimen Description BLOOD LEFT ARM  Final   Special Requests   Final    BOTTLES DRAWN AEROBIC ONLY Blood Culture adequate volume   Culture  Setup Time   Final    GRAM POSITIVE COCCI IN CLUSTERS AEROBIC BOTTLE ONLY CRITICAL VALUE NOTED.  VALUE IS CONSISTENT WITH PREVIOUSLY REPORTED AND CALLED VALUE.    Culture (A)  Final    STAPHYLOCOCCUS AUREUS SUSCEPTIBILITIES PERFORMED ON PREVIOUS CULTURE WITHIN THE LAST 5 DAYS. Performed at [facility], [place] [address], [place], [place] [address]    Report Status 12/02/2020 FINAL  Final  Blood Culture ID Panel (Reflexed)     Status: Abnormal   Collection Time: 11/29/20  7:37 PM  Result Value Ref Range Status   Enterococcus faecalis NOT DETECTED NOT DETECTED Final   Enterococcus Faecium NOT DETECTED NOT DETECTED Final   Listeria monocytogenes NOT DETECTED NOT DETECTED Final   Staphylococcus species DETECTED (A) NOT DETECTED Final    Comment: CRITICAL RESULT CALLED TO, READ BACK BY AND VERIFIED WITH: L,CHEN PHARMD @1953  11/30/20 EB    Staphylococcus aureus (BCID) DETECTED (A) NOT DETECTED Final    Comment: Methicillin (oxacillin)-resistant Staphylococcus aureus (MRSA). MRSA is predictably resistant to beta-lactam antibiotics (except ceftaroline). Preferred therapy is vancomycin unless clinically contraindicated. Patient requires contact precautions if  hospitalized. CRITICAL RESULT CALLED TO, READ BACK BY AND VERIFIED WITH: L,CHEN PHARMD @1953  11/30/20 EB    Staphylococcus epidermidis NOT DETECTED NOT DETECTED Final   Staphylococcus lugdunensis NOT DETECTED NOT DETECTED Final   Streptococcus species NOT DETECTED NOT DETECTED Final   Streptococcus agalactiae NOT DETECTED NOT DETECTED Final   Streptococcus pneumoniae NOT DETECTED NOT DETECTED Final   Streptococcus pyogenes NOT DETECTED NOT DETECTED Final   A.calcoaceticus-baumannii NOT DETECTED  NOT DETECTED Final   Bacteroides fragilis NOT DETECTED NOT DETECTED Final   Enterobacterales NOT DETECTED NOT DETECTED Final   Enterobacter cloacae complex NOT DETECTED NOT DETECTED Final   Escherichia coli NOT DETECTED NOT DETECTED Final   Klebsiella aerogenes NOT DETECTED NOT DETECTED Final   Klebsiella oxytoca NOT DETECTED NOT DETECTED Final   Klebsiella pneumoniae NOT DETECTED NOT DETECTED Final   Proteus species NOT DETECTED NOT DETECTED Final   Salmonella species NOT DETECTED NOT DETECTED Final   Serratia marcescens NOT DETECTED NOT DETECTED Final   Haemophilus influenzae NOT DETECTED NOT DETECTED Final   Neisseria meningitidis NOT DETECTED NOT DETECTED Final   Pseudomonas aeruginosa NOT DETECTED NOT DETECTED Final   Stenotrophomonas maltophilia NOT DETECTED NOT DETECTED Final   Candida albicans NOT DETECTED NOT DETECTED Final   Candida auris NOT DETECTED NOT DETECTED Final   Candida glabrata NOT DETECTED NOT DETECTED Final   Candida krusei NOT DETECTED NOT DETECTED Final   Candida parapsilosis NOT DETECTED NOT DETECTED Final   Candida tropicalis NOT DETECTED NOT DETECTED Final   Cryptococcus neoformans/gattii NOT DETECTED NOT DETECTED Final   Meth resistant mecA/C and MREJ DETECTED (A) NOT DETECTED Final    Comment: CRITICAL RESULT CALLED TO, READ BACK BY AND VERIFIED WITH: L,CHEN PHARMD @1953  11/30/20 EB Performed at [facility], [address], [place], [place] [address]   Culture, Urine     Status: None   Collection Time: 11/29/20  9:25 PM   Specimen: Urine, Random  Result Value Ref Range Status   Specimen Description URINE, RANDOM  Final   Special Requests NONE  Final   Culture   Final    NO GROWTH Performed  at [facility], [place] [address] [place], [place] [address]    Report Status 12/01/2020 FINAL  Final  Culture, blood (routine x 2)     Status: None (Preliminary result)   Collection Time: 12/01/20 10:16 AM   Specimen: BLOOD LEFT HAND  Result Value  Ref Range Status   Specimen Description BLOOD LEFT HAND  Final   Special Requests   Final    BOTTLES DRAWN AEROBIC AND ANAEROBIC Blood Culture results may not be optimal due to an inadequate volume of blood received in culture bottles   Culture   Final    NO GROWTH 2 DAYS Performed at [facility], [place] [address] [place], [place] [address]    Report Status PENDING  Incomplete  Culture, blood (routine x 2)     Status: None (Preliminary result)   Collection Time: 12/01/20 10:23 AM   Specimen: BLOOD RIGHT HAND  Result Value Ref Range Status   Specimen Description BLOOD RIGHT HAND  Final   Special Requests   Final    BOTTLES DRAWN AEROBIC AND ANAEROBIC Blood Culture adequate volume   Culture  Setup Time   Final    ANAEROBIC BOTTLE ONLY GRAM POSITIVE COCCI CRITICAL VALUE NOTED.  VALUE IS CONSISTENT WITH PREVIOUSLY REPORTED AND CALLED VALUE.    Culture   Final    NO GROWTH 2 DAYS Performed at [facility], [place] [address], [place], [place] [address]    Report Status PENDING  Incomplete  Surgical pcr screen     Status: Abnormal   Collection Time: 12/01/20 10:15 PM   Specimen: Nasal Mucosa; Nasal Swab  Result Value Ref Range Status   MRSA, PCR POSITIVE (A) NEGATIVE Final    Comment: RESULT CALLED TO, READ BACK BY AND VERIFIED WITH: ROGERS,P RN 0001 12/02/2020 MITCHELL,L    Staphylococcus aureus POSITIVE (A) NEGATIVE Final    Comment: (NOTE) The Xpert SA Assay (FDA approved for NASAL specimens in patients 37 years of age and older), is one component of a comprehensive surveillance program. It is not intended to diagnose infection nor to guide or monitor treatment. Performed at [facility], [place] [address], [place], [place] [address]   Resp Panel by RT-PCR (Flu A&B, Covid) Nasopharyngeal Swab     Status: None   Collection Time: 12/02/20  8:15 AM   Specimen: Nasopharyngeal Swab; Nasopharyngeal(NP) swabs in vial transport medium  Result Value Ref Range Status   SARS  Coronavirus 2 by RT PCR NEGATIVE NEGATIVE Final    Comment: (NOTE) SARS-CoV-2 target nucleic acids are NOT DETECTED.  The SARS-CoV-2 RNA is generally detectable in upper respiratory specimens during the acute phase of infection. The lowest concentration of SARS-CoV-2 viral copies this assay can detect is 138 copies/mL. A negative result does not preclude SARS-Cov-2 infection and should not be used as the sole basis for treatment or other patient management decisions. A negative result may occur with  improper specimen collection/handling, submission of specimen other than nasopharyngeal swab, presence of viral mutation(s) within the areas targeted by this assay, and inadequate number of viral copies(<138 copies/mL). A negative result must be combined with clinical observations, patient history, and epidemiological information. The expected result is Negative.  Fact Sheet for Patients:  EntrepreneurPulse.com.au  Fact Sheet for Healthcare Providers:  IncredibleEmployment.be  This test is no t yet approved or cleared by the [place] FDA and  has been authorized for detection and/or diagnosis of SARS-CoV-2 by FDA under an Emergency  Use Authorization (EUA). This EUA will remain  in effect (meaning this test can be used) for the duration of the COVID-19 declaration under Section 564(b)(1) of the Act, 21 U.S.C.section 360bbb-3(b)(1), unless the authorization is terminated  or revoked sooner.       Influenza A by PCR NEGATIVE NEGATIVE Final   Influenza B by PCR NEGATIVE NEGATIVE Final    Comment: (NOTE) The Xpert Xpress SARS-CoV-2/FLU/RSV plus assay is intended as an aid in the diagnosis of influenza from Nasopharyngeal swab specimens and should not be used as a sole basis for treatment. Nasal washings and aspirates are unacceptable for Xpert Xpress SARS-CoV-2/FLU/RSV testing.  Fact Sheet for  Patients: EntrepreneurPulse.com.au  Fact Sheet for Healthcare Providers: IncredibleEmployment.be  This test is not yet approved or cleared by the [place] FDA and has been authorized for detection and/or diagnosis of SARS-CoV-2 by FDA under an Emergency Use Authorization (EUA). This EUA will remain in effect (meaning this test can be used) for the duration of the COVID-19 declaration under Section 564(b)(1) of the Act, 21 U.S.C. section 360bbb-3(b)(1), unless the authorization is terminated or revoked.  Performed at [facility], [place] [address] [place], [place] [address]   Aerobic/Anaerobic Culture w Gram Stain (surgical/deep wound)     Status: None (Preliminary result)   Collection Time: 12/02/20  4:34 PM   Specimen: PATH Digit amputation; Tissue  Result Value Ref Range Status   Specimen Description TISSUE LEFT TOE  Final   Special Requests PATIENT ON FOLLOWING CEFEPIME MAXIPIME VANC  Final   Gram Stain   Final    RARE WBC PRESENT, PREDOMINANTLY PMN NO ORGANISMS SEEN    Culture   Final    NO GROWTH < 24 HOURS Performed at [place] [address], [place], [place] [address]    Report Status PENDING  Incomplete    Studies/Results: MR HIP RIGHT W WO CONTRAST  Result Date: 12/02/2020 CLINICAL DATA:  Right groin pain for the past week. Left foot infection, T9-T10 osteomyelitis discitis, and MRSA bacteremia. EXAM: MRI OF THE RIGHT HIP WITHOUT AND WITH CONTRAST TECHNIQUE: Multiplanar, multisequence MR imaging was performed both before and after administration of intravenous contrast. CONTRAST:  62mL GADAVIST GADOBUTROL 1 MMOL/ML IV SOLN COMPARISON:  CT abdomen pelvis dated October 23, 2020. FINDINGS: Bones: Subtle increased marrow edema within the parasymphyseal right superior pubic ramus with corresponding decreased T1 marrow signal (series 2 and 3, image 24). There is no evidence of acute fracture, dislocation or avascular  necrosis. 6 mm round T2 hyperintense, predominantly T1 hypointense lesion in the posterior right iliac bone with some internal T2 N1 hyperintense foci, corresponding to a small sclerotic area seen on CT, likely a hemangioma. Unchanged bone island in the left iliac bone. The visualized sacroiliac joints and symphysis pubis appear normal. Articular cartilage and labrum Articular cartilage: No focal chondral defect or subchondral signal abnormality identified. Labrum: Grossly intact, although evaluation is limited due to lack of intra-articular fluid. No paralabral abnormality. Joint or bursal effusion Joint effusion: Trace bilateral hip joint effusions. Bursae: No focal periarticular fluid collection. Muscles and tendons Muscles and tendons: The visualized gluteus, hamstring and iliopsoas tendons appear normal. Prominent edema and enhancement of the right adductor minimus muscle. No muscle atrophy. Other findings Miscellaneous: The visualized internal pelvic contents appear unremarkable. Reactive bilateral inguinal lymphadenopathy. IMPRESSION: 1. Subtle increased marrow edema within the parasymphyseal right superior pubic ramus with corresponding decreased T1 marrow signal, concerning for early osteomyelitis. 2. Prominent edema and  enhancement of the right adductor minimus muscle adjacent to the superior pubic ramus, concerning for myositis. No abscess. 3. No septic arthritis. Electronically Signed   By: Titus Dubin M.D.   On: 12/02/2020 13:50   ECHOCARDIOGRAM COMPLETE  Result Date: 12/01/2020    ECHOCARDIOGRAM REPORT   Patient Name:   ARIYON MITTLEMAN Date of Exam: 12/01/2020 Medical Rec #:  [id number]      Height:       75.0 in Accession #:    [id number]     Weight:       249.0 lb Date of Birth:  1971/08/16       BSA:          2.409 m Patient Age:    50 years       BP:           115/79 mmHg Patient Gender: M              HR:           83 bpm. Exam Location:  Inpatient Procedure: 2D Echo, Cardiac Doppler and Color  Doppler Indications:    Bacteremia  History:        Patient has no prior history of Echocardiogram examinations.                 COPD; Risk Factors:Current Smoker and Diabetes. GERD. ETOH                 abuse.  Sonographer:    Clayton Lefort RDCS (AE) Referring Phys: [id number] [facility] T VU  Sonographer Comments: Technically difficult study due to poor echo windows, suboptimal parasternal window, suboptimal apical window and suboptimal subcostal window. Image acquisition challenging due to COPD and Image acquisition challenging due to respiratory motion. Limited patient mobility. IMPRESSIONS  1. Left ventricular ejection fraction, by estimation, is 60 to 65%. The left ventricle has normal function. The left ventricle has no regional wall motion abnormalities. Left ventricular diastolic parameters were normal.  2. Right ventricular systolic function is normal. The right ventricular size is normal.  3. The pericardial effusion is anterior to the right ventricle.  4. The mitral valve was not well visualized. No evidence of mitral valve regurgitation.  5. The aortic valve was not well visualized. Aortic valve regurgitation is not visualized. Aortic valve area, by VTI measures 4.36 cm. Aortic valve mean gradient measures 3.0 mmHg. Aortic valve Vmax measures 1.19 m/s.  6. Aortic dilatation noted. There is borderline dilatation of the aortic root, measuring 37 mm.  7. The inferior vena cava is normal in size with greater than 50% respiratory variability, suggesting right atrial pressure of 3 mmHg.  8. Poor image quality precludes adequate assessment of valves. Cannot rule out vegetation on mitral valve in the apical 2 chamber view. The AV cannot be assessed. Suggest TEE for further evaulation. FINDINGS  Left Ventricle: Left ventricular ejection fraction, by estimation, is 60 to 65%. The left ventricle has normal function. The left ventricle has no regional wall motion abnormalities. The left ventricular internal cavity size was  normal in size. There is  no left ventricular hypertrophy. Left ventricular diastolic parameters were normal. Normal left ventricular filling pressure. Right Ventricle: The right ventricular size is normal. No increase in right ventricular wall thickness. Right ventricular systolic function is normal. Left Atrium: Left atrial size was normal in size. Right Atrium: Right atrial size was normal in size. Pericardium: Trivial pericardial effusion is present. The pericardial effusion is anterior to the right  ventricle. Mitral Valve: The mitral valve was not well visualized. Mild mitral annular calcification. No evidence of mitral valve regurgitation. Tricuspid Valve: The tricuspid valve is normal in structure. Tricuspid valve regurgitation is trivial. No evidence of tricuspid stenosis. Aortic Valve: The aortic valve was not well visualized. Aortic valve regurgitation is not visualized. Aortic valve mean gradient measures 3.0 mmHg. Aortic valve peak gradient measures 5.7 mmHg. Aortic valve area, by VTI measures 4.36 cm. Pulmonic Valve: The pulmonic valve was not well visualized. Pulmonic valve regurgitation is not visualized. Aorta: The aortic root is normal in size and structure and aortic dilatation noted. There is borderline dilatation of the aortic root, measuring 37 mm. Venous: The inferior vena cava is normal in size with greater than 50% respiratory variability, suggesting right atrial pressure of 3 mmHg. IAS/Shunts: No atrial level shunt detected by color flow Doppler.  LEFT VENTRICLE PLAX 2D LVIDd:         4.30 cm  Diastology LVIDs:         3.10 cm  LV e' medial:    12.30 cm/s LV PW:         1.60 cm  LV E/e' medial:  5.7 LV IVS:        1.00 cm  LV e' lateral:   9.57 cm/s LVOT diam:     2.40 cm  LV E/e' lateral: 7.4 LV SV:         84 LV SV Index:   35 LVOT Area:     4.52 cm  RIGHT VENTRICLE             IVC RV S prime:     12.30 cm/s  IVC diam: 2.30 cm TAPSE (M-mode): 2.9 cm LEFT ATRIUM           Index        RIGHT ATRIUM           Index LA diam:      4.20 cm 1.74 cm/m  RA Area:     21.00 cm LA Vol (A4C): 43.8 ml 18.19 ml/m RA Volume:   63.80 ml  26.49 ml/m  AORTIC VALVE AV Area (Vmax):    3.99 cm AV Area (Vmean):   4.31 cm AV Area (VTI):     4.36 cm AV Vmax:           119.00 cm/s AV Vmean:          74.800 cm/s AV VTI:            0.192 m AV Peak Grad:      5.7 mmHg AV Mean Grad:      3.0 mmHg LVOT Vmax:         105.00 cm/s LVOT Vmean:        71.300 cm/s LVOT VTI:          0.185 m LVOT/AV VTI ratio: 0.96  AORTA Ao Root diam: 3.70 cm MITRAL VALVE MV Area (PHT): 3.65 cm    SHUNTS MV Decel Time: 208 msec    Systemic VTI:  0.18 m MV E velocity: 70.70 cm/s  Systemic Diam: 2.40 cm MV A velocity: 56.40 cm/s MV E/A ratio:  1.25 Fransico Him MD Electronically signed by Fransico Him MD Signature Date/Time: 12/01/2020/4:45:26 PM    Final       Assessment/Plan:  INTERVAL HISTORY: Patient underwent resection of his second toe  MRI of the hip is concerning for possible osteomyelitis in the superior pubic ramus as well as edema and enhancement of  the right abductor minimus   Active Problems:   Diabetic foot infection ([place])   Osteomyelitis ([place])   MRSA bacteremia   Right hip pain    JOHNWESLEY LEDERMAN is a 50 y.o. male with with diabetes mellitus I saw in 2021 when he had a nonhealing wound and underwent a right transmetatarsal amputation which we treated with vancomycin cefepime and Flagyl in the past.  He was admitted with back pain and found on MRI to have T9-T10 discitis and osteomyelitis.  He also has clear necrotic infection involving his second toe.  Blood cultures have been positive for methicillin-resistant Staphylococcus aureus.  MRI of the foot shows evidence of osteomyelitis in the distal phalanx of the second toe as well as edema enhancement in the proximal middle phalange of the second toe and plantar surface of the head of the second metatarsal  There was also edema and enhancement in the head  and neck of the first metatarsal and medial and lateral sesamoids that were also working for osteomyelitis.  Vascular surgery were aware of MRI findings and Dr. Oneida Alar felt that there is no indication to intervene upon this site seen on the MRI at site of prior amputation but did amputate the second toe along with the metatarsal head.  I agree with obtaining TEE to better evaluate his valves as well as the pericardial effusion  Also agree that he is not need IR guided biopsy of his disc space as this procedure would be low yield and he undoubtedly has infection with MRSA there as well.  The MRI of the hip is concerning.  I do not see something that orthopedic surgery would intervene upon at this time but this area should be carefully monitored and if he develops worsening pain he should have a repeat MRI to look for pyomyositis.  NOTE I DID COME BACk and discussed findings with him. Informed im we would monitor and might need to repeat MRI and that the pubic ramus finding would mean more protracted oral antibiotics  Indeed has osteo of the pubic bone he may need quite protracted antibiotics  Would like to change him over to daptomycin for a bit greater safety when he does discharge to home.   Candidal esophagitis: This was seen on EGD we will continue fluconazole    [name] has an appointment on 12/20/2020 at [facility] is located in the [facility] at  [address] in [place].  [address], which is located to the left of the elevators.  Phone: [phone]  Fax: [phone]  [email or web address]   He will need to arrive 15 minutes piror to his appointment.  I spent greater than 35 minutes with the patient including greater than 50their care.% of time in face to face counsel of the patient and his wife and in coordination of his care.    LOS: 4 days   Alcide Evener 12/03/2020, 2:16  PM

## 2020-12-03 NOTE — Plan of Care (Signed)
  Problem: Activity: Goal: Risk for activity intolerance will decrease Outcome: Progressing   Problem: Coping: Goal: Level of anxiety will decrease Outcome: Progressing   Problem: Pain Managment: Goal: General experience of comfort will improve Outcome: Progressing   Problem: Safety: Goal: Ability to remain free from injury will improve Outcome: Progressing   Problem: Skin Integrity: Goal: Risk for impaired skin integrity will decrease Outcome: Progressing   

## 2020-12-03 NOTE — H&P (View-Only) (Signed)
Subjective:  No new complaints   Antibiotics:  Anti-infectives (From admission, onward)   Start     Dose/Rate Route Frequency Ordered Stop   12/02/20 1630  ceFEPIme (MAXIPIME) 2 g in sodium chloride 0.9 % 100 mL IVPB  Status:  Discontinued        2 g 200 mL/hr over 30 Minutes Intravenous  Once 12/02/20 1609 12/02/20 1850   12/01/20 1845  fluconazole (DIFLUCAN) tablet 200 mg        200 mg Oral Daily 12/01/20 1753 12/08/20 0959   12/01/20 1400  ceFEPIme (MAXIPIME) 2 g in sodium chloride 0.9 % 100 mL IVPB  Status:  Discontinued        2 g 200 mL/hr over 30 Minutes Intravenous Every 8 hours 12/01/20 1007 12/02/20 0910   12/01/20 1045  metroNIDAZOLE (FLAGYL) tablet 500 mg  Status:  Discontinued        500 mg Oral Every 12 hours 12/01/20 0951 12/02/20 0910   12/01/20 0500  vancomycin (VANCOREADY) IVPB 1250 mg/250 mL        1,250 mg 166.7 mL/hr over 90 Minutes Intravenous Every 12 hours 11/30/20 1641     11/30/20 1645  vancomycin (VANCOREADY) IVPB 1500 mg/300 mL        1,500 mg 150 mL/hr over 120 Minutes Intravenous  Once 11/30/20 1556 11/30/20 1925   11/30/20 1645  piperacillin-tazobactam (ZOSYN) IVPB 3.375 g  Status:  Discontinued        3.375 g 12.5 mL/hr over 240 Minutes Intravenous Every 8 hours 11/30/20 1556 12/01/20 0951   11/30/20 1000  fluconazole (DIFLUCAN) tablet 100 mg  Status:  Discontinued        100 mg Oral Daily 11/29/20 1916 11/29/20 1952      Medications: Scheduled Meds: . acetaminophen  1,000 mg Oral Q6H  . ARIPiprazole  5 mg Oral QPM  . Chlorhexidine Gluconate Cloth  6 each Topical Q0600  . DULoxetine  60 mg Oral Daily  . enoxaparin (LOVENOX) injection  60 mg Subcutaneous Q24H  . fluconazole  200 mg Oral Daily  . gabapentin  600 mg Oral TID  . insulin aspart  0-9 Units Subcutaneous TID WC  . lidocaine  1 patch Transdermal Q24H  . lipase/protease/amylase  36,000 Units Oral TID WC  . mirtazapine  30 mg Oral QHS  . mometasone-formoterol  2 puff  Inhalation BID  . montelukast  10 mg Oral q AM  . mupirocin ointment  1 application Nasal BID  . pantoprazole  40 mg Oral Daily  . propranolol ER  80 mg Oral Daily   Continuous Infusions: . vancomycin 1,250 mg (12/03/20 0516)   PRN Meds:.albuterol, ketorolac, oxyCODONE    Objective: Weight change:   Intake/Output Summary (Last 24 hours) at 12/03/2020 1416 Last data filed at 12/03/2020 1300 Gross per 24 hour  Intake 1985.53 ml  Output 1020 ml  Net 965.53 ml   Blood pressure 134/82, pulse 76, temperature 98.3 F (36.8 C), temperature source Oral, resp. rate 15, height 6\' 3"  (1.905 m), weight 115.3 kg, SpO2 98 %. Temp:  [97.7 F (36.5 C)-98.3 F (36.8 C)] 98.3 F (36.8 C) (03/22 1209) Pulse Rate:  [57-81] 76 (03/22 1209) Resp:  [11-18] 15 (03/22 1209) BP: (124-149)/(74-96) 134/82 (03/22 1209) SpO2:  [96 %-99 %] 98 % (03/22 1209) Weight:  [115.3 kg] 115.3 kg (03/21 1744)  Physical Exam: Physical Exam Constitutional:      Appearance: He is well-developed.  HENT:  Head: Normocephalic and atraumatic.  Eyes:     Conjunctiva/sclera: Conjunctivae normal.  Cardiovascular:     Rate and Rhythm: Normal rate and regular rhythm.  Pulmonary:     Effort: Pulmonary effort is normal. No respiratory distress.     Breath sounds: No wheezing.  Abdominal:     General: There is no distension.     Palpations: Abdomen is soft.  Musculoskeletal:        General: Normal range of motion.     Cervical back: Normal range of motion and neck supple.     Right hip: Tenderness present.       Legs:  Skin:    General: Skin is warm and dry.     Findings: No erythema or rash.  Neurological:     General: No focal deficit present.     Mental Status: He is alert and oriented to person, place, and time.  Psychiatric:        Mood and Affect: Mood normal.        Behavior: Behavior normal.        Thought Content: Thought content normal.        Judgment: Judgment normal.     Left foot  visualized with necrotic toe and adjacent area where he had amputation    CBC:    BMET Recent Labs    12/02/20 0302 12/03/20 0833  NA 132* 136  K 4.3 4.2  CL 100 103  CO2 25 26  GLUCOSE 289* 177*  BUN 15 12  CREATININE 0.99 0.83  CALCIUM 8.6* 9.0     Liver Panel  No results for input(s): PROT, ALBUMIN, AST, ALT, ALKPHOS, BILITOT, BILIDIR, IBILI in the last 72 hours.     Sedimentation Rate No results for input(s): ESRSEDRATE in the last 72 hours. C-Reactive Protein No results for input(s): CRP in the last 72 hours.  Micro Results: Recent Results (from the past 720 hour(s))  Culture, blood (routine x 2)     Status: Abnormal   Collection Time: 11/29/20  7:37 PM   Specimen: BLOOD LEFT ARM  Result Value Ref Range Status   Specimen Description BLOOD LEFT ARM  Final   Special Requests   Final    BOTTLES DRAWN AEROBIC ONLY Blood Culture adequate volume   Culture  Setup Time   Final    GRAM POSITIVE COCCI IN CLUSTERS AEROBIC BOTTLE ONLY CRITICAL RESULT CALLED TO, READ BACK BY AND VERIFIED WITH: L,CHEN PHARMD @1951  11/30/20 EB Performed at [facility], [name] [address], [place], [place] [address]    Culture METHICILLIN RESISTANT STAPHYLOCOCCUS AUREUS (A)  Final   Report Status 12/02/2020 FINAL  Final   Organism ID, Bacteria METHICILLIN RESISTANT STAPHYLOCOCCUS AUREUS  Final      Susceptibility   Methicillin resistant staphylococcus aureus - MIC*    CIPROFLOXACIN <=0.5 SENSITIVE Sensitive     ERYTHROMYCIN >=8 RESISTANT Resistant     GENTAMICIN <=0.5 SENSITIVE Sensitive     OXACILLIN >=4 RESISTANT Resistant     TETRACYCLINE 2 SENSITIVE Sensitive     VANCOMYCIN 1 SENSITIVE Sensitive     TRIMETH/SULFA <=10 SENSITIVE Sensitive     CLINDAMYCIN <=0.25 SENSITIVE Sensitive     RIFAMPIN <=0.5 SENSITIVE Sensitive     Inducible Clindamycin NEGATIVE Sensitive     * METHICILLIN RESISTANT STAPHYLOCOCCUS AUREUS  Culture, blood (routine x 2)     Status: Abnormal    Collection Time: 11/29/20  7:37 PM   Specimen: BLOOD LEFT ARM  Result  Value Ref Range Status   Specimen Description BLOOD LEFT ARM  Final   Special Requests   Final    BOTTLES DRAWN AEROBIC ONLY Blood Culture adequate volume   Culture  Setup Time   Final    GRAM POSITIVE COCCI IN CLUSTERS AEROBIC BOTTLE ONLY CRITICAL VALUE NOTED.  VALUE IS CONSISTENT WITH PREVIOUSLY REPORTED AND CALLED VALUE.    Culture (A)  Final    STAPHYLOCOCCUS AUREUS SUSCEPTIBILITIES PERFORMED ON PREVIOUS CULTURE WITHIN THE LAST 5 DAYS. Performed at [facility], [place] [address], [place], [place] [address]    Report Status 12/02/2020 FINAL  Final  Blood Culture ID Panel (Reflexed)     Status: Abnormal   Collection Time: 11/29/20  7:37 PM  Result Value Ref Range Status   Enterococcus faecalis NOT DETECTED NOT DETECTED Final   Enterococcus Faecium NOT DETECTED NOT DETECTED Final   Listeria monocytogenes NOT DETECTED NOT DETECTED Final   Staphylococcus species DETECTED (A) NOT DETECTED Final    Comment: CRITICAL RESULT CALLED TO, READ BACK BY AND VERIFIED WITH: L,CHEN PHARMD @1953  11/30/20 EB    Staphylococcus aureus (BCID) DETECTED (A) NOT DETECTED Final    Comment: Methicillin (oxacillin)-resistant Staphylococcus aureus (MRSA). MRSA is predictably resistant to beta-lactam antibiotics (except ceftaroline). Preferred therapy is vancomycin unless clinically contraindicated. Patient requires contact precautions if  hospitalized. CRITICAL RESULT CALLED TO, READ BACK BY AND VERIFIED WITH: L,CHEN PHARMD @1953  11/30/20 EB    Staphylococcus epidermidis NOT DETECTED NOT DETECTED Final   Staphylococcus lugdunensis NOT DETECTED NOT DETECTED Final   Streptococcus species NOT DETECTED NOT DETECTED Final   Streptococcus agalactiae NOT DETECTED NOT DETECTED Final   Streptococcus pneumoniae NOT DETECTED NOT DETECTED Final   Streptococcus pyogenes NOT DETECTED NOT DETECTED Final   A.calcoaceticus-baumannii NOT DETECTED  NOT DETECTED Final   Bacteroides fragilis NOT DETECTED NOT DETECTED Final   Enterobacterales NOT DETECTED NOT DETECTED Final   Enterobacter cloacae complex NOT DETECTED NOT DETECTED Final   Escherichia coli NOT DETECTED NOT DETECTED Final   Klebsiella aerogenes NOT DETECTED NOT DETECTED Final   Klebsiella oxytoca NOT DETECTED NOT DETECTED Final   Klebsiella pneumoniae NOT DETECTED NOT DETECTED Final   Proteus species NOT DETECTED NOT DETECTED Final   Salmonella species NOT DETECTED NOT DETECTED Final   Serratia marcescens NOT DETECTED NOT DETECTED Final   Haemophilus influenzae NOT DETECTED NOT DETECTED Final   Neisseria meningitidis NOT DETECTED NOT DETECTED Final   Pseudomonas aeruginosa NOT DETECTED NOT DETECTED Final   Stenotrophomonas maltophilia NOT DETECTED NOT DETECTED Final   Candida albicans NOT DETECTED NOT DETECTED Final   Candida auris NOT DETECTED NOT DETECTED Final   Candida glabrata NOT DETECTED NOT DETECTED Final   Candida krusei NOT DETECTED NOT DETECTED Final   Candida parapsilosis NOT DETECTED NOT DETECTED Final   Candida tropicalis NOT DETECTED NOT DETECTED Final   Cryptococcus neoformans/gattii NOT DETECTED NOT DETECTED Final   Meth resistant mecA/C and MREJ DETECTED (A) NOT DETECTED Final    Comment: CRITICAL RESULT CALLED TO, READ BACK BY AND VERIFIED WITH: L,CHEN PHARMD @1953  11/30/20 EB Performed at [facility], [address] [place], [place] [address]   Culture, Urine     Status: None   Collection Time: 11/29/20  9:25 PM   Specimen: Urine, Random  Result Value Ref Range Status   Specimen Description URINE, RANDOM  Final   Special Requests NONE  Final   Culture   Final    NO GROWTH Performed  at [facility], [place] [address], [place], [place] [address]    Report Status 12/01/2020 FINAL  Final  Culture, blood (routine x 2)     Status: None (Preliminary result)   Collection Time: 12/01/20 10:16 AM   Specimen: BLOOD LEFT HAND  Result Value  Ref Range Status   Specimen Description BLOOD LEFT HAND  Final   Special Requests   Final    BOTTLES DRAWN AEROBIC AND ANAEROBIC Blood Culture results may not be optimal due to an inadequate volume of blood received in culture bottles   Culture   Final    NO GROWTH 2 DAYS Performed at [facility], [place] [address], [place], [place] [address]    Report Status PENDING  Incomplete  Culture, blood (routine x 2)     Status: None (Preliminary result)   Collection Time: 12/01/20 10:23 AM   Specimen: BLOOD RIGHT HAND  Result Value Ref Range Status   Specimen Description BLOOD RIGHT HAND  Final   Special Requests   Final    BOTTLES DRAWN AEROBIC AND ANAEROBIC Blood Culture adequate volume   Culture  Setup Time   Final    ANAEROBIC BOTTLE ONLY GRAM POSITIVE COCCI CRITICAL VALUE NOTED.  VALUE IS CONSISTENT WITH PREVIOUSLY REPORTED AND CALLED VALUE.    Culture   Final    NO GROWTH 2 DAYS Performed at [facility], [place] [address], [place], [place] [address]    Report Status PENDING  Incomplete  Surgical pcr screen     Status: Abnormal   Collection Time: 12/01/20 10:15 PM   Specimen: Nasal Mucosa; Nasal Swab  Result Value Ref Range Status   MRSA, PCR POSITIVE (A) NEGATIVE Final    Comment: RESULT CALLED TO, READ BACK BY AND VERIFIED WITH: ROGERS,P RN 0001 12/02/2020 MITCHELL,L    Staphylococcus aureus POSITIVE (A) NEGATIVE Final    Comment: (NOTE) The Xpert SA Assay (FDA approved for NASAL specimens in patients 73 years of age and older), is one component of a comprehensive surveillance program. It is not intended to diagnose infection nor to guide or monitor treatment. Performed at [facility], [place] [address], [place],  [address]   Resp Panel by RT-PCR (Flu A&B, Covid) Nasopharyngeal Swab     Status: None   Collection Time: 12/02/20  8:15 AM   Specimen: Nasopharyngeal Swab; Nasopharyngeal(NP) swabs in vial transport medium  Result Value Ref Range Status   SARS  Coronavirus 2 by RT PCR NEGATIVE NEGATIVE Final    Comment: (NOTE) SARS-CoV-2 target nucleic acids are NOT DETECTED.  The SARS-CoV-2 RNA is generally detectable in upper respiratory specimens during the acute phase of infection. The lowest concentration of SARS-CoV-2 viral copies this assay can detect is 138 copies/mL. A negative result does not preclude SARS-Cov-2 infection and should not be used as the sole basis for treatment or other patient management decisions. A negative result may occur with  improper specimen collection/handling, submission of specimen other than nasopharyngeal swab, presence of viral mutation(s) within the areas targeted by this assay, and inadequate number of viral copies(<138 copies/mL). A negative result must be combined with clinical observations, patient history, and epidemiological information. The expected result is Negative.  Fact Sheet for Patients:  EntrepreneurPulse.com.au  Fact Sheet for Healthcare Providers:  IncredibleEmployment.be  This test is no t yet approved or cleared by the [place] FDA and  has been authorized for detection and/or diagnosis of SARS-CoV-2 by FDA under an Emergency  Use Authorization (EUA). This EUA will remain  in effect (meaning this test can be used) for the duration of the COVID-19 declaration under Section 564(b)(1) of the Act, 21 U.S.C.section 360bbb-3(b)(1), unless the authorization is terminated  or revoked sooner.       Influenza A by PCR NEGATIVE NEGATIVE Final   Influenza B by PCR NEGATIVE NEGATIVE Final    Comment: (NOTE) The Xpert Xpress SARS-CoV-2/FLU/RSV plus assay is intended as an aid in the diagnosis of influenza from Nasopharyngeal swab specimens and should not be used as a sole basis for treatment. Nasal washings and aspirates are unacceptable for Xpert Xpress SARS-CoV-2/FLU/RSV testing.  Fact Sheet for  Patients: EntrepreneurPulse.com.au  Fact Sheet for Healthcare Providers: IncredibleEmployment.be  This test is not yet approved or cleared by the [place] FDA and has been authorized for detection and/or diagnosis of SARS-CoV-2 by FDA under an Emergency Use Authorization (EUA). This EUA will remain in effect (meaning this test can be used) for the duration of the COVID-19 declaration under Section 564(b)(1) of the Act, 21 U.S.C. section 360bbb-3(b)(1), unless the authorization is terminated or revoked.  Performed at [facility], [place] [address], [place], [place] [address]   Aerobic/Anaerobic Culture w Gram Stain (surgical/deep wound)     Status: None (Preliminary result)   Collection Time: 12/02/20  4:34 PM   Specimen: PATH Digit amputation; Tissue  Result Value Ref Range Status   Specimen Description TISSUE LEFT TOE  Final   Special Requests PATIENT ON FOLLOWING CEFEPIME MAXIPIME VANC  Final   Gram Stain   Final    RARE WBC PRESENT, PREDOMINANTLY PMN NO ORGANISMS SEEN    Culture   Final    NO GROWTH < 24 HOURS Performed at [facility] [address], [place],  [address]    Report Status PENDING  Incomplete    Studies/Results: MR HIP RIGHT W WO CONTRAST  Result Date: 12/02/2020 CLINICAL DATA:  Right groin pain for the past week. Left foot infection, T9-T10 osteomyelitis discitis, and MRSA bacteremia. EXAM: MRI OF THE RIGHT HIP WITHOUT AND WITH CONTRAST TECHNIQUE: Multiplanar, multisequence MR imaging was performed both before and after administration of intravenous contrast. CONTRAST:  76mL GADAVIST GADOBUTROL 1 MMOL/ML IV SOLN COMPARISON:  CT abdomen pelvis dated October 23, 2020. FINDINGS: Bones: Subtle increased marrow edema within the parasymphyseal right superior pubic ramus with corresponding decreased T1 marrow signal (series 2 and 3, image 24). There is no evidence of acute fracture, dislocation or avascular  necrosis. 6 mm round T2 hyperintense, predominantly T1 hypointense lesion in the posterior right iliac bone with some internal T2 N1 hyperintense foci, corresponding to a small sclerotic area seen on CT, likely a hemangioma. Unchanged bone island in the left iliac bone. The visualized sacroiliac joints and symphysis pubis appear normal. Articular cartilage and labrum Articular cartilage: No focal chondral defect or subchondral signal abnormality identified. Labrum: Grossly intact, although evaluation is limited due to lack of intra-articular fluid. No paralabral abnormality. Joint or bursal effusion Joint effusion: Trace bilateral hip joint effusions. Bursae: No focal periarticular fluid collection. Muscles and tendons Muscles and tendons: The visualized gluteus, hamstring and iliopsoas tendons appear normal. Prominent edema and enhancement of the right adductor minimus muscle. No muscle atrophy. Other findings Miscellaneous: The visualized internal pelvic contents appear unremarkable. Reactive bilateral inguinal lymphadenopathy. IMPRESSION: 1. Subtle increased marrow edema within the parasymphyseal right superior pubic ramus with corresponding decreased T1 marrow signal, concerning for early osteomyelitis. 2. Prominent edema and  enhancement of the right adductor minimus muscle adjacent to the superior pubic ramus, concerning for myositis. No abscess. 3. No septic arthritis. Electronically Signed   By: Titus Dubin M.D.   On: 12/02/2020 13:50   ECHOCARDIOGRAM COMPLETE  Result Date: 12/01/2020    ECHOCARDIOGRAM REPORT   Patient Name:   Christopher Fernandez Date of Exam: 12/01/2020 Medical Rec #:  [id number]      Height:       75.0 in Accession #:    [id number]     Weight:       249.0 lb Date of Birth:  1971/02/28       BSA:          2.409 m Patient Age:    45 years       BP:           115/79 mmHg Patient Gender: M              HR:           83 bpm. Exam Location:  Inpatient Procedure: 2D Echo, Cardiac Doppler and Color  Doppler Indications:    Bacteremia  History:        Patient has no prior history of Echocardiogram examinations.                 COPD; Risk Factors:Current Smoker and Diabetes. GERD. ETOH                 abuse.  Sonographer:    Clayton Lefort RDCS (AE) Referring Phys: [id number] [facility] T VU  Sonographer Comments: Technically difficult study due to poor echo windows, suboptimal parasternal window, suboptimal apical window and suboptimal subcostal window. Image acquisition challenging due to COPD and Image acquisition challenging due to respiratory motion. Limited patient mobility. IMPRESSIONS  1. Left ventricular ejection fraction, by estimation, is 60 to 65%. The left ventricle has normal function. The left ventricle has no regional wall motion abnormalities. Left ventricular diastolic parameters were normal.  2. Right ventricular systolic function is normal. The right ventricular size is normal.  3. The pericardial effusion is anterior to the right ventricle.  4. The mitral valve was not well visualized. No evidence of mitral valve regurgitation.  5. The aortic valve was not well visualized. Aortic valve regurgitation is not visualized. Aortic valve area, by VTI measures 4.36 cm. Aortic valve mean gradient measures 3.0 mmHg. Aortic valve Vmax measures 1.19 m/s.  6. Aortic dilatation noted. There is borderline dilatation of the aortic root, measuring 37 mm.  7. The inferior vena cava is normal in size with greater than 50% respiratory variability, suggesting right atrial pressure of 3 mmHg.  8. Poor image quality precludes adequate assessment of valves. Cannot rule out vegetation on mitral valve in the apical 2 chamber view. The AV cannot be assessed. Suggest TEE for further evaulation. FINDINGS  Left Ventricle: Left ventricular ejection fraction, by estimation, is 60 to 65%. The left ventricle has normal function. The left ventricle has no regional wall motion abnormalities. The left ventricular internal cavity size was  normal in size. There is  no left ventricular hypertrophy. Left ventricular diastolic parameters were normal. Normal left ventricular filling pressure. Right Ventricle: The right ventricular size is normal. No increase in right ventricular wall thickness. Right ventricular systolic function is normal. Left Atrium: Left atrial size was normal in size. Right Atrium: Right atrial size was normal in size. Pericardium: Trivial pericardial effusion is present. The pericardial effusion is anterior to the right  ventricle. Mitral Valve: The mitral valve was not well visualized. Mild mitral annular calcification. No evidence of mitral valve regurgitation. Tricuspid Valve: The tricuspid valve is normal in structure. Tricuspid valve regurgitation is trivial. No evidence of tricuspid stenosis. Aortic Valve: The aortic valve was not well visualized. Aortic valve regurgitation is not visualized. Aortic valve mean gradient measures 3.0 mmHg. Aortic valve peak gradient measures 5.7 mmHg. Aortic valve area, by VTI measures 4.36 cm. Pulmonic Valve: The pulmonic valve was not well visualized. Pulmonic valve regurgitation is not visualized. Aorta: The aortic root is normal in size and structure and aortic dilatation noted. There is borderline dilatation of the aortic root, measuring 37 mm. Venous: The inferior vena cava is normal in size with greater than 50% respiratory variability, suggesting right atrial pressure of 3 mmHg. IAS/Shunts: No atrial level shunt detected by color flow Doppler.  LEFT VENTRICLE PLAX 2D LVIDd:         4.30 cm  Diastology LVIDs:         3.10 cm  LV e' medial:    12.30 cm/s LV PW:         1.60 cm  LV E/e' medial:  5.7 LV IVS:        1.00 cm  LV e' lateral:   9.57 cm/s LVOT diam:     2.40 cm  LV E/e' lateral: 7.4 LV SV:         84 LV SV Index:   35 LVOT Area:     4.52 cm  RIGHT VENTRICLE             IVC RV S prime:     12.30 cm/s  IVC diam: 2.30 cm TAPSE (M-mode): 2.9 cm LEFT ATRIUM           Index        RIGHT ATRIUM           Index LA diam:      4.20 cm 1.74 cm/m  RA Area:     21.00 cm LA Vol (A4C): 43.8 ml 18.19 ml/m RA Volume:   63.80 ml  26.49 ml/m  AORTIC VALVE AV Area (Vmax):    3.99 cm AV Area (Vmean):   4.31 cm AV Area (VTI):     4.36 cm AV Vmax:           119.00 cm/s AV Vmean:          74.800 cm/s AV VTI:            0.192 m AV Peak Grad:      5.7 mmHg AV Mean Grad:      3.0 mmHg LVOT Vmax:         105.00 cm/s LVOT Vmean:        71.300 cm/s LVOT VTI:          0.185 m LVOT/AV VTI ratio: 0.96  AORTA Ao Root diam: 3.70 cm MITRAL VALVE MV Area (PHT): 3.65 cm    SHUNTS MV Decel Time: 208 msec    Systemic VTI:  0.18 m MV E velocity: 70.70 cm/s  Systemic Diam: 2.40 cm MV A velocity: 56.40 cm/s MV E/A ratio:  1.25 Christopher Him MD Electronically signed by Christopher Him MD Signature Date/Time: 12/01/2020/4:45:26 PM    Final       Assessment/Plan:  INTERVAL HISTORY: Patient underwent resection of his second toe  MRI of the hip is concerning for possible osteomyelitis in the superior pubic ramus as well as edema and enhancement of  the right abductor minimus   Active Problems:   Diabetic foot infection ([place])   Osteomyelitis ([place])   MRSA bacteremia   Right hip pain    Christopher Fernandez is a 50 y.o. male with with diabetes mellitus I saw in 2021 when he had a nonhealing wound and underwent a right transmetatarsal amputation which we treated with vancomycin cefepime and Flagyl in the past.  He was admitted with back pain and found on MRI to have T9-T10 discitis and osteomyelitis.  He also has clear necrotic infection involving his second toe.  Blood cultures have been positive for methicillin-resistant Staphylococcus aureus.  MRI of the foot shows evidence of osteomyelitis in the distal phalanx of the second toe as well as edema enhancement in the proximal middle phalange of the second toe and plantar surface of the head of the second metatarsal  There was also edema and enhancement in the head  and neck of the first metatarsal and medial and lateral sesamoids that were also working for osteomyelitis.  Vascular surgery were aware of MRI findings and Dr. Oneida Alar felt that there is no indication to intervene upon this site seen on the MRI at site of prior amputation but did amputate the second toe along with the metatarsal head.  I agree with obtaining TEE to better evaluate his valves as well as the pericardial effusion  Also agree that he is not need IR guided biopsy of his disc space as this procedure would be low yield and he undoubtedly has infection with MRSA there as well.  The MRI of the hip is concerning.  I do not see something that orthopedic surgery would intervene upon at this time but this area should be carefully monitored and if he develops worsening pain he should have a repeat MRI to look for pyomyositis.  NOTE I DID COME BACk and discussed findings with Fernandez. Informed im we would monitor and might need to repeat MRI and that the pubic ramus finding would mean more protracted oral antibiotics  Indeed has osteo of the pubic bone he may need quite protracted antibiotics  Would like to change Fernandez over to daptomycin for a bit greater safety when he does discharge to home.   Candidal esophagitis: This was seen on EGD we will continue fluconazole    Christopher Fernandez has an appointment on 12/20/2020 at [facility] is located in the [facility] at  [address] in [place].  [address], which is located to the left of the elevators.  Phone: [phone]  Fax: [phone]  [email or web address]   He will need to arrive 15 minutes piror to his appointment.  I spent greater than 35 minutes with the patient including greater than 50their care.% of time in face to face counsel of the patient and his wife and in coordination of his care.    LOS: 4 days   Alcide Evener 12/03/2020, 2:16  PM

## 2020-12-03 NOTE — Progress Notes (Signed)
Occupational Therapy Treatment Patient Details Name: Christopher Fernandez MRN: [id number] DOB: 1971/08/12 Today's Date: 12/03/2020    History of present illness Patient is a 50 y/o male who presented directly from [facility] clinic with back pain on 3/18 with concern for osteomyelitis T9-T10. MRI + for T9-T10 osteomyelitis. 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 PMH: chronic pancreatitis, R transmet amputation, alcohol use dependence, anxiety, GERD, diabetic neuropathy, BPH, HTN, DM   OT comments  Pt making good progress with functional goals. OT will continue to follow cutely to maximize level of function and safety  Follow Up Recommendations  No OT follow up    Equipment Recommendations  Other (comment) (reacher, LH bath sponge)    Recommendations for Other Services      Precautions / Restrictions Precautions Precautions: Fall Precaution Comments: log roll in/out of bed Required Braces or Orthoses: Other Brace Other Brace: darco shoe, WBAT with shoe donned per Dr. Nona Dell office (per PT, called 9:20 am on 3/22 to verify) Restrictions Weight Bearing Restrictions: No       Mobility Bed Mobility               General bed mobility comments: up in chair upon arrival    Transfers Overall transfer level: Needs assistance Equipment used: None;Quad cane Transfers: Sit to/from Stand Sit to Stand: Supervision         General transfer comment: for safety, pt reaching for environment with cane to steady once standing and ambulating to bathroom    Balance Overall balance assessment: Mild deficits observed, not formally tested                                         ADL either performed or assessed with clinical judgement   ADL Overall ADL's : Needs assistance/impaired                                             Vision Baseline Vision/History: Wears glasses Patient Visual Report: No change from baseline     Perception      Praxis      Cognition Arousal/Alertness: Awake/alert Behavior During Therapy: WFL for tasks assessed/performed Overall Cognitive Status: Within Functional Limits for tasks assessed                                          Exercises     Shoulder Instructions       General Comments      Pertinent Vitals/ Pain       Pain Assessment: Faces Pain Score: 3  Faces Pain Scale: Hurts little more Pain Location: L foot Pain Descriptors / Indicators: Sore;Operative site guarding;Discomfort Pain Intervention(s): Monitored during session;Premedicated before session;Repositioned  Home Living                                          Prior Functioning/Environment              Frequency  Min 2X/week        Progress Toward Goals  OT Goals(current goals can now be found in  the care plan section)  Progress towards OT goals: Progressing toward goals  Acute Rehab OT Goals Patient Stated Goal: less pain, go home  Plan Discharge plan remains appropriate    Co-evaluation                 AM-PAC OT "6 Clicks" Daily Activity     Outcome Measure   Help from another person eating meals?: None Help from another person taking care of personal grooming?: A Little Help from another person toileting, which includes using toliet, bedpan, or urinal?: A Little Help from another person bathing (including washing, rinsing, drying)?: A Little Help from another person to put on and taking off regular upper body clothing?: None Help from another person to put on and taking off regular lower body clothing?: A Little 6 Click Score: 20    End of Session Equipment Utilized During Treatment: Other (comment) (quad cane)  OT Visit Diagnosis: Unsteadiness on feet (R26.81);Pain Pain - Right/Left: Left Pain - part of body: Ankle and joints of foot   Activity Tolerance Patient tolerated treatment well   Patient Left in chair;with call bell/phone within  reach   Nurse Communication          Time: 7741-4239 OT Time Calculation (min): 21 min  Charges: OT General Charges $OT Visit: 1 Visit OT Treatments $Self Care/Home Management : 8-22 mins     Britt Bottom 12/03/2020, 2:26 PM

## 2020-12-03 NOTE — Progress Notes (Signed)
[facility]  Subjective  - still some numbness in leg from block   Objective 136/90 (!) 58 97.8 F (36.6 C) (Oral) 17 98%  Intake/Output Summary (Last 24 hours) at 12/03/2020 0751 Last data filed at 12/03/2020 0500 Gross per 24 hour  Intake 1365.53 ml  Output 1020 ml  Net 345.53 ml   Dressing dry  Bone culture no growth to date    Assessment/Planning: Will arrange follow up in 3-4 weeks for suture removal  Call if questions  Watchful waiting to prior left first toe site. He is receiving antibiotics.  Would only consider revisiting this if he has clinical signs of infection   Ruta Hinds 12/03/2020 7:51 AM --  Laboratory Lab Results: Recent Labs    12/01/20 0112 12/02/20 0302  WBC 10.6* 9.3  HGB 10.7* 10.7*  HCT 31.4* 32.9*  PLT 478* 474*   BMET Recent Labs    12/01/20 0112 12/02/20 0302  NA 131* 132*  K 3.7 4.3  CL 98 100  CO2 23 25  GLUCOSE 153* 289*  BUN 12 15  CREATININE 0.93 0.99  CALCIUM 8.4* 8.6*    COAG Lab Results  Component Value Date   INR 1.0 12/02/2020   INR 1.0 01/10/2020   INR 1.0 06/14/2019   No results found for: PTT

## 2020-12-03 NOTE — Progress Notes (Signed)
Pharmacy Antibiotic Note  Christopher Fernandez is a 50 y.o. male admitted on 11/29/2020 with bacteremia and concern for Osteo.  Pharmacy has been consulted for Daptomycin dosing.  Plan: Discontinue vancomycin Start Daptomycin 1000 mg IV every 24 hours Monitor clinical progress, cultures/sensitivities, renal function, abx plan    Height: 6\' 3"  (190.5 cm) Weight: 115.3 kg (254 lb 3.1 oz) IBW/kg (Calculated) : 84.5  Temp (24hrs), Avg:98 F (36.7 C), Min:97.7 F (36.5 C), Max:98.3 F (36.8 C)  Recent Labs  Lab 11/29/20 1814 11/30/20 0600 12/01/20 0112 12/02/20 0302 12/03/20 0746 12/03/20 0833 12/03/20 1643  WBC 8.5 10.7* 10.6* 9.3  --  8.2  --   CREATININE 1.13 0.99 0.93 0.99  --  0.83  --   VANCOTROUGH  --   --   --   --   --   --  10*  VANCOPEAK  --   --   --   --  27*  --   --     Estimated Creatinine Clearance: 144.2 mL/min (by C-G formula based on SCr of 0.83 mg/dL).    Allergies  Allergen Reactions  . Eggs Or Egg-Derived Products Rash  . Morphine And Related Other (See Comments)    Cant take because of pancreatitis  . Cocoa Rash    3/19 Vanc >>3/22 3/19 Zosyn >> 3/20 3/20 Cefepime >> 3/21 3/20 Metronidazole >> 3/21 3/18 PTA Fluconazole 100 >> 3/20 3/20 Fluconazole 200 >> 3/22 Daptomycin >>   3/18BCx: MRSA PCR 3/18UCx: neg,F 12/23MRSA PCR: positive 3/20 Bcx: Ng x 2d 3/21 Aerobic/Anaerobic cx: pending    Thank you for allowing [place] to participate in this patients care. Jens Som, PharmD 12/03/2020 7:55 PM  Please check [email or web address] for unit-specific pharmacy phone numbers.

## 2020-12-03 NOTE — Progress Notes (Signed)
Physical Therapy Treatment Patient Details Name: Christopher Fernandez MRN: [id number] DOB: [birth year] Today's Date: 12/03/2020    History of Present Illness Patient is a 50 y/o male who presented directly from [facility] clinic with back pain on 3/18 with concern for osteomyelitis T9-T10. MRI + for T9-T10 osteomyelitis. 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 PMH: chronic pancreatitis, R transmet amputation, alcohol use dependence, anxiety, GERD, diabetic neuropathy, BPH, HTN, DM    PT Comments    Pt endorses recently having worked with OT, but is agreeable to gait training s/p 2nd toe amputation. Pt ambulatory in hallway with use of quad cane, demonstrates unsteadiness especially when attempting to take too large of steps or progress to step-through gait. PT educated pt on not rocking forward onto front portion of foot, to avoid WB through area of amputation which is aided by use of darco shoe. PT to continue to follow acutely.    Follow Up Recommendations  No PT follow up     Equipment Recommendations  None recommended by PT    Recommendations for Other Services       Precautions / Restrictions Precautions Precautions: Fall Precaution Comments: log roll in/out of bed Required Braces or Orthoses: Other Brace Other Brace: darco shoe, WBAT with shoe donned per Dr. Nona Dell office (PT called 9:20 am on 3/22 to verify) Restrictions Weight Bearing Restrictions: No    Mobility  Bed Mobility               General bed mobility comments: up in chair upon PT arrival to room    Transfers Overall transfer level: Needs assistance Equipment used: None Transfers: Sit to/from Stand Sit to Stand: Supervision         General transfer comment: for safety, pt reaching for environment and cane to steady once standing.  Ambulation/Gait Ambulation/Gait assistance: Min guard Gait Distance (Feet): 75 Feet Assistive device: Quad cane Gait Pattern/deviations: Wide base of  support;Step-to pattern;Decreased step length - right;Decreased step length - left;Decreased weight shift to right     General Gait Details: min guard for safety, pt mildly unsteady especially when taking too large of steps or attempting step-through gait. PT encouraged step-to gait and shorter step length for steadiness and to avoid rocking onto front portion of foot.   Stairs             Wheelchair Mobility    Modified Rankin (Stroke Patients Only)       Balance Overall balance assessment: Mild deficits observed, not formally tested                                          Cognition Arousal/Alertness: Awake/alert Behavior During Therapy: WFL for tasks assessed/performed Overall Cognitive Status: Within Functional Limits for tasks assessed                                        Exercises      General Comments        Pertinent Vitals/Pain Pain Assessment: 0-10 Pain Score: 3  Pain Location: R foot Pain Descriptors / Indicators: Sore;Operative site guarding;Discomfort Pain Intervention(s): Limited activity within patient's tolerance;Monitored during session;Repositioned;Premedicated before session    Home Living  Prior Function            PT Goals (current goals can now be found in the care plan section) Acute Rehab PT Goals Patient Stated Goal: to reduce pain PT Goal Formulation: With patient Time For Goal Achievement: 12/14/20 Potential to Achieve Goals: Good Progress towards PT goals: Progressing toward goals    Frequency    Min 5X/week      PT Plan Current plan remains appropriate    Co-evaluation              AM-PAC PT "6 Clicks" Mobility   Outcome Measure  Help needed turning from your back to your side while in a flat bed without using bedrails?: None Help needed moving from lying on your back to sitting on the side of a flat bed without using bedrails?: None Help  needed moving to and from a bed to a chair (including a wheelchair)?: A Little Help needed standing up from a chair using your arms (e.g., wheelchair or bedside chair)?: A Little Help needed to walk in hospital room?: A Little Help needed climbing 3-5 steps with a railing? : A Little 6 Click Score: 20    End of Session Equipment Utilized During Treatment: Gait belt Activity Tolerance: Patient tolerated treatment well Patient left: in chair;with call bell/phone within reach - Pt states he will press call button and wait for assist prior to mobilizing back to bed.  Nurse Communication: Mobility status PT Visit Diagnosis: Unsteadiness on feet (R26.81);Muscle weakness (generalized) (M62.81)     Time: 9417-4081 PT Time Calculation (min) (ACUTE ONLY): 9 min  Charges:  $Gait Training: 8-22 mins                     Stacie Glaze, PT Acute Rehabilitation Services Pager [phone]  Office [phone]    [place] 12/03/2020, 1:20 PM

## 2020-12-04 ENCOUNTER — Inpatient Hospital Stay (HOSPITAL_COMMUNITY): Payer: 59

## 2020-12-04 ENCOUNTER — Encounter (HOSPITAL_COMMUNITY): Payer: Self-pay | Admitting: Vascular Surgery

## 2020-12-04 ENCOUNTER — Inpatient Hospital Stay (HOSPITAL_COMMUNITY): Payer: 59 | Admitting: Certified Registered"

## 2020-12-04 ENCOUNTER — Encounter (HOSPITAL_COMMUNITY)
Admission: AD | Disposition: A | Discharge status: Home Health | Payer: Self-pay | Source: Ambulatory Visit | Attending: Family Medicine

## 2020-12-04 DIAGNOSIS — R109 Unspecified abdominal pain: Secondary | ICD-10-CM | POA: Diagnosis not present

## 2020-12-04 DIAGNOSIS — R945 Abnormal results of liver function studies: Secondary | ICD-10-CM

## 2020-12-04 DIAGNOSIS — R7881 Bacteremia: Secondary | ICD-10-CM

## 2020-12-04 DIAGNOSIS — K831 Obstruction of bile duct: Secondary | ICD-10-CM

## 2020-12-04 DIAGNOSIS — R1011 Right upper quadrant pain: Secondary | ICD-10-CM | POA: Diagnosis not present

## 2020-12-04 DIAGNOSIS — E11628 Type 2 diabetes mellitus with other skin complications: Secondary | ICD-10-CM | POA: Diagnosis not present

## 2020-12-04 DIAGNOSIS — M86151 Other acute osteomyelitis, right femur: Secondary | ICD-10-CM | POA: Diagnosis not present

## 2020-12-04 HISTORY — PX: TEE WITHOUT CARDIOVERSION: SHX5443

## 2020-12-04 LAB — COMPREHENSIVE METABOLIC PANEL
ALT: 150 U/L — ABNORMAL HIGH (ref 0–44)
AST: 80 U/L — ABNORMAL HIGH (ref 15–41)
Albumin: 2.4 g/dL — ABNORMAL LOW (ref 3.5–5.0)
Alkaline Phosphatase: 212 U/L — ABNORMAL HIGH (ref 38–126)
Anion gap: 5 (ref 5–15)
BUN: 11 mg/dL (ref 6–20)
CO2: 28 mmol/L (ref 22–32)
Calcium: 8.8 mg/dL — ABNORMAL LOW (ref 8.9–10.3)
Chloride: 102 mmol/L (ref 98–111)
Creatinine, Ser: 0.78 mg/dL (ref 0.61–1.24)
GFR, Estimated: >60 mL/min (ref >60)
Glucose, Bld: 174 mg/dL — ABNORMAL HIGH (ref 70–99)
Potassium: 4.3 mmol/L (ref 3.5–5.1)
Sodium: 135 mmol/L (ref 135–145)
Total Bilirubin: 0.6 mg/dL (ref 0.3–1.2)
Total Protein: 7 g/dL (ref 6.5–8.1)

## 2020-12-04 LAB — GLUCOSE, CAPILLARY
Glucose-Capillary: 164 mg/dL — ABNORMAL HIGH (ref 70–99)
Glucose-Capillary: 158 mg/dL — ABNORMAL HIGH (ref 70–99)
Glucose-Capillary: 113 mg/dL — ABNORMAL HIGH (ref 70–99)
Glucose-Capillary: 108 mg/dL — ABNORMAL HIGH (ref 70–99)
Glucose-Capillary: 215 mg/dL — ABNORMAL HIGH (ref 70–99)

## 2020-12-04 LAB — CBC
HCT: 32.7 % — ABNORMAL LOW (ref 39.0–52.0)
Hemoglobin: 10.6 g/dL — ABNORMAL LOW (ref 13.0–17.0)
MCH: 28.3 pg (ref 26.0–34.0)
MCHC: 32.4 g/dL (ref 30.0–36.0)
MCV: 87.4 fL (ref 80.0–100.0)
Platelets: 546 10*3/uL — ABNORMAL HIGH (ref 150–400)
RBC: 3.74 MIL/uL — ABNORMAL LOW (ref 4.22–5.81)
RDW: 13.9 % (ref 11.5–15.5)
WBC: 7.3 10*3/uL (ref 4.0–10.5)
nRBC: 0 % (ref 0.0–0.2)

## 2020-12-04 SURGERY — ECHOCARDIOGRAM, TRANSESOPHAGEAL
Anesthesia: Monitor Anesthesia Care

## 2020-12-04 MED ORDER — SODIUM CHLORIDE 0.9 % IV SOLN: INTRAVENOUS | Status: DC | PRN

## 2020-12-04 MED ORDER — FENTANYL CITRATE (PF) 100 MCG/2ML IJ SOLN
INTRAMUSCULAR | Status: DC | PRN
  Administered 2020-12-04 (×2): 50 ug via INTRAVENOUS

## 2020-12-04 MED ORDER — PROPOFOL 500 MG/50ML IV EMUL
INTRAVENOUS | Status: DC | PRN
  Administered 2020-12-04: 150 ug/kg/min via INTRAVENOUS

## 2020-12-04 MED ORDER — LIDOCAINE 2% (20 MG/ML) 5 ML SYRINGE
INTRAMUSCULAR | Status: DC | PRN
  Administered 2020-12-04: 100 mg via INTRAVENOUS

## 2020-12-04 MED ORDER — KETOROLAC TROMETHAMINE 30 MG/ML IJ SOLN
30.0000 mg | Freq: Once | INTRAMUSCULAR | Status: AC
  Administered 2020-12-04: 30 mg via INTRAVENOUS
  Filled 2020-12-04: qty 1

## 2020-12-04 MED ORDER — ACETAMINOPHEN 500 MG PO TABS
1000.0000 mg | ORAL_TABLET | Freq: Four times a day (QID) | ORAL | Status: DC | PRN
  Administered 2020-12-05: 1000 mg via ORAL
  Filled 2020-12-04: qty 2

## 2020-12-04 MED ORDER — GLYCOPYRROLATE PF 0.2 MG/ML IJ SOSY
PREFILLED_SYRINGE | INTRAMUSCULAR | Status: DC | PRN
  Administered 2020-12-04: .1 mg via INTRAVENOUS

## 2020-12-04 MED ORDER — PROPOFOL 10 MG/ML IV BOLUS
INTRAVENOUS | Status: DC | PRN
  Administered 2020-12-04: 20 mg via INTRAVENOUS

## 2020-12-04 NOTE — Progress Notes (Signed)
  Echocardiogram Echocardiogram Transesophageal has been performed.  Christopher Fernandez 12/04/2020, 2:05 PM

## 2020-12-04 NOTE — Anesthesia Postprocedure Evaluation (Signed)
Anesthesia Post Note  Patient: Christopher Fernandez  Procedure(s) Performed: TRANSESOPHAGEAL ECHOCARDIOGRAM (TEE) (N/A )     Patient location during evaluation: Endoscopy Anesthesia Type: MAC Level of consciousness: awake and sedated Pain management: pain level controlled Vital Signs Assessment: post-procedure vital signs reviewed and stable Respiratory status: spontaneous breathing Cardiovascular status: stable Postop Assessment: no apparent nausea or vomiting Anesthetic complications: no   No complications documented.  Last Vitals:  Vitals:   12/04/20 1414 12/04/20 1422  BP: (!) 178/93 (!) 152/86  Pulse:  64  Resp:  (!) 9  Temp:    SpO2:  98%    Last Pain:  Vitals:   12/04/20 1422  TempSrc:   PainSc: 4                  John F Salome Arnt

## 2020-12-04 NOTE — Progress Notes (Addendum)
Subjective:  No new complaints he was in bath room cleaning himself when I visited him   Antibiotics:  Anti-infectives (From admission, onward)   Start     Dose/Rate Route Frequency Ordered Stop   12/03/20 2100  DAPTOmycin (CUBICIN) 1,000 mg in sodium chloride 0.9 % IVPB        1,000 mg 240 mL/hr over 30 Minutes Intravenous Daily 12/03/20 1950     12/02/20 1630  ceFEPIme (MAXIPIME) 2 g in sodium chloride 0.9 % 100 mL IVPB  Status:  Discontinued        2 g 200 mL/hr over 30 Minutes Intravenous  Once 12/02/20 1609 12/02/20 1850   12/01/20 1845  fluconazole (DIFLUCAN) tablet 200 mg        200 mg Oral Daily 12/01/20 1753 12/18/20 2359   12/01/20 1400  ceFEPIme (MAXIPIME) 2 g in sodium chloride 0.9 % 100 mL IVPB  Status:  Discontinued        2 g 200 mL/hr over 30 Minutes Intravenous Every 8 hours 12/01/20 1007 12/02/20 0910   12/01/20 1045  metroNIDAZOLE (FLAGYL) tablet 500 mg  Status:  Discontinued        500 mg Oral Every 12 hours 12/01/20 0951 12/02/20 0910   12/01/20 0500  vancomycin (VANCOREADY) IVPB 1250 mg/250 mL  Status:  Discontinued        1,250 mg 166.7 mL/hr over 90 Minutes Intravenous Every 12 hours 11/30/20 1641 12/03/20 1918   11/30/20 1645  vancomycin (VANCOREADY) IVPB 1500 mg/300 mL        1,500 mg 150 mL/hr over 120 Minutes Intravenous  Once 11/30/20 1556 11/30/20 1925   11/30/20 1645  piperacillin-tazobactam (ZOSYN) IVPB 3.375 g  Status:  Discontinued        3.375 g 12.5 mL/hr over 240 Minutes Intravenous Every 8 hours 11/30/20 1556 12/01/20 0951   11/30/20 1000  fluconazole (DIFLUCAN) tablet 100 mg  Status:  Discontinued        100 mg Oral Daily 11/29/20 1916 11/29/20 1952      Medications: Scheduled Meds: . ARIPiprazole  5 mg Oral QPM  . Chlorhexidine Gluconate Cloth  6 each Topical Q0600  . DULoxetine  60 mg Oral Daily  . enoxaparin (LOVENOX) injection  60 mg Subcutaneous Q24H  . fluconazole  200 mg Oral Daily  . gabapentin  600 mg Oral TID  .  insulin aspart  0-9 Units Subcutaneous TID WC  . lidocaine  1 patch Transdermal Q24H  . lipase/protease/amylase  36,000 Units Oral TID WC  . mirtazapine  30 mg Oral QHS  . mometasone-formoterol  2 puff Inhalation BID  . montelukast  10 mg Oral q AM  . mupirocin ointment  1 application Nasal BID  . pantoprazole  40 mg Oral Daily  . propranolol ER  80 mg Oral Daily   Continuous Infusions: . DAPTOmycin (CUBICIN)  IV 1,000 mg (12/04/20 1952)   PRN Meds:.acetaminophen, albuterol, ketorolac, oxyCODONE    Objective: Weight change:   Intake/Output Summary (Last 24 hours) at 12/04/2020 2057 Last data filed at 12/04/2020 1357 Gross per 24 hour  Intake 680 ml  Output 800 ml  Net -120 ml   Blood pressure 115/72, pulse 74, temperature 98.3 F (36.8 C), resp. rate 18, height 6\' 3"  (1.905 m), weight 115.3 kg, SpO2 95 %. Temp:  [97.8 F (36.6 C)-98.7 F (37.1 C)] 98.3 F (36.8 C) (03/23 1940) Pulse Rate:  [59-78] 74 (03/23 1940) Resp:  [8-18] 18 (  03/23 1940) BP: (115-185)/(71-101) 115/72 (03/23 1940) SpO2:  [90 %-100 %] 95 % (03/23 1940) Weight:  [115.3 kg] 115.3 kg (03/23 1315)  Physical Exam:  Patient cleaning himself in rest room  CBC:    BMET Recent Labs    12/03/20 0833 12/04/20 0907  NA 136 135  K 4.2 4.3  CL 103 102  CO2 26 28  GLUCOSE 177* 174*  BUN 12 11  CREATININE 0.83 0.78  CALCIUM 9.0 8.8*     Liver Panel  Recent Labs    12/04/20 0907  PROT 7.0  ALBUMIN 2.4*  AST 80*  ALT 150*  ALKPHOS 212*  BILITOT 0.6       Sedimentation Rate No results for input(s): ESRSEDRATE in the last 72 hours. C-Reactive Protein No results for input(s): CRP in the last 72 hours.  Micro Results: Recent Results (from the past 720 hour(s))  Culture, blood (routine x 2)     Status: Abnormal   Collection Time: 11/29/20  7:37 PM   Specimen: BLOOD LEFT ARM  Result Value Ref Range Status   Specimen Description BLOOD LEFT ARM  Final   Special Requests   Final     BOTTLES DRAWN AEROBIC ONLY Blood Culture adequate volume   Culture  Setup Time   Final    GRAM POSITIVE COCCI IN CLUSTERS AEROBIC BOTTLE ONLY CRITICAL RESULT CALLED TO, READ BACK BY AND VERIFIED WITH: L,CHEN PHARMD @1951  11/30/20 EB Performed at [facility], [place] [address] [place], [place] [address]    Culture METHICILLIN RESISTANT STAPHYLOCOCCUS AUREUS (A)  Final   Report Status 12/02/2020 FINAL  Final   Organism ID, Bacteria METHICILLIN RESISTANT STAPHYLOCOCCUS AUREUS  Final      Susceptibility   Methicillin resistant staphylococcus aureus - MIC*    CIPROFLOXACIN <=0.5 SENSITIVE Sensitive     ERYTHROMYCIN >=8 RESISTANT Resistant     GENTAMICIN <=0.5 SENSITIVE Sensitive     OXACILLIN >=4 RESISTANT Resistant     TETRACYCLINE 2 SENSITIVE Sensitive     VANCOMYCIN 1 SENSITIVE Sensitive     TRIMETH/SULFA <=10 SENSITIVE Sensitive     CLINDAMYCIN <=0.25 SENSITIVE Sensitive     RIFAMPIN <=0.5 SENSITIVE Sensitive     Inducible Clindamycin NEGATIVE Sensitive     * METHICILLIN RESISTANT STAPHYLOCOCCUS AUREUS  Culture, blood (routine x 2)     Status: Abnormal   Collection Time: 11/29/20  7:37 PM   Specimen: BLOOD LEFT ARM  Result Value Ref Range Status   Specimen Description BLOOD LEFT ARM  Final   Special Requests   Final    BOTTLES DRAWN AEROBIC ONLY Blood Culture adequate volume   Culture  Setup Time   Final    GRAM POSITIVE COCCI IN CLUSTERS AEROBIC BOTTLE ONLY CRITICAL VALUE NOTED.  VALUE IS CONSISTENT WITH PREVIOUSLY REPORTED AND CALLED VALUE.    Culture (A)  Final    STAPHYLOCOCCUS AUREUS SUSCEPTIBILITIES PERFORMED ON PREVIOUS CULTURE WITHIN THE LAST 5 DAYS. Performed at [facility], [place] [address], [place], [place] [address]    Report Status 12/02/2020 FINAL  Final  Blood Culture ID Panel (Reflexed)     Status: Abnormal   Collection Time: 11/29/20  7:37 PM  Result Value Ref Range Status   Enterococcus faecalis NOT DETECTED NOT DETECTED Final   Enterococcus  Faecium NOT DETECTED NOT DETECTED Final   Listeria monocytogenes NOT DETECTED NOT DETECTED Final   Staphylococcus species DETECTED (A) NOT DETECTED Final    Comment: CRITICAL RESULT CALLED TO,  READ BACK BY AND VERIFIED WITH: L,CHEN PHARMD @1951  11/30/20 EB    Staphylococcus aureus (BCID) DETECTED (A) NOT DETECTED Final    Comment: Methicillin (oxacillin)-resistant Staphylococcus aureus (MRSA). MRSA is predictably resistant to beta-lactam antibiotics (except ceftaroline). Preferred therapy is vancomycin unless clinically contraindicated. Patient requires contact precautions if  hospitalized. CRITICAL RESULT CALLED TO, READ BACK BY AND VERIFIED WITH: L,CHEN PHARMD @1951  11/30/20 EB    Staphylococcus epidermidis NOT DETECTED NOT DETECTED Final   Staphylococcus lugdunensis NOT DETECTED NOT DETECTED Final   Streptococcus species NOT DETECTED NOT DETECTED Final   Streptococcus agalactiae NOT DETECTED NOT DETECTED Final   Streptococcus pneumoniae NOT DETECTED NOT DETECTED Final   Streptococcus pyogenes NOT DETECTED NOT DETECTED Final   A.calcoaceticus-baumannii NOT DETECTED NOT DETECTED Final   Bacteroides fragilis NOT DETECTED NOT DETECTED Final   Enterobacterales NOT DETECTED NOT DETECTED Final   Enterobacter cloacae complex NOT DETECTED NOT DETECTED Final   Escherichia coli NOT DETECTED NOT DETECTED Final   Klebsiella aerogenes NOT DETECTED NOT DETECTED Final   Klebsiella oxytoca NOT DETECTED NOT DETECTED Final   Klebsiella pneumoniae NOT DETECTED NOT DETECTED Final   Proteus species NOT DETECTED NOT DETECTED Final   Salmonella species NOT DETECTED NOT DETECTED Final   Serratia marcescens NOT DETECTED NOT DETECTED Final   Haemophilus influenzae NOT DETECTED NOT DETECTED Final   Neisseria meningitidis NOT DETECTED NOT DETECTED Final   Pseudomonas aeruginosa NOT DETECTED NOT DETECTED Final   Stenotrophomonas maltophilia NOT DETECTED NOT DETECTED Final   Candida albicans NOT DETECTED NOT  DETECTED Final   Candida auris NOT DETECTED NOT DETECTED Final   Candida glabrata NOT DETECTED NOT DETECTED Final   Candida krusei NOT DETECTED NOT DETECTED Final   Candida parapsilosis NOT DETECTED NOT DETECTED Final   Candida tropicalis NOT DETECTED NOT DETECTED Final   Cryptococcus neoformans/gattii NOT DETECTED NOT DETECTED Final   Meth resistant mecA/C and MREJ DETECTED (A) NOT DETECTED Final    Comment: CRITICAL RESULT CALLED TO, READ BACK BY AND VERIFIED WITH: L,CHEN PHARMD @1951  11/30/20 EB Performed at [facility], [address], [place], [place] [address]   Culture, Urine     Status: None   Collection Time: 11/29/20  9:25 PM   Specimen: Urine, Random  Result Value Ref Range Status   Specimen Description URINE, RANDOM  Final   Special Requests NONE  Final   Culture   Final    NO GROWTH Performed at [facility], [place] [address] [place], [place] [address]    Report Status 12/01/2020 FINAL  Final  Culture, blood (routine x 2)     Status: None (Preliminary result)   Collection Time: 12/01/20 10:16 AM   Specimen: BLOOD LEFT HAND  Result Value Ref Range Status   Specimen Description BLOOD LEFT HAND  Final   Special Requests   Final    BOTTLES DRAWN AEROBIC AND ANAEROBIC Blood Culture results may not be optimal due to an inadequate volume of blood received in culture bottles   Culture   Final    NO GROWTH 3 DAYS Performed at [facility], [place] [address], [place],  [address]    Report Status PENDING  Incomplete  Culture, blood (routine x 2)     Status: Abnormal (Preliminary result)   Collection Time: 12/01/20 10:23 AM   Specimen: BLOOD RIGHT HAND  Result Value Ref Range Status   Specimen Description BLOOD RIGHT HAND  Final   Special Requests  Final    BOTTLES DRAWN AEROBIC AND ANAEROBIC Blood Culture adequate volume   Culture  Setup Time   Final    ANAEROBIC BOTTLE ONLY GRAM POSITIVE COCCI CRITICAL VALUE NOTED.  VALUE IS CONSISTENT WITH  PREVIOUSLY REPORTED AND CALLED VALUE.    Culture (A)  Final    STAPHYLOCOCCUS AUREUS SUSCEPTIBILITIES PERFORMED ON PREVIOUS CULTURE WITHIN THE LAST 5 DAYS. Performed at [facility], [place] [address] [place], [place] [address]    Report Status PENDING  Incomplete  Surgical pcr screen     Status: Abnormal   Collection Time: 12/01/20 10:15 PM   Specimen: Nasal Mucosa; Nasal Swab  Result Value Ref Range Status   MRSA, PCR POSITIVE (A) NEGATIVE Final    Comment: RESULT CALLED TO, READ BACK BY AND VERIFIED WITH: ROGERS,P RN 0001 12/02/2020 MITCHELL,L    Staphylococcus aureus POSITIVE (A) NEGATIVE Final    Comment: (NOTE) The Xpert SA Assay (FDA approved for NASAL specimens in patients 71 years of age and older), is one component of a comprehensive surveillance program. It is not intended to diagnose infection nor to guide or monitor treatment. Performed at [facility], [place] [address], [place], [place] [address]   Resp Panel by RT-PCR (Flu A&B, Covid) Nasopharyngeal Swab     Status: None   Collection Time: 12/02/20  8:15 AM   Specimen: Nasopharyngeal Swab; Nasopharyngeal(NP) swabs in vial transport medium  Result Value Ref Range Status   SARS Coronavirus 2 by RT PCR NEGATIVE NEGATIVE Final    Comment: (NOTE) SARS-CoV-2 target nucleic acids are NOT DETECTED.  The SARS-CoV-2 RNA is generally detectable in upper respiratory specimens during the acute phase of infection. The lowest concentration of SARS-CoV-2 viral copies this assay can detect is 138 copies/mL. A negative result does not preclude SARS-Cov-2 infection and should not be used as the sole basis for treatment or other patient management decisions. A negative result may occur with  improper specimen collection/handling, submission of specimen other than nasopharyngeal swab, presence of viral mutation(s) within the areas targeted by this assay, and inadequate number of viral copies(<138 copies/mL). A negative  result must be combined with clinical observations, patient history, and epidemiological information. The expected result is Negative.  Fact Sheet for Patients:  EntrepreneurPulse.com.au  Fact Sheet for Healthcare Providers:  IncredibleEmployment.be  This test is no t yet approved or cleared by the [place] FDA and  has been authorized for detection and/or diagnosis of SARS-CoV-2 by FDA under an Emergency Use Authorization (EUA). This EUA will remain  in effect (meaning this test can be used) for the duration of the COVID-19 declaration under Section 564(b)(1) of the Act, 21 U.S.C.section 360bbb-3(b)(1), unless the authorization is terminated  or revoked sooner.       Influenza A by PCR NEGATIVE NEGATIVE Final   Influenza B by PCR NEGATIVE NEGATIVE Final    Comment: (NOTE) The Xpert Xpress SARS-CoV-2/FLU/RSV plus assay is intended as an aid in the diagnosis of influenza from Nasopharyngeal swab specimens and should not be used as a sole basis for treatment. Nasal washings and aspirates are unacceptable for Xpert Xpress SARS-CoV-2/FLU/RSV testing.  Fact Sheet for Patients: EntrepreneurPulse.com.au  Fact Sheet for Healthcare Providers: IncredibleEmployment.be  This test is not yet approved or cleared by the [place] FDA and has been authorized for detection and/or diagnosis of SARS-CoV-2 by FDA under an Emergency Use Authorization (EUA). This EUA will remain in effect (meaning this test can be used) for the duration  of the COVID-19 declaration under Section 564(b)(1) of the Act, 21 U.S.C. section 360bbb-3(b)(1), unless the authorization is terminated or revoked.  Performed at [facility], [place] [address] [place], [place] [address]   Aerobic/Anaerobic Culture w Gram Stain (surgical/deep wound)     Status: None (Preliminary result)   Collection Time: 12/02/20  4:34 PM   Specimen: PATH  Digit amputation; Tissue  Result Value Ref Range Status   Specimen Description TISSUE LEFT TOE  Final   Special Requests PATIENT ON FOLLOWING CEFEPIME MAXIPIME VANC  Final   Gram Stain   Final    RARE WBC PRESENT, PREDOMINANTLY PMN NO ORGANISMS SEEN    Culture   Final    NO GROWTH 2 DAYS NO ANAEROBES ISOLATED; CULTURE IN PROGRESS FOR 5 DAYS Performed at [facility], [place] [address] [place], [place] [address]    Report Status PENDING  Incomplete    Studies/Results: ECHO TEE  Result Date: 12/04/2020    TRANSESOPHOGEAL ECHO REPORT   Patient Name:   Christopher Fernandez Date of Exam: 12/04/2020 Medical Rec #:  [id number]      Height:       75.0 in Accession #:    [id number]     Weight:       254.2 lb Date of Birth:  1970-09-29       BSA:          2.430 m Patient Age:    50 years       BP:           122/71 mmHg Patient Gender: M              HR:           78 bpm. Exam Location:  Inpatient Procedure: Transesophageal Echo, Cardiac Doppler and Color Doppler Indications:     Bacteremia  History:         Patient has prior history of Echocardiogram examinations, most                  recent 12/01/2020. Risk Factors:Diabetes. MRSA.  Sonographer:     Dustin Flock Referring Phys:  [phone] Karoline Caldwell Diagnosing Phys: [name] MD PROCEDURE: The transesophogeal probe was passed without difficulty through the esophogus of the patient. Sedation performed by performing physician. The patient was monitored while under deep sedation. Anesthestetic sedation was provided intravenously by  Anesthesiology: 244.84mg  of Propofol, 100mg  of Lidocaine. The patient developed no complications during the procedure. IMPRESSIONS  1. Left ventricular ejection fraction, by estimation, is 55 to 60%. The left ventricle has normal function. The left ventricle has no regional wall motion abnormalities.  2. Right ventricular systolic function is normal. The right ventricular size is normal.  3. No left atrial/left atrial appendage  thrombus was detected.  4. The mitral valve is normal in structure. Trivial mitral valve regurgitation.  5. The aortic valve is normal in structure. Aortic valve regurgitation is not visualized. FINDINGS  Left Ventricle: Left ventricular ejection fraction, by estimation, is 55 to 60%. The left ventricle has normal function. The left ventricle has no regional wall motion abnormalities. The left ventricular internal cavity size was normal in size. Right Ventricle: The right ventricular size is normal. No increase in right ventricular wall thickness. Right ventricular systolic function is normal. Left Atrium: Left atrial size was normal in size. No left atrial/left atrial appendage thrombus was detected. Right Atrium: Right atrial size was normal in size. Pericardium: There is no evidence  of pericardial effusion. Mitral Valve: The mitral valve is normal in structure. Trivial mitral valve regurgitation. There is no evidence of mitral valve vegetation. Tricuspid Valve: The tricuspid valve is grossly normal. Tricuspid valve regurgitation is mild. Aortic Valve: The aortic valve is normal in structure. Aortic valve regurgitation is not visualized. There is no evidence of aortic valve vegetation. Pulmonic Valve: The pulmonic valve was normal in structure. Pulmonic valve regurgitation is trivial. Aorta: The aortic root and ascending aorta are structurally normal, with no evidence of dilitation. IAS/Shunts: No atrial level shunt detected by color flow Doppler. [name] MD Electronically signed by [name] MD Signature Date/Time: 12/04/2020/3:22:28 PM    Final    US Abdomen Limited RUQ (LIVER/GB)  Result Date: 12/04/2020 CLINICAL DATA:  Abdominal pain, history COPD, diabetes mellitus, hypertension EXAM: ULTRASOUND ABDOMEN LIMITED RIGHT UPPER QUADRANT COMPARISON:  CT abdomen pelvis 10/23/2020 FINDINGS: Gallbladder: Surgically absent Common bile duct: Diameter: 19 mm diameter, dilated, increased from the 14 mm on the  prior CT. No definite common duct stones. Liver: Normal parenchymal echogenicity. No mass or nodularity. Intrahepatic biliary dilatation present. Portal vein is patent on color Doppler imaging with normal direction of blood flow towards the liver. Other: No RIGHT upper quadrant free fluid. IMPRESSION: Post cholecystectomy. Intrahepatic and extrahepatic biliary dilatation with CBD now 19 mm, increased since 10/23/2020; recommend correlation with LFTs. Electronically Signed   By: Lavonia Dana M.D.   On: 12/04/2020 11:54      Assessment/Plan:  INTERVAL HISTORY:  Since I saw him this am he had TEE, he is also to have ERCP   Active Problems:   Diabetic foot infection ([place])   Osteomyelitis ([place])   MRSA bacteremia   Right hip pain   Infective myositis of right lower extremity   Acute osteomyelitis of right pelvic region and thigh (HCC)   Discitis of thoracolumbar region   Pericardial effusion   Thrush   Bacteremia   Abdominal pain    Christopher Fernandez is a 51 y.o. male with with diabetes mellitus I saw in 2021 when he had a nonhealing wound and underwent a right transmetatarsal amputation which we treated with vancomycin cefepime and Flagyl in the past.  He was admitted with back pain and found on MRI to have T9-T10 discitis and osteomyelitis.  He also has clear necrotic infection involving his second toe.  Blood cultures have been positive for methicillin-resistant Staphylococcus aureus.  MRI of the foot shows evidence of osteomyelitis in the distal phalanx of the second toe as well as edema enhancement in the proximal middle phalange of the second toe and plantar surface of the head of the second metatarsal  There was also edema and enhancement in the head and neck of the first metatarsal and medial and lateral sesamoids that were also working for osteomyelitis.  Vascular surgery were aware of MRI findings and Dr. Oneida Alar felt that there is no indication to intervene upon this site seen on  the MRI at site of prior amputation but did amputate the second toe along with the metatarsal head.  TEE is negative for vegetations I do not see comment on effusion  Also agree that he is not need IR guided biopsy of his disc space as this procedure would be low yield and he undoubtedly has infection with MRSA there as well.  The MRI of the hip is concerning.  I do not see something that orthopedic surgery would intervene upon at this time but this area should be carefully  monitored and if he develops worsening pain he should have a repeat MRI to look for pyomyositis.  NOTE I DID COME BACk yesterday  discussed findings with him. 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 He may meet deductible soon which also might help   Candidal esophagitis: This was seen on EGD we will continue fluconazole  Bile duct dilation that is progressive; he is going to have ERCP with GI   Christopher Fernandez has an appointment on 12/20/2020 at [facility] for [facility] Disease is located in the [facility] at  [address] in [place].  [address], which is located to the left of the elevators.  Phone: [phone]  Fax: [phone]  [email or web address]   He will need to arrive 15 minutes piror to his appointment.     LOS: 5 days   Alcide Evener 12/04/2020, 8:57 PM

## 2020-12-04 NOTE — Transfer of Care (Signed)
Immediate Anesthesia Transfer of Care Note  Patient: Christopher Fernandez  Procedure(s) Performed: TRANSESOPHAGEAL ECHOCARDIOGRAM (TEE) (N/A )  Patient Location: Endoscopy Unit  Anesthesia Type:General  Level of Consciousness: drowsy  Airway & Oxygen Therapy: Patient Spontanous Breathing and Patient connected to nasal cannula oxygen  Post-op Assessment: Report given to RN and Post -op Vital signs reviewed and stable  Post vital signs: Reviewed and stable  Last Vitals:  Vitals Value Taken Time  BP 122/71 12/04/20 1400  Temp    Pulse 76 12/04/20 1401  Resp 22 12/04/20 1401  SpO2 100 % 12/04/20 1401  Vitals shown include unvalidated device data.  Last Pain:  Vitals:   12/04/20 1315  TempSrc:   PainSc: 8       Patients Stated Pain Goal: 3 (65/99/35 7017)  Complications: No complications documented.

## 2020-12-04 NOTE — Plan of Care (Signed)

## 2020-12-04 NOTE — Consult Note (Addendum)
[facility] Consult: 2:21 PM 12/04/2020  LOS: 5 days    Referring Provider: Dr Andria Frames  Primary Care Physician:  Danna Hefty, DO Primary Gastroenterologist:  Dr Loletha Carrow     Reason for Consultation:   Choledocholithiasis.   HPI: Christopher Fernandez is a 50 y.o. male.  PMH COPD.  IDDM.  Peripheral neuropathy.  Alcoholism.  Alcoholic pancreatitis.  GERD.  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  S/p cholecystectomy. Osteomyelitis, S/p transmetatarsal and toe amputations.  Remote colonoscopy w/o pathology in [facility].    GI office visit on 11/18/2020 with [facility] for evaluation several weeks of abdominal pain, nausea, vomiting. 11/13/2020 labs with T bili 0.5.  Alk phos 135.  AST/ALT 13/29. Recent imaging included: 10/23/2020 CTAP w contrast: Chronic calcific pancreatitis, no acute pancreatitis.  Dilated PD, pancreatic atrophy.  Fecal retention and small fat-containing umbilical hernia. For his GI symptoms he has been taking Protonix 40 po bid, sucralfate liquid qid, tramadol, acetaminophen, recent trial of tramadol and increase in tramadol dosing. The diclofenac and Carafate was not helping so he stopped using these.  He restarted Creon which she had not been taking for a couple of years but this caused constipation so he stopped it. Overall the discomfort and nausea vomiting feel different from the symptoms he normally associates with chronic pancreatitis.  Pain is predominantly in the right upper quadrant.  Occasionally radiates into the back.  He is also having back pain due to osteomyelitis. 11/20/2020 EGD: esophageal candidiasis, small food residue in the stomach, prepyloric erosions. Path: Confirmed Candida esophagitis and mild reactive gastropathy, no H. pylori. Ended up having an MRCP on  11/28/2020.  This showed changes of chronic pancreatitis.  There were 2 small filling defects in the mid to distal common bile duct as well as mild intrahepatic and extrahepatic ductal dilatation and CBD narrowing at the level of pancreatic head.  Changes of T9 and T10 vertebrae concerning for osteomyelitis/discitis.  In the meantime he has been admitted with spinal osteomyelitis/discitis w abscess, osteo of right hip and Left foot.  Ongoing RUQ pain.  No anorexia, no nausea, vomiting. Today's abdominal ultrasound shows CBD measuring 1.9 m.  Gallbladder surgically absent. PV Dopplers normal.  T bili 0.6.  Alkaline phosphatase 212.  AST/ALT 80/150. Hgb 10.6, baseline 11-14 over the last 3 months.  MCV normal.  TEE today shows no vegetation or thrombi.  LVEF is 55%.  Patient had a 7-year history of sobriety and then a couple of years ago relapsed for short time, got sober and then relapsed again.  Has not had any alcohol for 14 months.   Past Medical History:  Diagnosis Date  . Alcoholism ([place])   . Anemia   . Anxiety   . Arthritis   . Asthma   . Blood transfusion without reported diagnosis   . COPD (chronic obstructive pulmonary disease) ([place])   . Depression   . Diabetes ([place])    type 2  . GERD (gastroesophageal reflux disease)   . History of hiatal hernia   . HTN (hypertension)   .  Neuromuscular disorder (H[facility])    tremors  . Osteomyelitis ([place])    right forefoot  . Pancreatitis   . Pneumonia   . Substance abuse ([place])   . 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 Wears glasses     Past Surgical History:  Procedure Laterality Date  . 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 AMPUTATION Right 10/18/2019   Procedure: RIGHT SECOND TOE AMPUTATION;  Surgeon: Newt Minion, MD;  Location: [place];  Service: Orthopedics;  Laterality: Right;  . AMPUTATION Right 11/08/2019   Procedure: RIGHT TRANSMETATARSAL AMPUTATION;  Surgeon: Newt Minion, MD;  Location: [place];  Service:  Orthopedics;  Laterality: Right;  . AMPUTATION Left 12/02/2020   Procedure: AMPUTATION 2ND TOE;  Surgeon: Elam Dutch, MD;  Location: [place];  Service: Vascular;  Laterality: Left;  . APPLICATION OF WOUND VAC Right 01/10/2020   Procedure: APPLICATION OF WOUND VAC, right foot;  Surgeon: [name], MD;  Location: [place];  Service: Vascular;  Laterality: Right;  . CHOLECYSTECTOMY  2006   with gallbladder and spleen  . COLONOSCOPY  8-10 years ago    in [place] exam per pt  . HERNIA REPAIR  2008, 2010   hiatal hernia and 1 additional  . NASAL SEPTUM SURGERY    . PANCREATIC PSEUDOCYST DRAINAGE    . SPLENECTOMY  2006  . STUMP REVISION Right 11/24/2019   Procedure: REVISION RIGHT TRANSMETATARSAL AMPUTATION;  Surgeon: Newt Minion, MD;  Location: [place];  Service: Orthopedics;  Laterality: Right;  . TOTAL KNEE ARTHROPLASTY Right 1982, 1984  . TRANSMETATARSAL AMPUTATION Left 09/06/2020   Procedure: AMPUTATION LEFT GREAT TOE;  Surgeon: Waynetta Sandy, MD;  Location: [place];  Service: Vascular;  Laterality: Left;  . WOUND DEBRIDEMENT Right 01/10/2020   Procedure: incisional DEBRIDEMENT of RIGHT TRANSMETATARSAL WOUND;  Surgeon: [name], MD;  Location: [place];  Service: Vascular;  Laterality: Right;    Prior to Admission medications   Medication Sig Start Date End Date Taking? Authorizing Provider  acetaminophen (TYLENOL 8 HOUR) 650 MG CR tablet Take 1 tablet (650 mg total) by mouth every 8 (eight) hours as needed for pain. 09/07/20  Yes Ezequiel Essex, MD  ADVAIR DISKUS 250-50 MCG/DOSE AEPB INHALE 1 PUFF INTO THE LUNGS 2 TIMES DAILY. Patient taking differently: Inhale 1 puff into the lungs in the morning and at bedtime. 11/20/20  Yes Mullis, Kiersten P, DO  albuterol (VENTOLIN HFA) 108 (90 Base) MCG/ACT inhaler INHALE 2-4 PUFFS INTO THE LUNGS EVERY 4 (FOUR) HOURS AS NEEDED FOR WHEEZING (OR COUGH). 11/20/20  Yes Mullis, Kiersten P, DO  ARIPiprazole (ABILIFY) 5 MG  tablet TAKE 1 TABLET (5 MG TOTAL) BY MOUTH DAILY. Patient taking differently: Take 5 mg by mouth every evening. 11/05/20  Yes Mullis, Kiersten P, DO  atorvastatin (LIPITOR) 40 MG tablet Take 1 tablet (40 mg total) by mouth daily. 12/12/19  Yes Mullis, Kiersten P, DO  diclofenac (VOLTAREN) 50 MG EC tablet Take 1 tablet (50 mg total) by mouth 3 (three) times daily. 11/13/20  Yes Mullis, Kiersten P, DO  DULoxetine (CYMBALTA) 60 MG capsule TAKE 1 CAPSULE (60 MG TOTAL) BY MOUTH DAILY. 11/05/20  Yes Mullis, Kiersten P, DO  fluconazole (DIFLUCAN) 100 MG tablet Take 1 tablet (100 mg total) by mouth daily. 11/27/20  Yes Danis, Kirke Corin, MD  fluticasone-salmeterol (ADVAIR HFA) 115-21 MCG/ACT inhaler Inhale 1 puff into the lungs 2 (two) times daily.  Yes [provider]  gabapentin (NEURONTIN) 300 MG capsule Take 2 capsules (600 mg total) by mouth 3 (three) times daily. 11/13/20  Yes Mullis, Kiersten P, DO  Insulin Glargine-yfgn 100 UNIT/ML SOPN Inject 18 Units into the skin in the morning. 11/05/20  Yes Mullis, Kiersten P, DO  insulin lispro (HUMALOG KWIKPEN) 100 UNIT/ML KwikPen Inject 6-10 Units into the skin 3 (three) times daily. 11/05/20  Yes Mullis, Kiersten P, DO  JANUVIA 100 MG tablet TAKE 1 TABLET (100 MG TOTAL) BY MOUTH DAILY. 11/05/20  Yes Mullis, Kiersten P, DO  JARDIANCE 25 MG TABS tablet TAKE 1 TABLET BY MOUTH DAILY. 11/05/20  Yes Mullis, Kiersten P, DO  metFORMIN (GLUCOPHAGE) 1000 MG tablet TAKE 1 TABLET (1,000 MG TOTAL) BY MOUTH 2 (TWO) TIMES DAILY WITH A MEAL. 06/11/20  Yes Mullis, Kiersten P, DO  mirtazapine (REMERON) 30 MG tablet Take 1 tablet (30 mg total) by mouth at bedtime. 09/20/19  Yes Mullis, Kiersten P, DO  montelukast (SINGULAIR) 10 MG tablet TAKE 1 TABLET (10 MG TOTAL) BY MOUTH AT BEDTIME. Patient taking differently: Take 10 mg by mouth in the morning. 11/05/20  Yes Mullis, Kiersten P, DO  PANCRELIPASE, LIP-PROT-AMYL, PO Take 36,000 Units by mouth with breakfast, with lunch, and with  evening meal.   Yes [provider]  pantoprazole (PROTONIX) 40 MG tablet Take 1 tablet (40 mg total) by mouth 2 (two) times daily. 11/13/20  Yes Mullis, Kiersten P, DO  propranolol ER (INDERAL LA) 80 MG 24 hr capsule Take 1 capsule (80 mg total) by mouth daily. 10/23/20  Yes Mullis, Kiersten P, DO  bismuth subsalicylate (PEPTO BISMOL) 262 MG/15ML suspension Take 30 mLs by mouth every 6 (six) hours as needed for indigestion or diarrhea or loose stools.    [provider]  cetirizine (ZYRTEC) 10 MG tablet Take 1 tablet (10 mg total) by mouth daily. Patient taking differently: Take 10 mg by mouth daily as needed for allergies. 10/29/20   Zenia Resides, MD  Continuous Blood Gluc Receiver (DEXCOM G6 RECEIVER) DEVI 1 Device by Does not apply route 4 (four) times daily. 03/15/20   Mullis, Kiersten P, DO  Continuous Blood Gluc Transmit (DEXCOM G6 TRANSMITTER) MISC 4 (four) times daily. as directed 09/09/20   [provider]  cyclobenzaprine (FLEXERIL) 5 MG tablet Take 1 tablet (5 mg total) by mouth 2 (two) times daily as needed for muscle spasms. Patient not taking: No sig reported 11/18/20   Levin Erp, PA  EASY TOUCH PEN NEEDLES 31G X 5 MM MISC Inject 1 application as directed 3 (three) times daily. 10/27/19   Mullis, Kiersten P, DO  FREESTYLE LITE test strip USE AS INSTRUCTED 05/14/20   Mina Marble P, DO  glucose blood test strip USE AS INSTRUCTED 05/15/20   [provider]  glucose blood test strip USE AS INSTRUCTED 09/09/20   [provider]  Lancets (FREESTYLE) lancets Use as instructed 03/29/18   Harriet Butte, DO  sucralfate (CARAFATE) 1 GM/10ML suspension Take 10 mLs (1 g total) by mouth 4 (four) times daily. Patient not taking: No sig reported 11/07/20   Mina Marble P, DO    Scheduled Meds: . [MAR Hold] ARIPiprazole  5 mg Oral QPM  . [MAR Hold] Chlorhexidine Gluconate Cloth  6 each Topical Q0600  . [MAR Hold] DULoxetine  60 mg Oral  Daily  . [MAR Hold] enoxaparin (LOVENOX) injection  60 mg Subcutaneous Q24H  . [MAR Hold] fluconazole  200 mg Oral Daily  . [  MAR Hold] gabapentin  600 mg Oral TID  . [MAR Hold] insulin aspart  0-9 Units Subcutaneous TID WC  . [MAR Hold] lidocaine  1 patch Transdermal Q24H  . [MAR Hold] lipase/protease/amylase  36,000 Units Oral TID WC  . [MAR Hold] mirtazapine  30 mg Oral QHS  . [MAR Hold] mometasone-formoterol  2 puff Inhalation BID  . [MAR Hold] montelukast  10 mg Oral q AM  . [MAR Hold] mupirocin ointment  1 application Nasal BID  . [MAR Hold] pantoprazole  40 mg Oral Daily  . [MAR Hold] propranolol ER  80 mg Oral Daily   Infusions: . [MAR Hold] DAPTOmycin (CUBICIN)  IV 1,000 mg (12/03/20 2214)   PRN Meds: [MAR Hold] acetaminophen, [MAR Hold] albuterol, [MAR Hold] ketorolac, [MAR Hold] oxyCODONE   Allergies as of 11/29/2020 - Review Complete 11/29/2020  Allergen Reaction Noted  . Eggs or egg-derived products Rash 10/13/2019  . Morphine and related Other (See Comments) 03/29/2018  . Cocoa Rash 11/12/2013    Family History  Problem Relation Age of Onset  . Diabetes Mother   . Hypertension Mother   . Hyperlipidemia Mother   . Kidney disease Mother   . Thyroid disease Mother   . Breast cancer Mother        mets  . Lung cancer Mother   . Diabetes Father   . Alcohol abuse Sister   . Sickle cell trait Sister   . Diabetes Brother   . Asthma Brother   . Lung cancer Maternal Grandmother   . Kidney disease Maternal Grandmother   . Liver cancer Maternal Uncle        x 4-5  . Kidney disease Maternal Uncle   . Kidney disease Paternal Uncle   . Colon cancer Neg Hx   . Colon polyps Neg Hx   . Esophageal cancer Neg Hx   . Rectal cancer Neg Hx   . Stomach cancer Neg Hx     Social History   Socioeconomic History  . Marital status: Married    Spouse name: [name]  . Number of children: 1  . Years of education: Not on file  . Highest education level: Not on file   Occupational History  . Occupation: unemployed  Tobacco Use  . Smoking status: Current Every Day Smoker    Packs/day: 1.00    Years: 38.00    Pack years: 38.00    Types: Cigarettes    Start date: 09/14/1982  . Smokeless tobacco: Never Used  . Tobacco comment: tobacco info given to patient  Vaping Use  . Vaping Use: Never used  Substance and Sexual Activity  . Alcohol use: Not Currently    Comment: 2-3 per day, none since 2019  . Drug use: Not Currently  . Sexual activity: Not on file  Other Topics Concern  . Not on file  Social History Narrative   One child, 4 step-kids   Social Determinants of Health   Financial Resource Strain: Not on file  Food Insecurity: Not on file  Transportation Needs: Not on file  Physical Activity: Not on file  Stress: Not on file  Social Connections: Not on file  Intimate Partner Violence: Not on file    REVIEW OF SYSTEMS: Constitutional: No profound weakness or fatigue but some general malaise. ENT:  No nose bleeds Pulm: No shortness of breath.  No cough. CV:  No palpitations, no LE edema.  No angina. GU:  No hematuria, no frequency GI: See HPI. Heme: Denies unusual or  excessive bleeding or bruising. Transfusions: Apparently had some remotely but none in the last several years. Neuro:  No headaches, no peripheral tingling or numbness Derm:  No itching, no rash or sores.  Endocrine:  No sweats or chills.  No polyuria or dysuria Immunization: Vaccination history reviewed.  He has been faxed and boosted with the [place] COVID-19 vaccine.   PHYSICAL EXAM: Vital signs in last 24 hours: Vitals:   12/04/20 1410 12/04/20 1414  BP: (!) 163/101 (!) 178/93  Pulse: (!) 59   Resp: (!) 8   Temp:    SpO2: 97%    Wt Readings from Last 3 Encounters:  12/04/20 115.3 kg  11/29/20 112.9 kg  11/20/20 113.4 kg    General: Pleasant, comfortable, well spoken, alert Head: No facial asymmetry or swelling.  No signs of head trauma. Eyes: No scleral  icterus or conjunctival pallor. Ears: Not hard of hearing Nose: No congestion or discharge Mouth: Nonspecific white coating on the tongue, pharynx is clear.  Mucosa is moist and pink.  Tongue midline.  Good dentition. Neck: No JVD, no masses, no thyromegaly Lungs: Clear bilaterally without labored breathing or cough. Heart: RRR.  No MRG.  S1, S2 present Abdomen: Soft.  Minor tenderness bilaterally in the abdomen more prominent on the right upper quadrant but no guarding or rebound.  Active bowel sounds.  No HSM, masses, bruits, hernias.   Rectal: Deferred Musc/Skeltl: No joint redness, swelling.  Right forefoot amputation.  Left foot is completely Ace wrapped. Extremities: No edema. Neurologic: Alert.  Oriented x3.  Moves all 4 limbs without obvious deficit.  No tremors. Skin: No rash, no sores, no suspicious lesions Nodes: No cervical adenopathy. Psych: Cooperative, calm, pleasant, well spoken.  Intake/Output from previous day: 03/22 0701 - 03/23 0700 In: 740 [P.O.:620; IV Piggyback:120] Out: 800 [Urine:800] Intake/Output this shift: Total I/O In: 560 [P.O.:360; I.V.:200] Out: -   LAB RESULTS: Recent Labs    12/02/20 0302 12/03/20 0833 12/04/20 0907  WBC 9.3 8.2 7.3  HGB 10.7* 12.0* 10.6*  HCT 32.9* 36.6* 32.7*  PLT 474* 459* 546*   BMET Lab Results  Component Value Date   NA 135 12/04/2020   NA 136 12/03/2020   NA 132 (L) 12/02/2020   K 4.3 12/04/2020   K 4.2 12/03/2020   K 4.3 12/02/2020   CL 102 12/04/2020   CL 103 12/03/2020   CL 100 12/02/2020   CO2 28 12/04/2020   CO2 26 12/03/2020   CO2 25 12/02/2020   GLUCOSE 174 (H) 12/04/2020   GLUCOSE 177 (H) 12/03/2020   GLUCOSE 289 (H) 12/02/2020   BUN 11 12/04/2020   BUN 12 12/03/2020   BUN 15 12/02/2020   CREATININE 0.78 12/04/2020   CREATININE 0.83 12/03/2020   CREATININE 0.99 12/02/2020   CALCIUM 8.8 (L) 12/04/2020   CALCIUM 9.0 12/03/2020   CALCIUM 8.6 (L) 12/02/2020   LFT Recent Labs     12/04/20 0907  PROT 7.0  ALBUMIN 2.4*  AST 80*  ALT 150*  ALKPHOS 212*  BILITOT 0.6   PT/INR Lab Results  Component Value Date   INR 1.0 12/02/2020   INR 1.0 01/10/2020   INR 1.0 06/14/2019   Hepatitis Panel No results for input(s): HEPBSAG, HCVAB, HEPAIGM, HEPBIGM in the last 72 hours. C-Diff No components found for: CDIFF Lipase     Component Value Date/Time   LIPASE 6 (L) 10/23/2020 1016    Drugs of Abuse  No results found for: LABOPIA, COCAINSCRNUR, LABBENZ, AMPHETMU,  THCU, LABBARB   RADIOLOGY STUDIES: US Abdomen Limited RUQ (LIVER/GB)  Result Date: 12/04/2020 CLINICAL DATA:  Abdominal pain, history COPD, diabetes mellitus, hypertension EXAM: ULTRASOUND ABDOMEN LIMITED RIGHT UPPER QUADRANT COMPARISON:  CT abdomen pelvis 10/23/2020 FINDINGS: Gallbladder: Surgically absent Common bile duct: Diameter: 19 mm diameter, dilated, increased from the 14 mm on the prior CT. No definite common duct stones. Liver: Normal parenchymal echogenicity. No mass or nodularity. Intrahepatic biliary dilatation present. Portal vein is patent on color Doppler imaging with normal direction of blood flow towards the liver. Other: No RIGHT upper quadrant free fluid. IMPRESSION: Post cholecystectomy. Intrahepatic and extrahepatic biliary dilatation with CBD now 19 mm, increased since 10/23/2020; recommend correlation with LFTs. Electronically Signed   By: Lavonia Dana M.D.   On: 12/04/2020 11:54     IMPRESSION:   *   Right upper quadrant pain.  Resolved Nausea and vomiting.    Remote cholecystectomy. Chronic calcific pancreatitis but also filling defects in the CBD based on MRCP 5 days ago and increasing CBD diameter.  *    Candida esophagitis, reactive gastropathy on EGD 11/20/2020.  Continues on po Diflucan.  Original plan was for 21 days therapy starting 3/16, should finish on 4/6.    *    Chronic calcific pancreatitis.  Continues on AC Creon.  *    Extensive osteomyelitis involving the spine,  hip, left foot. PCR positive for MRSA.   Day 2 daptomycin.    *     Alcoholism, in remission.  *   [place] anemia.      PLAN:     *   Adjusted Diflucan orders so it will finish on April 6.  *    EUS/ERCP, ?  Timing.  Dr. Rush Landmark will see pt today and define plans.     Azucena Freed  12/04/2020, 2:21 PM Phone [phone]

## 2020-12-04 NOTE — Anesthesia Preprocedure Evaluation (Addendum)
Anesthesia Evaluation  Patient identified by MRN, date of birth, ID band Patient awake    Reviewed: Allergy & Precautions, NPO status , Patient's Chart, lab work & pertinent test results  Airway Mallampati: II  TM Distance: >3 FB Neck ROM: Full    Dental no notable dental hx.    Pulmonary asthma , COPD,  COPD inhaler, Current Smoker and Patient abstained from smoking.,  Down to 1/2ppd   Pulmonary exam normal breath sounds clear to auscultation       Cardiovascular hypertension (does not take BP meds at home- has been hypertensive this admission, 180-190 SBP in preop), Pt. on home beta blockers Normal cardiovascular exam Rhythm:Regular Rate:Normal  Echo 12/01/20: 1. Left ventricular ejection fraction, by estimation, is 60 to 65%. The  left ventricle has normal function. The left ventricle has no regional  wall motion abnormalities. Left ventricular diastolic parameters were  normal.  2. Right ventricular systolic function is normal. The right ventricular  size is normal.  3. The pericardial effusion is anterior to the right ventricle.  4. The mitral valve was not well visualized. No evidence of mitral valve  regurgitation.  5. The aortic valve was not well visualized. Aortic valve regurgitation  is not visualized. Aortic valve area, by VTI measures 4.36 cm. Aortic  valve mean gradient measures 3.0 mmHg. Aortic valve Vmax measures 1.19  m/s.  6. Aortic dilatation noted. There is borderline dilatation of the aortic  root, measuring 37 mm.  7. The inferior vena cava is normal in size with greater than 50%  respiratory variability, suggesting right atrial pressure of 3 mmHg.  8. Poor image quality precludes adequate assessment of valves. Cannot  rule out vegetation on mitral valve in the apical 2 chamber view. The AV  cannot be assessed. Suggest TEE for further evaulation.    Neuro/Psych PSYCHIATRIC DISORDERS Anxiety  Depression Propanolol for tremors    GI/Hepatic hiatal hernia, GERD  Controlled and Medicated,(+)     substance abuse (42month ago quit)  alcohol use,   Endo/Other  diabetes, Poorly Controlled, Type 2, Insulin Dependent, Oral Hypoglycemic AgentsObesity BMI 32 a1c 9.7 FS 1h ago 113  Renal/GU   negative genitourinary   Musculoskeletal  (+) Arthritis , Osteoarthritis,  Osteo right foot    Abdominal Normal abdominal exam  (+)   Peds  Hematology 10.6/32.7, plt 546   Anesthesia Other Findings bacteremia  Reproductive/Obstetrics negative OB ROS                            Anesthesia Physical Anesthesia Plan  ASA: II  Anesthesia Plan: MAC   Post-op Pain Management:    Induction:   PONV Risk Score and Plan: Propofol infusion  Airway Management Planned: Natural Airway and Mask  Additional Equipment: TEE  Intra-op Plan:   Post-operative Plan:   Informed Consent: I have reviewed the patients History and Physical, chart, labs and discussed the procedure including the risks, benefits and alternatives for the proposed anesthesia with the patient or authorized representative who has indicated his/her understanding and acceptance.     Dental advisory given  Plan Discussed with: CRNA  Anesthesia Plan Comments:        Anesthesia Quick Evaluation

## 2020-12-04 NOTE — Interval H&P Note (Signed)
History and Physical Interval Note:  12/04/2020 1:43 PM  Christopher Fernandez  has presented today for surgery, with the diagnosis of bacteremia.  The various methods of treatment have been discussed with the patient and family. After consideration of risks, benefits and other options for treatment, the patient has consented to  Procedure(s): TRANSESOPHAGEAL ECHOCARDIOGRAM (TEE) (N/A) as a surgical intervention.  The patient's history has been reviewed, patient examined, no change in status, stable for surgery.  I have reviewed the patient's chart and labs.  Questions were answered to the patient's satisfaction.     [name]

## 2020-12-04 NOTE — Anesthesia Postprocedure Evaluation (Signed)
Anesthesia Post Note  Patient: Christopher Fernandez  Procedure(s) Performed: AMPUTATION 2ND TOE (Left Foot)     Patient location during evaluation: PACU Anesthesia Type: Regional and MAC Level of consciousness: awake and alert Pain management: pain level controlled Vital Signs Assessment: post-procedure vital signs reviewed and stable Respiratory status: spontaneous breathing, nonlabored ventilation, respiratory function stable and patient connected to nasal cannula oxygen Cardiovascular status: stable and blood pressure returned to baseline Postop Assessment: no apparent nausea or vomiting Anesthetic complications: no   No complications documented.  Last Vitals:  Vitals:   12/04/20 0434 12/04/20 0822  BP: (!) 142/94 130/89  Pulse: 61 61  Resp:  15  Temp: 36.7 C 36.6 C  SpO2: 98% 97%    Last Pain:  Vitals:   12/04/20 1142  TempSrc:   PainSc: 8                  Josie Burleigh

## 2020-12-04 NOTE — Anesthesia Procedure Notes (Signed)
Procedure Name: MAC Date/Time: 12/04/2020 1:47 PM Performed by: Imagene Riches, CRNA Pre-anesthesia Checklist: Patient identified, Emergency Drugs available, Suction available, Patient being monitored and Timeout performed Patient Re-evaluated:Patient Re-evaluated prior to induction Oxygen Delivery Method: Nasal cannula

## 2020-12-04 NOTE — Progress Notes (Signed)
PT Cancellation Note  Patient Details Name: Christopher Fernandez MRN: [id number] DOB: 20-Nov-1970   Cancelled Treatment:    Reason Eval/Treat Not Completed: Patient at procedure or test/unavailable   Stacie Glaze, PT Acute Rehabilitation Services Pager [phone]  Office [phone]   Louis Matte 12/04/2020, 1:36 PM

## 2020-12-04 NOTE — CV Procedure (Signed)
    Transesophageal Echocardiogram Note  ANUEL SITTER [id number] Oct 03, 1970  Procedure: Transesophageal Echocardiogram Indications:  Bacteremia    Procedure Details Consent: Obtained Time Out: Verified patient identification, verified procedure, site/side was marked, verified correct patient position, special equipment/implants available, Radiology Safety Procedures followed,  medications/allergies/relevent history reviewed, required imaging and test results available.  Performed  Medications:  During this procedure the patient is administered  Fentanyl 100 mcg, Lidocaine 100 mg IV , followed by a propofol drip 280 mg IV by Beverlee Nims CRNA for sedation.  The patient's heart rate, blood pressure, and oxygen saturation are monitored continuously during the procedure. The period of conscious sedation is  30  minutes, of which I was present face-to-face 100% of this time.  Left Ventrical:  Normal LV function, EF 55%  Mitral Valve: normal MV,  No vegetation,  Trace MR   Aortic Valve: normal AV,  No vegetation.     Tricuspid Valve: normal , trace TR, no vegetation   Pulmonic Valve: no vegetation,  Trivial PI   Left Atrium/ Left atrial appendage: no thrombi   Atrial septum: intact.  No PFO or ASD bycolor dopplerr   Aorta:  Normal    Complications: No apparent complications Patient did tolerate procedure well.   Thayer Headings, Brooke Bonito., MD, [facility] 12/04/2020, 1:56 PM

## 2020-12-04 NOTE — Progress Notes (Addendum)
1300 NPO post midnight maint. Pt to Endo for TEE. 1500 Received pt from Endo, A&O x4. Pt c/o right upper back pain, medicated.

## 2020-12-05 ENCOUNTER — Other Ambulatory Visit: Payer: Self-pay

## 2020-12-05 DIAGNOSIS — E1165 Type 2 diabetes mellitus with hyperglycemia: Secondary | ICD-10-CM

## 2020-12-05 DIAGNOSIS — M86151 Other acute osteomyelitis, right femur: Secondary | ICD-10-CM | POA: Diagnosis not present

## 2020-12-05 DIAGNOSIS — K860 Alcohol-induced chronic pancreatitis: Secondary | ICD-10-CM

## 2020-12-05 DIAGNOSIS — E11628 Type 2 diabetes mellitus with other skin complications: Secondary | ICD-10-CM | POA: Diagnosis not present

## 2020-12-05 DIAGNOSIS — M8619 Other acute osteomyelitis, multiple sites: Secondary | ICD-10-CM | POA: Diagnosis not present

## 2020-12-05 DIAGNOSIS — K8050 Calculus of bile duct without cholangitis or cholecystitis without obstruction: Secondary | ICD-10-CM

## 2020-12-05 DIAGNOSIS — K861 Other chronic pancreatitis: Secondary | ICD-10-CM

## 2020-12-05 DIAGNOSIS — R7881 Bacteremia: Secondary | ICD-10-CM | POA: Diagnosis not present

## 2020-12-05 DIAGNOSIS — M4645 Discitis, unspecified, thoracolumbar region: Secondary | ICD-10-CM | POA: Diagnosis not present

## 2020-12-05 DIAGNOSIS — R109 Unspecified abdominal pain: Secondary | ICD-10-CM | POA: Diagnosis not present

## 2020-12-05 DIAGNOSIS — K838 Other specified diseases of biliary tract: Secondary | ICD-10-CM

## 2020-12-05 LAB — COMPREHENSIVE METABOLIC PANEL
ALT: 86 U/L — ABNORMAL HIGH (ref 0–44)
AST: 31 U/L (ref 15–41)
Albumin: 2.4 g/dL — ABNORMAL LOW (ref 3.5–5.0)
Alkaline Phosphatase: 166 U/L — ABNORMAL HIGH (ref 38–126)
Anion gap: 8 (ref 5–15)
BUN: 11 mg/dL (ref 6–20)
CO2: 25 mmol/L (ref 22–32)
Calcium: 8.4 mg/dL — ABNORMAL LOW (ref 8.9–10.3)
Chloride: 98 mmol/L (ref 98–111)
Creatinine, Ser: 0.79 mg/dL (ref 0.61–1.24)
GFR, Estimated: >60 mL/min (ref >60)
Glucose, Bld: 295 mg/dL — ABNORMAL HIGH (ref 70–99)
Potassium: 4.1 mmol/L (ref 3.5–5.1)
Sodium: 131 mmol/L — ABNORMAL LOW (ref 135–145)
Total Bilirubin: 0.3 mg/dL (ref 0.3–1.2)
Total Protein: 6.3 g/dL — ABNORMAL LOW (ref 6.5–8.1)

## 2020-12-05 LAB — CBC
HCT: 31.3 % — ABNORMAL LOW (ref 39.0–52.0)
Hemoglobin: 10.4 g/dL — ABNORMAL LOW (ref 13.0–17.0)
MCH: 28 pg (ref 26.0–34.0)
MCHC: 33.2 g/dL (ref 30.0–36.0)
MCV: 84.4 fL (ref 80.0–100.0)
Platelets: 509 10*3/uL — ABNORMAL HIGH (ref 150–400)
RBC: 3.71 MIL/uL — ABNORMAL LOW (ref 4.22–5.81)
RDW: 13.5 % (ref 11.5–15.5)
WBC: 7.3 10*3/uL (ref 4.0–10.5)
nRBC: 0 % (ref 0.0–0.2)

## 2020-12-05 LAB — CULTURE, BLOOD (ROUTINE X 2): Special Requests: ADEQUATE

## 2020-12-05 LAB — GLUCOSE, CAPILLARY
Glucose-Capillary: 231 mg/dL — ABNORMAL HIGH (ref 70–99)
Glucose-Capillary: 299 mg/dL — ABNORMAL HIGH (ref 70–99)
Glucose-Capillary: 204 mg/dL — ABNORMAL HIGH (ref 70–99)
Glucose-Capillary: 177 mg/dL — ABNORMAL HIGH (ref 70–99)

## 2020-12-05 MED ORDER — TRAMADOL HCL 50 MG PO TABS
25.0000 mg | ORAL_TABLET | Freq: Four times a day (QID) | ORAL | Status: DC | PRN
  Administered 2020-12-05 – 2020-12-06 (×3): 25 mg via ORAL
  Filled 2020-12-05 (×5): qty 1

## 2020-12-05 MED ORDER — IBUPROFEN 200 MG PO TABS
400.0000 mg | ORAL_TABLET | Freq: Once | ORAL | Status: AC
  Administered 2020-12-05: 400 mg via ORAL
  Filled 2020-12-05: qty 2

## 2020-12-05 MED ORDER — INSULIN GLARGINE 100 UNIT/ML ~~LOC~~ SOLN
5.0000 [IU] | Freq: Once | SUBCUTANEOUS | Status: AC
  Administered 2020-12-05: 5 [IU] via SUBCUTANEOUS
  Filled 2020-12-05: qty 0.05

## 2020-12-05 MED ORDER — ACETAMINOPHEN 325 MG PO TABS
650.0000 mg | ORAL_TABLET | Freq: Four times a day (QID) | ORAL | Status: DC | PRN
  Administered 2020-12-05 – 2020-12-06 (×2): 650 mg via ORAL
  Filled 2020-12-05 (×2): qty 2

## 2020-12-05 NOTE — Progress Notes (Signed)
Occupational Therapy Treatment Patient Details Name: Christopher Fernandez MRN: [id number] DOB: 05/21/1971 Today's Date: 12/05/2020    History of present illness Patient is a 50 y/o male who presented directly from [facility] clinic with back pain on 3/18 with concern for osteomyelitis T9-T10. MRI + for T9-T10 osteomyelitis. 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 PMH: chronic pancreatitis, R transmet amputation, alcohol use dependence, anxiety, GERD, diabetic neuropathy, BPH, HTN, DM   OT comments  Goals met this session. Pt functioning at modified independent level with completion of ADL and functional mobility at spc level. Patient with no further acute OT needs identified. All education has been completed and the patient has no further questions. See below for any follow-up Occupational Therapy or equipment needs. OT to sign off. Thank you for referral.    Follow Up Recommendations  No OT follow up    Equipment Recommendations  Other (comment) (reacher, LH bath sponge)    Recommendations for Other Services      Precautions / Restrictions Precautions Precautions: Fall Precaution Comments: log roll in/out of bed Required Braces or Orthoses: Other Brace Other Brace: darco shoe, WBAT with shoe donned per Dr. Nona Dell office (per PT, called 9:20 am on 3/22 to verify) Restrictions Weight Bearing Restrictions: No       Mobility Bed Mobility Overal bed mobility: Modified Independent             General bed mobility comments: up in chair upon arrival    Transfers Overall transfer level: Modified independent Equipment used: Quad cane             General transfer comment: pt ambulating around room without LOB    Balance Overall balance assessment: Mild deficits observed, not formally tested                                         ADL either performed or assessed with clinical judgement   ADL Overall ADL's : Needs  assistance/impaired Eating/Feeding: Independent   Grooming: Wash/dry hands;Wash/dry face;Standing;Modified independent   Upper Body Bathing: Modified independent;Standing Upper Body Bathing Details (indicate cue type and reason): completed at sink level Lower Body Bathing: Modified independent;Sit to/from stand       Lower Body Dressing: Modified independent;Sit to/from stand Lower Body Dressing Details (indicate cue type and reason): able to don [place] while seated Toilet Transfer: Modified Independent;Ambulation;Regular Glass blower/designer Details (indicate cue type and reason): used cane Toileting- Clothing Manipulation and Hygiene: Modified independent;Sit to/from stand       Functional mobility during ADLs: Modified independent General ADL Comments: pt required reminder to don [place] prior to mobility, educated pt on importance of using DARCO shoe during mobility     Vision       Perception     Praxis      Cognition Arousal/Alertness: Awake/alert Behavior During Therapy: WFL for tasks assessed/performed Overall Cognitive Status: Within Functional Limits for tasks assessed                                          Exercises     Shoulder Instructions       General Comments vss    Pertinent Vitals/ Pain       Pain Assessment: Faces Faces Pain Scale: Hurts a little bit  Pain Location: L foot Pain Descriptors / Indicators: Sore;Operative site guarding;Discomfort Pain Intervention(s): Monitored during session  Home Living                                          Prior Functioning/Environment              Frequency  Min 2X/week        Progress Toward Goals  OT Goals(current goals can now be found in the care plan section)  Progress towards OT goals: Goals met/education completed, patient discharged from OT  Acute Rehab OT Goals Patient Stated Goal: less pain, go home OT Goal Formulation: With  patient Potential to Achieve Goals: Good ADL Goals Pt Will Perform Grooming: with modified independence;standing Pt Will Perform Lower Body Bathing: with modified independence;sit to/from stand Pt Will Perform Lower Body Dressing: with modified independence;sit to/from stand Pt Will Transfer to Toilet: with modified independence;ambulating;regular height toilet  Plan Discharge plan remains appropriate;All goals met and education completed, patient discharged from OT services    Co-evaluation                 AM-PAC OT "6 Clicks" Daily Activity     Outcome Measure   Help from another person eating meals?: None Help from another person taking care of personal grooming?: None Help from another person toileting, which includes using toliet, bedpan, or urinal?: None Help from another person bathing (including washing, rinsing, drying)?: None Help from another person to put on and taking off regular upper body clothing?: None Help from another person to put on and taking off regular lower body clothing?: None 6 Click Score: 24    End of Session Equipment Utilized During Treatment: Other (comment) (quad cane)  OT Visit Diagnosis: Unsteadiness on feet (R26.81);Pain Pain - Right/Left: Left Pain - part of body: Ankle and joints of foot   Activity Tolerance Patient tolerated treatment well   Patient Left in chair;with call bell/phone within reach   Nurse Communication Mobility status        Time: 6168-3729 OT Time Calculation (min): 26 min  Charges: OT General Charges $OT Visit: 1 Visit OT Treatments $Self Care/Home Management : 23-37 mins  Helene Kelp OTR/L Acute Rehabilitation Services Office: [place] 12/05/2020, 10:51 AM

## 2020-12-05 NOTE — Plan of Care (Signed)
  Problem: Pain Managment: Goal: General experience of comfort will improve 12/05/2020 0805 by Mayme Genta, RN Outcome: Progressing 12/05/2020 0749 by Mayme Genta, RN Outcome: Progressing

## 2020-12-05 NOTE — Consult Note (Signed)
   [facility] CM Inpatient Consult   12/05/2020  Christopher Fernandez [birth year] [id number]   [facility] [ACO] Patient: Christopher Fernandez  Patient is currently assigned to a [place] Coordinator for the [facility] Fernandez to provide follow up for post hospital support for resource and disease management.   Patient will receive a post hospital call and will be evaluated for assessments and disease process education.      Fernandez: Patient will be followed by [place] Coordinator. Will continue to follow for progress and for post hospital needs.   For additional questions or referrals please contact:   Natividad Brood, RN BSN [facility] Liaison  Toll free office [phone]  Fax number: [phone] [email or web address]

## 2020-12-05 NOTE — Progress Notes (Signed)
PHARMACY CONSULT NOTE FOR:  OUTPATIENT  PARENTERAL ANTIBIOTIC THERAPY (OPAT)  Indication: osteomyelitis Regimen: daptomycin 1000mg  IV q24h End date: 01/29/2021  IV antibiotic discharge orders are pended. To discharging provider:  please sign these orders via discharge navigator,  Select New Orders & click on the button choice - Manage This Unsigned Work.     Thank you for allowing pharmacy to be a part of this patient's care.  Phillis Haggis 12/05/2020, 11:00 AM

## 2020-12-05 NOTE — Progress Notes (Signed)
Physical Therapy Treatment Patient Details Name: Christopher Fernandez MRN: [id number] DOB: [birth year] Today's Date: 12/05/2020    History of Present Illness Patient is a 50 y/o male who presented directly from [facility] clinic with back pain on 3/18 with concern for osteomyelitis T9-T10. MRI + for T9-T10 osteomyelitis. 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 PMH: chronic pancreatitis, R transmet amputation, alcohol use dependence, anxiety, GERD, diabetic neuropathy, BPH, HTN, DM    PT Comments    Pt seated in recliner.  He is mobilizing well this session.  Focused on progression of gt training and stair training.  Pt required minor cues to keep weight on his heel and avoid tipping the boot forward.  Continue to recommend no PT follow up.     Follow Up Recommendations  No PT follow up     Equipment Recommendations  None recommended by PT    Recommendations for Other Services       Precautions / Restrictions Precautions Precautions: Fall Precaution Comments: log roll in/out of bed Required Braces or Orthoses: Other Brace Other Brace: darco shoe, WBAT with shoe donned per Dr. Nona Dell office (per PT, called 9:20 am on 3/22 to verify) Restrictions Weight Bearing Restrictions: Yes LLE Weight Bearing: Weight bearing as tolerated (in darco shoe on heel only)    Mobility  Bed Mobility Overal bed mobility:  (seated in recliner.)             General bed mobility comments: up in chair upon arrival    Transfers Overall transfer level: Modified independent Equipment used: Quad cane Transfers: Sit to/from [employer] transfer comment: Pt required assistance to donn CAM boot.  No assistance to achieve standing.  Utilized R shoe to even out height difference from [place].  Ambulation/Gait Ambulation/Gait assistance: Supervision Gait Distance (Feet): 200 Feet Assistive device: Quad cane Gait Pattern/deviations: Wide base of support;Step-to pattern;Decreased  step length - right;Decreased step length - left;Decreased weight shift to right     General Gait Details: Supervision for safety with poor awareness of weight bearing.  After cueing he maintained weight perfectly in shooe keeping weight of the ball of his foot.   Stairs Stairs: Yes Stairs assistance: Min guard Stair Management: With cane;Step to pattern;Forwards Number of Stairs: 2 General stair comments: Cues for sequencing and safety.  Tolerated well.   Wheelchair Mobility    Modified Rankin (Stroke Patients Only)       Balance Overall balance assessment: Mild deficits observed, not formally tested                                          Cognition Arousal/Alertness: Awake/alert Behavior During Therapy: WFL for tasks assessed/performed Overall Cognitive Status: Within Functional Limits for tasks assessed                                        Exercises General Exercises - Lower Extremity Ankle Circles/Pumps: AROM    General Comments        Pertinent Vitals/Pain Pain Assessment: Faces Faces Pain Scale: Hurts little more Pain Location: L foot Pain Descriptors / Indicators: Sore;Operative site guarding;Discomfort Pain Intervention(s): Monitored during session;Repositioned    Home Living  Prior Function            PT Goals (current goals can now be found in the care plan section) Acute Rehab PT Goals Patient Stated Goal: less pain, go home Potential to Achieve Goals: Good Progress towards PT goals: Progressing toward goals    Frequency    Min 5X/week      PT Plan Current plan remains appropriate    Co-evaluation              AM-PAC PT "6 Clicks" Mobility   Outcome Measure  Help needed turning from your back to your side while in a flat bed without using bedrails?: None Help needed moving from lying on your back to sitting on the side of a flat bed without using bedrails?:  None Help needed moving to and from a bed to a chair (including a wheelchair)?: A Little Help needed standing up from a chair using your arms (e.g., wheelchair or bedside chair)?: A Little Help needed to walk in hospital room?: A Little Help needed climbing 3-5 steps with a railing? : A Little 6 Click Score: 20    End of Session Equipment Utilized During Treatment: Gait belt Activity Tolerance: Patient tolerated treatment well Patient left: in chair;with call bell/phone within reach Nurse Communication: Mobility status PT Visit Diagnosis: Unsteadiness on feet (R26.81);Muscle weakness (generalized) (M62.81)     Time: 6734-1937 PT Time Calculation (min) (ACUTE ONLY): 12 min  Charges:  $Gait Training: 8-22 mins                     Erasmo Leventhal , PTA Acute Rehabilitation Services Pager [phone] Office [phone]     Bobak Oguinn Eli Hose 12/05/2020, 4:16 PM

## 2020-12-05 NOTE — Progress Notes (Signed)
Family Medicine Teaching Service Daily Progress Note Intern Pager: [phone]  Patient name: Christopher Fernandez Medical record number: [id number] Date of birth: 09-08-1971 Age: 50 y.o. Gender: male  Primary Care Provider: Danna Hefty, DO Consultants: GI, Cardiology, ortho Code Status: Full  Pt Overview and Major Events to Date:  3/18: admitted for c/f thoracic discitis vs osteomyelitis 3/19: MR confirmed T9-T10 osteomyelitis, also left foot 2nd digit osteomyelitis with abscess  Assessment and Plan: [name] a 50 y.o.malepresenting with back pain. PMH is significant forcellulitis, diabetes, neuropathy, anemia, hyperlipidemia, asthma, alcohol abuse, tobacco use disorder, trauma, cholecystectomy, asplenia, anxietyanddepression.  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 S/p cefepime and flagyl x2 days. Patient was transitioned from vanc to daptomycin on 3/22. TEE showed no concern for vegetations with LVEF 55%.  -ID following, appreciate recommendations - Vascular surgery following, appreciate recs - Continue daptomycin - Repeat blood cultures NGTD <24h, continue to monitor - Pain control: oxycodone50m IR POQ4PRN, tylenol 6516m6PRN, gabapentin 600 mg TID, lidocaine patchQ24H - Protonix 4071maily for GI protection - PT/OT - Wound care consulted  RUQ pain RUQ [place] significant for intrahepatic and extrahepatic biliary dilatation with CBD 49m57mFTs elevated but improving (ALK 212>166, AST 80>31, ALT 150>86). Patient s/p 5 days Toradol and was discontinued, considering adding Tramadol for pain and see how he tolerates the medication. - ERCP 3/25 - Continue to monitor pain - Tylenol dose was decreased to 650mg80m PRN - Consider tramadol 25mg 83mPRN  T2DM with neuropathy: Chronic, stable CBGs108-295. Patient received 7U Novolog  - Lantus 5U daily, monitor CBGs closely and adjust dose as needed - CBG  before meals and nightly -sSSI -Continue home gabapentin  Esophageal candidiasis Found by EGD on 3/09. - Cont.fluconazole 200mg d44m until 4/6 - Hold atorvastatin  - Monitor sx  Thrombocytosis Platelets elevated to 509, likely reactive in the setting of sepsis with MRSA bacteremia and several sites concerning for osteomyelitis. Patient has had elevated platelets during this admission, previous records show platelets from 250-350.  - Continue to monitor with CBC - Continue Lovenox for DVT PPx  Pancreatic insufficiencychronic pancreatitis - Continue Creon 36, 000 units with meals . Hx of Anemia: stable Hgb 10.4. Transfusion threshold of 7. 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 - Continue propranolol 80 mg daily  Hx Asplenia, Cholecystectomy: chronic, stable -Consider Meningococal vaccine as outpatient  Anxiety with Depression: Chronic, stable Home meds:aripiprazole 5 mg daily, Cymbalta 60 mg, mirtazapine 30 mg. - Continue home medications   Weight loss Recent 12 lb weight loss and night sweats. -Continue to monitor   FEN/GI:NPO for TEE, will resume carb modified afterwards, protonix PPx: Lovenox   Status is: Inpatient  Remains inpatient appropriate because:IV treatments appropriate due to intensity of illness or inability to take PO   Dispo: The patient is from: Home              Anticipated d/c is to: Home              Patient currently is not medically stable to d/c.   Difficult to place patient No    Subjective:  Patient reports that his right groin area is feeling a  bit more painful and that he feels he is more weak in the leg, though he  is still able to ambulate well. He reports that his abdominal pain is consistent and worse right now (6/10 with max of 9/10 pain during the night) as he is not able to have the toradol. No change in the type of pain though and no new symptoms.     Objective: Temp:  [98.2 F (36.8 C)-98.7 F (37.1 C)] 98.6 F (37 C) (03/24 0812) Pulse Rate:  [59-89] 89 (03/24 0829) Resp:  [8-18] 18 (03/24 0829) BP: (115-185)/(71-101) 118/71 (03/24 0812) SpO2:  [90 %-100 %] 94 % (03/24 0829) Weight:  [115.3 kg] 115.3 kg (03/23 1315) Physical Exam: General: NAD, sitting in chair next to bed Cardiovascular: RRR, no m/r/g appreciated Respiratory: CTAB, no increased WOB, comfortable on room air Abdomen: soft, tender to deep palpation of RUQ, non-distended Extremities: moving all limbs, able to ambulate from chair to bed without assistance, dressing on left foot C/D/I   Laboratory: Recent Labs  Lab 12/03/20 0833 12/04/20 0907 12/05/20 0500  WBC 8.2 7.3 7.3  HGB 12.0* 10.6* 10.4*  HCT 36.6* 32.7* 31.3*  PLT 459* 546* 509*   Recent Labs  Lab 11/29/20 1814 11/30/20 0600 12/03/20 0833 12/04/20 0907 12/05/20 0500  NA 131*   < > 136 135 131*  K 3.8   < > 4.2 4.3 4.1  CL 101   < > 103 102 98  CO2 21*   < > _0 BUN 13   < > _1 CREATININE 1.13   < > 0.83 0.78 0.79  CALCIUM 8.4*   < > 9.0 8.8* 8.4*  PROT 6.5  --   --  7.0 6.3*  BILITOT 0.7  --   --  0.6 0.3  ALKPHOS 90  --   --  212* 166*  ALT 15  --   --  150* 86*  AST 18  --   --  80* 31  GLUCOSE 114*   < > 177* 174* 295*   < > = values in this interval not displayed.     Imaging/Diagnostic Tests: ECHO TEE  Result Date: 12/04/2020    TRANSESOPHOGEAL ECHO REPORT   Patient Name:   Christopher Fernandez Date of Exam: 12/04/2020 Medical Rec #:  [id number]      Height:       75.0 in Accession #:    [id number]     Weight:       254.2 lb Date of Birth:  07/29/1971       BSA:          2.430 m Patient Age:    6 years       BP:           122/71  mmHg Patient Gender: M              HR:           78 bpm. Exam Location:  Inpatient Procedure: Transesophageal Echo, Cardiac Doppler and Color Doppler Indications:     Bacteremia  History:         Patient has prior history of Echocardiogram examinations, most                  recent 12/01/2020. Risk Factors:Diabetes. MRSA.  Sonographer:     Dustin Flock Referring Phys:  [phone] Karoline Caldwell Diagnosing Phys: [name] MD PROCEDURE: The transesophogeal probe was passed without difficulty through  the esophogus of the patient. Sedation performed by performing physician. The patient was monitored while under deep sedation. Anesthestetic sedation was provided intravenously by  Anesthesiology: 244.67m of Propofol, 1070mof Lidocaine. The patient developed no complications during the procedure. IMPRESSIONS  1. Left ventricular ejection fraction, by estimation, is 55 to 60%. The left ventricle has normal function. The left ventricle has no regional wall motion abnormalities.  2. Right ventricular systolic function is normal. The right ventricular size is normal.  3. No left atrial/left atrial appendage thrombus was detected.  4. The mitral valve is normal in structure. Trivial mitral valve regurgitation.  5. The aortic valve is normal in structure. Aortic valve regurgitation is not visualized. FINDINGS  Left Ventricle: Left ventricular ejection fraction, by estimation, is 55 to 60%. The left ventricle has normal function. The left ventricle has no regional wall motion abnormalities. The left ventricular internal cavity size was normal in size. Right Ventricle: The right ventricular size is normal. No increase in right ventricular wall thickness. Right ventricular systolic function is normal. Left Atrium: Left atrial size was normal in size. No left atrial/left atrial appendage thrombus was detected. Right Atrium: Right atrial size was normal in size. Pericardium: There is no evidence of pericardial effusion. Mitral  Valve: The mitral valve is normal in structure. Trivial mitral valve regurgitation. There is no evidence of mitral valve vegetation. Tricuspid Valve: The tricuspid valve is grossly normal. Tricuspid valve regurgitation is mild. Aortic Valve: The aortic valve is normal in structure. Aortic valve regurgitation is not visualized. There is no evidence of aortic valve vegetation. Pulmonic Valve: The pulmonic valve was normal in structure. Pulmonic valve regurgitation is trivial. Aorta: The aortic root and ascending aorta are structurally normal, with no evidence of dilitation. IAS/Shunts: No atrial level shunt detected by color flow Doppler. PhMertie MooresD Electronically signed by PhMertie MooresD Signature Date/Time: 12/04/2020/3:22:28 PM    Final    USKoreabdomen Limited RUQ (LIVER/GB)  Result Date: 12/04/2020 CLINICAL DATA:  Abdominal pain, history COPD, diabetes mellitus, hypertension EXAM: ULTRASOUND ABDOMEN LIMITED RIGHT UPPER QUADRANT COMPARISON:  CT abdomen pelvis 10/23/2020 FINDINGS: Gallbladder: Surgically absent Common bile duct: Diameter: 19 mm diameter, dilated, increased from the 14 mm on the prior CT. No definite common duct stones. Liver: Normal parenchymal echogenicity. No mass or nodularity. Intrahepatic biliary dilatation present. Portal vein is patent on color Doppler imaging with normal direction of blood flow towards the liver. Other: No RIGHT upper quadrant free fluid. IMPRESSION: Post cholecystectomy. Intrahepatic and extrahepatic biliary dilatation with CBD now 19 mm, increased since 10/23/2020; recommend correlation with LFTs. Electronically Signed   By: MaLavonia Dana.D.   On: 12/04/2020 11:54     LiRise PatienceDO 12/05/2020, 9:24 AM PGY-1, [facility] pager: [phone] pages welcome

## 2020-12-05 NOTE — TOC Initial Note (Addendum)
Transition of Care [facility]) - Initial/Assessment Note    Patient Details  Name: Christopher Fernandez MRN: [id number] Date of Birth: September 23, 1970  Transition of Care [facility]) CM/SW Contact:    Sharin Mons, RN Phone Number: 12/05/2020, 2:02 PM  Clinical Narrative:             Readmitted with back pain, per MRI T9-T10 discitis/ osteomyelitis and L foot 2nd digit osteomyelitis with abscess.    From home with wife. Wife is a Therapist, sports. Pt states PTA independent with ADL's, no DME usage.  Pt will need LT IV ABX therapy @ d/c. Pt states has received like therapy in the past. States [facility] and [name] provided the services and would like to use them again.  Referral made with [facility]sid[facility] Evert /Pam for IV ABX  Therapy and accepted.  Referral made with [facility] for [facility], acceptance pending...  TOC team monitoring and will assist with TOC needs.  12/05/2020 @ 1505 NCM received text from [facility] / Tommi Rumps stating [name] will not be able to provide [facility] services 2/2 staffing. Per Pam with [facility] or [facility] can be and option if nurse only is needed for IV abx therapy.  TOC team will continue to monitor and assist with TOC  needs.  Expected Discharge Plan: [place] Barriers to Discharge: Continued Medical Work up   Patient Goals and CMS Choice     Choice offered to / list presented to : Patient  Expected Discharge Plan and Services Expected Discharge Plan: [place]   Discharge Planning Services: CM Consult   Living arrangements for the past 2 months: Single Family Home                 DME Arranged: Other see comment (IV ABX THERAPY, [facility])   Date DME Agency Contacted: 12/05/20 Time DME Agency Contacted: [id number] Representative spoke with at DME Agency: [place]: RN [place] Agency: [place] Date [place]: 12/05/20 Time [place]: [id number]    Prior Living  Arrangements/Services Living arrangements for the past 2 months: [place] with:: Spouse Patient language and need for interpreter reviewed:: Yes        Need for Family Participation in Patient Care: Yes (Comment) Care giver support system in place?: Yes (comment)   Criminal Activity/Legal Involvement Pertinent to Current Situation/Hospitalization: No - Comment as needed  Activities of Daily Living Home Assistive Devices/Equipment: Cane (specify quad or straight) ADL Screening (condition at time of admission) Patient's cognitive ability adequate to safely complete daily activities?: Yes Is the patient deaf or have difficulty hearing?: No Does the patient have difficulty seeing, even when wearing glasses/contacts?: No Does the patient have difficulty concentrating, remembering, or making decisions?: No Patient able to express need for assistance with ADLs?: Yes Does the patient have difficulty dressing or bathing?: No Independently performs ADLs?: Yes (appropriate for developmental age) Does the patient have difficulty walking or climbing stairs?: Yes Weakness of Legs: None Weakness of Arms/Hands: None  Permission Sought/Granted                  Emotional Assessment Appearance:: Appears stated age Attitude/Demeanor/Rapport: Gracious Affect (typically observed): Accepting Orientation: : Oriented to Self,Oriented to Place,Oriented to  Time,Oriented to Situation Alcohol / Substance Use: Not Applicable Psych Involvement: No (comment)  Admission diagnosis:  Osteomyelitis [facility]) [M86.9] Patient Active Problem List   Diagnosis Date Noted  .  Bacteremia   . Abdominal pain   . Infective myositis of right lower extremity   . Acute osteomyelitis of right pelvic region and thigh ([place])   . Discitis of thoracolumbar region   . Pericardial effusion   . Thrush   . MRSA bacteremia   . Right hip pain   . Osteomyelitis ([place]) 11/29/2020  . Chronic pancreatitis ([place])  10/27/2020  . Epigastric pain 10/23/2020  . Abnormal ankle brachial index (ABI)   . Diabetic foot infection ([place])   . Pyogenic inflammation of bone ([place])   . 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 Hyperlipidemia associated with type 2 diabetes mellitus ([place]) 03/15/2020  . MRSA infection 01/11/2020  . 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[place]) 01/10/2020  . S/P transmetatarsal amputation of foot, right ([place]) 01/09/2020  . Asplenia 12/13/2019  . Exocrine pancreatic insufficiency 06/14/2018  . Uncontrolled diabetes mellitus with hyperglycemia, with long-term current use of insulin ([place]) 02/24/2018  . Alcohol use disorder, severe, dependence ([place]) 02/24/2018  . Anxiety associated with depression 02/24/2018  . GERD (gastroesophageal reflux disease) 02/24/2018  . Anemia 02/24/2018  . Asthma, intermittent 02/24/2018  . Tobacco use disorder 02/24/2018  . Benign prostatic hyperplasia 12/30/2015  . Chronic obstructive pulmonary disease ([place]) 12/30/2015  . Diabetic neuropathy ([place]) 12/30/2015  . Restless legs syndrome 12/30/2015  . Long term current use of insulin ([place]) 08/05/2015  . ED (erectile dysfunction) of organic origin 05/23/2015  . Benign essential hypertension 02/16/2014  . Extrinsic asthma without status asthmaticus 02/16/2014   PCP:  Danna Hefty, DO Pharmacy:   [place], [place] - [address] [facility]. [address] [place] [address] Phone: [phone] Fax: [place], [place] [address] [place] [address] Phone: [phone] Fax: [phone]     Social Determinants of Health (SDOH) Interventions    Readmission Risk Interventions No flowsheet data found.

## 2020-12-05 NOTE — Progress Notes (Addendum)
Daily Rounding Note  12/05/2020, 10:15 AM  LOS: 6 days   SUBJECTIVE:   Chief complaint: Abdominal pain.  Choledocholithiasis.  Pain across the mid abdomen bilaterally, worse on the right.  Pain in his mid back and some in his right hip. No nausea or vomiting.  This has not been an issue for a few weeks.  Tolerating solid food.  Brown stool yesterday.  OBJECTIVE:         Vital signs in last 24 hours:    Temp:  [98.2 F (36.8 C)-98.7 F (37.1 C)] 98.6 F (37 C) (03/24 0812) Pulse Rate:  [59-89] 89 (03/24 0829) Resp:  [8-18] 18 (03/24 0829) BP: (115-185)/(71-101) 118/71 (03/24 0812) SpO2:  [90 %-100 %] 94 % (03/24 0829) Weight:  [115.3 kg] 115.3 kg (03/23 1315) Last BM Date: 12/03/20 Filed Weights   12/02/20 1744 12/04/20 1315  Weight: 115.3 kg 115.3 kg   General: Pleasant, comfortable, nonill appearing Heart: RRR. Chest: Clear bilaterally.  No dyspnea or cough Abdomen: Small incisional/ventral hernia just superior to the navel at the lower aspect of the long midline surgical scar.  Slight right upper quadrant tenderness without guarding or rebound.  Minor left abdominal tenderness. Extremities: No CCE.  Neuro/Psych: Walking in the room unassisted.  Pleasant, calm, fluid speech.  No gross deficits.  No tremors.   Lab Results: Recent Labs    12/03/20 0833 12/04/20 0907 12/05/20 0500  WBC 8.2 7.3 7.3  HGB 12.0* 10.6* 10.4*  HCT 36.6* 32.7* 31.3*  PLT 459* 546* 509*   BMET Recent Labs    12/03/20 0833 12/04/20 0907 12/05/20 0500  NA 136 135 131*  K 4.2 4.3 4.1  CL 103 102 98  CO2 26 28 25   GLUCOSE 177* 174* 295*  BUN 12 11 11   CREATININE 0.83 0.78 0.79  CALCIUM 9.0 8.8* 8.4*   LFT Recent Labs    12/04/20 0907 12/05/20 0500  PROT 7.0 6.3*  ALBUMIN 2.4* 2.4*  AST 80* 31  ALT 150* 86*  ALKPHOS 212* 166*  BILITOT 0.6 0.3    Studies/Results: ECHO TEE  Result Date: 12/04/2020     TRANSESOPHOGEAL ECHO REPORT   Patient Name:   Christopher Fernandez Date of Exam: 12/04/2020 Medical Rec #:  [id number]      Height:       75.0 in Accession #:    [id number]     Weight:       254.2 lb Date of Birth:  03-23-1971       BSA:          2.430 m Patient Age:    50 years       BP:           122/71 mmHg Patient Gender: M              HR:           78 bpm. Exam Location:  Inpatient Procedure: Transesophageal Echo, Cardiac Doppler and Color Doppler Indications:     Bacteremia  History:         Patient has prior history of Echocardiogram examinations, most                  recent 12/01/2020. Risk Factors:Diabetes. MRSA.  Sonographer:     Dustin Flock Referring Phys:  [phone] Karoline Caldwell Diagnosing Phys: [name] MD PROCEDURE: The transesophogeal probe was passed without difficulty through the esophogus of the patient. Sedation performed  by performing physician. The patient was monitored while under deep sedation. Anesthestetic sedation was provided intravenously by  Anesthesiology: 244.84mg  of Propofol, 100mg  of Lidocaine. The patient developed no complications during the procedure. IMPRESSIONS  1. Left ventricular ejection fraction, by estimation, is 55 to 60%. The left ventricle has normal function. The left ventricle has no regional wall motion abnormalities.  2. Right ventricular systolic function is normal. The right ventricular size is normal.  3. No left atrial/left atrial appendage thrombus was detected.  4. The mitral valve is normal in structure. Trivial mitral valve regurgitation.  5. The aortic valve is normal in structure. Aortic valve regurgitation is not visualized. FINDINGS  Left Ventricle: Left ventricular ejection fraction, by estimation, is 55 to 60%. The left ventricle has normal function. The left ventricle has no regional wall motion abnormalities. The left ventricular internal cavity size was normal in size. Right Ventricle: The right ventricular size is normal. No increase in right  ventricular wall thickness. Right ventricular systolic function is normal. Left Atrium: Left atrial size was normal in size. No left atrial/left atrial appendage thrombus was detected. Right Atrium: Right atrial size was normal in size. Pericardium: There is no evidence of pericardial effusion. Mitral Valve: The mitral valve is normal in structure. Trivial mitral valve regurgitation. There is no evidence of mitral valve vegetation. Tricuspid Valve: The tricuspid valve is grossly normal. Tricuspid valve regurgitation is mild. Aortic Valve: The aortic valve is normal in structure. Aortic valve regurgitation is not visualized. There is no evidence of aortic valve vegetation. Pulmonic Valve: The pulmonic valve was normal in structure. Pulmonic valve regurgitation is trivial. Aorta: The aortic root and ascending aorta are structurally normal, with no evidence of dilitation. IAS/Shunts: No atrial level shunt detected by color flow Doppler. [name] MD Electronically signed by [name] MD Signature Date/Time: 12/04/2020/3:22:28 PM    Final    US Abdomen Limited RUQ (LIVER/GB)  Result Date: 12/04/2020 CLINICAL DATA:  Abdominal pain, history COPD, diabetes mellitus, hypertension EXAM: ULTRASOUND ABDOMEN LIMITED RIGHT UPPER QUADRANT COMPARISON:  CT abdomen pelvis 10/23/2020 FINDINGS: Gallbladder: Surgically absent Common bile duct: Diameter: 19 mm diameter, dilated, increased from the 14 mm on the prior CT. No definite common duct stones. Liver: Normal parenchymal echogenicity. No mass or nodularity. Intrahepatic biliary dilatation present. Portal vein is patent on color Doppler imaging with normal direction of blood flow towards the liver. Other: No RIGHT upper quadrant free fluid. IMPRESSION: Post cholecystectomy. Intrahepatic and extrahepatic biliary dilatation with CBD now 19 mm, increased since 10/23/2020; recommend correlation with LFTs. Electronically Signed   By: Lavonia Dana M.D.   On: 12/04/2020 11:54    Scheduled Meds: . ARIPiprazole  5 mg Oral QPM  . Chlorhexidine Gluconate Cloth  6 each Topical Q0600  . DULoxetine  60 mg Oral Daily  . enoxaparin (LOVENOX) injection  60 mg Subcutaneous Q24H  . fluconazole  200 mg Oral Daily  . gabapentin  600 mg Oral TID  . insulin aspart  0-9 Units Subcutaneous TID WC  . lidocaine  1 patch Transdermal Q24H  . lipase/protease/amylase  36,000 Units Oral TID WC  . mirtazapine  30 mg Oral QHS  . mometasone-formoterol  2 puff Inhalation BID  . montelukast  10 mg Oral q AM  . mupirocin ointment  1 application Nasal BID  . pantoprazole  40 mg Oral Daily  . propranolol ER  80 mg Oral Daily   Continuous Infusions: . DAPTOmycin (CUBICIN)  IV 1,000 mg (12/04/20 1952)  PRN Meds:.acetaminophen, albuterol, oxyCODONE   ASSESMENT:   *  Suspicion of choledocholithiasis.  Filling defects in dilated CBD, dilated intrahepatic ducts on [place] and MRCP.  Rising LFTs.  Abdominal pain, nausea, vomiting.    LFTs improved over last 24 hours.    *   Chronic, calcific pancreatitis.  Not particularly symptomatic at baseline. No alcohol for 14 months.  *   Osteomyelitis, spine, hip.  Nasal swab + for MRSA.  Daptomycin in place.    *   Esophageal candidiasis per 11/20/2020 EGD.  21-day course PO Diflucan in progress  PLAN   *   ERCP w Dr Rush Landmark tmrw at 1445.  Stopping Lovenox after todays dose at 1230, can resume after ERCP. solid food for the rest of today.  Clear liquid diet at breakfast but n.p.o. after 8 AM in the morning  Azucena Freed  12/05/2020, 10:15 AM Phone [place] attending  I have seen the patient as well.  Agree with the physician assistant assessment and plan.  Reviewed ERCP with patient as well.  Gatha Mayer, MD, [facility] 12/05/2020 4:19 PM

## 2020-12-05 NOTE — Plan of Care (Signed)

## 2020-12-05 NOTE — Progress Notes (Addendum)
Interim Progress Note  S: In to check on patient this evening. When asked how his hip was feeling he states "I haven't really thought about it" but then further states that the pain has worsened today. Rates pain 6/10 currently but states it was higher than that earlier today. Pain is worsened with movement of his right hip. He feels comfortable when he is not moving. Felt that the Toradol helped to control his pain but also notes that the pain now (off the Toradol) is not even comparable (meaning worse) than it was prior to admission when he also was not using Toradol.   O: Blood pressure 130/82, pulse 75, temperature 99.2 F (37.3 C), temperature source Oral, resp. rate 17, height 6\' 3"  (1.905 m), weight 115.3 kg, SpO2 94 %. Gen: Awake, in no distress, laying in bed watching TV, pleasant MSK: limited AROM of right lower extremity, specifically with hip and knee flexion. Normal AROM of left hip and knee. Pain with both internal and external rotation of right hip. No edema or deformities felt on either hips.  Ext: No lower extremity edema, 2+ radial and DP pulses b/l  A/P: 50 y/o male admitted for MRSA bacteremia and osteomyelitis now with additional concern of pyomyositis of right hip.   Right Hip Pain MRI obtained 3/21 with prominent edema and enhancement of the right adductor minimus muscle adjacent to superior pubic ramus concerning for myositis without abscess. Per ID note, recommend patient should have repeat MRI if worsening pain of his hip though it would be too early to do this now. Recommendation was for repeat next week. Of note, there have also been some recent changes to his pain regimen. He was previously on Toradol which was discontinued today given completion of 5-day course. Toradol seemed to ease his pain the most. Pain regimen now with Acetaminophen 650 mg q6h PRN mild-moderate pain, tramadol q6h PRN moderate pain and oxycodone 10 mg q4h PRN breakthrough pain. Has required four doses  of oxycodone today (most recently at 2051) and has received two doses of tramadol (most recently at [place]).  -Given best response with NSAID will do 1x dose of 400 mg Ibuprofen  -Continue Protonix for GI ppx  -Consider increasing Tramadol to 50 mg if continued worsening pain  -Continue Daptomycin   For other conditions please refer to day team note.

## 2020-12-05 NOTE — Progress Notes (Addendum)
Inpatient Diabetes Program Recommendations  AACE/ADA: New Consensus Statement on Inpatient Glycemic Control (2015)  Target Ranges:  Prepandial:   less than 140 mg/dL      Peak postprandial:   less than 180 mg/dL (1-2 hours)      Critically ill patients:  140 - 180 mg/dL   Lab Results  Component Value Date   GLUCAP 299 (H) 12/05/2020   HGBA1C 9.7 (A) 10/23/2020    Review of Glycemic Control Results for Christopher Fernandez, Christopher Fernandez (MRN [id number]) as of 12/05/2020 12:55  Ref. Range 12/04/2020 11:53 12/04/2020 17:24 12/04/2020 19:39 12/05/2020 06:48 12/05/2020 11:41  Glucose-Capillary Latest Ref Range: 70 - 99 mg/dL 113 (H) 108 (H) 215 (H) 231 (H) 299 (H)   Inpatient Diabetes Program Recommendations:   Please consider 50% home basal insulin dose -Lantus 9 units qd -While NPO, change Novolog correction to q 4 hrs.  Thank you, Nani Gasser. Zaim Nitta, RN, MSN, CDE  Diabetes Coordinator Inpatient Glycemic Control Team Team Pager [phone] (8am-5pm) 12/05/2020 12:58 PM

## 2020-12-05 NOTE — Progress Notes (Signed)
Subjective:  Having some more pain in groin but thinks may be due to toradol being stoppedd  Antibiotics:  Anti-infectives (From admission, onward)   Start     Dose/Rate Route Frequency Ordered Stop   12/03/20 2100  DAPTOmycin (CUBICIN) 1,000 mg in sodium chloride 0.9 % IVPB        1,000 mg 240 mL/hr over 30 Minutes Intravenous Daily 12/03/20 1950     12/02/20 1630  ceFEPIme (MAXIPIME) 2 g in sodium chloride 0.9 % 100 mL IVPB  Status:  Discontinued        2 g 200 mL/hr over 30 Minutes Intravenous  Once 12/02/20 1609 12/02/20 1850   12/01/20 1845  fluconazole (DIFLUCAN) tablet 200 mg        200 mg Oral Daily 12/01/20 1753 12/18/20 2359   12/01/20 1400  ceFEPIme (MAXIPIME) 2 g in sodium chloride 0.9 % 100 mL IVPB  Status:  Discontinued        2 g 200 mL/hr over 30 Minutes Intravenous Every 8 hours 12/01/20 1007 12/02/20 0910   12/01/20 1045  metroNIDAZOLE (FLAGYL) tablet 500 mg  Status:  Discontinued        500 mg Oral Every 12 hours 12/01/20 0951 12/02/20 0910   12/01/20 0500  vancomycin (VANCOREADY) IVPB 1250 mg/250 mL  Status:  Discontinued        1,250 mg 166.7 mL/hr over 90 Minutes Intravenous Every 12 hours 11/30/20 1641 12/03/20 1918   11/30/20 1645  vancomycin (VANCOREADY) IVPB 1500 mg/300 mL        1,500 mg 150 mL/hr over 120 Minutes Intravenous  Once 11/30/20 1556 11/30/20 1925   11/30/20 1645  piperacillin-tazobactam (ZOSYN) IVPB 3.375 g  Status:  Discontinued        3.375 g 12.5 mL/hr over 240 Minutes Intravenous Every 8 hours 11/30/20 1556 12/01/20 0951   11/30/20 1000  fluconazole (DIFLUCAN) tablet 100 mg  Status:  Discontinued        100 mg Oral Daily 11/29/20 1916 11/29/20 1952      Medications: Scheduled Meds: . ARIPiprazole  5 mg Oral QPM  . Chlorhexidine Gluconate Cloth  6 each Topical Q0600  . DULoxetine  60 mg Oral Daily  . enoxaparin (LOVENOX) injection  60 mg Subcutaneous Q24H  . fluconazole  200 mg Oral Daily  . gabapentin  600 mg Oral TID   . insulin aspart  0-9 Units Subcutaneous TID WC  . lidocaine  1 patch Transdermal Q24H  . lipase/protease/amylase  36,000 Units Oral TID WC  . mirtazapine  30 mg Oral QHS  . mometasone-formoterol  2 puff Inhalation BID  . montelukast  10 mg Oral q AM  . mupirocin ointment  1 application Nasal BID  . pantoprazole  40 mg Oral Daily  . propranolol ER  80 mg Oral Daily   Continuous Infusions: . DAPTOmycin (CUBICIN)  IV 1,000 mg (12/04/20 1952)   PRN Meds:.acetaminophen, albuterol, oxyCODONE, traMADol    Objective: Weight change:   Intake/Output Summary (Last 24 hours) at 12/05/2020 1816 Last data filed at 12/05/2020 1332 Gross per 24 hour  Intake 960 ml  Output --  Net 960 ml   Blood pressure (!) 132/94, pulse 78, temperature 98.4 F (36.9 C), temperature source Oral, resp. rate 18, height 6\' 3"  (1.905 m), weight 115.3 kg, SpO2 97 %. Temp:  [98.3 F (36.8 C)-98.6 F (37 C)] 98.4 F (36.9 C) (03/24 1710) Pulse Rate:  [68-89] 78 (03/24 1710) Resp:  [  18] 18 (03/24 1710) BP: (115-157)/(71-98) 132/94 (03/24 1710) SpO2:  [94 %-100 %] 97 % (03/24 1710)  Physical Exam:  aox 3 NAD CV: rrr Pulm: no wheezes, resp distress GI: soft nd,  Ext: no new deformities, amputation site bandaged Neuro nonfocal  CBC:    BMET Recent Labs    12/04/20 0907 12/05/20 0500  NA 135 131*  K 4.3 4.1  CL 102 98  CO2 28 25  GLUCOSE 174* 295*  BUN 11 11  CREATININE 0.78 0.79  CALCIUM 8.8* 8.4*     Liver Panel  Recent Labs    12/04/20 0907 12/05/20 0500  PROT 7.0 6.3*  ALBUMIN 2.4* 2.4*  AST 80* 31  ALT 150* 86*  ALKPHOS 212* 166*  BILITOT 0.6 0.3       Sedimentation Rate No results for input(s): ESRSEDRATE in the last 72 hours. C-Reactive Protein No results for input(s): CRP in the last 72 hours.  Micro Results: Recent Results (from the past 720 hour(s))  Culture, blood (routine x 2)     Status: Abnormal   Collection Time: 11/29/20  7:37 PM   Specimen: BLOOD  LEFT ARM  Result Value Ref Range Status   Specimen Description BLOOD LEFT ARM  Final   Special Requests   Final    BOTTLES DRAWN AEROBIC ONLY Blood Culture adequate volume   Culture  Setup Time   Final    GRAM POSITIVE COCCI IN CLUSTERS AEROBIC BOTTLE ONLY CRITICAL RESULT CALLED TO, READ BACK BY AND VERIFIED WITH: L,CHEN PHARMD @1951  11/30/20 EB Performed at [facility], [place] [address] [place], [place] [address]    Culture METHICILLIN RESISTANT STAPHYLOCOCCUS AUREUS (A)  Final   Report Status 12/02/2020 FINAL  Final   Organism ID, Bacteria METHICILLIN RESISTANT STAPHYLOCOCCUS AUREUS  Final      Susceptibility   Methicillin resistant staphylococcus aureus - MIC*    CIPROFLOXACIN <=0.5 SENSITIVE Sensitive     ERYTHROMYCIN >=8 RESISTANT Resistant     GENTAMICIN <=0.5 SENSITIVE Sensitive     OXACILLIN >=4 RESISTANT Resistant     TETRACYCLINE 2 SENSITIVE Sensitive     VANCOMYCIN 1 SENSITIVE Sensitive     TRIMETH/SULFA <=10 SENSITIVE Sensitive     CLINDAMYCIN <=0.25 SENSITIVE Sensitive     RIFAMPIN <=0.5 SENSITIVE Sensitive     Inducible Clindamycin NEGATIVE Sensitive     * METHICILLIN RESISTANT STAPHYLOCOCCUS AUREUS  Culture, blood (routine x 2)     Status: Abnormal   Collection Time: 11/29/20  7:37 PM   Specimen: BLOOD LEFT ARM  Result Value Ref Range Status   Specimen Description BLOOD LEFT ARM  Final   Special Requests   Final    BOTTLES DRAWN AEROBIC ONLY Blood Culture adequate volume   Culture  Setup Time   Final    GRAM POSITIVE COCCI IN CLUSTERS AEROBIC BOTTLE ONLY CRITICAL VALUE NOTED.  VALUE IS CONSISTENT WITH PREVIOUSLY REPORTED AND CALLED VALUE.    Culture (A)  Final    STAPHYLOCOCCUS AUREUS SUSCEPTIBILITIES PERFORMED ON PREVIOUS CULTURE WITHIN THE LAST 5 DAYS. Performed at [facility], [place] [address] [place], [place] [address]    Report Status 12/02/2020 FINAL  Final  Blood Culture ID Panel (Reflexed)     Status: Abnormal   Collection Time:  11/29/20  7:37 PM  Result Value Ref Range Status   Enterococcus faecalis NOT DETECTED NOT DETECTED Final   Enterococcus Faecium NOT DETECTED NOT DETECTED Final   Listeria monocytogenes NOT DETECTED NOT  DETECTED Final   Staphylococcus species DETECTED (A) NOT DETECTED Final    Comment: CRITICAL RESULT CALLED TO, READ BACK BY AND VERIFIED WITH: L,CHEN PHARMD @1953  11/30/20 EB    Staphylococcus aureus (BCID) DETECTED (A) NOT DETECTED Final    Comment: Methicillin (oxacillin)-resistant Staphylococcus aureus (MRSA). MRSA is predictably resistant to beta-lactam antibiotics (except ceftaroline). Preferred therapy is vancomycin unless clinically contraindicated. Patient requires contact precautions if  hospitalized. CRITICAL RESULT CALLED TO, READ BACK BY AND VERIFIED WITH: L,CHEN PHARMD @1951  11/30/20 EB    Staphylococcus epidermidis NOT DETECTED NOT DETECTED Final   Staphylococcus lugdunensis NOT DETECTED NOT DETECTED Final   Streptococcus species NOT DETECTED NOT DETECTED Final   Streptococcus agalactiae NOT DETECTED NOT DETECTED Final   Streptococcus pneumoniae NOT DETECTED NOT DETECTED Final   Streptococcus pyogenes NOT DETECTED NOT DETECTED Final   A.calcoaceticus-baumannii NOT DETECTED NOT DETECTED Final   Bacteroides fragilis NOT DETECTED NOT DETECTED Final   Enterobacterales NOT DETECTED NOT DETECTED Final   Enterobacter cloacae complex NOT DETECTED NOT DETECTED Final   Escherichia coli NOT DETECTED NOT DETECTED Final   Klebsiella aerogenes NOT DETECTED NOT DETECTED Final   Klebsiella oxytoca NOT DETECTED NOT DETECTED Final   Klebsiella pneumoniae NOT DETECTED NOT DETECTED Final   Proteus species NOT DETECTED NOT DETECTED Final   Salmonella species NOT DETECTED NOT DETECTED Final   Serratia marcescens NOT DETECTED NOT DETECTED Final   Haemophilus influenzae NOT DETECTED NOT DETECTED Final   Neisseria meningitidis NOT DETECTED NOT DETECTED Final   Pseudomonas aeruginosa NOT DETECTED  NOT DETECTED Final   Stenotrophomonas maltophilia NOT DETECTED NOT DETECTED Final   Candida albicans NOT DETECTED NOT DETECTED Final   Candida auris NOT DETECTED NOT DETECTED Final   Candida glabrata NOT DETECTED NOT DETECTED Final   Candida krusei NOT DETECTED NOT DETECTED Final   Candida parapsilosis NOT DETECTED NOT DETECTED Final   Candida tropicalis NOT DETECTED NOT DETECTED Final   Cryptococcus neoformans/gattii NOT DETECTED NOT DETECTED Final   Meth resistant mecA/C and MREJ DETECTED (A) NOT DETECTED Final    Comment: CRITICAL RESULT CALLED TO, READ BACK BY AND VERIFIED WITH: L,CHEN PHARMD @1953  11/30/20 EB Performed at [facility], [address], [place], [place] [address]   Culture, Urine     Status: None   Collection Time: 11/29/20  9:25 PM   Specimen: Urine, Random  Result Value Ref Range Status   Specimen Description URINE, RANDOM  Final   Special Requests NONE  Final   Culture   Final    NO GROWTH Performed at [facility], [place] [address], [place], [place] [address]    Report Status 12/01/2020 FINAL  Final  Culture, blood (routine x 2)     Status: None (Preliminary result)   Collection Time: 12/01/20 10:16 AM   Specimen: BLOOD LEFT HAND  Result Value Ref Range Status   Specimen Description BLOOD LEFT HAND  Final   Special Requests   Final    BOTTLES DRAWN AEROBIC AND ANAEROBIC Blood Culture results may not be optimal due to an inadequate volume of blood received in culture bottles   Culture   Final    NO GROWTH 4 DAYS Performed at [facility], [place] [address] [place], [place] [address]    Report Status PENDING  Incomplete  Culture, blood (routine x 2)     Status: Abnormal   Collection Time: 12/01/20 10:23 AM   Specimen: BLOOD RIGHT HAND  Result Value Ref  Range Status   Specimen Description BLOOD RIGHT HAND  Final   Special Requests   Final    BOTTLES DRAWN AEROBIC AND ANAEROBIC Blood Culture adequate volume   Culture  Setup Time   Final     ANAEROBIC BOTTLE ONLY GRAM POSITIVE COCCI CRITICAL VALUE NOTED.  VALUE IS CONSISTENT WITH PREVIOUSLY REPORTED AND CALLED VALUE.    Culture (A)  Final    STAPHYLOCOCCUS AUREUS SUSCEPTIBILITIES PERFORMED ON PREVIOUS CULTURE WITHIN THE LAST 5 DAYS. Performed at [facility], [place] [address], [place], [place] [address]    Report Status 12/05/2020 FINAL  Final  Surgical pcr screen     Status: Abnormal   Collection Time: 12/01/20 10:15 PM   Specimen: Nasal Mucosa; Nasal Swab  Result Value Ref Range Status   MRSA, PCR POSITIVE (A) NEGATIVE Final    Comment: RESULT CALLED TO, READ BACK BY AND VERIFIED WITH: ROGERS,P RN 0001 12/02/2020 MITCHELL,L    Staphylococcus aureus POSITIVE (A) NEGATIVE Final    Comment: (NOTE) The Xpert SA Assay (FDA approved for NASAL specimens in patients 33 years of age and older), is one component of a comprehensive surveillance program. It is not intended to diagnose infection nor to guide or monitor treatment. Performed at [facility], [place] [address], [place],  [address]   Resp Panel by RT-PCR (Flu A&B, Covid) Nasopharyngeal Swab     Status: None   Collection Time: 12/02/20  8:15 AM   Specimen: Nasopharyngeal Swab; Nasopharyngeal(NP) swabs in vial transport medium  Result Value Ref Range Status   SARS Coronavirus 2 by RT PCR NEGATIVE NEGATIVE Final    Comment: (NOTE) SARS-CoV-2 target nucleic acids are NOT DETECTED.  The SARS-CoV-2 RNA is generally detectable in upper respiratory specimens during the acute phase of infection. The lowest concentration of SARS-CoV-2 viral copies this assay can detect is 138 copies/mL. A negative result does not preclude SARS-Cov-2 infection and should not be used as the sole basis for treatment or other patient management decisions. A negative result may occur with  improper specimen collection/handling, submission of specimen other than nasopharyngeal swab, presence of viral mutation(s) within the areas  targeted by this assay, and inadequate number of viral copies(<138 copies/mL). A negative result must be combined with clinical observations, patient history, and epidemiological information. The expected result is Negative.  Fact Sheet for Patients:  EntrepreneurPulse.com.au  Fact Sheet for Healthcare Providers:  IncredibleEmployment.be  This test is no t yet approved or cleared by the [place] FDA and  has been authorized for detection and/or diagnosis of SARS-CoV-2 by FDA under an Emergency Use Authorization (EUA). This EUA will remain  in effect (meaning this test can be used) for the duration of the COVID-19 declaration under Section 564(b)(1) of the Act, 21 U.S.C.section 360bbb-3(b)(1), unless the authorization is terminated  or revoked sooner.       Influenza A by PCR NEGATIVE NEGATIVE Final   Influenza B by PCR NEGATIVE NEGATIVE Final    Comment: (NOTE) The Xpert Xpress SARS-CoV-2/FLU/RSV plus assay is intended as an aid in the diagnosis of influenza from Nasopharyngeal swab specimens and should not be used as a sole basis for treatment. Nasal washings and aspirates are unacceptable for Xpert Xpress SARS-CoV-2/FLU/RSV testing.  Fact Sheet for Patients: EntrepreneurPulse.com.au  Fact Sheet for Healthcare Providers: IncredibleEmployment.be  This test is not yet approved or cleared by the [place] FDA and has been authorized for detection and/or diagnosis of SARS-CoV-2 by FDA under an Emergency  Use Authorization (EUA). This EUA will remain in effect (meaning this test can be used) for the duration of the COVID-19 declaration under Section 564(b)(1) of the Act, 21 U.S.C. section 360bbb-3(b)(1), unless the authorization is terminated or revoked.  Performed at [facility], [place] [address], [place], [place] [address]   Aerobic/Anaerobic Culture w Gram Stain (surgical/deep wound)      Status: None (Preliminary result)   Collection Time: 12/02/20  4:34 PM   Specimen: PATH Digit amputation; Tissue  Result Value Ref Range Status   Specimen Description TISSUE LEFT TOE  Final   Special Requests PATIENT ON FOLLOWING CEFEPIME MAXIPIME VANC  Final   Gram Stain   Final    RARE WBC PRESENT, PREDOMINANTLY PMN NO ORGANISMS SEEN    Culture   Final    NO GROWTH 3 DAYS NO ANAEROBES ISOLATED; CULTURE IN PROGRESS FOR 5 DAYS Performed at [facility], [place] [address] [place], [place] [address]    Report Status PENDING  Incomplete  Culture, blood (routine x 2)     Status: None (Preliminary result)   Collection Time: 12/04/20 12:14 PM   Specimen: BLOOD  Result Value Ref Range Status   Specimen Description BLOOD LEFT ANTECUBITAL  Final   Special Requests   Final    BOTTLES DRAWN AEROBIC AND ANAEROBIC Blood Culture adequate volume   Culture   Final    NO GROWTH < 24 HOURS Performed at [facility], [place] [address] [place], [place] [address]    Report Status PENDING  Incomplete  Culture, blood (routine x 2)     Status: None (Preliminary result)   Collection Time: 12/04/20 12:14 PM   Specimen: BLOOD LEFT FOREARM  Result Value Ref Range Status   Specimen Description BLOOD LEFT FOREARM  Final   Special Requests   Final    BOTTLES DRAWN AEROBIC AND ANAEROBIC Blood Culture adequate volume   Culture   Final    NO GROWTH < 24 HOURS Performed at [facility], [place] [address], [place], [place] [address]    Report Status PENDING  Incomplete    Studies/Results: ECHO TEE  Result Date: 12/04/2020    TRANSESOPHOGEAL ECHO REPORT   Patient Name:   Christopher Fernandez Date of Exam: 12/04/2020 Medical Rec #:  [id number]      Height:       75.0 in Accession #:    [id number]     Weight:       254.2 lb Date of Birth:  [birth year]       BSA:          2.430 m Patient Age:    50 years       BP:           122/71 mmHg Patient Gender: M              HR:           78 bpm. Exam Location:   Inpatient Procedure: Transesophageal Echo, Cardiac Doppler and Color Doppler Indications:     Bacteremia  History:         Patient has prior history of Echocardiogram examinations, most                  recent 12/01/2020. Risk Factors:Diabetes. MRSA.  Sonographer:     Dustin Flock Referring Phys:  [phone] Karoline Caldwell Diagnosing Phys: [name] MD PROCEDURE: The transesophogeal probe was passed without difficulty through the esophogus of  the patient. Sedation performed by performing physician. The patient was monitored while under deep sedation. Anesthestetic sedation was provided intravenously by  Anesthesiology: 244.84mg  of Propofol, 100mg  of Lidocaine. The patient developed no complications during the procedure. IMPRESSIONS  1. Left ventricular ejection fraction, by estimation, is 55 to 60%. The left ventricle has normal function. The left ventricle has no regional wall motion abnormalities.  2. Right ventricular systolic function is normal. The right ventricular size is normal.  3. No left atrial/left atrial appendage thrombus was detected.  4. The mitral valve is normal in structure. Trivial mitral valve regurgitation.  5. The aortic valve is normal in structure. Aortic valve regurgitation is not visualized. FINDINGS  Left Ventricle: Left ventricular ejection fraction, by estimation, is 55 to 60%. The left ventricle has normal function. The left ventricle has no regional wall motion abnormalities. The left ventricular internal cavity size was normal in size. Right Ventricle: The right ventricular size is normal. No increase in right ventricular wall thickness. Right ventricular systolic function is normal. Left Atrium: Left atrial size was normal in size. No left atrial/left atrial appendage thrombus was detected. Right Atrium: Right atrial size was normal in size. Pericardium: There is no evidence of pericardial effusion. Mitral Valve: The mitral valve is normal in structure. Trivial mitral valve  regurgitation. There is no evidence of mitral valve vegetation. Tricuspid Valve: The tricuspid valve is grossly normal. Tricuspid valve regurgitation is mild. Aortic Valve: The aortic valve is normal in structure. Aortic valve regurgitation is not visualized. There is no evidence of aortic valve vegetation. Pulmonic Valve: The pulmonic valve was normal in structure. Pulmonic valve regurgitation is trivial. Aorta: The aortic root and ascending aorta are structurally normal, with no evidence of dilitation. IAS/Shunts: No atrial level shunt detected by color flow Doppler. [name] MD Electronically signed by [name] MD Signature Date/Time: 12/04/2020/3:22:28 PM    Final    US Abdomen Limited RUQ (LIVER/GB)  Result Date: 12/04/2020 CLINICAL DATA:  Abdominal pain, history COPD, diabetes mellitus, hypertension EXAM: ULTRASOUND ABDOMEN LIMITED RIGHT UPPER QUADRANT COMPARISON:  CT abdomen pelvis 10/23/2020 FINDINGS: Gallbladder: Surgically absent Common bile duct: Diameter: 19 mm diameter, dilated, increased from the 14 mm on the prior CT. No definite common duct stones. Liver: Normal parenchymal echogenicity. No mass or nodularity. Intrahepatic biliary dilatation present. Portal vein is patent on color Doppler imaging with normal direction of blood flow towards the liver. Other: No RIGHT upper quadrant free fluid. IMPRESSION: Post cholecystectomy. Intrahepatic and extrahepatic biliary dilatation with CBD now 19 mm, increased since 10/23/2020; recommend correlation with LFTs. Electronically Signed   By: Lavonia Dana M.D.   On: 12/04/2020 11:54      Assessment/Plan:  INTERVAL HISTORY:  Since I saw him this am he had TEE, he is also to have ERCP   Principal Problem:   Osteomyelitis ([place]) Active Problems:   Uncontrolled diabetes mellitus with hyperglycemia, with long-term current use of insulin (HCC)   Asthma, intermittent   Diabetic foot infection ([place])   MRSA bacteremia   Right hip pain    Infective myositis of right lower extremity   Acute osteomyelitis of right pelvic region and thigh (HCC)   Discitis of thoracolumbar region   Pericardial effusion   Thrush   Bacteremia   Abdominal pain   Choledocholithiasis    Christopher Fernandez is a 50 y.o. male with with diabetes mellitus I saw in 2021 when he had a nonhealing wound and underwent a right transmetatarsal amputation which we  treated with vancomycin cefepime and Flagyl in the past.  He was admitted with back pain and found on MRI to have T9-T10 discitis and osteomyelitis.  He also has clear necrotic infection involving his second toe.  Blood cultures have been positive for methicillin-resistant Staphylococcus aureu and have been persistently positive on first check of blood cultures but appears to be clearing now.  MRI of the foot shows evidence of osteomyelitis in the distal phalanx of the second toe as well as edema enhancement in the proximal middle phalange of the second toe and plantar surface of the head of the second metatarsal  There was also edema and enhancement in the head and neck of the first metatarsal and medial and lateral sesamoids that were also working for osteomyelitis.  Vascular surgery were aware of MRI findings and Dr. Oneida Alar felt that there is no indication to intervene upon this site seen on the MRI at site of prior amputation but did amputate the second toe along with the metatarsal head.  TEE is negative for vegetations and there is no pericardial effusion seen.  Also agree that he is not need IR guided biopsy of his disc space as this procedure would be low yield and he undoubtedly has infection with MRSA there as well.  The MRI of the hip is concerning.  I do not see something that orthopedic surgery would intervene upon at this time but this area should be carefully monitored and if he develops worsening pain he should have a repeat MRI to look for pyomyositis though   It is too soon now to look  however.   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  [address], which is located to the left of the elevators.  Phone: [phone]  Fax: [phone]  [email or web address]   He will need to arrive 15 minutes piror to his appointment.     LOS: 6 days   Alcide Evener 12/05/2020, 6:16 PM

## 2020-12-05 NOTE — Plan of Care (Signed)
  Problem: Safety: Goal: Ability to remain free from injury will improve Outcome: Progressing   

## 2020-12-05 NOTE — H&P (View-Only) (Signed)
Daily Rounding Note  12/05/2020, 10:15 AM  LOS: 6 days   SUBJECTIVE:   Chief complaint: Abdominal pain.  Choledocholithiasis.  Pain across the mid abdomen bilaterally, worse on the right.  Pain in his mid back and some in his right hip. No nausea or vomiting.  This has not been an issue for a few weeks.  Tolerating solid food.  Brown stool yesterday.  OBJECTIVE:         Vital signs in last 24 hours:    Temp:  [98.2 F (36.8 C)-98.7 F (37.1 C)] 98.6 F (37 C) (03/24 0812) Pulse Rate:  [59-89] 89 (03/24 0829) Resp:  [8-18] 18 (03/24 0829) BP: (115-185)/(71-101) 118/71 (03/24 0812) SpO2:  [90 %-100 %] 94 % (03/24 0829) Weight:  [115.3 kg] 115.3 kg (03/23 1315) Last BM Date: 12/03/20 Filed Weights   12/02/20 1744 12/04/20 1315  Weight: 115.3 kg 115.3 kg   General: Pleasant, comfortable, nonill appearing Heart: RRR. Chest: Clear bilaterally.  No dyspnea or cough Abdomen: Small incisional/ventral hernia just superior to the navel at the lower aspect of the long midline surgical scar.  Slight right upper quadrant tenderness without guarding or rebound.  Minor left abdominal tenderness. Extremities: No CCE.  Neuro/Psych: Walking in the room unassisted.  Pleasant, calm, fluid speech.  No gross deficits.  No tremors.   Lab Results: Recent Labs    12/03/20 0833 12/04/20 0907 12/05/20 0500  WBC 8.2 7.3 7.3  HGB 12.0* 10.6* 10.4*  HCT 36.6* 32.7* 31.3*  PLT 459* 546* 509*   BMET Recent Labs    12/03/20 0833 12/04/20 0907 12/05/20 0500  NA 136 135 131*  K 4.2 4.3 4.1  CL 103 102 98  CO2 26 28 25   GLUCOSE 177* 174* 295*  BUN 12 11 11   CREATININE 0.83 0.78 0.79  CALCIUM 9.0 8.8* 8.4*   LFT Recent Labs    12/04/20 0907 12/05/20 0500  PROT 7.0 6.3*  ALBUMIN 2.4* 2.4*  AST 80* 31  ALT 150* 86*  ALKPHOS 212* 166*  BILITOT 0.6 0.3    Studies/Results: ECHO TEE  Result Date: 12/04/2020     TRANSESOPHOGEAL ECHO REPORT   Patient Name:   MASASHI SNOWDON Date of Exam: 12/04/2020 Medical Rec #:  [id number]      Height:       75.0 in Accession #:    [id number]     Weight:       254.2 lb Date of Birth:  July 28, 1971       BSA:          2.430 m Patient Age:    50 years       BP:           122/71 mmHg Patient Gender: M              HR:           78 bpm. Exam Location:  Inpatient Procedure: Transesophageal Echo, Cardiac Doppler and Color Doppler Indications:     Bacteremia  History:         Patient has prior history of Echocardiogram examinations, most                  recent 12/01/2020. Risk Factors:Diabetes. MRSA.  Sonographer:     Dustin Flock Referring Phys:  [phone] Karoline Caldwell Diagnosing Phys: [name] MD PROCEDURE: The transesophogeal probe was passed without difficulty through the esophogus of the patient. Sedation performed  by performing physician. The patient was monitored while under deep sedation. Anesthestetic sedation was provided intravenously by  Anesthesiology: 244.84mg  of Propofol, 100mg  of Lidocaine. The patient developed no complications during the procedure. IMPRESSIONS  1. Left ventricular ejection fraction, by estimation, is 55 to 60%. The left ventricle has normal function. The left ventricle has no regional wall motion abnormalities.  2. Right ventricular systolic function is normal. The right ventricular size is normal.  3. No left atrial/left atrial appendage thrombus was detected.  4. The mitral valve is normal in structure. Trivial mitral valve regurgitation.  5. The aortic valve is normal in structure. Aortic valve regurgitation is not visualized. FINDINGS  Left Ventricle: Left ventricular ejection fraction, by estimation, is 55 to 60%. The left ventricle has normal function. The left ventricle has no regional wall motion abnormalities. The left ventricular internal cavity size was normal in size. Right Ventricle: The right ventricular size is normal. No increase in right  ventricular wall thickness. Right ventricular systolic function is normal. Left Atrium: Left atrial size was normal in size. No left atrial/left atrial appendage thrombus was detected. Right Atrium: Right atrial size was normal in size. Pericardium: There is no evidence of pericardial effusion. Mitral Valve: The mitral valve is normal in structure. Trivial mitral valve regurgitation. There is no evidence of mitral valve vegetation. Tricuspid Valve: The tricuspid valve is grossly normal. Tricuspid valve regurgitation is mild. Aortic Valve: The aortic valve is normal in structure. Aortic valve regurgitation is not visualized. There is no evidence of aortic valve vegetation. Pulmonic Valve: The pulmonic valve was normal in structure. Pulmonic valve regurgitation is trivial. Aorta: The aortic root and ascending aorta are structurally normal, with no evidence of dilitation. IAS/Shunts: No atrial level shunt detected by color flow Doppler. [name] MD Electronically signed by [name] MD Signature Date/Time: 12/04/2020/3:22:28 PM    Final    US Abdomen Limited RUQ (LIVER/GB)  Result Date: 12/04/2020 CLINICAL DATA:  Abdominal pain, history COPD, diabetes mellitus, hypertension EXAM: ULTRASOUND ABDOMEN LIMITED RIGHT UPPER QUADRANT COMPARISON:  CT abdomen pelvis 10/23/2020 FINDINGS: Gallbladder: Surgically absent Common bile duct: Diameter: 19 mm diameter, dilated, increased from the 14 mm on the prior CT. No definite common duct stones. Liver: Normal parenchymal echogenicity. No mass or nodularity. Intrahepatic biliary dilatation present. Portal vein is patent on color Doppler imaging with normal direction of blood flow towards the liver. Other: No RIGHT upper quadrant free fluid. IMPRESSION: Post cholecystectomy. Intrahepatic and extrahepatic biliary dilatation with CBD now 19 mm, increased since 10/23/2020; recommend correlation with LFTs. Electronically Signed   By: Lavonia Dana M.D.   On: 12/04/2020 11:54    Scheduled Meds: . ARIPiprazole  5 mg Oral QPM  . Chlorhexidine Gluconate Cloth  6 each Topical Q0600  . DULoxetine  60 mg Oral Daily  . enoxaparin (LOVENOX) injection  60 mg Subcutaneous Q24H  . fluconazole  200 mg Oral Daily  . gabapentin  600 mg Oral TID  . insulin aspart  0-9 Units Subcutaneous TID WC  . lidocaine  1 patch Transdermal Q24H  . lipase/protease/amylase  36,000 Units Oral TID WC  . mirtazapine  30 mg Oral QHS  . mometasone-formoterol  2 puff Inhalation BID  . montelukast  10 mg Oral q AM  . mupirocin ointment  1 application Nasal BID  . pantoprazole  40 mg Oral Daily  . propranolol ER  80 mg Oral Daily   Continuous Infusions: . DAPTOmycin (CUBICIN)  IV 1,000 mg (12/04/20 1952)  PRN Meds:.acetaminophen, albuterol, oxyCODONE   ASSESMENT:   *  Suspicion of choledocholithiasis.  Filling defects in dilated CBD, dilated intrahepatic ducts on [place] and MRCP.  Rising LFTs.  Abdominal pain, nausea, vomiting.    LFTs improved over last 24 hours.    *   Chronic, calcific pancreatitis.  Not particularly symptomatic at baseline. No alcohol for 14 months.  *   Osteomyelitis, spine, hip.  Nasal swab + for MRSA.  Daptomycin in place.    *   Esophageal candidiasis per 11/20/2020 EGD.  21-day course PO Diflucan in progress  PLAN   *   ERCP w Dr Rush Landmark tmrw at 1445.  Stopping Lovenox after todays dose at 1230, can resume after ERCP. solid food for the rest of today.  Clear liquid diet at breakfast but n.p.o. after 8 AM in the morning  Azucena Freed  12/05/2020, 10:15 AM Phone [place] attending  I have seen the patient as well.  Agree with the physician assistant assessment and plan.  Reviewed ERCP with patient as well.  Gatha Mayer, MD, [facility] 12/05/2020 4:19 PM

## 2020-12-06 ENCOUNTER — Encounter (HOSPITAL_COMMUNITY): Payer: Self-pay | Admitting: Family Medicine

## 2020-12-06 ENCOUNTER — Inpatient Hospital Stay (HOSPITAL_COMMUNITY): Payer: 59

## 2020-12-06 ENCOUNTER — Encounter (HOSPITAL_COMMUNITY)
Admission: AD | Disposition: A | Discharge status: Home Health | Payer: Self-pay | Source: Ambulatory Visit | Attending: Family Medicine

## 2020-12-06 ENCOUNTER — Inpatient Hospital Stay (HOSPITAL_COMMUNITY): Payer: 59 | Admitting: Certified Registered Nurse Anesthetist

## 2020-12-06 DIAGNOSIS — B9562 Methicillin resistant Staphylococcus aureus infection as the cause of diseases classified elsewhere: Secondary | ICD-10-CM | POA: Diagnosis not present

## 2020-12-06 DIAGNOSIS — R1011 Right upper quadrant pain: Secondary | ICD-10-CM | POA: Diagnosis not present

## 2020-12-06 DIAGNOSIS — K8689 Other specified diseases of pancreas: Secondary | ICD-10-CM

## 2020-12-06 DIAGNOSIS — M86151 Other acute osteomyelitis, right femur: Secondary | ICD-10-CM | POA: Diagnosis not present

## 2020-12-06 DIAGNOSIS — K2970 Gastritis, unspecified, without bleeding: Secondary | ICD-10-CM

## 2020-12-06 DIAGNOSIS — M8619 Other acute osteomyelitis, multiple sites: Secondary | ICD-10-CM | POA: Diagnosis not present

## 2020-12-06 DIAGNOSIS — R7881 Bacteremia: Secondary | ICD-10-CM | POA: Diagnosis not present

## 2020-12-06 HISTORY — PX: ENDOSCOPIC RETROGRADE CHOLANGIOPANCREATOGRAPHY (ERCP) WITH PROPOFOL: SHX5810

## 2020-12-06 HISTORY — PX: PANCREATIC STENT PLACEMENT: SHX5539

## 2020-12-06 HISTORY — PX: REMOVAL OF STONES: SHX5545

## 2020-12-06 HISTORY — PX: BIOPSY: SHX5522

## 2020-12-06 HISTORY — PX: BILIARY DILATION: SHX6850

## 2020-12-06 HISTORY — PX: SPHINCTEROTOMY: SHX5279

## 2020-12-06 LAB — COMPREHENSIVE METABOLIC PANEL
ALT: 79 U/L — ABNORMAL HIGH (ref 0–44)
AST: 46 U/L — ABNORMAL HIGH (ref 15–41)
Albumin: 2.4 g/dL — ABNORMAL LOW (ref 3.5–5.0)
Alkaline Phosphatase: 149 U/L — ABNORMAL HIGH (ref 38–126)
Anion gap: 9 (ref 5–15)
BUN: 11 mg/dL (ref 6–20)
CO2: 25 mmol/L (ref 22–32)
Calcium: 8.7 mg/dL — ABNORMAL LOW (ref 8.9–10.3)
Chloride: 100 mmol/L (ref 98–111)
Creatinine, Ser: 0.78 mg/dL (ref 0.61–1.24)
GFR, Estimated: >60 mL/min (ref >60)
Glucose, Bld: 196 mg/dL — ABNORMAL HIGH (ref 70–99)
Potassium: 4.2 mmol/L (ref 3.5–5.1)
Sodium: 134 mmol/L — ABNORMAL LOW (ref 135–145)
Total Bilirubin: 0.6 mg/dL (ref 0.3–1.2)
Total Protein: 6.5 g/dL (ref 6.5–8.1)

## 2020-12-06 LAB — GLUCOSE, CAPILLARY
Glucose-Capillary: 191 mg/dL — ABNORMAL HIGH (ref 70–99)
Glucose-Capillary: 141 mg/dL — ABNORMAL HIGH (ref 70–99)
Glucose-Capillary: 181 mg/dL — ABNORMAL HIGH (ref 70–99)
Glucose-Capillary: 387 mg/dL — ABNORMAL HIGH (ref 70–99)
Glucose-Capillary: 387 mg/dL — ABNORMAL HIGH (ref 70–99)

## 2020-12-06 LAB — CULTURE, BLOOD (ROUTINE X 2): Culture: NO GROWTH

## 2020-12-06 SURGERY — ENDOSCOPIC RETROGRADE CHOLANGIOPANCREATOGRAPHY (ERCP) WITH PROPOFOL
Anesthesia: General

## 2020-12-06 MED ORDER — ONDANSETRON HCL 4 MG/2ML IJ SOLN
INTRAMUSCULAR | Status: DC | PRN
  Administered 2020-12-06: 4 mg via INTRAVENOUS

## 2020-12-06 MED ORDER — CIPROFLOXACIN IN D5W 400 MG/200ML IV SOLN
INTRAVENOUS | Status: AC
  Filled 2020-12-06: qty 200

## 2020-12-06 MED ORDER — FENTANYL CITRATE (PF) 100 MCG/2ML IJ SOLN
INTRAMUSCULAR | Status: DC | PRN
  Administered 2020-12-06 (×2): 50 ug via INTRAVENOUS

## 2020-12-06 MED ORDER — INDOMETHACIN 50 MG RE SUPP
RECTAL | Status: DC | PRN
  Administered 2020-12-06: 100 mg via RECTAL

## 2020-12-06 MED ORDER — ACETAMINOPHEN 325 MG PO TABS
650.0000 mg | ORAL_TABLET | Freq: Four times a day (QID) | ORAL | Status: DC
  Administered 2020-12-06 – 2020-12-09 (×11): 650 mg via ORAL
  Filled 2020-12-06 (×12): qty 2

## 2020-12-06 MED ORDER — CIPROFLOXACIN IN D5W 400 MG/200ML IV SOLN
INTRAVENOUS | Status: DC | PRN
  Administered 2020-12-06: 400 mg via INTRAVENOUS

## 2020-12-06 MED ORDER — ROCURONIUM BROMIDE 10 MG/ML (PF) SYRINGE
PREFILLED_SYRINGE | INTRAVENOUS | Status: DC | PRN
  Administered 2020-12-06 (×2): 20 mg via INTRAVENOUS
  Administered 2020-12-06: 60 mg via INTRAVENOUS

## 2020-12-06 MED ORDER — DEXAMETHASONE SODIUM PHOSPHATE 10 MG/ML IJ SOLN
INTRAMUSCULAR | Status: DC | PRN
  Administered 2020-12-06: 5 mg via INTRAVENOUS

## 2020-12-06 MED ORDER — IBUPROFEN 200 MG PO TABS: 400.0000 mg | ORAL_TABLET | Freq: Four times a day (QID) | ORAL | Status: DC | PRN

## 2020-12-06 MED ORDER — IOPAMIDOL (ISOVUE-300) INJECTION 61%
INTRAVENOUS | Status: DC | PRN
  Administered 2020-12-06: 50 mL

## 2020-12-06 MED ORDER — IBUPROFEN 200 MG PO TABS: 400.0000 mg | ORAL_TABLET | Freq: Two times a day (BID) | ORAL | Status: DC | PRN

## 2020-12-06 MED ORDER — IBUPROFEN 200 MG PO TABS
400.0000 mg | ORAL_TABLET | Freq: Two times a day (BID) | ORAL | Status: DC
  Administered 2020-12-06: 400 mg via ORAL
  Filled 2020-12-06 (×2): qty 2

## 2020-12-06 MED ORDER — INDOMETHACIN 50 MG RE SUPP
RECTAL | Status: AC
  Filled 2020-12-06: qty 2

## 2020-12-06 MED ORDER — LACTATED RINGERS IV SOLN: INTRAVENOUS | Status: DC | PRN

## 2020-12-06 MED ORDER — PROPOFOL 10 MG/ML IV BOLUS
INTRAVENOUS | Status: DC | PRN
  Administered 2020-12-06: 200 mg via INTRAVENOUS

## 2020-12-06 MED ORDER — GLUCAGON HCL RDNA (DIAGNOSTIC) 1 MG IJ SOLR
INTRAMUSCULAR | Status: AC
  Filled 2020-12-06: qty 1

## 2020-12-06 MED ORDER — LIDOCAINE 2% (20 MG/ML) 5 ML SYRINGE
INTRAMUSCULAR | Status: DC | PRN
  Administered 2020-12-06: 60 mg via INTRAVENOUS

## 2020-12-06 MED ORDER — INSULIN ASPART 100 UNIT/ML ~~LOC~~ SOLN
0.0000 [IU] | Freq: Every day | SUBCUTANEOUS | Status: DC
  Administered 2020-12-06: 5 [IU] via SUBCUTANEOUS

## 2020-12-06 MED ORDER — GLUCAGON HCL RDNA (DIAGNOSTIC) 1 MG IJ SOLR
INTRAMUSCULAR | Status: DC | PRN
  Administered 2020-12-06 (×4): .25 mg via INTRAVENOUS

## 2020-12-06 MED ORDER — SUGAMMADEX SODIUM 200 MG/2ML IV SOLN
INTRAVENOUS | Status: DC | PRN
  Administered 2020-12-06: 200 mg via INTRAVENOUS

## 2020-12-06 NOTE — Progress Notes (Signed)
Interim Progress Note  S: Christopher Fernandez states that he feels fine this evening. He denies abdominal pain. Was able to eat dinner without difficulty. Has some back pain but it is not new and states it is from the infection. He denies nausea or vomiting.  O: Blood pressure 115/76, pulse 69, temperature 98.8 F (37.1 C), resp. rate 16, height 6\' 3"  (1.905 m), weight 115.3 kg, SpO2 96 %. Gen: Awake, alert, laying in bed. Cardio: RRR without murmur Resp: breathing comfortably on room air without accessory muscle use, speaking in full sentences without difficulty  Abd: soft, non-distended, mild tenderness to palpation of LUQ, RUQ regions, no epigastric tenderness, no rebound or guarding.  A/P: 51 year old male admitted for MRSA bacteremia and osteomyelitis of T9/T10 and left 2nd toe. Patient has a history of chronic pancreatitis and underwent ERCP earlier today. Fortunately he does not have any symptoms concerning for acute on chronic pancreatitis. He was able to tolerate PO after the procedure without difficulty.  -Continue to monitor -Plan per prior FPTS progress note

## 2020-12-06 NOTE — Interval H&P Note (Signed)
History and Physical Interval Note:  12/06/2020 11:51 AM  Christopher Fernandez  has presented today for surgery, with the diagnosis of Choledocholithiasis.  Elevated LFTs.  The various methods of treatment have been discussed with the patient and family. After consideration of risks, benefits and other options for treatment, the patient has consented to  Procedure(s): ENDOSCOPIC RETROGRADE CHOLANGIOPANCREATOGRAPHY (ERCP) WITH PROPOFOL (N/A) as a surgical intervention.  The patient's history has been reviewed, patient examined, no change in status, stable for surgery.  I have reviewed the patient's chart and labs.  Questions were answered to the patient's satisfaction.     [employer]

## 2020-12-06 NOTE — Anesthesia Procedure Notes (Signed)
Procedure Name: Intubation Date/Time: 12/06/2020 1:05 PM Performed by: Inda Coke, CRNA Pre-anesthesia Checklist: Patient identified, Emergency Drugs available, Suction available and Patient being monitored Patient Re-evaluated:Patient Re-evaluated prior to induction Oxygen Delivery Method: Circle System Utilized Preoxygenation: Pre-oxygenation with 100% oxygen Induction Type: IV induction Ventilation: Mask ventilation without difficulty Laryngoscope Size: Mac and 4 Grade View: Grade I Tube type: Oral Tube size: 7.5 mm Number of attempts: 1 Airway Equipment and Method: Stylet and Oral airway Placement Confirmation: ETT inserted through vocal cords under direct vision,  positive ETCO2 and breath sounds checked- equal and bilateral Secured at: 23 cm Tube secured with: Tape Dental Injury: Teeth and Oropharynx as per pre-operative assessment

## 2020-12-06 NOTE — Progress Notes (Signed)
Subjective: Increasing pain in the groin   Antibiotics:  Anti-infectives (From admission, onward)   Start     Dose/Rate Route Frequency Ordered Stop   12/03/20 2100  [MAR Hold]  DAPTOmycin (CUBICIN) 1,000 mg in sodium chloride 0.9 % IVPB        (MAR Hold since Fri 12/06/2020 at 1220.Hold Reason: Transfer to a Procedural area.)   1,000 mg 240 mL/hr over 30 Minutes Intravenous Daily 12/03/20 1950     12/02/20 1630  ceFEPIme (MAXIPIME) 2 g in sodium chloride 0.9 % 100 mL IVPB  Status:  Discontinued        2 g 200 mL/hr over 30 Minutes Intravenous  Once 12/02/20 1609 12/02/20 1850   12/01/20 1845  [MAR Hold]  fluconazole (DIFLUCAN) tablet 200 mg        (MAR Hold since Fri 12/06/2020 at 1220.Hold Reason: Transfer to a Procedural area.)   200 mg Oral Daily 12/01/20 1753 12/18/20 2359   12/01/20 1400  ceFEPIme (MAXIPIME) 2 g in sodium chloride 0.9 % 100 mL IVPB  Status:  Discontinued        2 g 200 mL/hr over 30 Minutes Intravenous Every 8 hours 12/01/20 1007 12/02/20 0910   12/01/20 1045  metroNIDAZOLE (FLAGYL) tablet 500 mg  Status:  Discontinued        500 mg Oral Every 12 hours 12/01/20 0951 12/02/20 0910   12/01/20 0500  vancomycin (VANCOREADY) IVPB 1250 mg/250 mL  Status:  Discontinued        1,250 mg 166.7 mL/hr over 90 Minutes Intravenous Every 12 hours 11/30/20 1641 12/03/20 1918   11/30/20 1645  vancomycin (VANCOREADY) IVPB 1500 mg/300 mL        1,500 mg 150 mL/hr over 120 Minutes Intravenous  Once 11/30/20 1556 11/30/20 1925   11/30/20 1645  piperacillin-tazobactam (ZOSYN) IVPB 3.375 g  Status:  Discontinued        3.375 g 12.5 mL/hr over 240 Minutes Intravenous Every 8 hours 11/30/20 1556 12/01/20 0951   11/30/20 1000  fluconazole (DIFLUCAN) tablet 100 mg  Status:  Discontinued        100 mg Oral Daily 11/29/20 1916 11/29/20 1952      Medications: Scheduled Meds: . [MAR Hold] acetaminophen  650 mg Oral Q6H  . [MAR Hold] ARIPiprazole  5 mg Oral QPM  .  Chlorhexidine Gluconate Cloth  6 each Topical Q0600  . [MAR Hold] DULoxetine  60 mg Oral Daily  . [MAR Hold] fluconazole  200 mg Oral Daily  . [MAR Hold] gabapentin  600 mg Oral TID  . [MAR Hold] ibuprofen  400 mg Oral Q12H  . [MAR Hold] insulin aspart  0-9 Units Subcutaneous TID WC  . [MAR Hold] lidocaine  1 patch Transdermal Q24H  . [MAR Hold] lipase/protease/amylase  36,000 Units Oral TID WC  . [MAR Hold] mirtazapine  30 mg Oral QHS  . [MAR Hold] mometasone-formoterol  2 puff Inhalation BID  . [MAR Hold] montelukast  10 mg Oral q AM  . [MAR Hold] pantoprazole  40 mg Oral Daily  . [MAR Hold] propranolol ER  80 mg Oral Daily   Continuous Infusions: . [MAR Hold] DAPTOmycin (CUBICIN)  IV 1,000 mg (12/05/20 2055)   PRN Meds:.[MAR Hold] albuterol, [MAR Hold] oxyCODONE    Objective: Weight change:   Intake/Output Summary (Last 24 hours) at 12/06/2020 1242 Last data filed at 12/06/2020 0600 Gross per 24 hour  Intake 720.81 ml  Output --  Net  720.81 ml   Blood pressure (!) 153/94, pulse (!) 145, temperature 98.6 F (37 C), temperature source Temporal, resp. rate 18, height 6\' 3"  (1.905 m), weight 115.3 kg, SpO2 (!) 85 %. Temp:  [97.9 F (36.6 C)-99.2 F (37.3 C)] 98.6 F (37 C) (03/25 1233) Pulse Rate:  [56-145] 145 (03/25 1233) Resp:  [17-18] 18 (03/25 1233) BP: (130-153)/(79-94) 153/94 (03/25 0801) SpO2:  [85 %-97 %] 85 % (03/25 1233)  Physical Exam: Physical Exam Constitutional:      Appearance: He is well-developed.  HENT:     Head: Normocephalic and atraumatic.  Eyes:     Conjunctiva/sclera: Conjunctivae normal.  Cardiovascular:     Rate and Rhythm: Normal rate and regular rhythm.  Pulmonary:     Effort: Pulmonary effort is normal. No respiratory distress.     Breath sounds: No wheezing.  Abdominal:     General: There is no distension.     Palpations: Abdomen is soft.  Musculoskeletal:     Cervical back: Normal range of motion and neck supple.     Right hip:  Tenderness present. Decreased range of motion.       Legs:  Skin:    General: Skin is warm and dry.     Findings: No erythema or rash.  Neurological:     General: No focal deficit present.     Mental Status: He is alert and oriented to person, place, and time.  Psychiatric:        Mood and Affect: Mood normal.        Behavior: Behavior normal.        Thought Content: Thought content normal.        Judgment: Judgment normal.     Left foot visualized with necrotic toe and adjacent area where he had amputation    CBC:    BMET Recent Labs    12/05/20 0500 12/06/20 0154  NA 131* 134*  K 4.1 4.2  CL 98 100  CO2 25 25  GLUCOSE 295* 196*  BUN 11 11  CREATININE 0.79 0.78  CALCIUM 8.4* 8.7*     Liver Panel  Recent Labs    12/05/20 0500 12/06/20 0154  PROT 6.3* 6.5  ALBUMIN 2.4* 2.4*  AST 31 46*  ALT 86* 79*  ALKPHOS 166* 149*  BILITOT 0.3 0.6       Sedimentation Rate No results for input(s): ESRSEDRATE in the last 72 hours. C-Reactive Protein No results for input(s): CRP in the last 72 hours.  Micro Results: Recent Results (from the past 720 hour(s))  Culture, blood (routine x 2)     Status: Abnormal   Collection Time: 11/29/20  7:37 PM   Specimen: BLOOD LEFT ARM  Result Value Ref Range Status   Specimen Description BLOOD LEFT ARM  Final   Special Requests   Final    BOTTLES DRAWN AEROBIC ONLY Blood Culture adequate volume   Culture  Setup Time   Final    GRAM POSITIVE COCCI IN CLUSTERS AEROBIC BOTTLE ONLY CRITICAL RESULT CALLED TO, READ BACK BY AND VERIFIED WITH: L,CHEN PHARMD @1951  11/30/20 EB Performed at [facility], [place] [address] [place], [place] [address]    Culture METHICILLIN RESISTANT STAPHYLOCOCCUS AUREUS (A)  Final   Report Status 12/02/2020 FINAL  Final   Organism ID, Bacteria METHICILLIN RESISTANT STAPHYLOCOCCUS AUREUS  Final      Susceptibility   Methicillin resistant staphylococcus aureus - MIC*    CIPROFLOXACIN <=0.5  SENSITIVE Sensitive  ERYTHROMYCIN >=8 RESISTANT Resistant     GENTAMICIN <=0.5 SENSITIVE Sensitive     OXACILLIN >=4 RESISTANT Resistant     TETRACYCLINE 2 SENSITIVE Sensitive     VANCOMYCIN 1 SENSITIVE Sensitive     TRIMETH/SULFA <=10 SENSITIVE Sensitive     CLINDAMYCIN <=0.25 SENSITIVE Sensitive     RIFAMPIN <=0.5 SENSITIVE Sensitive     Inducible Clindamycin NEGATIVE Sensitive     * METHICILLIN RESISTANT STAPHYLOCOCCUS AUREUS  Culture, blood (routine x 2)     Status: Abnormal   Collection Time: 11/29/20  7:37 PM   Specimen: BLOOD LEFT ARM  Result Value Ref Range Status   Specimen Description BLOOD LEFT ARM  Final   Special Requests   Final    BOTTLES DRAWN AEROBIC ONLY Blood Culture adequate volume   Culture  Setup Time   Final    GRAM POSITIVE COCCI IN CLUSTERS AEROBIC BOTTLE ONLY CRITICAL VALUE NOTED.  VALUE IS CONSISTENT WITH PREVIOUSLY REPORTED AND CALLED VALUE.    Culture (A)  Final    STAPHYLOCOCCUS AUREUS SUSCEPTIBILITIES PERFORMED ON PREVIOUS CULTURE WITHIN THE LAST 5 DAYS. Performed at [facility], [place] [address] [place], [place] [address]    Report Status 12/02/2020 FINAL  Final  Blood Culture ID Panel (Reflexed)     Status: Abnormal   Collection Time: 11/29/20  7:37 PM  Result Value Ref Range Status   Enterococcus faecalis NOT DETECTED NOT DETECTED Final   Enterococcus Faecium NOT DETECTED NOT DETECTED Final   Listeria monocytogenes NOT DETECTED NOT DETECTED Final   Staphylococcus species DETECTED (A) NOT DETECTED Final    Comment: CRITICAL RESULT CALLED TO, READ BACK BY AND VERIFIED WITH: L,CHEN PHARMD @1953  11/30/20 EB    Staphylococcus aureus (BCID) DETECTED (A) NOT DETECTED Final    Comment: Methicillin (oxacillin)-resistant Staphylococcus aureus (MRSA). MRSA is predictably resistant to beta-lactam antibiotics (except ceftaroline). Preferred therapy is vancomycin unless clinically contraindicated. Patient requires contact precautions if   hospitalized. CRITICAL RESULT CALLED TO, READ BACK BY AND VERIFIED WITH: L,CHEN PHARMD @1953  11/30/20 EB    Staphylococcus epidermidis NOT DETECTED NOT DETECTED Final   Staphylococcus lugdunensis NOT DETECTED NOT DETECTED Final   Streptococcus species NOT DETECTED NOT DETECTED Final   Streptococcus agalactiae NOT DETECTED NOT DETECTED Final   Streptococcus pneumoniae NOT DETECTED NOT DETECTED Final   Streptococcus pyogenes NOT DETECTED NOT DETECTED Final   A.calcoaceticus-baumannii NOT DETECTED NOT DETECTED Final   Bacteroides fragilis NOT DETECTED NOT DETECTED Final   Enterobacterales NOT DETECTED NOT DETECTED Final   Enterobacter cloacae complex NOT DETECTED NOT DETECTED Final   Escherichia coli NOT DETECTED NOT DETECTED Final   Klebsiella aerogenes NOT DETECTED NOT DETECTED Final   Klebsiella oxytoca NOT DETECTED NOT DETECTED Final   Klebsiella pneumoniae NOT DETECTED NOT DETECTED Final   Proteus species NOT DETECTED NOT DETECTED Final   Salmonella species NOT DETECTED NOT DETECTED Final   Serratia marcescens NOT DETECTED NOT DETECTED Final   Haemophilus influenzae NOT DETECTED NOT DETECTED Final   Neisseria meningitidis NOT DETECTED NOT DETECTED Final   Pseudomonas aeruginosa NOT DETECTED NOT DETECTED Final   Stenotrophomonas maltophilia NOT DETECTED NOT DETECTED Final   Candida albicans NOT DETECTED NOT DETECTED Final   Candida auris NOT DETECTED NOT DETECTED Final   Candida glabrata NOT DETECTED NOT DETECTED Final   Candida krusei NOT DETECTED NOT DETECTED Final   Candida parapsilosis NOT DETECTED NOT DETECTED Final   Candida tropicalis NOT DETECTED NOT DETECTED Final   Cryptococcus neoformans/gattii  NOT DETECTED NOT DETECTED Final   Meth resistant mecA/C and MREJ DETECTED (A) NOT DETECTED Final    Comment: CRITICAL RESULT CALLED TO, READ BACK BY AND VERIFIED WITH: L,CHEN PHARMD @1953  11/30/20 EB Performed at [facility], [name], [place], [place] [address]    Culture, Urine     Status: None   Collection Time: 11/29/20  9:25 PM   Specimen: Urine, Random  Result Value Ref Range Status   Specimen Description URINE, RANDOM  Final   Special Requests NONE  Final   Culture   Final    NO GROWTH Performed at [facility], [place] [address], [place], [place] [address]    Report Status 12/01/2020 FINAL  Final  Culture, blood (routine x 2)     Status: None   Collection Time: 12/01/20 10:16 AM   Specimen: BLOOD LEFT HAND  Result Value Ref Range Status   Specimen Description BLOOD LEFT HAND  Final   Special Requests   Final    BOTTLES DRAWN AEROBIC AND ANAEROBIC Blood Culture results may not be optimal due to an inadequate volume of blood received in culture bottles   Culture   Final    NO GROWTH 5 DAYS Performed at [facility], [place] [address] [place], [place] [address]    Report Status 12/06/2020 FINAL  Final  Culture, blood (routine x 2)     Status: Abnormal   Collection Time: 12/01/20 10:23 AM   Specimen: BLOOD RIGHT HAND  Result Value Ref Range Status   Specimen Description BLOOD RIGHT HAND  Final   Special Requests   Final    BOTTLES DRAWN AEROBIC AND ANAEROBIC Blood Culture adequate volume   Culture  Setup Time   Final    ANAEROBIC BOTTLE ONLY GRAM POSITIVE COCCI CRITICAL VALUE NOTED.  VALUE IS CONSISTENT WITH PREVIOUSLY REPORTED AND CALLED VALUE.    Culture (A)  Final    STAPHYLOCOCCUS AUREUS SUSCEPTIBILITIES PERFORMED ON PREVIOUS CULTURE WITHIN THE LAST 5 DAYS. Performed at [facility], [place] [name] [place], [place] [address]    Report Status 12/05/2020 FINAL  Final  Surgical pcr screen     Status: Abnormal   Collection Time: 12/01/20 10:15 PM   Specimen: Nasal Mucosa; Nasal Swab  Result Value Ref Range Status   MRSA, PCR POSITIVE (A) NEGATIVE Final    Comment: RESULT CALLED TO, READ BACK BY AND VERIFIED WITH: ROGERS,P RN 0001 12/02/2020 MITCHELL,L    Staphylococcus aureus POSITIVE (A) NEGATIVE Final     Comment: (NOTE) The Xpert SA Assay (FDA approved for NASAL specimens in patients 2 years of age and older), is one component of a comprehensive surveillance program. It is not intended to diagnose infection nor to guide or monitor treatment. Performed at [facility], [place] [address], [place], [place] [address]   Resp Panel by RT-PCR (Flu A&B, Covid) Nasopharyngeal Swab     Status: None   Collection Time: 12/02/20  8:15 AM   Specimen: Nasopharyngeal Swab; Nasopharyngeal(NP) swabs in vial transport medium  Result Value Ref Range Status   SARS Coronavirus 2 by RT PCR NEGATIVE NEGATIVE Final    Comment: (NOTE) SARS-CoV-2 target nucleic acids are NOT DETECTED.  The SARS-CoV-2 RNA is generally detectable in upper respiratory specimens during the acute phase of infection. The lowest concentration of SARS-CoV-2 viral copies this assay can detect is 138 copies/mL. A negative result does not preclude SARS-Cov-2 infection and should not be used  as the sole basis for treatment or other patient management decisions. A negative result may occur with  improper specimen collection/handling, submission of specimen other than nasopharyngeal swab, presence of viral mutation(s) within the areas targeted by this assay, and inadequate number of viral copies(<138 copies/mL). A negative result must be combined with clinical observations, patient history, and epidemiological information. The expected result is Negative.  Fact Sheet for Patients:  EntrepreneurPulse.com.au  Fact Sheet for Healthcare Providers:  IncredibleEmployment.be  This test is no t yet approved or cleared by the [place] FDA and  has been authorized for detection and/or diagnosis of SARS-CoV-2 by FDA under an Emergency Use Authorization (EUA). This EUA will remain  in effect (meaning this test can be used) for the duration of the COVID-19 declaration under Section 564(b)(1) of the Act,  21 U.S.C.section 360bbb-3(b)(1), unless the authorization is terminated  or revoked sooner.       Influenza A by PCR NEGATIVE NEGATIVE Final   Influenza B by PCR NEGATIVE NEGATIVE Final    Comment: (NOTE) The Xpert Xpress SARS-CoV-2/FLU/RSV plus assay is intended as an aid in the diagnosis of influenza from Nasopharyngeal swab specimens and should not be used as a sole basis for treatment. Nasal washings and aspirates are unacceptable for Xpert Xpress SARS-CoV-2/FLU/RSV testing.  Fact Sheet for Patients: EntrepreneurPulse.com.au  Fact Sheet for Healthcare Providers: IncredibleEmployment.be  This test is not yet approved or cleared by the [place] FDA and has been authorized for detection and/or diagnosis of SARS-CoV-2 by FDA under an Emergency Use Authorization (EUA). This EUA will remain in effect (meaning this test can be used) for the duration of the COVID-19 declaration under Section 564(b)(1) of the Act, 21 U.S.C. section 360bbb-3(b)(1), unless the authorization is terminated or revoked.  Performed at [facility], [place] [address] [place], [place] [address]   Aerobic/Anaerobic Culture w Gram Stain (surgical/deep wound)     Status: None (Preliminary result)   Collection Time: 12/02/20  4:34 PM   Specimen: PATH Digit amputation; Tissue  Result Value Ref Range Status   Specimen Description TISSUE LEFT TOE  Final   Special Requests PATIENT ON FOLLOWING CEFEPIME MAXIPIME VANC  Final   Gram Stain   Final    RARE WBC PRESENT, PREDOMINANTLY PMN NO ORGANISMS SEEN    Culture   Final    NO GROWTH 4 DAYS NO ANAEROBES ISOLATED; CULTURE IN PROGRESS FOR 5 DAYS Performed at [facility], [place] [address] [place], [place] [address]    Report Status PENDING  Incomplete  Culture, blood (routine x 2)     Status: None (Preliminary result)   Collection Time: 12/04/20 12:14 PM   Specimen: BLOOD  Result Value Ref Range Status    Specimen Description BLOOD LEFT ANTECUBITAL  Final   Special Requests   Final    BOTTLES DRAWN AEROBIC AND ANAEROBIC Blood Culture adequate volume   Culture   Final    NO GROWTH 2 DAYS Performed at [facility], [place] [address], [place],  [address]    Report Status PENDING  Incomplete  Culture, blood (routine x 2)     Status: None (Preliminary result)   Collection Time: 12/04/20 12:14 PM   Specimen: BLOOD LEFT FOREARM  Result Value Ref Range Status   Specimen Description BLOOD LEFT FOREARM  Final   Special Requests   Final    BOTTLES DRAWN AEROBIC AND ANAEROBIC Blood Culture adequate volume   Culture   Final  NO GROWTH 2 DAYS Performed at [facility], [place] [address] [place], [place] [address]    Report Status PENDING  Incomplete    Studies/Results: ECHO TEE  Result Date: 12/04/2020    TRANSESOPHOGEAL ECHO REPORT   Patient Name:   AIKEEM LILLEY Date of Exam: 12/04/2020 Medical Rec #:  [id number]      Height:       75.0 in Accession #:    [id number]     Weight:       254.2 lb Date of Birth:  04-Dec-1970       BSA:          2.430 m Patient Age:    13 years       BP:           122/71 mmHg Patient Gender: M              HR:           78 bpm. Exam Location:  Inpatient Procedure: Transesophageal Echo, Cardiac Doppler and Color Doppler Indications:     Bacteremia  History:         Patient has prior history of Echocardiogram examinations, most                  recent 12/01/2020. Risk Factors:Diabetes. MRSA.  Sonographer:     Dustin Flock Referring Phys:  [phone] Karoline Caldwell Diagnosing Phys: [name] MD PROCEDURE: The transesophogeal probe was passed without difficulty through the esophogus of the patient. Sedation performed by performing physician. The patient was monitored while under deep sedation. Anesthestetic sedation was provided intravenously by  Anesthesiology: 244.84mg  of Propofol, 100mg  of Lidocaine. The patient developed no complications during the procedure.  IMPRESSIONS  1. Left ventricular ejection fraction, by estimation, is 55 to 60%. The left ventricle has normal function. The left ventricle has no regional wall motion abnormalities.  2. Right ventricular systolic function is normal. The right ventricular size is normal.  3. No left atrial/left atrial appendage thrombus was detected.  4. The mitral valve is normal in structure. Trivial mitral valve regurgitation.  5. The aortic valve is normal in structure. Aortic valve regurgitation is not visualized. FINDINGS  Left Ventricle: Left ventricular ejection fraction, by estimation, is 55 to 60%. The left ventricle has normal function. The left ventricle has no regional wall motion abnormalities. The left ventricular internal cavity size was normal in size. Right Ventricle: The right ventricular size is normal. No increase in right ventricular wall thickness. Right ventricular systolic function is normal. Left Atrium: Left atrial size was normal in size. No left atrial/left atrial appendage thrombus was detected. Right Atrium: Right atrial size was normal in size. Pericardium: There is no evidence of pericardial effusion. Mitral Valve: The mitral valve is normal in structure. Trivial mitral valve regurgitation. There is no evidence of mitral valve vegetation. Tricuspid Valve: The tricuspid valve is grossly normal. Tricuspid valve regurgitation is mild. Aortic Valve: The aortic valve is normal in structure. Aortic valve regurgitation is not visualized. There is no evidence of aortic valve vegetation. Pulmonic Valve: The pulmonic valve was normal in structure. Pulmonic valve regurgitation is trivial. Aorta: The aortic root and ascending aorta are structurally normal, with no evidence of dilitation. IAS/Shunts: No atrial level shunt detected by color flow Doppler. [name] MD Electronically signed by [name] MD Signature Date/Time: 12/04/2020/3:22:28 PM    Final       Assessment/Plan:  INTERVAL HISTORY:  Patient is undergone ERCP  Principal Problem:   Osteomyelitis ([place]) Active Problems:   Uncontrolled diabetes mellitus with hyperglycemia, with long-term current use of insulin (HCC)   Asthma, intermittent   Diabetic foot infection (HCC)   MRSA bacteremia   Right hip pain   Infective myositis of right lower extremity   Acute osteomyelitis of right pelvic region and thigh (HCC)   Discitis of thoracolumbar region   Pericardial effusion   Thrush   Bacteremia   Abdominal pain   Choledocholithiasis    NILS THOR is a 50 y.o. male with with diabetes mellitus I saw in 2021 when he had a nonhealing wound and underwent a right transmetatarsal amputation which we treated with vancomycin cefepime and Flagyl in the past.  He was admitted with back pain and found on MRI to have T9-T10 discitis and osteomyelitis.  He also has clear necrotic infection involving his second toe.  Blood cultures have been positive for methicillin-resistant Staphylococcus aureus.  MRI of the foot shows evidence of osteomyelitis in the distal phalanx of the second toe as well as edema enhancement in the proximal middle phalange of the second toe and plantar surface of the head of the second metatarsal  There was also edema and enhancement in the head and neck of the first metatarsal and medial and lateral sesamoids that were also working for osteomyelitis.  Vascular surgery were aware of MRI findings and Dr. Oneida Alar felt that there is no indication to intervene upon this site seen on the MRI at site of prior amputation but did amputate the second toe along with the metatarsal head.  The MRI of the hip is concerning.  I do not see something that orthopedic surgery would intervene upon at this time but this area should be carefully monitored and if he develops worsening pain he should have a repeat MRI to look for pyomyositis.  Given the worsening pain I would plan on repeating the MRI on Monday or Tuesday of this next  week.    Candidal esophagitis: This was seen on EGD we will continue fluconazole    [name] has an appointment on 12/20/2020 at [facility] is located in the [facility] at  [address] in [place].  [address], which is located to the left of the elevators.  Phone: [phone]  Fax: [phone]  [email or web address]   He will need to arrive 15 minutes piror to his appointment.  Dr. Baxter Flattery is available for questions this weekend and. Dr. Linus Salmons and Dr. West Bali will take over on Monday.    LOS: 7 days   Alcide Evener 12/06/2020, 12:42 PM

## 2020-12-06 NOTE — Progress Notes (Signed)
Pharmacy Antibiotic Note  Christopher Fernandez is a 50 y.o. male admitted on 11/29/2020 with MRSA bacteremia and concern for osteomyelitis.  Pharmacy has been consulted for Daptomycin dosing.  Today is ay #7 of antibiotic therapy.  Baseline CK 42 (wnl).    Plan: Continue Daptomycin 1000 mg IV every 24 hours Weekly CK on Tuesdays OPAT completed with anticipated stop date 5/18  Height: 6\' 3"  (190.5 cm) Weight: 115.3 kg (254 lb 3.1 oz) IBW/kg (Calculated) : 84.5  Temp (24hrs), Avg:98.6 F (37 C), Min:97.9 F (36.6 C), Max:99.2 F (37.3 C)  Recent Labs  Lab 12/01/20 0112 12/02/20 0302 12/03/20 0746 12/03/20 0833 12/03/20 1643 12/04/20 0907 12/05/20 0500 12/06/20 0154  WBC 10.6* 9.3  --  8.2  --  7.3 7.3  --   CREATININE 0.93 0.99  --  0.83  --  0.78 0.79 0.78  VANCOTROUGH  --   --   --   --  10*  --   --   --   VANCOPEAK  --   --  27*  --   --   --   --   --     Estimated Creatinine Clearance: 149.6 mL/min (by C-G formula based on SCr of 0.78 mg/dL).    Allergies  Allergen Reactions  . Eggs Or Egg-Derived Products Rash  . Morphine And Related Other (See Comments)    Cant take because of pancreatitis  . Cocoa Rash    3/19 Vanc >>3/22 3/19 Zosyn >> 3/20 3/20 Cefepime >> 3/21 3/20 Metronidazole >> 3/21 3/18 PTA Fluconazole 100 >> 3/20 3/20 Fluconazole 200 >> (4/7) 3/22 Daptomycin >> (5/18)   3/18BCx: MRSA PCR 3/18UCx: neg,F 12/23MRSA PCR: positive 3/20 Bcx: Ng x 2d 3/21: Aerobic/Anaerobic cx: ngtd 3/23: BCx x 2 : ngtd  Horton Chin, Pharm.D., BCPS Clinical Pharmacist  **Pharmacist phone directory can be found on [email or web address] listed under [place].  12/06/2020 1:20 PM

## 2020-12-06 NOTE — Op Note (Signed)
[facility] Patient Name: Christopher Fernandez Procedure Date : 12/06/2020 MRN: [id number] Attending MD: Justice Britain , MD Date of Birth: October 21, 1970 CSN: [id number] Age: 50 Admit Type: Inpatient Procedure:                ERCP Indications:              Bile duct stone(s), Abnormal MRCP, Biliary dilation                            on Ultrasound, Elevated liver enzymes Providers:                Justice Britain, MD, Baird Cancer, RN, Tyna Jaksch Technician Referring MD:             Estill Cotta. Loletha Carrow, MD, [facility]                            Medicine Service Medicines:                General Anesthesia, Indomethacin 100 mg PR, Cipro                            400 mg IV, Glucagon 1 mg IV Complications:            No immediate complications. Estimated Blood Loss:     Estimated blood loss was minimal. Procedure:                Pre-Anesthesia Assessment:                           - Prior to the procedure, a History and Physical                            was performed, and patient medications and                            allergies were reviewed. The patient's tolerance of                            previous anesthesia was also reviewed. The risks                            and benefits of the procedure and the sedation                            options and risks were discussed with the patient.                            All questions were answered, and informed consent                            was obtained. Prior Anticoagulants: The patient has  taken heparin, last dose was 1 day prior to                            procedure. ASA Grade Assessment: II - A patient                            with mild systemic disease. After reviewing the                            risks and benefits, the patient was deemed in                            satisfactory condition to undergo the procedure.                            After obtaining informed consent, the scope was                            passed under direct vision. Throughout the                            procedure, the patient's blood pressure, pulse, and                            oxygen saturations were monitored continuously. The                            [employer] D single use                            duodenoscope was introduced through the mouth, and                            used to inject contrast into and used to inject                            contrast into the bile duct and ventral pancreatic                            duct. The ERCP was technically difficult and                            complex due to challenging cannulation. Successful                            completion of the procedure was aided by performing                            the maneuvers documented (below) in this report.                            The patient tolerated the procedure. Scope In: Scope Out: Findings:      The scout film  was normal.      The upper GI tract was traversed under direct vision without detailed       examination. Patchy moderate inflammation characterized by erythema and       granularity was found in the gastric body and in the gastric antrum -       this was biopsied. No gross lesions were noted in the duodenal bulb, in       the first portion of the duodenum and in the second portion of the       duodenum. The major papilla was edematous.      The bile duct could not be cannulated with the Hydratome sphincterotome.       Repeated attempts at biliary cannulation were not successful while using       a wire-guided approach. This led to placement of the wire within the       pancreatic duct. Decision was made to pursue a double-wire approach. The       wire was left within the pancreatic duct. The bile duct could not be       cannulated even with double-wire technique in long and short positions.       A 4  mm ventral pancreatic sphincterotomy was made with a monofilament       Hydratome sphincterotome using ERBE electrocautery in effort of pursuing       a Goff technique to biliary cannulation. There was no       post-sphincterotomy bleeding. Since the patient has known chronic       pancreatitis, and we had already made a sphincterotomy, to find objects       the ventral pancreatic duct was swept with a retrieval balloon starting       at the the pancreatic duct in the genu of the pancreas. A few stone       fragments were removed. Continuation and further injection of contrast       into the pancreas was not performed as that had not been the intention       or need of the patient, though the pancreatic duct was noted to be       dilated. I wanted to place a prophylactic pancreatic duct stent, but on       last pull through, I lost the wire in PD.      I then replaced the 0.035 inch x 260 cm straight Hydra Jagwire in the       sphincterotome and with further bowing, I was able to pass the wire into       the biliary tree. The Hydratome sphincterotome was passed over the       guidewire and the bile duct was then deeply cannulated. Contrast was       injected. I personally interpreted the bile duct images. Ductal flow of       contrast was adequate. Image quality was adequate. Contrast extended to       the bifurcation. Opacification of the entire biliary tree except for the       cystic duct and gallbladder was successful. The main bile duct was       severely dilated. The largest diameter was 20 mm. The lower third of the       main bile duct contained filling defects thought to be sludge vs stone.       An 8 mm biliary sphincterotomy was made with a monofilament Hydratome  sphincterotome using ERBE electrocautery. There was no       post-sphincterotomy bleeding. Due to the angulation, I wanted to improve       biliary drainage so a sphincteroplasty/dilation of the distal common        bile duct with an 04-22-09 mm balloon (to a maximum balloon size of 10 mm)       dilator was successful for a total of 4 minutes. To discover objects,       the biliary tree was swept with a retrieval balloon starting at the       bifurcation. Sludge was swept from the duct.      A 0.035 inch x 260 cm straight Hydra Jagwire was passed into the ventral       pancreatic duct. One 4 Fr by 7 cm temporary plastic pancreatic stent       with a single external pigtail was placed into the ventral pancreatic       duct to decrease PEP. The stent was in good position.      The duodenoscope was withdrawn from the patient. Impression:               - Gastritis - biopsied.                           - No gross lesions in the duodenal bulb, in the                            first portion of the duodenum and in the second                            portion of the duodenum.                           - The major papilla appeared edematous.                           - Initial attempts at biliary cannulation led to PD                            wire placement. Double wire technique attempted but                            unsuccessful. Proceeded with Mikey Bussing technique and PD                            sphincterotomy in direction of biliary tree to try                            and open the biliary orifice further. This                            eventually led to biliary cannulation.                           - After pancreatic sphincterotomy, quick sweep of  the pancreatic duct led to a few pancreatic stone                            fragments being removed. Intention was not for PD                            work today, so this was consider only partial                            removal.                           - The biliary fluoroscopic examination was                            suspicious for sludge.                           - The entire main bile duct was severely dilated.                            - A biliary sphincterotomy was performed. Biliary                            sphincteroplasty performed.                           - The biliary tree was swept and sludge was found.                           - One temporary plastic pancreatic stent was placed                            into the ventral pancreatic duct to decrease risk                            of PEP pancreatitis. Recommendation:           - The patient will be observed post-procedure,                            until all discharge criteria are met.                           - Return patient to hospital ward for ongoing care.                           - Observe patient's clinical course.                           - Full liquid diet.                           - Check liver enzymes (AST, ALT, alkaline  phosphatase, bilirubin) in the morning.                           - Watch for pancreatitis, bleeding, perforation,                            and cholangitis.                           - Patient will need a KUB 2-view in 10-14 days to                            ensure pancreatic stent has migrated successfully.                            If still present at that time will need to be                            scheduled for EGD with stent pull.                           - As large as biliary tree is, he will be at risk                            of biliary sludge ball development, so hopefully                            the biliary tree decompresses post procedure in the                            coming weeks.                           - The findings and recommendations were discussed                            with the patient.                           - The findings and recommendations were discussed                            with the patient's family.                           - The findings and recommendations were discussed                            with the referring  physician. Procedure Code(s):        --- Professional ---                           [phone], Endoscopic retrograde  cholangiopancreatography (ERCP); with placement of                            endoscopic stent into biliary or pancreatic duct,                            including pre- and post-dilation and guide wire                            passage, when performed, including sphincterotomy,                            when performed, each stent                           [id number], Endoscopic retrograde                            cholangiopancreatography (ERCP); with removal of                            calculi/debris from biliary/pancreatic duct(s) Diagnosis Code(s):        --- Professional ---                           K29.70, Gastritis, unspecified, without bleeding                           K83.8, Other specified diseases of biliary tract                           K86.89, Other specified diseases of pancreas                           K80.50, Calculus of bile duct without cholangitis                            or cholecystitis without obstruction                           R74.8, Abnormal levels of other serum enzymes                           R93.2, Abnormal findings on diagnostic imaging of                            liver and biliary tract CPT copyright 2019 [employer]. All rights reserved. The codes documented in this report are preliminary and upon coder review may  be revised to meet current compliance requirements. Justice Britain, MD 12/06/2020 4:13:11 PM Number of Addenda: 0

## 2020-12-06 NOTE — Anesthesia Preprocedure Evaluation (Addendum)
Anesthesia Evaluation  Patient identified by MRN, date of birth, ID band Patient awake    Reviewed: Allergy & Precautions, NPO status , Patient's Chart, lab work & pertinent test results  Airway Mallampati: II  TM Distance: >3 FB Neck ROM: Full    Dental  (+) Chipped,    Pulmonary asthma , COPD,  COPD inhaler, Current SmokerPatient did not abstain from smoking.,    Pulmonary exam normal breath sounds clear to auscultation       Cardiovascular Normal cardiovascular exam Rhythm:Regular Rate:Normal  ECHO: 1. Left ventricular ejection fraction, by estimation, is 55 to 60%. The left ventricle has normal function. The left ventricle has no regional wall motion abnormalities. 2. Right ventricular systolic function is normal. The right ventricular size is normal. 3. No left atrial/left atrial appendage thrombus was detected. 4. The mitral valve is normal in structure. Trivial mitral valve regurgitation. 5. The aortic valve is normal in structure. Aortic valve regurgitation is not visualized.   Neuro/Psych PSYCHIATRIC DISORDERS Anxiety Depression  Neuromuscular disease    GI/Hepatic hiatal hernia, GERD  Medicated and Controlled,(+)     substance abuse  alcohol use,   Endo/Other  diabetes, Insulin Dependent, Oral Hypoglycemic Agents  Renal/GU negative Renal ROS     Musculoskeletal  (+) Arthritis ,   Abdominal (+) + obese,   Peds  Hematology  (+) anemia , HLD   Anesthesia Other Findings Choledocholithiasis Elevated LFTs  Reproductive/Obstetrics                            Anesthesia Physical Anesthesia Plan  ASA: III  Anesthesia Plan: General   Post-op Pain Management:    Induction: Intravenous  PONV Risk Score and Plan: 1 and Ondansetron, Dexamethasone, Midazolam and Treatment may vary due to age or medical condition  Airway Management Planned: Oral ETT  Additional Equipment:   Intra-op  Plan:   Post-operative Plan: Extubation in OR  Informed Consent: I have reviewed the patients History and Physical, chart, labs and discussed the procedure including the risks, benefits and alternatives for the proposed anesthesia with the patient or authorized representative who has indicated his/her understanding and acceptance.     Dental advisory given  Plan Discussed with: CRNA  Anesthesia Plan Comments: (Heart rate in holding area 59)       Anesthesia Quick Evaluation

## 2020-12-06 NOTE — Progress Notes (Signed)
Family Medicine Teaching Service Daily Progress Note Intern Pager: [phone]  Patient name: Christopher Fernandez Medical record number: [id number] Date of birth: 10-Mar-1971 Age: 50 y.o. Gender: male  Primary Care Provider: Danna Hefty, DO Consultants: Ortho, ID, GI Code Status: Full  Pt Overview and Major Events to Date:  3/18: admitted for c/f thoracic discitis vs osteomyelitis 3/19: MR confirmed T9-T10 osteomyelitis, also left foot 2nd digit osteomyelitis with abscess  Assessment and Plan: Christopher Fernandez a 50 y.o.malepresenting with back pain. PMH is significant forcellulitis, diabetes, neuropathy, anemia, hyperlipidemia, asthma, alcohol abuse, tobacco use disorder, trauma, cholecystectomy, asplenia, anxietyanddepression.  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 Anticipate antibiotics for at least 6 weeks total. Repeat blood culture with NGTD x2days. Hip pain still not well controlled with tramadol, will discontinue tramadol and add Ibuprofen for pain relief. -ID following, appreciate recommendations - Vascular surgery following, appreciate recs -Continue daptomycin (3/22-) - Repeat blood cultures NGTD at 2 days - Pain control: oxycodone71m IR POQ4PRN, tylenol 6543m6PRN, Ibuprofen 40087m6h PRN, gabapentin 600 mg TID, lidocaine patchQ24H - Protonix 4m84mily for GI protection - PT/OT - Continue wound care - Incentive spirometry/Flutter valve  RUQ pain  LFTs elevated but improving (ALK [id number] 80>31>46, ALT 150>86>79). Plan for ERCP today. Pain management as above, pain remains unchanged today. - GI consulted, appreciate recs - ERCP today, will follow-up results - Continue to monitor pain  T2DM with neuropathy: Chronic, stable [id number] received 13U Novolog, Lantus 5U. Patient is currently on CLD anticipating EGD, will continue to monitor his sugars,  patient will likely need to increase Lantus to 10U when eating.  - Lantus 5U daily, monitor CBGs closely and adjust dose as needed - CBG before meals and nightly -sSSI -Continue home gabapentin  Esophageal candidiasis Found by EGD on 3/09. - Cont.fluconazole 200mg18mly until 4/6 - Hold atorvastatin  - Monitor sx  Thrombocytosis CBC not obtained today, will check tomorrow - Continue to monitor with CBC - Continue Lovenox for DVT PPx  Pancreatic insufficiencychronic pancreatitis - Continue Creon 36, 000 units with meals . Hx of Anemia: stable Hgb 10.4 on 3/24, will recheck on 3/26. Transfusion threshold of 7. - Monitor with CBC  Hyperlipidemia: Chronic, stable -Continue home atorvastatin daily  Hx asthma: Chronic, stable - Dulera, per formulary - Albuterol PRN  Hx alcohol abuse  Tobacco use disorder -Encourage smoking cessation  Hx Tremor: chronic, stable - Continue propranolol 80 mg daily  Hx Asplenia, Cholecystectomy: chronic, stable -Consider Meningococal vaccine as outpatient  Anxiety with Depression: Chronic, stable - Continue home aripirazole, cymbalta, and mirtazapine  Weight loss Recent 12 lb weight loss and night sweats. Current weight 115.3kg (was 112.9kg upon admission) -Continue to monitor   FEN/GI:CLD  PPx:LMBW:GYKZLDJtus is: Inpatient  Remains inpatient appropriate because:IV treatments appropriate due to intensity of illness or inability to take PO   Dispo: The patient is from: Home              Anticipated d/c is to: Home              Patient currently is not medically stable to d/c.   Difficult to place patient No    Subjective:  Patient reports that his pain is doing a bit better because he was given some Motrin overnight and he states that NSAIDs are the main thing that works for pain control for him.  He  reports that he feels his right hip is getting a little bit weaker as he was doing exercises in the bed and  there was more weakness than yesterday, though he states he is still able to ambulate well.  Objective: Temp:  [97.9 F (36.6 C)-99.2 F (37.3 C)] 97.9 F (36.6 C) (03/25 0801) Pulse Rate:  [56-78] 60 (03/25 0805) Resp:  [17-18] 18 (03/25 0805) BP: (130-153)/(79-94) 153/94 (03/25 0801) SpO2:  [92 %-97 %] 92 % (03/25 0805) Physical Exam: General: NAD, sitting up in bed, well-appearing Cardiov3ascular: RRR, no murmur appreciated Respiratory: comfortable on room air, no increased WOB Abdomen: soft, tender to palpation in RUQ, non-distended, bowel sounds present Extremities: No peripheral edema noted MSK: pain with internal rotation of the hip and extreme flexion, 5/5 strength on BLE,   Laboratory: Recent Labs  Lab 12/03/20 0833 12/04/20 0907 12/05/20 0500  WBC 8.2 7.3 7.3  HGB 12.0* 10.6* 10.4*  HCT 36.6* 32.7* 31.3*  PLT 459* 546* 509*   Recent Labs  Lab 12/04/20 0907 12/05/20 0500 12/06/20 0154  NA 135 131* 134*  K 4.3 4.1 4.2  CL 102 98 100  CO2 '28 25 25  ' BUN '11 11 11  ' CREATININE 0.78 0.79 0.78  CALCIUM 8.8* 8.4* 8.7*  PROT 7.0 6.3* 6.5  BILITOT 0.6 0.3 0.6  ALKPHOS 212* 166* 149*  ALT 150* 86* 79*  AST 80* 31 46*  GLUCOSE 174* 295* 196*     Imaging/Diagnostic Tests: No results found.   Christopher Patience, DO 12/06/2020, 8:53 AM PGY-1, [place] Intern pager: [phone], text pages welcome

## 2020-12-06 NOTE — Transfer of Care (Signed)
Immediate Anesthesia Transfer of Care Note  Patient: Christopher Fernandez  Procedure(s) Performed: ENDOSCOPIC RETROGRADE CHOLANGIOPANCREATOGRAPHY (ERCP) WITH PROPOFOL (N/A ) BIOPSY SPHINCTEROTOMY PANCREATIC STENT PLACEMENT BILIARY DILATION REMOVAL OF STONES  Patient Location: Endoscopy Unit  Anesthesia Type:General  Level of Consciousness: awake, alert  and oriented  Airway & Oxygen Therapy: Patient Spontanous Breathing  Post-op Assessment: Report given to RN and Post -op Vital signs reviewed and stable  Post vital signs: Reviewed and stable  Last Vitals:  Vitals Value Taken Time  BP 156/99 12/06/20 1456  Temp    Pulse 66 12/06/20 1457  Resp 14 12/06/20 1457  SpO2 99 % 12/06/20 1457  Vitals shown include unvalidated device data.  Last Pain:  Vitals:   12/06/20 1233  TempSrc: Temporal  PainSc: 6       Patients Stated Pain Goal: 3 (40/33/53 3174)  Complications: No complications documented.

## 2020-12-06 NOTE — Progress Notes (Signed)
PT Cancellation Note  Patient Details Name: OSAZE HUBBERT MRN: [id number] DOB: 1971/09/05   Cancelled Treatment:    Reason Eval/Treat Not Completed: (P) Patient at procedure or test/unavailable (Pt off unit for surgical procedure will f/u per POC.)   Ellard Nan Eli Hose 12/06/2020, 1:01 PM  Erasmo Leventhal , PTA Acute Rehabilitation Services Pager [phone] Office [phone]

## 2020-12-07 ENCOUNTER — Inpatient Hospital Stay: Payer: Self-pay

## 2020-12-07 ENCOUNTER — Encounter (HOSPITAL_COMMUNITY): Payer: Self-pay | Admitting: Cardiovascular Disease

## 2020-12-07 DIAGNOSIS — R7881 Bacteremia: Secondary | ICD-10-CM | POA: Diagnosis not present

## 2020-12-07 DIAGNOSIS — K838 Other specified diseases of biliary tract: Secondary | ICD-10-CM

## 2020-12-07 DIAGNOSIS — B9562 Methicillin resistant Staphylococcus aureus infection as the cause of diseases classified elsewhere: Secondary | ICD-10-CM | POA: Diagnosis not present

## 2020-12-07 LAB — COMPREHENSIVE METABOLIC PANEL
ALT: 65 U/L — ABNORMAL HIGH (ref 0–44)
AST: 28 U/L (ref 15–41)
Albumin: 2.4 g/dL — ABNORMAL LOW (ref 3.5–5.0)
Alkaline Phosphatase: 153 U/L — ABNORMAL HIGH (ref 38–126)
Anion gap: 9 (ref 5–15)
BUN: 16 mg/dL (ref 6–20)
CO2: 25 mmol/L (ref 22–32)
Calcium: 8.4 mg/dL — ABNORMAL LOW (ref 8.9–10.3)
Chloride: 97 mmol/L — ABNORMAL LOW (ref 98–111)
Creatinine, Ser: 0.89 mg/dL (ref 0.61–1.24)
GFR, Estimated: >60 mL/min (ref >60)
Glucose, Bld: 418 mg/dL — ABNORMAL HIGH (ref 70–99)
Potassium: 5.6 mmol/L — ABNORMAL HIGH (ref 3.5–5.1)
Sodium: 131 mmol/L — ABNORMAL LOW (ref 135–145)
Total Bilirubin: 0.6 mg/dL (ref 0.3–1.2)
Total Protein: 6.7 g/dL (ref 6.5–8.1)

## 2020-12-07 LAB — BASIC METABOLIC PANEL
Anion gap: 9 (ref 5–15)
BUN: 12 mg/dL (ref 6–20)
CO2: 26 mmol/L (ref 22–32)
Calcium: 8.4 mg/dL — ABNORMAL LOW (ref 8.9–10.3)
Chloride: 97 mmol/L — ABNORMAL LOW (ref 98–111)
Creatinine, Ser: 0.93 mg/dL (ref 0.61–1.24)
GFR, Estimated: >60 mL/min (ref >60)
Glucose, Bld: 449 mg/dL — ABNORMAL HIGH (ref 70–99)
Potassium: 4.7 mmol/L (ref 3.5–5.1)
Sodium: 132 mmol/L — ABNORMAL LOW (ref 135–145)

## 2020-12-07 LAB — AEROBIC/ANAEROBIC CULTURE W GRAM STAIN (SURGICAL/DEEP WOUND): Culture: NO GROWTH

## 2020-12-07 LAB — CBC
HCT: 32.6 % — ABNORMAL LOW (ref 39.0–52.0)
Hemoglobin: 10.8 g/dL — ABNORMAL LOW (ref 13.0–17.0)
MCH: 28.5 pg (ref 26.0–34.0)
MCHC: 33.1 g/dL (ref 30.0–36.0)
MCV: 86 fL (ref 80.0–100.0)
Platelets: 598 10*3/uL — ABNORMAL HIGH (ref 150–400)
RBC: 3.79 MIL/uL — ABNORMAL LOW (ref 4.22–5.81)
RDW: 13.6 % (ref 11.5–15.5)
WBC: 9.5 10*3/uL (ref 4.0–10.5)
nRBC: 0 % (ref 0.0–0.2)

## 2020-12-07 LAB — GLUCOSE, CAPILLARY
Glucose-Capillary: 430 mg/dL — ABNORMAL HIGH (ref 70–99)
Glucose-Capillary: 371 mg/dL — ABNORMAL HIGH (ref 70–99)
Glucose-Capillary: 407 mg/dL — ABNORMAL HIGH (ref 70–99)
Glucose-Capillary: 327 mg/dL — ABNORMAL HIGH (ref 70–99)
Glucose-Capillary: 137 mg/dL — ABNORMAL HIGH (ref 70–99)
Glucose-Capillary: 174 mg/dL — ABNORMAL HIGH (ref 70–99)

## 2020-12-07 MED ORDER — ENOXAPARIN SODIUM 60 MG/0.6ML ~~LOC~~ SOLN
55.0000 mg | SUBCUTANEOUS | Status: DC
  Administered 2020-12-07 – 2020-12-10 (×4): 55 mg via SUBCUTANEOUS
  Filled 2020-12-07 (×4): qty 0.6

## 2020-12-07 MED ORDER — IBUPROFEN 200 MG PO TABS: 400.0000 mg | ORAL_TABLET | Freq: Two times a day (BID) | ORAL | Status: DC | PRN

## 2020-12-07 MED ORDER — POLYETHYLENE GLYCOL 3350 17 G PO PACK
17.0000 g | PACK | Freq: Every day | ORAL | Status: DC | PRN
  Filled 2020-12-07: qty 1

## 2020-12-07 MED ORDER — INSULIN ASPART 100 UNIT/ML ~~LOC~~ SOLN
6.0000 [IU] | Freq: Once | SUBCUTANEOUS | Status: AC
  Administered 2020-12-07: 6 [IU] via SUBCUTANEOUS

## 2020-12-07 MED ORDER — INSULIN GLARGINE 100 UNIT/ML ~~LOC~~ SOLN
18.0000 [IU] | Freq: Every day | SUBCUTANEOUS | Status: DC
  Administered 2020-12-08 – 2020-12-10 (×3): 18 [IU] via SUBCUTANEOUS
  Filled 2020-12-07 (×3): qty 0.18

## 2020-12-07 MED ORDER — ENOXAPARIN SODIUM 40 MG/0.4ML ~~LOC~~ SOLN: 40.0000 mg | SUBCUTANEOUS | Status: DC

## 2020-12-07 MED ORDER — TRAMADOL HCL 50 MG PO TABS
50.0000 mg | ORAL_TABLET | Freq: Three times a day (TID) | ORAL | Status: DC | PRN
  Administered 2020-12-07 – 2020-12-08 (×3): 50 mg via ORAL
  Filled 2020-12-07 (×3): qty 1

## 2020-12-07 MED ORDER — INSULIN ASPART 100 UNIT/ML ~~LOC~~ SOLN
0.0000 [IU] | Freq: Three times a day (TID) | SUBCUTANEOUS | Status: DC
  Administered 2020-12-07: 2 [IU] via SUBCUTANEOUS
  Administered 2020-12-07: 12 [IU] via SUBCUTANEOUS
  Administered 2020-12-07: 15 [IU] via SUBCUTANEOUS
  Administered 2020-12-08: 5 [IU] via SUBCUTANEOUS
  Administered 2020-12-08: 6 [IU] via SUBCUTANEOUS
  Administered 2020-12-08: 5 [IU] via SUBCUTANEOUS
  Administered 2020-12-09: 2 [IU] via SUBCUTANEOUS
  Administered 2020-12-09: 3 [IU] via SUBCUTANEOUS
  Administered 2020-12-09 – 2020-12-10 (×2): 2 [IU] via SUBCUTANEOUS
  Administered 2020-12-10 (×2): 3 [IU] via SUBCUTANEOUS

## 2020-12-07 MED ORDER — INSULIN GLARGINE 100 UNIT/ML ~~LOC~~ SOLN
10.0000 [IU] | Freq: Every day | SUBCUTANEOUS | Status: DC
  Administered 2020-12-07: 10 [IU] via SUBCUTANEOUS
  Filled 2020-12-07: qty 0.1

## 2020-12-07 MED ORDER — INSULIN GLARGINE 100 UNIT/ML ~~LOC~~ SOLN
10.0000 [IU] | Freq: Every day | SUBCUTANEOUS | Status: DC
  Filled 2020-12-07: qty 0.1

## 2020-12-07 MED ORDER — INSULIN GLARGINE 100 UNIT/ML ~~LOC~~ SOLN
8.0000 [IU] | Freq: Once | SUBCUTANEOUS | Status: AC
  Administered 2020-12-07: 8 [IU] via SUBCUTANEOUS
  Filled 2020-12-07: qty 0.08

## 2020-12-07 NOTE — Plan of Care (Signed)

## 2020-12-07 NOTE — Progress Notes (Signed)
Went to check on patient at start of night shift.  He was sleeping in his bed.  Did not wake him

## 2020-12-07 NOTE — Progress Notes (Signed)
Physical Therapy Treatment Patient Details Name: Christopher Fernandez MRN: [id number] DOB: [birth year] Today's Date: 12/07/2020    History of Present Illness Patient is a 50 y/o male who presented directly from [facility] clinic with back pain on 3/18 with concern for osteomyelitis T9-T10. MRI + for T9-T10 osteomyelitis. s/p L second toe amputation secondary to osteomyelitis on 3/21. Pt now presents with Choledocholithiasis and Elevated LFTs. s/p ERCP on 12/07/19. PMH: chronic pancreatitis, R transmet amputation, alcohol use dependence, anxiety, GERD, diabetic neuropathy, BPH, HTN, DM    PT Comments    Pt supine in bed this session.  Pt eager to mobilize,  He has new complaints of weakness and discomfort in his R hip but still managed session well.  He required reminders for weight bearing on L side in darco shoe.  Continue to recommend no PT follow up.     Follow Up Recommendations  No PT follow up     Equipment Recommendations  None recommended by PT    Recommendations for Other Services       Precautions / Restrictions Precautions Precautions: Fall Required Braces or Orthoses: Other Brace Other Brace: darco shoe, WBAT with shoe donned per Dr. Nona Dell office (per PT, called 9:20 am on 3/22 to verify) Restrictions Weight Bearing Restrictions: Yes LLE Weight Bearing: Weight bearing as tolerated    Mobility  Bed Mobility Overal bed mobility: Needs Assistance Bed Mobility: Supine to Sit     Supine to sit: Supervision     General bed mobility comments: Moved to edge of bed with supervision.    Transfers Overall transfer level: Modified independent Equipment used: Quad cane Transfers: Sit to/from Stand Sit to Stand: Modified independent (Device/Increase time)         General transfer comment: Pt required assistance to donn darco shoe.  No assistance to achieve standing.  Utilized R shoe to even out height difference from [place].  Ambulation/Gait Ambulation/Gait assistance:  Supervision Gait Distance (Feet): 250 Feet Assistive device: Quad cane Gait Pattern/deviations: Wide base of support;Step-to pattern;Decreased step length - right;Decreased step length - left;Decreased weight shift to right     General Gait Details: Supervision for safety with poor awareness of weight bearing.  Pt noted to roll foot forward onto his toes and required cues to keep the weight off the ball of his foot.   Stairs Stairs: Yes Stairs assistance: Supervision Stair Management: With cane;Step to pattern;Forwards Number of Stairs: 2 General stair comments: Cues for sequencing and safety.  Tolerated well.   Wheelchair Mobility    Modified Rankin (Stroke Patients Only)       Balance Overall balance assessment: Mild deficits observed, not formally tested                                          Cognition Arousal/Alertness: Awake/alert Behavior During Therapy: WFL for tasks assessed/performed Overall Cognitive Status: Within Functional Limits for tasks assessed                                        Exercises      General Comments        Pertinent Vitals/Pain Pain Assessment: Faces Faces Pain Scale: Hurts little more Pain Location: L foot<R hip Pain Descriptors / Indicators: Sore;Operative site guarding;Discomfort Pain Intervention(s): Monitored during session;Repositioned  Home Living                      Prior Function            PT Goals (current goals can now be found in the care plan section) Acute Rehab PT Goals Patient Stated Goal: less pain, go home Potential to Achieve Goals: Good Progress towards PT goals: Progressing toward goals    Frequency    Min 5X/week      PT Plan Current plan remains appropriate    Co-evaluation              AM-PAC PT "6 Clicks" Mobility   Outcome Measure  Help needed turning from your back to your side while in a flat bed without using bedrails?:  None Help needed moving from lying on your back to sitting on the side of a flat bed without using bedrails?: None Help needed moving to and from a bed to a chair (including a wheelchair)?: None Help needed standing up from a chair using your arms (e.g., wheelchair or bedside chair)?: None Help needed to walk in hospital room?: A Little Help needed climbing 3-5 steps with a railing? : A Little 6 Click Score: 22    End of Session Equipment Utilized During Treatment: Gait belt Activity Tolerance: Patient tolerated treatment well Patient left: in chair;with call bell/phone within reach Nurse Communication: Mobility status PT Visit Diagnosis: Unsteadiness on feet (R26.81);Muscle weakness (generalized) (M62.81)     Time: 4656-8127 PT Time Calculation (min) (ACUTE ONLY): 14 min  Charges:  $Gait Training: 8-22 mins                     Erasmo Leventhal , PTA Acute Rehabilitation Services Pager [phone] Office [phone]     Pasha Broad Eli Hose 12/07/2020, 3:40 PM

## 2020-12-07 NOTE — Progress Notes (Signed)
Subjective: No complaints.  Feeling very well.  Objective: Vital signs in last 24 hours: Temp:  [97.3 F (36.3 C)-99.1 F (37.3 C)] 97.3 F (36.3 C) (03/26 0731) Pulse Rate:  [60-145] 64 (03/26 0731) Resp:  [14-19] 17 (03/26 0731) BP: (114-156)/(76-99) 119/82 (03/26 0731) SpO2:  [85 %-100 %] 97 % (03/26 0731) Last BM Date: 12/04/20  Intake/Output from previous day: 03/25 0701 - 03/26 0700 In: 1500 [P.O.:500; I.V.:800; IV Piggyback:200] Out: 0  Intake/Output this shift: No intake/output data recorded.  General appearance: alert and no distress Resp: clear to auscultation bilaterally Cardio: regular rate and rhythm GI: soft, non-tender; bowel sounds normal; no masses,  no organomegaly Extremities: extremities normal, atraumatic, no cyanosis or edema  Lab Results: Recent Labs    12/04/20 0907 12/05/20 0500 12/07/20 0237  WBC 7.3 7.3 9.5  HGB 10.6* 10.4* 10.8*  HCT 32.7* 31.3* 32.6*  PLT 546* 509* 598*   BMET Recent Labs    12/05/20 0500 12/06/20 0154 12/07/20 0237  NA 131* 134* 131*  K 4.1 4.2 5.6*  CL 98 100 97*  CO2 25 25 25   GLUCOSE 295* 196* 418*  BUN 11 11 16   CREATININE 0.79 0.78 0.89  CALCIUM 8.4* 8.7* 8.4*   LFT Recent Labs    12/07/20 0237  PROT 6.7  ALBUMIN 2.4*  AST 28  ALT 65*  ALKPHOS 153*  BILITOT 0.6   PT/INR No results for input(s): LABPROT, INR in the last 72 hours. Hepatitis Panel No results for input(s): HEPBSAG, HCVAB, HEPAIGM, HEPBIGM in the last 72 hours. C-Diff No results for input(s): CDIFFTOX in the last 72 hours. Fecal Lactopherrin No results for input(s): FECLLACTOFRN in the last 72 hours.  Studies/Results: DG ERCP BILIARY & PANCREATIC DUCTS  Result Date: 12/06/2020 CLINICAL DATA:  Biliary ductal dilatation post cholecystectomy EXAM: ERCP TECHNIQUE: Multiple spot images obtained with the fluoroscopic device and submitted for interpretation post-procedure. COMPARISON:  Ultrasound 12/04/2020, and previous FINDINGS: A  series of fluoroscopic spot images document endoscopic cannulation and opacification of the CBD with balloon dilatation of the distal CBD and balloon catheter passage through the duct. The CBD and central intrahepatic ducts are dilated. No evidence of extravasation. IMPRESSION: Endoscopic CBD cannulation and intervention. These images were submitted for radiologic interpretation only. Please see the procedural report for the amount of contrast and the fluoroscopy time utilized. Electronically Signed   By: Lucrezia Europe M.Fernandez.   On: 12/06/2020 15:50    Medications:  Scheduled: . acetaminophen  650 mg Oral Q6H  . ARIPiprazole  5 mg Oral QPM  . DULoxetine  60 mg Oral Daily  . enoxaparin (LOVENOX) injection  55 mg Subcutaneous Q24H  . fluconazole  200 mg Oral Daily  . gabapentin  600 mg Oral TID  . insulin aspart  0-15 Units Subcutaneous TID WC  . insulin aspart  0-5 Units Subcutaneous QHS  . insulin glargine  10 Units Subcutaneous Daily  . lidocaine  1 patch Transdermal Q24H  . lipase/protease/amylase  36,000 Units Oral TID WC  . mirtazapine  30 mg Oral QHS  . mometasone-formoterol  2 puff Inhalation BID  . montelukast  10 mg Oral q AM  . pantoprazole  40 mg Oral Daily  . propranolol ER  80 mg Oral Daily   Continuous: . DAPTOmycin (CUBICIN)  IV 1,000 mg (12/06/20 2001)    Assessment/Plan: 1) Biliary sludge. 2) Mildly elevated liver enzymes.   The patient is well.  In fact, he reports feeling the best since he  was admitted.  He denies any abdominal pain.  The liver enzymes s/p ERCP trended downwards.  It is unclear if the ERCP resolved any pain issues.  Plan: 1) Continue treatment for the discitis. 2) Signing off.  Call with any questions. 3) Follow up with Dr. Rush Landmark 1-2 weeks upon discharge.  LOS: 8 days   Christopher Fernandez 12/07/2020, 8:02 AM

## 2020-12-07 NOTE — Progress Notes (Signed)
Spoke with Dr. Baxter Flattery (ID) for clarification and recommendations for further imaging as well as PICC line placement.  Patient recently had MRI of right hip on 3/21, patient is reporting an increase in subjective weakness as well as increased pain in the area.  There is a confounding picture between if the infection of his hip could be worsening or affecting muscles or if it is due to the recent change in pain medications as patient has had some lab abnormalities that have limited our usage of NSAIDs, which the patient reports is what controls his pain.  Patient stated that he was under the impression that the MR of the hip needed to be done before a PICC line was placed.  We clarified with Dr. Baxter Flattery who stated that since the repeat blood cultures have been negative thus far, PICC line could be placed prior to the MRI and did not see a problem with placing the order today.  At this time we will place the PICC line order and hold off on the MRI until Monday or Tuesday if the pain continues to worsen in his hip.  Laneya Gasaway, DO

## 2020-12-07 NOTE — Progress Notes (Addendum)
Family Medicine Teaching Service Daily Progress Note Intern Pager: [phone]  Patient name: Christopher Fernandez Medical record number: [id number] Date of birth: February 20, 1971 Age: 50 y.o. Gender: male  Primary Care Provider: Danna Hefty, DO Consultants: Ortho, ID, GI Code Status: FULL  Pt Overview and Major Events to Date:  3/18: admitted for c/f thoracic discitis vs osteomyelitis 3/19: MR confirmed T9-T10 osteomyelitis, also left foot 2nd digit osteomyelitis with abscess 3/21: Amputation of left 2nd toe with resection of metatarsal head  3/25: ERCP: biliary sludge, severely dilated bile duct, pancreatic stent placed  Assessment and Plan: Christopher Fernandez a 50 y.o.male who presented with back pain, found to have osteomyelitis of T9/T10. Additionally now with MRSA bacteremia and concern for right hip pyomyositis. PMH is significant forcellulitis, diabetes, neuropathy, anemia, hyperlipidemia, asthma, alcohol abuse, tobacco use disorder, trauma, cholecystectomy, asplenia, anxietyanddepression.  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 Reassuringly, repeat blood cultures have been negative.  -ID following, appreciate recommendations -Continue Daptomycin (3/22-) -Weekly CK (to be done 3/29) -Pain regimen:  Tylenol 650 mg q6h PRN mild pain  Discontinuing Ibuprofen given hyperkalemia  Gabapentin 600 mg TID  Lidocaine patch qd  Oxycodone IR 10 mg q4h PRN severe, breakthrough pain -Continue PT/OT   Hyperkalemia: Acute K 5.6 this AM, increased from 4.2.  -EKG -D/c Ibuprofen  -Lantus 10U as below -Repeat BMP at noon  Pseudohyponatremia 2/2 hyperglycemia Na 131, corrected to 136.  -T2DM control as below   Right Hip Pain Continues to have right hip pain, worse with movement. ID raised concern about possible pyomyositis. This  should be further evaluated by MRI.  -Pain regimen as above -Plan for MRI 3/28 or 3/29.   RUQ pain 2/2 Biliary sludge ERCP performed yesterday which showed suspicion for sludge. Per report: the entire main bile duct was severely dilated, a biliary sphincterotomy and sphincteroplasty performed. Temporary plastic pancreatitic stent was placed into the ventral pancreatic duct to decrease risk of post-procedure pancreatitis.  Patient tolerated procedure well and was able to tolerate PO without nausea, vomiting or abdominal pain that would be concerning for an acute flare of his chronic pancreatitis. AST/ALT stable.  -GI recs: KUB 2-view in 10-14 days to ensure pancreatic stent has migrated successfully. If still present will require EGD.   T2DM with neuropathy  CBGs ranging 141-415. HS coverage added due to elevated CBG of 387 last night. Patient has required 10U SSI in last 24 hours. Suspect elevation in glucose attributed to dexamethasone received and also no Lantus yesterday (likely due to NPO state for ERCP). Patient will require increase to mSSI and carb modified diet for improved control. -Start Lantus to 10U daily  -CBG AC/qHS -mSSI with HS coverage  -Continue home gabapentin   Esophageal Candidiasis  Seen on EGD 3/9. -Continue fluconazole 200 mg daily until April 6th.   Thrombocytosis Throughout this entire hospitalization. Platelets elevated from 509 to 598 this AM.  -Monitor with CBC  Mild Constipation -MiraLAX daily PRN   Pancreatic insufficiencychronic pancreatitis - Continue Creon 36, 000 units with meals  Hyperlipidemia: Chronic, stable -Continue home atorvastatin daily  Hx asthma: Chronic, stable - Dulera, per formulary - Albuterol PRN  Hx alcohol abuse  Tobacco use disorder -Encourage smoking cessation  Hx Tremor: chronic, stable - Continue propranolol 80 mg daily  Hx Asplenia, Cholecystectomy: chronic, stable -Consider Meningococal vaccine as  outpatient  Anxiety with Depression: Chronic, stable - Continue home aripirazole, cymbalta, and mirtazapine   FEN/GI: Regular diet  PPx: Lovenox    Status is: Inpatient  Remains inpatient appropriate because:IV treatments appropriate due to intensity of illness or inability to take PO and Inpatient level of care appropriate due to severity of illness   Dispo: The patient is from: Home              Anticipated d/c is to: Home              Patient currently is not medically stable to d/c.   Difficult to place patient No   Subjective:  Christopher Fernandez feels well this morning. Had some abdominal pain last night but overall feels that his pain is improved since procedure. He would like his Advil to be switched back to Motrin as this seems to work better for him. Would also like stool softener. Had BM yesterday but feels a little constipated.   Objective: Temp:  [97.3 F (36.3 C)-99.1 F (37.3 C)] 97.3 F (36.3 C) (03/26 0731) Pulse Rate:  [56-145] 64 (03/26 0731) Resp:  [14-19] 17 (03/26 0731) BP: (114-156)/(76-99) 119/82 (03/26 0731) SpO2:  [85 %-100 %] 97 % (03/26 0731) Physical Exam: General: Awake, alert, in no distress, pleasant Cardiovascular: RRR without murmur Respiratory: CTAB without wheezing/rhonchi/rales Abdomen: soft, non-distended, mild tenderness to LLQ without rebound/guarding  Extremities: no edema  Laboratory: Recent Labs  Lab 12/04/20 0907 12/05/20 0500 12/07/20 0237  WBC 7.3 7.3 9.5  HGB 10.6* 10.4* 10.8*  HCT 32.7* 31.3* 32.6*  PLT 546* 509* 598*   Recent Labs  Lab 12/05/20 0500 12/06/20 0154 12/07/20 0237  NA 131* 134* 131*  K 4.1 4.2 5.6*  CL 98 100 97*  CO2 25 25 25   BUN 11 11 16   CREATININE 0.79 0.78 0.89  CALCIUM 8.4* 8.7* 8.4*  PROT 6.3* 6.5 6.7  BILITOT 0.3 0.6 0.6  ALKPHOS 166* 149* 153*  ALT 86* 79* 65*  AST 31 46* 28  GLUCOSE 295* 196* 418*   Imaging/Diagnostic Tests: ERCP 3/25 - Gastritis - biopsied. - No gross lesions in  the duodenal bulb, in the first portion of the duodenum and in the second portion of the duodenum. - The major papilla appeared edematous. - Initial attempts at biliary cannulation led to PD wire placement. Double wire technique attempted but unsuccessful. Proceeded with Mikey Bussing technique and PD sphincterotomy in direction of biliary tree to try and open the biliary orifice further. This eventually led to biliary cannulation. - After pancreatic sphincterotomy, quick sweep of the pancreatic duct led to a few pancreatic stone fragments being removed. Intention was not for PD work today, so this was consider only partial removal. - The biliary fluoroscopic examination was suspicious for sludge. - The entire main bile duct was severely dilated. - A biliary sphincterotomy was performed. Biliary sphincteroplasty performed. - The biliary tree was swept and sludge was found. - One temporary plastic pancreatic stent was placed into the ventral pancreatic duct to decrease risk of PEP pancreatitis.   Sharion Settler, DO 12/07/2020, 7:32 AM PGY-1, [place] Intern pager: [phone], text pages welcome

## 2020-12-07 NOTE — Progress Notes (Signed)
Spoke with RN re PICC order.  Plan on PICC placement Sunday 12-08-20.  Pt aware.

## 2020-12-07 NOTE — Progress Notes (Signed)
Contacted on call at [phone] regarding glucose of 430.  On call already put in order for Lantus 10 units to give now.  She did not address any short acting dose at this time.  She also placed an order for EKG for potassium of 5.6.

## 2020-12-08 DIAGNOSIS — B9562 Methicillin resistant Staphylococcus aureus infection as the cause of diseases classified elsewhere: Secondary | ICD-10-CM | POA: Diagnosis not present

## 2020-12-08 DIAGNOSIS — R7881 Bacteremia: Secondary | ICD-10-CM | POA: Diagnosis not present

## 2020-12-08 LAB — COMPREHENSIVE METABOLIC PANEL
ALT: 53 U/L — ABNORMAL HIGH (ref 0–44)
AST: 22 U/L (ref 15–41)
Albumin: 2.5 g/dL — ABNORMAL LOW (ref 3.5–5.0)
Alkaline Phosphatase: 143 U/L — ABNORMAL HIGH (ref 38–126)
Anion gap: 7 (ref 5–15)
BUN: 11 mg/dL (ref 6–20)
CO2: 29 mmol/L (ref 22–32)
Calcium: 8.7 mg/dL — ABNORMAL LOW (ref 8.9–10.3)
Chloride: 99 mmol/L (ref 98–111)
Creatinine, Ser: 0.8 mg/dL (ref 0.61–1.24)
GFR, Estimated: >60 mL/min (ref >60)
Glucose, Bld: 257 mg/dL — ABNORMAL HIGH (ref 70–99)
Potassium: 4.4 mmol/L (ref 3.5–5.1)
Sodium: 135 mmol/L (ref 135–145)
Total Bilirubin: 0.4 mg/dL (ref 0.3–1.2)
Total Protein: 6.6 g/dL (ref 6.5–8.1)

## 2020-12-08 LAB — GLUCOSE, CAPILLARY
Glucose-Capillary: 234 mg/dL — ABNORMAL HIGH (ref 70–99)
Glucose-Capillary: 268 mg/dL — ABNORMAL HIGH (ref 70–99)
Glucose-Capillary: 233 mg/dL — ABNORMAL HIGH (ref 70–99)
Glucose-Capillary: 193 mg/dL — ABNORMAL HIGH (ref 70–99)

## 2020-12-08 MED ORDER — BACLOFEN 10 MG PO TABS
5.0000 mg | ORAL_TABLET | Freq: Every day | ORAL | Status: DC
  Administered 2020-12-08: 5 mg via ORAL
  Filled 2020-12-08: qty 1

## 2020-12-08 MED ORDER — SODIUM CHLORIDE 0.9% FLUSH: 10.0000 mL | INTRAVENOUS | Status: DC | PRN

## 2020-12-08 MED ORDER — CHLORHEXIDINE GLUCONATE CLOTH 2 % EX PADS
6.0000 | MEDICATED_PAD | Freq: Every day | CUTANEOUS | Status: DC
  Administered 2020-12-09 – 2020-12-10 (×3): 6 via TOPICAL

## 2020-12-08 MED ORDER — DOCUSATE SODIUM 100 MG PO CAPS
100.0000 mg | ORAL_CAPSULE | Freq: Two times a day (BID) | ORAL | Status: DC
  Administered 2020-12-08 – 2020-12-10 (×5): 100 mg via ORAL
  Filled 2020-12-08 (×5): qty 1

## 2020-12-08 MED ORDER — IBUPROFEN 100 MG/5ML PO SUSP
800.0000 mg | Freq: Two times a day (BID) | ORAL | Status: DC
  Administered 2020-12-08 (×2): 800 mg via ORAL
  Filled 2020-12-08 (×3): qty 40

## 2020-12-08 NOTE — Progress Notes (Signed)
Peripherally Inserted Central Catheter Placement  The IV Nurse has discussed with the patient and/or persons authorized to consent for the patient, the purpose of this procedure and the potential benefits and risks involved with this procedure.  The benefits include less needle sticks, lab draws from the catheter, and the patient may be discharged home with the catheter. Risks include, but not limited to, infection, bleeding, blood clot (thrombus formation), and puncture of an artery; nerve damage and irregular heartbeat and possibility to perform a PICC exchange if needed/ordered by physician.  Alternatives to this procedure were also discussed.  Bard Power PICC patient education guide, fact sheet on infection prevention and patient information card has been provided to patient /or left at bedside.    PICC Placement Documentation  PICC Single Lumen 82/70/78 PICC Right Basilic 42 cm 0 cm (Active)  Indication for Insertion or Continuance of Line Home intravenous therapies (PICC only) 12/08/20 1806  Exposed Catheter (cm) 0 cm 12/08/20 1806  Site Assessment Clean;Dry;Intact 12/08/20 1806  Line Status Flushed;Saline locked;Blood return noted 12/08/20 1806  Dressing Type Transparent 12/08/20 1806  Dressing Status Clean;Dry;Intact 12/08/20 1806  Antimicrobial disc in place? Yes 12/08/20 1806  Safety Lock Not Applicable 67/54/49 2010  Line Care Connections checked and tightened 12/08/20 1806  Line Adjustment (NICU/IV Team Only) No 12/08/20 1806  Dressing Intervention New dressing 12/08/20 1806  Dressing Change Due 12/15/20 12/08/20 1806       [name] 12/08/2020, 6:07 PM

## 2020-12-08 NOTE — Progress Notes (Signed)
Family Medicine Teaching Service Daily Progress Note Intern Pager: [phone]  Patient name: Christopher Fernandez Medical record number: [id number] Date of birth: 1971/06/10 Age: 50 y.o. 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 Additionally now with MRSA bacteremia and concern for right hip pyomyositis. PMH is significant forcellulitis, diabetes, neuropathy, anemia, hyperlipidemia, asthma, alcohol abuse, tobacco use disorder, trauma, cholecystectomy, asplenia, anxietyanddepression.  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 Reassuringly, repeat blood cultures have been negative. Plan to place PICC line today. -ID following, appreciate recommendations -Continue Daptomycin (3/22-) -Weekly CK (to be done 3/29) -Pain regimen:             Tylenol 650 mg q6h PRN mild pain             Gabapentin 600 mg TID             Lidocaine patch qd             Oxycodone IR 10 mg q4h PRN severe, breakthrough pain              Ibuprofen 800 mg BID -Continue PT/OT   Hyperkalemia: Acute- resolved K 4.4, down from 5.2  - AM BMP to monitor  Pseudohyponatremia 2/2 hyperglycemia- resolved Today WNL at 135 -T2DM control as below   Right Hip Pain Continues to have right hip pain, worse with movement. ID raised concern about possible  pyomyositis. Recommendation from ID is to get MR of hip tomorrow, Monday, 3/28. -Pain regimen as above -Plan for MRI 3/28  RUQ pain 2/2 Biliary sludge ERCP performed 3/25 which showed suspicion for sludge. Temporary plastic pancreatitic stent was placed into the ventral pancreatic duct to decrease risk of post-procedure pancreatitis. So far patient has had small twinges of abdominal pain that are like chronic pancreatitis, but pain is fleeting. AST/ALT downtrending and appropriate. -GI recs: KUB 2-view in 10-14 days to ensure pancreatic stent has migrated successfully. If still present will require EGD.  -Monitor pain, CMP  T2DM with neuropathy  CBGs ranging [phone]. Patient had BG elevated to 400s 3/26. Suspect elevation in glucose attributed to dexamethasone received and also no Lantus yesterday (likely due to NPO state for ERCP).  -Increase Lantus to 18U daily  -CBG AC/qHS -mSSI with HS coverage  -Continue home gabapentin   Esophageal Candidiasis  Seen on EGD 3/9. -Continue fluconazole 200 mg daily until April 6th.   Thrombocytosis Throughout this entire hospitalization. Platelets last checked 3/26 and as high as 598, have been in 400-500 range throughout admission. -Monitor with CBC  Mild Constipation Patient has not had BM, of note on narcotics for significant pain. Recommend miralax and senna, but patient prefers dulcolax. -MiraLAX daily PRN -Colace prn  Pancreatic insufficiencychronic pancreatitis - Continue Creon 36, 000 units with meals  Hyperlipidemia: Chronic, stable Hold home atorvastatin due to choledocolithiasis/ERCP/elevation in transaminases -Restart statin on d/c  Hx  asthma: Chronic, stable - Dulera, per formulary - Albuterol PRN  Hx alcohol abuse  Tobacco use disorder -Encourage smoking cessation  Hx Tremor: chronic, stable - Continue propranolol 80 mg daily  Hx Asplenia, Cholecystectomy: chronic, stable -Consider Meningococal  vaccine as outpatient  Anxiety with Depression: Chronic, stable - Continue homearipirazole, cymbalta, and mirtazapine  FEN/GI: Carb modified diet, protonix PPx: lovenox  Status is: Inpatient  Remains inpatient appropriate because:Ongoing active pain requiring inpatient pain management, IV treatments appropriate due to intensity of illness or inability to take PO and Inpatient level of care appropriate due to severity of illness   Dispo:  Patient From: Home  Planned Disposition: Home  Medically stable for discharge: No    Subjective:  Patient overall feels improved from previous days. Still notes some pain in right hip, NSAIDs work better, will restart. Notably constipated, prefers dulcolax. Some eipgastric/RUQ twinges of pain, but pass quickly.   Objective: Temp:  [97.5 F (36.4 C)-97.8 F (36.6 C)] 97.8 F (36.6 C) (03/27 0840) Pulse Rate:  [55-80] 80 (03/27 0840) Resp:  [16-18] 16 (03/27 0840) BP: (116-124)/(84-95) 116/87 (03/27 0840) SpO2:  [90 %-99 %] 99 % (03/27 0840) Physical Exam: General: age-appropriate, AAM, resting comfortably in bed, NAD, obese Cardiovascular: RRR, no m/r/g Respiratory: CTAB, no iWOB Abdomen: soft, NT, ND, normoactive bowel sounds Extremities: warm, dry, no edema  Laboratory: Recent Labs  Lab 12/04/20 0907 12/05/20 0500 12/07/20 0237  WBC 7.3 7.3 9.5  HGB 10.6* 10.4* 10.8*  HCT 32.7* 31.3* 32.6*  PLT 546* 509* 598*   Recent Labs  Lab 12/06/20 0154 12/07/20 0237 12/07/20 1235 12/08/20 0130  NA 134* 131* 132* 135  K 4.2 5.6* 4.7 4.4  CL 100 97* 97* 99  CO2 25 25 26 29   BUN 11 16 12 11   CREATININE 0.78 0.89 0.93 0.80  CALCIUM 8.7* 8.4* 8.4* 8.7*  PROT 6.5 6.7  --  6.6  BILITOT 0.6 0.6  --  0.4  ALKPHOS 149* 153*  --  143*  ALT 79* 65*  --  53*  AST 46* 28  --  22  GLUCOSE 196* 418* 449* 257*   Imaging/Diagnostic Tests: DG ERCP BILIARY & PANCREATIC DUCTS  Result Date: 12/06/2020 CLINICAL DATA:  Biliary ductal dilatation  post cholecystectomy EXAM: ERCP TECHNIQUE: Multiple spot images obtained with the fluoroscopic device and submitted for interpretation post-procedure. COMPARISON:  Ultrasound 12/04/2020, and previous FINDINGS: A series of fluoroscopic spot images document endoscopic cannulation and opacification of the CBD with balloon dilatation of the distal CBD and balloon catheter passage through the duct. The CBD and central intrahepatic ducts are dilated. No evidence of extravasation. IMPRESSION: Endoscopic CBD cannulation and intervention. These images were submitted for radiologic interpretation only. Please see the procedural report for the amount of contrast and the fluoroscopy time utilized. Electronically Signed   By: Lucrezia Europe M.D.   On: 12/06/2020 15:50   ECHO TEE  Result Date: 12/04/2020    TRANSESOPHOGEAL ECHO REPORT   Patient Name:   Christopher Fernandez Date of Exam: 12/04/2020 Medical Rec #:  [id number]      Height:       75.0 in Accession #:    [id number]     Weight:       254.2 lb Date of Birth:  [birth year]       BSA:          2.430 m Patient Age:    23 years       BP:  122/71 mmHg Patient Gender: M              HR:           78 bpm. Exam Location:  Inpatient Procedure: Transesophageal Echo, Cardiac Doppler and Color Doppler Indications:     Bacteremia  History:         Patient has prior history of Echocardiogram examinations, most                  recent 12/01/2020. Risk Factors:Diabetes. MRSA.  Sonographer:     Dustin Flock Referring Phys:  [phone] Karoline Caldwell Diagnosing Phys: [name] MD PROCEDURE: The transesophogeal probe was passed without difficulty through the esophogus of the patient. Sedation performed by performing physician. The patient was monitored while under deep sedation. Anesthestetic sedation was provided intravenously by  Anesthesiology: 244.84mg  of Propofol, 100mg  of Lidocaine. The patient developed no complications during the procedure. IMPRESSIONS  1. Left ventricular  ejection fraction, by estimation, is 55 to 60%. The left ventricle has normal function. The left ventricle has no regional wall motion abnormalities.  2. Right ventricular systolic function is normal. The right ventricular size is normal.  3. No left atrial/left atrial appendage thrombus was detected.  4. The mitral valve is normal in structure. Trivial mitral valve regurgitation.  5. The aortic valve is normal in structure. Aortic valve regurgitation is not visualized. FINDINGS  Left Ventricle: Left ventricular ejection fraction, by estimation, is 55 to 60%. The left ventricle has normal function. The left ventricle has no regional wall motion abnormalities. The left ventricular internal cavity size was normal in size. Right Ventricle: The right ventricular size is normal. No increase in right ventricular wall thickness. Right ventricular systolic function is normal. Left Atrium: Left atrial size was normal in size. No left atrial/left atrial appendage thrombus was detected. Right Atrium: Right atrial size was normal in size. Pericardium: There is no evidence of pericardial effusion. Mitral Valve: The mitral valve is normal in structure. Trivial mitral valve regurgitation. There is no evidence of mitral valve vegetation. Tricuspid Valve: The tricuspid valve is grossly normal. Tricuspid valve regurgitation is mild. Aortic Valve: The aortic valve is normal in structure. Aortic valve regurgitation is not visualized. There is no evidence of aortic valve vegetation. Pulmonic Valve: The pulmonic valve was normal in structure. Pulmonic valve regurgitation is trivial. Aorta: The aortic root and ascending aorta are structurally normal, with no evidence of dilitation. IAS/Shunts: No atrial level shunt detected by color flow Doppler. [name] MD Electronically signed by [name] MD Signature Date/Time: 12/04/2020/3:22:28 PM    Final    [place] EKG SITE RITE  Result Date: 12/07/2020 If Site Rite image not attached,  placement could not be confirmed due to current cardiac rhythm.  US Abdomen Limited RUQ (LIVER/GB)  Result Date: 12/04/2020 CLINICAL DATA:  Abdominal pain, history COPD, diabetes mellitus, hypertension EXAM: ULTRASOUND ABDOMEN LIMITED RIGHT UPPER QUADRANT COMPARISON:  CT abdomen pelvis 10/23/2020 FINDINGS: Gallbladder: Surgically absent Common bile duct: Diameter: 19 mm diameter, dilated, increased from the 14 mm on the prior CT. No definite common duct stones. Liver: Normal parenchymal echogenicity. No mass or nodularity. Intrahepatic biliary dilatation present. Portal vein is patent on color Doppler imaging with normal direction of blood flow towards the liver. Other: No RIGHT upper quadrant free fluid. IMPRESSION: Post cholecystectomy. Intrahepatic and extrahepatic biliary dilatation with CBD now 19 mm, increased since 10/23/2020; recommend correlation with LFTs. Electronically Signed   By: Lavonia Dana M.D.   On:  12/04/2020 11:54   Gladys Damme, MD 12/08/2020, 8:53 AM PGY-2, [place] Intern pager: [phone], text pages welcome

## 2020-12-09 ENCOUNTER — Other Ambulatory Visit: Payer: Self-pay

## 2020-12-09 DIAGNOSIS — R7401 Elevation of levels of liver transaminase levels: Secondary | ICD-10-CM

## 2020-12-09 DIAGNOSIS — E11621 Type 2 diabetes mellitus with foot ulcer: Secondary | ICD-10-CM

## 2020-12-09 DIAGNOSIS — L97529 Non-pressure chronic ulcer of other part of left foot with unspecified severity: Secondary | ICD-10-CM

## 2020-12-09 DIAGNOSIS — K861 Other chronic pancreatitis: Secondary | ICD-10-CM

## 2020-12-09 LAB — COMPREHENSIVE METABOLIC PANEL
ALT: 269 U/L — ABNORMAL HIGH (ref 0–44)
ALT: 340 U/L — ABNORMAL HIGH (ref 0–44)
AST: 558 U/L — ABNORMAL HIGH (ref 15–41)
AST: 323 U/L — ABNORMAL HIGH (ref 15–41)
Albumin: 2.3 g/dL — ABNORMAL LOW (ref 3.5–5.0)
Albumin: 2.5 g/dL — ABNORMAL LOW (ref 3.5–5.0)
Alkaline Phosphatase: 163 U/L — ABNORMAL HIGH (ref 38–126)
Alkaline Phosphatase: 209 U/L — ABNORMAL HIGH (ref 38–126)
Anion gap: 6 (ref 5–15)
Anion gap: 6 (ref 5–15)
BUN: 14 mg/dL (ref 6–20)
BUN: 9 mg/dL (ref 6–20)
CO2: 30 mmol/L (ref 22–32)
CO2: 31 mmol/L (ref 22–32)
Calcium: 8.6 mg/dL — ABNORMAL LOW (ref 8.9–10.3)
Calcium: 8.8 mg/dL — ABNORMAL LOW (ref 8.9–10.3)
Chloride: 100 mmol/L (ref 98–111)
Chloride: 99 mmol/L (ref 98–111)
Creatinine, Ser: 0.78 mg/dL (ref 0.61–1.24)
Creatinine, Ser: 0.81 mg/dL (ref 0.61–1.24)
GFR, Estimated: >60 mL/min (ref >60)
GFR, Estimated: >60 mL/min (ref >60)
Glucose, Bld: 196 mg/dL — ABNORMAL HIGH (ref 70–99)
Glucose, Bld: 138 mg/dL — ABNORMAL HIGH (ref 70–99)
Potassium: 4 mmol/L (ref 3.5–5.1)
Potassium: 4.1 mmol/L (ref 3.5–5.1)
Sodium: 136 mmol/L (ref 135–145)
Sodium: 136 mmol/L (ref 135–145)
Total Bilirubin: 0.5 mg/dL (ref 0.3–1.2)
Total Bilirubin: 0.4 mg/dL (ref 0.3–1.2)
Total Protein: 6.2 g/dL — ABNORMAL LOW (ref 6.5–8.1)
Total Protein: 6.6 g/dL (ref 6.5–8.1)

## 2020-12-09 LAB — CBC
HCT: 29.2 % — ABNORMAL LOW (ref 39.0–52.0)
Hemoglobin: 9.4 g/dL — ABNORMAL LOW (ref 13.0–17.0)
MCH: 28.2 pg (ref 26.0–34.0)
MCHC: 32.2 g/dL (ref 30.0–36.0)
MCV: 87.7 fL (ref 80.0–100.0)
Platelets: 553 10*3/uL — ABNORMAL HIGH (ref 150–400)
RBC: 3.33 MIL/uL — ABNORMAL LOW (ref 4.22–5.81)
RDW: 13.8 % (ref 11.5–15.5)
WBC: 7.9 10*3/uL (ref 4.0–10.5)
nRBC: 0 % (ref 0.0–0.2)

## 2020-12-09 LAB — GLUCOSE, CAPILLARY
Glucose-Capillary: 142 mg/dL — ABNORMAL HIGH (ref 70–99)
Glucose-Capillary: 144 mg/dL — ABNORMAL HIGH (ref 70–99)
Glucose-Capillary: 160 mg/dL — ABNORMAL HIGH (ref 70–99)
Glucose-Capillary: 121 mg/dL — ABNORMAL HIGH (ref 70–99)

## 2020-12-09 LAB — CULTURE, BLOOD (ROUTINE X 2)
Culture: NO GROWTH
Culture: NO GROWTH
Special Requests: ADEQUATE
Special Requests: ADEQUATE

## 2020-12-09 LAB — CK: Total CK: 46 U/L — ABNORMAL LOW (ref 49–397)

## 2020-12-09 MED ORDER — IBUPROFEN 200 MG PO TABS
400.0000 mg | ORAL_TABLET | Freq: Three times a day (TID) | ORAL | Status: DC
  Administered 2020-12-09 – 2020-12-10 (×4): 400 mg via ORAL
  Filled 2020-12-09 (×4): qty 2

## 2020-12-09 MED ORDER — BACLOFEN 10 MG PO TABS
5.0000 mg | ORAL_TABLET | Freq: Two times a day (BID) | ORAL | Status: DC
  Administered 2020-12-09 – 2020-12-10 (×2): 5 mg via ORAL
  Filled 2020-12-09 (×2): qty 1

## 2020-12-09 MED ORDER — ACETAMINOPHEN 500 MG PO TABS
500.0000 mg | ORAL_TABLET | Freq: Four times a day (QID) | ORAL | Status: DC
  Administered 2020-12-09 – 2020-12-10 (×6): 500 mg via ORAL
  Filled 2020-12-09 (×5): qty 1

## 2020-12-09 NOTE — Progress Notes (Addendum)
Called Dr Lyndel Safe regarding elevated LFTs today. He recommended repeat ECRP one week post discharge. If elevated to call GI.  It is reassuring that he does not have fevers or abdominal pain.They will follow him up for repeat ERCP post discharge due to his stent placement.  Lattie Haw MD [place]

## 2020-12-09 NOTE — Progress Notes (Signed)
   Patient without abdominal pain  S/P ERCP with PD stenting 12/06/2020 with Dr. Rush Landmark for CBD stones.  Has been doing well.  His LFTs have increased.  There is no ascending cholangitis or pancreatitis.  He is ready to go home.  Suggest: -Repeat CMP in 1 week. -FU with Dr Danis/M[place]/APP clinic. -He would need x-ray KUB in 10-14 days to determine if pancreatic stent is still in place.  If it is, he would require EGD for PD stent removal. -If his LFTs continue to increase, then would consider further work-up. -Please call with any ?/Concerns. Judson Roch will arrange for follow-up.   Carmell Austria

## 2020-12-09 NOTE — Progress Notes (Signed)
Received phone call from Mrs Sturgill regarding Mr Kentner pain control. She asked if there is anything else we can do for the pain. I explained the reason why we have stopped anti-inflammatories. I said we can add on some tramadol to help with the pain. I said one of the physicians will come around and see him this afternoon. She was appreciative.  Lattie Haw MD  [place]

## 2020-12-09 NOTE — Progress Notes (Signed)
Interim Progress Note  In to talk with patient. He is accompanied by his wife in the room. He expresses overall frustration. He feels the only medication that helps his pain is the Ibuprofen. He understands that this has been held given his bump in his liver enzymes. He would like to repeat a blood test and if liver enzymes are down trending he would like to resume Ibuprofen.   He is also wondering about the MRI of his hip. I attempted to page ID twice earlier this afternoon (around 1PM and 2PM), regarding the MRI of the hip, unfortunately have not received a call back.

## 2020-12-09 NOTE — TOC Progression Note (Addendum)
Transition of Care (TOC) - Progression Note  Marvetta Gibbons RN, BSN Transitions of Care Unit 4E- RN Case Manager See Treatment Team for direct phone # Cross Coverage for 5N    Patient Details  Name: MAAHIR HORST MRN: [id number] Date of Birth: 1970/09/23  Transition of Care [facility]) CM/SW Contact  [name], [name], RN Phone Number: 12/09/2020, 2:34 PM  Clinical Narrative:    Noted pt to go home with [facility] for home IV abx needs, weekend CM tried to [place] services with [name] and other [facility] agencies- however unable to secure services- spoke with Pam at [place]- she will secure [facility] needs with [place] as long as there are no other nursing needs besides the home infusion needs. Per Pam - home infusion arrangements are in place for transition home, education has been completed with pt and family. Noted PICC placed yesterday.   TOC to follow for any further needs- at this time home IV abx needs have been arranged.   Expected Discharge Plan: [place] Barriers to Discharge: Continued Medical Work up  Expected Discharge Plan and Services Expected Discharge Plan: [place]   Discharge Planning Services: CM Consult Post Acute Care Choice: [place] arrangements for the past 2 months: Single Family Home                 DME Arranged: Other see comment (IV ABX THERAPY, [facility])   Date DME Agency Contacted: 12/05/20 Time DME Agency Contacted: [phone] Representative spoke with at DME Agency: [place]: IV Antibiotics HH Agency: [facility] Date [place]: 12/09/20 Time [place]: 1355 Representative spoke with at [place]: [place] ([place]) Interventions    Readmission Risk Interventions No flowsheet data found.

## 2020-12-09 NOTE — Anesthesia Postprocedure Evaluation (Signed)
Anesthesia Post Note  Patient: Christopher Fernandez  Procedure(s) Performed: ENDOSCOPIC RETROGRADE CHOLANGIOPANCREATOGRAPHY (ERCP) WITH PROPOFOL (N/A ) BIOPSY SPHINCTEROTOMY PANCREATIC STENT PLACEMENT BILIARY DILATION REMOVAL OF STONES     Patient location during evaluation: PACU Anesthesia Type: General Level of consciousness: awake Pain management: pain level controlled Vital Signs Assessment: post-procedure vital signs reviewed and stable Respiratory status: spontaneous breathing, nonlabored ventilation, respiratory function stable and patient connected to nasal cannula oxygen Cardiovascular status: blood pressure returned to baseline and stable Postop Assessment: no apparent nausea or vomiting Anesthetic complications: no   No complications documented.  Last Vitals:  Vitals:   12/08/20 2018 12/09/20 0607  BP: 119/74 (!) 150/101  Pulse: 65 (!) 56  Resp: 18 18  Temp: 36.6 C 37.3 C  SpO2: 99% 100%    Last Pain:  Vitals:   12/09/20 0607  TempSrc: Oral  PainSc:                  Karyl Kinnier [place]

## 2020-12-09 NOTE — Plan of Care (Signed)

## 2020-12-09 NOTE — Discharge Summary (Addendum)
[facility] Discharge Summary  Patient name: Christopher Fernandez Medical record number: [id number] Date of birth: [birth year] Age: 50 y.o. 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 rate 16, height 6' 3" (1.905 m), weight 115.3 kg, SpO2 96 %. Physical Exam: General: Awake, alert, in no distress, pleasant  Cardiovascular: RRR without murmur Respiratory: mild inspiratory and expiratory wheezing in b/l upper fields, good air movement, no respiratory distress Abdomen: soft, mild tenderness on palpation to RUQ and LLQ without rebound/guarding, soft Extremities: No edema, 2+ radial and DP pulses b/l   Brief Hospital Course:  Christopher Fernandez is a 50 y.o. male presenting with back pain. PMH is significant for cellulitis, diabetes, neuropathy, anemia, hyperlipidemia, asthma, alcohol abuse, tobacco use disorder, trauma, cholecystectomy, asplenia, anxiety and depression.  MRSA bacteremia  Osteomyelitis of T9/T10 and left foot Patient admitted to the hospital due to concern for osteomyelitis of T9/T10, which was seen on MRCP obtained in the outpatient setting. Patient was stable and afebrile upon admission. Blood cultures were collected and patient was found to have MRSA bacteremia. During admission, a wound was discovered on patient's left second toe which was imaged with MRI and found to have osteomyelitis of the second toe.  Vascular surgery was consulted with recommendations for left second toe amputation, which was completed on 3/21. Treated with Daptomycin for his bacteremia, with instructions to finish 6-week course (until 5/3). A PICC was placed during admission. Weekly CK levels recommended while on antibiotic, his first level was drawn on 3/28 and decreased at 46.    Right Hip Pain MRI of right hip on 3/21 showed concern for early osteomyelitis of superior pubic ramus. Additionally, showed prominent edema and enhancement of the right adductor minimus muscle adjacent to the superior pubic ramus concerning for myositis. A repeat MRI was obtained on 3/29 to assess for pyomyositis and showed improving myositis but worsening of pelvic ramus osteomyelitis.   T2DM with neuropathy During hospitalization, insulin was initially held as patient was not eating in preparation for procedure and insulin was slowly added back and titrated as appropriate. Required up to 18U Lantus. Discharged back on home diabetes regimen.   Esophageal candidiasis  Patient was found to have esophageal candidiasis on an outpatient EGD on 3/09 and was prescribed fluconazole on 3/16. Medication was initially held in the hospital originally as it was unknown if the patient was taking the medication. ID was consulted for the osteomyelitis and bacteremia and medication was restarted on 3/20. GI who had prescribed the medication recommended a stop date of 12/18/20.   LFT abnormalities  Bile Duct Dilatation On 3/23 patient reported RUQ pain and CMP obtained which showed marked elevation in alkaline phosphatase, ALT, AST, and t. Bili. Patient is s/p remote cholecystectomy. Patient had recently undergone MRI/MCRP on March 18 which showed 2 small hypointense filling defects, unable to r/o choledocolithiasis. RUQ US obtained on 3/23 demonstrated increased bile duct dilatation to 19 mm. GI was consulted and recommended an ERCP, which occurred on  3/25 and was  significant for biliary sludge. Pancreatic stent was placed. His LFT's increased on day #3 post-procedure (AST 22>558 and ALT 53>269). Reassuringly, he his abdominal exam was stable and reassuring. GI consulted and recommended follow up in 1-week outpatient for repeat ERCP to ensure successful migration of pancreatic stent. Hepatitis panel obtained and negative. On d/c his LFT's were improving but still elevated (AST 161, ALT 259, Alk phos 184).   All other chronic conditions were stable and home medications given as appropriate.   Issues for follow-up 1. Consider Ozempic outpatient for better glycemic control. 2. Stop fluconazole on 12/18/20. 3. Weekly CK while on Daptomycin (next check 4/4).  4. Should continue Daptomycin until 5/18. 5. Follow up with GI for repeat ERCP week of 4/4.  6. F/u LFT's were elevated but down-trending (see above) on discharge.    Significant Procedures:  Echo 3/20 1. Left ventricular ejection fraction, by estimation, is 60 to 65%. The left ventricle has normal function. The left ventricle has no regional wall motion abnormalities. Left ventricular diastolic parameters were normal.  2. Right ventricular systolic function is normal. The right ventricular size is normal.  3. The pericardial effusion is anterior to the right ventricle.  4. The mitral valve was not well visualized. No evidence of mitral valve regurgitation.  5. The aortic valve was not well visualized. Aortic valve regurgitation is not visualized. Aortic valve area, by VTI measures 4.36 cm. Aortic valve mean gradient measures 3.0 mmHg. Aortic valve Vmax measures 1.19 m/s.  6. Aortic dilatation noted. There is borderline dilatation of the aortic root, measuring 37 mm.  7. The inferior vena cava is normal in size with greater than 50% respiratory variability, suggesting right atrial pressure of 3 mmHg.  8. Poor image quality precludes adequate assessment of valves. Cannot rule out vegetation on mitral  valve in the apical 2 chamber view. The AV cannot be assessed. Suggest TEE for further evaulation.   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 Left ventricular ejection fraction, by estimation, is 55 to 60%. The left ventricle has normal function. The left ventricle has no regional wall motion abnormalities.  2. Right ventricular systolic function is normal. The right ventricular size is normal.  3. No left atrial/left atrial appendage thrombus was detected.  4. The mitral valve is normal in structure. Trivial mitral valve regurgitation.  5. The aortic valve is normal in structure. Aortic valve regurgitation is not visualized.   ERCP 3/25 - Gastritis - biopsied. - No gross lesions in the duodenal bulb, in the first portion of the duodenum and in the second portion of the duodenum. - The major papilla appeared edematous. - Initial attempts at biliary cannulation led to PD wire placement. Double wire technique attempted but unsuccessful. Proceeded with Mikey Bussing technique and PD sphincterotomy in direction of biliary tree to try and open the biliary orifice further. This eventually led to biliary cannulation. - After pancreatic sphincterotomy, quick sweep of the pancreatic duct led to a few pancreatic stone fragments being removed. Intention was not for PD work today, so this was consider only partial removal. - The biliary fluoroscopic examination was suspicious for sludge. - The entire main bile duct was severely dilated. - A biliary sphincterotomy was performed. Biliary sphincteroplasty performed. - The biliary tree was swept and sludge was found. - One temporary plastic pancreatic stent was placed into the ventral pancreatic duct to decrease risk of PEP pancreatitis.  MRI Right  Hip 3/29 IMPRESSION: 1. Worsening osteomyelitis of the parasymphyseal right superior pubic ramus. 2. Improving right adductor minimus myositis  Significant Labs and  Imaging:  Recent Labs  Lab 12/05/20 0500 12/07/20 0237 12/09/20 0320  WBC 7.3 9.5 7.9  HGB 10.4* 10.8* 9.4*  HCT 31.3* 32.6* 29.2*  PLT 509* 598* 553*   Recent Labs  Lab 12/07/20 0237 12/07/20 1235 12/08/20 0130 12/09/20 0320 12/09/20 1551 12/10/20 0212  NA 131* 132* 135 136 136  --   K 5.6* 4.7 4.4 4.0 4.1  --   CL 97* 97* 99 100 99  --   CO2 _0 --   GLUCOSE 418* 449* 257* 196* 138*  --   BUN _1 --   CREATININE 0.89 0.93 0.80 0.78 0.81  --   CALCIUM 8.4* 8.4* 8.7* 8.6* 8.8*  --   ALKPHOS 153*  --  143* 163* 209* 184*  AST 28  --  22 558* 323* 161*  ALT 65*  --  53* 269* 340* 259*  ALBUMIN 2.4*  --  2.5* 2.3* 2.5* 2.3*   Results/Tests Pending at Time of Discharge: None  Discharge Medications:  Allergies as of 12/10/2020      Reactions   Eggs Or Egg-derived Products Rash   Morphine And Related Other (See Comments)   Cant take because of pancreatitis   Cocoa Rash      Medication List    TAKE these medications   acetaminophen 650 MG CR tablet Commonly known as: Tylenol 8 Hour Take 1 tablet (650 mg total) by mouth every 8 (eight) hours as needed for pain.   albuterol 108 (90 Base) MCG/ACT inhaler Commonly known as: VENTOLIN HFA INHALE 2-4 PUFFS INTO THE LUNGS EVERY 4 (FOUR) HOURS AS NEEDED FOR WHEEZING (OR COUGH).   ARIPiprazole 5 MG tablet Commonly known as: ABILIFY TAKE 1 TABLET (5 MG TOTAL) BY MOUTH DAILY. What changed: when to take this   atorvastatin 40 MG tablet Commonly known as: LIPITOR Take 1 tablet (40 mg total) by mouth daily.   bismuth subsalicylate 505 WP/79YI suspension Commonly known as: PEPTO BISMOL Take 30 mLs by mouth every 6 (six) hours as needed for indigestion or diarrhea or loose stools.   cetirizine 10 MG tablet Commonly known as: ZYRTEC Take 1 tablet (10 mg total) by mouth daily. What changed:   when to take this  reasons to take this   cyclobenzaprine 5 MG tablet Commonly known as: FLEXERIL Take  1 tablet (5 mg total) by mouth 2 (two) times daily as needed for muscle spasms.   daptomycin  IVPB Commonly known as: CUBICIN Inject 1,000 mg into the vein daily. Indication:  Osteomyelitis First Dose: No Last Day of Therapy:  01/29/2021 Labs - Once weekly:  CBC/D, BMP, and CPK Labs - Every other week:  ESR and CRP Method of administration: IV Push Method of administration may be changed at the discretion of home infusion pharmacist based upon assessment of the patient and/or caregiver's ability to self-administer the medication ordered.   Dexcom G6 Receiver Devi 1 Device by Does not apply route 4 (four) times daily.   Dexcom G6 Transmitter Misc 4 (four) times daily. as directed   diclofenac 50 MG EC tablet Commonly known as: VOLTAREN Take 1 tablet (50 mg total) by mouth 3 (three) times daily.   DULoxetine 60 MG capsule Commonly known as: CYMBALTA TAKE 1 CAPSULE (60 MG TOTAL) BY MOUTH DAILY.   Easy  Touch Pen Needles 31G X 5 MM Misc Generic drug: Insulin Pen Needle Inject 1 application as directed 3 (three) times daily.   fluconazole 200 MG tablet Commonly known as: DIFLUCAN Take 1 tablet (200 mg total) by mouth daily for 7 days. Start taking on: December 11, 2020 What changed:   medication strength  how much to take   fluticasone-salmeterol 115-21 MCG/ACT inhaler Commonly known as: ADVAIR HFA Inhale 1 puff into the lungs 2 (two) times daily. What changed: Another medication with the same name was changed. Make sure you understand how and when to take each.   Advair Diskus 250-50 MCG/DOSE Aepb Generic drug: Fluticasone-Salmeterol INHALE 1 PUFF INTO THE LUNGS 2 TIMES DAILY. What changed: See the new instructions.   freestyle lancets Use as instructed   FREESTYLE LITE test strip Generic drug: glucose blood USE AS INSTRUCTED   glucose blood test strip USE AS INSTRUCTED   glucose blood test strip USE AS INSTRUCTED   gabapentin 300 MG capsule Commonly known as:  NEURONTIN Take 2 capsules (600 mg total) by mouth 3 (three) times daily.   ibuprofen 400 MG tablet Commonly known as: ADVIL Take 1 tablet (400 mg total) by mouth 3 (three) times daily.   Insulin Glargine-yfgn 100 UNIT/ML Sopn Inject 18 Units into the skin in the morning.   insulin lispro 100 UNIT/ML KwikPen Commonly known as: HumaLOG KwikPen Inject 6-10 Units into the skin 3 (three) times daily.   Januvia 100 MG tablet Generic drug: sitaGLIPtin TAKE 1 TABLET (100 MG TOTAL) BY MOUTH DAILY.   Jardiance 25 MG Tabs tablet Generic drug: empagliflozin TAKE 1 TABLET BY MOUTH DAILY.   metFORMIN 1000 MG tablet Commonly known as: GLUCOPHAGE TAKE 1 TABLET (1,000 MG TOTAL) BY MOUTH 2 (TWO) TIMES DAILY WITH A MEAL.   mirtazapine 30 MG tablet Commonly known as: REMERON Take 1 tablet (30 mg total) by mouth at bedtime.   montelukast 10 MG tablet Commonly known as: SINGULAIR TAKE 1 TABLET (10 MG TOTAL) BY MOUTH AT BEDTIME. What changed: when to take this   oxyCODONE-acetaminophen 10-325 MG tablet Commonly known as: PERCOCET Take 1 tablet by mouth every 6 (six) hours as needed for up to 15 days for pain (for breakthrough pain).   PANCRELIPASE (LIP-PROT-AMYL) PO Take 36,000 Units by mouth with breakfast, with lunch, and with evening meal.   pantoprazole 40 MG tablet Commonly known as: PROTONIX Take 1 tablet (40 mg total) by mouth 2 (two) times daily.   propranolol ER 80 MG 24 hr capsule Commonly known as: Inderal LA Take 1 capsule (80 mg total) by mouth daily.   sucralfate 1 GM/10ML suspension Commonly known as: Carafate Take 10 mLs (1 g total) by mouth 4 (four) times daily.            Discharge Care Instructions  (From admission, onward)         Start     Ordered   12/10/20 0000  Change dressing on IV access line weekly and PRN  (Home infusion instructions - [facility] )        12/10/20 1654          Discharge Instructions: Please refer to Patient  Instructions section of EMR for full details.  Patient was counseled important signs and symptoms that should prompt return to medical care, changes in medications, dietary instructions, activity restrictions, and follow up appointments.   Follow-Up Appointments:  Follow-up Information    Levin Erp, PA Follow up on 01/01/2021.  Specialty: Gastroenterology Why: 9 AM follow up w PA at GI office.   Contact information: [address] [place] [address] [phone]        Danna Hefty, DO. Go on 12/23/2020.   Specialty: Family Medicine Why: Please arrive at 1:15 Pm for a 1:30 PM appt Contact information: [address] [place] [address] [place], Lavell Islam, MD. Go on 12/20/2020.   Specialty: Infectious Diseases Why: 10:30 AM appt Contact information: [address] [address] [place], [place], DO 12/10/2020, 6:21 PM PGY-1, [facility]

## 2020-12-09 NOTE — Progress Notes (Signed)
[facility] Infectious Diseases Follow Up Note  Patient Identification: Patient Name: Christopher Fernandez MRN: [id number] Admit Date: 11/29/2020  4:24 PM Age: 50 y.o.Today's Date: 12/09/2020   Reason for Visit: Discitis and osteomyelitis   Principal Problem:   Osteomyelitis ([place]) Active Problems:   Uncontrolled diabetes mellitus with hyperglycemia, with long-term current use of insulin (HCC)   Asthma, intermittent   Diabetic foot infection ([place])   MRSA bacteremia   Right hip pain   Infective myositis of right lower extremity   Acute osteomyelitis of right pelvic region and thigh (HCC)   Discitis of thoracolumbar region   Pericardial effusion   Thrush   Bacteremia   Abdominal pain   Choledocholithiasis   Biliary sludge   Antibiotics: Vancomycin 3/19-3/22, daptomycin 3/22- c  Fluconazole 3/25- c  Lines/Tubes: Rt basilic PICC line   Interval Events: afebrile, no leukocytosis    Assessment MRSA bacteremia  T9-T10 Discitis and Osteomyelitis  MRI rt hip showing findings concerning for early Osteomyelitis of RT superior pubic ramus and myositis of rt adductor minimus - complains of pain at the rt medial thigh which seems to be fluctuating/stable. 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 OR cultures NG Osteomyelitis of the head and neck of the 1st metatarsal along with medial and lateral sesamoids.  Transaminitis   Recommendations Continue Daptomycin, CPK 46 Will need 6 to 8 weeks of antibiotics.  Monitor worsening of rt medial thigh pain   ESR and CRP ( ordered) Follow LFTS. GI following for pancreatitis/choledocholithiasis.  Fu with Dr Tommy Medal has already been made.  Rest of the management as per the primary team. Thank you for the consult. Please page with pertinent questions or  concerns.  ______________________________________________________________________ Subjective patient seen and examined at the bedside. Complains of pain at the lower back and rt medial thigh. Deneis nausea/vomiting/diarrhea and abdominal pain    Vitals BP (!) 146/97   Pulse 63   Temp 97.7 F (36.5 C) (Oral)   Resp 16   Ht '6\' 3"'  (1.905 m)   Wt 115.3 kg   SpO2 99%   BMI 31.77 kg/m     Physical Exam Constitutional:      Comments: Not in acute distress, on oxygen   Cardiovascular:     Rate and Rhythm: Normal rate and regular rhythm.     Heart sounds: No murmur heard.   Pulmonary:     Effort: Pulmonary effort is normal.     Comments:   Abdominal:     Palpations: Abdomen is soft.     Tenderness: non tender   Musculoskeletal:        General: Left foot is wrapped, Rt foot no wounds   Skin:    Comments: no obvious rashes   Neurological:     General: No focal deficit present.   Psychiatric:        Mood and Affect: Mood normal.    Pertinent Microbiology Results for orders placed or performed during the hospital encounter of 11/29/20  Culture, blood (routine x 2)     Status: Abnormal   Collection Time: 11/29/20  7:37 PM   Specimen: BLOOD LEFT ARM  Result Value Ref Range Status   Specimen Description BLOOD LEFT ARM  Final   Special Requests   Final    BOTTLES DRAWN AEROBIC ONLY Blood Culture adequate volume   Culture  Setup Time   Final    GRAM POSITIVE COCCI  IN CLUSTERS AEROBIC BOTTLE ONLY CRITICAL RESULT CALLED TO, READ BACK BY AND VERIFIED WITH: L,CHEN PHARMD '@1953'  11/30/20 EB Performed at [facility] [address], [name], [place] [address]    Culture METHICILLIN RESISTANT STAPHYLOCOCCUS AUREUS (A)  Final   Report Status 12/02/2020 FINAL  Final   Organism ID, Bacteria METHICILLIN RESISTANT STAPHYLOCOCCUS AUREUS  Final      Susceptibility   Methicillin resistant staphylococcus aureus - MIC*    CIPROFLOXACIN <=0.5 SENSITIVE Sensitive      ERYTHROMYCIN >=8 RESISTANT Resistant     GENTAMICIN <=0.5 SENSITIVE Sensitive     OXACILLIN >=4 RESISTANT Resistant     TETRACYCLINE 2 SENSITIVE Sensitive     VANCOMYCIN 1 SENSITIVE Sensitive     TRIMETH/SULFA <=10 SENSITIVE Sensitive     CLINDAMYCIN <=0.25 SENSITIVE Sensitive     RIFAMPIN <=0.5 SENSITIVE Sensitive     Inducible Clindamycin NEGATIVE Sensitive     * METHICILLIN RESISTANT STAPHYLOCOCCUS AUREUS  Culture, blood (routine x 2)     Status: Abnormal   Collection Time: 11/29/20  7:37 PM   Specimen: BLOOD LEFT ARM  Result Value Ref Range Status   Specimen Description BLOOD LEFT ARM  Final   Special Requests   Final    BOTTLES DRAWN AEROBIC ONLY Blood Culture adequate volume   Culture  Setup Time   Final    GRAM POSITIVE COCCI IN CLUSTERS AEROBIC BOTTLE ONLY CRITICAL VALUE NOTED.  VALUE IS CONSISTENT WITH PREVIOUSLY REPORTED AND CALLED VALUE.    Culture (A)  Final    STAPHYLOCOCCUS AUREUS SUSCEPTIBILITIES PERFORMED ON PREVIOUS CULTURE WITHIN THE LAST 5 DAYS. Performed at [facility], [place] [address] [place], [place] [address]    Report Status 12/02/2020 FINAL  Final  Blood Culture ID Panel (Reflexed)     Status: Abnormal   Collection Time: 11/29/20  7:37 PM  Result Value Ref Range Status   Enterococcus faecalis NOT DETECTED NOT DETECTED Final   Enterococcus Faecium NOT DETECTED NOT DETECTED Final   Listeria monocytogenes NOT DETECTED NOT DETECTED Final   Staphylococcus species DETECTED (A) NOT DETECTED Final    Comment: CRITICAL RESULT CALLED TO, READ BACK BY AND VERIFIED WITH: L,CHEN PHARMD '@1953'  11/30/20 EB    Staphylococcus aureus (BCID) DETECTED (A) NOT DETECTED Final    Comment: Methicillin (oxacillin)-resistant Staphylococcus aureus (MRSA). MRSA is predictably resistant to beta-lactam antibiotics (except ceftaroline). Preferred therapy is vancomycin unless clinically contraindicated. Patient requires contact precautions if  hospitalized. CRITICAL RESULT  CALLED TO, READ BACK BY AND VERIFIED WITH: L,CHEN PHARMD '@1953'  11/30/20 EB    Staphylococcus epidermidis NOT DETECTED NOT DETECTED Final   Staphylococcus lugdunensis NOT DETECTED NOT DETECTED Final   Streptococcus species NOT DETECTED NOT DETECTED Final   Streptococcus agalactiae NOT DETECTED NOT DETECTED Final   Streptococcus pneumoniae NOT DETECTED NOT DETECTED Final   Streptococcus pyogenes NOT DETECTED NOT DETECTED Final   A.calcoaceticus-baumannii NOT DETECTED NOT DETECTED Final   Bacteroides fragilis NOT DETECTED NOT DETECTED Final   Enterobacterales NOT DETECTED NOT DETECTED Final   Enterobacter cloacae complex NOT DETECTED NOT DETECTED Final   Escherichia coli NOT DETECTED NOT DETECTED Final   Klebsiella aerogenes NOT DETECTED NOT DETECTED Final   Klebsiella oxytoca NOT DETECTED NOT DETECTED Final   Klebsiella pneumoniae NOT DETECTED NOT DETECTED Final   Proteus species NOT DETECTED NOT DETECTED Final   Salmonella species NOT DETECTED NOT DETECTED Final   Serratia marcescens NOT DETECTED NOT DETECTED Final   Haemophilus influenzae NOT  DETECTED NOT DETECTED Final   Neisseria meningitidis NOT DETECTED NOT DETECTED Final   Pseudomonas aeruginosa NOT DETECTED NOT DETECTED Final   Stenotrophomonas maltophilia NOT DETECTED NOT DETECTED Final   Candida albicans NOT DETECTED NOT DETECTED Final   Candida auris NOT DETECTED NOT DETECTED Final   Candida glabrata NOT DETECTED NOT DETECTED Final   Candida krusei NOT DETECTED NOT DETECTED Final   Candida parapsilosis NOT DETECTED NOT DETECTED Final   Candida tropicalis NOT DETECTED NOT DETECTED Final   Cryptococcus neoformans/gattii NOT DETECTED NOT DETECTED Final   Meth resistant mecA/C and MREJ DETECTED (A) NOT DETECTED Final    Comment: CRITICAL RESULT CALLED TO, READ BACK BY AND VERIFIED WITH: L,CHEN PHARMD '@1953'  11/30/20 EB Performed at [facility], [address] [place], [place] [address]   Culture, Urine     Status: None    Collection Time: 11/29/20  9:25 PM   Specimen: Urine, Random  Result Value Ref Range Status   Specimen Description URINE, RANDOM  Final   Special Requests NONE  Final   Culture   Final    NO GROWTH Performed at [facility], [place] [address], [place], [place] [address]    Report Status 12/01/2020 FINAL  Final  Culture, blood (routine x 2)     Status: None   Collection Time: 12/01/20 10:16 AM   Specimen: BLOOD LEFT HAND  Result Value Ref Range Status   Specimen Description BLOOD LEFT HAND  Final   Special Requests   Final    BOTTLES DRAWN AEROBIC AND ANAEROBIC Blood Culture results may not be optimal due to an inadequate volume of blood received in culture bottles   Culture   Final    NO GROWTH 5 DAYS Performed at [facility], [place] [address] [place], [place] [address]    Report Status 12/06/2020 FINAL  Final  Culture, blood (routine x 2)     Status: Abnormal   Collection Time: 12/01/20 10:23 AM   Specimen: BLOOD RIGHT HAND  Result Value Ref Range Status   Specimen Description BLOOD RIGHT HAND  Final   Special Requests   Final    BOTTLES DRAWN AEROBIC AND ANAEROBIC Blood Culture adequate volume   Culture  Setup Time   Final    ANAEROBIC BOTTLE ONLY GRAM POSITIVE COCCI CRITICAL VALUE NOTED.  VALUE IS CONSISTENT WITH PREVIOUSLY REPORTED AND CALLED VALUE.    Culture (A)  Final    STAPHYLOCOCCUS AUREUS SUSCEPTIBILITIES PERFORMED ON PREVIOUS CULTURE WITHIN THE LAST 5 DAYS. Performed at [facility], [place] [address], [place], [place] [address]    Report Status 12/05/2020 FINAL  Final  Surgical pcr screen     Status: Abnormal   Collection Time: 12/01/20 10:15 PM   Specimen: Nasal Mucosa; Nasal Swab  Result Value Ref Range Status   MRSA, PCR POSITIVE (A) NEGATIVE Final    Comment: RESULT CALLED TO, READ BACK BY AND VERIFIED WITH: ROGERS,P RN 0001 12/02/2020 MITCHELL,L    Staphylococcus aureus POSITIVE (A) NEGATIVE Final    Comment: (NOTE) The Xpert SA Assay  (FDA approved for NASAL specimens in patients 15 years of age and older), is one component of a comprehensive surveillance program. It is not intended to diagnose infection nor to guide or monitor treatment. Performed at [facility], [place] [address], [place],  [address]   Resp Panel by RT-PCR (Flu A&B, Covid) Nasopharyngeal Swab     Status: None   Collection  Time: 12/02/20  8:15 AM   Specimen: Nasopharyngeal Swab; Nasopharyngeal(NP) swabs in vial transport medium  Result Value Ref Range Status   SARS Coronavirus 2 by RT PCR NEGATIVE NEGATIVE Final    Comment: (NOTE) SARS-CoV-2 target nucleic acids are NOT DETECTED.  The SARS-CoV-2 RNA is generally detectable in upper respiratory specimens during the acute phase of infection. The lowest concentration of SARS-CoV-2 viral copies this assay can detect is 138 copies/mL. A negative result does not preclude SARS-Cov-2 infection and should not be used as the sole basis for treatment or other patient management decisions. A negative result may occur with  improper specimen collection/handling, submission of specimen other than nasopharyngeal swab, presence of viral mutation(s) within the areas targeted by this assay, and inadequate number of viral copies(<138 copies/mL). A negative result must be combined with clinical observations, patient history, and epidemiological information. The expected result is Negative.  Fact Sheet for Patients:  EntrepreneurPulse.com.au  Fact Sheet for Healthcare Providers:  IncredibleEmployment.be  This test is no t yet approved or cleared by the [place] FDA and  has been authorized for detection and/or diagnosis of SARS-CoV-2 by FDA under an Emergency Use Authorization (EUA). This EUA will remain  in effect (meaning this test can be used) for the duration of the COVID-19 declaration under Section 564(b)(1) of the Act, 21 U.S.C.section 360bbb-3(b)(1),  unless the authorization is terminated  or revoked sooner.       Influenza A by PCR NEGATIVE NEGATIVE Final   Influenza B by PCR NEGATIVE NEGATIVE Final    Comment: (NOTE) The Xpert Xpress SARS-CoV-2/FLU/RSV plus assay is intended as an aid in the diagnosis of influenza from Nasopharyngeal swab specimens and should not be used as a sole basis for treatment. Nasal washings and aspirates are unacceptable for Xpert Xpress SARS-CoV-2/FLU/RSV testing.  Fact Sheet for Patients: EntrepreneurPulse.com.au  Fact Sheet for Healthcare Providers: IncredibleEmployment.be  This test is not yet approved or cleared by the [place] FDA and has been authorized for detection and/or diagnosis of SARS-CoV-2 by FDA under an Emergency Use Authorization (EUA). This EUA will remain in effect (meaning this test can be used) for the duration of the COVID-19 declaration under Section 564(b)(1) of the Act, 21 U.S.C. section 360bbb-3(b)(1), unless the authorization is terminated or revoked.  Performed at [facility], [place] [address] [place], [place] [address]   Aerobic/Anaerobic Culture w Gram Stain (surgical/deep wound)     Status: None   Collection Time: 12/02/20  4:34 PM   Specimen: PATH Digit amputation; Tissue  Result Value Ref Range Status   Specimen Description TISSUE LEFT TOE  Final   Special Requests PATIENT ON FOLLOWING CEFEPIME MAXIPIME VANC  Final   Gram Stain   Final    RARE WBC PRESENT, PREDOMINANTLY PMN NO ORGANISMS SEEN    Culture   Final    No growth aerobically or anaerobically. Performed at [facility], [name] [address], [place], [place] [address]    Report Status 12/07/2020 FINAL  Final  Culture, blood (routine x 2)     Status: None   Collection Time: 12/04/20 12:14 PM   Specimen: BLOOD  Result Value Ref Range Status   Specimen Description BLOOD LEFT ANTECUBITAL  Final   Special Requests   Final    BOTTLES DRAWN AEROBIC AND  ANAEROBIC Blood Culture adequate volume   Culture   Final    NO GROWTH 5 DAYS Performed at [facility], [place],  [place] [address]    Report Status 12/09/2020 FINAL  Final  Culture, blood (routine x 2)     Status: None   Collection Time: 12/04/20 12:14 PM   Specimen: BLOOD LEFT FOREARM  Result Value Ref Range Status   Specimen Description BLOOD LEFT FOREARM  Final   Special Requests   Final    BOTTLES DRAWN AEROBIC AND ANAEROBIC Blood Culture adequate volume   Culture   Final    NO GROWTH 5 DAYS Performed at [facility], [place] [address], [place], [place] [address]    Report Status 12/09/2020 FINAL  Final      Pertinent Lab. CBC Latest Ref Rng & Units 12/09/2020 12/07/2020 12/05/2020  WBC 4.0 - 10.5 K/uL 7.9 9.5 7.3  Hemoglobin 13.0 - 17.0 g/dL 9.4(L) 10.8(L) 10.4(L)  Hematocrit 39.0 - 52.0 % 29.2(L) 32.6(L) 31.3(L)  Platelets 150 - 400 K/uL 553(H) 598(H) 509(H)   CMP Latest Ref Rng & Units 12/09/2020 12/08/2020 12/07/2020  Glucose 70 - 99 mg/dL 196(H) 257(H) 449(H)  BUN 6 - 20 mg/dL '14 11 12  ' Creatinine 0.61 - 1.24 mg/dL 0.78 0.80 0.93  Sodium 135 - 145 mmol/L 136 135 132(L)  Potassium 3.5 - 5.1 mmol/L 4.0 4.4 4.7  Chloride 98 - 111 mmol/L 100 99 97(L)  CO2 22 - 32 mmol/L '30 29 26  ' Calcium 8.9 - 10.3 mg/dL 8.6(L) 8.7(L) 8.4(L)  Total Protein 6.5 - 8.1 g/dL 6.2(L) 6.6 -  Total Bilirubin 0.3 - 1.2 mg/dL 0.5 0.4 -  Alkaline Phos 38 - 126 U/L 163(H) 143(H) -  AST 15 - 41 U/L 558(H) 22 -  ALT 0 - 44 U/L 269(H) 53(H) -     Pertinent Imaging today Plain films and CT images have been personally visualized and interpreted; radiology reports have been reviewed. Decision making incorporated into the Impression / Recommendations.  I have spent approx 30 minutes for this patient encounter including review of prior medical records, coordination of care  with greater than 50% of time being face to face/counseling and discussing diagnostics/treatment plan with  the patient/family.  Electronically signed by:   Rosiland Oz, MD Infectious Disease Physician [facility] for Infectious Disease Pager: [phone]

## 2020-12-09 NOTE — Progress Notes (Signed)
Physical Therapy Treatment Patient Details Name: Christopher Fernandez MRN: [id number] DOB: 08-28-1971 Today's Date: 12/09/2020    History of Present Illness Patient is a 50 y/o male who presented directly from [facility] clinic with back pain on 3/18 with concern for osteomyelitis T9-T10. MRI + for T9-T10 osteomyelitis. s/p L second toe amputation secondary to osteomyelitis on 3/21. Pt now presents with Choledocholithiasis and Elevated LFTs. s/p ERCP on 12/07/19. PMH: chronic pancreatitis, R transmet amputation, alcohol use dependence, anxiety, GERD, diabetic neuropathy, BPH, HTN, DM    PT Comments    Pt unable to tolerate ambulation today due to low back pain. Pt gave a good effort but ultimately unable to tolerate. Worked on sidelying positioning with pillow between knees and ice on the back in effort to minimize back pain. Pt was using heat but not finding much relief after the hot pack became warm. Acute PT to cont to follow.    Follow Up Recommendations  No PT follow up     Equipment Recommendations  None recommended by PT    Recommendations for Other Services       Precautions / Restrictions Precautions Precautions: Fall Required Braces or Orthoses: Other Brace Other Brace: darco shoe, WBAT with shoe donned per Dr. Nona Dell office (per PT, called 9:20 am on 3/22 to verify) Restrictions Weight Bearing Restrictions: Yes LLE Weight Bearing: Weight bearing as tolerated    Mobility  Bed Mobility Overal bed mobility: Modified Independent Bed Mobility: Rolling;Sidelying to Sit;Sit to Sidelying Rolling: Modified independent (Device/Increase time) Sidelying to sit: Modified independent (Device/Increase time) Supine to sit: Independent   Sit to sidelying: Modified independent (Device/Increase time) General bed mobility comments: increased time, use of bed rail    Transfers Overall transfer level: Needs assistance Equipment used: Quad cane Transfers: Sit to/from Stand Sit to Stand:  Supervision         General transfer comment: pt with safe and good use of quad cane  Ambulation/Gait Ambulation/Gait assistance: Supervision Gait Distance (Feet): 5 Feet Assistive device: Quad cane Gait Pattern/deviations: Step-through pattern;Decreased stride length     General Gait Details: pt unable to tolerate amb today due to back pain, during the short time pt did demo good walking technique with darco shoe however pt ultimately couldn't tolerate due to back pain   Stairs             Wheelchair Mobility    Modified Rankin (Stroke Patients Only)       Balance Overall balance assessment: Mild deficits observed, not formally tested                                          Cognition Arousal/Alertness: Awake/alert Behavior During Therapy: WFL for tasks assessed/performed Overall Cognitive Status: Within Functional Limits for tasks assessed                                        Exercises      General Comments General comments (skin integrity, edema, etc.): VSS, assisted pt into R sidelying with ice on the back and pillow between knees in efforts to minimize pain until pt could get his pain medicine. pt was using heat, pt willing to try ice and was left comfortable      Pertinent Vitals/Pain Pain Assessment: 0-10 Pain Score: 8  Pain Location: low back Pain Descriptors / Indicators: Constant;Grimacing;Guarding Pain Intervention(s): Limited activity within patient's tolerance    Home Living                      Prior Function            PT Goals (current goals can now be found in the care plan section) Acute Rehab PT Goals Patient Stated Goal: pain management Progress towards PT goals: Progressing toward goals    Frequency    Min 3X/week      PT Plan Frequency needs to be updated    Co-evaluation              AM-PAC PT "6 Clicks" Mobility   Outcome Measure  Help needed turning from your  back to your side while in a flat bed without using bedrails?: None Help needed moving from lying on your back to sitting on the side of a flat bed without using bedrails?: None Help needed moving to and from a bed to a chair (including a wheelchair)?: None Help needed standing up from a chair using your arms (e.g., wheelchair or bedside chair)?: None Help needed to walk in hospital room?: A Little Help needed climbing 3-5 steps with a railing? : A Little 6 Click Score: 22    End of Session Equipment Utilized During Treatment: Gait belt Activity Tolerance: Patient limited by pain Patient left: in bed;with call bell/phone within reach;with family/visitor present Nurse Communication: Mobility status PT Visit Diagnosis: Unsteadiness on feet (R26.81);Muscle weakness (generalized) (M62.81)     Time: 1331-1350 PT Time Calculation (min) (ACUTE ONLY): 19 min  Charges:  $Gait Training: 8-22 mins                     Kittie Plater, PT, DPT Acute Rehabilitation Services Pager #: [phone] Office #: [phone]    Berline Lopes 12/09/2020, 2:25 PM

## 2020-12-09 NOTE — Progress Notes (Signed)
Family Medicine Teaching Service Daily Progress Note Intern Pager: [phone]  Patient name: Christopher Fernandez Medical record number: [id number] Date of birth: September 20, 1970 Age: 50 y.o. 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, also left foot 2nd digit osteomyelitis with abscess 3/21: Amputation of left 2nd toe with resection of metatarsal head  3/25: ERCP: biliary sludge, severely dilated bile duct, pancreatic stent placed 3/27: PICC placed  Assessment and Plan: Christopher Fernandez a 50 y.o.male who presentedwith back pain, found to have osteomyelitis of T9/T10.Additionally now with MRSA bacteremia and concern for right hip pyomyositis.PMH is significant forcellulitis, diabetes, neuropathy, anemia, hyperlipidemia, asthma, alcohol abuse, tobacco use disorder, trauma, cholecystectomy, asplenia, anxietyanddepression.  MRSA bacteremia Osteomyelitis of T9/T10 and left 2nd toePOD #7 from amputation of 2nd digit with resection of metatarsal head  PICC placed yesterday. Patient to continue Daptomycin for 6 week total course (until 5/3). CK 46. -Continue Daptomycin -ID following, appreciate recommendations -Pain regimen: Tylenol 650 mg q6h  Gabapentin 600 mg TID Lidocaine patch qd Oxycodone IR 10 mg q4h PRN severe, breakthrough pain             Hold Ibuprofen given increase in LFT's  Baclofen QHS -Continue PT/OT  Right Hip Pain  MRI hip today.  -F/u MRI  -ID following, appreciate recommendations  Elevated LFT's Abdominal Pain  Hx Biliary Sludge  Hx Chronic Pancreatitis  AST/ALT significantly increased this AM. AST 22>558, ALT 53>269. Alk Phos 143>163.  -GI consulted; recommend repeat ERCP in 1 week, will follow outpatient. -Hepatitis  panel in AM -ESR, CRP tomorrow AM -Repeat CMP tomorrow -Monitor pain  T2DM with Neuropathy CBG's ranging 193-269. Requiring 16U SSI. -Continue Lantus 18U -mSSI with HS coverage -Continue Gabapentin   Esophageal Candidiasis -Continue Fluconazole 200 mg until 4/6  Thrombocytosis Throughout this entire hospitalization, have been in 400-500 range throughout admission. -Monitor with CBC  Mild Constipation -MiraLAX daily PRN -Colace BID  Pancreatic insufficiencychronic pancreatitis -Continue Creon 36, 000 units with meals  Hyperlipidemia: Chronic, stable -Holding home atorvastatin -Restart statin on d/c  Hx asthma: Chronic, stable - Dulera, per formulary - Albuterol PRN  Hx alcohol abuse  Tobacco use disorder -Encourage smoking cessation  Hx Tremor: chronic, stable - Continue propranolol 80 mg daily  Hx Asplenia, Cholecystectomy: chronic, stable -Consider Meningococal vaccine as outpatient  Anxiety with Depression: Chronic, stable - Continue homearipirazole, cymbalta, and mirtazapine  FEN/GI: Carb modified diet, protonix PPx: lovenox   Status is: Inpatient  Remains inpatient appropriate because:Ongoing diagnostic testing needed not appropriate for outpatient work up and Inpatient level of care appropriate due to severity of illness   Dispo:  Patient From: Home  Planned Disposition: Home  Medically stable for discharge: No     Subjective:  Christopher Fernandez feels that his abdominal pain is improved today.  Denies any nausea, vomiting, difficulties eating.  He continues to have pain in his back though the pain is stable and not worsening.  He is perplexed at his elevated liver enzymes this morning, states that his intermittent abdominal pain does not feel like pancreatitis.  Objective: Temp:  [97.5 F (36.4 C)-99.1 F (37.3 C)] 99.1 F (37.3 C) (03/28 0607) Pulse Rate:  [56-80] 56 (03/28 0607) Resp:  [16-18] 18 (03/28 0607) BP: (116-150)/(74-101)  150/101 (03/28 0607) SpO2:  [95 %-100 %] 100 % (03/28 0017) Physical  Exam: General: Awake, alert, in no distress Cardiovascular: RRR, no murmur Respiratory: scant expiratory wheezing b/l bases, good air movement, no crackles or rhonochi Abdomen: Mild tenderness to RUQ, RLQ, LLQ without rebound or guarding, soft, non-distended Extremities: No edema, 2+ radial and DP pulses b/l   Laboratory: Recent Labs  Lab 12/05/20 0500 12/07/20 0237 12/09/20 0320  WBC 7.3 9.5 7.9  HGB 10.4* 10.8* 9.4*  HCT 31.3* 32.6* 29.2*  PLT 509* 598* 553*   Recent Labs  Lab 12/07/20 0237 12/07/20 1235 12/08/20 0130 12/09/20 0320  NA 131* 132* 135 136  K 5.6* 4.7 4.4 4.0  CL 97* 97* 99 100  CO2 '25 26 29 30  ' BUN '16 12 11 14  ' CREATININE 0.89 0.93 0.80 0.78  CALCIUM 8.4* 8.4* 8.7* 8.6*  PROT 6.7  --  6.6 6.2*  BILITOT 0.6  --  0.4 0.5  ALKPHOS 153*  --  143* 163*  ALT 65*  --  53* 269*  AST 28  --  22 558*  GLUCOSE 418* 449* 257* 196*     Imaging/Diagnostic Tests: None new.   Sharion Settler, DO 12/09/2020, 6:30 AM PGY-1, [place] Intern pager: [phone], text pages welcome

## 2020-12-10 ENCOUNTER — Encounter: Payer: Self-pay | Admitting: Gastroenterology

## 2020-12-10 ENCOUNTER — Other Ambulatory Visit (HOSPITAL_COMMUNITY): Payer: Self-pay | Admitting: Family Medicine

## 2020-12-10 ENCOUNTER — Inpatient Hospital Stay (HOSPITAL_COMMUNITY): Payer: 59

## 2020-12-10 ENCOUNTER — Ambulatory Visit: Payer: 59 | Admitting: Pharmacist

## 2020-12-10 LAB — HEPATIC FUNCTION PANEL
ALT: 259 U/L — ABNORMAL HIGH (ref 0–44)
AST: 161 U/L — ABNORMAL HIGH (ref 15–41)
Albumin: 2.3 g/dL — ABNORMAL LOW (ref 3.5–5.0)
Alkaline Phosphatase: 184 U/L — ABNORMAL HIGH (ref 38–126)
Bilirubin, Direct: <0.1 mg/dL (ref 0.0–0.2)
Total Bilirubin: 0.4 mg/dL (ref 0.3–1.2)
Total Protein: 6.1 g/dL — ABNORMAL LOW (ref 6.5–8.1)

## 2020-12-10 LAB — SURGICAL PATHOLOGY

## 2020-12-10 LAB — GLUCOSE, CAPILLARY
Glucose-Capillary: 163 mg/dL — ABNORMAL HIGH (ref 70–99)
Glucose-Capillary: 186 mg/dL — ABNORMAL HIGH (ref 70–99)
Glucose-Capillary: 142 mg/dL — ABNORMAL HIGH (ref 70–99)
Glucose-Capillary: 166 mg/dL — ABNORMAL HIGH (ref 70–99)

## 2020-12-10 LAB — C-REACTIVE PROTEIN: CRP: 8.4 mg/dL — ABNORMAL HIGH (ref <1.0)

## 2020-12-10 LAB — SEDIMENTATION RATE: Sed Rate: 106 mm/hr — ABNORMAL HIGH (ref 0–16)

## 2020-12-10 MED ORDER — IBUPROFEN 400 MG PO TABS: 400.0000 mg | ORAL_TABLET | Freq: Three times a day (TID) | ORAL | 0 refills | Status: DC

## 2020-12-10 MED ORDER — HEPARIN SOD (PORK) LOCK FLUSH 100 UNIT/ML IV SOLN
250.0000 [IU] | INTRAVENOUS | Status: AC | PRN
  Administered 2020-12-10: 250 [IU]
  Filled 2020-12-10: qty 2.5

## 2020-12-10 MED ORDER — GADOBUTROL 1 MMOL/ML IV SOLN
10.0000 mL | Freq: Once | INTRAVENOUS | Status: AC | PRN
  Administered 2020-12-10: 10 mL via INTRAVENOUS

## 2020-12-10 MED ORDER — DAPTOMYCIN IV (FOR PTA / DISCHARGE USE ONLY): 1000.0000 mg | INTRAVENOUS | 0 refills | Status: DC

## 2020-12-10 MED ORDER — FLUCONAZOLE 200 MG PO TABS: 200.0000 mg | ORAL_TABLET | Freq: Every day | ORAL | 0 refills | Status: DC

## 2020-12-10 MED ORDER — OXYCODONE-ACETAMINOPHEN 10-325 MG PO TABS: 1.0000 | ORAL_TABLET | Freq: Four times a day (QID) | ORAL | 0 refills | Status: DC | PRN

## 2020-12-10 MED FILL — FLUCONAZOLE 200 MG TABLET: 200 | 7 days supply | Qty: 7 | Fill #0

## 2020-12-10 MED FILL — OXYCODONE-APAP 10-325: 10-325 | 15 days supply | Qty: 15 | Fill #0

## 2020-12-10 MED FILL — IBUPROFEN 400 MG TABS: 400 | 10 days supply | Qty: 30 | Fill #0

## 2020-12-10 NOTE — Progress Notes (Signed)
[facility] Infectious Diseases Follow Up Note  Patient Identification: Patient Name: Christopher Fernandez MRN: [id number] Admit Date: 11/29/2020  4:24 PM Age: 50 y.o.Today's Date: 12/10/2020   Reason for Visit: MRSA bacteremia, right medial thigh pain  Principal Problem:   Osteomyelitis ([place]) Active Problems:   Uncontrolled diabetes mellitus with hyperglycemia, with long-term current use of insulin (HCC)   Asthma, intermittent   Diabetic foot infection ([place])   MRSA bacteremia   Right hip pain   Infective myositis of right lower extremity   Acute osteomyelitis of right pelvic region and thigh (HCC)   Discitis of thoracolumbar region   Pericardial effusion   Thrush   Bacteremia   Abdominal pain   Choledocholithiasis   Biliary sludge  Antibiotics: Vancomycin 3/19-3/22, daptomycin 3/22- c  Fluconazole 3/25- c  Lines/Tubes: Rt basilic PICC line    Interval Events: Afebrile, no leukocytosis, MRI right hip pending   Assessment MRSA bacteremia T9-T10 discitis and osteomyelitis Osteomyelitis of right superior pubic ramus Myositis of right abductor minimus DFU/left second toe osteomyelitis status post amputation of the second toes with metatarsal head resection.  OR cultures no growth Osteomyelitis of the head and neck of the first metatarsal along with medial and lateral sesamoids Transaminitis- downtrending   Recommendations Continue daptomycin, monitor CPK  Plan to treat with IV antibiotics for 6 to 8 weeks  A follow-up has been made with Dr. Tommy Medal already  CBC, BMP, CPK, ESR and CRP weekly Will sign off for now  Diagnosis: discitis and osteomyelitis   Culture Result: MRSA  Allergies  Allergen Reactions  . Eggs Or Egg-Derived Products Rash  . Morphine And Related Other (See Comments)    Cant take because of pancreatitis  . Cocoa Rash    OPAT Orders Discharge antibiotics to be given via PICC line Discharge  antibiotics: Daptomycin  Per pharmacy protocol  Duration: 6 weeks  End Date: 01/15/21  [facility] Care Per Protocol:  Home health RN for IV administration and teaching; PICC line care and labs.    Labs weekly while on IV antibiotics: X__ CBC with differential X__ BMP __ CMP X__ CRP X__ ESR __ Vancomycin trough X__ CK  __ Please pull PIC at completion of IV antibiotics X__ Please leave PIC in place until doctor has seen patient or been notified  Fax weekly labs to [phone]  Clinic Follow Up Appt: 12/20/20   @ 10:30 am with Dr Tommy Medal   Rest of the management as per the primary team. Thank you for the consult. Please page with pertinent questions or concerns.  ______________________________________________________________________ Subjective patient seen and examined at the bedside. Rt medial thigh pain is improving. Denies nausea/vomiting and diarrhea    Vitals BP (!) 145/97 (BP Location: Left Arm)   Pulse (!) 59   Temp 98.4 F (36.9 C) (Oral)   Resp 15   Ht '6\' 3"'  (1.905 m)   Wt 115.3 kg   SpO2 97%   BMI 31.77 kg/m     Physical Exam Constitutional:  not in acute distress    Comments:   Cardiovascular:     Rate and Rhythm: Normal rate and regular rhythm.     Heart sounds:   Pulmonary:     Effort: Pulmonary effort is normal.     Comments:   Abdominal:     Palpations: Abdomen is soft.     Tenderness:   Musculoskeletal:        General: RT medial thigh minimal tenderness+  Skin:  Comments: no obvious rashes   Neurological:     General: No focal deficit present.   Psychiatric:        Mood and Affect: Mood normal.   Pertinent Microbiology Results for orders placed or performed during the hospital encounter of 11/29/20  Culture, blood (routine x 2)     Status: Abnormal   Collection Time: 11/29/20  7:37 PM   Specimen: BLOOD LEFT ARM  Result Value Ref Range Status   Specimen Description BLOOD LEFT ARM  Final   Special Requests   Final    BOTTLES  DRAWN AEROBIC ONLY Blood Culture adequate volume   Culture  Setup Time   Final    GRAM POSITIVE COCCI IN CLUSTERS AEROBIC BOTTLE ONLY CRITICAL RESULT CALLED TO, READ BACK BY AND VERIFIED WITH: L,CHEN PHARMD '@1953'  11/30/20 EB Performed at [facility], [address] [place], [place] [address]    Culture METHICILLIN RESISTANT STAPHYLOCOCCUS AUREUS (A)  Final   Report Status 12/02/2020 FINAL  Final   Organism ID, Bacteria METHICILLIN RESISTANT STAPHYLOCOCCUS AUREUS  Final      Susceptibility   Methicillin resistant staphylococcus aureus - MIC*    CIPROFLOXACIN <=0.5 SENSITIVE Sensitive     ERYTHROMYCIN >=8 RESISTANT Resistant     GENTAMICIN <=0.5 SENSITIVE Sensitive     OXACILLIN >=4 RESISTANT Resistant     TETRACYCLINE 2 SENSITIVE Sensitive     VANCOMYCIN 1 SENSITIVE Sensitive     TRIMETH/SULFA <=10 SENSITIVE Sensitive     CLINDAMYCIN <=0.25 SENSITIVE Sensitive     RIFAMPIN <=0.5 SENSITIVE Sensitive     Inducible Clindamycin NEGATIVE Sensitive     * METHICILLIN RESISTANT STAPHYLOCOCCUS AUREUS  Culture, blood (routine x 2)     Status: Abnormal   Collection Time: 11/29/20  7:37 PM   Specimen: BLOOD LEFT ARM  Result Value Ref Range Status   Specimen Description BLOOD LEFT ARM  Final   Special Requests   Final    BOTTLES DRAWN AEROBIC ONLY Blood Culture adequate volume   Culture  Setup Time   Final    GRAM POSITIVE COCCI IN CLUSTERS AEROBIC BOTTLE ONLY CRITICAL VALUE NOTED.  VALUE IS CONSISTENT WITH PREVIOUSLY REPORTED AND CALLED VALUE.    Culture (A)  Final    STAPHYLOCOCCUS AUREUS SUSCEPTIBILITIES PERFORMED ON PREVIOUS CULTURE WITHIN THE LAST 5 DAYS. Performed at [facility], [place] [address], [place],  [address]    Report Status 12/02/2020 FINAL  Final  Blood Culture ID Panel (Reflexed)     Status: Abnormal   Collection Time: 11/29/20  7:37 PM  Result Value Ref Range Status   Enterococcus faecalis NOT DETECTED NOT DETECTED Final   Enterococcus Faecium NOT  DETECTED NOT DETECTED Final   Listeria monocytogenes NOT DETECTED NOT DETECTED Final   Staphylococcus species DETECTED (A) NOT DETECTED Final    Comment: CRITICAL RESULT CALLED TO, READ BACK BY AND VERIFIED WITH: L,CHEN PHARMD '@1953'  11/30/20 EB    Staphylococcus aureus (BCID) DETECTED (A) NOT DETECTED Final    Comment: Methicillin (oxacillin)-resistant Staphylococcus aureus (MRSA). MRSA is predictably resistant to beta-lactam antibiotics (except ceftaroline). Preferred therapy is vancomycin unless clinically contraindicated. Patient requires contact precautions if  hospitalized. CRITICAL RESULT CALLED TO, READ BACK BY AND VERIFIED WITH: L,CHEN PHARMD '@1953'  11/30/20 EB    Staphylococcus epidermidis NOT DETECTED NOT DETECTED Final   Staphylococcus lugdunensis NOT DETECTED NOT DETECTED Final   Streptococcus species NOT DETECTED NOT DETECTED Final   Streptococcus agalactiae NOT DETECTED NOT DETECTED Final  Streptococcus pneumoniae NOT DETECTED NOT DETECTED Final   Streptococcus pyogenes NOT DETECTED NOT DETECTED Final   A.calcoaceticus-baumannii NOT DETECTED NOT DETECTED Final   Bacteroides fragilis NOT DETECTED NOT DETECTED Final   Enterobacterales NOT DETECTED NOT DETECTED Final   Enterobacter cloacae complex NOT DETECTED NOT DETECTED Final   Escherichia coli NOT DETECTED NOT DETECTED Final   Klebsiella aerogenes NOT DETECTED NOT DETECTED Final   Klebsiella oxytoca NOT DETECTED NOT DETECTED Final   Klebsiella pneumoniae NOT DETECTED NOT DETECTED Final   Proteus species NOT DETECTED NOT DETECTED Final   Salmonella species NOT DETECTED NOT DETECTED Final   Serratia marcescens NOT DETECTED NOT DETECTED Final   Haemophilus influenzae NOT DETECTED NOT DETECTED Final   Neisseria meningitidis NOT DETECTED NOT DETECTED Final   Pseudomonas aeruginosa NOT DETECTED NOT DETECTED Final   Stenotrophomonas maltophilia NOT DETECTED NOT DETECTED Final   Candida albicans NOT DETECTED NOT DETECTED Final    Candida auris NOT DETECTED NOT DETECTED Final   Candida glabrata NOT DETECTED NOT DETECTED Final   Candida krusei NOT DETECTED NOT DETECTED Final   Candida parapsilosis NOT DETECTED NOT DETECTED Final   Candida tropicalis NOT DETECTED NOT DETECTED Final   Cryptococcus neoformans/gattii NOT DETECTED NOT DETECTED Final   Meth resistant mecA/C and MREJ DETECTED (A) NOT DETECTED Final    Comment: CRITICAL RESULT CALLED TO, READ BACK BY AND VERIFIED WITH: L,CHEN PHARMD '@1953'  11/30/20 EB Performed at [facility], [address], [place], [place] [address]   Culture, Urine     Status: None   Collection Time: 11/29/20  9:25 PM   Specimen: Urine, Random  Result Value Ref Range Status   Specimen Description URINE, RANDOM  Final   Special Requests NONE  Final   Culture   Final    NO GROWTH Performed at [facility], [place] [address] [place], [place] [address]    Report Status 12/01/2020 FINAL  Final  Culture, blood (routine x 2)     Status: None   Collection Time: 12/01/20 10:16 AM   Specimen: BLOOD LEFT HAND  Result Value Ref Range Status   Specimen Description BLOOD LEFT HAND  Final   Special Requests   Final    BOTTLES DRAWN AEROBIC AND ANAEROBIC Blood Culture results may not be optimal due to an inadequate volume of blood received in culture bottles   Culture   Final    NO GROWTH 5 DAYS Performed at [facility], [place] [address] [place], [place] [address]    Report Status 12/06/2020 FINAL  Final  Culture, blood (routine x 2)     Status: Abnormal   Collection Time: 12/01/20 10:23 AM   Specimen: BLOOD RIGHT HAND  Result Value Ref Range Status   Specimen Description BLOOD RIGHT HAND  Final   Special Requests   Final    BOTTLES DRAWN AEROBIC AND ANAEROBIC Blood Culture adequate volume   Culture  Setup Time   Final    ANAEROBIC BOTTLE ONLY GRAM POSITIVE COCCI CRITICAL VALUE NOTED.  VALUE IS CONSISTENT WITH PREVIOUSLY REPORTED AND CALLED VALUE.    Culture (A)   Final    STAPHYLOCOCCUS AUREUS SUSCEPTIBILITIES PERFORMED ON PREVIOUS CULTURE WITHIN THE LAST 5 DAYS. Performed at [facility], [place] [address], [place], [place] [address]    Report Status 12/05/2020 FINAL  Final  Surgical pcr screen     Status: Abnormal   Collection Time: 12/01/20 10:15 PM   Specimen: Nasal Mucosa;  Nasal Swab  Result Value Ref Range Status   MRSA, PCR POSITIVE (A) NEGATIVE Final    Comment: RESULT CALLED TO, READ BACK BY AND VERIFIED WITH: ROGERS,P RN 0001 12/02/2020 MITCHELL,L    Staphylococcus aureus POSITIVE (A) NEGATIVE Final    Comment: (NOTE) The Xpert SA Assay (FDA approved for NASAL specimens in patients 62 years of age and older), is one component of a comprehensive surveillance program. It is not intended to diagnose infection nor to guide or monitor treatment. Performed at [facility], [place] [address] [place], [place] [address]   Resp Panel by RT-PCR (Flu A&B, Covid) Nasopharyngeal Swab     Status: None   Collection Time: 12/02/20  8:15 AM   Specimen: Nasopharyngeal Swab; Nasopharyngeal(NP) swabs in vial transport medium  Result Value Ref Range Status   SARS Coronavirus 2 by RT PCR NEGATIVE NEGATIVE Final    Comment: (NOTE) SARS-CoV-2 target nucleic acids are NOT DETECTED.  The SARS-CoV-2 RNA is generally detectable in upper respiratory specimens during the acute phase of infection. The lowest concentration of SARS-CoV-2 viral copies this assay can detect is 138 copies/mL. A negative result does not preclude SARS-Cov-2 infection and should not be used as the sole basis for treatment or other patient management decisions. A negative result may occur with  improper specimen collection/handling, submission of specimen other than nasopharyngeal swab, presence of viral mutation(s) within the areas targeted by this assay, and inadequate number of viral copies(<138 copies/mL). A negative result must be combined with clinical observations,  patient history, and epidemiological information. The expected result is Negative.  Fact Sheet for Patients:  EntrepreneurPulse.com.au  Fact Sheet for Healthcare Providers:  IncredibleEmployment.be  This test is no t yet approved or cleared by the [place] FDA and  has been authorized for detection and/or diagnosis of SARS-CoV-2 by FDA under an Emergency Use Authorization (EUA). This EUA will remain  in effect (meaning this test can be used) for the duration of the COVID-19 declaration under Section 564(b)(1) of the Act, 21 U.S.C.section 360bbb-3(b)(1), unless the authorization is terminated  or revoked sooner.       Influenza A by PCR NEGATIVE NEGATIVE Final   Influenza B by PCR NEGATIVE NEGATIVE Final    Comment: (NOTE) The Xpert Xpress SARS-CoV-2/FLU/RSV plus assay is intended as an aid in the diagnosis of influenza from Nasopharyngeal swab specimens and should not be used as a sole basis for treatment. Nasal washings and aspirates are unacceptable for Xpert Xpress SARS-CoV-2/FLU/RSV testing.  Fact Sheet for Patients: EntrepreneurPulse.com.au  Fact Sheet for Healthcare Providers: IncredibleEmployment.be  This test is not yet approved or cleared by the [place] FDA and has been authorized for detection and/or diagnosis of SARS-CoV-2 by FDA under an Emergency Use Authorization (EUA). This EUA will remain in effect (meaning this test can be used) for the duration of the COVID-19 declaration under Section 564(b)(1) of the Act, 21 U.S.C. section 360bbb-3(b)(1), unless the authorization is terminated or revoked.  Performed at [facility], [place] [address] [place], [place] [address]   Aerobic/Anaerobic Culture w Gram Stain (surgical/deep wound)     Status: None   Collection Time: 12/02/20  4:34 PM   Specimen: PATH Digit amputation; Tissue  Result Value Ref Range Status   Specimen  Description TISSUE LEFT TOE  Final   Special Requests PATIENT ON FOLLOWING CEFEPIME MAXIPIME VANC  Final   Gram Stain   Final    RARE WBC PRESENT, PREDOMINANTLY PMN NO ORGANISMS  SEEN    Culture   Final    No growth aerobically or anaerobically. Performed at [facility], [place] [address] [place], [place] [address]    Report Status 12/07/2020 FINAL  Final  Culture, blood (routine x 2)     Status: None   Collection Time: 12/04/20 12:14 PM   Specimen: BLOOD  Result Value Ref Range Status   Specimen Description BLOOD LEFT ANTECUBITAL  Final   Special Requests   Final    BOTTLES DRAWN AEROBIC AND ANAEROBIC Blood Culture adequate volume   Culture   Final    NO GROWTH 5 DAYS Performed at [facility], [place] [address], [place], [place] [address]    Report Status 12/09/2020 FINAL  Final  Culture, blood (routine x 2)     Status: None   Collection Time: 12/04/20 12:14 PM   Specimen: BLOOD LEFT FOREARM  Result Value Ref Range Status   Specimen Description BLOOD LEFT FOREARM  Final   Special Requests   Final    BOTTLES DRAWN AEROBIC AND ANAEROBIC Blood Culture adequate volume   Culture   Final    NO GROWTH 5 DAYS Performed at [facility], [place] [address], [name], [place] [address]    Report Status 12/09/2020 FINAL  Final   Pertinent Lab. CBC Latest Ref Rng & Units 12/09/2020 12/07/2020 12/05/2020  WBC 4.0 - 10.5 K/uL 7.9 9.5 7.3  Hemoglobin 13.0 - 17.0 g/dL 9.4(L) 10.8(L) 10.4(L)  Hematocrit 39.0 - 52.0 % 29.2(L) 32.6(L) 31.3(L)  Platelets 150 - 400 K/uL 553(H) 598(H) 509(H)   CMP Latest Ref Rng & Units 12/10/2020 12/09/2020 12/09/2020  Glucose 70 - 99 mg/dL - 138(H) 196(H)  BUN 6 - 20 mg/dL - 9 14  Creatinine 0.61 - 1.24 mg/dL - 0.81 0.78  Sodium 135 - 145 mmol/L - 136 136  Potassium 3.5 - 5.1 mmol/L - 4.1 4.0  Chloride 98 - 111 mmol/L - 99 100  CO2 22 - 32 mmol/L - 31 30  Calcium 8.9 - 10.3 mg/dL - 8.8(L) 8.6(L)  Total Protein 6.5 - 8.1 g/dL 6.1(L) 6.6 6.2(L)   Total Bilirubin 0.3 - 1.2 mg/dL 0.4 0.4 0.5  Alkaline Phos 38 - 126 U/L 184(H) 209(H) 163(H)  AST 15 - 41 U/L 161(H) 323(H) 558(H)  ALT 0 - 44 U/L 259(H) 340(H) 269(H)    Pertinent Imaging today Plain films and CT images have been personally visualized and interpreted; radiology reports have been reviewed. Decision making incorporated into the Impression / Recommendations.  MRI Rt Hip 12/10/20 FINDINGS: Bones: Progressive increased marrow edema and enhancement with corresponding decreased T1 marrow signal involving the parasymphyseal right superior pubic ramus. Unchanged increased T2 marrow signal in the left acetabulum with preserved T1 marrow signal, nonspecific, potentially reactive. There is no evidence of acute fracture, dislocation or avascular necrosis. 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 The visualized sacroiliac joints and symphysis pubis appear normal.  Articular cartilage and labrum  Articular cartilage: No focal chondral defect or subchondral signal abnormality identified.  Labrum: Grossly intact, although evaluation is limited due to lack of intra-articular fluid. No paralabral abnormality.  Joint or bursal effusion  Joint effusion: No significant hip joint effusion.  Bursae: No focal periarticular fluid collection.  Muscles and tendons  Muscles and tendons: The visualized gluteus, hamstring and iliopsoas tendons appear normal. Improving edema and enhancement in the right adductor minimus muscle. No muscle  atrophy.  Other findings  Miscellaneous: The visualized internal pelvic contents appear unremarkable. Decreasing bilateral inguinal lymphadenopathy.  IMPRESSION: 1. Worsening osteomyelitis of the parasymphyseal right superior pubic ramus. 2. Improving right adductor minimus myositis.  I have spent approx 30 minutes for this patient encounter including review of prior  medical records, coordination of care  with greater than 50% of time being face to face/counseling and discussing diagnostics/treatment plan with the patient/family.  Electronically signed by:   Rosiland Oz, MD Infectious Disease Physician [facility] Pager: [phone]

## 2020-12-10 NOTE — Discharge Instructions (Signed)
You are hospitalized due to concern for infection in your bones of your back.  While you were hospitalized, you were found to have a blood infection (bacteremia) as well as infections of your left second toe bones and in your right hip.  You were started on IV antibiotics, which we will continue for a long course (total of 8 weeks). A PICC line was placed into your arm so that you could continue the antibiotic, Daptomycin, at home. You will need to continue them until 5/18. You can manage your pain with tylenol, ibuprofen and oxycodone (reseverved more for severe pain).   You were also found to have enlargement of one of the bile ducts and underwent an ERCP. You will need a repeat ERCP by the GI doctors next week to ensure that the stent that they placed in your pancreas has successfully migrated. You will likely have repeat blood drawn at the clinic to monitor your liver enzymes as well. Additionally, please continue fluconazole to treat the yeast infection in your throat until 4/6.   You should call the [facility] clinic to schedule a lab-only appointment on 4/4 for a CK check. This should be done weekly while you are taking Daptomycin. You have an appointment on 4/11 with your PCP, Dr. Tarry Kos at 1:30 PM (please arrive by 1:15PM). If you begin to have severe, uncontrolled pain or fevers (100.4 or greater) that are unresponsive to Tylenol or are persistent, please call to schedule an earlier appointment or go to the ED.    Thank you for allowing [place] to be apart of your care. [facility]

## 2020-12-10 NOTE — Progress Notes (Addendum)
Family Medicine Teaching Service Daily Progress Note Intern Pager: [phone]  Patient name: Christopher Fernandez Medical record number: [id number] Date of birth: 02-Dec-1970 Age: 50 y.o. 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, also left foot 2nd digit osteomyelitis with abscess 3/21: Amputation of left 2nd toe with resection of metatarsal head  3/25: ERCP: biliary sludge, severely dilated bile duct, pancreatic stent placed 3/27: PICC placed  Assessment and Plan: Christopher Fernandez a 50 y.o.male who presentedwith back pain, found to have osteomyelitis of T9/T10.Additionally now with MRSA bacteremia and concern for right hip pyomyositis.PMH is significant forcellulitis, diabetes, neuropathy, anemia, hyperlipidemia, asthma, alcohol abuse, tobacco use disorder, trauma, cholecystectomy, asplenia, anxietyanddepression.  MRSA bacteremia Osteomyelitis of T9/T10 and left 2nd toePOD #7 from amputation of 2nd digit with resection of metatarsal head  PICC in place. Patient to continue Daptomycin for 6 week total course (until 5/3).  -Continue Daptomycin -ID following, appreciate recommendations -Pain regimen: Tylenol 650 mg q6h  Gabapentin 600 mg TID Lidocaine patch qd Oxycodone IR 10 mg q4h PRN severe, breakthrough pain  Ibuprofen 400 mg TID             Baclofen BID -Continue PT/OT  Right Hip Pain r/o pyomyositis MRI hip today.  -F/u MRI  -ID following, appreciate recommendations  Elevated LFT's Abdominal Pain  Hx Biliary Sludge  Hx Chronic Pancreatitis  AST/ALT downtrending. AST [id number], ALT 269>340>259. Alk Phos [id number]. Sed rate and CRP elevated to 106 and 8.4 respectively, there is no prior comparison. Surgical pathology from  endoscopy resulted and negative for h. Pylori, only notable for chronic inflammation.  -GI consulted; recommend repeat ERCP in 1 week, will follow outpatient. -Monitor pain  T2DM with Neuropathy CBG's ranging 121-186. Requiring 7U SSI. -Continue Lantus 18U -mSSI with HS coverage -Continue Gabapentin   Esophageal Candidiasis -Continue Fluconazole 200 mg until 4/6  Thrombocytosis Throughout this entire hospitalization, have been in 400-500 range throughout admission. -Monitor with CBC  Mild Constipation -MiraLAX daily PRN -Colace BID  Pancreatic insufficiencychronic pancreatitis -Continue Creon 36, 000 units with meals  Hyperlipidemia: Chronic, stable -Holding home atorvastatin -Restart statin on d/c  Hx asthma: Chronic, stable - Dulera, per formulary - Albuterol PRN  Hx alcohol abuse  Tobacco use disorder -Encourage smoking cessation  Hx Tremor: chronic, stable - Continue propranolol 80 mg daily  Hx Asplenia, Cholecystectomy: chronic, stable -Consider Meningococal vaccine as outpatient  Anxiety with Depression: Chronic, stable - Continue homearipirazole, cymbalta, and mirtazapine  FEN/GI:Regular diet ION:GEXBMWU   Status is: Inpatient  Remains inpatient appropriate because:Ongoing diagnostic testing needed not appropriate for outpatient work up   Dispo:  Patient From: Home  Planned Disposition: Home with Health Care Svc  Medically stable for discharge: No      Subjective:  Mr. Christopher Fernandez continues to have right hip pain, actually feels that it is worse today than it was yesterday. His back pain is improved, however, with the re-introduction of Ibuprofen. He was unable to work with PT yesterday due to his pain when the ibuprofen was discontinued. He is ready to get the MRI of his hip and ready for the next steps.   Objective: Temp:  [97.7 F (36.5 C)-98.7 F (37.1 C)] 98.4 F (36.9 C) (03/29 0742) Pulse Rate:  [59-76] 59 (03/29  0742) Resp:  [15-20] 15 (03/29 0742) BP: (122-146)/(71-97) 145/97 (  03/29 0742) SpO2:  [94 %-99 %] 97 % (03/29 0742) Physical Exam: General: Awake, alert, in no distress, pleasant  Cardiovascular: RRR without murmur Respiratory: mild inspiratory and expiratory wheezing in b/l upper fields, good air movement, no respiratory distress Abdomen: soft, mild tenderness on palpation to RUQ and LLQ without rebound/guarding, soft Extremities: No edema, 2+ radial and DP pulses b/l   Laboratory: Recent Labs  Lab 12/05/20 0500 12/07/20 0237 12/09/20 0320  WBC 7.3 9.5 7.9  HGB 10.4* 10.8* 9.4*  HCT 31.3* 32.6* 29.2*  PLT 509* 598* 553*   Recent Labs  Lab 12/08/20 0130 12/09/20 0320 12/09/20 1551 12/10/20 0212  NA 135 136 136  --   K 4.4 4.0 4.1  --   CL 99 100 99  --   CO2 '29 30 31  ' --   BUN '11 14 9  ' --   CREATININE 0.80 0.78 0.81  --   CALCIUM 8.7* 8.6* 8.8*  --   PROT 6.6 6.2* 6.6 6.1*  BILITOT 0.4 0.5 0.4 0.4  ALKPHOS 143* 163* 209* 184*  ALT 53* 269* 340* 259*  AST 22 558* 323* 161*  GLUCOSE 257* 196* 138*  --    Imaging/Diagnostic Tests: None new.   Sharion Settler, DO 12/10/2020, 8:06 AM PGY-1, [facility] Intern pager: [phone], text pages welcome

## 2020-12-10 NOTE — Telephone Encounter (Signed)
Left message to check on referral

## 2020-12-10 NOTE — Plan of Care (Signed)
  Problem: Education: Goal: Knowledge of General Education information will improve Description: Including pain rating scale, medication(s)/side effects and non-pharmacologic comfort measures Outcome: Completed/Met   Problem: Health Behavior/Discharge Planning: Goal: Ability to manage health-related needs will improve Outcome: Completed/Met   Problem: Clinical Measurements: Goal: Ability to maintain clinical measurements within normal limits will improve Outcome: Completed/Met Goal: Will remain free from infection Outcome: Completed/Met Goal: Diagnostic test results will improve Outcome: Completed/Met Goal: Respiratory complications will improve Outcome: Completed/Met Goal: Cardiovascular complication will be avoided Outcome: Completed/Met   Problem: Activity: Goal: Risk for activity intolerance will decrease Outcome: Completed/Met   Problem: Nutrition: Goal: Adequate nutrition will be maintained Outcome: Completed/Met   Problem: Coping: Goal: Level of anxiety will decrease Outcome: Completed/Met   Problem: Elimination: Goal: Will not experience complications related to bowel motility Outcome: Completed/Met Goal: Will not experience complications related to urinary retention Outcome: Completed/Met   Problem: Pain Managment: Goal: General experience of comfort will improve Outcome: Completed/Met   Problem: Safety: Goal: Ability to remain free from injury will improve Outcome: Completed/Met   Problem: Skin Integrity: Goal: Risk for impaired skin integrity will decrease Outcome: Completed/Met   Problem: Acute Rehab PT Goals(only PT should resolve) Goal: Patient Will Transfer Sit To/From Stand Outcome: Completed/Met Goal: Pt Will Perform Standing Balance Or Pre-Gait Outcome: Completed/Met Goal: Pt Will Ambulate Outcome: Completed/Met Goal: Pt Will Go Up/Down Stairs Outcome: Completed/Met  Discharge instructions reviewed with patient.  These included, but were  not limited to, the following:  Reason for admission, new medications, activity level, when to call the MD, PICC line care, home IV antibiotic therapy, follow-up appointments, incision care, signs and symptoms of infection, etc.  Pt anxious to be discharged to home with [facility] care.  [place] nurse in to instruct as to schedule of antibiotic administration.  Comprehension of discharge instructions ascertained via use of "teach-back" communication.  Pt discharged to home via car driven by wife.  Escorted to exit by nurse tech via wheelchair.

## 2020-12-10 NOTE — TOC Transition Note (Signed)
Transition of Care [facility]) - CM/SW Discharge Note   Patient Details  Name: Christopher Fernandez MRN: [id number] Date of Birth: [birth year]  Transition of Care [facility]) CM/SW Contact:  Sharin Mons, RN Phone Number: 12/10/2020, 5:11 PM   Clinical Narrative:     Patient will DC to: HOME Anticipated DC date: 12/10/2020 Family notified: yes , wife Transport by: Car   Per MD patient ready for DC today . RN, patient, patient's family aware of DC. Home health services to begin with [facility] 3[facility]30. Pt without Rx med concerns. Post hospital f/u noted on AVS.  Final next level of care: [place] Barriers to Discharge: No Barriers Identified   Patient Goals and CMS Choice Patient states their goals for this hospitalization and ongoing recovery are:: return home CMS Medicare.gov Compare Post Acute Care list provided to:: Patient Choice offered to / list presented to : Patient  Discharge Placement                       Discharge Plan and Services   Discharge Planning Services: CM Consult Post Acute Care Choice: Home Health          DME Arranged: Other see comment ([facility], [facility])   Date DME Agency Contacted: 12/05/20 Time DME Agency Contacted: [phone] Representative spoke with at DME Agency: [place]: IV Antibiotics HH Agency: [facility] Date [place]: 12/09/20 Time [place]: 1355 Representative spoke with at [place]: [place] ([place]) Interventions     Readmission Risk Interventions No flowsheet data found.

## 2020-12-10 NOTE — Plan of Care (Signed)

## 2020-12-11 ENCOUNTER — Other Ambulatory Visit: Payer: Self-pay | Admitting: Family Medicine

## 2020-12-11 ENCOUNTER — Telehealth: Payer: Self-pay

## 2020-12-11 ENCOUNTER — Telehealth: Payer: Self-pay | Admitting: Family Medicine

## 2020-12-11 DIAGNOSIS — A4902 Methicillin resistant Staphylococcus aureus infection, unspecified site: Secondary | ICD-10-CM | POA: Diagnosis not present

## 2020-12-11 DIAGNOSIS — L02611 Cutaneous abscess of right foot: Secondary | ICD-10-CM | POA: Diagnosis not present

## 2020-12-11 DIAGNOSIS — S98011A Complete traumatic amputation of right foot at ankle level, initial encounter: Secondary | ICD-10-CM | POA: Diagnosis not present

## 2020-12-11 DIAGNOSIS — A327 Listerial sepsis: Secondary | ICD-10-CM | POA: Diagnosis not present

## 2020-12-11 DIAGNOSIS — M869 Osteomyelitis, unspecified: Secondary | ICD-10-CM

## 2020-12-11 DIAGNOSIS — F418 Other specified anxiety disorders: Secondary | ICD-10-CM

## 2020-12-11 DIAGNOSIS — G4700 Insomnia, unspecified: Secondary | ICD-10-CM

## 2020-12-11 MED ORDER — BACLOFEN 5 MG PO TABS
5.0000 mg | ORAL_TABLET | Freq: Two times a day (BID) | ORAL | 0 refills | Status: DC | PRN
Start: 2020-12-11 — End: 2020-12-11

## 2020-12-11 MED ORDER — LIDOCAINE 5 % EX PTCH
1.0000 | MEDICATED_PATCH | CUTANEOUS | 0 refills | Status: DC
Start: 2020-12-11 — End: 2020-12-11

## 2020-12-11 MED FILL — BACLOFEN 5 MG TABS: 5 | 30 days supply | Qty: 60 | Fill #0

## 2020-12-11 MED FILL — LIDOCAINE PATCH 5%: 5 | 30 days supply | Qty: 30 | Fill #0

## 2020-12-11 NOTE — Telephone Encounter (Signed)
LVM informing patient of apt time switch. Patient is now seeing PCP on 4/4 @10 :50am.

## 2020-12-11 NOTE — Telephone Encounter (Signed)
Rx for baclofen and lidoderm to pharmacy.  Please let him know to stop cyclobenzaprine.   Dorris Singh, MD  Family Medicine Teaching Service

## 2020-12-11 NOTE — Telephone Encounter (Signed)
Patient calls nurse line following up with hospital stay. Patient reports he has picked up all medications, however is requesting baclofen and lidocaine patches. Patient reports he has to come in for labs on 4/4 and is requesting an office visit as well, as he has some questions. Patient scheduled for 4/4 with Owens Shark, as he requested her or Dr. Nita Sells.

## 2020-12-11 NOTE — Telephone Encounter (Signed)
Called patient to check on wound status and review wound care instructions. Patient reports that his wound is healing well, pink, very little drainage, clean bandages. He has a [facility] RN that is due to start. His wife is also a Therapist, sports and has been looking after it overnight. He knows about his appt with [facility] on 4/14 for suture removal.   Of note, patient reports that he started having some aching pain on his left flank about four finger breadths below the breast line, he describes the area as more dorsal than ventral. His wife has not looked at it, he does not have any noticeable lump that he can feel, no fever. I recommended he continue his pain regimen, have his wife look at the site and/or send pictures through my chart. If a lump develops, pain is worse and not able to be controlled with home medications, or other concern, they can call our office for a visit. He presently has an appt at 8:50 AM with Dr. Owens Shark on Monday, 12/16/20.  Gladys Damme, MD [facility], PGY-2

## 2020-12-11 NOTE — Telephone Encounter (Signed)
Spoke with patient and he is ok with seeing PCP for his 4/4 hospital followup.

## 2020-12-12 ENCOUNTER — Other Ambulatory Visit: Payer: Self-pay | Admitting: Family Medicine

## 2020-12-12 ENCOUNTER — Other Ambulatory Visit: Payer: Self-pay | Admitting: *Deleted

## 2020-12-12 MED FILL — MIRTAZAPINE 30 MG TABLET: 30 | 90 days supply | Qty: 90 | Fill #0

## 2020-12-12 NOTE — Patient Instructions (Signed)

## 2020-12-12 NOTE — Patient Outreach (Signed)
[place] [facility]) Care Management  12/12/2020  GUMECINDO HOPKIN 08/23/1971 [id number]   Transition of care call/case closure   Referral received:11/29/20 Initial outreach:12/12/20 Insurance: [place] [employer]    Subjective: Initial successful telephone call to patient's preferred number in order to complete transition of care assessment; 2 HIPAA identifiers verified. Explained purpose of call and completed transition of care assessment.  Christopher Fernandez states generally he is doing good. She denies post-operative problems, says surgical incisions are unremarkable, states surgical pain well managed with prescribed medications.He discussed contact with PCP since discharge with request for baclofen and lidocaine patch with is helping with pain control management especially with back and pelvis area.  He discussed that his left foot toe area looks good no increase in swelling , no drainage no signs of infection. He reports that his wife is a Marine scientist and she assist with observing site and dressing change. He report   tolerating diet, denies nausea abdominal pain,  bowel or bladder problems.  He discussed recent blood sugar this am 142 and at this current time 198, he has Dexcom for monitoring readings. He discussed that he has been working with pharmacist at PCP office regarding Diabetes medication management .  Patient  spouse is assisting with his recovery.  Patient reports tolerating mobility in the home using his cane. He reports having a recliner in home now that is helpful with keeping legs elevated. He discussed PICC line in place for daily antibiotic, medication delivered to home, he had  initial visit from Home health on yesterday for IV antibiotic infusion assistance he states plan for weekly visits from nurse.  .   Reviewed accessing the following [place] Benefits : He discussed prior referral to [facility] health condition management program and he recalls that he may have had to cancel  telephone visit. He is agreeable to contact number for active health management for follow up, provided contact number. Patient discussed that he has reduced the amount of cigarettes that he smokes daily, down to less than 5 cigarettes  in the last month from one pack. Reviewed benefits of smoking cessation support available as well as benefit of smoking cessation and glucose control to help promote wound healing. She is agreeable to receiving education material on smoking cessation as well provided contact number to [employer] well smoking cessation support .  He states that his wife has the  the hospital indemnity and has placed call to file the claim  He  uses a [facility] outpatient pharmacy at [facility] outpatient pharmacy.   Objective:  Christopher Fernandez  was hospitalized at [facility] for Osteomyelitis,T9/T10 and  left 2nd toe,12/02/20  Left 2nd toe amputation,  MRSA bacteremia , right hip pain,concern for early osteomyelitis of superior pubic ramus, RUQ pain ERCP on 3/25 significant for biliary sludge and pancreatic stent placed . PICC line placed for IV antibiotics for 6 to 8 weeks as noted .   Comorbidities include: Type 2 Diabetes, A1c 9.7% on 10/23/20 , Neuropathy, hyperlipidemia, Alcohol abuse,Tobacco use,  anxiety and depression, Asthma, amputation of left first toe, Right transmetatarsal amputation.[name]  He  was discharged to home on 12/10/20 with [facility] infusion for IV antibiotics and [facility]  on 3/30  for home health services or DME.   Assessment:  Patient voices good understanding of all discharge instructions.  See transition of care flowsheet for assessment details.   Plan:  Reviewed hospital discharge diagnosis of Osteomyelitis, left 2nd toe amputation and discharge treatment  plan using hospital discharge instructions, assessing medication adherence, reviewing problems requiring provider notification, and discussing the importance of follow up with surgeon, primary care provider  and/or specialists as directed. Reviewed upcoming post discharge provider visits, verified that he has transportation.   Reviewed [place] healthy lifestyle program information to receive discounted premium for  2023   Step 1: Get  your annual physical  Step 2: Complete your health assessment  Step 3:Identify your current health status and complete the corresponding action step between September 14, 2020 and May 15, 2021.    Using [place] website, verified that patient is an active participate in [place]'s Active Health Management chronic disease management program.  Patient case listed as open.   Patient agreeable to  call in the next week for follow up on progress and contact regarding Condition management program and assess for ongoing  post discharge care needs.  Patient agreeable to follow up call in the  next 2 weeks.   Route successful outreach letter with [place] Management pamphlet and 24 Hour Nurse Line Magnet to [place] Management clinical pool to be mailed to patient's home address. Will also include education handout on quitting smoking.   Joylene Draft, RN, BSN  [place] Management Coordinator  [phone]- Mobile [phone]- Toll Free Main Office

## 2020-12-13 NOTE — Telephone Encounter (Signed)
Per the staff at the pain clinic the referral is still pending review. mychart message has been sent to the pt.

## 2020-12-14 ENCOUNTER — Other Ambulatory Visit (HOSPITAL_COMMUNITY): Payer: Self-pay

## 2020-12-15 NOTE — Progress Notes (Signed)
Subjective:   Patient ID: Christopher Fernandez    DOB: July 20, 1971, 50 y.o. male   MRN: [id number]  Christopher Fernandez is a 50 y.o. male with a history of pericardial effusion, HTN, asthma, COPD, h/o esophageal thrush, GERD, exocrine pancreatic insufficiency, chronic pancreatitis, choledocholithiasis with biliary sludge, uncontrolled T2DM with neuropathy and osteomyelitis/discitis of bilateral feet/spine/pelvic region/thigh, HLD, BPH, tobacco use disorder, RLS, ED, asplenia, anxiety/depression, anemia, alcohol use disorder, action tremor, abnormal ABI"s here for Hospital Follow up   Hospital Follow up: Patient was hospitalized from 3/18 to 3/29 for osteomyelitis of T9/10 and left 2nd digit with MRSA bacteremia, biliary sludge, and esophageal candidiasis  Discharge summary reviewed and summarized below: 1. MRSA bacteremia  Osteomyelitis of T9/T10 and left foot: Osteomyelitis found via MRCP. Blood cultures notable for MRSA bacteriemia and MRI of left 2nd toe notable for osteomyelitis. S/p Amputation by vascular surgery on 3/21. PICC placed during admission for 6 week course of Daptomycin (complete on 5/3).   Today patient notes he has been tolerating his antibiotics well. Notes that the pain severity improved some yesterday and he is finding improved ROM. Denies any fevers or chills. Notes that his left foot seems to be healing well. Denies any erythema, warmth, or discharge. He has been doing basic wound care daily without complication. Follow up with vascular surgery on 4/14 for wound recheck. Currently treating pain with Percocet, Tylenol, ibuprofen, and Baclofen with good pain control.  2. Right Hip pain 2/2 Osteomyelitis and Myositis: MRI of right hip on 3/21 concerning for early osteomyelitis of superior pubic ramus and myositis of right adductor minimus muscle. Repeat MRI on 3/29 notable for improvement of myositis but worsening of pelvic ramus osteomyelitis. Plan as above.  3. Esophageal Candidiasis:  Noted on EGD. Treated with oral fluconazole, last dose 4/6.    4. LFT abnormalities  Bile Duct Dilatation  H/o Cholecystectomy:  MRI/MCRP on March 18 which showed 2 small hypointense filling defects, unable to r/o choledocolithiasis. RUQ US obtained on 3/23 demonstrated increased bile duct dilatation to 19 mm. S/p ERCP on 3/25 with pancreatic duct and biliary duct stent placement Follow up with GI on 4/20.   Procedures preformed in-patient: Upper GI Endoscopy on 3/9 showed retained food concerning for gastroparesis vs opioids, esophageal plaques concerning for candidiasis, and gastric erosions without bleeding.  Amputation of left 2nd toe with resection of metatarsal head in setting of abscess and osteomyelitis on 3/21. Transesophageal echo on 3/23 that was normal without vegetations. ERCP on 3/25 for bile duct stones, abnormal MRCP, and biliary dilation on [place] with elevated liver enzymes. He received Indomethacin, and Cipro prior to procedure. He had canculation of the bile duct and pancreatic duct with good contrast flow. Few pancreatic stone fragments removed. Biliary sludge was removed. He also had evidence of gastritis. Patient will need KUB 2-view 10-14 days post procedure. If pancreatic duct is still present it will need to be pulled via EGD. Per GI, he is at risk for biliary sludge ball development    Appointment scheduled with PCP on 12/23/20 Follow up with Vascular on 12/26/20 Follow up with GI on 01/01/21  Review of Systems:  Per HPI.   Objective:   BP 120/88   Pulse 74   Ht 6\' 3"  (1.905 m)   Wt 248 lb 12.8 oz (112.9 kg)   SpO2 97%   BMI 31.10 kg/m  Vitals and nursing note reviewed.  General: pleasant older male, sitting comfortably in exam chair, well nourished, well developed,  in no acute distress with non-toxic appearance Resp: breathing comfortably on room air, speaking in full sentences Skin: warm, dry Extremities: warm and well perfused, right foot with well appearing  surgical incision with sutures in place, no erythema, warmth, or discharge. Edges well approximated.  MSK: good right hip ROM - able to flex, abduct and internally rotate with no pain Neuro: Alert and oriented, speech normal  Assessment & Plan:   Choledocholithiasis MRI/MCRP on March 18 which showed 2 small hypointense filling defects, unable to r/o choledocolithiasis. RUQ US obtained on 3/23 demonstrated increased bile duct dilatation to 19 mm. S/p ERCP on 3/25 with pancreatic duct and biliary duct stent placement. Patient endorses improved pain overall. Plan for KUB on 12/20/20 per GI recs and Follow up with GI on 4/20.   Uncontrolled diabetes mellitus with hyperglycemia, with long-term current use of insulin ([place]) Follow up on 12/23/20 for diabetes management  Osteomyelitis (HCC) Acute, improving. MRI confirmed osteomyelitis and myositis of T spine and right hip, as well as left 2nd toe s/p amputation. Currently on 6-week course of Daptomycin via PICC line with completion date of 01/14/21. Follow up with ID on 01/19/21. Pain well controlled with current regimen with slow improvement overall. Improved ROM which is reassuring. No fevers/chills. Left foot well healing.  - CK and CMP today to monitor while on daptomycin - continue weekly CK levels while on antibiotic - continue daily wound care of left foot, elevation, and follow up with vascular surgery as scheduled  Orders Placed This Encounter  Procedures  . CK  . Comprehensive metabolic panel  . Ambulatory referral to Pain Clinic    Referral Priority:   Routine    Referral Type:   Consultation    Referral Reason:   Specialty Services Required    Requested Specialty:   Pain Medicine    Number of Visits Requested:   1   No orders of the defined types were placed in this encounter.     Mina Marble, DO PGY-3, [facility] 12/16/2020 11:49 AM

## 2020-12-16 ENCOUNTER — Other Ambulatory Visit: Payer: Self-pay

## 2020-12-16 ENCOUNTER — Encounter: Payer: Self-pay | Admitting: Family Medicine

## 2020-12-16 ENCOUNTER — Ambulatory Visit: Payer: 59 | Admitting: Family Medicine

## 2020-12-16 VITALS — BP 120/88 | HR 74 | Ht 75.0 in | Wt 248.8 lb

## 2020-12-16 DIAGNOSIS — R7881 Bacteremia: Secondary | ICD-10-CM | POA: Diagnosis not present

## 2020-12-16 DIAGNOSIS — M869 Osteomyelitis, unspecified: Secondary | ICD-10-CM | POA: Diagnosis not present

## 2020-12-16 DIAGNOSIS — Z794 Long term (current) use of insulin: Secondary | ICD-10-CM | POA: Diagnosis not present

## 2020-12-16 DIAGNOSIS — M4645 Discitis, unspecified, thoracolumbar region: Secondary | ICD-10-CM | POA: Diagnosis not present

## 2020-12-16 DIAGNOSIS — K8050 Calculus of bile duct without cholangitis or cholecystitis without obstruction: Secondary | ICD-10-CM | POA: Diagnosis not present

## 2020-12-16 DIAGNOSIS — M60003 Infective myositis, unspecified right leg: Secondary | ICD-10-CM

## 2020-12-16 DIAGNOSIS — E1165 Type 2 diabetes mellitus with hyperglycemia: Secondary | ICD-10-CM

## 2020-12-16 DIAGNOSIS — K838 Other specified diseases of biliary tract: Secondary | ICD-10-CM | POA: Diagnosis not present

## 2020-12-16 DIAGNOSIS — M86151 Other acute osteomyelitis, right femur: Secondary | ICD-10-CM

## 2020-12-16 DIAGNOSIS — B9562 Methicillin resistant Staphylococcus aureus infection as the cause of diseases classified elsewhere: Secondary | ICD-10-CM

## 2020-12-16 NOTE — Assessment & Plan Note (Signed)
Follow up on 12/23/20 for diabetes management

## 2020-12-16 NOTE — Assessment & Plan Note (Addendum)
Acute, improving. MRI confirmed osteomyelitis and myositis of T spine and right hip, as well as left 2nd toe s/p amputation. Currently on 6-week course of Daptomycin via PICC line with completion date of 01/14/21. Follow up with ID on 01/19/21. Pain well controlled with current regimen with slow improvement overall. Improved ROM which is reassuring. No fevers/chills. Left foot well healing.  - CK and CMP today to monitor while on daptomycin - continue weekly CK levels while on antibiotic - continue daily wound care of left foot, elevation, and follow up with vascular surgery as scheduled

## 2020-12-16 NOTE — Patient Instructions (Signed)
It was a pleasure to see you today!  Thank you for choosing [facility] for your primary care.   Our plans for today were:  Please go to [facility] imaging center on Friday for the abdominal x-ray that your GI doctor wanted   I am getting labs today to monitor kidney, liver and electrolytes while taking the antibiotic  Continue to wash wound with soap and water daily. Bandage and keep dry. Elevate your leg when possible. Follow up with vascular surgery on 4/14. Sooner if any signs of redness, warmth, or discharge (bleeding or pus)  After your x-ray on Friday, give GI a call to determine when follow up.   Follow up on 12/23/20 for diabetes management   We are checking some labs today, I will call you if they are abnormal will send you a MyChart message or a letter if they are normal.  If you do not hear about your labs in the next 2 weeks please let us know.  BRING ALL OF YOUR MEDICATIONS WITH YOU TO EVERY VISIT  Best Wishes,   Mina Marble, DO

## 2020-12-16 NOTE — Assessment & Plan Note (Addendum)
MRI/MCRP on March 18 which showed 2 small hypointense filling defects, unable to r/o choledocolithiasis. RUQ US obtained on 3/23 demonstrated increased bile duct dilatation to 19 mm. S/p ERCP on 3/25 with pancreatic duct and biliary duct stent placement. Patient endorses improved pain overall. Plan for KUB on 12/20/20 per GI recs and Follow up with GI on 4/20.

## 2020-12-17 DIAGNOSIS — A4902 Methicillin resistant Staphylococcus aureus infection, unspecified site: Secondary | ICD-10-CM | POA: Diagnosis not present

## 2020-12-17 DIAGNOSIS — S98011A Complete traumatic amputation of right foot at ankle level, initial encounter: Secondary | ICD-10-CM | POA: Diagnosis not present

## 2020-12-17 DIAGNOSIS — S78111A Complete traumatic amputation at level between right hip and knee, initial encounter: Secondary | ICD-10-CM | POA: Diagnosis not present

## 2020-12-17 DIAGNOSIS — L02611 Cutaneous abscess of right foot: Secondary | ICD-10-CM | POA: Diagnosis not present

## 2020-12-17 DIAGNOSIS — A327 Listerial sepsis: Secondary | ICD-10-CM | POA: Diagnosis not present

## 2020-12-17 LAB — COMPREHENSIVE METABOLIC PANEL
ALT: 39 IU/L (ref 0–44)
AST: 13 IU/L (ref 0–40)
Albumin/Globulin Ratio: 0.9 — ABNORMAL LOW (ref 1.2–2.2)
Albumin: 3.5 g/dL — ABNORMAL LOW (ref 3.8–4.9)
Alkaline Phosphatase: 201 IU/L — ABNORMAL HIGH (ref 44–121)
BUN/Creatinine Ratio: 10 (ref 9–20)
BUN: 9 mg/dL (ref 6–24)
Bilirubin Total: 0.2 mg/dL (ref 0.0–1.2)
CO2: 21 mmol/L (ref 20–29)
Calcium: 9.1 mg/dL (ref 8.7–10.2)
Chloride: 98 mmol/L (ref 96–106)
Creatinine, Ser: 0.94 mg/dL (ref 0.76–1.27)
Globulin, Total: 3.7 g/dL (ref 1.5–4.5)
Glucose: 173 mg/dL — ABNORMAL HIGH (ref 65–99)
Potassium: 4.9 mmol/L (ref 3.5–5.2)
Sodium: 136 mmol/L (ref 134–144)
Total Protein: 7.2 g/dL (ref 6.0–8.5)
eGFR: 98 mL/min/{1.73_m2} (ref >59)

## 2020-12-17 LAB — CK: Total CK: 224 U/L (ref 41–331)

## 2020-12-19 ENCOUNTER — Other Ambulatory Visit: Payer: Self-pay

## 2020-12-19 ENCOUNTER — Other Ambulatory Visit (HOSPITAL_COMMUNITY): Payer: Self-pay

## 2020-12-19 DIAGNOSIS — A327 Listerial sepsis: Secondary | ICD-10-CM | POA: Diagnosis not present

## 2020-12-19 DIAGNOSIS — A4902 Methicillin resistant Staphylococcus aureus infection, unspecified site: Secondary | ICD-10-CM | POA: Diagnosis not present

## 2020-12-19 DIAGNOSIS — S98011A Complete traumatic amputation of right foot at ankle level, initial encounter: Secondary | ICD-10-CM | POA: Diagnosis not present

## 2020-12-19 DIAGNOSIS — L02611 Cutaneous abscess of right foot: Secondary | ICD-10-CM | POA: Diagnosis not present

## 2020-12-19 MED FILL — Gabapentin Cap 300 MG: ORAL | 30 days supply | Qty: 180 | Fill #0 | Status: AC

## 2020-12-19 NOTE — Telephone Encounter (Signed)
PMP reviewed. Last filled on 3/29 for 15 tabs, as such, using about 2 per day. I suspect he may need another few weeks of medications given illness but defer to PCP to ensure appropriate.   Dorris Singh, MD  Family Medicine Teaching Service

## 2020-12-19 NOTE — Telephone Encounter (Signed)
Patient calls nurse line requesting a refill on Oxycodone. Patient reports he has spoken to Dr. Owens Shark about this and wishes to forward this message to her. I advised him I will send to her and PCP for advisement. Please send to [facility].

## 2020-12-20 ENCOUNTER — Encounter: Payer: Self-pay | Admitting: Infectious Disease

## 2020-12-20 ENCOUNTER — Other Ambulatory Visit (HOSPITAL_COMMUNITY): Payer: Self-pay

## 2020-12-20 ENCOUNTER — Ambulatory Visit: Payer: 59 | Admitting: Infectious Disease

## 2020-12-20 ENCOUNTER — Ambulatory Visit
Admission: RE | Admit: 2020-12-20 | Discharge: 2020-12-20 | Disposition: A | Discharge status: Home / Self Care | Payer: 59 | Source: Ambulatory Visit | Attending: Gastroenterology | Admitting: Gastroenterology

## 2020-12-20 ENCOUNTER — Other Ambulatory Visit: Payer: Self-pay

## 2020-12-20 VITALS — BP 145/95 | HR 97 | Temp 97.4°F | Wt 241.0 lb

## 2020-12-20 DIAGNOSIS — M4645 Discitis, unspecified, thoracolumbar region: Secondary | ICD-10-CM

## 2020-12-20 DIAGNOSIS — Z794 Long term (current) use of insulin: Secondary | ICD-10-CM

## 2020-12-20 DIAGNOSIS — A4902 Methicillin resistant Staphylococcus aureus infection, unspecified site: Secondary | ICD-10-CM

## 2020-12-20 DIAGNOSIS — M8618 Other acute osteomyelitis, other site: Secondary | ICD-10-CM

## 2020-12-20 DIAGNOSIS — E1165 Type 2 diabetes mellitus with hyperglycemia: Secondary | ICD-10-CM | POA: Diagnosis not present

## 2020-12-20 DIAGNOSIS — R7881 Bacteremia: Secondary | ICD-10-CM

## 2020-12-20 DIAGNOSIS — M60003 Infective myositis, unspecified right leg: Secondary | ICD-10-CM

## 2020-12-20 DIAGNOSIS — M86151 Other acute osteomyelitis, right femur: Secondary | ICD-10-CM

## 2020-12-20 DIAGNOSIS — Z89431 Acquired absence of right foot: Secondary | ICD-10-CM

## 2020-12-20 DIAGNOSIS — Q8901 Asplenia (congenital): Secondary | ICD-10-CM | POA: Diagnosis not present

## 2020-12-20 DIAGNOSIS — E11628 Type 2 diabetes mellitus with other skin complications: Secondary | ICD-10-CM | POA: Diagnosis not present

## 2020-12-20 DIAGNOSIS — Z9689 Presence of other specified functional implants: Secondary | ICD-10-CM | POA: Diagnosis not present

## 2020-12-20 DIAGNOSIS — B9562 Methicillin resistant Staphylococcus aureus infection as the cause of diseases classified elsewhere: Secondary | ICD-10-CM

## 2020-12-20 DIAGNOSIS — L089 Local infection of the skin and subcutaneous tissue, unspecified: Secondary | ICD-10-CM

## 2020-12-20 DIAGNOSIS — K861 Other chronic pancreatitis: Secondary | ICD-10-CM

## 2020-12-20 DIAGNOSIS — B370 Candidal stomatitis: Secondary | ICD-10-CM

## 2020-12-20 MED ORDER — OXYCODONE-ACETAMINOPHEN 10-325 MG PO TABS: ORAL_TABLET | ORAL | 0 refills | Status: DC

## 2020-12-20 MED ORDER — DOXYCYCLINE HYCLATE 100 MG PO TABS
100.0000 mg | ORAL_TABLET | Freq: Two times a day (BID) | ORAL | 11 refills | Status: DC
  Filled 2020-12-20: qty 60, 30d supply, fill #0

## 2020-12-20 NOTE — Progress Notes (Signed)
Subjective:  Chief complaint worsening of back pain  Patient ID: Christopher Fernandez, male    DOB: 02-05-1971, 50 y.o.   MRN: [id number]  HPI  Christopher Fernandez is a 49 year old African-American man with diabetes mellitus asplenia who had a nonhealing wound and underwent right transmetatarsal amputation which we treated with vancomycin cefepime and Flagyl in the past.  Now admitted with back pain and found MRI to have T9-T10 discitis and osteomyelitis.  He also had necrotic infection involving his second toe.  His blood culture positive for methicillin-resistant Staphylococcus aureus.  MRI of the foot actually shown some evidence of osteomyelitis in the distal function second toe as well as edema in Ham's the proximal middle phalange of the second toe and plantar surface of the head of the second metatarsal.  There is also edema enhancement on the head of the first metatarsal and medial lateral sesamoids that were also worrisome for osteomyelitis  Surgery aware of these findings but did not wish to proceed with further debridement or amputation of the first digit but rather observe this area which was a postoperative site.  Dr. Oneida Alar did amputate the second toe along with the metatarsal head.  The patient had an MRI of the hip as well which showed osteomyelitis of the symphysis pubis.  There was also concern for myositis and we had a repeat MRI of the pelvis performed which showed resolution nearly of the myositis and no pyomyositis but worsening of the back osteomyelitis.  He has been on IV daptomycin at home having been discharged from the hospital roughly a week ago.  He had said his pain in the back had initially improved but the last 3 days has worsened significantly and is similar to how he felt when he was in the hospital.  Groin pain where he had myositis has improved dramatically.    Past Medical History:  Diagnosis Date  . Alcoholism ([place])   . Anemia   . Anxiety   . Arthritis   . Asthma    . Blood transfusion without reported diagnosis   . COPD (chronic obstructive pulmonary disease) ([place])   . Depression   . Diabetes ([place])    type 2  . GERD (gastroesophageal reflux disease)   . History of hiatal hernia   . HTN (hypertension)   . Neuromuscular disorder (HCC)    tremors  . Osteomyelitis ([place])    right forefoot  . Pancreatitis   . Pneumonia   . Substance abuse ([place])   . 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 Wears glasses     Past Surgical History:  Procedure Laterality Date  . 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 AMPUTATION Right 10/18/2019   Procedure: RIGHT SECOND TOE AMPUTATION;  Surgeon: Newt Minion, MD;  Location: [place];  Service: Orthopedics;  Laterality: Right;  . AMPUTATION Right 11/08/2019   Procedure: RIGHT TRANSMETATARSAL AMPUTATION;  Surgeon: Newt Minion, MD;  Location: [place];  Service: Orthopedics;  Laterality: Right;  . AMPUTATION Left 12/02/2020   Procedure: AMPUTATION 2ND TOE;  Surgeon: Elam Dutch, MD;  Location: [place];  Service: Vascular;  Laterality: Left;  . APPLICATION OF WOUND VAC Right 01/10/2020   Procedure: APPLICATION OF WOUND VAC, right foot;  Surgeon: [name], MD;  Location: [place];  Service: Vascular;  Laterality: Right;  . BILIARY DILATION  12/06/2020   Procedure: BILIARY DILATION;  Surgeon: Mansouraty, Telford Nab., MD;  Location: [place];  Service: Gastroenterology;;  . BIOPSY  12/06/2020   Procedure: BIOPSY;  Surgeon: Mansouraty, Gabriel Jr., MD;  [facility];  Service: Gastroenterology;;  . CHOLECYSTECTOMY  2006   with gallbladder and spleen  . COLONOSCOPY  8-10 years ago    in [place]-normal exam per pt  . ENDOSCOPIC RETROGRADE CHOLANGIOPANCREATOGRAPHY (ERCP) WITH PROPOFOL N/A 12/06/2020   Procedure: ENDOSCOPIC RETROGRADE CHOLANGIOPANCREATOGRAPHY (ERCP) WITH PROPOFOL;  Surgeon: Mansouraty, Gabriel Jr., MD;  [facility];  Service: Gastroenterology;  Laterality: N/A;   . HERNIA REPAIR  2008, 2010   hiatal hernia and 1 additional  . NASAL SEPTUM SURGERY    . PANCREATIC PSEUDOCYST DRAINAGE    . PANCREATIC STENT PLACEMENT  12/06/2020   Procedure: PANCREATIC STENT PLACEMENT;  Surgeon: Mansouraty, Gabriel Jr., MD;  [facility];  Service: Gastroenterology;;  . REMOVAL OF STONES  12/06/2020   Procedure: REMOVAL OF STONES;  Surgeon: Mansouraty, Gabriel Jr., MD;  [facility];  Service: Gastroenterology;;  . SPHINCTEROTOMY  12/06/2020   Procedure: SPHINCTEROTOMY;  Surgeon: Mansouraty, Gabriel Jr., MD;  [facility];  Service: Gastroenterology;;  . SPLENECTOMY  2006  . STUMP REVISION Right 11/24/2019   Procedure: REVISION RIGHT TRANSMETATARSAL AMPUTATION;  Surgeon: Duda, Marcus V, MD;  [facility];  Service: Orthopedics;  Laterality: Right;  . TEE WITHOUT CARDIOVERSION N/A 12/04/2020   Procedure: TRANSESOPHAGEAL ECHOCARDIOGRAM (TEE);  Surgeon: Nahser, Philip J, MD;  [facility];  Service: Cardiovascular;  Laterality: N/A;  . TOTAL KNEE ARTHROPLASTY Right 1982, 1984  . TRANSMETATARSAL AMPUTATION Left 09/06/2020   Procedure: AMPUTATION LEFT GREAT TOE;  Surgeon: Cain, Brandon Christopher, MD;  [facility];  Service: Vascular;  Laterality: Left;  . WOUND DEBRIDEMENT Right 01/10/2020   Procedure: incisional DEBRIDEMENT of RIGHT TRANSMETATARSAL WOUND;  Surgeon: Clark, Christopher J, MD;  [facility];  Service: Vascular;  Laterality: Right;    Family History  Problem Relation Age of Onset  . Diabetes Mother   . Hypertension Mother   . Hyperlipidemia Mother   . Kidney disease Mother   . Thyroid disease Mother   . Breast cancer Mother        mets  . Lung cancer Mother   . Diabetes Father   . Alcohol abuse Sister   . Sickle cell trait Sister   . Diabetes Brother   . Asthma Brother   . Lung cancer Maternal Grandmother   . Kidney disease Maternal Grandmother   . Liver cancer Maternal Uncle        x 4-5  . Kidney  disease Maternal Uncle   . Kidney disease Paternal Uncle   . Colon cancer Neg Hx   . Colon polyps Neg Hx   . Esophageal cancer Neg Hx   . Rectal cancer Neg Hx   . Stomach cancer Neg Hx       Social History   Socioeconomic History  . Marital status: Married    Spouse name: Lydia  . Number of children: 1  . Years of education: Not on file  . Highest education level: Not on file  Occupational History  . Occupation: unemployed  Tobacco Use  . Smoking status: Current Every Day Smoker    Packs/day: 1.00    Years: 38.00    Pack years: 38.00    Types: Cigarettes    Start date: 09/14/1982  . Smokeless tobacco: Never Used  . Tobacco comment: tobacco info given to patient  Vaping Use  . Vaping Use: Never used  Substance and Sexual   Activity  . Alcohol use: Not Currently    Comment: 2-3 per day, none since 2019  . Drug use: Not Currently  . Sexual activity: Not on file  Other Topics Concern  . Not on file  Social History Narrative   One child, 4 step-kids   Social Determinants of Health   Financial Resource Strain: Not on file  Food Insecurity: Not on file  Transportation Needs: Not on file  Physical Activity: Not on file  Stress: Not on file  Social Connections: Not on file    Allergies  Allergen Reactions  . Eggs Or Egg-Derived Products Rash  . Morphine And Related Other (See Comments)    Cant take because of pancreatitis  . Cocoa Rash     Current Outpatient Medications:  .  acetaminophen (TYLENOL 8 HOUR) 650 MG CR tablet, Take 1 tablet (650 mg total) by mouth every 8 (eight) hours as needed for pain., Disp: , Rfl:  .  albuterol (VENTOLIN HFA) 108 (90 Base) MCG/ACT inhaler, INHALE 2 TO 4 PUFFS INTO THE LUNGS EVERY 4 HOURS AS NEEDED FOR WHEEZING OR COUGH., Disp: 18 g, Rfl: 11 .  ARIPiprazole (ABILIFY) 5 MG tablet, TAKE 1 TABLET (5 MG TOTAL) BY MOUTH DAILY. (Patient taking differently: Take 5 mg by mouth every evening.), Disp: 90 tablet, Rfl: 2 .  Baclofen 5 MG TABS,  TAKE 1 TABLET BY MOUTH 2 TIMES DAILY AS NEEDED, Disp: 60 tablet, Rfl: 0 .  bismuth subsalicylate (PEPTO BISMOL) 262 MG/15ML suspension, Take 30 mLs by mouth every 6 (six) hours as needed for indigestion or diarrhea or loose stools., Disp: , Rfl:  .  cetirizine (ZYRTEC) 10 MG tablet, Take 1 tablet (10 mg total) by mouth daily. (Patient taking differently: Take 10 mg by mouth daily as needed for allergies.), Disp: 30 tablet, Rfl: 11 .  Continuous Blood Gluc Receiver (DEXCOM G6 RECEIVER) DEVI, 1 Device by Does not apply route 4 (four) times daily., Disp: 1 each, Rfl: 11 .  Continuous Blood Gluc Transmit (DEXCOM G6 TRANSMITTER) MISC, 4 (four) times daily. as directed, Disp: , Rfl:  .  daptomycin (CUBICIN) IVPB, Inject 1,000 mg into the vein daily. Indication:  Osteomyelitis First Dose: No Last Day of Therapy:  01/29/2021 Labs - Once weekly:  CBC/D, BMP, and CPK Labs - Every other week:  ESR and CRP Method of administration: IV Push Method of administration may be changed at the discretion of home infusion pharmacist based upon assessment of the patient and/or caregiver's ability to self-administer the medication ordered., Disp: 55 Units, Rfl: 0 .  DULoxetine (CYMBALTA) 60 MG capsule, TAKE 1 CAPSULE (60 MG TOTAL) BY MOUTH DAILY., Disp: 90 capsule, Rfl: 2 .  EASY TOUCH PEN NEEDLES 31G X 5 MM MISC, Inject 1 application as directed 3 (three) times daily., Disp: 100 each, Rfl: 11 .  empagliflozin (JARDIANCE) 25 MG TABS tablet, TAKE 1 TABLET BY MOUTH DAILY., Disp: 90 tablet, Rfl: 3 .  fluconazole (DIFLUCAN) 200 MG tablet, TAKE 1 TABLET (200 MG TOTAL) BY MOUTH DAILY FOR 7 DAYS., Disp: 7 tablet, Rfl: 0 .  fluticasone-salmeterol (ADVAIR HFA) 115-21 MCG/ACT inhaler, Inhale 1 puff into the lungs 2 (two) times daily., Disp: , Rfl:  .  Fluticasone-Salmeterol (ADVAIR) 250-50 MCG/DOSE AEPB, INHALE 1 PUFF INTO THE LUNGS 2 TIMES DAILY. (Patient taking differently: Inhale 1 puff into the lungs in the morning and at bedtime.),  Disp: 180 each, Rfl: 3 .  gabapentin (NEURONTIN) 300 MG capsule, TAKE 2 CAPSULES (600 MG TOTAL)  BY MOUTH 3 (THREE) TIMES DAILY., Disp: 180 capsule, Rfl: 2 .  glucose blood test strip, USE AS INSTRUCTED, Disp: , Rfl:  .  glucose blood test strip, USE AS INSTRUCTED, Disp: , Rfl:  .  glucose blood test strip, USE AS INSTRUCTED, Disp: 100 strip, Rfl: 12 .  ibuprofen (ADVIL) 400 MG tablet, TAKE 1 TABLET (400 MG TOTAL) BY MOUTH 3 (THREE) TIMES DAILY., Disp: 30 tablet, Rfl: 0 .  Insulin Glargine-yfgn 100 UNIT/ML SOPN, INJECT 18 UNITS INTO THE SKIN IN THE MORNING., Disp: 15 mL, Rfl: 1 .  insulin lispro (HUMALOG) 100 UNIT/ML KwikPen, INJECT 6-10 UNITS INTO THE SKIN 3 (THREE) TIMES DAILY., Disp: 15 mL, Rfl: 11 .  Lancets (FREESTYLE) lancets, Use as instructed, Disp: 100 each, Rfl: 12 .  lidocaine (LIDODERM) 5 %, PLACE 1 PATCH ONTO THE SKIN DAILY. REMOVE & DISCARD PATCH WITHIN 12 HOURS OR AS DIRECTED BY MD (Patient taking differently: 1 patch daily. Remove & discard patch within 12 hours or as directed by MD), Disp: 30 patch, Rfl: 0 .  metFORMIN (GLUCOPHAGE) 1000 MG tablet, TAKE 1 TABLET (1,000 MG TOTAL) BY MOUTH 2 (TWO) TIMES DAILY WITH A MEAL., Disp: 180 tablet, Rfl: 3 .  mirtazapine (REMERON) 30 MG tablet, TAKE 1 TABLET (30 MG TOTAL) BY MOUTH AT BEDTIME., Disp: 90 tablet, Rfl: 3 .  montelukast (SINGULAIR) 10 MG tablet, TAKE 1 TABLET (10 MG TOTAL) BY MOUTH AT BEDTIME. (Patient taking differently: Take 10 mg by mouth in the morning.), Disp: 90 tablet, Rfl: 11 .  nystatin (MYCOSTATIN) 100000 UNIT/ML suspension, TAKE 5 MLS BY MOUTH 4 (FOUR) TIMES DAILY FOR 10 DAYS., Disp: 200 mL, Rfl: 0 .  PANCRELIPASE, LIP-PROT-AMYL, PO, Take 36,000 Units by mouth with breakfast, with lunch, and with evening meal., Disp: , Rfl:  .  pantoprazole (PROTONIX) 40 MG tablet, TAKE 1 TABLET (40 MG TOTAL) BY MOUTH 2 (TWO) TIMES DAILY., Disp: 60 tablet, Rfl: 0 .  propranolol ER (INDERAL LA) 80 MG 24 hr capsule, TAKE 1 CAPSULE (80 MG  TOTAL) BY MOUTH DAILY., Disp: 90 capsule, Rfl: 1 .  sitaGLIPtin (JANUVIA) 100 MG tablet, TAKE 1 TABLET (100 MG TOTAL) BY MOUTH DAILY., Disp: 90 tablet, Rfl: 10 .  atorvastatin (LIPITOR) 40 MG tablet, TAKE 1 TABLET (40 MG TOTAL) BY MOUTH DAILY., Disp: 90 tablet, Rfl: 3 .  diclofenac (VOLTAREN) 50 MG EC tablet, TAKE 1 TABLET (50 MG TOTAL) BY MOUTH 3 (THREE) TIMES DAILY. (Patient not taking: Reported on 12/20/2020), Disp: 90 tablet, Rfl: 0 .  oxyCODONE-acetaminophen (PERCOCET) 10-325 MG tablet, TAKE 1 TABLET BY MOUTH EVERY 6 (SIX) HOURS AS NEEDED FOR UP TO 15 DAYS FOR PAIN (FOR BREAKTHROUGH PAIN). (Patient not taking: Reported on 12/20/2020), Disp: 15 tablet, Rfl: 0  Review of Systems  Constitutional: Negative for activity change, appetite change, chills, diaphoresis, fatigue, fever and unexpected weight change.  HENT: Negative for congestion, rhinorrhea, sinus pressure, sneezing, sore throat and trouble swallowing.   Eyes: Negative for photophobia and visual disturbance.  Respiratory: Negative for cough, chest tightness, shortness of breath, wheezing and stridor.   Cardiovascular: Negative for chest pain, palpitations and leg swelling.  Gastrointestinal: Negative for abdominal distention, abdominal pain, anal bleeding, blood in stool, constipation, diarrhea, nausea and vomiting.  Genitourinary: Negative for difficulty urinating, dysuria, flank pain and hematuria.  Musculoskeletal: Positive for back pain. Negative for arthralgias, gait problem, joint swelling and myalgias.  Skin: Positive for wound. Negative for color change, pallor and rash.  Neurological: Negative for dizziness, tremors, weakness   and light-headedness.  Hematological: Negative for adenopathy. Does not bruise/bleed easily.  Psychiatric/Behavioral: Negative for agitation, behavioral problems, confusion, decreased concentration, dysphoric mood and sleep disturbance.       Objective:   Physical Exam Constitutional:      Appearance: He  is well-developed.  HENT:     Head: Normocephalic and atraumatic.  Eyes:     Conjunctiva/sclera: Conjunctivae normal.  Cardiovascular:     Rate and Rhythm: Normal rate and regular rhythm.     Heart sounds: No murmur heard. No friction rub. No gallop.   Pulmonary:     Effort: Pulmonary effort is normal. No respiratory distress.     Breath sounds: Normal breath sounds. No stridor. No wheezing.  Abdominal:     General: Abdomen is flat. There is no distension.     Palpations: Abdomen is soft. There is no mass.  Musculoskeletal:        General: No tenderness. Normal range of motion.     Cervical back: Normal range of motion and neck supple.     Right hip: No deformity or lacerations. Normal range of motion.     Left hip: No deformity or lacerations. Normal range of motion.  Skin:    General: Skin is warm and dry.     Coloration: Skin is not pale.     Findings: No erythema or rash.  Neurological:     General: No focal deficit present.     Mental Status: He is alert and oriented to person, place, and time.  Psychiatric:        Mood and Affect: Mood normal.        Behavior: Behavior normal.        Thought Content: Thought content normal.        Judgment: Judgment normal.           Assessment & Plan:  50 year old black man living with diabetes mellitus asplenia who has recent MRSA bacteremia likely with the source being his necrotic second toe which was amputated also with metastatic sites of infection including his thoracic spine and symphysis pubis.  He is completely daptomycin and then change over to twice daily doxycycline will continue for a protracted period of time given his osteo in the symphysis pubis.  Thrush: This is resolved   Asplenia: Need to check that he is up-to-date on vaccines.  I spent greater than 40 minutes with the patient including greater than 50% of time in face to face counsel of the patient has metastatic Staph aureus infection and coordination of  his care with home health.

## 2020-12-22 ENCOUNTER — Encounter: Payer: Self-pay | Admitting: Family Medicine

## 2020-12-22 NOTE — Progress Notes (Signed)
Subjective:   Patient ID: Christopher Fernandez    DOB: 01/19/1971, 50 y.o. male   MRN: [id number]  Christopher Fernandez is a 50 y.o. male with a history of HTN, pericardial effusion, asthma, COPD, h/o biliary sludge, chronic pancreatitis with exocrine pancreatic insufficiency, GERD, thrush, DM with h/o multiple osteomyelitis infections requiring amputation, HLD, neuromuscular disorder, currently treating acute osteomyelitis of spine, pelvic, and thigh region, discitis, BPH, abnormal ABI, action tremor, alcohol use disorder, anemia, anxiety/depression, asplenia, ED, h/o MRSA bacteremia here for diabetes follow up  Diabetes: Last three A1C's below. Currently on Sitagliptin 100mg  QD, Metformin 1000mg  BID, Insulin Glargin 18U qAM and Humalog 6-10U TID (6 units up to 250, 8 units up to 300, 10 units >300), jardiance 25mg  QD. Endorses compliance. Notes CBGs range: 140-180, higher in the last 2 days 230. Unsure why. Notes low sugars 40-60 when using the current sliding scale. Denies any polyuria, polydipsia, polyphagia. Due for diabetic eye exam.  Lab Results  Component Value Date   HGBA1C 8.7 (A) 12/23/2020   HGBA1C 9.7 (A) 10/23/2020   HGBA1C 7.9 (A) 08/19/2020    HTN:  BP: 118/72 today. Not currently on any antihypertensives. Endorses compliance. Current everyday smoker (10 cigs/day and desires to quit). Denies any chest pain, SOB, vision changes, or headaches.   Lab Results  Component Value Date   CREATININE 0.94 12/16/2020   CREATININE 0.81 12/09/2020   CREATININE 0.78 12/09/2020   HLD: Last lipid panel below. Supposed to be taking Atorvastatin 40mg  QD. Not currently on any lipid lowering medications. Endorses compliance. Denies any muscles aches or weakness.   Lab Results  Component Value Date   CHOL 86 (L) 10/23/2020   HDL 34 (L) 10/23/2020   LDLCALC 34 10/23/2020   LDLDIRECT 55 02/24/2018   TRIG 87 10/23/2020   CHOLHDL 2.5 10/23/2020    Review of Systems:  Per HPI.   Objective:   BP  118/72   Pulse 90   Wt 242 lb (109.8 kg)   SpO2 96%   BMI 30.25 kg/m  Vitals and nursing note reviewed.  General: pleasant older male, sitting comfortably in exam chair, well nourished, well developed, in no acute distress with non-toxic appearance CV: regular rate and rhythm without murmurs, rubs, or gallops, no lower extremity edema, 2+ radial and pedal pulses bilaterally Lungs: clear to auscultation bilaterally with normal work of breathing on room air, speaking in full sentences Abdomen: soft, non-tender, non-distended, normoactive bowel sounds Skin: warm, dry Extremities: good ROM of right hip  MSK: pain along right and left flank to palpation bilaterally Neuro: Alert and oriented, speech normal  Assessment & Plan:   Benign essential hypertension Chronic, well controlled. Not currently on antihypertensives. Would benefit from low dose ARB for renal protection however deferred this until acute infection has cleared. Last BMP on 12/2020 was normal.   Uncontrolled diabetes mellitus with hyperglycemia, with long-term current use of insulin (HCC) Chronic, improving. A1C improved from 9.7 to 8.7. Has had some hypoglycemic episodes with current sliding scale. Discussed transitioning from DDP-4 to GLP-1. However given his acute infection and improvement in A1C, opted to hold off on changes. Will adjust sliding scale and follow up in 1 month. Could consider adjusting long acting next.  - decrease Humalog sliding scale: 4 units <250, 6 units for 250-300, 8 units for >300 - continue Glargine 18U qAM - continue jardiance 25mg  QD, Januvia 100mg  QD, metformin 1000mg  BID - follow up 1 month for continued monitoring - discussed addition  of ARB for renal protection. Will defer for 2-3 months to allow acute infection to improve prior to medication changes - goal: switch to GLP-1  Hyperlipidemia associated with type 2 diabetes mellitus (HCC) Chronic. - continue Atorvastatin 40mg  QD  Osteomyelitis  (HCC) Acute. Osteomyelitis in T-spine and pelvis. Currently on 6 week course of Daptomycin with PICC in place. Plan to transition to PO Doxycycline upon completion. Followed closely by ID. Has CK level drawn by home nurse aid weekly as recommended. Will defer blood work today given levels are obtained elsewhere. Repeat MRI planned for 4/13 due to worsening left sided flank pain.  Plan to continue Oxycodone-Acetamenophen 10-325mg  q 6 hours PRN for break through pain + tylenol, ibuprofen, and topical analgesics. PDMP aware reviewed. No red flags.  Orders Placed This Encounter  Procedures  . POCT glycosylated hemoglobin (Hb A1C)   Meds ordered this encounter  Medications  . atorvastatin (LIPITOR) 40 MG tablet    Sig: Take 1 tablet (40 mg total) by mouth daily.    Dispense:  90 tablet    Refill:  3  . oxyCODONE-acetaminophen (PERCOCET) 10-325 MG tablet    Sig: Take 1 tablet by mouth every 6 (six) hours as needed for up to 15 days for pain.    Dispense:  20 tablet    Refill:  0     Mina Marble, DO PGY-3, [facility] 12/24/2020 1:02 PM

## 2020-12-23 ENCOUNTER — Encounter: Payer: Self-pay | Admitting: Family Medicine

## 2020-12-23 ENCOUNTER — Other Ambulatory Visit: Payer: Self-pay

## 2020-12-23 ENCOUNTER — Ambulatory Visit: Payer: 59 | Admitting: Family Medicine

## 2020-12-23 ENCOUNTER — Other Ambulatory Visit (HOSPITAL_COMMUNITY): Payer: Self-pay

## 2020-12-23 VITALS — BP 118/72 | HR 90 | Wt 242.0 lb

## 2020-12-23 DIAGNOSIS — M8618 Other acute osteomyelitis, other site: Secondary | ICD-10-CM

## 2020-12-23 DIAGNOSIS — M86151 Other acute osteomyelitis, right femur: Secondary | ICD-10-CM | POA: Diagnosis not present

## 2020-12-23 DIAGNOSIS — E1169 Type 2 diabetes mellitus with other specified complication: Secondary | ICD-10-CM

## 2020-12-23 DIAGNOSIS — I10 Essential (primary) hypertension: Secondary | ICD-10-CM | POA: Diagnosis not present

## 2020-12-23 DIAGNOSIS — E1165 Type 2 diabetes mellitus with hyperglycemia: Secondary | ICD-10-CM | POA: Diagnosis not present

## 2020-12-23 DIAGNOSIS — Z794 Long term (current) use of insulin: Secondary | ICD-10-CM | POA: Diagnosis not present

## 2020-12-23 DIAGNOSIS — E785 Hyperlipidemia, unspecified: Secondary | ICD-10-CM

## 2020-12-23 LAB — POCT GLYCOSYLATED HEMOGLOBIN (HGB A1C): HbA1c, POC (controlled diabetic range): 8.7 % — AB (ref 0.0–7.0)

## 2020-12-23 MED ORDER — ATORVASTATIN CALCIUM 40 MG PO TABS
40.0000 mg | ORAL_TABLET | Freq: Every day | ORAL | 3 refills | Status: DC
  Filled 2020-12-23 – 2021-04-24 (×2): qty 90, 90d supply, fill #0
  Filled 2021-07-09: qty 90, 90d supply, fill #1

## 2020-12-23 MED ORDER — OXYCODONE-ACETAMINOPHEN 10-325 MG PO TABS
1.0000 | ORAL_TABLET | Freq: Four times a day (QID) | ORAL | 0 refills | Status: DC | PRN
  Filled 2020-12-23: qty 20, 5d supply, fill #0

## 2020-12-23 NOTE — Patient Instructions (Signed)
Continue same diabetes medications: Humalog: 4 units for <250, 6 units for 250-300, and 8 units >300  Follow up in 1 month

## 2020-12-24 DIAGNOSIS — L02611 Cutaneous abscess of right foot: Secondary | ICD-10-CM | POA: Diagnosis not present

## 2020-12-24 DIAGNOSIS — A4902 Methicillin resistant Staphylococcus aureus infection, unspecified site: Secondary | ICD-10-CM | POA: Diagnosis not present

## 2020-12-24 DIAGNOSIS — S98011A Complete traumatic amputation of right foot at ankle level, initial encounter: Secondary | ICD-10-CM | POA: Diagnosis not present

## 2020-12-24 DIAGNOSIS — A327 Listerial sepsis: Secondary | ICD-10-CM | POA: Diagnosis not present

## 2020-12-24 DIAGNOSIS — S78111A Complete traumatic amputation at level between right hip and knee, initial encounter: Secondary | ICD-10-CM | POA: Diagnosis not present

## 2020-12-24 NOTE — Assessment & Plan Note (Addendum)
Chronic, improving. A1C improved from 9.7 to 8.7. Has had some hypoglycemic episodes with current sliding scale. Discussed transitioning from DDP-4 to GLP-1. However given his acute infection and improvement in A1C, opted to hold off on changes. Will adjust sliding scale and follow up in 1 month. Could consider adjusting long acting next.  - decrease Humalog sliding scale: 4 units <250, 6 units for 250-300, 8 units for >300 - continue Glargine 18U qAM - continue jardiance 25mg  QD, Januvia 100mg  QD, metformin 1000mg  BID - follow up 1 month for continued monitoring - discussed addition of ARB for renal protection. Will defer for 2-3 months to allow acute infection to improve prior to medication changes - goal: switch to GLP-1

## 2020-12-24 NOTE — Assessment & Plan Note (Signed)
Chronic, well controlled. Not currently on antihypertensives. Would benefit from low dose ARB for renal protection however deferred this until acute infection has cleared. Last BMP on 12/2020 was normal.

## 2020-12-24 NOTE — Assessment & Plan Note (Signed)
Chronic. - continue Atorvastatin 40mg  QD

## 2020-12-24 NOTE — Assessment & Plan Note (Addendum)
Acute. Osteomyelitis in T-spine and pelvis. Currently on 6 week course of Daptomycin with PICC in place. Plan to transition to PO Doxycycline upon completion. Followed closely by ID. Has CK level drawn by home nurse aid weekly as recommended. Will defer blood work today given levels are obtained elsewhere. Repeat MRI planned for 4/13 due to worsening left sided flank pain.  Plan to continue Oxycodone-Acetamenophen 10-325mg  q 6 hours PRN for break through pain + tylenol, ibuprofen, and topical analgesics. PDMP aware reviewed. No red flags.

## 2020-12-25 ENCOUNTER — Other Ambulatory Visit: Payer: Self-pay

## 2020-12-25 ENCOUNTER — Ambulatory Visit (HOSPITAL_COMMUNITY)
Admission: RE | Admit: 2020-12-25 | Discharge: 2020-12-25 | Disposition: A | Discharge status: Home / Self Care | Payer: 59 | Source: Ambulatory Visit | Attending: Infectious Disease | Admitting: Infectious Disease

## 2020-12-25 DIAGNOSIS — R21 Rash and other nonspecific skin eruption: Secondary | ICD-10-CM | POA: Diagnosis not present

## 2020-12-25 DIAGNOSIS — Q8901 Asplenia (congenital): Secondary | ICD-10-CM | POA: Diagnosis not present

## 2020-12-25 DIAGNOSIS — D649 Anemia, unspecified: Secondary | ICD-10-CM | POA: Diagnosis not present

## 2020-12-25 DIAGNOSIS — M4620 Osteomyelitis of vertebra, site unspecified: Secondary | ICD-10-CM | POA: Diagnosis not present

## 2020-12-25 DIAGNOSIS — G062 Extradural and subdural abscess, unspecified: Secondary | ICD-10-CM | POA: Diagnosis not present

## 2020-12-25 DIAGNOSIS — M4645 Discitis, unspecified, thoracolumbar region: Secondary | ICD-10-CM | POA: Insufficient documentation

## 2020-12-25 DIAGNOSIS — J90 Pleural effusion, not elsewhere classified: Secondary | ICD-10-CM | POA: Diagnosis not present

## 2020-12-25 DIAGNOSIS — R202 Paresthesia of skin: Secondary | ICD-10-CM | POA: Diagnosis not present

## 2020-12-25 DIAGNOSIS — R338 Other retention of urine: Secondary | ICD-10-CM | POA: Diagnosis present

## 2020-12-25 DIAGNOSIS — R0602 Shortness of breath: Secondary | ICD-10-CM | POA: Diagnosis not present

## 2020-12-25 DIAGNOSIS — K5900 Constipation, unspecified: Secondary | ICD-10-CM | POA: Diagnosis not present

## 2020-12-25 DIAGNOSIS — M1611 Unilateral primary osteoarthritis, right hip: Secondary | ICD-10-CM | POA: Diagnosis not present

## 2020-12-25 DIAGNOSIS — S98011A Complete traumatic amputation of right foot at ankle level, initial encounter: Secondary | ICD-10-CM | POA: Diagnosis not present

## 2020-12-25 DIAGNOSIS — K219 Gastro-esophageal reflux disease without esophagitis: Secondary | ICD-10-CM | POA: Diagnosis not present

## 2020-12-25 DIAGNOSIS — E785 Hyperlipidemia, unspecified: Secondary | ICD-10-CM | POA: Diagnosis present

## 2020-12-25 DIAGNOSIS — E1140 Type 2 diabetes mellitus with diabetic neuropathy, unspecified: Secondary | ICD-10-CM | POA: Diagnosis not present

## 2020-12-25 DIAGNOSIS — M4624 Osteomyelitis of vertebra, thoracic region: Secondary | ICD-10-CM | POA: Diagnosis not present

## 2020-12-25 DIAGNOSIS — Z20822 Contact with and (suspected) exposure to covid-19: Secondary | ICD-10-CM | POA: Diagnosis not present

## 2020-12-25 DIAGNOSIS — J9601 Acute respiratory failure with hypoxia: Secondary | ICD-10-CM | POA: Diagnosis not present

## 2020-12-25 DIAGNOSIS — R7881 Bacteremia: Secondary | ICD-10-CM | POA: Diagnosis not present

## 2020-12-25 DIAGNOSIS — G061 Intraspinal abscess and granuloma: Secondary | ICD-10-CM | POA: Diagnosis not present

## 2020-12-25 DIAGNOSIS — R710 Precipitous drop in hematocrit: Secondary | ICD-10-CM | POA: Diagnosis not present

## 2020-12-25 DIAGNOSIS — M8618 Other acute osteomyelitis, other site: Secondary | ICD-10-CM | POA: Diagnosis not present

## 2020-12-25 DIAGNOSIS — A327 Listerial sepsis: Secondary | ICD-10-CM | POA: Diagnosis not present

## 2020-12-25 DIAGNOSIS — A312 Disseminated mycobacterium avium-intracellulare complex (DMAC): Secondary | ICD-10-CM | POA: Diagnosis not present

## 2020-12-25 DIAGNOSIS — N401 Enlarged prostate with lower urinary tract symptoms: Secondary | ICD-10-CM | POA: Diagnosis present

## 2020-12-25 DIAGNOSIS — I96 Gangrene, not elsewhere classified: Secondary | ICD-10-CM | POA: Diagnosis not present

## 2020-12-25 DIAGNOSIS — F32A Depression, unspecified: Secondary | ICD-10-CM | POA: Diagnosis present

## 2020-12-25 DIAGNOSIS — M25551 Pain in right hip: Secondary | ICD-10-CM | POA: Diagnosis not present

## 2020-12-25 DIAGNOSIS — E46 Unspecified protein-calorie malnutrition: Secondary | ICD-10-CM | POA: Diagnosis present

## 2020-12-25 DIAGNOSIS — K861 Other chronic pancreatitis: Secondary | ICD-10-CM | POA: Diagnosis not present

## 2020-12-25 DIAGNOSIS — S98319A Complete traumatic amputation of unspecified midfoot, initial encounter: Secondary | ICD-10-CM | POA: Diagnosis not present

## 2020-12-25 DIAGNOSIS — E1169 Type 2 diabetes mellitus with other specified complication: Secondary | ICD-10-CM | POA: Diagnosis not present

## 2020-12-25 DIAGNOSIS — Z01818 Encounter for other preprocedural examination: Secondary | ICD-10-CM | POA: Diagnosis not present

## 2020-12-25 DIAGNOSIS — G2581 Restless legs syndrome: Secondary | ICD-10-CM | POA: Diagnosis present

## 2020-12-25 DIAGNOSIS — Z9689 Presence of other specified functional implants: Secondary | ICD-10-CM | POA: Diagnosis not present

## 2020-12-25 DIAGNOSIS — R918 Other nonspecific abnormal finding of lung field: Secondary | ICD-10-CM | POA: Diagnosis not present

## 2020-12-25 DIAGNOSIS — L02611 Cutaneous abscess of right foot: Secondary | ICD-10-CM | POA: Diagnosis not present

## 2020-12-25 DIAGNOSIS — A4902 Methicillin resistant Staphylococcus aureus infection, unspecified site: Secondary | ICD-10-CM | POA: Diagnosis not present

## 2020-12-25 DIAGNOSIS — B9562 Methicillin resistant Staphylococcus aureus infection as the cause of diseases classified elsewhere: Secondary | ICD-10-CM | POA: Diagnosis not present

## 2020-12-25 DIAGNOSIS — L089 Local infection of the skin and subcutaneous tissue, unspecified: Secondary | ICD-10-CM | POA: Diagnosis not present

## 2020-12-25 DIAGNOSIS — M86151 Other acute osteomyelitis, right femur: Secondary | ICD-10-CM | POA: Diagnosis not present

## 2020-12-25 DIAGNOSIS — G992 Myelopathy in diseases classified elsewhere: Secondary | ICD-10-CM | POA: Diagnosis not present

## 2020-12-25 DIAGNOSIS — E1142 Type 2 diabetes mellitus with diabetic polyneuropathy: Secondary | ICD-10-CM | POA: Diagnosis present

## 2020-12-25 DIAGNOSIS — M4716 Other spondylosis with myelopathy, lumbar region: Secondary | ICD-10-CM | POA: Diagnosis not present

## 2020-12-25 DIAGNOSIS — J449 Chronic obstructive pulmonary disease, unspecified: Secondary | ICD-10-CM | POA: Diagnosis not present

## 2020-12-25 DIAGNOSIS — D62 Acute posthemorrhagic anemia: Secondary | ICD-10-CM | POA: Diagnosis not present

## 2020-12-25 DIAGNOSIS — A419 Sepsis, unspecified organism: Secondary | ICD-10-CM | POA: Diagnosis not present

## 2020-12-25 DIAGNOSIS — L929 Granulomatous disorder of the skin and subcutaneous tissue, unspecified: Secondary | ICD-10-CM | POA: Diagnosis not present

## 2020-12-25 DIAGNOSIS — G0489 Other myelitis: Secondary | ICD-10-CM | POA: Diagnosis not present

## 2020-12-25 DIAGNOSIS — J9811 Atelectasis: Secondary | ICD-10-CM | POA: Diagnosis not present

## 2020-12-25 DIAGNOSIS — M869 Osteomyelitis, unspecified: Secondary | ICD-10-CM | POA: Diagnosis not present

## 2020-12-25 DIAGNOSIS — M4644 Discitis, unspecified, thoracic region: Secondary | ICD-10-CM | POA: Diagnosis not present

## 2020-12-25 DIAGNOSIS — Z9889 Other specified postprocedural states: Secondary | ICD-10-CM | POA: Diagnosis not present

## 2020-12-25 DIAGNOSIS — J439 Emphysema, unspecified: Secondary | ICD-10-CM | POA: Diagnosis not present

## 2020-12-25 DIAGNOSIS — M545 Low back pain, unspecified: Secondary | ICD-10-CM | POA: Diagnosis not present

## 2020-12-25 DIAGNOSIS — D75839 Thrombocytosis, unspecified: Secondary | ICD-10-CM | POA: Diagnosis not present

## 2020-12-25 DIAGNOSIS — M4804 Spinal stenosis, thoracic region: Secondary | ICD-10-CM | POA: Diagnosis not present

## 2020-12-25 DIAGNOSIS — I10 Essential (primary) hypertension: Secondary | ICD-10-CM | POA: Diagnosis present

## 2020-12-25 DIAGNOSIS — R600 Localized edema: Secondary | ICD-10-CM | POA: Diagnosis not present

## 2020-12-25 DIAGNOSIS — N179 Acute kidney failure, unspecified: Secondary | ICD-10-CM | POA: Diagnosis not present

## 2020-12-25 DIAGNOSIS — M1990 Unspecified osteoarthritis, unspecified site: Secondary | ICD-10-CM | POA: Diagnosis present

## 2020-12-25 DIAGNOSIS — M40209 Unspecified kyphosis, site unspecified: Secondary | ICD-10-CM | POA: Diagnosis not present

## 2020-12-25 DIAGNOSIS — R000 Tachycardia, unspecified: Secondary | ICD-10-CM | POA: Diagnosis not present

## 2020-12-25 DIAGNOSIS — Z794 Long term (current) use of insulin: Secondary | ICD-10-CM | POA: Diagnosis not present

## 2020-12-25 DIAGNOSIS — J80 Acute respiratory distress syndrome: Secondary | ICD-10-CM | POA: Diagnosis not present

## 2020-12-25 MED ORDER — GADOBUTROL 1 MMOL/ML IV SOLN
10.0000 mL | Freq: Once | INTRAVENOUS | Status: AC | PRN
  Administered 2020-12-25: 10 mL via INTRAVENOUS

## 2020-12-26 ENCOUNTER — Other Ambulatory Visit (HOSPITAL_COMMUNITY): Payer: Self-pay | Admitting: Infectious Diseases

## 2020-12-26 ENCOUNTER — Other Ambulatory Visit: Payer: Self-pay

## 2020-12-26 ENCOUNTER — Telehealth: Payer: Self-pay

## 2020-12-26 ENCOUNTER — Ambulatory Visit: Payer: 59 | Admitting: Infectious Diseases

## 2020-12-26 ENCOUNTER — Encounter: Payer: Self-pay | Admitting: Infectious Diseases

## 2020-12-26 DIAGNOSIS — M4645 Discitis, unspecified, thoracolumbar region: Secondary | ICD-10-CM | POA: Diagnosis not present

## 2020-12-26 DIAGNOSIS — M8610 Other acute osteomyelitis, unspecified site: Secondary | ICD-10-CM

## 2020-12-26 DIAGNOSIS — M25551 Pain in right hip: Secondary | ICD-10-CM | POA: Diagnosis not present

## 2020-12-26 DIAGNOSIS — R7881 Bacteremia: Secondary | ICD-10-CM

## 2020-12-26 DIAGNOSIS — B9562 Methicillin resistant Staphylococcus aureus infection as the cause of diseases classified elsewhere: Secondary | ICD-10-CM | POA: Diagnosis not present

## 2020-12-26 NOTE — Assessment & Plan Note (Addendum)
Previously on MRI 3+ weeks ago there was only evidence of discitis and osteomyelitis at T 9-T10 without epidural abscess, there was some subtle enhancement at the time but no surgical indications that would warrant immediate neurosurgical evaluation.  Unfortunately despite appropriate anabiotic therapy his condition has worsened with progression of osteomyelitis to contiguous vertebra at T8 and T11 with circumfrential epidural abscess/phlegmon at T9/T11. Fortunately he has no neurologic deterioration on exam today and no findings concerning for sepsis. His pain is not controlled and he appears very uncomfortable - he is beading with sweat today during exam that is related to uncontrolled pain and spasm.   We dicussed options and both agreed that Emergency Room referral for admission, pain control, neurologic monitoring and neurosurgical evaluation would be the best course given his worsening condition. He will go home to discuss with his wife and likely come to ER tomorrow. We have discussed precautions that would warrant earlier and emergent referral to ER via EMS in the event he has neurologic deterioration that could lead to and include paralysis at the level of infection/abscess. He understands and rightfully fearful over this.   Will extend out his antibiotics another 4-6 weeks but he will need neurosurgery to evaluate for surgical intervention given progression of his condition despite appropriate antibiotic therapy targeting MRSA. I updated Dr. Tommy Medal who will see Christopher Fernandez at arrival to [facility] tomorrow.

## 2020-12-26 NOTE — Telephone Encounter (Signed)
Reviewed and conversation in result note

## 2020-12-26 NOTE — Assessment & Plan Note (Signed)
This is been overall improving up until the last 24 hours.  He reports some increased weakness perceived in the right leg.  He has good strength bilaterally with hip flexors.  May be slight decrease in the right versus left.  No pain provoked during physical exam.

## 2020-12-26 NOTE — Patient Instructions (Signed)
Please go over to [facility] Emergency Room as we discussed to be admitted to the hospital for ongoing IV antibiotics, a neurosurgery to see you and for pain control.   IF you notice any changes with worsening weakness, numbness, decreased sensation, falling, no control over your urine or bowels or the pain is just too bad to bear I want you to go over to the ER right away.   I will notify our inpatient team at [facility] for your arrival so we can continue to take care of you while you go back to the hospital.

## 2020-12-26 NOTE — Assessment & Plan Note (Addendum)
He was found to be bacteremic with new onset discitis during previous admission - cleared quickly.  No findings today concerning for ongoing bacteremia.  He asked me if he is on the correct antibiotics.  Given the fact that he was bacteremic with a predominating organism there is no concern for an alternative pathogen and he should be well covered with the daptomycin.  This is a source control problem with a virulent organism.

## 2020-12-26 NOTE — Progress Notes (Addendum)
Patient: Christopher Fernandez  DOB: 10/06/1970 MRN: [id number] PCP: Danna Hefty, DO   Subjective   Christopher Fernandez is a 50 y.o. 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  Christopher Fernandez was admitted to the hospital on March 18 through December 10, 2020 for worsening back pain.  He was found to have MRSA bacteremia with osteomyelitis and discitis at T9-T10.  Epidural and paraspinal soft tissue inflammation was noted but no abscess at the time.  He also reported right hip pain - MRI revealed concern for early osteomyelitis of the superior pubic ramus with edema and enhancement to the right abductor muscle concerning for myositis repeated exam on 3/29 showed improvement in the myositis but worsening of pelvic ramus osteomyelitis at that time.   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  He was treated with daptomycin IV and discharged home.  He was seen in the office on April 8 and follow-up with Dr. Tommy Medal.  He was having worsening back pain at that time.  A repeat thoracic spine MRI was ordered and performed today.  Report was called to Dr. Drucilla Schmidt and revealed progressive changes of osteomyelitis and discitis including now enhancement to the contiguous vertebra at T8 and T11.  There is also a new circumferential epidural abscess/phlegmon at the T9 T11 level.  In light of the worsening MRI results he was asked to come to the office to be evaluated in person for urgent admission and surgical consultation.  He states that he has had significant worsening of his pain, so much so that he breaks out into sweats.  He knows his team is trying to manage the pain but nothing really seems to take great care of it.  He is not having any fevers or chills were feelings of systemic illness.  Last night he has noticed some return in weakness of the right leg.  This initially was getting better.  He reports no  falls at home, no abnormalities in gait.  He has full complete sensation in bilateral lower extremities.  He just feels overall weak with a poor appetite and weight loss of about 30 pounds with 15 over the last 1 month.  He has full control of bowels and bladder with no changes from baseline.    PICC line is without pain, drainage or erythema and is well maintained by [facility] Team. No swelling or altered sensation in affected distal extremity.   Review of Systems  Constitutional: Negative for chills and fever.  HENT: Negative for tinnitus.   Eyes: Negative for blurred vision and photophobia.  Respiratory: Negative for cough and sputum production.   Cardiovascular: Negative for chest pain.  Gastrointestinal: Negative for diarrhea, nausea and vomiting.  Genitourinary: Negative for dysuria.  Musculoskeletal: Positive for back pain. Negative for falls.  Skin: Negative for rash.  Neurological: Positive for weakness. Negative for dizziness, tingling, tremors, focal weakness and headaches.    Past Medical History:  Diagnosis Date  . Alcoholism ([place])   . Anemia   . Anxiety   . Arthritis   . Asthma   . Blood transfusion without reported diagnosis   . COPD (chronic obstructive pulmonary disease) ([place])   . Depression   . Diabetes ([place])    type 2  . GERD (gastroesophageal reflux disease)   . History of hiatal hernia   . HTN (hypertension)   .  MRSA bacteremia   . MRSA infection 01/11/2020  . Neuromuscular disorder ([facility])    tremors  . Osteomyelitis ([place])    right forefoot  . Pancreatitis   . Pneumonia   . Substance abuse ([place])   . 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 Wears glasses     Outpatient Medications Prior to Visit  Medication Sig Dispense Refill  . acetaminophen (TYLENOL 8 HOUR) 650 MG CR tablet Take 1 tablet (650 mg total) by mouth every 8 (eight) hours as needed for pain.    [name] albuterol (VENTOLIN HFA) 108 (90 Base) MCG/ACT inhaler INHALE 2 TO 4 PUFFS INTO THE LUNGS  EVERY 4 HOURS AS NEEDED FOR WHEEZING OR COUGH. 18 g 11  . ARIPiprazole (ABILIFY) 5 MG tablet TAKE 1 TABLET (5 MG TOTAL) BY MOUTH DAILY. (Patient taking differently: Take 5 mg by mouth every evening.) 90 tablet 2  . atorvastatin (LIPITOR) 40 MG tablet Take 1 tablet (40 mg total) by mouth daily. 90 tablet 3  . Baclofen 5 MG TABS TAKE 1 TABLET BY MOUTH 2 TIMES DAILY AS NEEDED 60 tablet 0  . bismuth subsalicylate (PEPTO BISMOL) 262 MG/15ML suspension Take 30 mLs by mouth every 6 (six) hours as needed for indigestion or diarrhea or loose stools.    . cetirizine (ZYRTEC) 10 MG tablet Take 1 tablet (10 mg total) by mouth daily. (Patient taking differently: Take 10 mg by mouth daily as needed for allergies.) 30 tablet 11  . Continuous Blood Gluc Receiver (DEXCOM G6 RECEIVER) DEVI 1 Device by Does not apply route 4 (four) times daily. 1 each 11  . Continuous Blood Gluc Transmit (DEXCOM G6 TRANSMITTER) MISC 4 (four) times daily. as directed    . daptomycin (CUBICIN) IVPB Inject 1,000 mg into the vein daily. Indication:  Osteomyelitis First Dose: No Last Day of Therapy:  01/29/2021 Labs - Once weekly:  CBC/D, BMP, and CPK Labs - Every other week:  ESR and CRP Method of administration: IV Push Method of administration may be changed at the discretion of home infusion pharmacist based upon assessment of the patient and/or caregiver's ability to self-administer the medication ordered. 55 Units 0  . DULoxetine (CYMBALTA) 60 MG capsule TAKE 1 CAPSULE (60 MG TOTAL) BY MOUTH DAILY. 90 capsule 2  . EASY TOUCH PEN NEEDLES 31G X 5 MM MISC Inject 1 application as directed 3 (three) times daily. 100 each 11  . empagliflozin (JARDIANCE) 25 MG TABS tablet TAKE 1 TABLET BY MOUTH DAILY. 90 tablet 3  . fluticasone-salmeterol (ADVAIR HFA) 115-21 MCG/ACT inhaler Inhale 1 puff into the lungs 2 (two) times daily.    . Fluticasone-Salmeterol (ADVAIR) 250-50 MCG/DOSE AEPB INHALE 1 PUFF INTO THE LUNGS 2 TIMES DAILY. (Patient taking  differently: Inhale 1 puff into the lungs in the morning and at bedtime.) 180 each 3  . gabapentin (NEURONTIN) 300 MG capsule TAKE 2 CAPSULES (600 MG TOTAL) BY MOUTH 3 (THREE) TIMES DAILY. 180 capsule 2  . glucose blood test strip USE AS INSTRUCTED    . ibuprofen (ADVIL) 400 MG tablet TAKE 1 TABLET (400 MG TOTAL) BY MOUTH 3 (THREE) TIMES DAILY. 30 tablet 0  . Insulin Glargine-yfgn 100 UNIT/ML SOPN INJECT 18 UNITS INTO THE SKIN IN THE MORNING. 15 mL 1  . insulin lispro (HUMALOG) 100 UNIT/ML KwikPen INJECT 6-10 UNITS INTO THE SKIN 3 (THREE) TIMES DAILY. 15 mL 11  . Lancets (FREESTYLE) lancets Use as instructed 100 each 12  . lidocaine (LIDODERM) 5 % PLACE  1 PATCH ONTO THE SKIN DAILY. REMOVE & DISCARD PATCH WITHIN 12 HOURS OR AS DIRECTED BY MD (Patient taking differently: 1 patch daily. Remove & discard patch within 12 hours or as directed by MD) 30 patch 0  . metFORMIN (GLUCOPHAGE) 1000 MG tablet TAKE 1 TABLET (1,000 MG TOTAL) BY MOUTH 2 (TWO) TIMES DAILY WITH A MEAL. 180 tablet 3  . mirtazapine (REMERON) 30 MG tablet TAKE 1 TABLET (30 MG TOTAL) BY MOUTH AT BEDTIME. 90 tablet 3  . montelukast (SINGULAIR) 10 MG tablet TAKE 1 TABLET (10 MG TOTAL) BY MOUTH AT BEDTIME. (Patient taking differently: Take 10 mg by mouth in the morning.) 90 tablet 11  . oxyCODONE-acetaminophen (PERCOCET) 10-325 MG tablet Take 1 tablet by mouth every 6 (six) hours as needed for up to 15 days for pain. 20 tablet 0  . PANCRELIPASE, LIP-PROT-AMYL, PO Take 36,000 Units by mouth with breakfast, with lunch, and with evening meal.    . pantoprazole (PROTONIX) 40 MG tablet TAKE 1 TABLET (40 MG TOTAL) BY MOUTH 2 (TWO) TIMES DAILY. 60 tablet 0  . propranolol ER (INDERAL LA) 80 MG 24 hr capsule TAKE 1 CAPSULE (80 MG TOTAL) BY MOUTH DAILY. 90 capsule 1  . sitaGLIPtin (JANUVIA) 100 MG tablet TAKE 1 TABLET (100 MG TOTAL) BY MOUTH DAILY. 90 tablet 10  . doxycycline (VIBRA-TABS) 100 MG tablet Take 1 tablet (100 mg total) by mouth 2 (two) times  daily. To start after finishing IV antibiotics. (Patient not taking: Reported on 12/26/2020) 60 tablet 11  . fluconazole (DIFLUCAN) 200 MG tablet TAKE 1 TABLET (200 MG TOTAL) BY MOUTH DAILY FOR 7 DAYS. (Patient not taking: Reported on 12/26/2020) 7 tablet 0  . glucose blood test strip USE AS INSTRUCTED    . glucose blood test strip USE AS INSTRUCTED 100 strip 12  . nystatin (MYCOSTATIN) 100000 UNIT/ML suspension TAKE 5 MLS BY MOUTH 4 (FOUR) TIMES DAILY FOR 10 DAYS. (Patient not taking: Reported on 12/26/2020) 200 mL 0   No facility-administered medications prior to visit.     Allergies  Allergen Reactions  . Eggs Or Egg-Derived Products Rash  . Morphine And Related Other (See Comments)    Cant take because of pancreatitis  . Cocoa Rash    Social History   Tobacco Use  . Smoking status: Current Every Day Smoker    Packs/day: 0.30    Years: 38.00    Pack years: 11.40    Types: Cigarettes    Start date: 09/14/1982  . Smokeless tobacco: Never Used  Vaping Use  . Vaping Use: Never used  Substance Use Topics  . Alcohol use: Not Currently    Comment: 2-3 per day, none since 2019  . Drug use: Not Currently    Family History  Problem Relation Age of Onset  . Diabetes Mother   . Hypertension Mother   . Hyperlipidemia Mother   . Kidney disease Mother   . Thyroid disease Mother   . Breast cancer Mother        mets  . Lung cancer Mother   . Diabetes Father   . Alcohol abuse Sister   . Sickle cell trait Sister   . Diabetes Brother   . Asthma Brother   . Lung cancer Maternal Grandmother   . Kidney disease Maternal Grandmother   . Liver cancer Maternal Uncle        x 4-5  . Kidney disease Maternal Uncle   . Kidney disease Paternal Uncle   . Colon  cancer Neg Hx   . Colon polyps Neg Hx   . Esophageal cancer Neg Hx   . Rectal cancer Neg Hx   . Stomach cancer Neg Hx        Objective    Vitals:   12/26/20 1604  BP: 120/86  Pulse: 91  Temp: 98.2 F (36.8 C)  TempSrc:  Oral  SpO2: 97%  Weight: 235 lb (106.6 kg)  Height: 6' 3" (1.905 m)   Body mass index is 29.37 kg/m.  Physical Exam HENT:     Mouth/Throat:     Mouth: No oral lesions.     Dentition: Normal dentition. No dental caries.  Eyes:     General: No scleral icterus. Cardiovascular:     Rate and Rhythm: Normal rate and regular rhythm.     Heart sounds: Normal heart sounds.  Pulmonary:     Effort: Pulmonary effort is normal.     Breath sounds: Normal breath sounds.  Abdominal:     General: There is no distension.     Palpations: Abdomen is soft.     Tenderness: There is no abdominal tenderness.  Musculoskeletal:        General: Tenderness (over thoracic spine) present. No swelling.  Lymphadenopathy:     Cervical: No cervical adenopathy.  Skin:    General: Skin is warm and dry.     Findings: No rash.  Neurological:     General: No focal deficit present.     Mental Status: He is alert and oriented to person, place, and time.     Cranial Nerves: Cranial nerves are intact.     Sensory: Sensation is intact.     Motor: Motor function is intact. No weakness or abnormal muscle tone.     Gait: Gait is intact.     Comments: He uses a cane for ambulation.  He is able to raise both knees in a flexed position to chest.  Very slight difference with some right-sided weakness compared to the left.  Otherwise he has 5+ strength bilaterally.  Equal and adequate sensation bilaterally.   PICC line - clean/dry dressing. Insertion site w/o erythema, tenderness, drainage, cording or distal swelling of affected extremity    Lab Results: Lab Results  Component Value Date   WBC 7.9 12/09/2020   HGB 9.4 (L) 12/09/2020   HCT 29.2 (L) 12/09/2020   MCV 87.7 12/09/2020   PLT 553 (H) 12/09/2020    Lab Results  Component Value Date   CREATININE 0.94 12/16/2020   BUN 9 12/16/2020   NA 136 12/16/2020   K 4.9 12/16/2020   CL 98 12/16/2020   CO2 21 12/16/2020    Lab Results  Component Value Date    ALT 39 12/16/2020   AST 13 12/16/2020   ALKPHOS 201 (H) 12/16/2020   BILITOT 0.2 12/16/2020        Assessment & Plan:   Problem List Items Addressed This Visit      Unprioritized   Right hip pain    This is been overall improving up until the last 24 hours.  He reports some increased weakness perceived in the right leg.  He has good strength bilaterally with hip flexors.  May be slight decrease in the right versus left.  No pain provoked during physical exam.      MRSA bacteremia    He was found to be bacteremic with new onset discitis during previous admission - cleared quickly.  No findings today concerning for ongoing bacteremia.  He asked me if he is on the correct antibiotics.  Given the fact that he was bacteremic with a predominating organism there is no concern for an alternative pathogen and he should be well covered with the daptomycin.  This is a source control problem with a virulent organism.      Discitis of thoracolumbar region    Previously on MRI 3+ weeks ago there was only evidence of discitis and osteomyelitis at T 9-T10 without epidural abscess, there was some subtle enhancement at the time but no surgical indications that would warrant immediate neurosurgical evaluation.  Unfortunately despite appropriate anabiotic therapy his condition has worsened with progression of osteomyelitis to contiguous vertebra at T8 and T11 with circumfrential epidural abscess/phlegmon at T9/T11. Fortunately he has no neurologic deterioration on exam today and no findings concerning for sepsis. His pain is not controlled and he appears very uncomfortable - he is beading with sweat today during exam that is related to uncontrolled pain and spasm.   We dicussed options and both agreed that Emergency Room referral for admission, pain control, neurologic monitoring and neurosurgical evaluation would be the best course given his worsening condition. He will go home to discuss with his wife and  likely come to ER tomorrow. We have discussed precautions that would warrant earlier and emergent referral to ER via EMS in the event he has neurologic deterioration that could lead to and include paralysis at the level of infection/abscess. He understands and rightfully fearful over this.   Will extend out his antibiotics another 4-6 weeks but he will need neurosurgery to evaluate for surgical intervention given progression of his condition despite appropriate antibiotic therapy targeting MRSA. I updated Dr. Tommy Medal who will see Christopher Fernandez at arrival to [facility] tomorrow.          Janene Madeira, MSN, NP-C [facility] Pager: [phone] Office: [phone]  12/26/20  10:09 PM

## 2020-12-26 NOTE — Telephone Encounter (Signed)
Received call from [facility] imaging calling to report MRI results available in epic. Routing to MD for review. Page also sent to MD Eugenia Mcalpine

## 2020-12-27 ENCOUNTER — Observation Stay (HOSPITAL_COMMUNITY): Payer: 59

## 2020-12-27 ENCOUNTER — Emergency Department (HOSPITAL_COMMUNITY): Payer: 59

## 2020-12-27 ENCOUNTER — Ambulatory Visit (HOSPITAL_COMMUNITY): Payer: 59

## 2020-12-27 ENCOUNTER — Ambulatory Visit: Payer: Self-pay | Admitting: *Deleted

## 2020-12-27 ENCOUNTER — Encounter (HOSPITAL_COMMUNITY): Payer: Self-pay

## 2020-12-27 ENCOUNTER — Inpatient Hospital Stay (HOSPITAL_COMMUNITY)
Admission: EM | Admit: 2020-12-27 | Discharge: 2021-01-17 | DRG: 628 | Disposition: A | Discharge status: Home Health | Payer: 59 | Attending: Family Medicine | Admitting: Family Medicine

## 2020-12-27 DIAGNOSIS — Z9049 Acquired absence of other specified parts of digestive tract: Secondary | ICD-10-CM

## 2020-12-27 DIAGNOSIS — Z833 Family history of diabetes mellitus: Secondary | ICD-10-CM

## 2020-12-27 DIAGNOSIS — Z825 Family history of asthma and other chronic lower respiratory diseases: Secondary | ICD-10-CM

## 2020-12-27 DIAGNOSIS — M4624 Osteomyelitis of vertebra, thoracic region: Secondary | ICD-10-CM | POA: Diagnosis present

## 2020-12-27 DIAGNOSIS — D62 Acute posthemorrhagic anemia: Secondary | ICD-10-CM | POA: Diagnosis not present

## 2020-12-27 DIAGNOSIS — E46 Unspecified protein-calorie malnutrition: Secondary | ICD-10-CM | POA: Diagnosis present

## 2020-12-27 DIAGNOSIS — M869 Osteomyelitis, unspecified: Secondary | ICD-10-CM

## 2020-12-27 DIAGNOSIS — R0602 Shortness of breath: Secondary | ICD-10-CM

## 2020-12-27 DIAGNOSIS — J90 Pleural effusion, not elsewhere classified: Secondary | ICD-10-CM

## 2020-12-27 DIAGNOSIS — Z8249 Family history of ischemic heart disease and other diseases of the circulatory system: Secondary | ICD-10-CM

## 2020-12-27 DIAGNOSIS — A327 Listerial sepsis: Secondary | ICD-10-CM | POA: Diagnosis not present

## 2020-12-27 DIAGNOSIS — A4902 Methicillin resistant Staphylococcus aureus infection, unspecified site: Secondary | ICD-10-CM

## 2020-12-27 DIAGNOSIS — M4644 Discitis, unspecified, thoracic region: Secondary | ICD-10-CM | POA: Diagnosis not present

## 2020-12-27 DIAGNOSIS — Z811 Family history of alcohol abuse and dependence: Secondary | ICD-10-CM

## 2020-12-27 DIAGNOSIS — Z96651 Presence of right artificial knee joint: Secondary | ICD-10-CM | POA: Diagnosis present

## 2020-12-27 DIAGNOSIS — Z885 Allergy status to narcotic agent status: Secondary | ICD-10-CM

## 2020-12-27 DIAGNOSIS — Z20822 Contact with and (suspected) exposure to covid-19: Secondary | ICD-10-CM | POA: Diagnosis present

## 2020-12-27 DIAGNOSIS — R338 Other retention of urine: Secondary | ICD-10-CM | POA: Diagnosis present

## 2020-12-27 DIAGNOSIS — Z9689 Presence of other specified functional implants: Secondary | ICD-10-CM

## 2020-12-27 DIAGNOSIS — J4520 Mild intermittent asthma, uncomplicated: Secondary | ICD-10-CM | POA: Diagnosis present

## 2020-12-27 DIAGNOSIS — B9562 Methicillin resistant Staphylococcus aureus infection as the cause of diseases classified elsewhere: Secondary | ICD-10-CM | POA: Diagnosis not present

## 2020-12-27 DIAGNOSIS — R710 Precipitous drop in hematocrit: Secondary | ICD-10-CM

## 2020-12-27 DIAGNOSIS — E1169 Type 2 diabetes mellitus with other specified complication: Secondary | ICD-10-CM | POA: Diagnosis not present

## 2020-12-27 DIAGNOSIS — D649 Anemia, unspecified: Secondary | ICD-10-CM

## 2020-12-27 DIAGNOSIS — G062 Extradural and subdural abscess, unspecified: Secondary | ICD-10-CM | POA: Diagnosis not present

## 2020-12-27 DIAGNOSIS — F419 Anxiety disorder, unspecified: Secondary | ICD-10-CM | POA: Diagnosis present

## 2020-12-27 DIAGNOSIS — F32A Depression, unspecified: Secondary | ICD-10-CM | POA: Diagnosis present

## 2020-12-27 DIAGNOSIS — K219 Gastro-esophageal reflux disease without esophagitis: Secondary | ICD-10-CM | POA: Diagnosis present

## 2020-12-27 DIAGNOSIS — Z419 Encounter for procedure for purposes other than remedying health state, unspecified: Secondary | ICD-10-CM

## 2020-12-27 DIAGNOSIS — F17210 Nicotine dependence, cigarettes, uncomplicated: Secondary | ICD-10-CM | POA: Diagnosis present

## 2020-12-27 DIAGNOSIS — N179 Acute kidney failure, unspecified: Secondary | ICD-10-CM | POA: Diagnosis not present

## 2020-12-27 DIAGNOSIS — G2581 Restless legs syndrome: Secondary | ICD-10-CM | POA: Diagnosis present

## 2020-12-27 DIAGNOSIS — J9811 Atelectasis: Secondary | ICD-10-CM | POA: Diagnosis not present

## 2020-12-27 DIAGNOSIS — Z83438 Family history of other disorder of lipoprotein metabolism and other lipidemia: Secondary | ICD-10-CM

## 2020-12-27 DIAGNOSIS — A419 Sepsis, unspecified organism: Secondary | ICD-10-CM | POA: Diagnosis not present

## 2020-12-27 DIAGNOSIS — Q8901 Asplenia (congenital): Secondary | ICD-10-CM

## 2020-12-27 DIAGNOSIS — N401 Enlarged prostate with lower urinary tract symptoms: Secondary | ICD-10-CM | POA: Diagnosis present

## 2020-12-27 DIAGNOSIS — K5900 Constipation, unspecified: Secondary | ICD-10-CM

## 2020-12-27 DIAGNOSIS — Z841 Family history of disorders of kidney and ureter: Secondary | ICD-10-CM

## 2020-12-27 DIAGNOSIS — Z7984 Long term (current) use of oral hypoglycemic drugs: Secondary | ICD-10-CM

## 2020-12-27 DIAGNOSIS — G061 Intraspinal abscess and granuloma: Secondary | ICD-10-CM | POA: Diagnosis not present

## 2020-12-27 DIAGNOSIS — E1142 Type 2 diabetes mellitus with diabetic polyneuropathy: Secondary | ICD-10-CM | POA: Diagnosis present

## 2020-12-27 DIAGNOSIS — M4716 Other spondylosis with myelopathy, lumbar region: Secondary | ICD-10-CM | POA: Diagnosis present

## 2020-12-27 DIAGNOSIS — M4620 Osteomyelitis of vertebra, site unspecified: Secondary | ICD-10-CM

## 2020-12-27 DIAGNOSIS — Z09 Encounter for follow-up examination after completed treatment for conditions other than malignant neoplasm: Secondary | ICD-10-CM

## 2020-12-27 DIAGNOSIS — Z6829 Body mass index (BMI) 29.0-29.9, adult: Secondary | ICD-10-CM

## 2020-12-27 DIAGNOSIS — M40209 Unspecified kyphosis, site unspecified: Secondary | ICD-10-CM | POA: Diagnosis not present

## 2020-12-27 DIAGNOSIS — I959 Hypotension, unspecified: Secondary | ICD-10-CM | POA: Diagnosis not present

## 2020-12-27 DIAGNOSIS — E1165 Type 2 diabetes mellitus with hyperglycemia: Secondary | ICD-10-CM

## 2020-12-27 DIAGNOSIS — M8618 Other acute osteomyelitis, other site: Secondary | ICD-10-CM | POA: Diagnosis not present

## 2020-12-27 DIAGNOSIS — L02611 Cutaneous abscess of right foot: Secondary | ICD-10-CM | POA: Diagnosis not present

## 2020-12-27 DIAGNOSIS — Z7951 Long term (current) use of inhaled steroids: Secondary | ICD-10-CM

## 2020-12-27 DIAGNOSIS — R7881 Bacteremia: Secondary | ICD-10-CM

## 2020-12-27 DIAGNOSIS — Z800 Family history of malignant neoplasm of digestive organs: Secondary | ICD-10-CM

## 2020-12-27 DIAGNOSIS — Z832 Family history of diseases of the blood and blood-forming organs and certain disorders involving the immune mechanism: Secondary | ICD-10-CM

## 2020-12-27 DIAGNOSIS — M1990 Unspecified osteoarthritis, unspecified site: Secondary | ICD-10-CM | POA: Diagnosis present

## 2020-12-27 DIAGNOSIS — D75839 Thrombocytosis, unspecified: Secondary | ICD-10-CM

## 2020-12-27 DIAGNOSIS — R202 Paresthesia of skin: Secondary | ICD-10-CM

## 2020-12-27 DIAGNOSIS — E785 Hyperlipidemia, unspecified: Secondary | ICD-10-CM | POA: Diagnosis present

## 2020-12-27 DIAGNOSIS — M4804 Spinal stenosis, thoracic region: Secondary | ICD-10-CM | POA: Diagnosis not present

## 2020-12-27 DIAGNOSIS — E1140 Type 2 diabetes mellitus with diabetic neuropathy, unspecified: Secondary | ICD-10-CM

## 2020-12-27 DIAGNOSIS — J9601 Acute respiratory failure with hypoxia: Secondary | ICD-10-CM | POA: Diagnosis not present

## 2020-12-27 DIAGNOSIS — K861 Other chronic pancreatitis: Secondary | ICD-10-CM | POA: Diagnosis present

## 2020-12-27 DIAGNOSIS — S98011A Complete traumatic amputation of right foot at ankle level, initial encounter: Secondary | ICD-10-CM | POA: Diagnosis not present

## 2020-12-27 DIAGNOSIS — Z8349 Family history of other endocrine, nutritional and metabolic diseases: Secondary | ICD-10-CM

## 2020-12-27 DIAGNOSIS — Z91018 Allergy to other foods: Secondary | ICD-10-CM

## 2020-12-27 DIAGNOSIS — Z794 Long term (current) use of insulin: Secondary | ICD-10-CM

## 2020-12-27 DIAGNOSIS — Z9889 Other specified postprocedural states: Secondary | ICD-10-CM

## 2020-12-27 DIAGNOSIS — Z91012 Allergy to eggs: Secondary | ICD-10-CM

## 2020-12-27 DIAGNOSIS — Z803 Family history of malignant neoplasm of breast: Secondary | ICD-10-CM

## 2020-12-27 DIAGNOSIS — I10 Essential (primary) hypertension: Secondary | ICD-10-CM | POA: Diagnosis present

## 2020-12-27 DIAGNOSIS — Z79899 Other long term (current) drug therapy: Secondary | ICD-10-CM

## 2020-12-27 DIAGNOSIS — J449 Chronic obstructive pulmonary disease, unspecified: Secondary | ICD-10-CM | POA: Diagnosis present

## 2020-12-27 DIAGNOSIS — Z801 Family history of malignant neoplasm of trachea, bronchus and lung: Secondary | ICD-10-CM

## 2020-12-27 LAB — LACTIC ACID, PLASMA: Lactic Acid, Venous: 1.3 mmol/L (ref 0.5–1.9)

## 2020-12-27 LAB — URINALYSIS, ROUTINE W REFLEX MICROSCOPIC
Bacteria, UA: NONE SEEN
Bilirubin Urine: NEGATIVE
Glucose, UA: >=500 mg/dL — AB
Hgb urine dipstick: NEGATIVE
Ketones, ur: NEGATIVE mg/dL
Leukocytes,Ua: NEGATIVE
Nitrite: NEGATIVE
Protein, ur: NEGATIVE mg/dL
Specific Gravity, Urine: 1.037 — ABNORMAL HIGH (ref 1.005–1.030)
pH: 5 (ref 5.0–8.0)

## 2020-12-27 LAB — HEMOGLOBIN A1C
Hgb A1c MFr Bld: 9.1 % — ABNORMAL HIGH (ref 4.8–5.6)
Mean Plasma Glucose: 214.47 mg/dL

## 2020-12-27 LAB — CBC WITH DIFFERENTIAL/PLATELET
Abs Immature Granulocytes: 0.04 10*3/uL (ref 0.00–0.07)
Basophils Absolute: 0 10*3/uL (ref 0.0–0.1)
Basophils Relative: 0 %
Eosinophils Absolute: 0.1 10*3/uL (ref 0.0–0.5)
Eosinophils Relative: 1 %
HCT: 34.1 % — ABNORMAL LOW (ref 39.0–52.0)
Hemoglobin: 10.9 g/dL — ABNORMAL LOW (ref 13.0–17.0)
Immature Granulocytes: 1 %
Lymphocytes Relative: 14 %
Lymphs Abs: 1.2 10*3/uL (ref 0.7–4.0)
MCH: 27.8 pg (ref 26.0–34.0)
MCHC: 32 g/dL (ref 30.0–36.0)
MCV: 87 fL (ref 80.0–100.0)
Monocytes Absolute: 0.7 10*3/uL (ref 0.1–1.0)
Monocytes Relative: 8 %
Neutro Abs: 6.4 10*3/uL (ref 1.7–7.7)
Neutrophils Relative %: 76 %
Platelets: 502 10*3/uL — ABNORMAL HIGH (ref 150–400)
RBC: 3.92 MIL/uL — ABNORMAL LOW (ref 4.22–5.81)
RDW: 14.8 % (ref 11.5–15.5)
WBC: 8.4 10*3/uL (ref 4.0–10.5)
nRBC: 0 % (ref 0.0–0.2)

## 2020-12-27 LAB — COMPREHENSIVE METABOLIC PANEL
ALT: 9 U/L (ref 0–44)
AST: 10 U/L — ABNORMAL LOW (ref 15–41)
Albumin: 3 g/dL — ABNORMAL LOW (ref 3.5–5.0)
Alkaline Phosphatase: 134 U/L — ABNORMAL HIGH (ref 38–126)
Anion gap: 9 (ref 5–15)
BUN: 11 mg/dL (ref 6–20)
CO2: 23 mmol/L (ref 22–32)
Calcium: 9.1 mg/dL (ref 8.9–10.3)
Chloride: 103 mmol/L (ref 98–111)
Creatinine, Ser: 0.75 mg/dL (ref 0.61–1.24)
GFR, Estimated: >60 mL/min (ref >60)
Glucose, Bld: 162 mg/dL — ABNORMAL HIGH (ref 70–99)
Potassium: 4.5 mmol/L (ref 3.5–5.1)
Sodium: 135 mmol/L (ref 135–145)
Total Bilirubin: 0.6 mg/dL (ref 0.3–1.2)
Total Protein: 8.4 g/dL — ABNORMAL HIGH (ref 6.5–8.1)

## 2020-12-27 LAB — HIV ANTIBODY (ROUTINE TESTING W REFLEX): HIV Screen 4th Generation wRfx: NONREACTIVE

## 2020-12-27 LAB — PROTIME-INR
INR: 0.9 (ref 0.8–1.2)
Prothrombin Time: 12.4 seconds (ref 11.4–15.2)

## 2020-12-27 LAB — CREATININE, SERUM
Creatinine, Ser: 0.79 mg/dL (ref 0.61–1.24)
GFR, Estimated: >60 mL/min (ref >60)

## 2020-12-27 LAB — SARS CORONAVIRUS 2 (TAT 6-24 HRS): SARS Coronavirus 2: NEGATIVE

## 2020-12-27 LAB — GLUCOSE, CAPILLARY
Glucose-Capillary: 230 mg/dL — ABNORMAL HIGH (ref 70–99)
Glucose-Capillary: 86 mg/dL (ref 70–99)
Glucose-Capillary: 91 mg/dL (ref 70–99)

## 2020-12-27 LAB — APTT: aPTT: 31 seconds (ref 24–36)

## 2020-12-27 LAB — CBG MONITORING, ED: Glucose-Capillary: 114 mg/dL — ABNORMAL HIGH (ref 70–99)

## 2020-12-27 MED ORDER — DULOXETINE HCL 60 MG PO CPEP
60.0000 mg | ORAL_CAPSULE | Freq: Every day | ORAL | Status: DC
  Administered 2020-12-27 – 2021-01-17 (×21): 60 mg via ORAL
  Filled 2020-12-27 (×21): qty 1

## 2020-12-27 MED ORDER — OXYCODONE HCL 5 MG PO TABS
5.0000 mg | ORAL_TABLET | ORAL | Status: DC | PRN
Start: 2020-12-27 — End: 2021-01-01
  Administered 2020-12-27 – 2020-12-31 (×17): 10 mg via ORAL
  Administered 2021-01-01: 5 mg via ORAL
  Administered 2021-01-01: 10 mg via ORAL
  Filled 2020-12-27 (×20): qty 2

## 2020-12-27 MED ORDER — MIRTAZAPINE 15 MG PO TABS
30.0000 mg | ORAL_TABLET | Freq: Every day | ORAL | Status: DC
  Administered 2020-12-28 – 2021-01-16 (×19): 30 mg via ORAL
  Filled 2020-12-27 (×2): qty 2
  Filled 2020-12-27: qty 1
  Filled 2020-12-27: qty 2
  Filled 2020-12-27 (×4): qty 1
  Filled 2020-12-27 (×5): qty 2
  Filled 2020-12-27: qty 1
  Filled 2020-12-27: qty 2
  Filled 2020-12-27: qty 1
  Filled 2020-12-27 (×2): qty 2
  Filled 2020-12-27: qty 1
  Filled 2020-12-27 (×10): qty 2

## 2020-12-27 MED ORDER — LORATADINE 10 MG PO TABS
10.0000 mg | ORAL_TABLET | Freq: Every day | ORAL | Status: DC
  Administered 2020-12-27 – 2021-01-17 (×21): 10 mg via ORAL
  Filled 2020-12-27 (×21): qty 1

## 2020-12-27 MED ORDER — HEPARIN SODIUM (PORCINE) 5000 UNIT/ML IJ SOLN
5000.0000 [IU] | Freq: Three times a day (TID) | INTRAMUSCULAR | Status: DC
  Administered 2020-12-27 – 2020-12-30 (×10): 5000 [IU] via SUBCUTANEOUS
  Filled 2020-12-27 (×10): qty 1

## 2020-12-27 MED ORDER — INSULIN GLARGINE 100 UNIT/ML ~~LOC~~ SOLN
9.0000 [IU] | Freq: Every day | SUBCUTANEOUS | Status: DC
  Administered 2020-12-29: 5 [IU] via SUBCUTANEOUS
  Administered 2020-12-30 – 2021-01-01 (×2): 9 [IU] via SUBCUTANEOUS
  Filled 2020-12-27 (×10): qty 0.09

## 2020-12-27 MED ORDER — ALBUTEROL SULFATE HFA 108 (90 BASE) MCG/ACT IN AERS
2.0000 | INHALATION_SPRAY | RESPIRATORY_TRACT | Status: DC | PRN
  Administered 2021-01-11 – 2021-01-12 (×2): 2 via RESPIRATORY_TRACT
  Filled 2020-12-27 (×2): qty 6.7

## 2020-12-27 MED ORDER — MONTELUKAST SODIUM 10 MG PO TABS
10.0000 mg | ORAL_TABLET | Freq: Every day | ORAL | Status: DC
  Administered 2020-12-27 – 2021-01-16 (×20): 10 mg via ORAL
  Filled 2020-12-27 (×20): qty 1

## 2020-12-27 MED ORDER — ARIPIPRAZOLE 2 MG PO TABS
5.0000 mg | ORAL_TABLET | Freq: Every evening | ORAL | Status: DC
  Administered 2020-12-27 – 2021-01-16 (×20): 5 mg via ORAL
  Filled 2020-12-27: qty 1
  Filled 2020-12-27: qty 3
  Filled 2020-12-27 (×3): qty 1
  Filled 2020-12-27: qty 3
  Filled 2020-12-27 (×6): qty 1
  Filled 2020-12-27 (×3): qty 3
  Filled 2020-12-27 (×3): qty 1
  Filled 2020-12-27: qty 3
  Filled 2020-12-27 (×3): qty 1

## 2020-12-27 MED ORDER — SODIUM CHLORIDE 0.9 % IV SOLN
1000.0000 mg | Freq: Every day | INTRAVENOUS | Status: DC
  Administered 2020-12-27 – 2021-01-12 (×16): 1000 mg via INTRAVENOUS
  Filled 2020-12-27 (×9): qty 20
  Filled 2020-12-27: qty 10
  Filled 2020-12-27 (×10): qty 20

## 2020-12-27 MED ORDER — PANCRELIPASE (LIP-PROT-AMYL) 12000-38000 UNITS PO CPEP
36000.0000 [IU] | ORAL_CAPSULE | Freq: Three times a day (TID) | ORAL | Status: DC
  Administered 2020-12-27 – 2021-01-17 (×50): 36000 [IU] via ORAL
  Filled 2020-12-27: qty 1
  Filled 2020-12-27: qty 3
  Filled 2020-12-27 (×11): qty 1
  Filled 2020-12-27 (×2): qty 3
  Filled 2020-12-27 (×7): qty 1
  Filled 2020-12-27: qty 3
  Filled 2020-12-27 (×2): qty 1
  Filled 2020-12-27: qty 3
  Filled 2020-12-27 (×3): qty 1
  Filled 2020-12-27 (×2): qty 3
  Filled 2020-12-27: qty 1
  Filled 2020-12-27: qty 3
  Filled 2020-12-27 (×3): qty 1
  Filled 2020-12-27 (×3): qty 3
  Filled 2020-12-27: qty 1
  Filled 2020-12-27: qty 3
  Filled 2020-12-27 (×2): qty 1
  Filled 2020-12-27: qty 3
  Filled 2020-12-27 (×3): qty 1
  Filled 2020-12-27: qty 3
  Filled 2020-12-27 (×5): qty 1
  Filled 2020-12-27: qty 3
  Filled 2020-12-27 (×7): qty 1
  Filled 2020-12-27: qty 3
  Filled 2020-12-27: qty 1
  Filled 2020-12-27: qty 3
  Filled 2020-12-27: qty 1

## 2020-12-27 MED ORDER — ATORVASTATIN CALCIUM 40 MG PO TABS
40.0000 mg | ORAL_TABLET | Freq: Every day | ORAL | Status: DC
  Administered 2020-12-27 – 2021-01-17 (×20): 40 mg via ORAL
  Filled 2020-12-27 (×20): qty 1

## 2020-12-27 MED ORDER — METFORMIN HCL 500 MG PO TABS
1000.0000 mg | ORAL_TABLET | Freq: Two times a day (BID) | ORAL | Status: DC
  Administered 2020-12-27 – 2021-01-09 (×23): 1000 mg via ORAL
  Filled 2020-12-27 (×25): qty 2

## 2020-12-27 MED ORDER — INSULIN ASPART 100 UNIT/ML ~~LOC~~ SOLN
0.0000 [IU] | Freq: Three times a day (TID) | SUBCUTANEOUS | Status: DC
  Administered 2020-12-27: 3 [IU] via SUBCUTANEOUS
  Administered 2020-12-28 – 2020-12-30 (×7): 2 [IU] via SUBCUTANEOUS
  Administered 2020-12-30: 1 [IU] via SUBCUTANEOUS
  Administered 2020-12-31: 3 [IU] via SUBCUTANEOUS
  Administered 2020-12-31: 1 [IU] via SUBCUTANEOUS
  Administered 2020-12-31: 2 [IU] via SUBCUTANEOUS
  Administered 2021-01-01: 1 [IU] via SUBCUTANEOUS
  Administered 2021-01-01: 2 [IU] via SUBCUTANEOUS
  Administered 2021-01-01: 3 [IU] via SUBCUTANEOUS
  Administered 2021-01-02 (×2): 1 [IU] via SUBCUTANEOUS
  Administered 2021-01-02: 2 [IU] via SUBCUTANEOUS

## 2020-12-27 MED ORDER — OXYCODONE HCL 5 MG PO TABS
5.0000 mg | ORAL_TABLET | ORAL | Status: DC | PRN
  Administered 2020-12-27: 5 mg via ORAL
  Filled 2020-12-27: qty 1

## 2020-12-27 MED ORDER — ACETAMINOPHEN 650 MG RE SUPP: 650.0000 mg | Freq: Four times a day (QID) | RECTAL | Status: DC | PRN

## 2020-12-27 MED ORDER — OXYCODONE HCL 5 MG PO TABS
10.0000 mg | ORAL_TABLET | Freq: Once | ORAL | Status: AC
  Administered 2020-12-27: 10 mg via ORAL
  Filled 2020-12-27: qty 2

## 2020-12-27 MED ORDER — ACETAMINOPHEN 325 MG PO TABS
650.0000 mg | ORAL_TABLET | Freq: Four times a day (QID) | ORAL | Status: DC | PRN
  Administered 2020-12-27 – 2021-01-01 (×12): 650 mg via ORAL
  Filled 2020-12-27 (×13): qty 2

## 2020-12-27 MED ORDER — BISMUTH SUBSALICYLATE 262 MG/15ML PO SUSP
30.0000 mL | Freq: Four times a day (QID) | ORAL | Status: DC | PRN
  Administered 2021-01-01: 30 mL via ORAL
  Filled 2020-12-27 (×2): qty 236

## 2020-12-27 MED ORDER — GABAPENTIN 300 MG PO CAPS
600.0000 mg | ORAL_CAPSULE | Freq: Three times a day (TID) | ORAL | Status: DC
  Administered 2020-12-27 – 2021-01-17 (×62): 600 mg via ORAL
  Filled 2020-12-27 (×62): qty 2

## 2020-12-27 MED ORDER — KETOROLAC TROMETHAMINE 15 MG/ML IJ SOLN
15.0000 mg | Freq: Four times a day (QID) | INTRAMUSCULAR | Status: DC
  Administered 2020-12-27 – 2020-12-28 (×4): 15 mg via INTRAVENOUS
  Filled 2020-12-27 (×4): qty 1

## 2020-12-27 MED ORDER — PROPRANOLOL HCL ER 80 MG PO CP24
80.0000 mg | ORAL_CAPSULE | Freq: Every day | ORAL | Status: DC
  Administered 2020-12-27 – 2021-01-04 (×9): 80 mg via ORAL
  Filled 2020-12-27 (×10): qty 1

## 2020-12-27 MED ORDER — SODIUM CHLORIDE 0.9 % IV SOLN: INTRAVENOUS | Status: DC

## 2020-12-27 MED ORDER — PANTOPRAZOLE SODIUM 40 MG PO TBEC
40.0000 mg | DELAYED_RELEASE_TABLET | Freq: Two times a day (BID) | ORAL | Status: DC
  Administered 2020-12-27 – 2021-01-17 (×42): 40 mg via ORAL
  Filled 2020-12-27 (×42): qty 1

## 2020-12-27 MED ORDER — LIDOCAINE 5 % EX PTCH: 1.0000 | MEDICATED_PATCH | Freq: Every day | CUTANEOUS | Status: DC | PRN

## 2020-12-27 NOTE — ED Notes (Signed)
Attempted report to inpatient unit x 2

## 2020-12-27 NOTE — Consult Note (Signed)
Reason for Consult: Progressive osteomyelitis with epidural abscess T9-T10 despite 1 month of antibiotic treatment Referring Physician: Dr. [name] is an 50 y.o. male.  HPI: Christopher Fernandez is a 50 year old male who has had a number of infections involving mostly both lower extremities including a number of surgical debridements and amputations of his toes.  He now has developed a thoracic discitis and ultimately has been on daptomycin as an antibiotic for over a month's time now.  He has had worsening back pain that he has been experiencing despite the IV antibiotic treatment.  He is not been febrile.  A recent MRI demonstrates that there is no progression of the discitis into osteomyelitis in both vertebrae T9 and T10 with formation of an epidural abscess effacing the ventral aspect of the spinal cord but not causing any overt compression.  He is admitted now for progressive disease despite adequate antibiotic treatment.  Past Medical History:  Diagnosis Date  . Alcoholism ([place])   . Anemia   . Anxiety   . Arthritis   . Asthma   . Blood transfusion without reported diagnosis   . COPD (chronic obstructive pulmonary disease) ([place])   . Depression   . Diabetes ([place])    type 2  . GERD (gastroesophageal reflux disease)   . History of hiatal hernia   . HTN (hypertension)   . MRSA bacteremia   . MRSA infection 01/11/2020  . Neuromuscular disorder ([facility])    tremors  . Osteomyelitis ([place])    right forefoot  . Pancreatitis   . Pneumonia   . Substance abuse ([place])   . 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 Wears glasses     Past Surgical History:  Procedure Laterality Date  . 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 AMPUTATION Right 10/18/2019   Procedure: RIGHT SECOND TOE AMPUTATION;  Surgeon: Newt Minion, MD;  Location: [facility];  Service: Orthopedics;  Laterality: Right;  . AMPUTATION Right 11/08/2019   Procedure: RIGHT TRANSMETATARSAL AMPUTATION;  Surgeon: Newt Minion, MD;  Location: [facility];  Service: Orthopedics;  Laterality: Right;  . AMPUTATION Left 12/02/2020   Procedure: AMPUTATION 2ND TOE;  Surgeon: Elam Dutch, MD;  Location: [facility];  Service: Vascular;  Laterality: Left;  . APPLICATION OF WOUND VAC Right 01/10/2020   Procedure: APPLICATION OF WOUND VAC, right foot;  Surgeon: [name], MD;  Location: [facility];  Service: Vascular;  Laterality: Right;  . BILIARY DILATION  12/06/2020   Procedure: BILIARY DILATION;  Surgeon: Rush Landmark Telford Nab., MD;  Location: [facility];  Service: Gastroenterology;;  . BIOPSY  12/06/2020   Procedure: BIOPSY;  Surgeon: Irving Copas., MD;  Location: [facility] ENDOSCOPY;  Service: Gastroenterology;;  . Lorin Mercy  2006   with gallbladder and spleen  . COLONOSCOPY  8-10 years ago    in [place] exam per pt  . ENDOSCOPIC RETROGRADE CHOLANGIOPANCREATOGRAPHY (ERCP) WITH PROPOFOL N/A 12/06/2020   Procedure: ENDOSCOPIC RETROGRADE CHOLANGIOPANCREATOGRAPHY (ERCP) WITH PROPOFOL;  Surgeon: Rush Landmark Telford Nab., MD;  Location: [facility];  Service: Gastroenterology;  Laterality: N/A;  . HERNIA REPAIR  2008, 2010   hiatal hernia and 1 additional  . NASAL SEPTUM SURGERY    . PANCREATIC PSEUDOCYST DRAINAGE    . PANCREATIC STENT PLACEMENT  12/06/2020   Procedure: PANCREATIC STENT PLACEMENT;  Surgeon: Rush Landmark Telford Nab., MD;  Location: [facility];  Service: Gastroenterology;;  . REMOVAL OF STONES  12/06/2020   Procedure: REMOVAL  OF STONES;  Surgeon: Rush Landmark Telford Nab., MD;  Location: [facility];  Service: Gastroenterology;;  . [name]  12/06/2020   Procedure: [name];  Surgeon: Irving Copas., MD;  Location: [facility] ENDOSCOPY;  Service: Gastroenterology;;  . SPLENECTOMY  2006  . STUMP REVISION Right 11/24/2019   Procedure: REVISION RIGHT TRANSMETATARSAL AMPUTATION;  Surgeon: Newt Minion, MD;  Location: [facility];  Service: Orthopedics;  Laterality: Right;  . TEE  WITHOUT CARDIOVERSION N/A 12/04/2020   Procedure: TRANSESOPHAGEAL ECHOCARDIOGRAM (TEE);  Surgeon: Acie Fredrickson Wonda Cheng, MD;  Location: [place];  Service: Cardiovascular;  Laterality: N/A;  . TOTAL KNEE ARTHROPLASTY Right 1982, 1984  . TRANSMETATARSAL AMPUTATION Left 09/06/2020   Procedure: AMPUTATION LEFT GREAT TOE;  Surgeon: Waynetta Sandy, MD;  Location: [place];  Service: Vascular;  Laterality: Left;  . WOUND DEBRIDEMENT Right 01/10/2020   Procedure: incisional DEBRIDEMENT of RIGHT TRANSMETATARSAL WOUND;  Surgeon: [name], MD;  Location: [facility] OR;  Service: Vascular;  Laterality: Right;    Family History  Problem Relation Age of Onset  . Diabetes Mother   . Hypertension Mother   . Hyperlipidemia Mother   . Kidney disease Mother   . Thyroid disease Mother   . Breast cancer Mother        mets  . Lung cancer Mother   . Diabetes Father   . Alcohol abuse Sister   . Sickle cell trait Sister   . Diabetes Brother   . Asthma Brother   . Lung cancer Maternal Grandmother   . Kidney disease Maternal Grandmother   . Liver cancer Maternal Uncle        x 4-5  . Kidney disease Maternal Uncle   . Kidney disease Paternal Uncle   . Colon cancer Neg Hx   . Colon polyps Neg Hx   . Esophageal cancer Neg Hx   . Rectal cancer Neg Hx   . Stomach cancer Neg Hx     Social History:  reports that he has been smoking cigarettes. He started smoking about 38 years ago. He has a 11.40 pack-year smoking history. He has never used smokeless tobacco. He reports previous alcohol use. He reports previous drug use.  Allergies:  Allergies  Allergen Reactions  . Eggs Or Egg-Derived Products Rash  . Morphine And Related Other (See Comments)    Cant take because of pancreatitis  . Cocoa Rash    Medications: I have reviewed the patient's current medications.  Results for orders placed or performed during the hospital encounter of 12/27/20 (from the past 48 hour(s))  SARS CORONAVIRUS 2  (TAT 6-24 HRS) Nasopharyngeal Nasopharyngeal Swab     Status: None   Collection Time: 12/27/20  6:16 AM   Specimen: Nasopharyngeal Swab  Result Value Ref Range   SARS Coronavirus 2 NEGATIVE NEGATIVE    Comment: (NOTE) SARS-CoV-2 target nucleic acids are NOT DETECTED.  The SARS-CoV-2 RNA is generally detectable in upper and lower respiratory specimens during the acute phase of infection. Negative results do not preclude SARS-CoV-2 infection, do not rule out co-infections with other pathogens, and should not be used as the sole basis for treatment or other patient management decisions. Negative results must be combined with clinical observations, patient history, and epidemiological information. The expected result is Negative.  Fact Sheet for Patients: SugarRoll.be  Fact Sheet for Healthcare Providers: https://www.woods-mathews.com/  This test is not yet approved or cleared by the [place] FDA and  has been authorized for detection and/or diagnosis of SARS-CoV-2 by FDA  under an Emergency Use Authorization (EUA). This EUA will remain  in effect (meaning this test can be used) for the duration of the COVID-19 declaration under Se ction 564(b)(1) of the Act, 21 U.S.C. section 360bbb-3(b)(1), unless the authorization is terminated or revoked sooner.  Performed at [facility], [place] [address] [place], [place] [address]   Lactic acid, plasma     Status: None   Collection Time: 12/27/20  6:29 AM  Result Value Ref Range   Lactic Acid, Venous 1.3 0.5 - 1.9 mmol/L    Comment: Performed at [facility] [address], [place], [place] [address]  Comprehensive metabolic panel     Status: Abnormal   Collection Time: 12/27/20  6:44 AM  Result Value Ref Range   Sodium 135 135 - 145 mmol/L   Potassium 4.5 3.5 - 5.1 mmol/L   Chloride 103 98 - 111 mmol/L   CO2 23 22 - 32 mmol/L   Glucose, Bld 162 (H) 70 - 99 mg/dL    Comment:  Glucose reference range applies only to samples taken after fasting for at least 8 hours.   BUN 11 6 - 20 mg/dL   Creatinine, Ser 0.75 0.61 - 1.24 mg/dL   Calcium 9.1 8.9 - 10.3 mg/dL   Total Protein 8.4 (H) 6.5 - 8.1 g/dL   Albumin 3.0 (L) 3.5 - 5.0 g/dL   AST 10 (L) 15 - 41 U/L   ALT 9 0 - 44 U/L   Alkaline Phosphatase 134 (H) 38 - 126 U/L   Total Bilirubin 0.6 0.3 - 1.2 mg/dL   GFR, Estimated >60 >60 mL/min    Comment: (NOTE) Calculated using the CKD-EPI Creatinine Equation (2021)    Anion gap 9 5 - 15    Comment: Performed at [facility], [place] [address], [place], [place] [address]  Protime-INR     Status: None   Collection Time: 12/27/20  6:44 AM  Result Value Ref Range   Prothrombin Time 12.4 11.4 - 15.2 seconds   INR 0.9 0.8 - 1.2    Comment: (NOTE) INR goal varies based on device and disease states. Performed at [facility], [place] [address], [place], [place] [address]   APTT     Status: None   Collection Time: 12/27/20  6:44 AM  Result Value Ref Range   aPTT 31 24 - 36 seconds    Comment: Performed at [facility] [address], [place],  [address]  CBC with Differential/Platelet     Status: Abnormal   Collection Time: 12/27/20  8:21 AM  Result Value Ref Range   WBC 8.4 4.0 - 10.5 K/uL   RBC 3.92 (L) 4.22 - 5.81 MIL/uL   Hemoglobin 10.9 (L) 13.0 - 17.0 g/dL   HCT 34.1 (L) 39.0 - 52.0 %   MCV 87.0 80.0 - 100.0 fL   MCH 27.8 26.0 - 34.0 pg   MCHC 32.0 30.0 - 36.0 g/dL   RDW 14.8 11.5 - 15.5 %   Platelets 502 (H) 150 - 400 K/uL   nRBC 0.0 0.0 - 0.2 %   Neutrophils Relative % 76 %   Neutro Abs 6.4 1.7 - 7.7 K/uL   Lymphocytes Relative 14 %   Lymphs Abs 1.2 0.7 - 4.0 K/uL   Monocytes Relative 8 %   Monocytes Absolute 0.7 0.1 - 1.0 K/uL   Eosinophils Relative 1 %   Eosinophils Absolute 0.1 0.0 - 0.5 K/uL  Basophils Relative 0 %   Basophils Absolute 0.0 0.0 - 0.1 K/uL   Immature Granulocytes 1 %   Abs Immature Granulocytes 0.04 0.00  - 0.07 K/uL    Comment: Performed at [facility], [place] [address], [place], [place] [address]  Urinalysis, Routine w reflex microscopic     Status: Abnormal   Collection Time: 12/27/20  9:20 AM  Result Value Ref Range   Color, Urine YELLOW YELLOW   APPearance CLEAR CLEAR   Specific Gravity, Urine 1.037 (H) 1.005 - 1.030   pH 5.0 5.0 - 8.0   Glucose, UA >=500 (A) NEGATIVE mg/dL   Hgb urine dipstick NEGATIVE NEGATIVE   Bilirubin Urine NEGATIVE NEGATIVE   Ketones, ur NEGATIVE NEGATIVE mg/dL   Protein, ur NEGATIVE NEGATIVE mg/dL   Nitrite NEGATIVE NEGATIVE   Leukocytes,Ua NEGATIVE NEGATIVE   RBC / HPF 0-5 0 - 5 RBC/hpf   WBC, UA 0-5 0 - 5 WBC/hpf   Bacteria, UA NONE SEEN NONE SEEN   Squamous Epithelial / LPF 0-5 0 - 5    Comment: Performed at [facility], [place] [address], [place], [place] [address]  HIV Antibody (routine testing w rflx)     Status: None   Collection Time: 12/27/20 11:11 AM  Result Value Ref Range   HIV Screen 4th Generation wRfx Non Reactive Non Reactive    Comment: Performed at [facility], [place] [address] [place], [place] [address]  Creatinine, serum     Status: None   Collection Time: 12/27/20 11:11 AM  Result Value Ref Range   Creatinine, Ser 0.79 0.61 - 1.24 mg/dL   GFR, Estimated >60 >60 mL/min    Comment: (NOTE) Calculated using the CKD-EPI Creatinine Equation (2021) Performed at [facility] [address], [place], [place] [address]   Hemoglobin A1c     Status: Abnormal   Collection Time: 12/27/20 11:11 AM  Result Value Ref Range   Hgb A1c MFr Bld 9.1 (H) 4.8 - 5.6 %    Comment: (NOTE) Pre diabetes:          5.7%-6.4%  Diabetes:              >6.4%  Glycemic control for   <7.0% adults with diabetes    Mean Plasma Glucose 214.47 mg/dL    Comment: Performed at [facility] [address] [place], [place] [address]  CBG monitoring, ED     Status: Abnormal   Collection Time: 12/27/20 12:07 PM  Result Value Ref  Range   Glucose-Capillary 114 (H) 70 - 99 mg/dL    Comment: Glucose reference range applies only to samples taken after fasting for at least 8 hours.    MR THORACIC SPINE W WO CONTRAST  Result Date: 12/26/2020 CLINICAL DATA:  Follow-up examination for epidural abscess. EXAM: MRI THORACIC WITHOUT AND WITH CONTRAST TECHNIQUE: Multiplanar and multiecho pulse sequences of the thoracic spine were obtained without and with intravenous contrast. CONTRAST:  55mL GADAVIST GADOBUTROL 1 MMOL/ML IV SOLN COMPARISON:  Prior MRI from 11/30/2020. FINDINGS: Alignment: Stable alignment with preservation of the normal thoracic kyphosis. Underlying mild dextroscoliosis noted. No interval listhesis. Vertebrae: Findings consistent with osteomyelitis discitis again seen involving the T9-10 interspace. Since previous exam, there has been progressive endplate irregularity with mild height loss about the T9 and T10 vertebral bodies. Reactive edema and enhancement involving the T9 interspace and adjacent vertebral bodies also appears worsened. Epidural phlegmon/abscess involving the adjacent thecal sac appears  slightly worsened and progressed from previous. Extension to involve the posterior elements with involvement of the right worse than left T9-10 facets, consistent with septic arthritis. This is also worsened in appearance. At the level of T9-10, this is circumferential in nature involving both the ventral and dorsal epidural space (series 19, image 33). Associated mild to moderate spinal stenosis has progressed and worsened. No frank cord impingement at this time. On sagittal postcontrast sequences, abnormal epidural enhancement involves the ventral epidural space extending from the T8 through T11 12 levels (series 23, image 10). Posteriorly at the dorsal epidural space, abnormal enhancement is seen extending from approximately T4-5 through the visualized upper thoracic spine (series 23, images 8-11). Additionally, new subtle  postcontrast enhancement is seen involving the posterior/inferior aspect of the T8 vertebral body (series 23, image 10) as well as the posterosuperior aspect of the T11 vertebral body (series 23, image 10), concerning for early changes of osteomyelitis at these levels as well. This is new from previous. The intervening T8-9 and T10-11 disc spaces remain relatively normal at this time. No other new or distant sites of infection seen elsewhere within the thoracic spine. Underlying bone marrow signal intensity within normal limits. No discrete or worrisome osseous lesions. Cord: Signal intensity within the thoracic spinal cord is within normal limits. Circumferential epidural abscess at the level of T9-10 with associated moderate spinal stenosis as above, progressed from previous. Paraspinal and other soft tissues: Inflammatory change with enhancement seen involving the paraspinous soft tissues adjacent to the T9-10 interspace (series 24, image 38). Overall appearance is most consistent with phlegmon, with no definite discrete soft tissue collection or abscess identified. Overall, appearance is mildly worsened from previous. Paraspinous soft tissues demonstrate no other acute finding. Partially visualized lungs are grossly clear. Visualized visceral structures otherwise unremarkable. Disc levels: No other new disc pathology seen within the thoracic spine. No other stenosis or impingement. IMPRESSION: 1. Persistent findings of osteomyelitis discitis involving the T9-10 level with involvement of the posterior elements, progressed and worsened in appearance as compared to 11/30/2020. 2. Associated circumferential epidural phlegmon/abscess at the level of T9-10, also progressed and worsened now with up to moderate spinal stenosis at this level. 3. Progressive paraspinous phlegmonous change about the T9-10 interspace. No frank soft tissue abscess or drainable fluid collection. 4. Mild enhancement within the adjacent T8 and  T11 vertebral bodies as above, also concerning for osteomyelitis. This is new and progressed from previous. These results will be called to the ordering clinician or representative by the Radiologist Assistant, and communication documented in the PACS or [employer]. Electronically Signed   By: Jeannine Boga M.D.   On: 12/26/2020 04:35   DG Chest Port 1 View  Result Date: 12/27/2020 CLINICAL DATA:  Sepsis EXAM: PORTABLE CHEST 1 VIEW COMPARISON:  10/29/2020 FINDINGS: Right upper extremity PICC line tip is seen within the superior vena cava. Lungs are clear. No pneumothorax or pleural effusion. Cardiac size within normal limits. Pulmonary vascularity is normal. IMPRESSION: No active disease. Electronically Signed   By: Fidela Salisbury MD   On: 12/27/2020 06:40    Review of Systems  Constitutional: Positive for activity change.  HENT: Negative.   Respiratory: Negative.   Gastrointestinal: Negative.   Endocrine:       Diabetes  Musculoskeletal: Positive for back pain.  Skin: Negative.   Allergic/Immunologic: Negative.   Hematological: Negative.   Psychiatric/Behavioral: Negative.    Blood pressure 123/86, pulse 83, temperature 97.7 F (36.5 C), temperature source Oral, resp.  rate 16, height 6\' 3"  (1.905 m), weight 106.6 kg, SpO2 100 %. Physical Exam Constitutional:      Appearance: Normal appearance.  HENT:     Head: Normocephalic and atraumatic.     Nose: Nose normal.     Mouth/Throat:     Mouth: Mucous membranes are moist.  Eyes:     Extraocular Movements: Extraocular movements intact.  Cardiovascular:     Rate and Rhythm: Normal rate and regular rhythm.  Pulmonary:     Effort: Pulmonary effort is normal.     Breath sounds: Normal breath sounds.  Musculoskeletal:     Cervical back: Normal range of motion and neck supple.     Comments: Tenderness in the mid thoracic spine with radiation around the lower rib cage and upper abdomen  Skin:    Capillary Refill: Capillary  refill takes less than 2 seconds.  Neurological:     Mental Status: He is alert.     Comments: Motor strength appears strong in the iliopsoas and the quadriceps tibialis anterior and gastrocs in both lower extremities at 4+ out of 5.  Straight leg raising reproduces mid back pain at 45 degrees in either lower extremity.  Patrick's maneuver is negative.  Upper extremity strength and reflexes are intact cranial nerve examination is normal.     Assessment/Plan: Osteomyelitis discitis and epidural abscess T9-T10.  Plan: He will likely need surgical decompression via thoracotomy to do a thorough resection of the infected vertebrae with placement of an interbody graft and lateral plate reconstruction.  This will require some planning and I believe that we can schedule this to be done in the beginning of the coming week.  In the meantime to complete the planning will obtain a CT scan of the thoracic spine.  I have discussed the situation with the patient and will proceed with planning for surgical intervention  [name] 12/27/2020, 3:14 PM

## 2020-12-27 NOTE — Progress Notes (Signed)
Pharmacy Antibiotic Note  Christopher Fernandez is a 50 y.o. male admitted on 12/27/2020 with back pain and worsening epidural abscess on MRI 4/13. Noted patient has been on Daptomycin since 3/22 for MRSA bacteremia/epidural abscess.  Pharmacy has been consulted to continue Daptomycin. Last CK 4/4 was still within normal limits at 224. Scr is stable at <1 with CrCl  > 100 ml/min.   Plan: Continue Daptomycin 1000 mg every 24 hours CK tomorrow morning and weekly F/U surgical plans   Height: 6\' 3"  (190.5 cm) Weight: 106.6 kg (235 lb 0.2 oz) IBW/kg (Calculated) : 84.5  Temp (24hrs), Avg:98 F (36.7 C), Min:97.4 F (36.3 C), Max:98.3 F (36.8 C)  Recent Labs  Lab 12/27/20 0629 12/27/20 0644 12/27/20 0821  WBC  --   --  8.4  CREATININE  --  0.75  --   LATICACIDVEN 1.3  --   --     Estimated Creatinine Clearance: 142.5 mL/min (by C-G formula based on SCr of 0.75 mg/dL).    Allergies  Allergen Reactions  . Eggs Or Egg-Derived Products Rash  . Morphine And Related Other (See Comments)    Cant take because of pancreatitis  . Cocoa Rash      Thank you for allowing pharmacy to be a part of this patient's care.  Jimmy Footman, PharmD, BCPS, [facility] Clinical Pharmacist Phone: [phone] 12/27/2020 12:18 PM

## 2020-12-27 NOTE — ED Provider Notes (Signed)
MSE was initiated and I personally evaluated the patient and placed orders (if any) at  6:15 AM on December 27, 2020.  The patient appears stable so that the remainder of the MSE may be completed by another provider.  HPI:  Christopher Fernandez is a 50 y.o. male presents to the emergency department with complaints of worsening back pain.  Pt with hx of osteomyelitis and epidural abscess that is being treated with daptomycin via PICC line.  Pt reports MRI on Wednesday showed worsening and Janene Madeira called him to have him come for admission. Pt reports last dose of percocet was last night.   ROS:  Positive: back pain, "sweats" Negative: measured fever, nausea , vomiting  PE:  BP (!) 134/96 (BP Location: Left Arm)   Pulse (!) 103   Temp (!) 97.4 F (36.3 C) (Oral)   Resp 17   Ht 6\' 3"  (1.905 m)   Wt 106.6 kg   SpO2 99%   BMI 29.37 kg/m   General: Awake, no distress  HEENT: Atraumatic  Resp: Normal effort  Cardiac: normal rate Abd: Nondistended, nontender  MSK: moves all extremities without difficulty Neuro: speech clear   MDM: MSE completed and orders placed as needed.    Discussed with the patient that exiting the department prior to completion of the work-up is AMA and there is no guarantee that there are no emergency medical conditions present. Pt states understanding.      Ronna Herskowitz, Gwenlyn Perking 12/27/20 [id number]    Ripley Fraise, MD 12/27/20 [phone]

## 2020-12-27 NOTE — Hospital Course (Addendum)
Christopher Fernandez is a 50 y.o. male presenting with worsening osteomyelitis and concern of new abscess/phlegmon in thoracic spine after having a repeat MRI a few days ago. PMH is significant for history of thoracic osteomyelitis, type 2 diabetes, peripheral neuropathy, chronic pancreatitis/pancreatic insufficiency, hyperlipidemia, hypertension, asthma, seasonal allergies, GERD.   Osteomyelitis/discitis with new abscess/phlegmon in thoracic spine Patient presented to the ED with progressively worse back pain. MRI notable for worsening progression of osteomyelitis to continuous vertebra T8 and T11 with circumferential epidural abscess/phlegmon at T9-T10 with moderate stenosis at that level. Labs notable for lactic acid 1.3 without leukocytosis. Blood cultures notable for no growth for 5 days. Neurosurgery consulted who recommended surgical decompression of T9-T10, procedure completed in junction with cardiovascular surgery on 4/22 without immediate complications. ID also consulted and recommended IV daptomycin and rifampin. Unfortunately, susceptibilities returned and showed resistance to daptomycin and patient was switched to vancomycin on 5/2. He is recommended to continue antibiotics for 6-8 weeks with the start date restarting on 5/2.  Patient developed new low back pain on 5/4 and new tingling in bilateral heels.  Neurosurgery evaluated the patient and ordered MRI of the lumbar spine which showed mild degenerative changes of lower lumbar spine without significant spinal canal or neural foraminal stenosis.  Pain managed throughout hospitalization and was discharged on the following outpatient regimen: Tylenol, Ibuprofen and oxycodone 10 mg q6h.   Pleural effusion Patient was noted to have a right-sided loculated pleural effusion on CT. He developed slight oxygen requirement with [place]. Underwent thoracotomy with drainage of pleural effusion on 4/29 with placement of chest tubes. Was started on Doxycycline as well  given complicated effusion, 9/29-2/4. Anterior chest tube removed on 5/2, remaining tube removed 5/3. Breathing improved and he was stable on RA prior to discharge.   Type 2 Diabetes Mellitus  Patient with difficult to control diabetes. He persistently was hyperglycemic throughout admission.  He was maintained on Lantus, up to 12 units, and sliding scale throughout admission.  He will need outpatient follow-up for diabetes management.  All other issues chronic and stable.    Issues for follow up:  1. Recommend patient gets colonoscopy for cancer screening with unintentional weight loss 2. 6-8 week total duration of Vancomycin and Rifampin, starting form 01/13/21 3. ESR and CRP weekly  4. Follow up appointment with ID on 5/12 at 9:45AM  6. Continue tapering oxycodone; discharged on 10 mg q6h to take for 7 days. Consider decreasing to 3.5 tablets x7 days and then 3 tablets x7 days, etc.  7. Continue to counsel on limiting NSAIDs use to avoid AKI while he is on Vancomycin, shouldn't take 400 mg more than 4x/daily.  8.  Previously followed with Dr. Valentina Lucks for diabetes management. Hyperglycemic throughout admission. Will need f/u outpatient for management. D/c on Lantus 12U.

## 2020-12-27 NOTE — H&P (Addendum)
[facility] Admission History and Physical Service Pager: [phone]  Patient name: Christopher Fernandez Medical record number: [id number] Date of birth: [birth year] Age: 50 y.o. Gender: male  Primary Care Provider: Danna Hefty, DO Consultants: Neurosurgery, infectious disease Code Status: Full code  Preferred Emergency Contact: wife - [name] [phone]  Chief Complaint: Back pain/worsening osteomyelitis  Assessment and Plan: Christopher Fernandez is a 50 y.o. male presenting with worsening osteomyelitis and concern of new abscess/phlegmon in thoracic spine after having a repeat MRI a few days ago. PMH is significant for history of thoracic osteomyelitis, type 2 diabetes, peripheral neuropathy, chronic pancreatitis/pancreatic insufficiency, hyperlipidemia, hypertension, asthma, seasonal allergies, GERD.  Osteomyelitis/discitis with new abscess/phlegmon in thoracic spine: Patient with previous MRI evidence of of discitis and osteomyelitis at T9-T10 without epidural abscess at that time.  Patient was seen by infectious disease yesterday and complained of worsening pain.  Patient had MRI on 12/25/2020 that showed worsening progression of osteomyelitis to continuous vertebra at T8 and T11 with circumferential epidural abscess/phlegmon at T9-T10 with moderate spinal stenosis at that level.  He did not have neurologic deterioration on that exam and no findings concerning for sepsis at the time so was recommended to come to the emergency department.  Patient does have a PICC line and has been on daptomycin for his history of osteomyelitis with a plan to transition to twice daily doxycycline for protracted period of time once the 6 weeks of daptomycin is complete.  Patient is also on Percocet for pain in the outpatient setting.  In the emergency department patient's vitals were stable.  Physical exam with no obvious focal deficits, the patient does complain of some right leg weakness  that did improve after initiating antibiotics but started to worsen about 3 days ago.  Patient with no midline discomfort to palpation.  No complaints of loss of bowel/bladder function, no complaints of saddle paresthesias.  Patient with no initial labs showed white blood cell count within normal limits at 8.4.  Lactic acid 1.3.  No indication of sepsis at this time.  Patient does complain of mid back pain which is present all the time, and worse when leaning forward.   -Admit to med-surg, attending Dr. Gwendlyn Deutscher -Neurosurgery consultation, appreciate recommendation -Infectious disease consultation, appreciate recommendations -We will keep n.p.o. in case any procedures are needed today -Heparin subcu for DVT prophylaxis in case of procedures -Continue current antibiotics pending infectious disease recommendations, daptomycin per pharmacy ordered -PT/OT evaluate and treat -Follow-up blood and urine cultures -Acetaminophen and Toradol scheduled for pain-Toradol will likely be superior in this case for his low back pain. -Oxycodone 5 mg as needed for breakthrough pain, can increase if needed -Lidocaine patch  Osteomyelitis right hip Patient with MRI evidence from 12/11/2010 worsening osteomyelitis of the parasymphyseal right superior pubic ramus with improvement in right abductor minimus myositis.  Patient with 4/5 right hip flexor weakness compared to the left on physical exam though this may be partly due to inflammation or discomfort. -ID consult as above -Pain medication as above -Can consider orthopedic consult pending ID recommendations  Moderate thoracic spinal stenosis: Noted on most recent MRI the patient was having worsening spinal stenosis at T9-T10 in conjunction with his epidural abscess/phlegmon.  This was classified as moderate on the MRI scan.  Patient with no loss of bowel/bladder function, no saddle paresthesias, patient with some weakness, 4/5 in the right hip flexor, 5/5 in left.   Patient with 5/5 strength in plantar and dorsiflexion bilaterally.  Weakness in the right hip may be secondary to pain. -Neurosurgery consult as above, appreciate recommendations  T2DM with neuropathy Home medications include Januvia 100 mg daily, metformin 1000 mg twice daily, sliding scale insulin, 18 units insulin glargine in the mornings, Jardiance 25 mg daily.  Patient also takes gabapentin for peripheral neuropathy.  Most recent A1c of 8.7 on 12/23/2020. -We will reduce insulin by 50% while NPO pending any possible procedures today -Continue home metformin -Hold Jardiance and Januvia -Sliding scale insulin -Continue home gabapentin.  Chronic pancreatitis/pancreatic insufficiency Home medications include pancrelipase 3 times per day with meals. -We will continue with pancreatic enzymes from our formulary  History of anemia Hemoglobin on admission of 10.9.  Baseline appears to be around 10.0-12.0 -A.m. CBC  Hyperlipidemia Home medications include Lipitor 40.  Last cholesterol panel shows LDL of 34, HDL 34. -Continue home Lipitor  Asthma and history of seasonal allergies Home medications include albuterol as needed, Advair daily, Singulair, Zyrtec. -Continue home medications -Dulera per formulary   History of alcohol abuse/tobacco use disorder Patient states his last drink was about 1.5 years ago.  Continues to smoke about half pack per day, previously smoked about a pack per day for 35 years. -Can give nicotine patch if patient request.  Anxiety/depression Home medications include duloxetine 60 mg daily, Abilify 5 mg daily. -Continue home medications  GERD: Home medications include pantoprazole 40 mg twice daily -Continue home medications  History of tremors: Home medications include propranolol -Continue home propranolol  Left foot recent toe amputation. Occurred on 3/23.  Patient without complaints of pain in that region today.  Previously had right transmetatarsal  amputation about a year ago. -Continue to monitor  Unintentional weight loss: Patient notes that he is lost around 30-35 pounds over the past few months.  He states that he has had some work-up for this.  Chart review shows patient has not had a colonoscopy.  Unintentional weight loss over the past few months may be related to his osteomyelitis, however strongly recommend that he get up-to-date with his cancer screening in the outpatient setting. -We will ensure that when he follows up at our clinic that we get him up-to-date on his cancer screening -We will continue to monitor his weight loss -Can consider nutritional consult  FEN/GI: N.p.o. pending neurosurgery recommendations Prophylaxis: Subcu Heparin pending any potential procedures  Disposition: MedSurg  History of Present Illness:  Christopher Fernandez is a 50 y.o. male presenting with worsening osteomyelitis and a new abscess/phlegmon noted T9-T10 seen on MRI on recent MRI.  Patient states that his low back pain continues at about the same level since being started on IV daptomycin for osteomyelitis of that area about 3 weeks ago.  He was supposed to continue the IV daptomycin for about 6 weeks and then switch to oral doxycycline for prolonged period after this.  Patient also has a history of right hip osteomyelitis.  Patient states that his right hip pain initially was improving after starting IV antibiotics but about 3 days ago noted that it was worsening.  He notes some weakness in the right hip which may be due to pain.  Patient states he does not drink anymore since about 1.5 years or more, smokes 1ppd for 35 years, recently improved to 1/2 ppd. No illicit drugs.  Review Of Systems: Per HPI with the following additions:   Review of Systems  Constitutional: Positive for chills. Negative for fever.  HENT: Negative for sore throat.   Respiratory: Positive for  cough. Negative for shortness of breath.   Cardiovascular: Negative for chest  pain, palpitations and leg swelling.  Gastrointestinal: Positive for constipation, diarrhea and nausea. Negative for abdominal pain.  Genitourinary: Negative for dysuria.  Neurological: Negative for dizziness and headaches.     Patient Active Problem List   Diagnosis Date Noted  . Biliary sludge   . Choledocholithiasis   . Infective myositis of right lower extremity   . Acute osteomyelitis of right pelvic region and thigh ([place])   . Discitis of thoracolumbar region   . Pericardial effusion   . MRSA bacteremia   . Right hip pain   . Osteomyelitis ([place]) 11/29/2020  . Chronic pancreatitis ([place]) 10/27/2020  . Abnormal ankle brachial index (ABI)   . Diabetic foot infection ([place])   . Pyogenic inflammation of bone ([place])   . 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 Hyperlipidemia associated with type 2 diabetes mellitus ([place]) 03/15/2020  . 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[place]) 01/10/2020  . S/P transmetatarsal amputation of foot, right ([place]) 01/09/2020  . Asplenia 12/13/2019  . Exocrine pancreatic insufficiency 06/14/2018  . Uncontrolled diabetes mellitus with hyperglycemia, with long-term current use of insulin ([place]) 02/24/2018  . Alcohol use disorder, severe, dependence ([place]) 02/24/2018  . Anxiety associated with depression 02/24/2018  . GERD (gastroesophageal reflux disease) 02/24/2018  . Anemia 02/24/2018  . Asthma, intermittent 02/24/2018  . Tobacco use disorder 02/24/2018  . Benign prostatic hyperplasia 12/30/2015  . Chronic obstructive pulmonary disease ([place]) 12/30/2015  . Diabetic neuropathy ([place]) 12/30/2015  . Restless legs syndrome 12/30/2015  . Long term current use of insulin ([place]) 08/05/2015  . ED (erectile dysfunction) of organic origin 05/23/2015  . Benign essential hypertension 02/16/2014  . Extrinsic asthma without status asthmaticus 02/16/2014    Past Medical History: Past Medical History:  Diagnosis Date  . Alcoholism ([place])   . Anemia   . Anxiety   . Arthritis   . Asthma   .  Blood transfusion without reported diagnosis   . COPD (chronic obstructive pulmonary disease) ([place])   . Depression   . Diabetes ([place])    type 2  . GERD (gastroesophageal reflux disease)   . History of hiatal hernia   . HTN (hypertension)   . MRSA bacteremia   . MRSA infection 01/11/2020  . Neuromuscular disorder ([facility])    tremors  . Osteomyelitis ([place])    right forefoot  . Pancreatitis   . Pneumonia   . Substance abuse ([place])   . 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 Wears glasses     Past Surgical History: Past Surgical History:  Procedure Laterality Date  . 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 AMPUTATION Right 10/18/2019   Procedure: RIGHT SECOND TOE AMPUTATION;  Surgeon: Newt Minion, MD;  Location: [facility];  Service: Orthopedics;  Laterality: Right;  . AMPUTATION Right 11/08/2019   Procedure: RIGHT TRANSMETATARSAL AMPUTATION;  Surgeon: Newt Minion, MD;  Location: [place] [facility];  Service: Orthopedics;  Laterality: Right;  . AMPUTATION Left 12/02/2020   Procedure: AMPUTATION 2ND TOE;  Surgeon: Elam Dutch, MD;  Location: [facility];  Service: Vascular;  Laterality: Left;  . APPLICATION OF WOUND VAC Right 01/10/2020   Procedure: APPLICATION OF WOUND VAC, right foot;  Surgeon: [name], MD;  Location: [facility];  Service: Vascular;  Laterality: Right;  . BILIARY DILATION  12/06/2020   Procedure: BILIARY DILATION;  Surgeon: Rush Landmark Telford Nab., MD;  Location: [facility];  Service: Gastroenterology;;  .  BIOPSY  12/06/2020   Procedure: BIOPSY;  Surgeon: Rush Landmark Telford Nab., MD;  Location: [place] [place];  Service: Gastroenterology;;  . Wallene Dales   with gallbladder and spleen  . COLONOSCOPY  8-10 years ago    in [place] exam per pt  . ENDOSCOPIC RETROGRADE CHOLANGIOPANCREATOGRAPHY (ERCP) WITH PROPOFOL N/A 12/06/2020   Procedure: ENDOSCOPIC RETROGRADE CHOLANGIOPANCREATOGRAPHY (ERCP) WITH PROPOFOL;  Surgeon: Rush Landmark Telford Nab., MD;   Location: [facility];  Service: Gastroenterology;  Laterality: N/A;  . HERNIA REPAIR  2008, 2010   hiatal hernia and 1 additional  . NASAL SEPTUM SURGERY    . PANCREATIC PSEUDOCYST DRAINAGE    . PANCREATIC STENT PLACEMENT  12/06/2020   Procedure: PANCREATIC STENT PLACEMENT;  Surgeon: Rush Landmark Telford Nab., MD;  Location: [facility];  Service: Gastroenterology;;  . REMOVAL OF STONES  12/06/2020   Procedure: REMOVAL OF STONES;  Surgeon: Irving Copas., MD;  Location: [facility];  Service: Gastroenterology;;  . [name]  12/06/2020   Procedure: [name];  Surgeon: Irving Copas., MD;  Location: [facility] ENDOSCOPY;  Service: Gastroenterology;;  . SPLENECTOMY  2006  . STUMP REVISION Right 11/24/2019   Procedure: REVISION RIGHT TRANSMETATARSAL AMPUTATION;  Surgeon: Newt Minion, MD;  Location: [place];  Service: Orthopedics;  Laterality: Right;  . TEE WITHOUT CARDIOVERSION N/A 12/04/2020   Procedure: TRANSESOPHAGEAL ECHOCARDIOGRAM (TEE);  Surgeon: Acie Fredrickson Wonda Cheng, MD;  Location: [place];  Service: Cardiovascular;  Laterality: N/A;  . TOTAL KNEE ARTHROPLASTY Right 1982, 1984  . TRANSMETATARSAL AMPUTATION Left 09/06/2020   Procedure: AMPUTATION LEFT GREAT TOE;  Surgeon: Waynetta Sandy, MD;  Location: [facility];  Service: Vascular;  Laterality: Left;  . WOUND DEBRIDEMENT Right 01/10/2020   Procedure: incisional DEBRIDEMENT of RIGHT TRANSMETATARSAL WOUND;  Surgeon: [name], MD;  [facility];  Service: Vascular;  Laterality: Right;    Social History: Social History   Tobacco Use  . Smoking status: Current Every Day Smoker    Packs/day: 0.30    Years: 38.00    Pack years: 11.40    Types: Cigarettes    Start date: 09/14/1982  . Smokeless tobacco: Never Used  Vaping Use  . Vaping Use: Never used  Substance Use Topics  . Alcohol use: Not Currently    Comment: 2-3 per day, none since 2019  . Drug use: Not Currently   Additional social  history:   Please also refer to relevant sections of EMR.  Family History: Family History  Problem Relation Age of Onset  . Diabetes Mother   . Hypertension Mother   . Hyperlipidemia Mother   . Kidney disease Mother   . Thyroid disease Mother   . Breast cancer Mother        mets  . Lung cancer Mother   . Diabetes Father   . Alcohol abuse Sister   . Sickle cell trait Sister   . Diabetes Brother   . Asthma Brother   . Lung cancer Maternal Grandmother   . Kidney disease Maternal Grandmother   . Liver cancer Maternal Uncle        x 4-5  . Kidney disease Maternal Uncle   . Kidney disease Paternal Uncle   . Colon cancer Neg Hx   . Colon polyps Neg Hx   . Esophageal cancer Neg Hx   . Rectal cancer Neg Hx   . Stomach cancer Neg Hx     Allergies and Medications: Allergies  Allergen Reactions  . Eggs Or Egg-Derived Products Rash  .  Morphine And Related Other (See Comments)    Cant take because of pancreatitis  . Cocoa Rash   No current facility-administered medications on file prior to encounter.   Current Outpatient Medications on File Prior to Encounter  Medication Sig Dispense Refill  . acetaminophen (TYLENOL 8 HOUR) 650 MG CR tablet Take 1 tablet (650 mg total) by mouth every 8 (eight) hours as needed for pain.    [name] albuterol (VENTOLIN HFA) 108 (90 Base) MCG/ACT inhaler INHALE 2 TO 4 PUFFS INTO THE LUNGS EVERY 4 HOURS AS NEEDED FOR WHEEZING OR COUGH. 18 g 11  . ARIPiprazole (ABILIFY) 5 MG tablet TAKE 1 TABLET (5 MG TOTAL) BY MOUTH DAILY. (Patient taking differently: Take 5 mg by mouth every evening.) 90 tablet 2  . atorvastatin (LIPITOR) 40 MG tablet Take 1 tablet (40 mg total) by mouth daily. 90 tablet 3  . Baclofen 5 MG TABS TAKE 1 TABLET BY MOUTH 2 TIMES DAILY AS NEEDED 60 tablet 0  . bismuth subsalicylate (PEPTO BISMOL) 262 MG/15ML suspension Take 30 mLs by mouth every 6 (six) hours as needed for indigestion or diarrhea or loose stools.    . cetirizine (ZYRTEC) 10 MG  tablet Take 1 tablet (10 mg total) by mouth daily. (Patient taking differently: Take 10 mg by mouth daily as needed for allergies.) 30 tablet 11  . Continuous Blood Gluc Receiver (DEXCOM G6 RECEIVER) DEVI 1 Device by Does not apply route 4 (four) times daily. 1 each 11  . Continuous Blood Gluc Transmit (DEXCOM G6 TRANSMITTER) MISC 4 (four) times daily. as directed    . daptomycin (CUBICIN) IVPB Inject 1,000 mg into the vein daily. Indication:  Osteomyelitis First Dose: No Last Day of Therapy:  01/29/2021 Labs - Once weekly:  CBC/D, BMP, and CPK Labs - Every other week:  ESR and CRP Method of administration: IV Push Method of administration may be changed at the discretion of home infusion pharmacist based upon assessment of the patient and/or caregiver's ability to self-administer the medication ordered. 55 Units 0  . doxycycline (VIBRA-TABS) 100 MG tablet Take 1 tablet (100 mg total) by mouth 2 (two) times daily. To start after finishing IV antibiotics. (Patient not taking: Reported on 12/26/2020) 60 tablet 11  . DULoxetine (CYMBALTA) 60 MG capsule TAKE 1 CAPSULE (60 MG TOTAL) BY MOUTH DAILY. 90 capsule 2  . EASY TOUCH PEN NEEDLES 31G X 5 MM MISC Inject 1 application as directed 3 (three) times daily. 100 each 11  . empagliflozin (JARDIANCE) 25 MG TABS tablet TAKE 1 TABLET BY MOUTH DAILY. 90 tablet 3  . fluconazole (DIFLUCAN) 200 MG tablet TAKE 1 TABLET (200 MG TOTAL) BY MOUTH DAILY FOR 7 DAYS. (Patient not taking: Reported on 12/26/2020) 7 tablet 0  . fluticasone-salmeterol (ADVAIR HFA) 115-21 MCG/ACT inhaler Inhale 1 puff into the lungs 2 (two) times daily.    . Fluticasone-Salmeterol (ADVAIR) 250-50 MCG/DOSE AEPB INHALE 1 PUFF INTO THE LUNGS 2 TIMES DAILY. (Patient taking differently: Inhale 1 puff into the lungs in the morning and at bedtime.) 180 each 3  . gabapentin (NEURONTIN) 300 MG capsule TAKE 2 CAPSULES (600 MG TOTAL) BY MOUTH 3 (THREE) TIMES DAILY. 180 capsule 2  . glucose blood test  strip USE AS INSTRUCTED    . glucose blood test strip USE AS INSTRUCTED    . glucose blood test strip USE AS INSTRUCTED 100 strip 12  . ibuprofen (ADVIL) 400 MG tablet TAKE 1 TABLET (400 MG TOTAL) BY MOUTH 3 (THREE) TIMES  DAILY. 30 tablet 0  . Insulin Glargine-yfgn 100 UNIT/ML SOPN INJECT 18 UNITS INTO THE SKIN IN THE MORNING. 15 mL 1  . insulin lispro (HUMALOG) 100 UNIT/ML KwikPen INJECT 6-10 UNITS INTO THE SKIN 3 (THREE) TIMES DAILY. 15 mL 11  . Lancets (FREESTYLE) lancets Use as instructed 100 each 12  . lidocaine (LIDODERM) 5 % PLACE 1 PATCH ONTO THE SKIN DAILY. REMOVE & DISCARD PATCH WITHIN 12 HOURS OR AS DIRECTED BY MD (Patient taking differently: 1 patch daily. Remove & discard patch within 12 hours or as directed by MD) 30 patch 0  . metFORMIN (GLUCOPHAGE) 1000 MG tablet TAKE 1 TABLET (1,000 MG TOTAL) BY MOUTH 2 (TWO) TIMES DAILY WITH A MEAL. 180 tablet 3  . mirtazapine (REMERON) 30 MG tablet TAKE 1 TABLET (30 MG TOTAL) BY MOUTH AT BEDTIME. 90 tablet 3  . montelukast (SINGULAIR) 10 MG tablet TAKE 1 TABLET (10 MG TOTAL) BY MOUTH AT BEDTIME. (Patient taking differently: Take 10 mg by mouth in the morning.) 90 tablet 11  . nystatin (MYCOSTATIN) 100000 UNIT/ML suspension TAKE 5 MLS BY MOUTH 4 (FOUR) TIMES DAILY FOR 10 DAYS. (Patient not taking: Reported on 12/26/2020) 200 mL 0  . oxyCODONE-acetaminophen (PERCOCET) 10-325 MG tablet Take 1 tablet by mouth every 6 (six) hours as needed for up to 15 days for pain. 20 tablet 0  . PANCRELIPASE, LIP-PROT-AMYL, PO Take 36,000 Units by mouth with breakfast, with lunch, and with evening meal.    . pantoprazole (PROTONIX) 40 MG tablet TAKE 1 TABLET (40 MG TOTAL) BY MOUTH 2 (TWO) TIMES DAILY. 60 tablet 0  . propranolol ER (INDERAL LA) 80 MG 24 hr capsule TAKE 1 CAPSULE (80 MG TOTAL) BY MOUTH DAILY. 90 capsule 1  . sitaGLIPtin (JANUVIA) 100 MG tablet TAKE 1 TABLET (100 MG TOTAL) BY MOUTH DAILY. 90 tablet 10  . [DISCONTINUED] Insulin Glargine (BASAGLAR  KWIKPEN) 100 UNIT/ML Inject 18 Units into the skin in the morning. 9 mL 2    Objective: BP 126/85   Pulse 86   Temp (!) 97.4 F (36.3 C) (Oral)   Resp 12   Ht $R'6\' 3"'Ra$  (1.905 m)   Wt 106.6 kg   SpO2 99%   BMI 29.37 kg/m  Exam: General: Alert and oriented, no apparent distress  Eyes: PERRLA ENTM: No pharyengeal erythema Cardiovascular: RRR with no murmurs noted Respiratory: Faint expiratory wheezing noted bilaterally, no respiratory distress on room air Gastrointestinal: Bowel sounds present.  Mild abdominal discomfort in the right quadrant, negative Murphy sign (patient has had a cholecystectomy in the past), no acute peritoneal findings MSK: No pain with palpation of the midline spine, upper extremity strength including elbow flexion, elbow extension, and grip 5/5 bilaterally, right hip flexor 4/5 strength, left hip flexor 5/5 strength, right and left plantar and dorsiflexion 5/5 strength  Derm: No rashes noted Neuro: No obvious focal deficits, the patient does have some weakness in his right hip flexor which may be secondary to pain Psych: Behavior and speech appropriate to situation  Labs and Imaging: CBC BMET  Recent Labs  Lab 12/27/20 0821  WBC 8.4  HGB 10.9*  HCT 34.1*  PLT 502*   Recent Labs  Lab 12/27/20 0644  NA 135  K 4.5  CL 103  CO2 23  BUN 11  CREATININE 0.75  GLUCOSE 162*  CALCIUM 9.1     EKG: My own interpretation: sinus tachycardia  Lurline Del, DO 12/27/2020, 9:36 AM PGY-2, [facility] pager: [phone],  text pages welcome

## 2020-12-27 NOTE — ED Triage Notes (Signed)
Patient coming from home, patient with known epidural abscess, has been on IV abx but is continuing to have worsening pain and weakness. Told by ID and neurosurgery to come in to ER.

## 2020-12-27 NOTE — ED Notes (Signed)
Attempted report to inpatient unit x 1 

## 2020-12-27 NOTE — Consult Note (Signed)
Date of Admission:  12/27/2020          Reason for Consult: Worsening epidural abscess with worsening spinal cord stenosis radiographic worsening of T-spine infection     Referring Provider: Dr. Gwendlyn Deutscher   Assessment:  1. MRSA bacteremia causing 2. Worsening osteomyelitis of T9-T10 radiographically despite appropriate IV daptomycin 3. Progressive epidural abscess at at T9-T10 with worsening spinal stenosis  4. Progressive paraspinal phlegmonous changes in T9 and T10 5. New enhancement from T8-T11 that is progressed in the vertebral bodies 6. Osteomyelitis of the pubic ramus 7. 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 Asplenia 9. Diabetes mellitus  Plan:  1. Greatly appreciate neurosurgical consult and my understanding is Dr. Ellene Route is planning on neurosurgery early next week 2. Continue daptomycin 3. Close monitoring of neurological signs in case he shows evidence of need for more urgent neurosurgical intervention.  My partner Dr. West Bali is on this weekend and available for questions.  I will follow up on Monday  Active Problems:   Osteomyelitis ([place])   Scheduled Meds: . ARIPiprazole  5 mg Oral QPM  . atorvastatin  40 mg Oral Daily  . DULoxetine  60 mg Oral Daily  . gabapentin  600 mg Oral TID  . heparin  5,000 Units Subcutaneous Q8H  . insulin aspart  0-9 Units Subcutaneous TID WC  . insulin glargine  9 Units Subcutaneous Daily  . ketorolac  15 mg Intravenous Q6H  . lipase/protease/amylase  36,000 Units Oral TID WC  . loratadine  10 mg Oral Daily  . metFORMIN  1,000 mg Oral BID WC  . mirtazapine  30 mg Oral QHS  . montelukast  10 mg Oral QHS  . pantoprazole  40 mg Oral BID  . propranolol ER  80 mg Oral Daily   Continuous Infusions: . sodium chloride 146 mL/hr at 12/27/20 1607  . DAPTOmycin (CUBICIN)  IV     PRN Meds:.acetaminophen **OR** acetaminophen, albuterol, bismuth subsalicylate, lidocaine, oxyCODONE  HPI: Christopher Fernandez is a 50  y.o. male with diabetes mellitus asplenia history of alcoholism who was admitted in March 18-29 with worsening back pain he was found to have MRSA bacteremia with osteomyelitis and discitis at T9-T10.  There is an epidural and paraspinal soft tissue inflammation seen at that time but no frank abscess.  He was found to have hip pain and MRI showed evidence of osteomyelitis in his superior pubic ramus.  Is also concern for possible myositis involving the right abductor muscle.  Repeat MRI showed improvement of inflammation in the muscle but progression of the findings of osteomyelitis in his pubic ramus.  Original source of his bacteremia is thought to be due to his second toe which was amputated by vascular surgery on 21 March.  He had been on IV daptomycin and remained on this antibiotic when he was seen me on April 8.  He was having some worsening back pain.  Therefore repeat MRI of the thoracic spine was ordered and resulted yesterday.  This is showing progression of vertebral discitis and osteomyelitis and T9-T10 now with a circumferential epidural abscess at T9-T10 was progressing and causing moderate spinal stenosis, progressive paraspinous phlegmonous changes in the T9-T10 area with now new enhancement at T8-T11 vertebral bodies.  I had the patient urgently seen by Janene Madeira yesterday in clinic.  Fortunately did not have neurological findings but given his severe pain (also apparently quite diaphoretic in clinic) Col[place]ta [place] recommended that he be admitted the hospital  through the emergency department.  He came to the ER and has been admitted overnight.  Since I saw him this morning he has been seen by Dr. Ellene Route from neurosurgery who is planning on neurosurgery early next week.  We will continue daptomycin at this point in time.  He needs close neurological follow-up to ensure that he is not developing new neurological signs or symptoms that require urgent neurosurgical  intervention.     Review of Systems: Review of Systems  Constitutional: Negative for chills, diaphoresis, fever, malaise/fatigue and weight loss.  HENT: Negative for congestion, hearing loss, sore throat and tinnitus.   Eyes: Negative for blurred vision and double vision.  Respiratory: Negative for cough, sputum production, shortness of breath and wheezing.   Cardiovascular: Negative for chest pain, palpitations and leg swelling.  Gastrointestinal: Positive for abdominal pain. Negative for blood in stool, constipation, diarrhea, heartburn, melena, nausea and vomiting.  Genitourinary: Negative for dysuria, flank pain and hematuria.  Musculoskeletal: Positive for back pain. Negative for falls, joint pain and myalgias.  Skin: Negative for itching and rash.  Neurological: Negative for dizziness, sensory change, focal weakness, loss of consciousness, weakness and headaches.  Endo/Heme/Allergies: Does not bruise/bleed easily.  Psychiatric/Behavioral: Negative for depression, memory loss and suicidal ideas. The patient is not nervous/anxious.     Past Medical History:  Diagnosis Date  . Alcoholism ([place])   . Anemia   . Anxiety   . Arthritis   . Asthma   . Blood transfusion without reported diagnosis   . COPD (chronic obstructive pulmonary disease) ([place])   . Depression   . Diabetes ([place])    type 2  . GERD (gastroesophageal reflux disease)   . History of hiatal hernia   . HTN (hypertension)   . MRSA bacteremia   . MRSA infection 01/11/2020  . Neuromuscular disorder ([facility])    tremors  . Osteomyelitis ([place])    right forefoot  . Pancreatitis   . Pneumonia   . Substance abuse ([place])   . 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 Wears glasses     Social History   Tobacco Use  . Smoking status: Current Every Day Smoker    Packs/day: 0.30    Years: 38.00    Pack years: 11.40    Types: Cigarettes    Start date: 09/14/1982  . Smokeless tobacco: Never Used  Vaping Use  . Vaping Use:  Never used  Substance Use Topics  . Alcohol use: Not Currently    Comment: 2-3 per day, none since 2019  . Drug use: Not Currently    Family History  Problem Relation Age of Onset  . Diabetes Mother   . Hypertension Mother   . Hyperlipidemia Mother   . Kidney disease Mother   . Thyroid disease Mother   . Breast cancer Mother        mets  . Lung cancer Mother   . Diabetes Father   . Alcohol abuse Sister   . Sickle cell trait Sister   . Diabetes Brother   . Asthma Brother   . Lung cancer Maternal Grandmother   . Kidney disease Maternal Grandmother   . Liver cancer Maternal Uncle        x 4-5  . Kidney disease Maternal Uncle   . Kidney disease Paternal Uncle   . Colon cancer Neg Hx   . Colon polyps Neg Hx   . Esophageal cancer Neg Hx   . Rectal cancer Neg Hx   .  Stomach cancer Neg Hx    Allergies  Allergen Reactions  . Eggs Or Egg-Derived Products Rash  . Morphine And Related Other (See Comments)    Cant take because of pancreatitis  . Cocoa Rash    OBJECTIVE: Blood pressure 123/86, pulse 83, temperature 97.7 F (36.5 C), temperature source Oral, resp. rate 16, height 6\' 3"  (1.905 m), weight 106.6 kg, SpO2 100 %.  Physical Exam Constitutional:      Appearance: He is well-developed.  HENT:     Head: Normocephalic and atraumatic.  Eyes:     Conjunctiva/sclera: Conjunctivae normal.  Cardiovascular:     Rate and Rhythm: Normal rate and regular rhythm.     Heart sounds: No murmur heard. No friction rub. No gallop.   Pulmonary:     Effort: Pulmonary effort is normal. No respiratory distress.     Breath sounds: No stridor. No wheezing or rhonchi.  Abdominal:     General: There is no distension.     Palpations: Abdomen is soft.  Musculoskeletal:        General: No tenderness. Normal range of motion.     Cervical back: Normal range of motion and neck supple.     Right hip: Normal.     Left hip: Normal.  Skin:    General: Skin is warm and dry.     Coloration:  Skin is not pale.     Findings: No erythema or rash.  Neurological:     General: No focal deficit present.     Mental Status: He is alert and oriented to person, place, and time.  Psychiatric:        Mood and Affect: Mood normal.        Behavior: Behavior normal.        Thought Content: Thought content normal.        Judgment: Judgment normal.      Mutation site December 27, 2020:      Lab Results Lab Results  Component Value Date   WBC 8.4 12/27/2020   HGB 10.9 (L) 12/27/2020   HCT 34.1 (L) 12/27/2020   MCV 87.0 12/27/2020   PLT 502 (H) 12/27/2020    Lab Results  Component Value Date   CREATININE 0.79 12/27/2020   BUN 11 12/27/2020   NA 135 12/27/2020   K 4.5 12/27/2020   CL 103 12/27/2020   CO2 23 12/27/2020    Lab Results  Component Value Date   ALT 9 12/27/2020   AST 10 (L) 12/27/2020   ALKPHOS 134 (H) 12/27/2020   BILITOT 0.6 12/27/2020     Microbiology: Recent Results (from the past 240 hour(s))  SARS CORONAVIRUS 2 (TAT 6-24 HRS) Nasopharyngeal Nasopharyngeal Swab     Status: None   Collection Time: 12/27/20  6:16 AM   Specimen: Nasopharyngeal Swab  Result Value Ref Range Status   SARS Coronavirus 2 NEGATIVE NEGATIVE Final    Comment: (NOTE) SARS-CoV-2 target nucleic acids are NOT DETECTED.  The SARS-CoV-2 RNA is generally detectable in upper and lower respiratory specimens during the acute phase of infection. Negative results do not preclude SARS-CoV-2 infection, do not rule out co-infections with other pathogens, and should not be used as the sole basis for treatment or other patient management decisions. Negative results must be combined with clinical observations, patient history, and epidemiological information. The expected result is Negative.  Fact Sheet for Patients: SugarRoll.be  Fact Sheet for Healthcare Providers: https://www.woods-mathews.com/  This test is not yet approved or cleared by  the  [employer] and  has been authorized for detection and/or diagnosis of SARS-CoV-2 by FDA under an Emergency Use Authorization (EUA). This EUA will remain  in effect (meaning this test can be used) for the duration of the COVID-19 declaration under Se ction 564(b)(1) of the Act, 21 U.S.C. section 360bbb-3(b)(1), unless the authorization is terminated or revoked sooner.  Performed at [facility], [place] [address] [place], [place] [address]     Alcide Evener, [place] [facility] [phone] pager  12/27/2020, 5:00 PM

## 2020-12-27 NOTE — Progress Notes (Signed)
Bedside shift report completed. Received patient awake,alert/orientedx4 and able to verbalize needs. NAD noted; respirations easy/even on room air. Movement/sensation to all extremities noted. PICC line infusing at this time. Whiteboard updated. All safety measures in place and personal belongings within reach.

## 2020-12-27 NOTE — Progress Notes (Signed)
Received pt from ED via strectcher,verbal, alert/oriented in no acute distress. Pt situated/orientated to room/equipments.Hospital valuables policy has been reinforced with no complaints voiced. 3 side rails up and call bell/room phone within reach,and all wheels locked.

## 2020-12-27 NOTE — ED Provider Notes (Signed)
[facility] EMERGENCY DEPARTMENT Provider Note   CSN: [id number] Arrival date & time: 12/27/20  0606     History Chief Complaint  Patient presents with  . Back Pain    Christopher Fernandez is a 50 y.o. male.  Patient with history of diabetes and osteomyelitis/discitis of T9 and 10 on outpatient IV antibiotics, presents with persistent back pain and night sweats.  Denies any new numbness or weakness.  Seen by infectious disease a few days ago had a repeat MRI performed which shows extension of his disease to T8 as well as concerns for new phlegmon/developing early abscess.  Patient otherwise denies fevers.  Denies vomiting cough or diarrhea.          Past Medical History:  Diagnosis Date  . Alcoholism ([place])   . Anemia   . Anxiety   . Arthritis   . Asthma   . Blood transfusion without reported diagnosis   . COPD (chronic obstructive pulmonary disease) ([place])   . Depression   . Diabetes ([place])    type 2  . GERD (gastroesophageal reflux disease)   . History of hiatal hernia   . HTN (hypertension)   . MRSA bacteremia   . MRSA infection 01/11/2020  . Neuromuscular disorder (HCC)    tremors  . Osteomyelitis ([place])    right forefoot  . Pancreatitis   . Pneumonia   . Substance abuse ([place])   . 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 Wears glasses     Patient Active Problem List   Diagnosis Date Noted  . Biliary sludge   . Choledocholithiasis   . Infective myositis of right lower extremity   . Acute osteomyelitis of right pelvic region and thigh ([place])   . Discitis of thoracolumbar region   . Pericardial effusion   . MRSA bacteremia   . Right hip pain   . Osteomyelitis ([place]) 11/29/2020  . Chronic pancreatitis ([place]) 10/27/2020  . Abnormal ankle brachial index (ABI)   . Diabetic foot infection ([place])   . Pyogenic inflammation of bone ([place])   . 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 Hyperlipidemia associated with type 2 diabetes mellitus ([place]) 03/15/2020  .  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 S/P transmetatarsal amputation of foot, right ([place]) 01/09/2020  . Asplenia 12/13/2019  . Exocrine pancreatic insufficiency 06/14/2018  . Uncontrolled diabetes mellitus with hyperglycemia, with long-term current use of insulin ([place]) 02/24/2018  . Alcohol use disorder, severe, dependence ([place]) 02/24/2018  . Anxiety associated with depression 02/24/2018  . GERD (gastroesophageal reflux disease) 02/24/2018  . Anemia 02/24/2018  . Asthma, intermittent 02/24/2018  . Tobacco use disorder 02/24/2018  . Benign prostatic hyperplasia 12/30/2015  . Chronic obstructive pulmonary disease ([place]) 12/30/2015  . Diabetic neuropathy ([place]) 12/30/2015  . Restless legs syndrome 12/30/2015  . Long term current use of insulin ([place]) 08/05/2015  . ED (erectile dysfunction) of organic origin 05/23/2015  . Benign essential hypertension 02/16/2014  . Extrinsic asthma without status asthmaticus 02/16/2014    Past Surgical History:  Procedure Laterality Date  . 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 AMPUTATION Right 10/18/2019   Procedure: RIGHT SECOND TOE AMPUTATION;  Surgeon: Newt Minion, MD;  Location: [facility];  Service: Orthopedics;  Laterality: Right;  . AMPUTATION Right 11/08/2019   Procedure: RIGHT TRANSMETATARSAL AMPUTATION;  Surgeon: Newt Minion, MD;  Location: [facility];  Service: Orthopedics;  Laterality: Right;  . AMPUTATION Left 12/02/2020   Procedure: AMPUTATION 2ND  TOE;  Surgeon: Elam Dutch, MD;  Location: [facility];  Service: Vascular;  Laterality: Left;  . APPLICATION OF WOUND VAC Right 01/10/2020   Procedure: APPLICATION OF WOUND VAC, right foot;  Surgeon: [name], MD;  Location: [facility];  Service: Vascular;  Laterality: Right;  . BILIARY DILATION  12/06/2020   Procedure: BILIARY DILATION;  Surgeon: Rush Landmark Telford Nab., MD;  Location: [facility];  Service: Gastroenterology;;  . BIOPSY  12/06/2020   Procedure: BIOPSY;  Surgeon:  Irving Copas., MD;  Location: [facility] ENDOSCOPY;  Service: Gastroenterology;;  . Lorin Mercy  2006   with gallbladder and spleen  . COLONOSCOPY  8-10 years ago    in [place] exam per pt  . ENDOSCOPIC RETROGRADE CHOLANGIOPANCREATOGRAPHY (ERCP) WITH PROPOFOL N/A 12/06/2020   Procedure: ENDOSCOPIC RETROGRADE CHOLANGIOPANCREATOGRAPHY (ERCP) WITH PROPOFOL;  Surgeon: Rush Landmark Telford Nab., MD;  Location: [facility];  Service: Gastroenterology;  Laterality: N/A;  . HERNIA REPAIR  2008, 2010   hiatal hernia and 1 additional  . NASAL SEPTUM SURGERY    . PANCREATIC PSEUDOCYST DRAINAGE    . PANCREATIC STENT PLACEMENT  12/06/2020   Procedure: PANCREATIC STENT PLACEMENT;  Surgeon: Rush Landmark Telford Nab., MD;  Location: [facility];  Service: Gastroenterology;;  . REMOVAL OF STONES  12/06/2020   Procedure: REMOVAL OF STONES;  Surgeon: Irving Copas., MD;  Location: [facility];  Service: Gastroenterology;;  . [name]  12/06/2020   Procedure: [name];  Surgeon: Irving Copas., MD;  Location: [facility] ENDOSCOPY;  Service: Gastroenterology;;  . SPLENECTOMY  2006  . STUMP REVISION Right 11/24/2019   Procedure: REVISION RIGHT TRANSMETATARSAL AMPUTATION;  Surgeon: Newt Minion, MD;  Location: [facility];  Service: Orthopedics;  Laterality: Right;  . TEE WITHOUT CARDIOVERSION N/A 12/04/2020   Procedure: TRANSESOPHAGEAL ECHOCARDIOGRAM (TEE);  Surgeon: Acie Fredrickson Wonda Cheng, MD;  Location: [facility];  Service: Cardiovascular;  Laterality: N/A;  . TOTAL KNEE ARTHROPLASTY Right 1982, 1984  . TRANSMETATARSAL AMPUTATION Left 09/06/2020   Procedure: AMPUTATION LEFT GREAT TOE;  Surgeon: Waynetta Sandy, MD;  Location: [facility];  Service: Vascular;  Laterality: Left;  . WOUND DEBRIDEMENT Right 01/10/2020   Procedure: incisional DEBRIDEMENT of RIGHT TRANSMETATARSAL WOUND;  Surgeon: [name], MD;  Location: [facility] OR;  Service: Vascular;  Laterality: Right;        Family History  Problem Relation Age of Onset  . Diabetes Mother   . Hypertension Mother   . Hyperlipidemia Mother   . Kidney disease Mother   . Thyroid disease Mother   . Breast cancer Mother        mets  . Lung cancer Mother   . Diabetes Father   . Alcohol abuse Sister   . Sickle cell trait Sister   . Diabetes Brother   . Asthma Brother   . Lung cancer Maternal Grandmother   . Kidney disease Maternal Grandmother   . Liver cancer Maternal Uncle        x 4-5  . Kidney disease Maternal Uncle   . Kidney disease Paternal Uncle   . Colon cancer Neg Hx   . Colon polyps Neg Hx   . Esophageal cancer Neg Hx   . Rectal cancer Neg Hx   . Stomach cancer Neg Hx     Social History   Tobacco Use  . Smoking status: Current Every Day Smoker    Packs/day: 0.30    Years: 38.00    Pack years: 11.40    Types: Cigarettes    Start  date: 09/14/1982  . Smokeless tobacco: Never Used  Vaping Use  . Vaping Use: Never used  Substance Use Topics  . Alcohol use: Not Currently    Comment: 2-3 per day, none since 2019  . Drug use: Not Currently    Home Medications Prior to Admission medications   Medication Sig Start Date End Date Taking? Authorizing Provider  acetaminophen (TYLENOL 8 HOUR) 650 MG CR tablet Take 1 tablet (650 mg total) by mouth every 8 (eight) hours as needed for pain. 09/07/20   Ezequiel Essex, MD  albuterol (VENTOLIN HFA) 108 (90 Base) MCG/ACT inhaler INHALE 2 TO 4 PUFFS INTO THE LUNGS EVERY 4 HOURS AS NEEDED FOR WHEEZING OR COUGH. 11/20/20 11/20/21  Mullis, Kiersten P, DO  ARIPiprazole (ABILIFY) 5 MG tablet TAKE 1 TABLET (5 MG TOTAL) BY MOUTH DAILY. Patient taking differently: Take 5 mg by mouth every evening. 11/05/20 11/05/21  Mullis, Kiersten P, DO  atorvastatin (LIPITOR) 40 MG tablet Take 1 tablet (40 mg total) by mouth daily. 12/23/20   Mullis, Kiersten P, DO  Baclofen 5 MG TABS TAKE 1 TABLET BY MOUTH 2 TIMES DAILY AS NEEDED 12/11/20 12/11/21  Martyn Malay, MD   bismuth subsalicylate (PEPTO BISMOL) 262 MG/15ML suspension Take 30 mLs by mouth every 6 (six) hours as needed for indigestion or diarrhea or loose stools.    [provider]  cetirizine (ZYRTEC) 10 MG tablet Take 1 tablet (10 mg total) by mouth daily. Patient taking differently: Take 10 mg by mouth daily as needed for allergies. 10/29/20   Zenia Resides, MD  Continuous Blood Gluc Receiver (DEXCOM G6 RECEIVER) DEVI 1 Device by Does not apply route 4 (four) times daily. 03/15/20   Mullis, Kiersten P, DO  Continuous Blood Gluc Transmit (DEXCOM G6 TRANSMITTER) MISC 4 (four) times daily. as directed 09/09/20   [provider]  daptomycin (CUBICIN) IVPB Inject 1,000 mg into the vein daily. Indication:  Osteomyelitis First Dose: No Last Day of Therapy:  01/29/2021 Labs - Once weekly:  CBC/D, BMP, and CPK Labs - Every other week:  ESR and CRP Method of administration: IV Push Method of administration may be changed at the discretion of home infusion pharmacist based upon assessment of the patient and/or caregiver's ability to self-administer the medication ordered. 12/10/20 02/03/21  Sharion Settler, DO  doxycycline (VIBRA-TABS) 100 MG tablet Take 1 tablet (100 mg total) by mouth 2 (two) times daily. To start after finishing IV antibiotics. Patient not taking: Reported on 12/26/2020 12/20/20   Tommy Medal, Lavell Islam, MD  DULoxetine (CYMBALTA) 60 MG capsule TAKE 1 CAPSULE (60 MG TOTAL) BY MOUTH DAILY. 11/05/20 11/05/21  Mullis, Kiersten P, DO  EASY TOUCH PEN NEEDLES 31G X 5 MM MISC Inject 1 application as directed 3 (three) times daily. 10/27/19   Mullis, Kiersten P, DO  empagliflozin (JARDIANCE) 25 MG TABS tablet TAKE 1 TABLET BY MOUTH DAILY. 11/05/20 11/05/21  Mullis, Kiersten P, DO  fluconazole (DIFLUCAN) 200 MG tablet TAKE 1 TABLET (200 MG TOTAL) BY MOUTH DAILY FOR 7 DAYS. Patient not taking: Reported on 12/26/2020 12/10/20 12/10/21  Sharion Settler, DO  fluticasone-salmeterol (ADVAIR  HFA) 115-21 MCG/ACT inhaler Inhale 1 puff into the lungs 2 (two) times daily.    [provider]  Fluticasone-Salmeterol (ADVAIR) 250-50 MCG/DOSE AEPB INHALE 1 PUFF INTO THE LUNGS 2 TIMES DAILY. Patient taking differently: Inhale 1 puff into the lungs in the morning and at bedtime. 11/20/20 11/20/21  Mullis, Kiersten P, DO  gabapentin (NEURONTIN) 300 MG  capsule TAKE 2 CAPSULES (600 MG TOTAL) BY MOUTH 3 (THREE) TIMES DAILY. 11/13/20 11/13/21  Mullis, Kiersten P, DO  glucose blood test strip USE AS INSTRUCTED 05/15/20   [provider]  glucose blood test strip USE AS INSTRUCTED 09/09/20   [provider]  glucose blood test strip USE AS INSTRUCTED 05/14/20 05/14/21  Mullis, Kiersten P, DO  ibuprofen (ADVIL) 400 MG tablet TAKE 1 TABLET (400 MG TOTAL) BY MOUTH 3 (THREE) TIMES DAILY. 12/10/20 12/10/21  Sharion Settler, DO  Insulin Glargine-yfgn 100 UNIT/ML SOPN INJECT 18 UNITS INTO THE SKIN IN THE MORNING. 11/05/20 11/05/21  Mullis, Kiersten P, DO  insulin lispro (HUMALOG) 100 UNIT/ML KwikPen INJECT 6-10 UNITS INTO THE SKIN 3 (THREE) TIMES DAILY. 11/05/20 11/05/21  Mullis, Kiersten P, DO  Lancets (FREESTYLE) lancets Use as instructed 03/29/18   Harriet Butte, DO  lidocaine (LIDODERM) 5 % PLACE 1 PATCH ONTO THE SKIN DAILY. REMOVE & DISCARD PATCH WITHIN 12 HOURS OR AS DIRECTED BY MD Patient taking differently: 1 patch daily. Remove & discard patch within 12 hours or as directed by MD 12/11/20 12/11/21  Martyn Malay, MD  metFORMIN (GLUCOPHAGE) 1000 MG tablet TAKE 1 TABLET (1,000 MG TOTAL) BY MOUTH 2 (TWO) TIMES DAILY WITH A MEAL. 06/11/20 06/11/21  Mullis, Kiersten P, DO  mirtazapine (REMERON) 30 MG tablet TAKE 1 TABLET (30 MG TOTAL) BY MOUTH AT BEDTIME. 12/12/20 12/12/21  Mullis, Kiersten P, DO  montelukast (SINGULAIR) 10 MG tablet TAKE 1 TABLET (10 MG TOTAL) BY MOUTH AT BEDTIME. Patient taking differently: Take 10 mg by mouth in the morning. 11/05/20 11/05/21  Mullis, Kiersten P, DO  nystatin  (MYCOSTATIN) 100000 UNIT/ML suspension TAKE 5 MLS BY MOUTH 4 (FOUR) TIMES DAILY FOR 10 DAYS. Patient not taking: Reported on 12/26/2020 10/23/20 10/23/21  Mina Marble P, DO  oxyCODONE-acetaminophen (PERCOCET) 10-325 MG tablet Take 1 tablet by mouth every 6 (six) hours as needed for up to 15 days for pain. 12/23/20 01/07/21  Mullis, Kiersten P, DO  PANCRELIPASE, LIP-PROT-AMYL, PO Take 36,000 Units by mouth with breakfast, with lunch, and with evening meal.    [provider]  pantoprazole (PROTONIX) 40 MG tablet TAKE 1 TABLET (40 MG TOTAL) BY MOUTH 2 (TWO) TIMES DAILY. 11/13/20 11/13/21  Mullis, Kiersten P, DO  propranolol ER (INDERAL LA) 80 MG 24 hr capsule TAKE 1 CAPSULE (80 MG TOTAL) BY MOUTH DAILY. 10/23/20 10/23/21  Mullis, Kiersten P, DO  sitaGLIPtin (JANUVIA) 100 MG tablet TAKE 1 TABLET (100 MG TOTAL) BY MOUTH DAILY. 11/05/20 11/05/21  Mullis, Kiersten P, DO  Insulin Glargine (BASAGLAR KWIKPEN) 100 UNIT/ML Inject 18 Units into the skin in the morning. 11/05/20 11/05/20  Mullis, Delynn Flavin P, DO    Allergies    Eggs or egg-derived products, Morphine and related, and Cocoa  Review of Systems   Review of Systems  Constitutional: Negative for chills and fever.  HENT: Negative for ear pain and sore throat.   Eyes: Negative for pain and visual disturbance.  Respiratory: Negative for cough and shortness of breath.   Cardiovascular: Negative for chest pain and palpitations.  Gastrointestinal: Negative for abdominal pain and vomiting.  Genitourinary: Negative for dysuria and hematuria.  Musculoskeletal: Negative for arthralgias and back pain.  Skin: Negative for color change and rash.  Neurological: Negative for seizures and syncope.  All other systems reviewed and are negative.   Physical Exam Updated Vital Signs BP 122/86   Pulse 86   Temp (!) 97.4 F (36.3 C) (Oral)  Resp 16   Ht '6\' 3"'  (1.905 m)   Wt 106.6 kg   SpO2 95%   BMI 29.37 kg/m   Physical Exam Constitutional:       General: He is not in acute distress.    Appearance: He is well-developed.  HENT:     Head: Normocephalic.     Nose: Nose normal.  Eyes:     Extraocular Movements: Extraocular movements intact.  Cardiovascular:     Rate and Rhythm: Normal rate.  Pulmonary:     Effort: Pulmonary effort is normal.  Skin:    Coloration: Skin is not jaundiced.  Neurological:     General: No focal deficit present.     Mental Status: He is alert and oriented to person, place, and time. Mental status is at baseline.     Cranial Nerves: No cranial nerve deficit.     Motor: No weakness.     Comments: 5/5 strength bilateral lower extremities and upper extremities.     ED Results / Procedures / Treatments   Labs (all labs ordered are listed, but only abnormal results are displayed) Labs Reviewed  COMPREHENSIVE METABOLIC PANEL - Abnormal; Notable for the following components:      Result Value   Glucose, Bld 162 (*)    Total Protein 8.4 (*)    Albumin 3.0 (*)    AST 10 (*)    Alkaline Phosphatase 134 (*)    All other components within normal limits  CBC WITH DIFFERENTIAL/PLATELET - Abnormal; Notable for the following components:   RBC 3.92 (*)    Hemoglobin 10.9 (*)    HCT 34.1 (*)    Platelets 502 (*)    All other components within normal limits  CULTURE, BLOOD (SINGLE)  URINE CULTURE  SARS CORONAVIRUS 2 (TAT 6-24 HRS)  LACTIC ACID, PLASMA  PROTIME-INR  APTT  LACTIC ACID, PLASMA  CBC WITH DIFFERENTIAL/PLATELET  URINALYSIS, ROUTINE W REFLEX MICROSCOPIC    EKG EKG Interpretation  Date/Time:  Friday December 27 2020 06:49:13 EDT Ventricular Rate:  103 PR Interval:  134 QRS Duration: 92 QT Interval:  350 QTC Calculation: 458 R Axis:   79 Text Interpretation: Sinus tachycardia Otherwise normal ECG Confirmed by Thamas Jaegers ([id number]) on 12/27/2020 7:07:45 AM   Radiology MR THORACIC SPINE W WO CONTRAST  Result Date: 12/26/2020 CLINICAL DATA:  Follow-up examination for epidural abscess. EXAM:  MRI THORACIC WITHOUT AND WITH CONTRAST TECHNIQUE: Multiplanar and multiecho pulse sequences of the thoracic spine were obtained without and with intravenous contrast. CONTRAST:  11m GADAVIST GADOBUTROL 1 MMOL/ML IV SOLN COMPARISON:  Prior MRI from 11/30/2020. FINDINGS: Alignment: Stable alignment with preservation of the normal thoracic kyphosis. Underlying mild dextroscoliosis noted. No interval listhesis. Vertebrae: Findings consistent with osteomyelitis discitis again seen involving the T9-10 interspace. Since previous exam, there has been progressive endplate irregularity with mild height loss about the T9 and T10 vertebral bodies. Reactive edema and enhancement involving the T9 interspace and adjacent vertebral bodies also appears worsened. Epidural phlegmon/abscess involving the adjacent thecal sac appears slightly worsened and progressed from previous. Extension to involve the posterior elements with involvement of the right worse than left T9-10 facets, consistent with septic arthritis. This is also worsened in appearance. At the level of T9-10, this is circumferential in nature involving both the ventral and dorsal epidural space (series 19, image 33). Associated mild to moderate spinal stenosis has progressed and worsened. No frank cord impingement at this time. On sagittal postcontrast sequences, abnormal epidural enhancement involves the  ventral epidural space extending from the T8 through T11 12 levels (series 23, image 10). Posteriorly at the dorsal epidural space, abnormal enhancement is seen extending from approximately T4-5 through the visualized upper thoracic spine (series 23, images 8-11). Additionally, new subtle postcontrast enhancement is seen involving the posterior/inferior aspect of the T8 vertebral body (series 23, image 10) as well as the posterosuperior aspect of the T11 vertebral body (series 23, image 10), concerning for early changes of osteomyelitis at these levels as well. This is  new from previous. The intervening T8-9 and T10-11 disc spaces remain relatively normal at this time. No other new or distant sites of infection seen elsewhere within the thoracic spine. Underlying bone marrow signal intensity within normal limits. No discrete or worrisome osseous lesions. Cord: Signal intensity within the thoracic spinal cord is within normal limits. Circumferential epidural abscess at the level of T9-10 with associated moderate spinal stenosis as above, progressed from previous. Paraspinal and other soft tissues: Inflammatory change with enhancement seen involving the paraspinous soft tissues adjacent to the T9-10 interspace (series 24, image 38). Overall appearance is most consistent with phlegmon, with no definite discrete soft tissue collection or abscess identified. Overall, appearance is mildly worsened from previous. Paraspinous soft tissues demonstrate no other acute finding. Partially visualized lungs are grossly clear. Visualized visceral structures otherwise unremarkable. Disc levels: No other new disc pathology seen within the thoracic spine. No other stenosis or impingement. IMPRESSION: 1. Persistent findings of osteomyelitis discitis involving the T9-10 level with involvement of the posterior elements, progressed and worsened in appearance as compared to 11/30/2020. 2. Associated circumferential epidural phlegmon/abscess at the level of T9-10, also progressed and worsened now with up to moderate spinal stenosis at this level. 3. Progressive paraspinous phlegmonous change about the T9-10 interspace. No frank soft tissue abscess or drainable fluid collection. 4. Mild enhancement within the adjacent T8 and T11 vertebral bodies as above, also concerning for osteomyelitis. This is new and progressed from previous. These results will be called to the ordering clinician or representative by the Radiologist Assistant, and communication documented in the PACS or [employer].  Electronically Signed   By: Jeannine Boga M.D.   On: 12/26/2020 04:35   DG Chest Port 1 View  Result Date: 12/27/2020 CLINICAL DATA:  Sepsis EXAM: PORTABLE CHEST 1 VIEW COMPARISON:  10/29/2020 FINDINGS: Right upper extremity PICC line tip is seen within the superior vena cava. Lungs are clear. No pneumothorax or pleural effusion. Cardiac size within normal limits. Pulmonary vascularity is normal. IMPRESSION: No active disease. Electronically Signed   By: Fidela Salisbury MD   On: 12/27/2020 06:40    Procedures Procedures   Medications Ordered in ED Medications  oxyCODONE (Oxy IR/ROXICODONE) immediate release tablet 10 mg (10 mg Oral Given 12/27/20 0719)    ED Course  I have reviewed the triage vital signs and the nursing notes.  Pertinent labs & imaging results that were available during my care of the patient were reviewed by me and considered in my medical decision making (see chart for details).    MDM Rules/Calculators/A&P                          Patient labs were sent which are unremarkable.  Case discussed with neurosurgery as well as infectious disease.  Recommending readmission to the hospital for further inpatient evaluation given worsening imaging findings.   Final Clinical Impression(s) / ED Diagnoses Final diagnoses:  Spinal epidural abscess  Rx / DC Orders ED Discharge Orders    None       Luna Fuse, MD 12/27/20 1024

## 2020-12-27 NOTE — H&P (View-Only) (Signed)
Reason for Consult: Progressive osteomyelitis with epidural abscess T9-T10 despite 1 month of antibiotic treatment Referring Physician: Dr. Vivien Fernandez is an 50 y.o. male.  HPI: Christopher Fernandez is a 50 year old male who has had a number of infections involving mostly both lower extremities including a number of surgical debridements and amputations of his toes.  He now has developed a thoracic discitis and ultimately has been on daptomycin as an antibiotic for over a month's time now.  He has had worsening back pain that he has been experiencing despite the IV antibiotic treatment.  He is not been febrile.  A recent MRI demonstrates that there is no progression of the discitis into osteomyelitis in both vertebrae T9 and T10 with formation of an epidural abscess effacing the ventral aspect of the spinal cord but not causing any overt compression.  He is admitted now for progressive disease despite adequate antibiotic treatment.  Past Medical History:  Diagnosis Date  . Alcoholism ([place])   . Anemia   . Anxiety   . Arthritis   . Asthma   . Blood transfusion without reported diagnosis   . COPD (chronic obstructive pulmonary disease) ([place])   . Depression   . Diabetes ([place])    type 2  . GERD (gastroesophageal reflux disease)   . History of hiatal hernia   . HTN (hypertension)   . MRSA bacteremia   . MRSA infection 01/11/2020  . Neuromuscular disorder ([facility])    tremors  . Osteomyelitis ([place])    right forefoot  . Pancreatitis   . Pneumonia   . Substance abuse ([place])   . 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 Wears glasses     Past Surgical History:  Procedure Laterality Date  . 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 AMPUTATION Right 10/18/2019   Procedure: RIGHT SECOND TOE AMPUTATION;  Surgeon: Newt Minion, MD;  Location: [facility];  Service: Orthopedics;  Laterality: Right;  . AMPUTATION Right 11/08/2019   Procedure: RIGHT TRANSMETATARSAL AMPUTATION;  Surgeon: Newt Minion, MD;  Location: [place] [facility];  Service: Orthopedics;  Laterality: Right;  . AMPUTATION Left 12/02/2020   Procedure: AMPUTATION 2ND TOE;  Surgeon: Elam Dutch, MD;  Location: [facility];  Service: Vascular;  Laterality: Left;  . APPLICATION OF WOUND VAC Right 01/10/2020   Procedure: APPLICATION OF WOUND VAC, right foot;  Surgeon: [name], MD;  Location: [facility];  Service: Vascular;  Laterality: Right;  . BILIARY DILATION  12/06/2020   Procedure: BILIARY DILATION;  Surgeon: Rush Landmark Telford Nab., MD;  Location: [facility];  Service: Gastroenterology;;  . BIOPSY  12/06/2020   Procedure: BIOPSY;  Surgeon: Irving Copas., MD;  Location: [facility] ENDOSCOPY;  Service: Gastroenterology;;  . Lorin Mercy  2006   with gallbladder and spleen  . COLONOSCOPY  8-10 years ago    in [place] exam per pt  . ENDOSCOPIC RETROGRADE CHOLANGIOPANCREATOGRAPHY (ERCP) WITH PROPOFOL N/A 12/06/2020   Procedure: ENDOSCOPIC RETROGRADE CHOLANGIOPANCREATOGRAPHY (ERCP) WITH PROPOFOL;  Surgeon: Rush Landmark Telford Nab., MD;  Location: [facility];  Service: Gastroenterology;  Laterality: N/A;  . HERNIA REPAIR  2008, 2010   hiatal hernia and 1 additional  . NASAL SEPTUM SURGERY    . PANCREATIC PSEUDOCYST DRAINAGE    . PANCREATIC STENT PLACEMENT  12/06/2020   Procedure: PANCREATIC STENT PLACEMENT;  Surgeon: Rush Landmark Telford Nab., MD;  Location: [place];  Service: Gastroenterology;;  . REMOVAL OF STONES  12/06/2020   Procedure: REMOVAL  OF STONES;  Surgeon: Rush Landmark Telford Nab., MD;  Location: [facility];  Service: Gastroenterology;;  . [name]  12/06/2020   Procedure: [name];  Surgeon: Irving Copas., MD;  Location: [facility] ENDOSCOPY;  Service: Gastroenterology;;  . SPLENECTOMY  2006  . STUMP REVISION Right 11/24/2019   Procedure: REVISION RIGHT TRANSMETATARSAL AMPUTATION;  Surgeon: Newt Minion, MD;  Location: [facility];  Service: Orthopedics;  Laterality: Right;  . TEE  WITHOUT CARDIOVERSION N/A 12/04/2020   Procedure: TRANSESOPHAGEAL ECHOCARDIOGRAM (TEE);  Surgeon: Acie Fredrickson Wonda Cheng, MD;  Location: [place] [facility];  Service: Cardiovascular;  Laterality: N/A;  . TOTAL KNEE ARTHROPLASTY Right 1982, 1984  . TRANSMETATARSAL AMPUTATION Left 09/06/2020   Procedure: AMPUTATION LEFT GREAT TOE;  Surgeon: Waynetta Sandy, MD;  Location: [place];  Service: Vascular;  Laterality: Left;  . WOUND DEBRIDEMENT Right 01/10/2020   Procedure: incisional DEBRIDEMENT of RIGHT TRANSMETATARSAL WOUND;  Surgeon: [name], MD;  [facility];  Service: Vascular;  Laterality: Right;    Family History  Problem Relation Age of Onset  . Diabetes Mother   . Hypertension Mother   . Hyperlipidemia Mother   . Kidney disease Mother   . Thyroid disease Mother   . Breast cancer Mother        mets  . Lung cancer Mother   . Diabetes Father   . Alcohol abuse Sister   . Sickle cell trait Sister   . Diabetes Brother   . Asthma Brother   . Lung cancer Maternal Grandmother   . Kidney disease Maternal Grandmother   . Liver cancer Maternal Uncle        x 4-5  . Kidney disease Maternal Uncle   . Kidney disease Paternal Uncle   . Colon cancer Neg Hx   . Colon polyps Neg Hx   . Esophageal cancer Neg Hx   . Rectal cancer Neg Hx   . Stomach cancer Neg Hx     Social History:  reports that he has been smoking cigarettes. He started smoking about 38 years ago. He has a 11.40 pack-year smoking history. He has never used smokeless tobacco. He reports previous alcohol use. He reports previous drug use.  Allergies:  Allergies  Allergen Reactions  . Eggs Or Egg-Derived Products Rash  . Morphine And Related Other (See Comments)    Cant take because of pancreatitis  . Cocoa Rash    Medications: I have reviewed the patient's current medications.  Results for orders placed or performed during the hospital encounter of 12/27/20 (from the past 48 hour(s))  SARS CORONAVIRUS 2  (TAT 6-24 HRS) Nasopharyngeal Nasopharyngeal Swab     Status: None   Collection Time: 12/27/20  6:16 AM   Specimen: Nasopharyngeal Swab  Result Value Ref Range   SARS Coronavirus 2 NEGATIVE NEGATIVE    Comment: (NOTE) SARS-CoV-2 target nucleic acids are NOT DETECTED.  The SARS-CoV-2 RNA is generally detectable in upper and lower respiratory specimens during the acute phase of infection. Negative results do not preclude SARS-CoV-2 infection, do not rule out co-infections with other pathogens, and should not be used as the sole basis for treatment or other patient management decisions. Negative results must be combined with clinical observations, patient history, and epidemiological information. The expected result is Negative.  Fact Sheet for Patients: SugarRoll.be  Fact Sheet for Healthcare Providers: https://www.woods-mathews.com/  This test is not yet approved or cleared by the [place] FDA and  has been authorized for detection and/or diagnosis of SARS-CoV-2 by FDA  under an Emergency Use Authorization (EUA). This EUA will remain  in effect (meaning this test can be used) for the duration of the COVID-19 declaration under Se ction 564(b)(1) of the Act, 21 U.S.C. section 360bbb-3(b)(1), unless the authorization is terminated or revoked sooner.  Performed at [facility], [place] [address] [place], [place] [address]   Lactic acid, plasma     Status: None   Collection Time: 12/27/20  6:29 AM  Result Value Ref Range   Lactic Acid, Venous 1.3 0.5 - 1.9 mmol/L    Comment: Performed at [facility] [address], [place], [place] [address]  Comprehensive metabolic panel     Status: Abnormal   Collection Time: 12/27/20  6:44 AM  Result Value Ref Range   Sodium 135 135 - 145 mmol/L   Potassium 4.5 3.5 - 5.1 mmol/L   Chloride 103 98 - 111 mmol/L   CO2 23 22 - 32 mmol/L   Glucose, Bld 162 (H) 70 - 99 mg/dL    Comment:  Glucose reference range applies only to samples taken after fasting for at least 8 hours.   BUN 11 6 - 20 mg/dL   Creatinine, Ser 0.75 0.61 - 1.24 mg/dL   Calcium 9.1 8.9 - 10.3 mg/dL   Total Protein 8.4 (H) 6.5 - 8.1 g/dL   Albumin 3.0 (L) 3.5 - 5.0 g/dL   AST 10 (L) 15 - 41 U/L   ALT 9 0 - 44 U/L   Alkaline Phosphatase 134 (H) 38 - 126 U/L   Total Bilirubin 0.6 0.3 - 1.2 mg/dL   GFR, Estimated >60 >60 mL/min    Comment: (NOTE) Calculated using the CKD-EPI Creatinine Equation (2021)    Anion gap 9 5 - 15    Comment: Performed at [facility], [place] [address] [place], [place] [address]  Protime-INR     Status: None   Collection Time: 12/27/20  6:44 AM  Result Value Ref Range   Prothrombin Time 12.4 11.4 - 15.2 seconds   INR 0.9 0.8 - 1.2    Comment: (NOTE) INR goal varies based on device and disease states. Performed at [facility], [place] [address] [place], [place] [address]   APTT     Status: None   Collection Time: 12/27/20  6:44 AM  Result Value Ref Range   aPTT 31 24 - 36 seconds    Comment: Performed at [facility] [address], [place], [place] [address]  CBC with Differential/Platelet     Status: Abnormal   Collection Time: 12/27/20  8:21 AM  Result Value Ref Range   WBC 8.4 4.0 - 10.5 K/uL   RBC 3.92 (L) 4.22 - 5.81 MIL/uL   Hemoglobin 10.9 (L) 13.0 - 17.0 g/dL   HCT 34.1 (L) 39.0 - 52.0 %   MCV 87.0 80.0 - 100.0 fL   MCH 27.8 26.0 - 34.0 pg   MCHC 32.0 30.0 - 36.0 g/dL   RDW 14.8 11.5 - 15.5 %   Platelets 502 (H) 150 - 400 K/uL   nRBC 0.0 0.0 - 0.2 %   Neutrophils Relative % 76 %   Neutro Abs 6.4 1.7 - 7.7 K/uL   Lymphocytes Relative 14 %   Lymphs Abs 1.2 0.7 - 4.0 K/uL   Monocytes Relative 8 %   Monocytes Absolute 0.7 0.1 - 1.0 K/uL   Eosinophils Relative 1 %   Eosinophils Absolute 0.1 0.0 - 0.5 K/uL  Basophils Relative 0 %   Basophils Absolute 0.0 0.0 - 0.1 K/uL   Immature Granulocytes 1 %   Abs Immature Granulocytes 0.04 0.00  - 0.07 K/uL    Comment: Performed at [facility], [place] [address] [place], [place] [address]  Urinalysis, Routine w reflex microscopic     Status: Abnormal   Collection Time: 12/27/20  9:20 AM  Result Value Ref Range   Color, Urine YELLOW YELLOW   APPearance CLEAR CLEAR   Specific Gravity, Urine 1.037 (H) 1.005 - 1.030   pH 5.0 5.0 - 8.0   Glucose, UA >=500 (A) NEGATIVE mg/dL   Hgb urine dipstick NEGATIVE NEGATIVE   Bilirubin Urine NEGATIVE NEGATIVE   Ketones, ur NEGATIVE NEGATIVE mg/dL   Protein, ur NEGATIVE NEGATIVE mg/dL   Nitrite NEGATIVE NEGATIVE   Leukocytes,Ua NEGATIVE NEGATIVE   RBC / HPF 0-5 0 - 5 RBC/hpf   WBC, UA 0-5 0 - 5 WBC/hpf   Bacteria, UA NONE SEEN NONE SEEN   Squamous Epithelial / LPF 0-5 0 - 5    Comment: Performed at [facility], [place] [address], [place], [place] [address]  HIV Antibody (routine testing w rflx)     Status: None   Collection Time: 12/27/20 11:11 AM  Result Value Ref Range   HIV Screen 4th Generation wRfx Non Reactive Non Reactive    Comment: Performed at [facility], [place] [address], [place], [place] [address]  Creatinine, serum     Status: None   Collection Time: 12/27/20 11:11 AM  Result Value Ref Range   Creatinine, Ser 0.79 0.61 - 1.24 mg/dL   GFR, Estimated >60 >60 mL/min    Comment: (NOTE) Calculated using the CKD-EPI Creatinine Equation (2021) Performed at [facility] [address], [place], [place] [address]   Hemoglobin A1c     Status: Abnormal   Collection Time: 12/27/20 11:11 AM  Result Value Ref Range   Hgb A1c MFr Bld 9.1 (H) 4.8 - 5.6 %    Comment: (NOTE) Pre diabetes:          5.7%-6.4%  Diabetes:              >6.4%  Glycemic control for   <7.0% adults with diabetes    Mean Plasma Glucose 214.47 mg/dL    Comment: Performed at [facility] [address] [place], [place] [address]  CBG monitoring, ED     Status: Abnormal   Collection Time: 12/27/20 12:07 PM  Result Value Ref  Range   Glucose-Capillary 114 (H) 70 - 99 mg/dL    Comment: Glucose reference range applies only to samples taken after fasting for at least 8 hours.    MR THORACIC SPINE W WO CONTRAST  Result Date: 12/26/2020 CLINICAL DATA:  Follow-up examination for epidural abscess. EXAM: MRI THORACIC WITHOUT AND WITH CONTRAST TECHNIQUE: Multiplanar and multiecho pulse sequences of the thoracic spine were obtained without and with intravenous contrast. CONTRAST:  24mL GADAVIST GADOBUTROL 1 MMOL/ML IV SOLN COMPARISON:  Prior MRI from 11/30/2020. FINDINGS: Alignment: Stable alignment with preservation of the normal thoracic kyphosis. Underlying mild dextroscoliosis noted. No interval listhesis. Vertebrae: Findings consistent with osteomyelitis discitis again seen involving the T9-10 interspace. Since previous exam, there has been progressive endplate irregularity with mild height loss about the T9 and T10 vertebral bodies. Reactive edema and enhancement involving the T9 interspace and adjacent vertebral bodies also appears worsened. Epidural phlegmon/abscess involving the adjacent thecal sac appears  slightly worsened and progressed from previous. Extension to involve the posterior elements with involvement of the right worse than left T9-10 facets, consistent with septic arthritis. This is also worsened in appearance. At the level of T9-10, this is circumferential in nature involving both the ventral and dorsal epidural space (series 19, image 33). Associated mild to moderate spinal stenosis has progressed and worsened. No frank cord impingement at this time. On sagittal postcontrast sequences, abnormal epidural enhancement involves the ventral epidural space extending from the T8 through T11 12 levels (series 23, image 10). Posteriorly at the dorsal epidural space, abnormal enhancement is seen extending from approximately T4-5 through the visualized upper thoracic spine (series 23, images 8-11). Additionally, new subtle  postcontrast enhancement is seen involving the posterior/inferior aspect of the T8 vertebral body (series 23, image 10) as well as the posterosuperior aspect of the T11 vertebral body (series 23, image 10), concerning for early changes of osteomyelitis at these levels as well. This is new from previous. The intervening T8-9 and T10-11 disc spaces remain relatively normal at this time. No other new or distant sites of infection seen elsewhere within the thoracic spine. Underlying bone marrow signal intensity within normal limits. No discrete or worrisome osseous lesions. Cord: Signal intensity within the thoracic spinal cord is within normal limits. Circumferential epidural abscess at the level of T9-10 with associated moderate spinal stenosis as above, progressed from previous. Paraspinal and other soft tissues: Inflammatory change with enhancement seen involving the paraspinous soft tissues adjacent to the T9-10 interspace (series 24, image 38). Overall appearance is most consistent with phlegmon, with no definite discrete soft tissue collection or abscess identified. Overall, appearance is mildly worsened from previous. Paraspinous soft tissues demonstrate no other acute finding. Partially visualized lungs are grossly clear. Visualized visceral structures otherwise unremarkable. Disc levels: No other new disc pathology seen within the thoracic spine. No other stenosis or impingement. IMPRESSION: 1. Persistent findings of osteomyelitis discitis involving the T9-10 level with involvement of the posterior elements, progressed and worsened in appearance as compared to 11/30/2020. 2. Associated circumferential epidural phlegmon/abscess at the level of T9-10, also progressed and worsened now with up to moderate spinal stenosis at this level. 3. Progressive paraspinous phlegmonous change about the T9-10 interspace. No frank soft tissue abscess or drainable fluid collection. 4. Mild enhancement within the adjacent T8 and  T11 vertebral bodies as above, also concerning for osteomyelitis. This is new and progressed from previous. These results will be called to the ordering clinician or representative by the Radiologist Assistant, and communication documented in the PACS or [employer]. Electronically Signed   By: Jeannine Boga M.D.   On: 12/26/2020 04:35   DG Chest Port 1 View  Result Date: 12/27/2020 CLINICAL DATA:  Sepsis EXAM: PORTABLE CHEST 1 VIEW COMPARISON:  10/29/2020 FINDINGS: Right upper extremity PICC line tip is seen within the superior vena cava. Lungs are clear. No pneumothorax or pleural effusion. Cardiac size within normal limits. Pulmonary vascularity is normal. IMPRESSION: No active disease. Electronically Signed   By: Fidela Salisbury MD   On: 12/27/2020 06:40    Review of Systems  Constitutional: Positive for activity change.  HENT: Negative.   Respiratory: Negative.   Gastrointestinal: Negative.   Endocrine:       Diabetes  Musculoskeletal: Positive for back pain.  Skin: Negative.   Allergic/Immunologic: Negative.   Hematological: Negative.   Psychiatric/Behavioral: Negative.    Blood pressure 123/86, pulse 83, temperature 97.7 F (36.5 C), temperature source Oral, resp.  rate 16, height 6\' 3"  (1.905 m), weight 106.6 kg, SpO2 100 %. Physical Exam Constitutional:      Appearance: Normal appearance.  HENT:     Head: Normocephalic and atraumatic.     Nose: Nose normal.     Mouth/Throat:     Mouth: Mucous membranes are moist.  Eyes:     Extraocular Movements: Extraocular movements intact.  Cardiovascular:     Rate and Rhythm: Normal rate and regular rhythm.  Pulmonary:     Effort: Pulmonary effort is normal.     Breath sounds: Normal breath sounds.  Musculoskeletal:     Cervical back: Normal range of motion and neck supple.     Comments: Tenderness in the mid thoracic spine with radiation around the lower rib cage and upper abdomen  Skin:    Capillary Refill: Capillary  refill takes less than 2 seconds.  Neurological:     Mental Status: He is alert.     Comments: Motor strength appears strong in the iliopsoas and the quadriceps tibialis anterior and gastrocs in both lower extremities at 4+ out of 5.  Straight leg raising reproduces mid back pain at 45 degrees in either lower extremity.  Patrick's maneuver is negative.  Upper extremity strength and reflexes are intact cranial nerve examination is normal.     Assessment/Plan: Osteomyelitis discitis and epidural abscess T9-T10.  Plan: He will likely need surgical decompression via thoracotomy to do a thorough resection of the infected vertebrae with placement of an interbody graft and lateral plate reconstruction.  This will require some planning and I believe that we can schedule this to be done in the beginning of the coming week.  In the meantime to complete the planning will obtain a CT scan of the thoracic spine.  I have discussed the situation with the patient and will proceed with planning for surgical intervention  [name] 12/27/2020, 3:14 PM

## 2020-12-28 DIAGNOSIS — R338 Other retention of urine: Secondary | ICD-10-CM | POA: Diagnosis present

## 2020-12-28 DIAGNOSIS — D649 Anemia, unspecified: Secondary | ICD-10-CM | POA: Diagnosis not present

## 2020-12-28 DIAGNOSIS — R7881 Bacteremia: Secondary | ICD-10-CM | POA: Diagnosis not present

## 2020-12-28 DIAGNOSIS — A4902 Methicillin resistant Staphylococcus aureus infection, unspecified site: Secondary | ICD-10-CM | POA: Diagnosis not present

## 2020-12-28 DIAGNOSIS — M4620 Osteomyelitis of vertebra, site unspecified: Secondary | ICD-10-CM | POA: Diagnosis not present

## 2020-12-28 DIAGNOSIS — Z9689 Presence of other specified functional implants: Secondary | ICD-10-CM | POA: Diagnosis not present

## 2020-12-28 DIAGNOSIS — R21 Rash and other nonspecific skin eruption: Secondary | ICD-10-CM | POA: Diagnosis not present

## 2020-12-28 DIAGNOSIS — M869 Osteomyelitis, unspecified: Secondary | ICD-10-CM | POA: Diagnosis not present

## 2020-12-28 DIAGNOSIS — K861 Other chronic pancreatitis: Secondary | ICD-10-CM | POA: Diagnosis present

## 2020-12-28 DIAGNOSIS — G992 Myelopathy in diseases classified elsewhere: Secondary | ICD-10-CM | POA: Diagnosis not present

## 2020-12-28 DIAGNOSIS — I10 Essential (primary) hypertension: Secondary | ICD-10-CM | POA: Diagnosis present

## 2020-12-28 DIAGNOSIS — B9562 Methicillin resistant Staphylococcus aureus infection as the cause of diseases classified elsewhere: Secondary | ICD-10-CM | POA: Diagnosis not present

## 2020-12-28 DIAGNOSIS — N401 Enlarged prostate with lower urinary tract symptoms: Secondary | ICD-10-CM | POA: Diagnosis present

## 2020-12-28 DIAGNOSIS — M4716 Other spondylosis with myelopathy, lumbar region: Secondary | ICD-10-CM | POA: Diagnosis present

## 2020-12-28 DIAGNOSIS — Q8901 Asplenia (congenital): Secondary | ICD-10-CM | POA: Diagnosis not present

## 2020-12-28 DIAGNOSIS — E1140 Type 2 diabetes mellitus with diabetic neuropathy, unspecified: Secondary | ICD-10-CM | POA: Diagnosis not present

## 2020-12-28 DIAGNOSIS — E1142 Type 2 diabetes mellitus with diabetic polyneuropathy: Secondary | ICD-10-CM | POA: Diagnosis present

## 2020-12-28 DIAGNOSIS — N179 Acute kidney failure, unspecified: Secondary | ICD-10-CM | POA: Diagnosis not present

## 2020-12-28 DIAGNOSIS — D75839 Thrombocytosis, unspecified: Secondary | ICD-10-CM | POA: Diagnosis not present

## 2020-12-28 DIAGNOSIS — J9601 Acute respiratory failure with hypoxia: Secondary | ICD-10-CM | POA: Diagnosis not present

## 2020-12-28 DIAGNOSIS — Z794 Long term (current) use of insulin: Secondary | ICD-10-CM | POA: Diagnosis not present

## 2020-12-28 DIAGNOSIS — M4624 Osteomyelitis of vertebra, thoracic region: Secondary | ICD-10-CM | POA: Diagnosis not present

## 2020-12-28 DIAGNOSIS — R202 Paresthesia of skin: Secondary | ICD-10-CM | POA: Diagnosis not present

## 2020-12-28 DIAGNOSIS — F32A Depression, unspecified: Secondary | ICD-10-CM | POA: Diagnosis present

## 2020-12-28 DIAGNOSIS — M8618 Other acute osteomyelitis, other site: Secondary | ICD-10-CM | POA: Diagnosis not present

## 2020-12-28 DIAGNOSIS — M86151 Other acute osteomyelitis, right femur: Secondary | ICD-10-CM | POA: Diagnosis not present

## 2020-12-28 DIAGNOSIS — G061 Intraspinal abscess and granuloma: Secondary | ICD-10-CM | POA: Diagnosis not present

## 2020-12-28 DIAGNOSIS — Z20822 Contact with and (suspected) exposure to covid-19: Secondary | ICD-10-CM | POA: Diagnosis present

## 2020-12-28 DIAGNOSIS — J449 Chronic obstructive pulmonary disease, unspecified: Secondary | ICD-10-CM | POA: Diagnosis present

## 2020-12-28 DIAGNOSIS — M4804 Spinal stenosis, thoracic region: Secondary | ICD-10-CM | POA: Diagnosis present

## 2020-12-28 DIAGNOSIS — K219 Gastro-esophageal reflux disease without esophagitis: Secondary | ICD-10-CM | POA: Diagnosis present

## 2020-12-28 DIAGNOSIS — A312 Disseminated mycobacterium avium-intracellulare complex (DMAC): Secondary | ICD-10-CM | POA: Diagnosis not present

## 2020-12-28 DIAGNOSIS — E1169 Type 2 diabetes mellitus with other specified complication: Secondary | ICD-10-CM | POA: Diagnosis present

## 2020-12-28 DIAGNOSIS — E46 Unspecified protein-calorie malnutrition: Secondary | ICD-10-CM | POA: Diagnosis present

## 2020-12-28 DIAGNOSIS — M1990 Unspecified osteoarthritis, unspecified site: Secondary | ICD-10-CM | POA: Diagnosis present

## 2020-12-28 DIAGNOSIS — G2581 Restless legs syndrome: Secondary | ICD-10-CM | POA: Diagnosis present

## 2020-12-28 DIAGNOSIS — J90 Pleural effusion, not elsewhere classified: Secondary | ICD-10-CM | POA: Diagnosis not present

## 2020-12-28 DIAGNOSIS — M4644 Discitis, unspecified, thoracic region: Secondary | ICD-10-CM | POA: Diagnosis present

## 2020-12-28 DIAGNOSIS — D62 Acute posthemorrhagic anemia: Secondary | ICD-10-CM | POA: Diagnosis not present

## 2020-12-28 DIAGNOSIS — J9811 Atelectasis: Secondary | ICD-10-CM | POA: Diagnosis not present

## 2020-12-28 DIAGNOSIS — K5900 Constipation, unspecified: Secondary | ICD-10-CM | POA: Diagnosis not present

## 2020-12-28 DIAGNOSIS — G062 Extradural and subdural abscess, unspecified: Secondary | ICD-10-CM | POA: Diagnosis not present

## 2020-12-28 DIAGNOSIS — R0602 Shortness of breath: Secondary | ICD-10-CM | POA: Diagnosis not present

## 2020-12-28 DIAGNOSIS — Z9889 Other specified postprocedural states: Secondary | ICD-10-CM | POA: Diagnosis not present

## 2020-12-28 DIAGNOSIS — E785 Hyperlipidemia, unspecified: Secondary | ICD-10-CM | POA: Diagnosis present

## 2020-12-28 DIAGNOSIS — R710 Precipitous drop in hematocrit: Secondary | ICD-10-CM | POA: Diagnosis not present

## 2020-12-28 LAB — CBC
HCT: 31.2 % — ABNORMAL LOW (ref 39.0–52.0)
Hemoglobin: 10.2 g/dL — ABNORMAL LOW (ref 13.0–17.0)
MCH: 28 pg (ref 26.0–34.0)
MCHC: 32.7 g/dL (ref 30.0–36.0)
MCV: 85.7 fL (ref 80.0–100.0)
Platelets: 461 10*3/uL — ABNORMAL HIGH (ref 150–400)
RBC: 3.64 MIL/uL — ABNORMAL LOW (ref 4.22–5.81)
RDW: 14.3 % (ref 11.5–15.5)
WBC: 7 10*3/uL (ref 4.0–10.5)
nRBC: 0 % (ref 0.0–0.2)

## 2020-12-28 LAB — BASIC METABOLIC PANEL
Anion gap: 7 (ref 5–15)
BUN: 15 mg/dL (ref 6–20)
CO2: 25 mmol/L (ref 22–32)
Calcium: 8.6 mg/dL — ABNORMAL LOW (ref 8.9–10.3)
Chloride: 101 mmol/L (ref 98–111)
Creatinine, Ser: 0.77 mg/dL (ref 0.61–1.24)
GFR, Estimated: >60 mL/min (ref >60)
Glucose, Bld: 143 mg/dL — ABNORMAL HIGH (ref 70–99)
Potassium: 4.2 mmol/L (ref 3.5–5.1)
Sodium: 133 mmol/L — ABNORMAL LOW (ref 135–145)

## 2020-12-28 LAB — GLUCOSE, CAPILLARY
Glucose-Capillary: 152 mg/dL — ABNORMAL HIGH (ref 70–99)
Glucose-Capillary: 152 mg/dL — ABNORMAL HIGH (ref 70–99)
Glucose-Capillary: 175 mg/dL — ABNORMAL HIGH (ref 70–99)
Glucose-Capillary: 188 mg/dL — ABNORMAL HIGH (ref 70–99)

## 2020-12-28 LAB — URINE CULTURE: Culture: 1000 — AB

## 2020-12-28 LAB — CK: Total CK: 38 U/L — ABNORMAL LOW (ref 49–397)

## 2020-12-28 MED ORDER — SODIUM CHLORIDE 0.9% FLUSH
10.0000 mL | INTRAVENOUS | Status: DC | PRN
Start: 2020-12-28 — End: 2021-01-10
  Administered 2021-01-01: 10 mL

## 2020-12-28 MED ORDER — BACLOFEN 10 MG PO TABS
5.0000 mg | ORAL_TABLET | Freq: Two times a day (BID) | ORAL | Status: DC | PRN
  Administered 2020-12-28 – 2021-01-04 (×10): 5 mg via ORAL
  Filled 2020-12-28 (×4): qty 1
  Filled 2020-12-28: qty 0.5
  Filled 2020-12-28 (×4): qty 1
  Filled 2020-12-28: qty 0.5
  Filled 2020-12-28: qty 1

## 2020-12-28 MED ORDER — KETOROLAC TROMETHAMINE 15 MG/ML IJ SOLN
30.0000 mg | Freq: Four times a day (QID) | INTRAMUSCULAR | Status: AC
  Administered 2020-12-28 – 2020-12-30 (×8): 30 mg via INTRAVENOUS
  Filled 2020-12-28 (×8): qty 2

## 2020-12-28 MED ORDER — CHLORHEXIDINE GLUCONATE CLOTH 2 % EX PADS
6.0000 | MEDICATED_PAD | Freq: Every day | CUTANEOUS | Status: DC
  Administered 2020-12-28 – 2021-01-09 (×11): 6 via TOPICAL

## 2020-12-28 MED ORDER — LIDOCAINE 5 % EX PTCH
1.0000 | MEDICATED_PATCH | CUTANEOUS | Status: DC
  Administered 2020-12-29 – 2021-01-17 (×19): 1 via TRANSDERMAL
  Filled 2020-12-28 (×22): qty 1

## 2020-12-28 MED ORDER — FLUTICASONE FUROATE-VILANTEROL 200-25 MCG/INH IN AEPB
1.0000 | INHALATION_SPRAY | Freq: Every day | RESPIRATORY_TRACT | Status: DC
  Administered 2020-12-29 – 2021-01-09 (×12): 1 via RESPIRATORY_TRACT
  Filled 2020-12-28 (×2): qty 28

## 2020-12-28 NOTE — Progress Notes (Signed)
Patient ID: Christopher Fernandez, male   DOB: 1971/05/07, 50 y.o.   MRN: [id number] BP 104/86 (BP Location: Left Arm)   Pulse 85   Temp 98 F (36.7 C) (Oral)   Resp 16   Ht 6\' 3"  (1.905 m)   Wt 106.6 kg   SpO2 96%   BMI 29.37 kg/m  aLert and oriented x4 speech is clear and fluent Moving lower extremities well Explained operation to Mr Kozak and to his wife With abx failure and progression of changes in the thoracic spine operative stabilization, abscess evacuation, and a decreased source of infection it is hoped that future abx treatment will be more successful.  Normal 5/5 strength in the lower extremites. Normal proprioception in feet bilaterally.  Tentative plan is for decompression on Tuesday.

## 2020-12-28 NOTE — Progress Notes (Signed)
   I was notified of patient's admission and he has planned suture removal early next week.  Sutures were removed at bedside.  There is an easily palpable left dorsalis pedis pulse and the wound is healing well.  He will follow-up with [place] on an as-needed basis.  Amari Burnsworth C. Donzetta Matters, MD [facility] Office: [phone] Pager: [phone]

## 2020-12-28 NOTE — Progress Notes (Signed)
Family Medicine Teaching Service Daily Progress Note Intern Pager: [phone]  Patient name: Christopher Fernandez Medical record number: [id number] Date of birth: [birth year] Age: 50 y.o. Gender: male  Primary Care Provider: Danna Hefty, DO Consultants: Infectious disease, neurosurgery Code Status: Full code  Pt Overview and Major Events to Date:  4/15-admitted  Assessment and Plan: Christopher Fernandez is a 50 y.o. male presenting with worsening osteomyelitis and concern of new abscess/phlegmon in thoracic spine after having a repeat MRI a few days ago. PMH is significant for history of thoracic osteomyelitis, type 2 diabetes, peripheral neuropathy, chronic pancreatitis/pancreatic insufficiency, hyperlipidemia, hypertension, asthma, seasonal allergies, GERD.  Osteomyelitis/discitis with new abscess/phlegmon in thoracic spine: Patient with previous MRI evidence of of discitis and osteomyelitis at T9-T10 without epidural abscess at that time.  Patient was seen by infectious disease yesterday and complained of worsening pain.  Patient had MRI on 12/25/2020 that showed worsening progression of osteomyelitis to continuous vertebra at T8 and T11 with circumferential epidural abscess/phlegmon at T9-T10 with moderate spinal stenosis at that level.    Patient with stable pain at this time.  States that the Toradol is working well but that around 5 hours after getting the dose it seems to be wearing off.  He states he would like to increase the Toradol to 30 mg per dose if possible as he had good benefit with this in the past. -Neurosurgery on board, appreciate recommendations  -Likely surgical decompression sometime early in the next week -We will continue heparin for DVT prophylaxis until we hear from neurosurgery if they recommend anything different based off when I think he will have his surgery. -Infectious disease on board, appreciate recommendations  -IV daptomycin  -Close neurological monitoring to  ensure he is not developing neurological signs or symptoms -PT/OT evaluate and treat -Follow-up urine/blood cultures -Increase Toradol to 30 mg every 6 hours -Oxycodone 5-10 mg as needed for breakthrough -Lidocaine patch -Continue home baclofen  Osteomyelitis right hip Patient with MRI evidence from 12/11/2010 worsening osteomyelitis of the parasymphyseal right superior pubic ramus with improvement in right abductor minimus myositis. -ID recommendations as above -IV daptomycin  Moderate thoracic spinal stenosis: Noted on most recent MRI the patient was having worsening spinal stenosis at T9-T10 in conjunction with his epidural abscess/phlegmon.  This was classified as moderate on the MRI scan.  Patient with no loss of bowel/bladder function, no saddle paresthesias, patient with some weakness, 4/5 in the right hip flexor, 5/5 in left.  Patient with 5/5 strength in plantar and dorsiflexion bilaterally.  Weakness in the right hip may be secondary to pain. -Neurosurgery consult as above, appreciate recommendations  T2DM with neuropathy Home medications include Januvia 100 mg daily, metformin 1000 mg twice daily, sliding scale insulin, 18 units insulin glargine in the mornings, Jardiance 25 mg daily.  Patient also takes gabapentin for peripheral neuropathy.  Most recent A1c of 8.7 on 12/23/2020.  Most recent glucoses ranged 80s-90s. -Initially reduce patient's Lantus by 50% while he was n.p.o., however blood glucose levels have remained appropriate so we will continue this at this time -Continue home metformin, can hold if patient is going to get IV contrast or any other modality that may add renal stress. -Hold Jardiance and Januvia -Sliding scale insulin -Continue home gabapentin.  Chronic pancreatitis/pancreatic insufficiency Home medications include pancrelipase 3 times per day with meals. -We will continue with pancreatic enzymes from our formulary  History of anemia Hemoglobin on  admission of 10.9, currently 10.2.  Baseline appears to  be around 10.0-12.0 -A.m. CBC shows hemoglobin of 10.2  Hyperlipidemia Home medications include Lipitor 40.  Last cholesterol panel shows LDL of 34, HDL 34. -Continue home Lipitor  Asthma and history of seasonal allergies Home medications include albuterol as needed, Advair daily, Singulair, Zyrtec. -Continue home medications -Dulera per formulary   History of alcohol abuse/tobacco use disorder Patient states his last drink was about 1.5 years ago.  Continues to smoke about half pack per day, previously smoked about a pack per day for 35 years. -Can give nicotine patch if patient request.  Anxiety/depression Home medications include duloxetine 60 mg daily, Abilify 5 mg daily. -Continue home medications  GERD: Home medications include pantoprazole 40 mg twice daily -Continue home medications  History of tremors: Home medications include propranolol -Continue home propranolol  Left foot recent toe amputation. Occurred on 3/23.  Patient without complaints of pain in that region today.  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 -Continue to monitor  Unintentional weight loss: Patient notes that he is lost around 30-35 pounds over the past few months.  He states that he has had some work-up for this.  Chart review shows patient has not had a colonoscopy.  Unintentional weight loss over the past few months may be related to his osteomyelitis, however strongly recommend that he get up-to-date with his cancer screening in the outpatient setting. -We will ensure that when he follows up at our clinic that we get him up-to-date on his cancer screening -We will continue to monitor his weight loss -Can consider nutritional consult  FEN/GI: Carb modified PPx: Continue subcu heparin for DVT prophylaxis until we hear otherwise from neurosurgery about when his procedure may be  Status is:  Observation  The patient will require care spanning > 2 midnights and should be moved to inpatient because: Planning neurosurgical intervention  Dispo: The patient is from: Home              Anticipated d/c is to: Home              Patient currently is not medically stable to d/c.   Difficult to place patient No  Subjective:  Patient states his back pain is a bit better controlled today.  He states that Toradol seems to wear off around 5 hours.  He states that the oxycodone does seem to help bridge this discomfort.  He denies other complaints currently.  Objective: Temp:  [97.7 F (36.5 C)-98.3 F (36.8 C)] 98.2 F (36.8 C) (04/15 2003) Pulse Rate:  [83-96] 90 (04/15 2003) Resp:  [9-26] 20 (04/15 2003) BP: (98-130)/(67-97) 113/85 (04/15 2003) SpO2:  [95 %-100 %] 96 % (04/15 2003) Physical Exam: General: Alert and oriented in no apparent distress Heart: Regular rate and rhythm with no murmurs appreciated Lungs: CTA bilaterally, no wheezing Skin: Warm and dry Extremities: No lower extremity edema   Laboratory: Recent Labs  Lab 12/27/20 0821 12/28/20 0506  WBC 8.4 7.0  HGB 10.9* 10.2*  HCT 34.1* 31.2*  PLT 502* 461*   Recent Labs  Lab 12/27/20 0644 12/27/20 1111  NA 135  --   K 4.5  --   CL 103  --   CO2 23  --   BUN 11  --   CREATININE 0.75 0.79  CALCIUM 9.1  --   PROT 8.4*  --   BILITOT 0.6  --   ALKPHOS 134*  --   ALT 9  --   AST 10*  --  GLUCOSE 162*  --      Lurline Del, DO 12/28/2020, 6:27 AM PGY-2, [facility] Intern pager: [phone], text pages welcome

## 2020-12-29 DIAGNOSIS — G062 Extradural and subdural abscess, unspecified: Secondary | ICD-10-CM

## 2020-12-29 DIAGNOSIS — M869 Osteomyelitis, unspecified: Secondary | ICD-10-CM | POA: Diagnosis not present

## 2020-12-29 DIAGNOSIS — G061 Intraspinal abscess and granuloma: Principal | ICD-10-CM

## 2020-12-29 DIAGNOSIS — M8618 Other acute osteomyelitis, other site: Secondary | ICD-10-CM | POA: Diagnosis not present

## 2020-12-29 LAB — GLUCOSE, CAPILLARY
Glucose-Capillary: 176 mg/dL — ABNORMAL HIGH (ref 70–99)
Glucose-Capillary: 167 mg/dL — ABNORMAL HIGH (ref 70–99)
Glucose-Capillary: 168 mg/dL — ABNORMAL HIGH (ref 70–99)
Glucose-Capillary: 200 mg/dL — ABNORMAL HIGH (ref 70–99)

## 2020-12-29 MED ORDER — DOCUSATE SODIUM 100 MG PO CAPS
100.0000 mg | ORAL_CAPSULE | Freq: Every day | ORAL | Status: DC
  Administered 2020-12-29 – 2021-01-02 (×5): 100 mg via ORAL
  Filled 2020-12-29 (×6): qty 1

## 2020-12-29 MED ORDER — POLYETHYLENE GLYCOL 3350 17 G PO PACK: 17.0000 g | PACK | Freq: Every day | ORAL | Status: DC

## 2020-12-29 MED ORDER — HYDROMORPHONE HCL 1 MG/ML IJ SOLN
1.0000 mg | INTRAMUSCULAR | Status: AC | PRN
  Administered 2020-12-29 – 2020-12-30 (×3): 1 mg via INTRAVENOUS
  Filled 2020-12-29 (×3): qty 1

## 2020-12-29 NOTE — Progress Notes (Signed)
Patient ID: Christopher Fernandez, male   DOB: 1971/08/27, 50 y.o.   MRN: [id number] BP 122/85 (BP Location: Right Arm)   Pulse 76   Temp 98 F (36.7 C) (Oral)   Resp 18   Ht 6\' 3"  (1.905 m)   Wt 106.6 kg   SpO2 98%   BMI 29.37 kg/m  Alert and oriented x 4 Normal strength in the lower extremities Awaiting surgery Stable exam

## 2020-12-29 NOTE — Evaluation (Signed)
Occupational Therapy Evaluation Patient Details Name: Christopher Fernandez MRN: [id number] DOB: March 20, 1971 Today's Date: 12/29/2020    History of Present Illness Christopher Fernandez is a 50 y.o. male presenting with worsening osteomyelitis and concern of new abscess/phlegmon in thoracic spine after having a repeat MRI a few days ago. Wroking diagnosis of discitis/infection; plan for Spinal surgery the week of 4/18; also with MRI evidence from 12/11/2010 worsening osteomyelitis of the parasymphyseal right superior pubic ramus with improvement in right abductor minimus myositis.PMH is significant for history of thoracic osteomyelitis, type 2 diabetes, peripheral neuropathy, chronic pancreatitis/pancreatic insufficiency, hyperlipidemia, hypertension, asthma, seasonal allergies, GERD.   Clinical Impression   PTA patient independent with ADLs using quad cane for mobility, but reports having difficulty with bathing standing in shower since dc home.  Admitted for above and limited by pain, back precautions, and activity tolerance.  Completes ADLs with up to min guard assist, transfers with min guard assist and in room mobility with min guard using quad cane initially but transitioning to RW due to reaching out for B UE support.  Educated on back precautions and ADL compensatory techniques for comfort. Based on performance today, believe patient will benefit from further OT services while admitted to optimize independence and tolerance for ADLs.  Anticipate he will progress well after surgery, but will wait to recommended follow up services after surgery (possible Tuesday).     Follow Up Recommendations  Other (comment) (TBD post surgery)    Equipment Recommendations  3 in 1 bedside commode    Recommendations for Other Services       Precautions / Restrictions Precautions Precautions: Back Precaution Booklet Issued: No Precaution Comments: log roll in/out of bed; Back prec for comfort Other Brace: Has a darco  shoe from previous admission (approx 3 wks ago); Darco shoe is not in room, and pt was noncomital re: if MD officially cleared him to walk without it; Has been consistently walking without it so far this admission Restrictions Weight Bearing Restrictions: Yes LLE Weight Bearing: Weight bearing as tolerated      Mobility Bed Mobility Overal bed mobility: Needs Assistance Bed Mobility: Rolling;Sidelying to Sit;Sit to Sidelying Rolling: Supervision Sidelying to sit: Supervision       General bed mobility comments: Increased time; Supervision and cues for log rolling technique    Transfers Overall transfer level: Needs assistance Equipment used: Quad cane Transfers: Sit to/from Stand Sit to Stand: Min guard         General transfer comment: for safety, balance    Balance Overall balance assessment: Needs assistance Sitting-balance support: No upper extremity supported;Feet supported Sitting balance-Leahy Scale: Good     Standing balance support: No upper extremity supported;Bilateral upper extremity supported;During functional activity Standing balance-Leahy Scale: Fair Standing balance comment: preference to BUE Support dynamically                           ADL either performed or assessed with clinical judgement   ADL Overall ADL's : Needs assistance/impaired     Grooming: Wash/dry hands;Min guard;Standing       Lower Body Bathing: Min guard;Sit to/from stand       Lower Body Dressing: Min guard;Sit to/from stand   Toilet Transfer: Min guard;Ambulation;RW           Functional mobility during ADLs: Min guard;Rolling walker;Cane;Cueing for safety General ADL Comments: pt reaching out of BUE support walking with cane, switched to RW for safety; figure 4  technique for LB dressing but reports increased difficulty with bathing in shower since dc     Vision   Vision Assessment?: No apparent visual deficits     Perception     Praxis       Pertinent Vitals/Pain Pain Assessment: 0-10 Pain Score: 4  Pain Location: low back/bil flank/hips Pain Descriptors / Indicators: Constant Pain Intervention(s): Monitored during session;Repositioned     Hand Dominance Right   Extremity/Trunk Assessment Upper Extremity Assessment Upper Extremity Assessment: Overall WFL for tasks assessed   Lower Extremity Assessment Lower Extremity Assessment: Defer to PT evaluation       Communication Communication Communication: No difficulties   Cognition Arousal/Alertness: Awake/alert Behavior During Therapy: WFL for tasks assessed/performed Overall Cognitive Status: Within Functional Limits for tasks assessed                                     General Comments  VSS    Exercises     Shoulder Instructions      Home Living Family/patient expects to be discharged to:: Private residence Living Arrangements: Spouse/significant other Available Help at Discharge:  (works days) Type of Home: House Home Access: Stairs to enter Technical brewer of Steps: 1 Entrance Stairs-Rails: None Home Layout: One level     Bathroom Shower/Tub: Teacher, early years/pre: Standard     Home Equipment: Radio producer - quad          Prior Functioning/Environment Level of Independence: Independent with assistive device(s)        Comments: has been using quad cane for the past few weeks; able to continue to complete ADL from sit/stand level        OT Problem List: Impaired balance (sitting and/or standing);Decreased activity tolerance;Pain;Decreased knowledge of precautions;Decreased knowledge of use of DME or AE      OT Treatment/Interventions: Self-care/ADL training;Energy conservation;Therapeutic activities;Patient/family education;Balance training;DME and/or AE instruction    OT Goals(Current goals can be found in the care plan section) Acute Rehab OT Goals Patient Stated Goal: Pt tells me he is looking forward  to surgery OT Goal Formulation: With patient Time For Goal Achievement: 01/12/21 Potential to Achieve Goals: Good  OT Frequency: Min 2X/week   Barriers to D/C:            Co-evaluation              AM-PAC OT "6 Clicks" Daily Activity     Outcome Measure Help from another person eating meals?: None Help from another person taking care of personal grooming?: A Little Help from another person toileting, which includes using toliet, bedpan, or urinal?: A Little Help from another person bathing (including washing, rinsing, drying)?: A Little Help from another person to put on and taking off regular upper body clothing?: A Little Help from another person to put on and taking off regular lower body clothing?: A Little 6 Click Score: 19   End of Session Equipment Utilized During Treatment: Gait belt;Rolling walker;Other (comment) (quad caen) Nurse Communication: Mobility status  Activity Tolerance: Patient tolerated treatment well Patient left: with call bell/phone within reach;Other (comment) (with PT)  OT Visit Diagnosis: Other abnormalities of gait and mobility (R26.89);Pain Pain - part of body:  (back)                Time: 0865-7846 OT Time Calculation (min): 13 min Charges:  OT General Charges $OT Visit: 1 Visit OT Evaluation $  OT Eval Moderate Complexity: [place], OT Acute Rehabilitation Services Pager [phone] Office [phone]   Delight Stare 12/29/2020, 11:13 AM

## 2020-12-29 NOTE — Progress Notes (Signed)
Family Medicine Teaching Service Daily Progress Note Intern Pager: [phone]  Patient name: Christopher Fernandez Medical record number: [id number] Date of birth: 11-26-1970 Age: 50 y.o. Gender: male  Primary Care Provider: Danna Hefty, DO Consultants: ID, neurosurgery Code Status: Full  Pt Overview and Major Events to Date:  4/15: Admitted   Assessment and Plan: Christopher Fernandez a 50 y.o.malepresenting with worsening osteomyelitis and concern of new abscess/phlegmon in thoracic spine after having a repeat MRI a few days ago. PMH is significant forhistory of thoracic osteomyelitis, type 2 diabetes, peripheral neuropathy, chronic pancreatitis/pancreatic insufficiency, hyperlipidemia, hypertension, asthma, seasonal allergies, GERD.  Osteomyelitis/discitiswithnew abscess/phlegmoninthoracic spine Patient with previous MRI evidence of of discitis and osteomyelitis at T9-T10 without epidural abscess at that time. Patient was seen by infectious disease yesterday and complained of worsening pain. Patient had MRI on 12/25/2020 that showed worsening progression of osteomyelitis to continuous vertebra at T8 and T11 with circumferential epidural abscess/phlegmon at T9-T10with moderate spinal stenosis at that level.  -Neurosurgery following, appreciate continued involvement and recommendations: plan for surgical decompression  -continue heparin subq, follow neurosurg recs -ID following, appreciate recs including continue daptomycin -IV daptomycin  -Close neurological monitoring to ensure he is not developing neurological signs or symptoms  -PT/OT evaluate and treat -Pending blood cultures -Urine culture notable for diphtheroids   -Increase Toradol to 30 mg every 6 hours -Oxycodone 5-10 mg as needed for breakthrough -Lidocaine patch -Continue home baclofen  Osteomyelitis right hip Patient with MRI evidence from 12/11/2010 worsening osteomyelitis of theparasymphyseal right superior pubic  ramus with improvement in right abductor minimus myositis. -ID recommendations as above -IV daptomycin  Moderatethoracicspinal stenosis: Most recent MRI the patient was having worsening spinal stenosis at T9-T10 in conjunction with his epidural abscess/phlegmon. This was classified as moderate on the MRI scan.  -Neurosurgery consult, plan for decompression on 4/19  T2DM with neuropathy CBG 176 this morning. Home medications include Januvia 100 mg daily, metformin 1000 mg twice daily, sliding scale insulin, 18 units insulin glargine in the mornings, Jardiance 25 mg daily. Also on gabapentin for neuropathy.Most recent A1c 9.1. -Initially reduce patient's Lantus by 50% while he was n.p.o., however blood glucose levels have remained appropriate so we will continue this at this time -Continue home metformin -Hold Jardiance and Januvia -sSSI  Chronic pancreatitis/pancreatic insufficiency Home medications include pancrelipase3 times per day with meals. -We will continue with pancreatic enzymes from our formulary  History of anemia Hgb 10.2. Per chart review, baseline Hgb 10-12.  -monitor CBC   Hyperlipidemia Home medications include Lipitor 40. Last cholesterol panel showsLDL of 34, HDL 34. -Continue home Lipitor  Asthma and history of seasonal allergies Home medications include albuterol as needed, Advair daily, Singulair, Zyrtec. -Continue home medications -Dulera per formulary   History of alcohol abuse/tobacco use disorder Patient states his last drink was about 1.5 years ago. Continues to smoke about half pack per day, previously smoked about a pack per day for 35 years. -nicotine patch as desired   Anxiety  Depression Home medications include duloxetine 60 mg daily, Abilify 5 mg daily. -Continue home medications  GERD Home medications include pantoprazole 40 mg twice daily -Continue home medications  History of tremors Home medications include  propranolol -Continue home propranolol  Left foot recent toe amputation Occurred on 3/23. Patient without complaints of pain in that region today. 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 -Continue to monitor  Unintentional weight loss: Weight loss of about 30-35 pounds per patient on admission. -monitor  weight -encourage diet -nutrition consult placed, appreciate recs  FEN/GI: carb modified  PPx: subq heparin    Status is: Inpatient  Remains inpatient appropriate because:Ongoing diagnostic testing needed not appropriate for outpatient work up   Dispo:  Patient From: Home  Planned Disposition: Home  Medically stable for discharge: No          Subjective:  No acute overnight events reported. Patient complaining of left-sided back pain, explained that this is most likely due to the hospital bed. Other pain remains stable.   Objective: Temp:  [98 F (36.7 C)-98.6 F (37 C)] 98.6 F (37 C) (04/17 0610) Pulse Rate:  [79-85] 80 (04/17 0610) Resp:  [14-16] 16 (04/17 0610) BP: (104-124)/(78-86) 124/78 (04/17 0610) SpO2:  [96 %-100 %] 100 % (04/17 0610) Physical Exam: General: Patient sitting upright in bed, in no acute distress.  Cardiovascular: RRR, no murmurs or gallops  Respiratory: CTAB no wheezing, rales or rhonchi Abdomen: soft, nontender, BS+ MSK: spinal tenderness along thoracic region without warm or erythema  Extremities: radial and distal pulses present, no LE edema noted bilaterally  Psych: mood appropriate   Laboratory: Recent Labs  Lab 12/27/20 0821 12/28/20 0506  WBC 8.4 7.0  HGB 10.9* 10.2*  HCT 34.1* 31.2*  PLT 502* 461*   Recent Labs  Lab 12/27/20 0644 12/27/20 1111 12/28/20 0506  NA 135  --  133*  K 4.5  --  4.2  CL 103  --  101  CO2 23  --  25  BUN 11  --  15  CREATININE 0.75 0.79 0.77  CALCIUM 9.1  --  8.6*  PROT 8.4*  --   --   BILITOT 0.6  --   --   ALKPHOS 134*  --   --   ALT 9  --   --   AST  10*  --   --   GLUCOSE 162*  --  143*     Imaging/Diagnostic Tests: No results found.  Donney Dice, DO 12/29/2020, 7:01 AM PGY-1, [facility] Intern pager: [phone], text pages welcome

## 2020-12-29 NOTE — Evaluation (Addendum)
Physical Therapy Evaluation Patient Details Name: Christopher Fernandez MRN: [id number] DOB: 1971-04-26 Today's Date: 12/29/2020   History of Present Illness  Christopher Fernandez is a 50 y.o. male presenting with worsening osteomyelitis and concern of new abscess/phlegmon in thoracic spine after having a repeat MRI a few days ago. Plan for spinal surgery; also with MRI evidence from 12/11/2010 worsening osteomyelitis of theparasymphyseal right superior pubic ramus with improvement in right abductor minimus myositis.PMH is significant for history of thoracic osteomyelitis, type 2 diabetes, peripheral neuropathy, chronic pancreatitis/pancreatic insufficiency, hyperlipidemia, hypertension, asthma, seasonal allergies, GERD.  Clinical Impression   Pt admitted with above diagnosis. 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 Pt will benefit from skilled PT to increase their independence and safety with mobility to allow discharge to the venue listed below.    Anticipate he will be able to dc home from this acute stay post surgery and recovery; Will update as needed postop     Follow Up Recommendations Other (comment) (Will consider HHPT follow up versus no PT follow up versus Outpt PT follow up; will depend on pt's progress postop)    Equipment Recommendations  Rolling walker with 5" wheels;Other (comment) (he may already have a RW)     Recommendations for Other Services OT consult (as ordered)     Precautions / Restrictions Precautions Precautions: Back Precaution Booklet Issued: No Precaution Comments: log roll in/out of bed; Back prec for comfort Other Brace: Has a darco shoe from previous admission (approx 3 wks ago); Darco shoe is not in room, and pt was noncomital re: if MD officially cleared him to walk without it; Has been consistently walking without it so far this admission Restrictions LLE Weight Bearing: Weight bearing as tolerated      Mobility  Bed Mobility Overal bed mobility: Needs Assistance Bed Mobility: Rolling;Sidelying to Sit;Sit to Sidelying Rolling: Supervision Sidelying to sit: Supervision       General bed mobility comments: Increased time; Supervision and cues for log rolling technique    Transfers Overall transfer level: Needs assistance Equipment used: Quad cane Transfers: Sit to/from Stand Sit to Stand: Min guard         General transfer comment: Noteworthy dependence on UEs to push up and steady  Ambulation/Gait Ambulation/Gait assistance: Min guard Gait Distance (Feet): 60 Feet Assistive device: Rolling walker (2 wheeled) (height adjusted for better fit) Gait Pattern/deviations: Step-through pattern;Decreased step length - right;Decreased step length - left     General Gait Details: Cues to push down through RW for bil UE support, and to unload spine for more comfort with walking  Stairs            Wheelchair Mobility    Modified Rankin (Stroke Patients Only)       Balance Overall balance assessment: Mild deficits observed, not formally tested  Pertinent Vitals/Pain Pain Assessment: 0-10 Pain Score: 6  Pain Location: low back/bil flank/hips; initially 4/10 pain reached a 6/10 by the end of session Pain Descriptors / Indicators: Constant Pain Intervention(s): Monitored during  session;Repositioned    Home Living Family/patient expects to be discharged to:: Private residence Living Arrangements: Spouse/significant other Available Help at Discharge:  (works days) Type of Home: House Home Access: Stairs to enter Entrance Stairs-Rails: None Technical brewer of Steps: 1 Home Layout: One level Home Equipment: Radio producer - quad      Prior Function Level of Independence: Independent with assistive device(s)         Comments: has been using quad cane for the past few weeks; able to continue to complete ADL from sit/stand level     Hand Dominance        Extremity/Trunk Assessment   Upper Extremity Assessment Upper Extremity Assessment: Defer to OT evaluation    Lower Extremity Assessment Lower Extremity Assessment: Generalized weakness (Benefits from Bil UE support)       Communication   Communication: No difficulties  Cognition Arousal/Alertness: Awake/alert Behavior During Therapy: WFL for tasks assessed/performed Overall Cognitive Status: Within Functional Limits for tasks assessed                                        General Comments General comments (skin integrity, edema, etc.): VSS; Able to find a position of relative comfort in recliner    Exercises     Assessment/Plan    PT Assessment Patient needs continued PT services  PT Problem List Decreased strength;Decreased activity tolerance;Decreased balance;Pain       PT Treatment Interventions Patient/family education;DME instruction;Gait training;Stair training;Functional mobility training;Therapeutic activities;Therapeutic exercise;Balance training    PT Goals (Current goals can be found in the Care Plan section)  Acute Rehab PT Goals Patient Stated Goal: Pt tells me he is looking forward to surgery PT Goal Formulation: With patient Time For Goal Achievement: 01/19/21 Potential to Achieve Goals: Good    Frequency Min 3X/week (High likelihood we will incr  frequency of acute PT to min 5x/wk after surgery)   Barriers to discharge Decreased caregiver support Must be modified independent to be able to dc home    Co-evaluation               AM-PAC PT "6 Clicks" Mobility  Outcome Measure Help needed turning from your back to your side while in a flat bed without using bedrails?: None Help needed moving from lying on your back to sitting on the side of a flat bed without using bedrails?: A Little Help needed moving to and from a bed to a chair (including a wheelchair)?: None Help needed standing up from a chair using your arms (e.g., wheelchair or bedside chair)?: None Help needed to walk in hospital room?: A Little Help needed climbing 3-5 steps with a railing? : A Little 6 Click Score: 21    End of Session   Activity Tolerance: Patient tolerated treatment well Patient left: in chair;with call bell/phone within reach Nurse Communication: Mobility status PT Visit Diagnosis: Unsteadiness on feet (R26.81);Muscle weakness (generalized) (M62.81)    Time: 4098-1191 PT Time Calculation (min) (ACUTE ONLY): 11 min   Charges:   PT Evaluation $PT Eval Low Complexity: [place], PT  Acute Rehabilitation Services Pager [phone] Office [phone]'   Kelly  Burman Foster 12/29/2020, 10:33 AM

## 2020-12-29 NOTE — Plan of Care (Signed)

## 2020-12-30 DIAGNOSIS — G061 Intraspinal abscess and granuloma: Secondary | ICD-10-CM

## 2020-12-30 DIAGNOSIS — M4804 Spinal stenosis, thoracic region: Secondary | ICD-10-CM | POA: Diagnosis not present

## 2020-12-30 DIAGNOSIS — M869 Osteomyelitis, unspecified: Secondary | ICD-10-CM | POA: Diagnosis not present

## 2020-12-30 DIAGNOSIS — Q8901 Asplenia (congenital): Secondary | ICD-10-CM | POA: Diagnosis not present

## 2020-12-30 DIAGNOSIS — R7881 Bacteremia: Secondary | ICD-10-CM | POA: Diagnosis not present

## 2020-12-30 DIAGNOSIS — M8618 Other acute osteomyelitis, other site: Secondary | ICD-10-CM | POA: Diagnosis not present

## 2020-12-30 DIAGNOSIS — B9562 Methicillin resistant Staphylococcus aureus infection as the cause of diseases classified elsewhere: Secondary | ICD-10-CM | POA: Diagnosis not present

## 2020-12-30 LAB — CBC
HCT: 28.8 % — ABNORMAL LOW (ref 39.0–52.0)
Hemoglobin: 9.2 g/dL — ABNORMAL LOW (ref 13.0–17.0)
MCH: 27.8 pg (ref 26.0–34.0)
MCHC: 31.9 g/dL (ref 30.0–36.0)
MCV: 87 fL (ref 80.0–100.0)
Platelets: 435 10*3/uL — ABNORMAL HIGH (ref 150–400)
RBC: 3.31 MIL/uL — ABNORMAL LOW (ref 4.22–5.81)
RDW: 14.3 % (ref 11.5–15.5)
WBC: 8.9 10*3/uL (ref 4.0–10.5)
nRBC: 0 % (ref 0.0–0.2)

## 2020-12-30 LAB — BASIC METABOLIC PANEL
Anion gap: 7 (ref 5–15)
BUN: 16 mg/dL (ref 6–20)
CO2: 27 mmol/L (ref 22–32)
Calcium: 8.4 mg/dL — ABNORMAL LOW (ref 8.9–10.3)
Chloride: 101 mmol/L (ref 98–111)
Creatinine, Ser: 0.77 mg/dL (ref 0.61–1.24)
GFR, Estimated: >60 mL/min (ref >60)
Glucose, Bld: 177 mg/dL — ABNORMAL HIGH (ref 70–99)
Potassium: 4.2 mmol/L (ref 3.5–5.1)
Sodium: 135 mmol/L (ref 135–145)

## 2020-12-30 LAB — GLUCOSE, CAPILLARY
Glucose-Capillary: 138 mg/dL — ABNORMAL HIGH (ref 70–99)
Glucose-Capillary: 153 mg/dL — ABNORMAL HIGH (ref 70–99)
Glucose-Capillary: 118 mg/dL — ABNORMAL HIGH (ref 70–99)
Glucose-Capillary: 160 mg/dL — ABNORMAL HIGH (ref 70–99)

## 2020-12-30 MED ORDER — HYDROMORPHONE HCL 1 MG/ML IJ SOLN
1.0000 mg | INTRAMUSCULAR | Status: AC | PRN
  Administered 2020-12-30 (×2): 1 mg via INTRAVENOUS
  Filled 2020-12-30 (×2): qty 1

## 2020-12-30 MED ORDER — ADULT MULTIVITAMIN W/MINERALS CH
1.0000 | ORAL_TABLET | Freq: Every day | ORAL | Status: DC
  Administered 2020-12-30 – 2021-01-17 (×17): 1 via ORAL
  Filled 2020-12-30 (×18): qty 1

## 2020-12-30 MED ORDER — PROSOURCE PLUS PO LIQD
30.0000 mL | Freq: Three times a day (TID) | ORAL | Status: DC
  Administered 2020-12-30 – 2021-01-17 (×44): 30 mL via ORAL
  Filled 2020-12-30 (×48): qty 30

## 2020-12-30 MED ORDER — SODIUM CHLORIDE 0.9 % IV SOLN: INTRAVENOUS | Status: DC

## 2020-12-30 MED ORDER — KETOROLAC TROMETHAMINE 15 MG/ML IJ SOLN
30.0000 mg | Freq: Four times a day (QID) | INTRAMUSCULAR | Status: AC
  Administered 2020-12-30 – 2021-01-01 (×8): 30 mg via INTRAVENOUS
  Filled 2020-12-30 (×9): qty 2

## 2020-12-30 NOTE — Progress Notes (Signed)
Initial Nutrition Assessment  DOCUMENTATION CODES:   Not applicable  INTERVENTION:   -30 ml Prosource Plus TID, each supplement provides 100 kcals and 15 grams protein -MVI with minerals daily  NUTRITION DIAGNOSIS:   Increased nutrient needs related to post-op healing as evidenced by estimated needs.  GOAL:   Patient will meet greater than or equal to 90% of their needs  MONITOR:   PO intake,Supplement acceptance,Labs,Weight trends,Skin,I & O's  REASON FOR ASSESSMENT:   Consult Assessment of nutrition requirement/status  ASSESSMENT:   Christopher Fernandez is a 50 y.o. male presenting with worsening osteomyelitis and concern of new abscess/phlegmon in thoracic spine after having a repeat MRI a few days ago. PMH is significant for history of thoracic osteomyelitis, type 2 diabetes, peripheral neuropathy, chronic pancreatitis/pancreatic insufficiency, hyperlipidemia, hypertension, asthma, seasonal allergies, GERD.  Pt admitted with osteomyelitis/ discitis with new abscess/ phlegmon in thoracic spine.   Reviewed I/O's: -570 ml x 24 hours and +286 ml since admission  UOP: 650 ml x 24 hours  Per general surgery notes, plan for surgical decompression via thoracotomy for resection of infected vertebrae with placement of interbody graft and lateral plate reconstruction.   Spoke with pt at bedside, who complains of being uncomfortable due to back pain. He endorses a general decline in health over the past 3 months due to back pain.He shares that he is unable to stay in any position for an extended period of time and his mobility has significantly decreased; he is able to use a cane to go short distances.   Pt reports decreased appetite, however, has been forcing himself to eat to help promote healing and preserve lean body mass. He typically consumes 3 meals per day (Breakfast: eggs, toast, and sausage or cereal; Lunch: sandwich; Dinner: meat, starch, and vegetable). Pt and his wife share  cooking responsibilities. Per pt, his appetite has improved here- noted he consumed 100% of his breakfast tray.  Per pt, he UBW is around 260# and he estimates he has lost about 30 pounds over the past 3 months. Reviewed wt hx; pt has experienced a 5.7% wt loss over the past month, which is significant for time frame.   Discussed with pt importance of good meal and supplement intake. He has never tried supplements before, but is hesitant to try milky supplements secondary to suspected lactose intolerance. He is amendable to Prosource.   Medications reviewed and include colace, creon, and remeron.  Lab Results  Component Value Date   HGBA1C 9.1 (H) 12/27/2020   PTA DM medications are 100 mg sitagliptin daily, 1000 mg metformin daily, 18 units insulin glargine daily, and 6-10 units insulin lispro TID.   Labs reviewed: CBGS: 138-200 (inpatient orders for glycemic control are 0-9 units insulin aspart TID with meals and 9 units insulin glargine daily).   NUTRITION - FOCUSED PHYSICAL EXAM:  Flowsheet Row Most Recent Value  Orbital Region No depletion  Upper Arm Region No depletion  Thoracic and Lumbar Region No depletion  Buccal Region No depletion  Temple Region Mild depletion  Clavicle Bone Region No depletion  Clavicle and Acromion Bone Region No depletion  Scapular Bone Region No depletion  Dorsal Hand No depletion  Patellar Region Mild depletion  Anterior Thigh Region Mild depletion  Posterior Calf Region Mild depletion  Edema (RD Assessment) None  Hair Reviewed  Eyes Reviewed  Mouth Reviewed  Skin Reviewed  Nails Reviewed       Diet Order:   Diet Order  Diet NPO time specified  Diet effective midnight           Diet Carb Modified Fluid consistency: Thin; Room service appropriate? Yes  Diet effective now                 EDUCATION NEEDS:   Education needs have been addressed  Skin:  Skin Assessment: Reviewed RN Assessment  Last BM:  12/27/20  Height:    Ht Readings from Last 1 Encounters:  12/27/20 6\' 3"  (1.905 m)    Weight:   Wt Readings from Last 1 Encounters:  12/30/20 106.5 kg    Ideal Body Weight:  89.1 kg  BMI:  Body mass index is 29.35 kg/m.  Estimated Nutritional Needs:   Kcal:  0638-6854  Protein:  130-155 grams  Fluid:  > 2 L    Loistine Chance, RD, LDN, [place] Registered Dietitian II Certified Diabetes Care and Education Specialist Please refer to [facility] for RD and/or RD on-call/weekend/after hours pager

## 2020-12-30 NOTE — Progress Notes (Signed)
Subjective: No new complaints   Antibiotics:  Anti-infectives (From admission, onward)   Start     Dose/Rate Route Frequency Ordered Stop   12/27/20 2000  DAPTOmycin (CUBICIN) 1,000 mg in sodium chloride 0.9 % IVPB        1,000 mg 140 mL/hr over 30 Minutes Intravenous Daily 12/27/20 1143        Medications: Scheduled Meds: . ARIPiprazole  5 mg Oral QPM  . atorvastatin  40 mg Oral Daily  . Chlorhexidine Gluconate Cloth  6 each Topical Daily  . docusate sodium  100 mg Oral Daily  . DULoxetine  60 mg Oral Daily  . fluticasone furoate-vilanterol  1 puff Inhalation Daily  . gabapentin  600 mg Oral TID  . heparin  5,000 Units Subcutaneous Q8H  . insulin aspart  0-9 Units Subcutaneous TID WC  . insulin glargine  9 Units Subcutaneous Daily  . lidocaine  1 patch Transdermal Q24H  . lipase/protease/amylase  36,000 Units Oral TID WC  . loratadine  10 mg Oral Daily  . metFORMIN  1,000 mg Oral BID WC  . mirtazapine  30 mg Oral QHS  . montelukast  10 mg Oral QHS  . pantoprazole  40 mg Oral BID  . propranolol ER  80 mg Oral Daily   Continuous Infusions: . sodium chloride 146 mL/hr at 12/27/20 1607  . DAPTOmycin (CUBICIN)  IV 1,000 mg (12/29/20 2056)   PRN Meds:.acetaminophen **OR** acetaminophen, albuterol, baclofen, bismuth subsalicylate, oxyCODONE, sodium chloride flush    Objective: Weight change:   Intake/Output Summary (Last 24 hours) at 12/30/2020 1229 Last data filed at 12/30/2020 0500 Gross per 24 hour  Intake 80 ml  Output 650 ml  Net -570 ml   Blood pressure (!) 132/93, pulse 73, temperature 97.9 F (36.6 C), temperature source Oral, resp. rate 16, height 6\' 3"  (1.905 m), weight 106.5 kg, SpO2 98 %. Temp:  [97.9 F (36.6 C)-98.6 F (37 C)] 97.9 F (36.6 C) (04/18 0739) Pulse Rate:  [73-89] 73 (04/18 0739) Resp:  [16-20] 16 (04/18 0739) BP: (132-135)/(85-96) 132/93 (04/18 0739) SpO2:  [97 %-98 %] 98 % (04/18 0739) Weight:  [106.5 kg] 106.5 kg (04/18  1046)  Physical Exam: Physical Exam Constitutional:      Appearance: He is well-developed.  HENT:     Head: Normocephalic and atraumatic.  Eyes:     Conjunctiva/sclera: Conjunctivae normal.  Cardiovascular:     Rate and Rhythm: Normal rate and regular rhythm.     Heart sounds: No murmur heard. No gallop.   Pulmonary:     Effort: Pulmonary effort is normal. No respiratory distress.     Breath sounds: Normal breath sounds. No stridor. No wheezing or rhonchi.  Abdominal:     General: Abdomen is flat. There is no distension.     Palpations: Abdomen is soft.  Musculoskeletal:        General: Normal range of motion.     Cervical back: Normal range of motion and neck supple.  Skin:    General: Skin is warm and dry.     Findings: No erythema or rash.  Neurological:     General: No focal deficit present.     Mental Status: He is alert and oriented to person, place, and time.  Psychiatric:        Mood and Affect: Mood normal.        Behavior: Behavior normal.        Thought Content: Thought  content normal.        Judgment: Judgment normal.      CBC:    BMET Recent Labs    12/28/20 0506 12/30/20 0342  NA 133* 135  K 4.2 4.2  CL 101 101  CO2 25 27  GLUCOSE 143* 177*  BUN 15 16  CREATININE 0.77 0.77  CALCIUM 8.6* 8.4*     Liver Panel  No results for input(s): PROT, ALBUMIN, AST, ALT, ALKPHOS, BILITOT, BILIDIR, IBILI in the last 72 hours.     Sedimentation Rate No results for input(s): ESRSEDRATE in the last 72 hours. C-Reactive Protein No results for input(s): CRP in the last 72 hours.  Micro Results: Recent Results (from the past 720 hour(s))  Culture, blood (routine x 2)     Status: None   Collection Time: 12/01/20 10:16 AM   Specimen: BLOOD LEFT HAND  Result Value Ref Range Status   Specimen Description BLOOD LEFT HAND  Final   Special Requests   Final    BOTTLES DRAWN AEROBIC AND ANAEROBIC Blood Culture results may not be optimal due to an  inadequate volume of blood received in culture bottles   Culture   Final    NO GROWTH 5 DAYS Performed at [facility], [place] [address], [place], [place] [address]    Report Status 12/06/2020 FINAL  Final  Culture, blood (routine x 2)     Status: Abnormal   Collection Time: 12/01/20 10:23 AM   Specimen: BLOOD RIGHT HAND  Result Value Ref Range Status   Specimen Description BLOOD RIGHT HAND  Final   Special Requests   Final    BOTTLES DRAWN AEROBIC AND ANAEROBIC Blood Culture adequate volume   Culture  Setup Time   Final    ANAEROBIC BOTTLE ONLY GRAM POSITIVE COCCI CRITICAL VALUE NOTED.  VALUE IS CONSISTENT WITH PREVIOUSLY REPORTED AND CALLED VALUE.    Culture (A)  Final    STAPHYLOCOCCUS AUREUS SUSCEPTIBILITIES PERFORMED ON PREVIOUS CULTURE WITHIN THE LAST 5 DAYS. Performed at [facility], [place] [address], [place], [place] [address]    Report Status 12/05/2020 FINAL  Final  Surgical pcr screen     Status: Abnormal   Collection Time: 12/01/20 10:15 PM   Specimen: Nasal Mucosa; Nasal Swab  Result Value Ref Range Status   MRSA, PCR POSITIVE (A) NEGATIVE Final    Comment: RESULT CALLED TO, READ BACK BY AND VERIFIED WITH: ROGERS,P RN 0001 12/02/2020 MITCHELL,L    Staphylococcus aureus POSITIVE (A) NEGATIVE Final    Comment: (NOTE) The Xpert SA Assay (FDA approved for NASAL specimens in patients 59 years of age and older), is one component of a comprehensive surveillance program. It is not intended to diagnose infection nor to guide or monitor treatment. Performed at [facility], [place] [address] [place], [place] [address]   Resp Panel by RT-PCR (Flu A&B, Covid) Nasopharyngeal Swab     Status: None   Collection Time: 12/02/20  8:15 AM   Specimen: Nasopharyngeal Swab; Nasopharyngeal(NP) swabs in vial transport medium  Result Value Ref Range Status   SARS Coronavirus 2 by RT PCR NEGATIVE NEGATIVE Final    Comment: (NOTE) SARS-CoV-2 target nucleic acids are NOT  DETECTED.  The SARS-CoV-2 RNA is generally detectable in upper respiratory specimens during the acute phase of infection. The lowest concentration of SARS-CoV-2 viral copies this assay can detect is 138 copies/mL. A negative result does not preclude SARS-Cov-2 infection and should not be used as  the sole basis for treatment or other patient management decisions. A negative result may occur with  improper specimen collection/handling, submission of specimen other than nasopharyngeal swab, presence of viral mutation(s) within the areas targeted by this assay, and inadequate number of viral copies(<138 copies/mL). A negative result must be combined with clinical observations, patient history, and epidemiological information. The expected result is Negative.  Fact Sheet for Patients:  EntrepreneurPulse.com.au  Fact Sheet for Healthcare Providers:  IncredibleEmployment.be  This test is no t yet approved or cleared by the [place] FDA and  has been authorized for detection and/or diagnosis of SARS-CoV-2 by FDA under an Emergency Use Authorization (EUA). This EUA will remain  in effect (meaning this test can be used) for the duration of the COVID-19 declaration under Section 564(b)(1) of the Act, 21 U.S.C.section 360bbb-3(b)(1), unless the authorization is terminated  or revoked sooner.       Influenza A by PCR NEGATIVE NEGATIVE Final   Influenza B by PCR NEGATIVE NEGATIVE Final    Comment: (NOTE) The Xpert Xpress SARS-CoV-2/FLU/RSV plus assay is intended as an aid in the diagnosis of influenza from Nasopharyngeal swab specimens and should not be used as a sole basis for treatment. Nasal washings and aspirates are unacceptable for Xpert Xpress SARS-CoV-2/FLU/RSV testing.  Fact Sheet for Patients: EntrepreneurPulse.com.au  Fact Sheet for Healthcare Providers: IncredibleEmployment.be  This test is not yet  approved or cleared by the [place] FDA and has been authorized for detection and/or diagnosis of SARS-CoV-2 by FDA under an Emergency Use Authorization (EUA). This EUA will remain in effect (meaning this test can be used) for the duration of the COVID-19 declaration under Section 564(b)(1) of the Act, 21 U.S.C. section 360bbb-3(b)(1), unless the authorization is terminated or revoked.  Performed at [facility], [place] [address], [place], [place] [address]   Aerobic/Anaerobic Culture w Gram Stain (surgical/deep wound)     Status: None   Collection Time: 12/02/20  4:34 PM   Specimen: PATH Digit amputation; Tissue  Result Value Ref Range Status   Specimen Description TISSUE LEFT TOE  Final   Special Requests PATIENT ON FOLLOWING CEFEPIME MAXIPIME VANC  Final   Gram Stain   Final    RARE WBC PRESENT, PREDOMINANTLY PMN NO ORGANISMS SEEN    Culture   Final    No growth aerobically or anaerobically. Performed at [facility], [place] [address], [place], [place] [address]    Report Status 12/07/2020 FINAL  Final  Culture, blood (routine x 2)     Status: None   Collection Time: 12/04/20 12:14 PM   Specimen: BLOOD  Result Value Ref Range Status   Specimen Description BLOOD LEFT ANTECUBITAL  Final   Special Requests   Final    BOTTLES DRAWN AEROBIC AND ANAEROBIC Blood Culture adequate volume   Culture   Final    NO GROWTH 5 DAYS Performed at [facility], [place] [address] [place], [place] [address]    Report Status 12/09/2020 FINAL  Final  Culture, blood (routine x 2)     Status: None   Collection Time: 12/04/20 12:14 PM   Specimen: BLOOD LEFT FOREARM  Result Value Ref Range Status   Specimen Description BLOOD LEFT FOREARM  Final   Special Requests   Final    BOTTLES DRAWN AEROBIC AND ANAEROBIC Blood Culture adequate volume   Culture   Final    NO GROWTH 5 DAYS Performed at [facility], [place]  [address], [place], [place] [address]    Report Status  12/09/2020 FINAL  Final  SARS CORONAVIRUS 2 (TAT 6-24 HRS) Nasopharyngeal Nasopharyngeal Swab     Status: None   Collection Time: 12/27/20  6:16 AM   Specimen: Nasopharyngeal Swab  Result Value Ref Range Status   SARS Coronavirus 2 NEGATIVE NEGATIVE Final    Comment: (NOTE) SARS-CoV-2 target nucleic acids are NOT DETECTED.  The SARS-CoV-2 RNA is generally detectable in upper and lower respiratory specimens during the acute phase of infection. Negative results do not preclude SARS-CoV-2 infection, do not rule out co-infections with other pathogens, and should not be used as the sole basis for treatment or other patient management decisions. Negative results must be combined with clinical observations, patient history, and epidemiological information. The expected result is Negative.  Fact Sheet for Patients: SugarRoll.be  Fact Sheet for Healthcare Providers: https://www.woods-mathews.com/  This test is not yet approved or cleared by the [place] FDA and  has been authorized for detection and/or diagnosis of SARS-CoV-2 by FDA under an Emergency Use Authorization (EUA). This EUA will remain  in effect (meaning this test can be used) for the duration of the COVID-19 declaration under Se ction 564(b)(1) of the Act, 21 U.S.C. section 360bbb-3(b)(1), unless the authorization is terminated or revoked sooner.  Performed at [facility], [place] [address], [place], [place] [address]   Blood culture (routine single)     Status: None (Preliminary result)   Collection Time: 12/27/20  6:29 AM   Specimen: BLOOD LEFT HAND  Result Value Ref Range Status   Specimen Description BLOOD LEFT HAND  Final   Special Requests   Final    BOTTLES DRAWN AEROBIC AND ANAEROBIC Blood Culture results may not be optimal due to an inadequate volume of blood received in culture bottles   Culture   Final    NO GROWTH 3 DAYS Performed at [facility],  [place] [address] [place], [place] [address]    Report Status PENDING  Incomplete  Urine culture     Status: Abnormal   Collection Time: 12/27/20  9:26 AM   Specimen: In/Out Cath Urine  Result Value Ref Range Status   Specimen Description IN/OUT CATH URINE  Final   Special Requests NONE  Final   Culture (A)  Final    1,000 COLONIES/mL DIPHTHEROIDS(CORYNEBACTERIUM SPECIES) Standardized susceptibility testing for this organism is not available. Performed at [facility], [place] [address], [place],  [address]    Report Status 12/28/2020 FINAL  Final    Studies/Results: No results found.    Assessment/Plan:  INTERVAL HISTORY: Patient to go to the operating room tomorrow   Active Problems:   Osteomyelitis ([place])   Spinal epidural abscess    Christopher Fernandez is a 50 y.o. 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  Would get deep cultures from the operating room  Continue IV daptomycin  We will reset the clock on his antibiotics and  give him 8 weeks of postoperative systemic antibiotics  I spent greater than 35 minutes with the patient including greater than 50% of time in face to face counsel of the patient guarding his severe MRSA infection and its involvement in multiple sites and in coordination of his care.  LOS: 2 days   Alcide Evener 12/30/2020, 12:29 PM

## 2020-12-30 NOTE — Progress Notes (Signed)
Family Medicine Teaching Service Daily Progress Note Intern Pager: [phone]  Patient name: Christopher Fernandez Medical record number: [id number] Date of birth: 18-Jun-1971 Age: 50 y.o. Gender: male  Primary Care Provider: Danna Hefty, DO Consultants: ID, neurosurgery  Code Status: Full   Pt Overview and Major Events to Date:  4/15: Admitted   Assessment and Plan: LINKEN MCGLOTHEN a 50 y.o.malepresenting with worsening osteomyelitis and concern of new abscess/phlegmon in thoracic spine after having a repeat MRI a few days ago. PMH is significant forhistory of thoracic osteomyelitis, type 2 diabetes, peripheral neuropathy, chronic pancreatitis/pancreatic insufficiency, hyperlipidemia, hypertension, asthma, seasonal allergies, GERD.  Osteomyelitis/discitiswithnew abscess/phlegmoninthoracic spine Endorsing mild pain that he attributes to over exerting himself when working with physical therapy. Patient with previous MRI evidence of of discitis and osteomyelitis at T9-T10 without epidural abscess at that time. Patient was seen by infectious disease yesterday and complained of worsening pain. Patient had MRI on 12/25/2020 that showed worsening progression of osteomyelitis to continuous vertebra at T8 and T11 with circumferential epidural abscess/phlegmon at T9-T10with moderate spinal stenosis at that level.Urine culture notable for 1000 colonies of diphtheroids. Blood cultures notable for no growth for 3 days.  -Neurosurgery following, appreciate continued involvement and recommendations: plan for surgical decompression  -continue heparin subq, follow neurosurg recs -ID following, appreciate recs including continue daptomycin -IV daptomycin  -Close neurological monitoring to ensure he is not developing neurological signs or symptoms  -Toradol to 30 mg every 6 hours -Oxycodone 5-10 mg as needed for breakthrough -Lidocaine patch -Continue home baclofen -PT/OT evaluate and treat:  recommended rolling walker with 5" wheels and 3 in 1 bedside commod  Osteomyelitis right hip Patient with MRI evidence from 12/11/2010 worsening osteomyelitis of theparasymphyseal right superior pubic ramus with improvement in right abductor minimus myositis. -ID recommendations as above -IV daptomycin  Moderatethoracicspinal stenosis: Most recent MRI the patient was having worsening spinal stenosis at T9-T10 in conjunction with his epidural abscess/phlegmon. This was classified as moderate on the MRI scan.  -Neurosurgery consult, plan for decompression on 4/19  T2DM with neuropathy CBG 138 this morning. Home medications include Januvia 100 mg daily, metformin 1000 mg twice daily, sliding scale insulin, 18 units insulin glargine in the mornings, Jardiance 25 mg daily. Also on gabapentin for neuropathy.Most recent A1c 9.1. -Initially reduce patient's Lantus by 50% while he was n.p.o., however blood glucose levels have remained appropriate so we will continue this at this time -Continue home metformin -Hold Jardiance and Januvia -sSSI  Chronic pancreatitis/pancreatic insufficiency Home medications include pancrelipase3 times per day with meals. -We will continue with pancreatic enzymes from our formulary  History of anemia Hgb 9.2. Per chart review, baseline Hgb 10-12.  -monitor CBC   Hyperlipidemia Home medications include Lipitor 40. Last cholesterol panel showsLDL of 34, HDL 34. -Continue home Lipitor  Asthma and history of seasonal allergies Home medications include albuterol as needed, Advair daily, Singulair, Zyrtec. -Continue home medications -Dulera per formulary   History of alcohol abuse/tobacco use disorder Patient states his last drink was about 1.5 years ago. Continues to smoke about half pack per day, previously smoked about a pack per day for 35 years. -nicotine patch as desired   Anxiety  Depression Home medications include duloxetine 60 mg  daily, Abilify 5 mg daily. -Continue home medications  GERD Home medications include pantoprazole 40 mg twice daily -Continue home medications  History of tremors Home medications include propranolol -Continue home propranolol  Left foot recent toe amputation Occurred on 3/23. Patient without  complaints of pain in that region today. 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 -Continue to monitor  Unintentional weight loss: Weight loss of about 30-35 pounds per patient on admission. Weight stable thus far.  -daily weights -encourage diet -nutrition consult placed, appreciate recs  FEN/GI: carb modified  PPx: subq heparin    Status is: Inpatient  Remains inpatient appropriate because:Inpatient level of care appropriate due to severity of illness   Dispo:  Patient From: Home  Planned Disposition: Home  Medically stable for discharge: No          Subjective:  No significant overnight events reported. Patient endorsing pain that is well-controlled at this time. Denies any other concerns.   Objective: Temp:  [97.9 F (36.6 C)-98.6 F (37 C)] 97.9 F (36.6 C) (04/18 0739) Pulse Rate:  [73-89] 73 (04/18 0739) Resp:  [16-20] 16 (04/18 0739) BP: (122-135)/(85-96) 132/93 (04/18 0739) SpO2:  [97 %-98 %] 98 % (04/18 0739) Physical Exam: General: Patient sitting upright in bed talking on the phone, in no acute distress. Cardiovascular: RRR, no murmurs or gallops auscultated  Respiratory: CTAB, breathing comfortably on room air Abdomen: soft, nontender, nondistended, BS+ Extremities: radial and distal pulses present, no LE edema noted MSK: tenderness on deep palpation noted along thoracic spine Neuro: alert and appropriately conversational   Psych: mood appropriate   Laboratory: Recent Labs  Lab 12/27/20 0821 12/28/20 0506 12/30/20 0342  WBC 8.4 7.0 8.9  HGB 10.9* 10.2* 9.2*  HCT 34.1* 31.2* 28.8*  PLT 502* 461* 435*   Recent  Labs  Lab 12/27/20 0644 12/27/20 1111 12/28/20 0506 12/30/20 0342  NA 135  --  133* 135  K 4.5  --  4.2 4.2  CL 103  --  101 101  CO2 23  --  25 27  BUN 11  --  15 16  CREATININE 0.75 0.79 0.77 0.77  CALCIUM 9.1  --  8.6* 8.4*  PROT 8.4*  --   --   --   BILITOT 0.6  --   --   --   ALKPHOS 134*  --   --   --   ALT 9  --   --   --   AST 10*  --   --   --   GLUCOSE 162*  --  143* 177*      Imaging/Diagnostic Tests: No results found.  Donney Dice, DO 12/30/2020, 7:42 AM PGY-1, [facility] Intern pager: [phone], text pages welcome

## 2020-12-30 NOTE — Consult Note (Signed)
   [facility] CM Inpatient Consult   12/30/2020  Christopher Fernandez March 07, 1971 [id number]   [place] Organization [ACO] Patient: [place] plan  Patient is currently active with [facility] for chronic disease management services.  Patient has been engaged by a [facility] Coordinator for the [facility]. Came by and patient is in the middle of patient care.  Chart reviewed for ongoing post hospital needs.   Plan: Patient will be followed by [facility] Coordinator. Primary Care Provider is the [facility]. Patient's Hemoglobin A1C is 9.1. To continue to follow for transition of care needs.  For additional questions or referrals please contact:   Natividad Brood, RN BSN [facility] Liaison  [phone] business mobile phone Toll free office [phone]  Fax number: [phone] [email or web address]

## 2020-12-30 NOTE — Progress Notes (Signed)
Inpatient Diabetes Program Recommendations  AACE/ADA: New Consensus Statement on Inpatient Glycemic Control (2015)  Target Ranges:  Prepandial:   less than 140 mg/dL      Peak postprandial:   less than 180 mg/dL (1-2 hours)      Critically ill patients:  140 - 180 mg/dL   Lab Results  Component Value Date   GLUCAP 153 (H) 12/30/2020   HGBA1C 9.1 (H) 12/27/2020    Review of Glycemic Control  Diabetes history: DM 2 Outpatient Diabetes medications: Glargine 10 units Daily, Januvia 100 mg Daily, Jardiance 25 mg Daily Current orders for Inpatient glycemic control:  Lantus 9 units Metformin 1000 mg bid Novolog 0-9 units tid  A1c 9.1% on 4/15  Inpatient Diabetes Program Recommendations:    Spoke w/pt regarding A1c and glucose control at home. Encouraged pt to follow up with Dr. Valentina Lucks for Education and DM medication titration and to work closely with him for tight glucose control during this time. Encouraged telehealth visits and phone calls if glucose trends are not at goal.  Pt reports battling the infection for awhile now but remembers his A1c being around an 8% about 3 months ago.   Thanks,  Tama Headings RN, MSN, BC-ADM Inpatient Diabetes Coordinator Team Pager [phone] (8a-5p)

## 2020-12-30 NOTE — Progress Notes (Signed)
FPTS Interim Progress Note  Spoke to Dr. Ellene Route with neurosurgery regarding procedure tomorrow. Recommended NPO at midnight along with IVF which will start at midnight. Also recommended to discontinue heparin until after procedure. Placed orders in, appreciate recommendations.   Donney Dice, DO 12/30/2020, 4:12 PM PGY-1, [facility] Service pager [phone]

## 2020-12-30 NOTE — Plan of Care (Signed)
  Problem: Pain Managment: Goal: General experience of comfort will improve Outcome: Progressing   Problem: Safety: Goal: Ability to remain free from injury will improve Outcome: Progressing   Problem: Skin Integrity: Goal: Risk for impaired skin integrity will decrease Outcome: Progressing   

## 2020-12-31 DIAGNOSIS — M4804 Spinal stenosis, thoracic region: Secondary | ICD-10-CM | POA: Diagnosis not present

## 2020-12-31 DIAGNOSIS — M8618 Other acute osteomyelitis, other site: Secondary | ICD-10-CM | POA: Diagnosis not present

## 2020-12-31 DIAGNOSIS — Q8901 Asplenia (congenital): Secondary | ICD-10-CM | POA: Diagnosis not present

## 2020-12-31 DIAGNOSIS — M869 Osteomyelitis, unspecified: Secondary | ICD-10-CM | POA: Diagnosis not present

## 2020-12-31 DIAGNOSIS — B9562 Methicillin resistant Staphylococcus aureus infection as the cause of diseases classified elsewhere: Secondary | ICD-10-CM | POA: Diagnosis not present

## 2020-12-31 DIAGNOSIS — R7881 Bacteremia: Secondary | ICD-10-CM | POA: Diagnosis not present

## 2020-12-31 DIAGNOSIS — G061 Intraspinal abscess and granuloma: Secondary | ICD-10-CM | POA: Diagnosis not present

## 2020-12-31 LAB — CBC
HCT: 28.1 % — ABNORMAL LOW (ref 39.0–52.0)
Hemoglobin: 9.1 g/dL — ABNORMAL LOW (ref 13.0–17.0)
MCH: 28.1 pg (ref 26.0–34.0)
MCHC: 32.4 g/dL (ref 30.0–36.0)
MCV: 86.7 fL (ref 80.0–100.0)
Platelets: 456 10*3/uL — ABNORMAL HIGH (ref 150–400)
RBC: 3.24 MIL/uL — ABNORMAL LOW (ref 4.22–5.81)
RDW: 14.3 % (ref 11.5–15.5)
WBC: 10.3 10*3/uL (ref 4.0–10.5)
nRBC: 0 % (ref 0.0–0.2)

## 2020-12-31 LAB — BASIC METABOLIC PANEL
Anion gap: 7 (ref 5–15)
BUN: 12 mg/dL (ref 6–20)
CO2: 27 mmol/L (ref 22–32)
Calcium: 8.4 mg/dL — ABNORMAL LOW (ref 8.9–10.3)
Chloride: 98 mmol/L (ref 98–111)
Creatinine, Ser: 0.75 mg/dL (ref 0.61–1.24)
GFR, Estimated: >60 mL/min (ref >60)
Glucose, Bld: 205 mg/dL — ABNORMAL HIGH (ref 70–99)
Potassium: 3.8 mmol/L (ref 3.5–5.1)
Sodium: 132 mmol/L — ABNORMAL LOW (ref 135–145)

## 2020-12-31 LAB — GLUCOSE, CAPILLARY
Glucose-Capillary: 244 mg/dL — ABNORMAL HIGH (ref 70–99)
Glucose-Capillary: 168 mg/dL — ABNORMAL HIGH (ref 70–99)
Glucose-Capillary: 144 mg/dL — ABNORMAL HIGH (ref 70–99)

## 2020-12-31 LAB — SURGICAL PCR SCREEN
MRSA, PCR: NEGATIVE
Staphylococcus aureus: NEGATIVE

## 2020-12-31 MED ORDER — HEPARIN SODIUM (PORCINE) 5000 UNIT/ML IJ SOLN
5000.0000 [IU] | Freq: Three times a day (TID) | INTRAMUSCULAR | Status: AC
  Administered 2020-12-31 – 2021-01-02 (×7): 5000 [IU] via SUBCUTANEOUS
  Filled 2020-12-31 (×7): qty 1

## 2020-12-31 MED ORDER — HYDROMORPHONE HCL 1 MG/ML IJ SOLN
1.0000 mg | INTRAMUSCULAR | Status: AC | PRN
  Administered 2020-12-31 (×2): 1 mg via INTRAVENOUS
  Filled 2020-12-31 (×2): qty 1

## 2020-12-31 NOTE — Plan of Care (Signed)
  Problem: Activity: Goal: Risk for activity intolerance will decrease Outcome: Progressing   Problem: Coping: Goal: Level of anxiety will decrease Outcome: Progressing   Problem: Pain Managment: Goal: General experience of comfort will improve Outcome: Progressing   Problem: Safety: Goal: Ability to remain free from injury will improve Outcome: Progressing   Problem: Skin Integrity: Goal: Risk for impaired skin integrity will decrease Outcome: Progressing   

## 2020-12-31 NOTE — H&P (View-Only) (Signed)
Patient ID: Christopher Fernandez, male   DOB: [birth year], 50 y.o.   MRN: [id number] Surgery planned for today was canceled secondary to probable very late start.  I came by to explain reasons for cancellation and plan rescheduling for Friday afternoon.  His family consisting of his wife's father came from [place] and sister who came from [place] were all present.  I explained to them the nature of the situation and planned rescheduling coordinated with thoracic surgery for Friday afternoon.  They are understanding.  I demonstrated the findings on the pathology on the MRI to them so that they can better their understanding of what is occurring.  Wife asked some questions regarding the "cure rate for surgery in this situation I noted that there is a possibility that despite good surgical debridement reconstruction and stabilization he might still have some lingering infection.  This is of course of concern.  Nonetheless we will plan on scheduling and proceeding with surgical decompression at T9-T10 for Friday afternoon.

## 2020-12-31 NOTE — Progress Notes (Signed)
FPTS Interim Progress Note  Just notified by Dr. Ellene Route that procedure will be moved to Friday afternoon at the earliest given ongoing scheduling conflicts in the OR. We can restart DVT ppx at this time and plan for tentative surgery on 4/22. Order placed for heparin subq and diet.   Donney Dice, DO 12/31/2020, 2:29 PM PGY-1, [facility] Service pager [phone]

## 2020-12-31 NOTE — Progress Notes (Signed)
Family Medicine Teaching Service Daily Progress Note Intern Pager: [phone]  Patient name: Christopher Fernandez Medical record number: [id number] Date of birth: 08-Mar-1971 Age: 50 y.o. Gender: male  Primary Care Provider: Danna Hefty, DO Consultants: ID, neurosurgery Code Status: Full  Pt Overview and Major Events to Date:  4/15: Admitted   Assessment and Plan: Christopher Fernandez a 50 y.o.malepresenting with worsening osteomyelitis and concern of new abscess/phlegmon in thoracic spine after having a repeat MRI a few days ago. PMH is significant forhistory of thoracic osteomyelitis, type 2 diabetes, peripheral neuropathy, chronic pancreatitis/pancreatic insufficiency, hyperlipidemia, hypertension, asthma, seasonal allergies, GERD.  Osteomyelitis/discitiswithnew abscess/phlegmoninthoracic spine Patient with previous MRI evidence of of discitis and osteomyelitis at T9-T10 without epidural abscess at that time. Patient was seen by infectious disease yesterday and complained of worsening pain. Patient had MRI on 12/25/2020 that showed worsening progression of osteomyelitis to continuous vertebra at T8 and T11 with circumferential epidural abscess/phlegmon at T9-T10with moderate spinal stenosis at that level.Urine culture notable for 1000 colonies of diphtheroids. Blood cultures notable for no growth for 3 days.  -Neurosurgeryfollowing, appreciate continued involvement and recommendations: plan for surgical decompressiontoday -continue heparin subq, follow neurosurg recs -ID following, appreciate recs including continuedaptomycin -IV daptomycin -Close neurological monitoring to ensure he is not developing neurological signs or symptoms  -Toradol to 30 mg every 6 hours -Oxycodone 5-10 mg as needed for breakthrough -Lidocaine patch -Continue home baclofen -PT/OT evaluate and treat: recommended rolling walker with 5" wheels and 3 in 1 bedside commode  Osteomyelitis right  hip Patient with MRI evidence from 12/11/2010 worsening osteomyelitis of theparasymphyseal right superior pubic ramus with improvement in right abductor minimus myositis. -ID recommendations as above -IV daptomycin  Moderatethoracicspinal stenosis: Most recent MRI the patient was having worsening spinal stenosis at T9-T10 in conjunction with his epidural abscess/phlegmon. This was classified as moderate on the MRI scan.  -Neurosurgery consult, plan for decompression today -f/u neurosurgery recs  T2DM with neuropathy CBG 244 this morning.Home medications include Januvia 100 mg daily, metformin 1000 mg twice daily, sliding scale insulin, 18 units insulin glargine in the mornings, Jardiance 25 mg daily.Also on gabapentin for neuropathy.Most recent A1c 9.1. -Continue home metformin -Hold Jardiance and Januvia -sSSI  Chronic pancreatitis/pancreatic insufficiency Home medications include pancrelipase3 times per day with meals. -We will continue with pancreatic enzymes from our formulary  History of anemia Stable. Hgb 9.1. Per chart review, baseline Hgb 10-12. -monitor CBC  Hyperlipidemia Home medications include Lipitor 40. Last cholesterol panel showsLDL of 34, HDL 34. -Continue home Lipitor  Asthma and history of seasonal allergies Home medications include albuterol as needed, Advair daily, Singulair, Zyrtec. -Continue home medications -Dulera per formulary   History of alcohol abuse/tobacco use disorder Patient states his last drink was about 1.5 years ago. Continues to smoke about half pack per day, previously smoked about a pack per day for 35 years. -nicotine patch as desired  Anxiety Depression Home medications include duloxetine 60 mg daily, Abilify 5 mg daily. -Continue home medications  GERD Home medications include pantoprazole 40 mg twice daily -Continue home medications  History of tremors Home medications include propranolol -Continue  home propranolol  Left foot recent toe amputation Occurred on 3/23. Patient without complaints of pain in that region today. 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 -Continue to monitor  Unintentional weight loss: Weight loss of about 30-35 pounds per patient on admission. Weight stable thus far.  -daily weights -encourage diet -nutrition consult placed, appreciate recs  FEN/GI:  NPO since midnight pending thoracotomy  PPx: none, then likely restart heparin after procedure    Status is: Inpatient  Remains inpatient appropriate because:Inpatient level of care appropriate due to severity of illness   Dispo:  Patient From: Home  Planned Disposition: Home with Health Care Svc  Medically stable for discharge: No          Subjective:  No acute overnight events reported, patient has been NPO at midnight due to scheduled decompression procedure today. Denies any concerns.   Objective: Temp:  [98.3 F (36.8 C)-98.4 F (36.9 C)] 98.4 F (36.9 C) (04/19 0642) Pulse Rate:  [81-87] 87 (04/19 0642) Resp:  [16-20] 20 (04/19 0642) BP: (132-149)/(89-99) 149/99 (04/19 0642) SpO2:  [94 %-99 %] 99 % (04/19 0642) Weight:  [106.5 kg] 106.5 kg (04/18 1046) Physical Exam: General: Patient laying comfortably in bed, in no acute distress. Cardiovascular: RRR, no murmurs or gallops auscultated  Respiratory: CTAB, breathing comfortably on room air Abdomen: soft, nontender, BS+ Extremities: radial and distal pulses present bilaterally, no LE edema noted bilaterally Psych: mood appropriate, pleasant   Laboratory: Recent Labs  Lab 12/28/20 0506 12/30/20 0342 12/31/20 0450  WBC 7.0 8.9 10.3  HGB 10.2* 9.2* 9.1*  HCT 31.2* 28.8* 28.1*  PLT 461* 435* 456*   Recent Labs  Lab 12/27/20 0644 12/27/20 1111 12/28/20 0506 12/30/20 0342 12/31/20 0450  NA 135  --  133* 135 132*  K 4.5  --  4.2 4.2 3.8  CL 103  --  101 101 98  CO2 23  --  25 27 27   BUN 11   --  15 16 12   CREATININE 0.75   < > 0.77 0.77 0.75  CALCIUM 9.1  --  8.6* 8.4* 8.4*  PROT 8.4*  --   --   --   --   BILITOT 0.6  --   --   --   --   ALKPHOS 134*  --   --   --   --   ALT 9  --   --   --   --   AST 10*  --   --   --   --   GLUCOSE 162*  --  143* 177* 205*   < > = values in this interval not displayed.      Imaging/Diagnostic Tests: No results found.  Donney Dice, DO 12/31/2020, 7:53 AM PGY-1, [place] Intern pager: [phone], text pages welcome

## 2020-12-31 NOTE — Progress Notes (Signed)
Patient ID: Christopher Fernandez, male   DOB: 05-18-1971, 50 y.o.   MRN: [id number] Surgery planned for today was canceled secondary to probable very late start.  I came by to explain reasons for cancellation and plan rescheduling for Friday afternoon.  His family consisting of his wife's father came from [place] and sister who came from [place] were all present.  I explained to them the nature of the situation and planned rescheduling coordinated with thoracic surgery for Friday afternoon.  They are understanding.  I demonstrated the findings on the pathology on the MRI to them so that they can better their understanding of what is occurring.  Wife asked some questions regarding the "cure rate for surgery in this situation I noted that there is a possibility that despite good surgical debridement reconstruction and stabilization he might still have some lingering infection.  This is of course of concern.  Nonetheless we will plan on scheduling and proceeding with surgical decompression at T9-T10 for Friday afternoon.

## 2020-12-31 NOTE — Progress Notes (Signed)
PT Cancellation Note  Patient Details Name: Christopher Fernandez MRN: [id number] DOB: 1970-11-24   Cancelled Treatment:    Reason Eval/Treat Not Completed: Patient declined, no reason specified;Other (comment) (Pt declined due to pain and states he "thinks the pain came from therapy 3 days ago because it has been poorly managed since.") Pt wishes to defer PT services until after surgery. Will follow-up post-operatively.     Sandria Manly, SPTA  Sandria Manly 12/31/2020, 3:42 PM

## 2020-12-31 NOTE — Progress Notes (Signed)
Subjective: No new complaints   Antibiotics:  Anti-infectives (From admission, onward)   Start     Dose/Rate Route Frequency Ordered Stop   12/27/20 2000  DAPTOmycin (CUBICIN) 1,000 mg in sodium chloride 0.9 % IVPB        1,000 mg 140 mL/hr over 30 Minutes Intravenous Daily 12/27/20 1143        Medications: Scheduled Meds: . (feeding supplement) PROSource Plus  30 mL Oral TID BM  . ARIPiprazole  5 mg Oral QPM  . atorvastatin  40 mg Oral Daily  . Chlorhexidine Gluconate Cloth  6 each Topical Daily  . docusate sodium  100 mg Oral Daily  . DULoxetine  60 mg Oral Daily  . fluticasone furoate-vilanterol  1 puff Inhalation Daily  . gabapentin  600 mg Oral TID  . insulin aspart  0-9 Units Subcutaneous TID WC  . insulin glargine  9 Units Subcutaneous Daily  . ketorolac  30 mg Intravenous Q6H  . lidocaine  1 patch Transdermal Q24H  . lipase/protease/amylase  36,000 Units Oral TID WC  . loratadine  10 mg Oral Daily  . metFORMIN  1,000 mg Oral BID WC  . mirtazapine  30 mg Oral QHS  . montelukast  10 mg Oral QHS  . multivitamin with minerals  1 tablet Oral Daily  . pantoprazole  40 mg Oral BID  . propranolol ER  80 mg Oral Daily   Continuous Infusions: . sodium chloride 100 mL/hr at 12/31/20 1010  . DAPTOmycin (CUBICIN)  IV 1,000 mg (12/30/20 2042)   PRN Meds:.acetaminophen **OR** acetaminophen, albuterol, baclofen, bismuth subsalicylate, oxyCODONE, sodium chloride flush    Objective: Weight change:   Intake/Output Summary (Last 24 hours) at 12/31/2020 1111 Last data filed at 12/31/2020 0600 Gross per 24 hour  Intake --  Output 775 ml  Net -775 ml   Blood pressure (!) 149/99, pulse 87, temperature 98.4 F (36.9 C), temperature source Oral, resp. rate 20, height 6\' 3"  (1.905 m), weight 106.5 kg, SpO2 96 %. Temp:  [98.3 F (36.8 C)-98.4 F (36.9 C)] 98.4 F (36.9 C) (04/19 0642) Pulse Rate:  [81-87] 87 (04/19 0642) Resp:  [16-20] 20 (04/19 0642) BP:  (132-149)/(89-99) 149/99 (04/19 0642) SpO2:  [94 %-99 %] 96 % (04/19 0852)  Physical Exam: Physical Exam Constitutional:      Appearance: He is well-developed.  HENT:     Head: Normocephalic and atraumatic.  Eyes:     Conjunctiva/sclera: Conjunctivae normal.  Cardiovascular:     Rate and Rhythm: Normal rate and regular rhythm.     Heart sounds: No murmur heard. No gallop.   Pulmonary:     Effort: Pulmonary effort is normal. No respiratory distress.     Breath sounds: Normal breath sounds. No stridor. No wheezing or rhonchi.  Abdominal:     General: Abdomen is flat. There is no distension.     Palpations: Abdomen is soft.  Musculoskeletal:     Cervical back: Normal range of motion and neck supple.  Skin:    General: Skin is warm and dry.     Findings: No erythema or rash.  Neurological:     General: No focal deficit present.     Mental Status: He is alert and oriented to person, place, and time.  Psychiatric:        Mood and Affect: Mood normal.        Behavior: Behavior normal.        Thought Content:  Thought content normal.        Judgment: Judgment normal.      CBC:    BMET Recent Labs    12/30/20 0342 12/31/20 0450  NA 135 132*  K 4.2 3.8  CL 101 98  CO2 27 27  GLUCOSE 177* 205*  BUN 16 12  CREATININE 0.77 0.75  CALCIUM 8.4* 8.4*     Liver Panel  No results for input(s): PROT, ALBUMIN, AST, ALT, ALKPHOS, BILITOT, BILIDIR, IBILI in the last 72 hours.     Sedimentation Rate No results for input(s): ESRSEDRATE in the last 72 hours. C-Reactive Protein No results for input(s): CRP in the last 72 hours.  Micro Results: Recent Results (from the past 720 hour(s))  Surgical pcr screen     Status: Abnormal   Collection Time: 12/01/20 10:15 PM   Specimen: Nasal Mucosa; Nasal Swab  Result Value Ref Range Status   MRSA, PCR POSITIVE (A) NEGATIVE Final    Comment: RESULT CALLED TO, READ BACK BY AND VERIFIED WITH: ROGERS,P RN 0001 12/02/2020  MITCHELL,L    Staphylococcus aureus POSITIVE (A) NEGATIVE Final    Comment: (NOTE) The Xpert SA Assay (FDA approved for NASAL specimens in patients 52 years of age and older), is one component of a comprehensive surveillance program. It is not intended to diagnose infection nor to guide or monitor treatment. Performed at [facility], [place] [address] [place], [place] [address]   Resp Panel by RT-PCR (Flu A&B, Covid) Nasopharyngeal Swab     Status: None   Collection Time: 12/02/20  8:15 AM   Specimen: Nasopharyngeal Swab; Nasopharyngeal(NP) swabs in vial transport medium  Result Value Ref Range Status   SARS Coronavirus 2 by RT PCR NEGATIVE NEGATIVE Final    Comment: (NOTE) SARS-CoV-2 target nucleic acids are NOT DETECTED.  The SARS-CoV-2 RNA is generally detectable in upper respiratory specimens during the acute phase of infection. The lowest concentration of SARS-CoV-2 viral copies this assay can detect is 138 copies/mL. A negative result does not preclude SARS-Cov-2 infection and should not be used as the sole basis for treatment or other patient management decisions. A negative result may occur with  improper specimen collection/handling, submission of specimen other than nasopharyngeal swab, presence of viral mutation(s) within the areas targeted by this assay, and inadequate number of viral copies(<138 copies/mL). A negative result must be combined with clinical observations, patient history, and epidemiological information. The expected result is Negative.  Fact Sheet for Patients:  EntrepreneurPulse.com.au  Fact Sheet for Healthcare Providers:  IncredibleEmployment.be  This test is no t yet approved or cleared by the [place] FDA and  has been authorized for detection and/or diagnosis of SARS-CoV-2 by FDA under an Emergency Use Authorization (EUA). This EUA will remain  in effect (meaning this test can be used) for the  duration of the COVID-19 declaration under Section 564(b)(1) of the Act, 21 U.S.C.section 360bbb-3(b)(1), unless the authorization is terminated  or revoked sooner.       Influenza A by PCR NEGATIVE NEGATIVE Final   Influenza B by PCR NEGATIVE NEGATIVE Final    Comment: (NOTE) The Xpert Xpress SARS-CoV-2/FLU/RSV plus assay is intended as an aid in the diagnosis of influenza from Nasopharyngeal swab specimens and should not be used as a sole basis for treatment. Nasal washings and aspirates are unacceptable for Xpert Xpress SARS-CoV-2/FLU/RSV testing.  Fact Sheet for Patients: EntrepreneurPulse.com.au  Fact Sheet for Healthcare Providers: IncredibleEmployment.be  This test is not yet approved or  cleared by the [place] and has been authorized for detection and/or diagnosis of SARS-CoV-2 by FDA under an Emergency Use Authorization (EUA). This EUA will remain in effect (meaning this test can be used) for the duration of the COVID-19 declaration under Section 564(b)(1) of the Act, 21 U.S.C. section 360bbb-3(b)(1), unless the authorization is terminated or revoked.  Performed at [facility], [place] [address] [place], [place] [address]   Aerobic/Anaerobic Culture w Gram Stain (surgical/deep wound)     Status: None   Collection Time: 12/02/20  4:34 PM   Specimen: PATH Digit amputation; Tissue  Result Value Ref Range Status   Specimen Description TISSUE LEFT TOE  Final   Special Requests PATIENT ON FOLLOWING CEFEPIME MAXIPIME VANC  Final   Gram Stain   Final    RARE WBC PRESENT, PREDOMINANTLY PMN NO ORGANISMS SEEN    Culture   Final    No growth aerobically or anaerobically. Performed at [facility], [place] [address] [place], [place] [address]    Report Status 12/07/2020 FINAL  Final  Culture, blood (routine x 2)     Status: None   Collection Time: 12/04/20 12:14 PM   Specimen: BLOOD  Result Value Ref Range Status    Specimen Description BLOOD LEFT ANTECUBITAL  Final   Special Requests   Final    BOTTLES DRAWN AEROBIC AND ANAEROBIC Blood Culture adequate volume   Culture   Final    NO GROWTH 5 DAYS Performed at [facility], [place] [address] [place], [place] [address]    Report Status 12/09/2020 FINAL  Final  Culture, blood (routine x 2)     Status: None   Collection Time: 12/04/20 12:14 PM   Specimen: BLOOD LEFT FOREARM  Result Value Ref Range Status   Specimen Description BLOOD LEFT FOREARM  Final   Special Requests   Final    BOTTLES DRAWN AEROBIC AND ANAEROBIC Blood Culture adequate volume   Culture   Final    NO GROWTH 5 DAYS Performed at [facility], [place] [address] [place], [place] [address]    Report Status 12/09/2020 FINAL  Final  SARS CORONAVIRUS 2 (TAT 6-24 HRS) Nasopharyngeal Nasopharyngeal Swab     Status: None   Collection Time: 12/27/20  6:16 AM   Specimen: Nasopharyngeal Swab  Result Value Ref Range Status   SARS Coronavirus 2 NEGATIVE NEGATIVE Final    Comment: (NOTE) SARS-CoV-2 target nucleic acids are NOT DETECTED.  The SARS-CoV-2 RNA is generally detectable in upper and lower respiratory specimens during the acute phase of infection. Negative results do not preclude SARS-CoV-2 infection, do not rule out co-infections with other pathogens, and should not be used as the sole basis for treatment or other patient management decisions. Negative results must be combined with clinical observations, patient history, and epidemiological information. The expected result is Negative.  Fact Sheet for Patients: SugarRoll.be  Fact Sheet for Healthcare Providers: https://www.woods-mathews.com/  This test is not yet approved or cleared by the [place] FDA and  has been authorized for detection and/or diagnosis of SARS-CoV-2 by FDA under an Emergency Use Authorization (EUA). This EUA will remain  in effect (meaning this test  can be used) for the duration of the COVID-19 declaration under Se ction 564(b)(1) of the Act, 21 U.S.C. section 360bbb-3(b)(1), unless the authorization is terminated or revoked sooner.  Performed at [facility], [place] [address], [place], [place] [address]   Blood culture (  routine single)     Status: None (Preliminary result)   Collection Time: 12/27/20  6:29 AM   Specimen: BLOOD LEFT HAND  Result Value Ref Range Status   Specimen Description BLOOD LEFT HAND  Final   Special Requests   Final    BOTTLES DRAWN AEROBIC AND ANAEROBIC Blood Culture results may not be optimal due to an inadequate volume of blood received in culture bottles   Culture   Final    NO GROWTH 4 DAYS Performed at [facility], [place] [address], [place], [place] [address]    Report Status PENDING  Incomplete  Urine culture     Status: Abnormal   Collection Time: 12/27/20  9:26 AM   Specimen: In/Out Cath Urine  Result Value Ref Range Status   Specimen Description IN/OUT CATH URINE  Final   Special Requests NONE  Final   Culture (A)  Final    1,000 COLONIES/mL DIPHTHEROIDS(CORYNEBACTERIUM SPECIES) Standardized susceptibility testing for this organism is not available. Performed at [facility], [place] [address] [place], [place] [address]    Report Status 12/28/2020 FINAL  Final  Surgical pcr screen     Status: None   Collection Time: 12/30/20 10:49 PM   Specimen: Nasal Mucosa; Nasal Swab  Result Value Ref Range Status   MRSA, PCR NEGATIVE NEGATIVE Final   Staphylococcus aureus NEGATIVE NEGATIVE Final    Comment: (NOTE) The Xpert SA Assay (FDA approved for NASAL specimens in patients 21 years of age and older), is one component of a comprehensive surveillance program. It is not intended to diagnose infection nor to guide or monitor treatment. Performed at [facility], [place] [address], [place],  [address]     Studies/Results: No results  found.    Assessment/Plan:  INTERVAL HISTORY: Patient going to operating room today   Active Problems:   Osteomyelitis of second toe of left foot (HCC)   Spinal epidural abscess    Christopher Fernandez is a 50 y.o. 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  would get deep cultures from the operating room  Continue IV daptomycin  We will reset the clock on his antibiotics and give him 8 weeks of postoperative systemic antibiotics    LOS: 3 days   Alcide Evener 12/31/2020, 11:11 AM

## 2020-12-31 NOTE — Interval H&P Note (Signed)
History and Physical Interval Note:  12/31/2020 8:07 AM  Christopher Fernandez  has presented today for surgery, with the diagnosis of Osteomyelitits, Epidural abscess.  The various methods of treatment have been discussed with the patient and family. After consideration of risks, benefits and other options for treatment, the patient has consented to  Procedure(s): Right Thoracic 9-Thoracic 10 Thoracotomy (Right) as a surgical intervention.  The patient's history has been reviewed, patient examined, no change in status, stable for surgery.  I have reviewed the patient's chart and labs.  Questions were answered to the patient's satisfaction.     [name]

## 2021-01-01 ENCOUNTER — Ambulatory Visit: Payer: 59 | Admitting: Physician Assistant

## 2021-01-01 ENCOUNTER — Other Ambulatory Visit: Payer: Self-pay | Admitting: *Deleted

## 2021-01-01 DIAGNOSIS — B9562 Methicillin resistant Staphylococcus aureus infection as the cause of diseases classified elsewhere: Secondary | ICD-10-CM | POA: Diagnosis not present

## 2021-01-01 DIAGNOSIS — R7881 Bacteremia: Secondary | ICD-10-CM | POA: Diagnosis not present

## 2021-01-01 DIAGNOSIS — M86151 Other acute osteomyelitis, right femur: Secondary | ICD-10-CM | POA: Diagnosis not present

## 2021-01-01 DIAGNOSIS — G061 Intraspinal abscess and granuloma: Secondary | ICD-10-CM | POA: Diagnosis not present

## 2021-01-01 DIAGNOSIS — M869 Osteomyelitis, unspecified: Secondary | ICD-10-CM | POA: Diagnosis not present

## 2021-01-01 LAB — CBC
HCT: 30.8 % — ABNORMAL LOW (ref 39.0–52.0)
Hemoglobin: 9.6 g/dL — ABNORMAL LOW (ref 13.0–17.0)
MCH: 27.7 pg (ref 26.0–34.0)
MCHC: 31.2 g/dL (ref 30.0–36.0)
MCV: 88.8 fL (ref 80.0–100.0)
Platelets: 480 10*3/uL — ABNORMAL HIGH (ref 150–400)
RBC: 3.47 MIL/uL — ABNORMAL LOW (ref 4.22–5.81)
RDW: 14.5 % (ref 11.5–15.5)
WBC: 8.5 10*3/uL (ref 4.0–10.5)
nRBC: 0 % (ref 0.0–0.2)

## 2021-01-01 LAB — GLUCOSE, CAPILLARY
Glucose-Capillary: 168 mg/dL — ABNORMAL HIGH (ref 70–99)
Glucose-Capillary: 215 mg/dL — ABNORMAL HIGH (ref 70–99)
Glucose-Capillary: 147 mg/dL — ABNORMAL HIGH (ref 70–99)
Glucose-Capillary: 121 mg/dL — ABNORMAL HIGH (ref 70–99)

## 2021-01-01 LAB — BASIC METABOLIC PANEL
Anion gap: 8 (ref 5–15)
BUN: 12 mg/dL (ref 6–20)
CO2: 25 mmol/L (ref 22–32)
Calcium: 8.4 mg/dL — ABNORMAL LOW (ref 8.9–10.3)
Chloride: 101 mmol/L (ref 98–111)
Creatinine, Ser: 0.7 mg/dL (ref 0.61–1.24)
GFR, Estimated: >60 mL/min (ref >60)
Glucose, Bld: 154 mg/dL — ABNORMAL HIGH (ref 70–99)
Potassium: 4.2 mmol/L (ref 3.5–5.1)
Sodium: 134 mmol/L — ABNORMAL LOW (ref 135–145)

## 2021-01-01 LAB — CULTURE, BLOOD (SINGLE): Culture: NO GROWTH

## 2021-01-01 MED ORDER — HYDROMORPHONE HCL 1 MG/ML IJ SOLN
1.0000 mg | INTRAMUSCULAR | Status: AC | PRN
  Administered 2021-01-01 – 2021-01-02 (×4): 1 mg via INTRAVENOUS
  Filled 2021-01-01 (×4): qty 1

## 2021-01-01 MED ORDER — POLYETHYLENE GLYCOL 3350 17 G PO PACK
17.0000 g | PACK | Freq: Every day | ORAL | Status: DC
  Administered 2021-01-01 – 2021-01-05 (×4): 17 g via ORAL
  Filled 2021-01-01 (×5): qty 1

## 2021-01-01 MED ORDER — ALTEPLASE 2 MG IJ SOLR
2.0000 mg | Freq: Once | INTRAMUSCULAR | Status: AC
  Administered 2021-01-01: 2 mg
  Filled 2021-01-01: qty 2

## 2021-01-01 MED ORDER — OXYCODONE HCL 5 MG PO TABS
5.0000 mg | ORAL_TABLET | ORAL | Status: DC
  Administered 2021-01-01 – 2021-01-02 (×6): 5 mg via ORAL
  Filled 2021-01-01 (×6): qty 1

## 2021-01-01 MED ORDER — ACETAMINOPHEN 500 MG PO TABS
1000.0000 mg | ORAL_TABLET | Freq: Four times a day (QID) | ORAL | Status: DC
  Administered 2021-01-01 – 2021-01-17 (×58): 1000 mg via ORAL
  Filled 2021-01-01 (×60): qty 2

## 2021-01-01 NOTE — Progress Notes (Signed)
Family Medicine Teaching Service Daily Progress Note Intern Pager: [phone]  Patient name: Christopher Fernandez Medical record number: [id number] Date of birth: 1971/04/18 Age: 50 y.o. Gender: male  Primary Care Provider: Danna Hefty, DO Consultants: ID, neurosurgery Code Status: Full  Pt Overview and Major Events to Date:  4/15: Admitted   Assessment and Plan: Christopher Fernandez a 50 y.o.malepresenting with worsening osteomyelitis and concern of new abscess/phlegmon in thoracic spine after having a repeat MRI a few days ago. PMH is significant forhistory of thoracic osteomyelitis, type 2 diabetes, peripheral neuropathy, chronic pancreatitis/pancreatic insufficiency, hyperlipidemia, hypertension, asthma, seasonal allergies, GERD.  Osteomyelitis/discitiswithnew abscess/phlegmoninthoracic spine Patient with previous MRI evidence of of discitis and osteomyelitis at T9-T10 without epidural abscess at that time. Patient was seen by infectious disease yesterday and complained of worsening pain. Patient had MRI on 12/25/2020 that showed worsening progression of osteomyelitis to continuous vertebra at T8 and T11 with circumferential epidural abscess/phlegmon at T9-T10with moderate spinal stenosis at that level.Urine culture notable for1000 colonies ofdiphtheroids.Blood cultures notable for no growth for 3 days. -Neurosurgeryfollowing, appreciate continued involvement and recommendations: plan for surgical decompression4/22. Appreciated update from Dr. Ellene Route on 4/19. -continue heparin subq, follow neurosurg recs -ID following, appreciate recs including continuedaptomycin -IV daptomycin -tylenol 1000 mg q6h -oxycodone 5 mg q4h  -IM dilautid 1 mg q3h prn as needed for breakthrough -Lidocaine patch -Continue home baclofen -PT/OT evaluate and treat: recommended rolling walker with 5" wheels and 3 in 1 bedside commode  Osteomyelitis right hip Patient with MRI evidence from  12/11/2010 worsening osteomyelitis of theparasymphyseal right superior pubic ramus with improvement in right abductor minimus myositis. -ID consulted, appreciate continued involvement and recs -IV daptomycin  Moderatethoracicspinal stenosis: Most recent MRI the patient was having worsening spinal stenosis at T9-T10 in conjunction with his epidural abscess/phlegmon. This was classified as moderate on the MRI scan.  -Neurosurgery consult, plan for decompression 4/22 -f/u neurosurgery recs  T2DM with neuropathy CBG 168 this morning.Home medications include Januvia 100 mg daily, metformin 1000 mg twice daily, sliding scale insulin, 18 units insulin glargine in the mornings, Jardiance 25 mg daily.Also on gabapentin for neuropathy.Most recent A1c 9.1. -Continue home metformin -Hold Jardiance and Januvia -sSSI  Elevated blood pressures Has had multiple elevated BP readings, not on any medications for hypertension at home. Takes propranolol for tremors. Likely secondary to pain. -pain regimen as above -monitor BP, consider starting amlodipine 5 mg if needed   Chronic pancreatitis/pancreatic insufficiency Home medications include pancrelipase3 times per day with meals. -We will continue with pancreatic enzymes from our formulary  History of anemia Stable. Hgb9.6. Per chart review, baseline Hgb 10-12. -monitor CBC  Hyperlipidemia Home medications include Lipitor 40. Last cholesterol panel showsLDL of 34, HDL 34. -Continue home Lipitor  Asthma and history of seasonal allergies Home medications include albuterol as needed, Advair daily, Singulair, Zyrtec. -Continue home medications -Dulera per formulary   History of alcohol abuse/tobacco use disorder Patient states his last drink was about 1.5 years ago. Continues to smoke about half pack per day, previously smoked about a pack per day for 35 years. -nicotine patch as desired  Anxiety Depression Home medications  include duloxetine 60 mg daily, Abilify 5 mg daily. -Continue home medications  GERD Home medications include pantoprazole 40 mg twice daily -Continue home medications  History of tremors Home medications include propranolol -Continue home propranolol  Left foot recent toe amputation Occurred on 3/23. Patient without complaints of pain in that region today. 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 -Continue to monitor  Unintentional weight loss: Weight loss of about 30-35 pounds per patient on admission.Weight stable thus far. -daily weights -encourage diet -nutrition consult placed, appreciate recs  FEN/GI: carb modified  PPx: heparin subq   Status is: Inpatient  Remains inpatient appropriate because:Inpatient level of care appropriate due to severity of illness   Dispo:  Patient From: Home  Planned Disposition: Home with Health Care Svc  Medically stable for discharge: No          Subjective:  No significant overnight events reported. Endorsing similar pain of thoracic back, denies other concerns today.   Objective: Temp:  [98.5 F (36.9 C)] 98.5 F (36.9 C) (04/19 2000) Pulse Rate:  [77-87] 87 (04/19 2000) Resp:  [16-17] 16 (04/19 2000) BP: (135-141)/(77-99) 135/77 (04/19 2000) SpO2:  [95 %-96 %] 95 % (04/19 2000) Physical Exam: General: Patient sitting upright, in no acute distress. Cardiovascular: RRR, no murmurs or gallops auscultated  Respiratory: CTAB, no rales or rhonchi noted, breathing comfortably on room air  Abdomen: soft, nontender, nondistended, BS+ MSK: tenderness along thoracic spine without associated erythema or rash Extremities: radial and distal pulses strong and equal bilaterally, no LE edema noted bilaterally Psych: mood appropriate, pleasant   Laboratory: Recent Labs  Lab 12/30/20 0342 12/31/20 0450 01/01/21 0623  WBC 8.9 10.3 8.5  HGB 9.2* 9.1* 9.6*  HCT 28.8* 28.1* 30.8*  PLT 435* 456*  480*   Recent Labs  Lab 12/27/20 0644 12/27/20 1111 12/30/20 0342 12/31/20 0450 01/01/21 0623  NA 135   < > 135 132* 134*  K 4.5   < > 4.2 3.8 4.2  CL 103   < > 101 98 101  CO2 23   < > 27 27 25   BUN 11   < > 16 12 12   CREATININE 0.75   < > 0.77 0.75 0.70  CALCIUM 9.1   < > 8.4* 8.4* 8.4*  PROT 8.4*  --   --   --   --   BILITOT 0.6  --   --   --   --   ALKPHOS 134*  --   --   --   --   ALT 9  --   --   --   --   AST 10*  --   --   --   --   GLUCOSE 162*   < > 177* 205* 154*   < > = values in this interval not displayed.      Imaging/Diagnostic Tests: No results found.  Donney Dice, DO 01/01/2021, 7:55 AM PGY-1, [place] Intern pager: [phone], text pages welcome

## 2021-01-01 NOTE — Progress Notes (Signed)
Patient ID: Christopher Fernandez, male   DOB: 02/03/1971, 50 y.o.   MRN: [id number] Motor function remains intact.  Surgery planned for Friday.

## 2021-01-01 NOTE — Plan of Care (Signed)

## 2021-01-01 NOTE — Progress Notes (Signed)
Subjective:  Concerned about right hip and questions if we should do another MRI   Antibiotics:  Anti-infectives (From admission, onward)   Start     Dose/Rate Route Frequency Ordered Stop   12/27/20 2000  DAPTOmycin (CUBICIN) 1,000 mg in sodium chloride 0.9 % IVPB        1,000 mg 140 mL/hr over 30 Minutes Intravenous Daily 12/27/20 1143        Medications: Scheduled Meds: . (feeding supplement) PROSource Plus  30 mL Oral TID BM  . acetaminophen  1,000 mg Oral Q6H  . ARIPiprazole  5 mg Oral QPM  . atorvastatin  40 mg Oral Daily  . Chlorhexidine Gluconate Cloth  6 each Topical Daily  . docusate sodium  100 mg Oral Daily  . DULoxetine  60 mg Oral Daily  . fluticasone furoate-vilanterol  1 puff Inhalation Daily  . gabapentin  600 mg Oral TID  . heparin injection (subcutaneous)  5,000 Units Subcutaneous Q8H  . insulin aspart  0-9 Units Subcutaneous TID WC  . insulin glargine  9 Units Subcutaneous Daily  . lidocaine  1 patch Transdermal Q24H  . lipase/protease/amylase  36,000 Units Oral TID WC  . loratadine  10 mg Oral Daily  . metFORMIN  1,000 mg Oral BID WC  . mirtazapine  30 mg Oral QHS  . montelukast  10 mg Oral QHS  . multivitamin with minerals  1 tablet Oral Daily  . oxyCODONE  5 mg Oral Q4H  . pantoprazole  40 mg Oral BID  . polyethylene glycol  17 g Oral Daily  . propranolol ER  80 mg Oral Daily   Continuous Infusions: . DAPTOmycin (CUBICIN)  IV 1,000 mg (12/31/20 2121)   PRN Meds:.albuterol, baclofen, bismuth subsalicylate, HYDROmorphone (DILAUDID) injection, sodium chloride flush    Objective: Weight change:   Intake/Output Summary (Last 24 hours) at 01/01/2021 1311 Last data filed at 01/01/2021 0801 Gross per 24 hour  Intake 1650.06 ml  Output --  Net 1650.06 ml   Blood pressure (!) 142/100, pulse 71, temperature 97.7 F (36.5 C), temperature source Oral, resp. rate 18, height 6\' 3"  (1.905 m), weight 106.5 kg, SpO2 98 %. Temp:  [97.7 F  (36.5 C)-98.5 F (36.9 C)] 97.7 F (36.5 C) (04/20 0759) Pulse Rate:  [71-87] 71 (04/20 0759) Resp:  [16-18] 18 (04/20 0759) BP: (135-142)/(77-100) 142/100 (04/20 0759) SpO2:  [95 %-98 %] 98 % (04/20 0759)  Physical Exam: Physical Exam Constitutional:      Appearance: He is well-developed.  HENT:     Head: Normocephalic and atraumatic.  Eyes:     Conjunctiva/sclera: Conjunctivae normal.  Cardiovascular:     Rate and Rhythm: Normal rate and regular rhythm.     Heart sounds: No murmur heard. No gallop.   Pulmonary:     Effort: Pulmonary effort is normal. No respiratory distress.     Breath sounds: Normal breath sounds. No stridor. No wheezing or rhonchi.  Abdominal:     General: Abdomen is flat. There is no distension.     Palpations: Abdomen is soft.  Musculoskeletal:     Cervical back: Normal range of motion and neck supple.     Right hip: Normal.     Left hip: Normal.  Skin:    General: Skin is warm and dry.     Findings: No erythema or rash.  Neurological:     General: No focal deficit present.     Mental Status: He is  alert and oriented to person, place, and time.  Psychiatric:        Mood and Affect: Mood normal.        Behavior: Behavior normal.        Thought Content: Thought content normal.        Judgment: Judgment normal.      CBC:    BMET Recent Labs    12/31/20 0450 01/01/21 0623  NA 132* 134*  K 3.8 4.2  CL 98 101  CO2 27 25  GLUCOSE 205* 154*  BUN 12 12  CREATININE 0.75 0.70  CALCIUM 8.4* 8.4*     Liver Panel  No results for input(s): PROT, ALBUMIN, AST, ALT, ALKPHOS, BILITOT, BILIDIR, IBILI in the last 72 hours.     Sedimentation Rate No results for input(s): ESRSEDRATE in the last 72 hours. C-Reactive Protein No results for input(s): CRP in the last 72 hours.  Micro Results: Recent Results (from the past 720 hour(s))  Aerobic/Anaerobic Culture w Gram Stain (surgical/deep wound)     Status: None   Collection Time: 12/02/20   4:34 PM   Specimen: PATH Digit amputation; Tissue  Result Value Ref Range Status   Specimen Description TISSUE LEFT TOE  Final   Special Requests PATIENT ON FOLLOWING CEFEPIME MAXIPIME VANC  Final   Gram Stain   Final    RARE WBC PRESENT, PREDOMINANTLY PMN NO ORGANISMS SEEN    Culture   Final    No growth aerobically or anaerobically. Performed at [facility], [place] [name], [place], [place] [address]    Report Status 12/07/2020 FINAL  Final  Culture, blood (routine x 2)     Status: None   Collection Time: 12/04/20 12:14 PM   Specimen: BLOOD  Result Value Ref Range Status   Specimen Description BLOOD LEFT ANTECUBITAL  Final   Special Requests   Final    BOTTLES DRAWN AEROBIC AND ANAEROBIC Blood Culture adequate volume   Culture   Final    NO GROWTH 5 DAYS Performed at [facility], [place] [address] [place], [place] [address]    Report Status 12/09/2020 FINAL  Final  Culture, blood (routine x 2)     Status: None   Collection Time: 12/04/20 12:14 PM   Specimen: BLOOD LEFT FOREARM  Result Value Ref Range Status   Specimen Description BLOOD LEFT FOREARM  Final   Special Requests   Final    BOTTLES DRAWN AEROBIC AND ANAEROBIC Blood Culture adequate volume   Culture   Final    NO GROWTH 5 DAYS Performed at [facility], [place] [address], [place], [place] [address]    Report Status 12/09/2020 FINAL  Final  SARS CORONAVIRUS 2 (TAT 6-24 HRS) Nasopharyngeal Nasopharyngeal Swab     Status: None   Collection Time: 12/27/20  6:16 AM   Specimen: Nasopharyngeal Swab  Result Value Ref Range Status   SARS Coronavirus 2 NEGATIVE NEGATIVE Final    Comment: (NOTE) SARS-CoV-2 target nucleic acids are NOT DETECTED.  The SARS-CoV-2 RNA is generally detectable in upper and lower respiratory specimens during the acute phase of infection. Negative results do not preclude SARS-CoV-2 infection, do not rule out co-infections with other pathogens, and should not be used as  the sole basis for treatment or other patient management decisions. Negative results must be combined with clinical observations, patient history, and epidemiological information. The expected result is Negative.  Fact Sheet for Patients: SugarRoll.be  Fact Sheet for Healthcare  Providers: https://www.woods-mathews.com/  This test is not yet approved or cleared by the [place] and  has been authorized for detection and/or diagnosis of SARS-CoV-2 by FDA under an Emergency Use Authorization (EUA). This EUA will remain  in effect (meaning this test can be used) for the duration of the COVID-19 declaration under Se ction 564(b)(1) of the Act, 21 U.S.C. section 360bbb-3(b)(1), unless the authorization is terminated or revoked sooner.  Performed at [facility], [place] [address] [place], [place] [address]   Blood culture (routine single)     Status: None   Collection Time: 12/27/20  6:29 AM   Specimen: BLOOD LEFT HAND  Result Value Ref Range Status   Specimen Description BLOOD LEFT HAND  Final   Special Requests   Final    BOTTLES DRAWN AEROBIC AND ANAEROBIC Blood Culture results may not be optimal due to an inadequate volume of blood received in culture bottles   Culture   Final    NO GROWTH 5 DAYS Performed at [facility], [place] [address] [place], [place] [address]    Report Status 01/01/2021 FINAL  Final  Urine culture     Status: Abnormal   Collection Time: 12/27/20  9:26 AM   Specimen: In/Out Cath Urine  Result Value Ref Range Status   Specimen Description IN/OUT CATH URINE  Final   Special Requests NONE  Final   Culture (A)  Final    1,000 COLONIES/mL DIPHTHEROIDS(CORYNEBACTERIUM SPECIES) Standardized susceptibility testing for this organism is not available. Performed at [facility], [place] [address] [place], [place] [address]    Report Status 12/28/2020 FINAL  Final  Surgical pcr screen     Status: None    Collection Time: 12/30/20 10:49 PM   Specimen: Nasal Mucosa; Nasal Swab  Result Value Ref Range Status   MRSA, PCR NEGATIVE NEGATIVE Final   Staphylococcus aureus NEGATIVE NEGATIVE Final    Comment: (NOTE) The Xpert SA Assay (FDA approved for NASAL specimens in patients 22 years of age and older), is one component of a comprehensive surveillance program. It is not intended to diagnose infection nor to guide or monitor treatment. Performed at [facility], [place] [address], [place], [place] [address]     Studies/Results: No results found.    Assessment/Plan:  INTERVAL HISTORY: Surgery now on Friday   Active Problems:   Osteomyelitis of second toe of left foot ([place])   Spinal epidural abscess    Christopher Fernandez is a 50 y.o. 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  would get deep cultures from the operating room  Continue IV daptomycin  We will reset the clock on his antibiotics and give him 8 weeks of postoperative systemic antibiotics  Osteomyelitis of pubic bone:  Will recheck MRI per patient's request   LOS: 4 days   Alcide Evener 01/01/2021, 1:11 PM

## 2021-01-01 NOTE — Progress Notes (Signed)
Pharmacy Antibiotic Note  Christopher Fernandez is a 50 y.o. male admitted on 12/27/2020 with back pain and worsening epidural abscess on MRI 4/13. 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  Pharmacy has been consulted to continue Daptomycin. Last CK 4/16 was slightly below normal limits at 38. Scr is stable at 0.7 with CrCl 142 ml/min. WBC 8.5, afebrile.   Plan: Continue Daptomycin 1000 mg every 24 hours for 8 additional weeks CK every Saturday Decompression planned for 4/22  Monitor renal function  Monitor signs/symptoms of worsening infection   Height: 6\' 3"  (190.5 cm) Weight: 106.5 kg (234 lb 12.6 oz) IBW/kg (Calculated) : 84.5  Temp (24hrs), Avg:98.2 F (36.8 C), Min:97.7 F (36.5 C), Max:98.5 F (36.9 C)  Recent Labs  Lab 12/27/20 0629 12/27/20 0644 12/27/20 0821 12/27/20 1111 12/28/20 0506 12/30/20 0342 12/31/20 0450 01/01/21 0623  WBC  --   --  8.4  --  7.0 8.9 10.3 8.5  CREATININE  --    < >  --  0.79 0.77 0.77 0.75 0.70  LATICACIDVEN 1.3  --   --   --   --   --   --   --    < > = values in this interval not displayed.    Estimated Creatinine Clearance: 142.5 mL/min (by C-G formula based on SCr of 0.7 mg/dL).    Allergies  Allergen Reactions  . Eggs Or Egg-Derived Products Rash  . Morphine And Related Other (See Comments)    Cant take because of pancreatitis  . Cocoa Rash      Thank you for allowing pharmacy to be a part of this patient's care.  Benna Dunks  PharmD Candidate, Class of 2022

## 2021-01-01 NOTE — Patient Outreach (Signed)
[facility]) Care Management  01/01/2021  Christopher Fernandez [birth year] [id number]  Care Coordination   Transition of Care  Referral received:11/29/20 Initial outreach:12/12/20 Insurance: [employer]    Subjective: Patient active with [facility] care management transition of care  with less than [facility] readmission to [facility] on 12/27/20, Dx: Spinal epidural abscess.  [facility] Liaison made aware of readmission on 01/29/21.   Plan Will follow for disposition plans and updates per [facility] for ongoing transition of care needs.  Joylene Draft, RN, BSN  [place] Management Coordinator  [phone]- Mobile [phone]- Toll Free Main Office

## 2021-01-01 NOTE — Progress Notes (Signed)
OT Cancellation Note  Patient Details Name: STARLIN STEIB MRN: [id number] DOB: [birth year]   Cancelled Treatment:    Reason Eval/Treat Not Completed: Patient declined, wants to wait after surgery to resume OT/PT.  Trevonne Nyland D Ettel Albergo 01/01/2021, 3:57 PM

## 2021-01-01 NOTE — Progress Notes (Signed)
Inpatient Diabetes Program Recommendations  AACE/ADA: New Consensus Statement on Inpatient Glycemic Control (2015)  Target Ranges:  Prepandial:   less than 140 mg/dL      Peak postprandial:   less than 180 mg/dL (1-2 hours)      Critically ill patients:  140 - 180 mg/dL   Lab Results  Component Value Date   GLUCAP 168 (H) 01/01/2021   HGBA1C 9.1 (H) 12/27/2020    Review of Glycemic Control Results for Christopher Fernandez, Christopher Fernandez (MRN [id number]) as of 01/01/2021 09:37  Ref. Range 12/31/2020 06:37 12/31/2020 11:23 12/31/2020 19:44 01/01/2021 06:57  Glucose-Capillary Latest Ref Range: 70 - 99 mg/dL 244 (H) 168 (H) 144 (H) 168 (H)   Diabetes history: DM 2 Outpatient Diabetes medications: Glargine 10 units Daily, Januvia 100 mg Daily, Jardiance 25 mg Daily Current orders for Inpatient glycemic control:  Lantus 9 units Daily Metformin 1000 mg bid Novolog 0-9 units tid  A1c 9.1% on 4/15  Inpatient Diabetes Program Recommendations:    - increase Lantus to 11 units.  Thanks,  Tama Headings RN, MSN, BC-ADM Inpatient Diabetes Coordinator Team Pager [phone] (8a-5p)

## 2021-01-02 ENCOUNTER — Inpatient Hospital Stay (HOSPITAL_COMMUNITY): Payer: 59

## 2021-01-02 DIAGNOSIS — M869 Osteomyelitis, unspecified: Secondary | ICD-10-CM | POA: Diagnosis not present

## 2021-01-02 DIAGNOSIS — M86151 Other acute osteomyelitis, right femur: Secondary | ICD-10-CM | POA: Diagnosis not present

## 2021-01-02 DIAGNOSIS — B9562 Methicillin resistant Staphylococcus aureus infection as the cause of diseases classified elsewhere: Secondary | ICD-10-CM | POA: Diagnosis not present

## 2021-01-02 DIAGNOSIS — R7881 Bacteremia: Secondary | ICD-10-CM | POA: Diagnosis not present

## 2021-01-02 DIAGNOSIS — G061 Intraspinal abscess and granuloma: Secondary | ICD-10-CM | POA: Diagnosis not present

## 2021-01-02 LAB — CBC
HCT: 29.8 % — ABNORMAL LOW (ref 39.0–52.0)
Hemoglobin: 9.4 g/dL — ABNORMAL LOW (ref 13.0–17.0)
MCH: 27.9 pg (ref 26.0–34.0)
MCHC: 31.5 g/dL (ref 30.0–36.0)
MCV: 88.4 fL (ref 80.0–100.0)
Platelets: 532 10*3/uL — ABNORMAL HIGH (ref 150–400)
RBC: 3.37 MIL/uL — ABNORMAL LOW (ref 4.22–5.81)
RDW: 14.3 % (ref 11.5–15.5)
WBC: 9 10*3/uL (ref 4.0–10.5)
nRBC: 0 % (ref 0.0–0.2)

## 2021-01-02 LAB — BASIC METABOLIC PANEL
Anion gap: 7 (ref 5–15)
BUN: 13 mg/dL (ref 6–20)
CO2: 28 mmol/L (ref 22–32)
Calcium: 8.6 mg/dL — ABNORMAL LOW (ref 8.9–10.3)
Chloride: 100 mmol/L (ref 98–111)
Creatinine, Ser: 0.72 mg/dL (ref 0.61–1.24)
GFR, Estimated: >60 mL/min (ref >60)
Glucose, Bld: 166 mg/dL — ABNORMAL HIGH (ref 70–99)
Potassium: 4 mmol/L (ref 3.5–5.1)
Sodium: 135 mmol/L (ref 135–145)

## 2021-01-02 LAB — GLUCOSE, CAPILLARY
Glucose-Capillary: 174 mg/dL — ABNORMAL HIGH (ref 70–99)
Glucose-Capillary: 123 mg/dL — ABNORMAL HIGH (ref 70–99)
Glucose-Capillary: 124 mg/dL — ABNORMAL HIGH (ref 70–99)
Glucose-Capillary: 139 mg/dL — ABNORMAL HIGH (ref 70–99)

## 2021-01-02 MED ORDER — PROCHLORPERAZINE MALEATE 5 MG PO TABS
5.0000 mg | ORAL_TABLET | Freq: Four times a day (QID) | ORAL | Status: DC | PRN
  Administered 2021-01-02 – 2021-01-03 (×2): 5 mg via ORAL
  Filled 2021-01-02 (×3): qty 1

## 2021-01-02 MED ORDER — AMLODIPINE BESYLATE 2.5 MG PO TABS
2.5000 mg | ORAL_TABLET | Freq: Every day | ORAL | Status: DC
  Administered 2021-01-02 – 2021-01-04 (×3): 2.5 mg via ORAL
  Filled 2021-01-02 (×3): qty 1

## 2021-01-02 MED ORDER — OXYCODONE HCL 5 MG PO TABS
10.0000 mg | ORAL_TABLET | ORAL | Status: DC
  Administered 2021-01-02 – 2021-01-10 (×45): 10 mg via ORAL
  Filled 2021-01-02 (×45): qty 2

## 2021-01-02 MED ORDER — HYDROMORPHONE HCL 1 MG/ML IJ SOLN
0.5000 mg | INTRAMUSCULAR | Status: DC | PRN
  Administered 2021-01-02 – 2021-01-04 (×6): 0.5 mg via INTRAVENOUS
  Filled 2021-01-02: qty 0.5
  Filled 2021-01-02: qty 1
  Filled 2021-01-02 (×4): qty 0.5

## 2021-01-02 MED ORDER — LORAZEPAM 1 MG PO TABS
1.0000 mg | ORAL_TABLET | Freq: Once | ORAL | Status: AC
  Administered 2021-01-02: 1 mg via ORAL
  Filled 2021-01-02: qty 1

## 2021-01-02 MED ORDER — HYDROMORPHONE HCL 1 MG/ML IJ SOLN
1.0000 mg | Freq: Once | INTRAMUSCULAR | Status: AC
  Administered 2021-01-02: 1 mg via INTRAVENOUS
  Filled 2021-01-02: qty 1

## 2021-01-02 MED ORDER — GADOBUTROL 1 MMOL/ML IV SOLN
10.0000 mL | Freq: Once | INTRAVENOUS | Status: AC | PRN
  Administered 2021-01-02: 10 mL via INTRAVENOUS

## 2021-01-02 NOTE — Plan of Care (Signed)
  Problem: Education: Goal: Knowledge of General Education information will improve Description: Including pain rating scale, medication(s)/side effects and non-pharmacologic comfort measures Outcome: Progressing   Problem: Health Behavior/Discharge Planning: Goal: Ability to manage health-related needs will improve Outcome: Progressing   Problem: Clinical Measurements: Goal: Ability to maintain clinical measurements within normal limits will improve Outcome: Progressing   Problem: Coping: Goal: Level of anxiety will decrease Outcome: Progressing   Problem: Safety: Goal: Ability to remain free from injury will improve Outcome: Progressing   Problem: Activity: Goal: Risk for activity intolerance will decrease Outcome: Not Progressing   Problem: Nutrition: Goal: Adequate nutrition will be maintained Outcome: Not Progressing   Problem: Pain Managment: Goal: General experience of comfort will improve Outcome: Not Progressing

## 2021-01-02 NOTE — Progress Notes (Signed)
Subjective:  He was not able to tolerate lying flat for MRI of pelvis with only a half a milligram of Dilaudid   Antibiotics:  Anti-infectives (From admission, onward)   Start     Dose/Rate Route Frequency Ordered Stop   12/27/20 2000  DAPTOmycin (CUBICIN) 1,000 mg in sodium chloride 0.9 % IVPB        1,000 mg 140 mL/hr over 30 Minutes Intravenous Daily 12/27/20 1143        Medications: Scheduled Meds: . (feeding supplement) PROSource Plus  30 mL Oral TID BM  . acetaminophen  1,000 mg Oral Q6H  . amLODipine  2.5 mg Oral Daily  . ARIPiprazole  5 mg Oral QPM  . atorvastatin  40 mg Oral Daily  . Chlorhexidine Gluconate Cloth  6 each Topical Daily  . docusate sodium  100 mg Oral Daily  . DULoxetine  60 mg Oral Daily  . fluticasone furoate-vilanterol  1 puff Inhalation Daily  . gabapentin  600 mg Oral TID  . insulin aspart  0-9 Units Subcutaneous TID WC  . insulin glargine  9 Units Subcutaneous Daily  . lidocaine  1 patch Transdermal Q24H  . lipase/protease/amylase  36,000 Units Oral TID WC  . loratadine  10 mg Oral Daily  . metFORMIN  1,000 mg Oral BID WC  . mirtazapine  30 mg Oral QHS  . montelukast  10 mg Oral QHS  . multivitamin with minerals  1 tablet Oral Daily  . oxyCODONE  10 mg Oral Q4H  . pantoprazole  40 mg Oral BID  . polyethylene glycol  17 g Oral Daily  . propranolol ER  80 mg Oral Daily   Continuous Infusions: . DAPTOmycin (CUBICIN)  IV 1,000 mg (01/01/21 2059)   PRN Meds:.albuterol, baclofen, bismuth subsalicylate, HYDROmorphone (DILAUDID) injection, prochlorperazine, sodium chloride flush    Objective: Weight change:   Intake/Output Summary (Last 24 hours) at 01/02/2021 1323 Last data filed at 01/02/2021 0030 Gross per 24 hour  Intake --  Output 1000 ml  Net -1000 ml   Blood pressure (!) 135/95, pulse 90, temperature 98.5 F (36.9 C), temperature source Oral, resp. rate 17, height 6\' 3"  (1.905 m), weight 106.5 kg, SpO2 96 %. Temp:   [98.5 F (36.9 C)-98.8 F (37.1 C)] 98.5 F (36.9 C) (04/21 1009) Pulse Rate:  [80-90] 90 (04/21 1009) Resp:  [14-17] 17 (04/21 1009) BP: (135-143)/(88-102) 135/95 (04/21 1009) SpO2:  [96 %-97 %] 96 % (04/21 1009)  Physical Exam: Physical Exam Constitutional:      Appearance: He is well-developed.  HENT:     Head: Normocephalic and atraumatic.  Eyes:     Conjunctiva/sclera: Conjunctivae normal.  Cardiovascular:     Rate and Rhythm: Normal rate and regular rhythm.     Heart sounds: No murmur heard. No gallop.   Pulmonary:     Effort: Pulmonary effort is normal. No respiratory distress.     Breath sounds: Normal breath sounds. No stridor. No wheezing or rhonchi.  Abdominal:     General: Abdomen is flat. There is no distension.     Palpations: Abdomen is soft.  Musculoskeletal:     Cervical back: Normal range of motion and neck supple.  Skin:    General: Skin is warm and dry.     Findings: No erythema or rash.  Neurological:     General: No focal deficit present.     Mental Status: He is alert and oriented to person, place, and  time.  Psychiatric:        Mood and Affect: Mood normal.        Behavior: Behavior normal.        Thought Content: Thought content normal.        Judgment: Judgment normal.      CBC:    BMET Recent Labs    01/01/21 0623 01/02/21 0417  NA 134* 135  K 4.2 4.0  CL 101 100  CO2 25 28  GLUCOSE 154* 166*  BUN 12 13  CREATININE 0.70 0.72  CALCIUM 8.4* 8.6*     Liver Panel  No results for input(s): PROT, ALBUMIN, AST, ALT, ALKPHOS, BILITOT, BILIDIR, IBILI in the last 72 hours.     Sedimentation Rate No results for input(s): ESRSEDRATE in the last 72 hours. C-Reactive Protein No results for input(s): CRP in the last 72 hours.  Micro Results: Recent Results (from the past 720 hour(s))  Culture, blood (routine x 2)     Status: None   Collection Time: 12/04/20 12:14 PM   Specimen: BLOOD  Result Value Ref Range Status   Specimen  Description BLOOD LEFT ANTECUBITAL  Final   Special Requests   Final    BOTTLES DRAWN AEROBIC AND ANAEROBIC Blood Culture adequate volume   Culture   Final    NO GROWTH 5 DAYS Performed at [facility], [address] [place], [place] [address]    Report Status 12/09/2020 FINAL  Final  Culture, blood (routine x 2)     Status: None   Collection Time: 12/04/20 12:14 PM   Specimen: BLOOD LEFT FOREARM  Result Value Ref Range Status   Specimen Description BLOOD LEFT FOREARM  Final   Special Requests   Final    BOTTLES DRAWN AEROBIC AND ANAEROBIC Blood Culture adequate volume   Culture   Final    NO GROWTH 5 DAYS Performed at [facility], [place] [address], [place], [place] [address]    Report Status 12/09/2020 FINAL  Final  SARS CORONAVIRUS 2 (TAT 6-24 HRS) Nasopharyngeal Nasopharyngeal Swab     Status: None   Collection Time: 12/27/20  6:16 AM   Specimen: Nasopharyngeal Swab  Result Value Ref Range Status   SARS Coronavirus 2 NEGATIVE NEGATIVE Final    Comment: (NOTE) SARS-CoV-2 target nucleic acids are NOT DETECTED.  The SARS-CoV-2 RNA is generally detectable in upper and lower respiratory specimens during the acute phase of infection. Negative results do not preclude SARS-CoV-2 infection, do not rule out co-infections with other pathogens, and should not be used as the sole basis for treatment or other patient management decisions. Negative results must be combined with clinical observations, patient history, and epidemiological information. The expected result is Negative.  Fact Sheet for Patients: SugarRoll.be  Fact Sheet for Healthcare Providers: https://www.woods-mathews.com/  This test is not yet approved or cleared by the [place] FDA and  has been authorized for detection and/or diagnosis of SARS-CoV-2 by FDA under an Emergency Use Authorization (EUA). This EUA will remain  in effect (meaning this test can be  used) for the duration of the COVID-19 declaration under Se ction 564(b)(1) of the Act, 21 U.S.C. section 360bbb-3(b)(1), unless the authorization is terminated or revoked sooner.  Performed at [facility], [place] [address] [place], [place] [address]   Blood culture (routine single)     Status: None   Collection Time: 12/27/20  6:29 AM   Specimen: BLOOD LEFT HAND  Result Value Ref  Range Status   Specimen Description BLOOD LEFT HAND  Final   Special Requests   Final    BOTTLES DRAWN AEROBIC AND ANAEROBIC Blood Culture results may not be optimal due to an inadequate volume of blood received in culture bottles   Culture   Final    NO GROWTH 5 DAYS Performed at [facility], [place] [address], [place], [place] [address]    Report Status 01/01/2021 FINAL  Final  Urine culture     Status: Abnormal   Collection Time: 12/27/20  9:26 AM   Specimen: In/Out Cath Urine  Result Value Ref Range Status   Specimen Description IN/OUT CATH URINE  Final   Special Requests NONE  Final   Culture (A)  Final    1,000 COLONIES/mL DIPHTHEROIDS(CORYNEBACTERIUM SPECIES) Standardized susceptibility testing for this organism is not available. Performed at [facility], [place] [address], [place], [place] [address]    Report Status 12/28/2020 FINAL  Final  Surgical pcr screen     Status: None   Collection Time: 12/30/20 10:49 PM   Specimen: Nasal Mucosa; Nasal Swab  Result Value Ref Range Status   MRSA, PCR NEGATIVE NEGATIVE Final   Staphylococcus aureus NEGATIVE NEGATIVE Final    Comment: (NOTE) The Xpert SA Assay (FDA approved for NASAL specimens in patients 77 years of age and older), is one component of a comprehensive surveillance program. It is not intended to diagnose infection nor to guide or monitor treatment. Performed at [facility], [place] [address] [place], [place] [address]     Studies/Results: No results found.    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  would get deep cultures from the operating room  Continue IV daptomycin  We will reset the clock on his antibiotics and give him 8 weeks of postoperative systemic antibiotics  Osteomyelitis of pubic bone:  Would repeat the MRI but with sufficient pain control.  Would support him getting a milligram of Dilaudid or what ever he may need to build to lie on the MRI machine and stay still.     LOS: 5 days   Alcide Evener 01/02/2021, 1:23 PM

## 2021-01-02 NOTE — Progress Notes (Signed)
FPTS Interim Progress Note  Paged regarding patient wanting pain medication prior to MRI. MRI ordered per patient preference as stated in ID note. Notified by nurse that patient had to be brought back from MRI because he said that he would be in so much pain to be able to withstand imaging. Went to bedside to speak to patient. Patient's sister and father both also present at bedside. Patient is wanting this MRI performed as he discussed with ID earlier today. I suggested if he would like to wait until his pain is slightly more under control prior to further imaging and he states that he wants to be proactive and requests that we get this imaging done with pain medication to do so. I told him I would discuss with the team and determine a plan of action. He is agreeable and does not have any further questions or concerns. Determined to be appropriate to give 1 mg dliaudid per ID, order placed only to be given immediately prior to MRI.   Donney Dice, DO 01/02/2021, 1:33 PM PGY-1, [facility] Service pager [phone]

## 2021-01-02 NOTE — Progress Notes (Addendum)
Family Medicine Teaching Service Daily Progress Note Intern Pager: [phone]  Patient name: Christopher Fernandez Medical record number: [id number] Date of birth: [birth year] Age: 50 y.o. Gender: male  Primary Care Provider: Danna Hefty, DO Consultants: ID, neurosurgery Code Status: Full  Pt Overview and Major Events to Date:  4/15: Admitted   Assessment and Plan: Christopher Fernandez a 50 y.o.malepresenting with worsening osteomyelitis and concern of new abscess/phlegmon in thoracic spine after having a repeat MRI a few days ago. PMH is significant forhistory of thoracic osteomyelitis, type 2 diabetes, peripheral neuropathy, chronic pancreatitis/pancreatic insufficiency, hyperlipidemia, hypertension, asthma, seasonal allergies, GERD.  Osteomyelitis/discitiswithnew abscess/phlegmoninthoracic spine Patient with previous MRI evidence of of discitis and osteomyelitis at T9-T10 without epidural abscess at that time. Patient was seen by infectious disease yesterday and complained of worsening pain. Patient had MRI on 12/25/2020 that showed worsening progression of osteomyelitis to continuous vertebra at T8 and T11 with circumferential epidural abscess/phlegmon at T9-T10with moderate spinal stenosis at that level.Urine culture notable for1000 colonies ofdiphtheroids.Blood cultures notable for no growth for 3 days. -Neurosurgeryfollowing, appreciate continued involvement and recommendations: plan for surgical decompression4/22. Appreciated update from Dr. Ellene Route on 4/19. -continue heparin subq, follow neurosurg recs -ID following, appreciate recs including continuedaptomycin -IV daptomycin -tylenol 1000 mg q6h -oxycodone 5 mg q4h, consider increasing to oxy 10 given that is his home med -IM dilautid 0.5 mg q3h prn as needed for breakthrough -Lidocaine patch -Continue home baclofen -PT/OT evaluate and treat: recommended rolling walker with 5" wheels and 3 in 1 bedside  commode  Osteomyelitis right hip Patient with MRI evidence from 12/11/2010 worsening osteomyelitis of theparasymphyseal right superior pubic ramus with improvement in right abductor minimus myositis. -ID consulted, appreciate continued involvement and recs -IV daptomycin  Moderatethoracicspinal stenosis: Most recent MRI the patient was having worsening spinal stenosis at T9-T10 in conjunction with his epidural abscess/phlegmon. This was classified as moderate on the MRI scan.  -Neurosurgery consult, plan for decompression4/22 -f/u neurosurgery recs  Nausea Likely secondary to pain, most recent QTc 438 on 4/15. -repeat EKG -compazine 5 mg  T2DM with neuropathy [id number] morning.Home medications include Januvia 100 mg daily, metformin 1000 mg twice daily, sliding scale insulin, 18 units insulin glargine in the mornings, Jardiance 25 mg daily.Also on gabapentin for neuropathy.Most recent A1c 9.1. -Continue home metformin -Hold Jardiance and Januvia -sSSI -Lantus 9 units, consider increasing to 11 units per diabetes coordinator   Elevated blood pressures Still elevated this morning BP 143/102. Takes propranolol for tremors. Likely secondary to pain as he is still complaining of pain. -pain regimen as above -monitor BP, consider starting amlodipine 5 mg if needed   Chronic pancreatitis/pancreatic insufficiency Home medications include pancrelipase3 times per day with meals. -We will continue with pancreatic enzymes from our formulary  History of anemia Stable.Hgb9.4. Per chart review, baseline Hgb 10-12. -monitor CBC  Hyperlipidemia Home medications include Lipitor 40. Last cholesterol panel showsLDL of 34, HDL 34. -Continue home Lipitor  Asthma and history of seasonal allergies Home medications include albuterol as needed, Advair daily, Singulair, Zyrtec. -Continue home medications -Dulera per formulary   History of alcohol abuse/tobacco use  disorder Patient states his last drink was about 1.5 years ago. Continues to smoke about half pack per day, previously smoked about a pack per day for 35 years. -nicotine patch as desired  Anxiety Depression Home medications include duloxetine 60 mg daily, Abilify 5 mg daily. -Continue home medications  GERD Home medications include pantoprazole 40 mg twice daily -Continue home medications  History of tremors Home medications include propranolol -Continue home propranolol  Left foot recent toe amputation Occurred on 3/23. Patient without complaints of pain in that region today. 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 -Continue to monitor  Unintentional weight loss: Weight loss of about 30-35 pounds per patient on admission.Weight stable thus far. -daily weights -encourage diet -nutrition consulted  FEN/GI: carb modified  PPx: heparin subq    Status is: Inpatient  Remains inpatient appropriate because:Inpatient level of care appropriate due to severity of illness   Dispo:  Patient From: Home  Planned Disposition: Home with Health Care Svc  Medically stable for discharge: No          Subjective:  No overnight events reported. Patient complaining of 7/10 pain that is essentially worse than yesterday. Also complaining of nausea. Denies any other concerns at this time.   Objective: Temp:  [97.7 F (36.5 C)-98.8 F (37.1 C)] 98.5 F (36.9 C) (04/21 0409) Pulse Rate:  [71-85] 85 (04/21 0409) Resp:  [14-18] 16 (04/21 0409) BP: (137-143)/(88-102) 143/102 (04/21 0409) SpO2:  [97 %-98 %] 97 % (04/21 0409) Physical Exam: General: Patient sitting upright in bed, in no acute distress. Cardiovascular: RRR, no murmurs or gallops auscultated  Respiratory: CTAB, no rales, rales or wheezing noted  Abdomen: soft, nontender, BS+ MSK: tenderness along thoracic spine, more prominently along T8-T12 Extremities: radial and distal pulses  present, no LE edema noted bilaterally Psych: mood appropriate   Laboratory: Recent Labs  Lab 12/31/20 0450 01/01/21 0623 01/02/21 0417  WBC 10.3 8.5 9.0  HGB 9.1* 9.6* 9.4*  HCT 28.1* 30.8* 29.8*  PLT 456* 480* 532*   Recent Labs  Lab 12/27/20 0644 12/27/20 1111 12/31/20 0450 01/01/21 0623 01/02/21 0417  NA 135   < > 132* 134* 135  K 4.5   < > 3.8 4.2 4.0  CL 103   < > 98 101 100  CO2 23   < > 27 25 28   BUN 11   < > 12 12 13   CREATININE 0.75   < > 0.75 0.70 0.72  CALCIUM 9.1   < > 8.4* 8.4* 8.6*  PROT 8.4*  --   --   --   --   BILITOT 0.6  --   --   --   --   ALKPHOS 134*  --   --   --   --   ALT 9  --   --   --   --   AST 10*  --   --   --   --   GLUCOSE 162*   < > 205* 154* 166*   < > = values in this interval not displayed.      Imaging/Diagnostic Tests: No results found.  Donney Dice, DO 01/02/2021, 6:38 AM PGY-1, [facility] Intern pager: [phone], text pages welcome

## 2021-01-03 ENCOUNTER — Inpatient Hospital Stay (HOSPITAL_COMMUNITY): Payer: 59

## 2021-01-03 ENCOUNTER — Encounter (HOSPITAL_COMMUNITY)
Admission: EM | Disposition: A | Discharge status: Home / Self Care | Payer: Self-pay | Source: Home / Self Care | Attending: Family Medicine

## 2021-01-03 ENCOUNTER — Inpatient Hospital Stay (HOSPITAL_COMMUNITY): Payer: 59 | Admitting: Certified Registered"

## 2021-01-03 ENCOUNTER — Encounter (HOSPITAL_COMMUNITY): Payer: Self-pay | Admitting: Family Medicine

## 2021-01-03 DIAGNOSIS — G992 Myelopathy in diseases classified elsewhere: Secondary | ICD-10-CM

## 2021-01-03 DIAGNOSIS — G0489 Other myelitis: Secondary | ICD-10-CM

## 2021-01-03 DIAGNOSIS — M4624 Osteomyelitis of vertebra, thoracic region: Secondary | ICD-10-CM

## 2021-01-03 DIAGNOSIS — R7881 Bacteremia: Secondary | ICD-10-CM | POA: Diagnosis not present

## 2021-01-03 DIAGNOSIS — B9562 Methicillin resistant Staphylococcus aureus infection as the cause of diseases classified elsewhere: Secondary | ICD-10-CM | POA: Diagnosis not present

## 2021-01-03 DIAGNOSIS — M4804 Spinal stenosis, thoracic region: Secondary | ICD-10-CM | POA: Diagnosis not present

## 2021-01-03 DIAGNOSIS — M869 Osteomyelitis, unspecified: Secondary | ICD-10-CM | POA: Diagnosis not present

## 2021-01-03 DIAGNOSIS — G061 Intraspinal abscess and granuloma: Secondary | ICD-10-CM

## 2021-01-03 HISTORY — PX: THORACIC EXPOSURE: SHX6114

## 2021-01-03 HISTORY — PX: THRANSTHORACIC VERTEBRECTOMY: SHX5376

## 2021-01-03 LAB — POCT I-STAT 7, (LYTES, BLD GAS, ICA,H+H)
Acid-Base Excess: 2 mmol/L (ref 0.0–2.0)
Acid-Base Excess: 2 mmol/L (ref 0.0–2.0)
Bicarbonate: 28.1 mmol/L — ABNORMAL HIGH (ref 20.0–28.0)
Bicarbonate: 29.8 mmol/L — ABNORMAL HIGH (ref 20.0–28.0)
Calcium, Ion: 1.2 mmol/L (ref 1.15–1.40)
Calcium, Ion: 1.16 mmol/L (ref 1.15–1.40)
HCT: 33 % — ABNORMAL LOW (ref 39.0–52.0)
HCT: 36 % — ABNORMAL LOW (ref 39.0–52.0)
Hemoglobin: 11.2 g/dL — ABNORMAL LOW (ref 13.0–17.0)
Hemoglobin: 12.2 g/dL — ABNORMAL LOW (ref 13.0–17.0)
O2 Saturation: 94 %
O2 Saturation: 100 %
Patient temperature: 36.3
Patient temperature: 35.9
Potassium: 4.5 mmol/L (ref 3.5–5.1)
Potassium: 6 mmol/L — ABNORMAL HIGH (ref 3.5–5.1)
Sodium: 133 mmol/L — ABNORMAL LOW (ref 135–145)
Sodium: 131 mmol/L — ABNORMAL LOW (ref 135–145)
TCO2: 30 mmol/L (ref 22–32)
TCO2: 32 mmol/L (ref 22–32)
pCO2 arterial: 49.6 mmHg — ABNORMAL HIGH (ref 32.0–48.0)
pCO2 arterial: 61.2 mmHg — ABNORMAL HIGH (ref 32.0–48.0)
pH, Arterial: 7.358 (ref 7.350–7.450)
pH, Arterial: 7.29 — ABNORMAL LOW (ref 7.350–7.450)
pO2, Arterial: 72 mmHg — ABNORMAL LOW (ref 83.0–108.0)
pO2, Arterial: 271 mmHg — ABNORMAL HIGH (ref 83.0–108.0)

## 2021-01-03 LAB — BASIC METABOLIC PANEL
Anion gap: 8 (ref 5–15)
BUN: 13 mg/dL (ref 6–20)
CO2: 28 mmol/L (ref 22–32)
Calcium: 8.7 mg/dL — ABNORMAL LOW (ref 8.9–10.3)
Chloride: 96 mmol/L — ABNORMAL LOW (ref 98–111)
Creatinine, Ser: 0.7 mg/dL (ref 0.61–1.24)
GFR, Estimated: >60 mL/min (ref >60)
Glucose, Bld: 168 mg/dL — ABNORMAL HIGH (ref 70–99)
Potassium: 4.1 mmol/L (ref 3.5–5.1)
Sodium: 132 mmol/L — ABNORMAL LOW (ref 135–145)

## 2021-01-03 LAB — GLUCOSE, CAPILLARY
Glucose-Capillary: 119 mg/dL — ABNORMAL HIGH (ref 70–99)
Glucose-Capillary: 134 mg/dL — ABNORMAL HIGH (ref 70–99)
Glucose-Capillary: 134 mg/dL — ABNORMAL HIGH (ref 70–99)
Glucose-Capillary: 169 mg/dL — ABNORMAL HIGH (ref 70–99)
Glucose-Capillary: 187 mg/dL — ABNORMAL HIGH (ref 70–99)
Glucose-Capillary: 241 mg/dL — ABNORMAL HIGH (ref 70–99)

## 2021-01-03 LAB — CBC
HCT: 29.2 % — ABNORMAL LOW (ref 39.0–52.0)
Hemoglobin: 9.3 g/dL — ABNORMAL LOW (ref 13.0–17.0)
MCH: 27.8 pg (ref 26.0–34.0)
MCHC: 31.8 g/dL (ref 30.0–36.0)
MCV: 87.2 fL (ref 80.0–100.0)
Platelets: 528 10*3/uL — ABNORMAL HIGH (ref 150–400)
RBC: 3.35 MIL/uL — ABNORMAL LOW (ref 4.22–5.81)
RDW: 14.3 % (ref 11.5–15.5)
WBC: 8.8 10*3/uL (ref 4.0–10.5)
nRBC: 0 % (ref 0.0–0.2)

## 2021-01-03 LAB — PREPARE RBC (CROSSMATCH)

## 2021-01-03 SURGERY — TRANSTHORACIC VERTEBRECTOMY
Anesthesia: General | Site: Chest | Laterality: Right

## 2021-01-03 MED ORDER — PROMETHAZINE HCL 25 MG/ML IJ SOLN: 6.2500 mg | INTRAMUSCULAR | Status: DC | PRN

## 2021-01-03 MED ORDER — FENTANYL CITRATE (PF) 250 MCG/5ML IJ SOLN
INTRAMUSCULAR | Status: DC | PRN
  Administered 2021-01-03 (×2): 50 ug via INTRAVENOUS
  Administered 2021-01-03: 25 ug via INTRAVENOUS
  Administered 2021-01-03: 100 ug via INTRAVENOUS
  Administered 2021-01-03 (×2): 50 ug via INTRAVENOUS
  Administered 2021-01-03: 100 ug via INTRAVENOUS
  Administered 2021-01-03: 25 ug via INTRAVENOUS

## 2021-01-03 MED ORDER — BUPIVACAINE HCL (PF) 0.25 % IJ SOLN
INTRAMUSCULAR | Status: AC
  Filled 2021-01-03: qty 30

## 2021-01-03 MED ORDER — FENTANYL CITRATE (PF) 250 MCG/5ML IJ SOLN
INTRAMUSCULAR | Status: AC
  Filled 2021-01-03: qty 5

## 2021-01-03 MED ORDER — ACETAMINOPHEN 325 MG PO TABS: 650.0000 mg | ORAL_TABLET | ORAL | Status: DC | PRN

## 2021-01-03 MED ORDER — ONDANSETRON HCL 4 MG/2ML IJ SOLN: 4.0000 mg | Freq: Four times a day (QID) | INTRAMUSCULAR | Status: DC | PRN

## 2021-01-03 MED ORDER — ENOXAPARIN SODIUM 40 MG/0.4ML ~~LOC~~ SOLN
40.0000 mg | SUBCUTANEOUS | Status: DC
  Administered 2021-01-04: 40 mg via SUBCUTANEOUS
  Filled 2021-01-03: qty 0.4

## 2021-01-03 MED ORDER — LIDOCAINE 2% (20 MG/ML) 5 ML SYRINGE
INTRAMUSCULAR | Status: AC
  Filled 2021-01-03: qty 5

## 2021-01-03 MED ORDER — PHENOL 1.4 % MT LIQD: 1.0000 | OROMUCOSAL | Status: DC | PRN

## 2021-01-03 MED ORDER — MORPHINE SULFATE (PF) 2 MG/ML IV SOLN: 2.0000 mg | INTRAVENOUS | Status: DC | PRN

## 2021-01-03 MED ORDER — EPHEDRINE 5 MG/ML INJ
INTRAVENOUS | Status: AC
  Filled 2021-01-03: qty 20

## 2021-01-03 MED ORDER — PHENYLEPHRINE 40 MCG/ML (10ML) SYRINGE FOR IV PUSH (FOR BLOOD PRESSURE SUPPORT)
PREFILLED_SYRINGE | INTRAVENOUS | Status: AC
  Filled 2021-01-03: qty 20

## 2021-01-03 MED ORDER — ROCURONIUM BROMIDE 10 MG/ML (PF) SYRINGE
PREFILLED_SYRINGE | INTRAVENOUS | Status: AC
  Filled 2021-01-03: qty 20

## 2021-01-03 MED ORDER — LACTATED RINGERS IV SOLN: INTRAVENOUS | Status: DC | PRN

## 2021-01-03 MED ORDER — SODIUM CHLORIDE 0.9% FLUSH: 3.0000 mL | INTRAVENOUS | Status: DC | PRN

## 2021-01-03 MED ORDER — KETOROLAC TROMETHAMINE 15 MG/ML IJ SOLN
15.0000 mg | Freq: Four times a day (QID) | INTRAMUSCULAR | Status: DC
  Administered 2021-01-03 – 2021-01-04 (×3): 15 mg via INTRAVENOUS
  Filled 2021-01-03 (×3): qty 1

## 2021-01-03 MED ORDER — PROPOFOL 10 MG/ML IV BOLUS
INTRAVENOUS | Status: DC | PRN
  Administered 2021-01-03: 170 mg via INTRAVENOUS

## 2021-01-03 MED ORDER — HYDROMORPHONE HCL 1 MG/ML IJ SOLN
0.2500 mg | INTRAMUSCULAR | Status: DC | PRN
  Administered 2021-01-03 (×3): 0.5 mg via INTRAVENOUS

## 2021-01-03 MED ORDER — DEXAMETHASONE SODIUM PHOSPHATE 10 MG/ML IJ SOLN
INTRAMUSCULAR | Status: AC
  Filled 2021-01-03: qty 1

## 2021-01-03 MED ORDER — METHOCARBAMOL 1000 MG/10ML IJ SOLN
500.0000 mg | Freq: Four times a day (QID) | INTRAVENOUS | Status: DC | PRN
  Administered 2021-01-11: 500 mg via INTRAVENOUS
  Filled 2021-01-03 (×3): qty 5

## 2021-01-03 MED ORDER — HYDROMORPHONE HCL 1 MG/ML IJ SOLN
INTRAMUSCULAR | Status: AC
  Filled 2021-01-03: qty 1

## 2021-01-03 MED ORDER — ACETAMINOPHEN 650 MG RE SUPP: 650.0000 mg | RECTAL | Status: DC | PRN

## 2021-01-03 MED ORDER — PROPOFOL 10 MG/ML IV BOLUS
INTRAVENOUS | Status: AC
  Filled 2021-01-03: qty 20

## 2021-01-03 MED ORDER — ROCURONIUM BROMIDE 10 MG/ML (PF) SYRINGE
PREFILLED_SYRINGE | INTRAVENOUS | Status: DC | PRN
  Administered 2021-01-03 (×3): 20 mg via INTRAVENOUS
  Administered 2021-01-03: 70 mg via INTRAVENOUS
  Administered 2021-01-03: 30 mg via INTRAVENOUS
  Administered 2021-01-03 (×3): 20 mg via INTRAVENOUS

## 2021-01-03 MED ORDER — SODIUM CHLORIDE 0.9 % IV SOLN: 250.0000 mL | INTRAVENOUS | Status: DC

## 2021-01-03 MED ORDER — LIDOCAINE-EPINEPHRINE 1 %-1:100000 IJ SOLN
INTRAMUSCULAR | Status: AC
  Filled 2021-01-03: qty 1

## 2021-01-03 MED ORDER — THROMBIN 5000 UNITS EX SOLR
OROMUCOSAL | Status: DC | PRN
  Administered 2021-01-03: 5 mL via TOPICAL

## 2021-01-03 MED ORDER — BISACODYL 10 MG RE SUPP
10.0000 mg | Freq: Every day | RECTAL | Status: DC | PRN
  Administered 2021-01-05: 10 mg via RECTAL
  Filled 2021-01-03: qty 1

## 2021-01-03 MED ORDER — FENTANYL CITRATE (PF) 100 MCG/2ML IJ SOLN: 25.0000 ug | INTRAMUSCULAR | Status: DC | PRN

## 2021-01-03 MED ORDER — DEXAMETHASONE SODIUM PHOSPHATE 10 MG/ML IJ SOLN
INTRAMUSCULAR | Status: DC | PRN
  Administered 2021-01-03: 4 mg via INTRAVENOUS

## 2021-01-03 MED ORDER — THROMBIN 5000 UNITS EX SOLR: OROMUCOSAL | Status: DC | PRN

## 2021-01-03 MED ORDER — 0.9 % SODIUM CHLORIDE (POUR BTL) OPTIME
TOPICAL | Status: DC | PRN
  Administered 2021-01-03 (×2): 1000 mL

## 2021-01-03 MED ORDER — SODIUM CHLORIDE 0.9% IV SOLUTION: Freq: Once | INTRAVENOUS | Status: DC

## 2021-01-03 MED ORDER — MIDAZOLAM HCL 2 MG/2ML IJ SOLN
INTRAMUSCULAR | Status: AC
  Filled 2021-01-03: qty 2

## 2021-01-03 MED ORDER — ONDANSETRON HCL 4 MG PO TABS: 4.0000 mg | ORAL_TABLET | Freq: Four times a day (QID) | ORAL | Status: DC | PRN

## 2021-01-03 MED ORDER — THROMBIN 5000 UNITS EX SOLR
CUTANEOUS | Status: AC
  Filled 2021-01-03: qty 10000

## 2021-01-03 MED ORDER — DAPTOMYCIN IV (FOR PTA / DISCHARGE USE ONLY): 1000.0000 mg | INTRAVENOUS | 0 refills | Status: DC

## 2021-01-03 MED ORDER — 0.9 % SODIUM CHLORIDE (POUR BTL) OPTIME
TOPICAL | Status: DC | PRN
  Administered 2021-01-03: 1000 mL

## 2021-01-03 MED ORDER — DOCUSATE SODIUM 100 MG PO CAPS
100.0000 mg | ORAL_CAPSULE | Freq: Two times a day (BID) | ORAL | Status: DC
  Administered 2021-01-04 – 2021-01-12 (×14): 100 mg via ORAL
  Filled 2021-01-03 (×14): qty 1

## 2021-01-03 MED ORDER — LACTATED RINGERS IV SOLN: INTRAVENOUS | Status: DC

## 2021-01-03 MED ORDER — MIDAZOLAM HCL 2 MG/2ML IJ SOLN
INTRAMUSCULAR | Status: DC | PRN
  Administered 2021-01-03: 1 mg via INTRAVENOUS
  Administered 2021-01-03 (×2): .5 mg via INTRAVENOUS

## 2021-01-03 MED ORDER — MENTHOL 3 MG MT LOZG
1.0000 | LOZENGE | OROMUCOSAL | Status: DC | PRN
  Filled 2021-01-03: qty 9

## 2021-01-03 MED ORDER — CEFAZOLIN SODIUM-DEXTROSE 2-4 GM/100ML-% IV SOLN
2.0000 g | Freq: Three times a day (TID) | INTRAVENOUS | Status: AC
  Administered 2021-01-04 (×2): 2 g via INTRAVENOUS
  Filled 2021-01-03 (×2): qty 100

## 2021-01-03 MED ORDER — ALUM & MAG HYDROXIDE-SIMETH 200-200-20 MG/5ML PO SUSP: 30.0000 mL | Freq: Four times a day (QID) | ORAL | Status: DC | PRN

## 2021-01-03 MED ORDER — FLEET ENEMA 7-19 GM/118ML RE ENEM: 1.0000 | ENEMA | Freq: Once | RECTAL | Status: DC | PRN

## 2021-01-03 MED ORDER — ONDANSETRON HCL 4 MG/2ML IJ SOLN
INTRAMUSCULAR | Status: DC | PRN
  Administered 2021-01-03: 4 mg via INTRAVENOUS

## 2021-01-03 MED ORDER — SUGAMMADEX SODIUM 200 MG/2ML IV SOLN
INTRAVENOUS | Status: DC | PRN
  Administered 2021-01-03: 200 mg via INTRAVENOUS

## 2021-01-03 MED ORDER — THROMBIN 5000 UNITS EX SOLR
CUTANEOUS | Status: AC
  Filled 2021-01-03: qty 5000

## 2021-01-03 MED ORDER — SENNA 8.6 MG PO TABS
1.0000 | ORAL_TABLET | Freq: Two times a day (BID) | ORAL | Status: DC
  Administered 2021-01-04 – 2021-01-06 (×6): 8.6 mg via ORAL
  Filled 2021-01-03 (×10): qty 1

## 2021-01-03 MED ORDER — SODIUM CHLORIDE 0.9% FLUSH
3.0000 mL | Freq: Two times a day (BID) | INTRAVENOUS | Status: DC
  Administered 2021-01-04 – 2021-01-09 (×12): 3 mL via INTRAVENOUS

## 2021-01-03 MED ORDER — METHOCARBAMOL 500 MG PO TABS
500.0000 mg | ORAL_TABLET | Freq: Four times a day (QID) | ORAL | Status: DC | PRN
  Administered 2021-01-04 – 2021-01-17 (×27): 500 mg via ORAL
  Filled 2021-01-03 (×30): qty 1

## 2021-01-03 MED ORDER — POLYETHYLENE GLYCOL 3350 17 G PO PACK: 17.0000 g | PACK | Freq: Every day | ORAL | Status: DC | PRN

## 2021-01-03 MED ORDER — ONDANSETRON HCL 4 MG/2ML IJ SOLN
INTRAMUSCULAR | Status: AC
  Filled 2021-01-03: qty 2

## 2021-01-03 MED ORDER — PHENYLEPHRINE HCL-NACL 10-0.9 MG/250ML-% IV SOLN
INTRAVENOUS | Status: DC | PRN
  Administered 2021-01-03: 10 ug/min via INTRAVENOUS

## 2021-01-03 SURGICAL SUPPLY — 75 items
APPLIER CLIP 11 MED OPEN (CLIP) ×2
BLADE BN FN 3.2XSTRL LF (MISCELLANEOUS) IMPLANT
BLADE BONE MILL FINE (MISCELLANEOUS) ×1
BLADE CLIPPER SURG (BLADE) ×1 IMPLANT
BUR ACORN 6.0 ACORN (BURR) ×2 IMPLANT
BUR ACORN 6.0 PRECISION (BURR) IMPLANT
CAGE XCORE 2 TI 018 35-55 (Cage) ×1 IMPLANT
CANISTER SUCT 3000ML PPV (MISCELLANEOUS) ×2 IMPLANT
CAP END 15X18 CORE X 2 40 0 (Neuro Prosthesis/Implant) IMPLANT
CAP END 2 TI LOCK SCREW X-CORE (Screw) IMPLANT
CATH THORACIC 28FR (CATHETERS) ×1 IMPLANT
CLIP APPLIE 11 MED OPEN (CLIP) IMPLANT
COVER BACK TABLE 60X90IN (DRAPES) ×2 IMPLANT
DRAPE INCISE 23X17 IOBAN STRL (DRAPES) ×1
DRAPE INCISE 23X17 STRL (DRAPES) IMPLANT
DRAPE INCISE IOBAN 23X17 STRL (DRAPES) ×1 IMPLANT
DRAPE UNIVERSAL PACK (DRAPES) ×1 IMPLANT
DRSG AQUACEL AG ADV 3.5X10 (GAUZE/BANDAGES/DRESSINGS) ×1 IMPLANT
DRSG AQUACEL AG ADV 3.5X14 (GAUZE/BANDAGES/DRESSINGS) ×1 IMPLANT
ELECT BLADE 4.0 EZ CLEAN MEGAD (MISCELLANEOUS) ×2
ELECT BLADE 6.5 EXT (BLADE) ×2 IMPLANT
ELECT REM PT RETURN 9FT ADLT (ELECTROSURGICAL) ×4
ELECTRODE BLDE 4.0 EZ CLN MEGD (MISCELLANEOUS) IMPLANT
ELECTRODE REM PT RTRN 9FT ADLT (ELECTROSURGICAL) ×1 IMPLANT
ENDCAP CORE X 2 15X18X40 0 (Neuro Prosthesis/Implant) ×2 IMPLANT
GAUZE SPONGE 4X4 12PLY STRL (GAUZE/BANDAGES/DRESSINGS) ×2 IMPLANT
GLOVE BIOGEL PI IND STRL 8.5 (GLOVE) ×1 IMPLANT
GLOVE BIOGEL PI INDICATOR 8.5 (GLOVE) ×2
GLOVE ECLIPSE 8.5 STRL (GLOVE) ×3 IMPLANT
GLOVE SURG MICRO LTX SZ7 (GLOVE) ×1 IMPLANT
GOWN STRL REUS W/ TWL LRG LVL3 (GOWN DISPOSABLE) IMPLANT
GOWN STRL REUS W/ TWL XL LVL3 (GOWN DISPOSABLE) ×1 IMPLANT
GOWN STRL REUS W/TWL 2XL LVL3 (GOWN DISPOSABLE) ×5 IMPLANT
GOWN STRL REUS W/TWL LRG LVL3 (GOWN DISPOSABLE) ×2
GOWN STRL REUS W/TWL XL LVL3 (GOWN DISPOSABLE) ×4
HEMOSTAT POWDER KIT SURGIFOAM (HEMOSTASIS) ×1 IMPLANT
KIT BASIN OR (CUSTOM PROCEDURE TRAY) ×3 IMPLANT
KIT SUCTION CATH 14FR (SUCTIONS) ×1 IMPLANT
KIT TURNOVER KIT B (KITS) ×2 IMPLANT
LOOP VESSEL MAXI BLUE (MISCELLANEOUS) ×1 IMPLANT
MARKER SKIN DUAL TIP RULER LAB (MISCELLANEOUS) ×1 IMPLANT
NDL SPNL 18GX3.5 QUINCKE PK (NEEDLE) IMPLANT
NEEDLE SPNL 18GX3.5 QUINCKE PK (NEEDLE) ×4 IMPLANT
NS IRRIG 1000ML POUR BTL (IV SOLUTION) ×2 IMPLANT
PACK CHEST (CUSTOM PROCEDURE TRAY) ×2 IMPLANT
PACK LAMINECTOMY NEURO (CUSTOM PROCEDURE TRAY) ×1 IMPLANT
PACK UNIVERSAL I (CUSTOM PROCEDURE TRAY) ×1 IMPLANT
PLATE LAT FIXED TRAVERSE 60 (Plate) ×1 IMPLANT
SCREW SET ENDCAP (Screw) ×4 IMPLANT
SCREW TRAVERSE 5.5X35MM (Screw) ×2 IMPLANT
SCREW TRAVERSE 5.5X40 (Screw) ×2 IMPLANT
SPONGE INTESTINAL PEANUT (DISPOSABLE) ×2 IMPLANT
SPONGE LAP 18X18 RF (DISPOSABLE) IMPLANT
SPONGE LAP 18X18 X RAY DECT (DISPOSABLE) ×1 IMPLANT
SPONGE LAP 4X18 RFD (DISPOSABLE) ×4 IMPLANT
SPONGE TONSIL TAPE 1 RFD (DISPOSABLE) ×1 IMPLANT
SUT PROLENE 2 0 MH 48 (SUTURE) ×3 IMPLANT
SUT SILK  1 MH (SUTURE) ×1
SUT SILK 1 MH (SUTURE) IMPLANT
SUT SILK 2 0 SH CR/8 (SUTURE) ×1 IMPLANT
SUT SILK 2 0SH CR/8 30 (SUTURE) ×1 IMPLANT
SUT VIC AB 1 CTX 36 (SUTURE) ×2
SUT VIC AB 1 CTX36XBRD ANBCTR (SUTURE) IMPLANT
SUT VIC AB 2-0 CP2 18 (SUTURE) ×1 IMPLANT
SUT VIC AB 2-0 CT1 27 (SUTURE) ×1
SUT VIC AB 2-0 CT1 TAPERPNT 27 (SUTURE) IMPLANT
SUT VIC AB 2-0 CTX 36 (SUTURE) ×3 IMPLANT
SUT VIC AB 3-0 X1 27 (SUTURE) ×2 IMPLANT
SUT VICRYL 2 TP 1 (SUTURE) ×1 IMPLANT
SYR BULB IRRIG 60ML STRL (SYRINGE) ×2 IMPLANT
TOWEL GREEN STERILE (TOWEL DISPOSABLE) ×3 IMPLANT
TOWEL GREEN STERILE FF (TOWEL DISPOSABLE) ×3 IMPLANT
TRAY FOLEY MTR SLVR 16FR STAT (SET/KITS/TRAYS/PACK) ×1 IMPLANT
WATER STERILE IRR 1000ML POUR (IV SOLUTION) ×4 IMPLANT
YANKAUER SUCT BULB TIP NO VENT (SUCTIONS) ×2 IMPLANT

## 2021-01-03 NOTE — Progress Notes (Signed)
Patient ID: Christopher Fernandez, male   DOB: [birth year], 50 y.o.   MRN: [id number]  TCTS:  I reviewed the medical record, CT scans and examined the patient. He has MRSA osteomyelitis/discitis at T9-T10. He remains neuro intact. Plan right thoracotomy for spinal exposure this afternoon with Dr. Ellene Route. I discussed thoracotomy with the patient, indications, benefits and risks and he agrees to proceed.

## 2021-01-03 NOTE — Anesthesia Procedure Notes (Signed)
Procedure Name: Intubation Date/Time: 01/03/2021 4:48 PM Performed by: Thelma Comp, CRNA Pre-anesthesia Checklist: Patient identified, Emergency Drugs available, Suction available and Patient being monitored Patient Re-evaluated:Patient Re-evaluated prior to induction Oxygen Delivery Method: Circle System Utilized Preoxygenation: Pre-oxygenation with 100% oxygen Induction Type: IV induction Ventilation: Mask ventilation without difficulty Laryngoscope Size: Mac and 3 Grade View: Grade I Tube type: Oral Endobronchial tube: Left, Double lumen EBT and EBT position confirmed by fiberoptic bronchoscope and 39 Fr Number of attempts: 1 Airway Equipment and Method: Stylet Placement Confirmation: ETT inserted through vocal cords under direct vision,  positive ETCO2 and breath sounds checked- equal and bilateral Tube secured with: Tape Dental Injury: Teeth and Oropharynx as per pre-operative assessment

## 2021-01-03 NOTE — Progress Notes (Signed)
PHARMACY CONSULT NOTE FOR:  OUTPATIENT  PARENTERAL ANTIBIOTIC THERAPY (OPAT)  Indication: T spine discitis  Regimen: Daptomycin 1000 mg every 24 hours  End date: 02/28/21  IV antibiotic discharge orders are pended. To discharging provider:  please sign these orders via discharge navigator,  Select New Orders & click on the button choice - Manage This Unsigned Work.     Thank you for allowing pharmacy to be a part of this patient's care.  Jimmy Footman, PharmD, BCPS, BCIDP Infectious Diseases Clinical Pharmacist Phone: [phone] 01/03/2021, 1:46 PM

## 2021-01-03 NOTE — Interval H&P Note (Signed)
History and Physical Interval Note:  01/03/2021 4:32 PM  Christopher Fernandez  has presented today for surgery, with the diagnosis of Osteomyelitits, Epidural abscess.  The various methods of treatment have been discussed with the patient and family. After consideration of risks, benefits and other options for treatment, the patient has consented to  Procedure(s): Right Thoracic 9-Thoracic 10 Thoracotomy (Right) THORACIC EXPOSURE (N/A) as a surgical intervention.  The patient's history has been reviewed, patient examined, no change in status, stable for surgery.  I have reviewed the patient's chart and labs.  Questions were answered to the patient's satisfaction.     [name]

## 2021-01-03 NOTE — Transfer of Care (Signed)
Immediate Anesthesia Transfer of Care Note  Patient: Christopher Fernandez  Procedure(s) Performed: Decompression of  Thoracic nine and Thoracic ten via corpectomy reconstruction with titanium spacer and rib autogrtaft. Lateral plate fixation Thoracic eight - Thoracic eleven (Right Chest) THORACIC EXPOSURE with removal of rib (Right Chest)  Patient Location: PACU  Anesthesia Type:General  Level of Consciousness: awake, alert  and patient cooperative  Airway & Oxygen Therapy: Patient Spontanous Breathing and Patient connected to face mask oxygen  Post-op Assessment: Report given to RN and Post -op Vital signs reviewed and stable  Post vital signs: Reviewed and stable  Last Vitals:  Vitals Value Taken Time  BP 126/98 01/03/21 2234  Temp    Pulse 83   Resp 17 01/03/21 2238  SpO2 100   Vitals shown include unvalidated device data.  Last Pain:  Vitals:   01/03/21 0843  TempSrc: Oral  PainSc: 7       Patients Stated Pain Goal: 2 (63/14/97 0263)  Complications: No complications documented.

## 2021-01-03 NOTE — Anesthesia Procedure Notes (Addendum)
Arterial Line Insertion Start/End4/22/2022 3:55 PM, 01/03/2021 4:00 PM Performed by: Suzette Battiest, MD, anesthesiologist  Patient location: Pre-op. Preanesthetic checklist: patient identified, IV checked, site marked, risks and benefits discussed, surgical consent, monitors and equipment checked, pre-op evaluation, timeout performed and anesthesia consent Lidocaine 1% used for infiltration Left, radial was placed Catheter size: 20 Fr Hand hygiene performed  and maximum sterile barriers used   Attempts: 1 Procedure performed without using ultrasound guided technique. Following insertion, dressing applied and Biopatch. Post procedure assessment: normal and unchanged  Patient tolerated the procedure well with no immediate complications.

## 2021-01-03 NOTE — Progress Notes (Signed)
Family Medicine Teaching Service Daily Progress Note Intern Pager: [phone]  Patient name: Christopher Fernandez Medical record number: [id number] Date of birth: May 18, 1971 Age: 50 y.o. Gender: male  Primary Care Provider: Danna Hefty, DO Consultants: ID, neurosurgery Code Status: Full   Pt Overview and Major Events to Date:  4/15: Admitted   Assessment and Plan: Christopher Fernandez a 50 y.o.malepresenting with worsening osteomyelitis and concern of new abscess/phlegmon in thoracic spine after having a repeat MRI a few days ago. PMH is significant forhistory of thoracic osteomyelitis, type 2 diabetes, peripheral neuropathy, chronic pancreatitis/pancreatic insufficiency, hyperlipidemia, hypertension, asthma, seasonal allergies, GERD.  Osteomyelitis/discitiswithnew abscess/phlegmoninthoracic spine Endorsing that pain is well controlled under current regimen. Patient with previous MRI evidence of of discitis and osteomyelitis at T9-T10 without epidural abscess at that time. Patient was seen by infectious disease yesterday and complained of worsening pain. Patient had MRI on 12/25/2020 that showed worsening progression of osteomyelitis to continuous vertebra at T8 and T11 with circumferential epidural abscess/phlegmon at T9-T10with moderate spinal stenosis at that level.Urine culture notable for1000 colonies ofdiphtheroids.Blood cultures notable for no growth for 3 days. -Neurosurgeryfollowing, appreciate continued involvement and recommendations: plan for surgical decompressiontoday -ID following, appreciate recs including continuedaptomycin  -IV daptomycin(4/15-) -tylenol 1000 mg q6h -oxycodone 5 mg q4h, consider increasing to oxy 10 given that is his home med -IMdilautid 0.5 mg q3h prnas needed for breakthrough -Lidocaine patch -Continue home baclofen -PT/OT evaluate and treat: recommended rolling walker with 5" wheels and 3 in 1 bedside commode  Osteomyelitis right  hip Patient with MRI evidence from 12/11/2010 worsening osteomyelitis of theparasymphyseal right superior pubic ramus with improvement in right abductor minimus myositis. -IDconsulted, appreciate continued involvement and recs -IV daptomycin   Moderatethoracicspinal stenosis: Most recent MRI the patient was having worsening spinal stenosis at T9-T10 in conjunction with his epidural abscess/phlegmon. This was classified as moderate on the MRI scan.  -Neurosurgery consult, plan for decompression4/22 -f/u neurosurgery recs  T2DM with neuropathy [id number] morning.Home medications include Januvia 100 mg daily, metformin 1000 mg twice daily, sliding scale insulin, 18 units insulin glargine in the mornings, Jardiance 25 mg daily.Also on gabapentin for neuropathy.Most recent A1c 9.1. -Continue home metformin -Hold Jardiance and Januvia -sSSI -Lantus 9 units  Elevated blood pressures Normotensive this morning 120/82. Takes propranolol for tremors. Likely secondary to pain as he is still complaining of pain. -pain regimen as above -amlodipine 2.5 mg  Chronic pancreatitis/pancreatic insufficiency Home medications include pancrelipase3 times per day with meals. -We will continue with pancreatic enzymes from our formulary  History of anemia  Thromobocytosis Stable.Hgb9.3. Per chart review, baseline Hgb 10-12.Improving platelet count of 528, likely secondary to underlying anemia.  -monitor CBC  Hyperlipidemia Home medications include Lipitor 40. Last cholesterol panel showsLDL of 34, HDL 34. -Continue home Lipitor  Asthma and history of seasonal allergies Home medications include albuterol as needed, Advair daily, Singulair, Zyrtec. -Continue home medications -Dulera per formulary   History of alcohol abuse/tobacco use disorder Patient states his last drink was about 1.5 years ago. Continues to smoke about half pack per day, previously smoked about a pack per  day for 35 years. -nicotine patch as desired  Anxiety Depression Home medications include duloxetine 60 mg daily, Abilify 5 mg daily. -Continue home medications  GERD Home medications include pantoprazole 40 mg twice daily -Continue home medications  History of tremors Home medications include propranolol -Continue home propranolol  Left foot recent toe amputation Occurred on 3/23. Patient without complaints of pain in that region  today. 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 -Continue to monitor  Unintentional weight loss: Weight loss of about 30-35 pounds per patient on admission.Weight stable thus far. -daily weights -encourage diet -nutrition consulted   FEN/GI: NPO since midnight pending thoracotomy  PPx: none pending surgery, then resume heparin subq   Status is: Inpatient  Remains inpatient appropriate because:Inpatient level of care appropriate due to severity of illness   Dispo:  Patient From: Home  Planned Disposition: Home with Health Care Svc  Medically stable for discharge: No          Subjective:  No significant overnight events reported. Patient states that his pain is very tolerable and is content with the current pain regimen.  Objective: Temp:  [98 F (36.7 C)-98.5 F (36.9 C)] 98.4 F (36.9 C) (04/22 0412) Pulse Rate:  [75-90] 75 (04/22 0412) Resp:  [16-17] 16 (04/22 0412) BP: (118-135)/(82-95) 120/82 (04/22 0412) SpO2:  [96 %-98 %] 98 % (04/22 0412) Physical Exam: General: Patient sitting upright in bed, in no acute distress. Cardiovascular: RRR, no murmurs or gallops auscultated  Respiratory: CTAB, no rales or rhonchi noted Abdomen: soft, nontender, nondistended, BS+ Extremities: radial and distal pulses strong and equal bilaterally, no LE edema noted bilaterally Psych: mood appropriate   Laboratory: Recent Labs  Lab 01/01/21 0623 01/02/21 0417 01/03/21 0316  WBC 8.5 9.0 8.8  HGB 9.6*  9.4* 9.3*  HCT 30.8* 29.8* 29.2*  PLT 480* 532* 528*   Recent Labs  Lab 12/27/20 0644 12/27/20 1111 01/01/21 0623 01/02/21 0417 01/03/21 0316  NA 135   < > 134* 135 132*  K 4.5   < > 4.2 4.0 4.1  CL 103   < > 101 100 96*  CO2 23   < > 25 28 28   BUN 11   < > 12 13 13   CREATININE 0.75   < > 0.70 0.72 0.70  CALCIUM 9.1   < > 8.4* 8.6* 8.7*  PROT 8.4*  --   --   --   --   BILITOT 0.6  --   --   --   --   ALKPHOS 134*  --   --   --   --   ALT 9  --   --   --   --   AST 10*  --   --   --   --   GLUCOSE 162*   < > 154* 166* 168*   < > = values in this interval not displayed.      Imaging/Diagnostic Tests: No results found.  Donney Dice, DO 01/03/2021, 5:16 AM PGY-1, [place] Intern pager: [phone], text pages welcome

## 2021-01-03 NOTE — Progress Notes (Signed)
Subjective:  Awaiting surgery when I visited him this morning  Antibiotics:  Anti-infectives (From admission, onward)   Start     Dose/Rate Route Frequency Ordered Stop   01/03/21 0000  daptomycin (CUBICIN) IVPB        1,000 mg Intravenous Every 24 hours 01/03/21 1348 02/28/21 2359   12/27/20 2000  [MAR Hold]  DAPTOmycin (CUBICIN) 1,000 mg in sodium chloride 0.9 % IVPB        (MAR Hold since Fri 01/03/2021 at 1439.Hold Reason: Transfer to a Procedural area.)   1,000 mg 140 mL/hr over 30 Minutes Intravenous Daily 12/27/20 1143        Medications: Scheduled Meds: . [MAR Hold] (feeding supplement) PROSource Plus  30 mL Oral TID BM  . sodium chloride   Intravenous Once  . [MAR Hold] acetaminophen  1,000 mg Oral Q6H  . [MAR Hold] amLODipine  2.5 mg Oral Daily  . [MAR Hold] ARIPiprazole  5 mg Oral QPM  . [MAR Hold] atorvastatin  40 mg Oral Daily  . [MAR Hold] Chlorhexidine Gluconate Cloth  6 each Topical Daily  . [MAR Hold] docusate sodium  100 mg Oral Daily  . [MAR Hold] DULoxetine  60 mg Oral Daily  . [MAR Hold] fluticasone furoate-vilanterol  1 puff Inhalation Daily  . [MAR Hold] gabapentin  600 mg Oral TID  . [MAR Hold] insulin aspart  0-9 Units Subcutaneous TID WC  . [MAR Hold] insulin glargine  9 Units Subcutaneous Daily  . [MAR Hold] lidocaine  1 patch Transdermal Q24H  . [MAR Hold] lipase/protease/amylase  36,000 Units Oral TID WC  . [MAR Hold] loratadine  10 mg Oral Daily  . [MAR Hold] metFORMIN  1,000 mg Oral BID WC  . [MAR Hold] mirtazapine  30 mg Oral QHS  . [MAR Hold] montelukast  10 mg Oral QHS  . [MAR Hold] multivitamin with minerals  1 tablet Oral Daily  . [MAR Hold] oxyCODONE  10 mg Oral Q4H  . [MAR Hold] pantoprazole  40 mg Oral BID  . [MAR Hold] polyethylene glycol  17 g Oral Daily  . [MAR Hold] propranolol ER  80 mg Oral Daily   Continuous Infusions: . [MAR Hold] DAPTOmycin (CUBICIN)  IV 1,000 mg (01/02/21 2027)   PRN Meds:.[MAR Hold] albuterol,  [MAR Hold] baclofen, [MAR Hold] bismuth subsalicylate, [MAR Hold]  HYDROmorphone (DILAUDID) injection, [MAR Hold] prochlorperazine, [MAR Hold] sodium chloride flush    Objective: Weight change:   Intake/Output Summary (Last 24 hours) at 01/03/2021 1650 Last data filed at 01/03/2021 0600 Gross per 24 hour  Intake 200 ml  Output --  Net 200 ml   Blood pressure (!) 148/100, pulse 72, temperature 98.2 F (36.8 C), temperature source Oral, resp. rate 14, height 6\' 3"  (1.905 m), weight 106.5 kg, SpO2 100 %. Temp:  [98 F (36.7 C)-98.4 F (36.9 C)] 98.2 F (36.8 C) (04/22 0843) Pulse Rate:  [70-75] 72 (04/22 1627) Resp:  [13-18] 14 (04/22 1627) BP: (118-148)/(82-100) 148/100 (04/22 1622) SpO2:  [96 %-100 %] 100 % (04/22 1627) Weight:  [106.5 kg] 106.5 kg (04/22 0600)  Physical Exam: Physical Exam Constitutional:      Appearance: He is well-developed.  HENT:     Head: Normocephalic and atraumatic.  Eyes:     Conjunctiva/sclera: Conjunctivae normal.  Cardiovascular:     Rate and Rhythm: Normal rate and regular rhythm.     Heart sounds: No murmur heard. No gallop.   Pulmonary:  Effort: Pulmonary effort is normal. No respiratory distress.     Breath sounds: Normal breath sounds. No stridor. No wheezing or rhonchi.  Abdominal:     General: Abdomen is flat. There is no distension.     Palpations: Abdomen is soft.  Musculoskeletal:     Cervical back: Normal range of motion and neck supple.  Skin:    General: Skin is warm and dry.     Findings: No erythema or rash.  Neurological:     General: No focal deficit present.     Mental Status: He is alert and oriented to person, place, and time.  Psychiatric:        Mood and Affect: Mood normal.        Behavior: Behavior normal.        Thought Content: Thought content normal.        Judgment: Judgment normal.      CBC:    BMET Recent Labs    01/02/21 0417 01/03/21 0316  NA 135 132*  K 4.0 4.1  CL 100 96*  CO2 28 28   GLUCOSE 166* 168*  BUN 13 13  CREATININE 0.72 0.70  CALCIUM 8.6* 8.7*     Liver Panel  No results for input(s): PROT, ALBUMIN, AST, ALT, ALKPHOS, BILITOT, BILIDIR, IBILI in the last 72 hours.     Sedimentation Rate No results for input(s): ESRSEDRATE in the last 72 hours. C-Reactive Protein No results for input(s): CRP in the last 72 hours.  Micro Results: Recent Results (from the past 720 hour(s))  SARS CORONAVIRUS 2 (TAT 6-24 HRS) Nasopharyngeal Nasopharyngeal Swab     Status: None   Collection Time: 12/27/20  6:16 AM   Specimen: Nasopharyngeal Swab  Result Value Ref Range Status   SARS Coronavirus 2 NEGATIVE NEGATIVE Final    Comment: (NOTE) SARS-CoV-2 target nucleic acids are NOT DETECTED.  The SARS-CoV-2 RNA is generally detectable in upper and lower respiratory specimens during the acute phase of infection. Negative results do not preclude SARS-CoV-2 infection, do not rule out co-infections with other pathogens, and should not be used as the sole basis for treatment or other patient management decisions. Negative results must be combined with clinical observations, patient history, and epidemiological information. The expected result is Negative.  Fact Sheet for Patients: SugarRoll.be  Fact Sheet for Healthcare Providers: https://www.woods-mathews.com/  This test is not yet approved or cleared by the [place] FDA and  has been authorized for detection and/or diagnosis of SARS-CoV-2 by FDA under an Emergency Use Authorization (EUA). This EUA will remain  in effect (meaning this test can be used) for the duration of the COVID-19 declaration under Se ction 564(b)(1) of the Act, 21 U.S.C. section 360bbb-3(b)(1), unless the authorization is terminated or revoked sooner.  Performed at [facility], [place] [address], [place], [place] [address]   Blood culture (routine single)     Status: None   Collection Time:  12/27/20  6:29 AM   Specimen: BLOOD LEFT HAND  Result Value Ref Range Status   Specimen Description BLOOD LEFT HAND  Final   Special Requests   Final    BOTTLES DRAWN AEROBIC AND ANAEROBIC Blood Culture results may not be optimal due to an inadequate volume of blood received in culture bottles   Culture   Final    NO GROWTH 5 DAYS Performed at [facility], [place] [address], [place], [place] [address]    Report Status 01/01/2021 FINAL  Final  Urine culture     Status: Abnormal   Collection Time: 12/27/20  9:26 AM   Specimen: In/Out Cath Urine  Result Value Ref Range Status   Specimen Description IN/OUT CATH URINE  Final   Special Requests NONE  Final   Culture (A)  Final    1,000 COLONIES/mL DIPHTHEROIDS(CORYNEBACTERIUM SPECIES) Standardized susceptibility testing for this organism is not available. Performed at [facility], [place] [address] [place], [place] [address]    Report Status 12/28/2020 FINAL  Final  Surgical pcr screen     Status: None   Collection Time: 12/30/20 10:49 PM   Specimen: Nasal Mucosa; Nasal Swab  Result Value Ref Range Status   MRSA, PCR NEGATIVE NEGATIVE Final   Staphylococcus aureus NEGATIVE NEGATIVE Final    Comment: (NOTE) The Xpert SA Assay (FDA approved for NASAL specimens in patients 68 years of age and older), is one component of a comprehensive surveillance program. It is not intended to diagnose infection nor to guide or monitor treatment. Performed at [facility], [place] [address], [place], [place] [address]     Studies/Results: MR HIP RIGHT W WO CONTRAST  Result Date: 01/03/2021 CLINICAL DATA:  Right hip pain. History discitis/osteomyelitis in the thoracic spine. EXAM: MRI OF THE RIGHT HIP WITHOUT AND WITH CONTRAST TECHNIQUE: Multiplanar, multisequence MR imaging was performed both before and after administration of intravenous contrast. CONTRAST:  72mL GADAVIST GADOBUTROL 1 MMOL/ML IV SOLN COMPARISON:  MRI 12/10/2020  FINDINGS: Both hips are normally located. Mild bilateral hip joint degenerative changes but no findings suspicious for septic arthritis or osteomyelitis. Persistent edema like signal abnormality and enhancement in the right pubic bone adjacent to the pubic symphysis. I do not see any definite findings for septic arthritis at the pubic symphysis but that would certainly be a concern. Mild inflammation/edema and enhancement in the adductor muscles bilaterally, right greater than left suggesting myositis. No significant intrapelvic abnormalities are identified. IMPRESSION: 1. Persistent edema like signal abnormality and enhancement in the right pubic bone adjacent to the pubic symphysis. I do not see any definite findings for septic arthritis at the pubic symphysis. 2. Mild inflammation/edema and enhancement in the adductor muscles bilaterally, right greater than left suggesting myositis. 3. No significant intrapelvic abnormalities. Electronically Signed   By: Marijo Sanes M.D.   On: 01/03/2021 08:19      Assessment/Plan:  INTERVAL HISTORY: MRI of pelvis was completed  Active Problems:   Osteomyelitis of second toe of left foot (HCC)   Spinal epidural abscess    Christopher Fernandez is a 50 y.o. 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 where he had second toe amputation, known thoracic spine discitis and osteomyelitis of the pubic ramus, on daptomycin with worsening back pain now with MRI findings showing worsening discitis vertebral osteomyelitis and progression of a circumferential epidural abscess at T9-T10 with worsening spinal cord stenosis along with para spinal phlegmonous changes   #1  Metastatic MRSA infection with progression in his T-spine and worsening spinal stenosis:  T spine pathology:  Greatly appreciate neurosurgery's taking him to the operating room Friday with Neurosurgery and CT surgery with a T9-T10  thoracotomy .  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 Juleen China will follow up on the culture data this weekend and is available for questions.  Dr. Gale Journey or Dr. West Bali will take over service on Monday.    LOS: 6 days   Alcide Evener 01/03/2021, 4:50 PM

## 2021-01-03 NOTE — Op Note (Signed)
Date of surgery: 01/03/2021 Preoperative diagnosis: T9-T10 osteomyelitis and epidural abscess formation with myelopathy Postoperative diagnosis: Same Procedure: Right-sided T7 thoracotomy with vertebrectomy of T9 and T10 for osteomyelitis decompression of epidural abscess reconstruction with Exe-Cort titanium implant and lateral plate fixation with traverse plate local autograft from T7 rib. Surgeon: Kristeen Miss First Assistant: Elwin Sleight, DO Approach and closure:BRyan Cyndia Bent, MD. Anesthesia: General endotracheal Indications: Christopher Fernandez is a 50 year old individual who has had a number of infections in the bone in his feet secondary to diabetes.  He has had a number of amputations of his toes on either feet.  He had developed osteomyelitis and discitis at the T9-T10 level and was treated for 6 weeks with a course of antibiotics.  He developed increasing back pain was found to have developed an epidural abscess in addition to florid osteomyelitis of the vertebrae.  He had not been febrile.  Because of the deformity that he was developing and the cord compression that was ensuing he was advised regarding the need for surgical decompression via a thoracotomy with decompression of the vertebrae involved and reconstruction.  He is admitted for that now.  Procedure: After Dr. Pamalee Leyden performed a right-sided thoracotomy with resection of the T7 rib and exposure of the T9-T10 space I opened the parietal pleura overlying the vertebral body in a T-shaped fashion.  By dissecting the parietal pleura down I encountered significant edematous tissue overlying the vertebral body to ascertain the exact positioning I obtain some localizing radiographs.  These verified the position of the T9-T10 interspace and ultimately verified the T8-T9 disc space and the T10-T11 disc space.  Once these were isolated were able to dissect through the very edematous tissue overlying the space and entered the osteomyelitic vertebrae  and disc space which was largely destroyed.  Gelatinous material was removed from this area and this was all sent for specimen and cultures from this area were obtained.  The area was debrided carefully until the region of the epidural space was obtained and then carefully by dissecting behind the vertebrae remove significant epidural material that was consistent with chronic abscess formation.  There is minimal evidence of gross pus but the gelatinous material was very hyperemic and very stuck to the surrounding fascia.  The dura was ultimately identified and dissected free and clear for the most of this material but it was noted to be densely adherent to much of the epidural material.  With this we are able to then complete the vertebrectomy of T9 and T10.  Once the vertebrectomy was completed the endplates were shaved smoothed at T11 and T8.  A reconstruction cage made of 18 x 40 mm endplates was attached to an ex core 18 mm diameter cage that expanded from 42 to 62 mm.  The cage was then tested fitted and ultimately expanded into the interval to approximately 55 mm.  The fit was good and snug and checked radiographically and was noted to fit orthogonally into the vertebral space.  It was then locked into position.  A lateral plate measuring 60 mm in length was then placed over the lateral aspect of the vertebral bodies and secured with two 5.5 x 35 mm screws in T8 and a 5.5 x 40 mm screws and T11.  Radiographic confirmation of the positioning was then obtained in the AP and lateral projections.  Next the T7 rib which was harvested have been decorticated and was grounded in the bone mill.  The bone shavings were packed around the  cage and around the reconstructed vertebral segments.  Then the parietal pleura which had been dissected earlier was reflected to its original position and tied loosely with 2-0 Vicryl sutures.  At this point Dr. Caffie Pinto return to the operating room to perform closure of the thoracotomy.   Final radiographs demonstrated good reconstruction of the height of the interbody space and visually we confirmed good decompression of the epidural space.  Blood loss for this portion of the procedure was 1200 cc with 450 cc of Cell Saver blood being returned to the patient.  During this case Dr. Reatha Armour helped considerably by providing retraction and suctioning and irrigation while I drilled a portion of the corpectomy and then we reversed rolls to the speed completion of the entire process.

## 2021-01-03 NOTE — Anesthesia Preprocedure Evaluation (Signed)
Anesthesia Evaluation  Patient identified by MRN, date of birth, ID band Patient awake    Reviewed: Allergy & Precautions, NPO status , Patient's Chart, lab work & pertinent test results  Airway Mallampati: II  TM Distance: >3 FB Neck ROM: Full    Dental  (+) Dental Advisory Given   Pulmonary asthma , COPD, Current Smoker and Patient abstained from smoking.,    breath sounds clear to auscultation       Cardiovascular hypertension, Pt. on medications and Pt. on home beta blockers  Rhythm:Regular Rate:Normal     Neuro/Psych  Neuromuscular disease    GI/Hepatic Neg liver ROS, hiatal hernia, GERD  ,  Endo/Other  diabetes, Type 2, Insulin Dependent  Renal/GU negative Renal ROS     Musculoskeletal  (+) Arthritis ,   Abdominal   Peds  Hematology  (+) anemia ,   Anesthesia Other Findings   Reproductive/Obstetrics                             Lab Results  Component Value Date   WBC 8.8 01/03/2021   HGB 9.3 (L) 01/03/2021   HCT 29.2 (L) 01/03/2021   MCV 87.2 01/03/2021   PLT 528 (H) 01/03/2021   Lab Results  Component Value Date   CREATININE 0.70 01/03/2021   BUN 13 01/03/2021   NA 132 (L) 01/03/2021   K 4.1 01/03/2021   CL 96 (L) 01/03/2021   CO2 28 01/03/2021    Anesthesia Physical Anesthesia Plan  ASA: IV  Anesthesia Plan: General   Post-op Pain Management:    Induction: Intravenous  PONV Risk Score and Plan: 2 and Dexamethasone, Ondansetron and Treatment may vary due to age or medical condition  Airway Management Planned: Double Lumen EBT  Additional Equipment: Arterial line, CVP and Ultrasound Guidance Line Placement  Intra-op Plan:   Post-operative Plan: Extubation in OR and Possible Post-op intubation/ventilation  Informed Consent: I have reviewed the patients History and Physical, chart, labs and discussed the procedure including the risks, benefits and alternatives  for the proposed anesthesia with the patient or authorized representative who has indicated his/her understanding and acceptance.     Dental advisory given  Plan Discussed with: CRNA  Anesthesia Plan Comments:         Anesthesia Quick Evaluation

## 2021-01-03 NOTE — Anesthesia Procedure Notes (Signed)
Central Venous Catheter Insertion Performed by: Suzette Battiest, MD, anesthesiologist Start/End4/22/2022 4:00 PM, 01/03/2021 4:15 PM Patient location: Pre-op. Preanesthetic checklist: patient identified, IV checked, site marked, risks and benefits discussed, surgical consent, monitors and equipment checked, pre-op evaluation, timeout performed and anesthesia consent Position: Trendelenburg Lidocaine 1% used for infiltration and patient sedated Hand hygiene performed , maximum sterile barriers used  and Seldinger technique used Catheter size: 8 Fr Total catheter length 16. Central line was placed.Double lumen Procedure performed using ultrasound guided technique. Ultrasound Notes:anatomy identified, needle tip was noted to be adjacent to the nerve/plexus identified, no ultrasound evidence of intravascular and/or intraneural injection and image(s) printed for medical record Attempts: 1 Following insertion, dressing applied, line sutured and Biopatch. Post procedure assessment: blood return through all ports  Patient tolerated the procedure well with no immediate complications.

## 2021-01-03 NOTE — Progress Notes (Addendum)
Informed pt not to drink or eat anything, in preparation for the planned operation tomorrow- pt agreed , removed all cup and bottled water on the table

## 2021-01-04 ENCOUNTER — Inpatient Hospital Stay (HOSPITAL_COMMUNITY): Payer: 59

## 2021-01-04 DIAGNOSIS — M869 Osteomyelitis, unspecified: Secondary | ICD-10-CM | POA: Diagnosis not present

## 2021-01-04 DIAGNOSIS — Z9889 Other specified postprocedural states: Secondary | ICD-10-CM

## 2021-01-04 DIAGNOSIS — G061 Intraspinal abscess and granuloma: Secondary | ICD-10-CM | POA: Diagnosis not present

## 2021-01-04 LAB — BASIC METABOLIC PANEL
Anion gap: 11 (ref 5–15)
BUN: 20 mg/dL (ref 6–20)
CO2: 23 mmol/L (ref 22–32)
Calcium: 8.1 mg/dL — ABNORMAL LOW (ref 8.9–10.3)
Chloride: 96 mmol/L — ABNORMAL LOW (ref 98–111)
Creatinine, Ser: 0.92 mg/dL (ref 0.61–1.24)
GFR, Estimated: >60 mL/min (ref >60)
Glucose, Bld: 208 mg/dL — ABNORMAL HIGH (ref 70–99)
Potassium: 4.2 mmol/L (ref 3.5–5.1)
Sodium: 130 mmol/L — ABNORMAL LOW (ref 135–145)

## 2021-01-04 LAB — CBC
HCT: 32.2 % — ABNORMAL LOW (ref 39.0–52.0)
HCT: 16.6 % — ABNORMAL LOW (ref 39.0–52.0)
Hemoglobin: 10.4 g/dL — ABNORMAL LOW (ref 13.0–17.0)
Hemoglobin: 5.2 g/dL — CL (ref 13.0–17.0)
MCH: 28 pg (ref 26.0–34.0)
MCH: 28.1 pg (ref 26.0–34.0)
MCHC: 32.3 g/dL (ref 30.0–36.0)
MCHC: 31.3 g/dL (ref 30.0–36.0)
MCV: 86.6 fL (ref 80.0–100.0)
MCV: 89.7 fL (ref 80.0–100.0)
Platelets: 422 10*3/uL — ABNORMAL HIGH (ref 150–400)
Platelets: 199 10*3/uL (ref 150–400)
RBC: 3.72 MIL/uL — ABNORMAL LOW (ref 4.22–5.81)
RBC: 1.85 MIL/uL — ABNORMAL LOW (ref 4.22–5.81)
RDW: 14.3 % (ref 11.5–15.5)
RDW: 14.6 % (ref 11.5–15.5)
WBC: 12.2 10*3/uL — ABNORMAL HIGH (ref 4.0–10.5)
WBC: 6.6 10*3/uL (ref 4.0–10.5)
nRBC: 0 % (ref 0.0–0.2)
nRBC: 0 % (ref 0.0–0.2)

## 2021-01-04 LAB — CK: Total CK: 565 U/L — ABNORMAL HIGH (ref 49–397)

## 2021-01-04 LAB — GLUCOSE, CAPILLARY
Glucose-Capillary: 212 mg/dL — ABNORMAL HIGH (ref 70–99)
Glucose-Capillary: 192 mg/dL — ABNORMAL HIGH (ref 70–99)
Glucose-Capillary: 284 mg/dL — ABNORMAL HIGH (ref 70–99)
Glucose-Capillary: 247 mg/dL — ABNORMAL HIGH (ref 70–99)
Glucose-Capillary: 228 mg/dL — ABNORMAL HIGH (ref 70–99)
Glucose-Capillary: 227 mg/dL — ABNORMAL HIGH (ref 70–99)

## 2021-01-04 LAB — CREATININE, SERUM
Creatinine, Ser: 0.87 mg/dL (ref 0.61–1.24)
GFR, Estimated: >60 mL/min (ref >60)

## 2021-01-04 LAB — PREPARE RBC (CROSSMATCH)

## 2021-01-04 MED ORDER — INSULIN ASPART 100 UNIT/ML ~~LOC~~ SOLN
0.0000 [IU] | Freq: Three times a day (TID) | SUBCUTANEOUS | Status: DC
  Administered 2021-01-04: 8 [IU] via SUBCUTANEOUS
  Administered 2021-01-04: 5 [IU] via SUBCUTANEOUS
  Administered 2021-01-05: 2 [IU] via SUBCUTANEOUS
  Administered 2021-01-05 (×2): 8 [IU] via SUBCUTANEOUS
  Administered 2021-01-06: 3 [IU] via SUBCUTANEOUS
  Administered 2021-01-06: 5 [IU] via SUBCUTANEOUS
  Administered 2021-01-06: 8 [IU] via SUBCUTANEOUS
  Administered 2021-01-07 (×2): 3 [IU] via SUBCUTANEOUS

## 2021-01-04 MED ORDER — HYDROMORPHONE HCL 1 MG/ML IJ SOLN
0.5000 mg | INTRAMUSCULAR | Status: DC | PRN
Start: 2021-01-04 — End: 2021-01-10
  Administered 2021-01-04 – 2021-01-09 (×31): 0.5 mg via INTRAVENOUS
  Filled 2021-01-04 (×31): qty 1

## 2021-01-04 MED ORDER — INSULIN GLARGINE 100 UNIT/ML ~~LOC~~ SOLN
10.0000 [IU] | Freq: Every day | SUBCUTANEOUS | Status: DC
  Administered 2021-01-04: 10 [IU] via SUBCUTANEOUS
  Filled 2021-01-04 (×2): qty 0.1

## 2021-01-04 MED ORDER — SODIUM CHLORIDE 0.9 % IV BOLUS
500.0000 mL | Freq: Once | INTRAVENOUS | Status: AC
  Administered 2021-01-04: 500 mL via INTRAVENOUS

## 2021-01-04 MED ORDER — SODIUM CHLORIDE 0.9% IV SOLUTION: Freq: Once | INTRAVENOUS | Status: DC

## 2021-01-04 MED ORDER — SODIUM CHLORIDE 0.9 % IV SOLN: INTRAVENOUS | Status: AC

## 2021-01-04 MED ORDER — MELOXICAM 7.5 MG PO TABS
15.0000 mg | ORAL_TABLET | Freq: Every day | ORAL | Status: DC
  Administered 2021-01-05 – 2021-01-09 (×5): 15 mg via ORAL
  Filled 2021-01-04 (×7): qty 2

## 2021-01-04 MED ORDER — DICLOFENAC SODIUM 75 MG PO TBEC
75.0000 mg | DELAYED_RELEASE_TABLET | Freq: Two times a day (BID) | ORAL | Status: DC
  Filled 2021-01-04 (×2): qty 1

## 2021-01-04 NOTE — Progress Notes (Signed)
Family Medicine Teaching Service Daily Progress Note Intern Pager: [phone]  Patient name: Christopher Fernandez Medical record number: [id number] Date of birth: 09-Jun-1971 Age: 50 y.o. Gender: male  Primary Care Provider: Danna Hefty, DO Consultants: ID, neurosurgery Code Status: Full   Pt Overview and Major Events to Date:  4/15: Admitted  4/22: Vertebrectomy, osteomyelitis decompression of epidural abscess  Assessment and Plan: SYLER NORCIA a 50 y.o.malepresenting with worsening osteomyelitis and concern of new abscess/phlegmon in thoracic spine after having a repeat MRI a few days ago. PMH is significant forhistory of thoracic osteomyelitis, type 2 diabetes, peripheral neuropathy, chronic pancreatitis/pancreatic insufficiency, hyperlipidemia, hypertension, asthma, seasonal allergies, GERD.  Osteomyelitis/discitiswithnew abscess/phlegmoninthoracic spine, POD 1 Surgical Decompression Endorsing that pain is well controlled under current regimen. MRI on 12/25/2020 showed worsening progression of osteomyelitis to adjacent vertebra at T8 and T11 with circumferential epidural abscess/phlegmon at T9-T10with moderate spinal stenosis at that level.Urine culture notable for1000 colonies ofdiphtheroids.Blood cultures notable for no growth for 5 days. -Neurosurgeryfollowing, appreciate continued involvement and recommendations -ID following, appreciate recs including continuedaptomycin  -IV daptomycin(4/15-) -Pain regimen: tylenol 1000 mg q6h, Oxycodone 10 mg q4h, IMdilautid 0.5 mg q3h prn breakthrough pain, duloxetine 60 mg daily, gabapentin 600 mg 3 times daily, Lidocaine patch -Stop home baclofen, start Robaxin 500 mg every 6 hours as needed for muscle spasm (patient would like something that can help him sleep) -PT/OT evaluate and treat: recommended rolling walker with 5" wheels and 3 in 1 bedside commode  Osteomyelitis right hip, stable Patient with MRI evidence from  12/11/2010 worsening osteomyelitis of theparasymphyseal right superior pubic ramus with improvement in right abductor minimus myositis. -IDconsulted, appreciate continued involvement and recs -IV daptomycin   Moderatethoracicspinal stenosis, POD#1 Spinal Decompression Most recent MRI the patient was having worsening spinal stenosis at T9-T10 in conjunction with his epidural abscess/phlegmon. This was classified as moderate on the MRI scan.  -Neurosurgery consulted, appreciate recs and guidance  T2DM with neuropathy CBG134this morning.Home medications include Januvia 100 mg daily, metformin 1000 mg twice daily, sliding scale insulin, 18 units insulin glargine in the mornings, Jardiance 25 mg daily.Also on gabapentin for neuropathy.Most recent A1c 9.1. -Continue home metformin 1000mg  BID -Hold Jardiance and Januvia -mSSI, CBG check TID -Lantus 10 units -Continue Gabapentin 600mg  TID for Neuropathies  Elevated blood pressures, resolved Normotensive. Takes propranolol for tremors. Likely secondary to pain as he is still complaining of pain. -pain regimen as above -amlodipine 2.5 mg  Chronic pancreatitis/pancreatic insufficiency Home medications include pancrelipase3 times per day with meals. -We will continue with pancreatic enzymes from our formulary  History of anemia  Thromobocytosis Stable.Hgb9.3. Per chart review, baseline Hgb 10-12.Improving platelet count of 528, likely secondary to underlying anemia.  -monitor CBC  Hyperlipidemia Home medications include Lipitor 40. Last cholesterol panel showsLDL of 34, HDL 34. -Continue home Lipitor  Asthma and history of seasonal allergies Home medications include albuterol as needed, Advair daily, Singulair, Zyrtec. -Continue home medications -Dulera per formulary   History of alcohol abuse/tobacco use disorder Patient states his last drink was about 1.5 years ago. Continues to smoke about half pack per day,  previously smoked about a pack per day for 35 years. -nicotine patch as desired  Anxiety Depression Home medications include duloxetine 60 mg daily, Abilify 5 mg daily. -Continue home medications  GERD Home medications include pantoprazole 40 mg twice daily -Continue home medications  History of tremors Home medications include propranolol -Continue home propranolol  Left foot recent toe amputation Occurred on 3/23. Patient without  complaints of pain in that region today. 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 -Continue to monitor  Unintentional weight loss: Weight loss of about 30-35 pounds per patient on admission.Weight stable thus far. -daily weights -encourage diet -nutrition consulted   FEN/GI: carb modified diet PPx: Resume heparin subq   Status is: Inpatient  Remains inpatient appropriate because:Inpatient level of care appropriate due to severity of illness  Dispo:  Patient From: Home  Planned Disposition: Home with Health Care Svc  Medically stable for discharge: No    Subjective:  Patient resting comfortably in bed.  States he feels better since his surgery yesterday.  Reports he has soreness in his back that is different from the pressure/fullness of pain in his back before the procedure.  Objective: Temp:  [97.3 F (36.3 C)-98.5 F (36.9 C)] 97.3 F (36.3 C) (04/23 0400) Pulse Rate:  [70-91] 85 (04/23 0500) Resp:  [13-21] 15 (04/23 0500) BP: (107-148)/(78-100) 110/83 (04/23 0500) SpO2:  [94 %-100 %] 96 % (04/23 0500) Arterial Line BP: (120-127)/(74-83) 126/74 (04/22 2315) Weight:  [106.5 kg] 106.5 kg (04/22 0600) Physical Exam: General: Lying at 30 degrees upright in bed, in no acute distress. Cardiovascular: RRR, no murmurs or gallops auscultated  Respiratory: CTAB, comfortable work of breathing on room air Abdomen: soft, nontender, nondistended, BS+ Extremities: radial and distal pulses strong and equal  bilaterally, trace LE edema noted bilaterally; sensation intact to bilateral lower extremities Psych: mood appropriate   Laboratory: Recent Labs  Lab 01/01/21 0623 01/02/21 0417 01/03/21 0316 01/03/21 1856 01/03/21 2115  WBC 8.5 9.0 8.8  --   --   HGB 9.6* 9.4* 9.3* 11.2* 12.2*  HCT 30.8* 29.8* 29.2* 33.0* 36.0*  PLT 480* 532* 528*  --   --    Recent Labs  Lab 01/01/21 0623 01/02/21 0417 01/03/21 0316 01/03/21 1856 01/03/21 2115  NA 134* 135 132* 133* 131*  K 4.2 4.0 4.1 4.5 6.0*  CL 101 100 96*  --   --   CO2 25 28 28   --   --   BUN 12 13 13   --   --   CREATININE 0.70 0.72 0.70  --   --   CALCIUM 8.4* 8.6* 8.7*  --   --   GLUCOSE 154* 166* 168*  --   --     Imaging/Diagnostic Tests: DG CHEST PORT 1 VIEW  Result Date: 01/03/2021 CLINICAL DATA:  Status post thoracotomy. EXAM: PORTABLE CHEST 1 VIEW COMPARISON:  Preoperative radiograph 12/27/2020 FINDINGS: Right chest tube in place tip directed towards the apex. No visualized pneumothorax. Right internal jugular central line and right upper extremity PICC tip projects over the SVC. Lower thoracic surgery with lateral plate and screw fixation in corpectomy. Heart is normal in size. No pulmonary edema. Linear atelectasis at the left lung base. Small amount of soft tissue gas in the right lateral chest wall. IMPRESSION: 1. Right chest tube in place. No visualized pneumothorax. Minimal subcutaneous emphysema in the right lateral chest wall. 2. Right internal jugular central line and right upper extremity PICC with tip projecting over the SVC. 3. Subsegmental left basilar atelectasis Electronically Signed   By: Keith Rake M.D.   On: 01/03/2021 22:47   DG Chest Portable 1 View  Result Date: 01/03/2021 CLINICAL DATA:  ALIF surgery EXAM: PORTABLE CHEST 1 VIEW COMPARISON:  Chest x-ray 12/27/2020 FINDINGS: Limited portable cross-table lateral views of the lower chest. These are very limited by positioning and soft tissue artifact.  There  is surgical plate, fixating screws, and presumed spacing device within the thoracic spine between the cranial and caudal screws. Partially visualized vascular catheter. No unexpected metallic density foreign bodies are seen. IMPRESSION: Limited portable cross-table lateral views demonstrate no unexpected metallic density foreign bodies allowing for limitations of soft tissue artifact and positioning. Fixating plate, screws and presumed spacer device within the spine. Critical Value/emergent results were called by telephone at the time of interpretation on 01/03/2021 at 10:35 pm to provider Jaimie in [place] [facility] , who verbally acknowledged these results. Electronically Signed   By: Donavan Foil M.D.   On: 01/03/2021 22:37    Daisy Floro, DO 01/04/2021, 5:06 AM PGY-3, [place] Intern pager: [phone], text pages welcome

## 2021-01-04 NOTE — Progress Notes (Addendum)
Patient hypotensive and slightly tachy. Pain meds given ATC and PRN as ordered. Bolus order received from Dr. Nori Riis and infusing.  Patient remains hypotensive at this time. Complains of feeling "weaker". No change in neuro assessment. No change in previous edematous area to chest tube site, no other swelling noted elsewhere.  Chest tube draining serosang/bloody drainage. STAT H&H order received from family medicine resident.

## 2021-01-04 NOTE — Progress Notes (Signed)
FPTS Interim Progress Note  S: Received notification from attending physician regarding call from RN that patient was having hypotensive blood pressure readings with a systolic range in the 62X. Reported to bedside in order to evaluate patient.  Patient resting comfortably in the attempting to take a nap.  Patient easily awakened to voice.  Patient states that pain medications are "taking the edge off".  He reports noticing a tingling sensation in his second through fifth digits of the right hand that lasted from 2:30 PM for few hours.  He reports that this tingling sensation has since completely resolved. Patient denies difficulty breathing, lightheadedness, dizziness, visual disturbances at this time that blood pressure measurements were noted.  O: BP 94/61   Pulse (!) 101   Temp 99 F (37.2 C) (Oral)   Resp (!) 21   Ht 6\' 3"  (1.905 m)   Wt 106.5 kg   SpO2 96%   BMI 29.35 kg/m   General: Male appearing stated age in no acute distress, lying supine in bed Cardio: Rhythm is regular. No murmurs or rubs.  Bilateral radial pulses palpable Pulm: Normal respiratory effort, stable on RA Extremities: Right hand with normal tone, sensation intact throughout all phalanges, palmar and dorsal surfaces, no erythema or edema noted, strength 5 out of 5 for grip as well as interosseous muscle testing Neuro: pt alert and oriented x4, responds to questions appropriately  A/P: Hand paresthesias, right: As symptoms have resolved, believe this is likely related to patient's recent surgery.  Advised patient to notify RN if symptoms start again.  Patient agreement with this plan.  Recent hypotension: Patient blood pressure improved from 80 systolic to 52W systolic while at bedside.  Patient currently asymptomatic without any dizziness or lightheadedness, no difficulty breathing.  - Patient to received 500 mL NS fluid bolus. -Continue to monitor vitals -RN to notify primary team if blood pressures become  hypotensive again    Eulis Foster, MD 01/04/2021, 5:15 PM PGY-2, [place] Medicine Service pager [phone]

## 2021-01-04 NOTE — Progress Notes (Signed)
CALL PAGER [phone] for any questions or notifications regarding this patient  FMTS Attending Note: [name] MD I agree wit the assessment and plan as documented in the resident's note. Patient had some hypotension and tachycardia around 5;30 and we ordered a fluid bolus.Dr. Alba Cory evaluated him and he seemed comfortable. She ordered CBC which resulted with hemoglobin of 5.2 we have ordered 2 u PRBC for type cross and transfusion. Surgery also notified and they have ordered CXR. We will follow closely tonight.

## 2021-01-04 NOTE — Progress Notes (Signed)
Patient complaining of increased pain 8/10 to back and flank area at chest tube site. Area assessed and noted with more edema than earlier this AM. Patient also with more CT drainage (approx 200cc serosang at this time) compared to overnight output. Pain regimen as ordered with fair relief. VSS, ice applied to site with noted relief from patient. Dr. Reatha Armour and Dr. Cyndia Bent aware. Edematous area marked, will monitor.

## 2021-01-04 NOTE — Progress Notes (Signed)
Date and time results received: 01/04/21   Test: Hgb Critical Value: 5.2  Name of Provider Notified: Dr. Shelva Majestic- family medicine resident and Dr. Cyndia Bent- cardiothoracics  Orders Received? Or Actions Taken?: Blood orders placed. CXR ordered.

## 2021-01-04 NOTE — Progress Notes (Signed)
   Providing Compassionate, Quality Care - Together  NEUROSURGERY PROGRESS NOTE   S: No issues overnight. Doing well, feels well  O: EXAM:  BP 92/81   Pulse 87   Temp (!) 97.3 F (36.3 C) (Oral)   Resp 13   Ht 6\' 3"  (1.905 m)   Wt 106.5 kg   SpO2 94%   BMI 29.35 kg/m   Awake, alert, oriented  Speech fluent, appropriate  CNs grossly intact  Moving all extremities equally Incision clean dry and intact Chest tube in place  ASSESSMENT:  50 y.o. male with   1.  T9-10 osteodiscitis  -Status post T8-11 lateral fusion, T9-10 corpectomy  PLAN: -Continue supportive care -PT/OT -Pain control    Thank you for allowing me to participate in this patient's care.  Please do not hesitate to call with questions or concerns.   Elwin Sleight, [facility] Cell: [phone]

## 2021-01-04 NOTE — Progress Notes (Signed)
FPTS Interim Progress Note  S:Went to assess patient for hypotension.  Reports shortness of breath and now requiring oxygen. Worse when moving around.  Endorses pain and more swelling at surgical site.  Endorses more weakness in right arm after surgery.  Denies any chest pain, abdominal pain lower swelling edema.  O: BP (!) 88/60   Pulse (!) 102   Temp 98.3 F (36.8 C) (Oral)   Resp 20   Ht 6\' 3"  (1.905 m)   Wt 106.5 kg   SpO2 95%   BMI 29.35 kg/m    Physical Exam:  General: 50 y.o. male in NAD Cardio: RRR no m/r/g Lungs: Mild exp wheezing, no rhonchi, no crackles, no IWOB.  Abdomen: Soft, non-tender to palpation, non-distended, positive bowel sounds Skin: warm and dry, Right surgical incision edema noted, no hematoma appreciated, Right chest tube insertion area dry and intact. Extremities: No edema, pulses present   A/P: Hypotension, cannot rule out bleed Continues to be hypotensive with MAP in 60's.  He is alert and able to have conversation.  Received N/S bolus 500 cc. No obvious signs of bleeding -Consider CT spine/chest  -Call out to neurosurgery for recommendations  Symptomatic Anemia Continues to have shortness of breath requiring supplemental oxygen.  Last Hbg 5.2.  Receiving unit 1/2u PRBC's.  Right chest tube in place, no air leak, no frank blood noted.  Dressing intact, no obvious signs of hematoma at surgical incision. No obvious sources of bleeding.  EBL in surgery yesterday 1200cc and received 1uPRBC's -Continue transfusion -Recheck H&H post transfusion -Monitor resp status,  -Wells score 3%, moderate risk for PE, Consider CTA chest  -Consider CT spine if worsening surgical edema and no improvement in Hbg post transfusion   Carollee Leitz, MD 01/04/2021, 8:56 PM PGY-2, [place] Medicine Service pager [phone]

## 2021-01-04 NOTE — Progress Notes (Signed)
FPTS Interim Progress Note  Spoke to Neurosurgery and TCTS regarding patient and change in status over the course of the day. Will order CT chest, abdomen, pelvis to further evaluate for possible bleed given significant decline in hgb. Discussed with radiology who felt adequate visualization of t-spine with above imaging. Will give additional unit of blood (3 units total). Spoke to wife and provided update.  No urine output since foley catheter removed at 1300 today. Bladder scan noted 97cc at 2200. Kidney function this AM was normal. - patient receiving fluids with blood - will also give mIVF over 8 hours  - BMP in AM to monitor kidney function  Danna Hefty, DO 01/04/2021, 10:30 PM PGY-3, [place] Medicine Service pager [phone]

## 2021-01-04 NOTE — Progress Notes (Signed)
1 Day Post-Op Procedure(s) (LRB): Decompression of  Thoracic nine and Thoracic ten via corpectomy reconstruction with titanium spacer and rib autogrtaft. Lateral plate fixation Thoracic eight - Thoracic eleven (Right) THORACIC EXPOSURE with removal of rib (Right) Subjective:  Pain under control. Breathing feels good.  Objective: Vital signs in last 24 hours: Temp:  [97.3 F (36.3 C)-98.5 F (36.9 C)] 97.3 F (36.3 C) (04/23 0400) Pulse Rate:  [70-91] 87 (04/23 0823) Cardiac Rhythm: Normal sinus rhythm (04/22 2338) Resp:  [11-21] 13 (04/23 0823) BP: (92-148)/(74-100) 92/81 (04/23 0813) SpO2:  [92 %-100 %] 94 % (04/23 0823) Arterial Line BP: (120-127)/(74-83) 126/74 (04/22 2315) Weight:  [106.5 kg] 106.5 kg (04/23 0500)  Hemodynamic parameters for last 24 hours:    Intake/Output from previous day: 04/22 0701 - 04/23 0700 In: 3406.3 [I.V.:2211.3; Blood:745; IV Piggyback:450] Out: 5809 [Urine:900; Blood:1200; Chest Tube:100] Intake/Output this shift: No intake/output data recorded.  General appearance: alert and cooperative Heart: regular rate and rhythm Lungs: clear to auscultation bilaterally Wound: dressing dry chest tube output low, serosanguinous. no air leak.  Lab Results: Recent Labs    01/03/21 0316 01/03/21 1856 01/03/21 2115 01/04/21 0505  WBC 8.8  --   --  12.2*  HGB 9.3*   < > 12.2* 10.4*  HCT 29.2*   < > 36.0* 32.2*  PLT 528*  --   --  422*   < > = values in this interval not displayed.   BMET:  Recent Labs    01/03/21 0316 01/03/21 1856 01/03/21 2115 01/04/21 0505  NA 132*   < > 131* 130*  K 4.1   < > 6.0* 4.2  CL 96*  --   --  96*  CO2 28  --   --  23  GLUCOSE 168*  --   --  208*  BUN 13  --   --  20  CREATININE 0.70  --   --  0.92  0.87  CALCIUM 8.7*  --   --  8.1*   < > = values in this interval not displayed.    PT/INR: No results for input(s): LABPROT, INR in the last 72 hours. ABG    Component Value Date/Time   PHART 7.290 (L)  01/03/2021 2115   HCO3 29.8 (H) 01/03/2021 2115   TCO2 32 01/03/2021 2115   O2SAT 100.0 01/03/2021 2115   CBG (last 3)  Recent Labs    01/03/21 2355 01/04/21 0342 01/04/21 0716  GLUCAP 241* 212* 192*   CXR postop clear. Will repeat in am.  Assessment/Plan: S/P Procedure(s) (LRB): Decompression of  Thoracic nine and Thoracic ten via corpectomy reconstruction with titanium spacer and rib autogrtaft. Lateral plate fixation Thoracic eight - Thoracic eleven (Right) THORACIC EXPOSURE with removal of rib (Right)  POD 1  Hemodynamically stable Continue chest tube to suction today and will plan to remove tomorrow. CXR in am. He needs to work on incentive spirometry to prevent atelectasis. Ordered.  LOS: 7 days    Gaye Pollack 01/04/2021

## 2021-01-04 NOTE — Evaluation (Signed)
Physical Therapy Re-Evaluation Patient Details Name: Christopher Fernandez MRN: [id number] DOB: 11-03-1970 Today's Date: 01/04/2021   History of Present Illness  Christopher Fernandez is a 50 y.o. male presenting with worsening osteomyelitis and concern of new abscess/phlegmon in thoracic spine after having a repeat MRI a few days ago. MRI evidence from 12/11/2010 worsening osteomyelitis of the parasymphyseal right superior pubic ramus with improvement in right abductor minimus myositis.s/p right-sided T7 thoracotomy with vertebrectomy of T9 and T10 for osteomyelitis decompression of epidural abscess reconstruction with Exe-Cort titanium implant and lateral plate fixation with traverse plate local autograft from T7 rib.PMH is significant for history of thoracic osteomyelitis, type 2 diabetes, peripheral neuropathy, chronic pancreatitis/pancreatic insufficiency, hyperlipidemia, hypertension, asthma, seasonal allergies, GERD.  Clinical Impression  Pt re-evaluated after procedure listed above. Pt presents with decreased functional mobility secondary to pain (surgical and chest tube site) and right shoulder weakness. Pt requiring min-mod assist for functional mobility. Able to stand from edge of bed to a walker and take side steps. Pt deferring transfer to the chair today. Education provided regarding expected mobility progression. Suspect pt will progress well given appropriate pain control/management. Will plan to progress ambulation as tolerated next session.    Follow Up Recommendations Home health PT    Equipment Recommendations  Rolling walker with 5" wheels;3in1 (PT)    Recommendations for Other Services       Precautions / Restrictions Precautions Precautions: Back;Other (comment) Precaution Booklet Issued: No Precaution Comments: Chest tube Restrictions Weight Bearing Restrictions: No      Mobility  Bed Mobility Overal bed mobility: Needs Assistance Bed Mobility: Rolling;Sidelying to Sit;Sit to  Sidelying Rolling: Min assist Sidelying to sit: Min assist     Sit to sidelying: Mod assist General bed mobility comments: Requiring minA to roll via bed pad to guide hips, assist at trunk for elevation, assist for BLE's back into bed    Transfers Overall transfer level: Needs assistance Equipment used: Rolling walker (2 wheeled) Transfers: Sit to/from Stand Sit to Stand: Min assist         General transfer comment: Cues for hand placement, minA to rise from edge of bed  Ambulation/Gait Ambulation/Gait assistance: Min guard Gait Distance (Feet): 3 Feet Assistive device: Rolling walker (2 wheeled) Gait Pattern/deviations: Step-through pattern;Decreased stride length Gait velocity: decreased   General Gait Details: x3 side steps edge of bed, min guard for safety.  Stairs            Wheelchair Mobility    Modified Rankin (Stroke Patients Only)       Balance Overall balance assessment: Needs assistance Sitting-balance support: No upper extremity supported;Feet supported Sitting balance-Leahy Scale: Good     Standing balance support: Bilateral upper extremity supported;During functional activity Standing balance-Leahy Scale: Poor                               Pertinent Vitals/Pain Pain Assessment: Faces Faces Pain Scale: Hurts whole lot Pain Location: R flank, surgical site Pain Descriptors / Indicators: Discomfort;Grimacing;Guarding Pain Intervention(s): Limited activity within patient's tolerance;Monitored during session;Premedicated before session;Ice applied;Repositioned    Home Living Family/patient expects to be discharged to:: Private residence Living Arrangements: Spouse/significant other Available Help at Discharge: Family;Available PRN/intermittently Type of Home: House Home Access: Stairs to enter Entrance Stairs-Rails: None Entrance Stairs-Number of Steps: 1 Home Layout: One level Home Equipment: Cane - quad      Prior  Function Level of Independence: Independent with assistive  device(s)         Comments: has been using quad cane for the past few weeks; able to continue to complete ADL from sit/stand level     Hand Dominance   Dominant Hand: Right    Extremity/Trunk Assessment   Upper Extremity Assessment Upper Extremity Assessment: RUE deficits/detail;LUE deficits/detail RUE Deficits / Details: Shoulder flexion AROM to 100 degrees LUE Deficits / Details: WFL    Lower Extremity Assessment Lower Extremity Assessment: RLE deficits/detail;LLE deficits/detail RLE Deficits / Details: Strength 5/5 LLE Deficits / Details: Strength 5/5       Communication   Communication: No difficulties  Cognition Arousal/Alertness: Awake/alert Behavior During Therapy: WFL for tasks assessed/performed Overall Cognitive Status: Within Functional Limits for tasks assessed                                        General Comments      Exercises     Assessment/Plan    PT Assessment Patient needs continued PT services  PT Problem List Decreased strength;Decreased activity tolerance;Decreased balance;Pain       PT Treatment Interventions Patient/family education;DME instruction;Gait training;Stair training;Functional mobility training;Therapeutic activities;Therapeutic exercise;Balance training    PT Goals (Current goals can be found in the Care Plan section)  Acute Rehab PT Goals Patient Stated Goal: less pain PT Goal Formulation: With patient Time For Goal Achievement: 01/18/21 Potential to Achieve Goals: Good    Frequency Min 5X/week   Barriers to discharge Decreased caregiver support      Co-evaluation               AM-PAC PT "6 Clicks" Mobility  Outcome Measure Help needed turning from your back to your side while in a flat bed without using bedrails?: A Little Help needed moving from lying on your back to sitting on the side of a flat bed without using bedrails?: A  Little Help needed moving to and from a bed to a chair (including a wheelchair)?: A Little Help needed standing up from a chair using your arms (e.g., wheelchair or bedside chair)?: A Little Help needed to walk in hospital room?: A Little Help needed climbing 3-5 steps with a railing? : A Lot 6 Click Score: 17    End of Session   Activity Tolerance: Patient tolerated treatment well Patient left: in bed;with call bell/phone within reach Nurse Communication: Mobility status PT Visit Diagnosis: Unsteadiness on feet (R26.81);Muscle weakness (generalized) (M62.81)    Time: 2993-7169 PT Time Calculation (min) (ACUTE ONLY): 27 min   Charges:   PT Evaluation $PT Re-evaluation: 1 Re-eval PT Treatments $Therapeutic Activity: 8-22 mins        Wyona Almas, PT, DPT Acute Rehabilitation Services Pager [phone] Office [phone]   [name] Etienne 01/04/2021, 1:50 PM

## 2021-01-04 NOTE — Progress Notes (Signed)
Notified of patient hgb of 5.2.  Advised RN to notify CT surgery/ Neurosurgery.  Will order type and cross with 2 units of pRBCs  Eulis Foster, MD  [place], PGY-2  [place] [facility] Pager [phone]

## 2021-01-04 NOTE — Progress Notes (Signed)
CALL PAGER [phone] for any questions or notifications regarding this patient  FMTS Attending Note: [name] MD I spoke with our might team, Drs. Volanda Napoleon and [employer]. Patient now requiring oxygen. Very concerning. I looked at the CXR and did not see anything obvious, but I am concerned he may be bleeding somewhere. - We will add a third unit of PRBC to be transfused, reach out to NSU and /or CT surgery--both were involved in his surgery so not sure who is best or most appropriate source to contact first.   - Ordering a stat CT chest unless surgery has some other idea or recommendation about best imaging.   - Also will notify his family as this is a significant change in status. Night team is following extremely closely.

## 2021-01-04 NOTE — Progress Notes (Signed)
Patient ordered for Lantus 10 units. Patient requests to only have half of that, states that he hasn't been taking the Lantus since he's been here. Lantus 9 units noted to not be given yesterday. Patient thoroughly educated on hyperglycemia and current ordered hospital diabetic medications vs home medications. Patient reports good understanding of teaching, and will take the ordered Lantus tomorrow. Dr. Nori Riis aware of above.

## 2021-01-05 ENCOUNTER — Inpatient Hospital Stay (HOSPITAL_COMMUNITY): Payer: 59

## 2021-01-05 DIAGNOSIS — E1169 Type 2 diabetes mellitus with other specified complication: Secondary | ICD-10-CM | POA: Diagnosis not present

## 2021-01-05 DIAGNOSIS — Z9889 Other specified postprocedural states: Secondary | ICD-10-CM | POA: Diagnosis not present

## 2021-01-05 DIAGNOSIS — T8182XA Emphysema (subcutaneous) resulting from a procedure, initial encounter: Secondary | ICD-10-CM | POA: Diagnosis not present

## 2021-01-05 DIAGNOSIS — J9601 Acute respiratory failure with hypoxia: Secondary | ICD-10-CM | POA: Diagnosis not present

## 2021-01-05 DIAGNOSIS — Z794 Long term (current) use of insulin: Secondary | ICD-10-CM | POA: Diagnosis not present

## 2021-01-05 DIAGNOSIS — A312 Disseminated mycobacterium avium-intracellulare complex (DMAC): Secondary | ICD-10-CM | POA: Diagnosis not present

## 2021-01-05 DIAGNOSIS — J90 Pleural effusion, not elsewhere classified: Secondary | ICD-10-CM | POA: Diagnosis not present

## 2021-01-05 DIAGNOSIS — T797XXA Traumatic subcutaneous emphysema, initial encounter: Secondary | ICD-10-CM | POA: Diagnosis not present

## 2021-01-05 DIAGNOSIS — R0602 Shortness of breath: Secondary | ICD-10-CM | POA: Diagnosis not present

## 2021-01-05 DIAGNOSIS — Z20822 Contact with and (suspected) exposure to covid-19: Secondary | ICD-10-CM | POA: Diagnosis not present

## 2021-01-05 DIAGNOSIS — J939 Pneumothorax, unspecified: Secondary | ICD-10-CM | POA: Diagnosis not present

## 2021-01-05 DIAGNOSIS — R710 Precipitous drop in hematocrit: Secondary | ICD-10-CM | POA: Diagnosis not present

## 2021-01-05 DIAGNOSIS — D649 Anemia, unspecified: Secondary | ICD-10-CM | POA: Diagnosis not present

## 2021-01-05 DIAGNOSIS — K5900 Constipation, unspecified: Secondary | ICD-10-CM | POA: Diagnosis not present

## 2021-01-05 DIAGNOSIS — J9811 Atelectasis: Secondary | ICD-10-CM | POA: Diagnosis not present

## 2021-01-05 DIAGNOSIS — E1140 Type 2 diabetes mellitus with diabetic neuropathy, unspecified: Secondary | ICD-10-CM | POA: Diagnosis not present

## 2021-01-05 DIAGNOSIS — M869 Osteomyelitis, unspecified: Secondary | ICD-10-CM | POA: Diagnosis not present

## 2021-01-05 DIAGNOSIS — M4716 Other spondylosis with myelopathy, lumbar region: Secondary | ICD-10-CM | POA: Diagnosis not present

## 2021-01-05 DIAGNOSIS — G061 Intraspinal abscess and granuloma: Secondary | ICD-10-CM | POA: Diagnosis not present

## 2021-01-05 DIAGNOSIS — Z452 Encounter for adjustment and management of vascular access device: Secondary | ICD-10-CM | POA: Diagnosis not present

## 2021-01-05 DIAGNOSIS — M4624 Osteomyelitis of vertebra, thoracic region: Secondary | ICD-10-CM | POA: Diagnosis not present

## 2021-01-05 DIAGNOSIS — D62 Acute posthemorrhagic anemia: Secondary | ICD-10-CM | POA: Diagnosis not present

## 2021-01-05 DIAGNOSIS — K861 Other chronic pancreatitis: Secondary | ICD-10-CM | POA: Diagnosis not present

## 2021-01-05 LAB — BPAM RBC
Blood Product Expiration Date: 202205202359
Blood Product Expiration Date: 202205202359
Blood Product Expiration Date: 202205202359
Blood Product Expiration Date: 202205202359
ISSUE DATE / TIME: 202204221731
ISSUE DATE / TIME: 202204231920
ISSUE DATE / TIME: 202204232101
ISSUE DATE / TIME: 202204232254
Unit Type and Rh: 5100
Unit Type and Rh: 5100
Unit Type and Rh: 5100
Unit Type and Rh: 5100

## 2021-01-05 LAB — BASIC METABOLIC PANEL
Anion gap: 8 (ref 5–15)
Anion gap: 7 (ref 5–15)
BUN: 32 mg/dL — ABNORMAL HIGH (ref 6–20)
BUN: 26 mg/dL — ABNORMAL HIGH (ref 6–20)
CO2: 25 mmol/L (ref 22–32)
CO2: 25 mmol/L (ref 22–32)
Calcium: 7.8 mg/dL — ABNORMAL LOW (ref 8.9–10.3)
Calcium: 8 mg/dL — ABNORMAL LOW (ref 8.9–10.3)
Chloride: 95 mmol/L — ABNORMAL LOW (ref 98–111)
Chloride: 98 mmol/L (ref 98–111)
Creatinine, Ser: 1.44 mg/dL — ABNORMAL HIGH (ref 0.61–1.24)
Creatinine, Ser: 0.93 mg/dL (ref 0.61–1.24)
GFR, Estimated: 58 mL/min — ABNORMAL LOW (ref >60)
GFR, Estimated: >60 mL/min (ref >60)
Glucose, Bld: 258 mg/dL — ABNORMAL HIGH (ref 70–99)
Glucose, Bld: 203 mg/dL — ABNORMAL HIGH (ref 70–99)
Potassium: 4.5 mmol/L (ref 3.5–5.1)
Potassium: 4 mmol/L (ref 3.5–5.1)
Sodium: 128 mmol/L — ABNORMAL LOW (ref 135–145)
Sodium: 130 mmol/L — ABNORMAL LOW (ref 135–145)

## 2021-01-05 LAB — GLUCOSE, CAPILLARY
Glucose-Capillary: 283 mg/dL — ABNORMAL HIGH (ref 70–99)
Glucose-Capillary: 265 mg/dL — ABNORMAL HIGH (ref 70–99)
Glucose-Capillary: 145 mg/dL — ABNORMAL HIGH (ref 70–99)
Glucose-Capillary: 188 mg/dL — ABNORMAL HIGH (ref 70–99)

## 2021-01-05 LAB — CBC
HCT: 35 % — ABNORMAL LOW (ref 39.0–52.0)
Hemoglobin: 11.8 g/dL — ABNORMAL LOW (ref 13.0–17.0)
MCH: 28.8 pg (ref 26.0–34.0)
MCHC: 33.7 g/dL (ref 30.0–36.0)
MCV: 85.4 fL (ref 80.0–100.0)
Platelets: 385 10*3/uL (ref 150–400)
RBC: 4.1 MIL/uL — ABNORMAL LOW (ref 4.22–5.81)
RDW: 14.9 % (ref 11.5–15.5)
WBC: 13.9 10*3/uL — ABNORMAL HIGH (ref 4.0–10.5)
nRBC: 0 % (ref 0.0–0.2)

## 2021-01-05 LAB — TYPE AND SCREEN
ABO/RH(D): O POS
Antibody Screen: NEGATIVE
Unit division: 0
Unit division: 0
Unit division: 0
Unit division: 0

## 2021-01-05 LAB — CK: Total CK: 365 U/L (ref 49–397)

## 2021-01-05 MED ORDER — INSULIN GLARGINE 100 UNIT/ML ~~LOC~~ SOLN
15.0000 [IU] | Freq: Every day | SUBCUTANEOUS | Status: DC
  Filled 2021-01-05: qty 0.15

## 2021-01-05 MED ORDER — ORAL CARE MOUTH RINSE
15.0000 mL | Freq: Two times a day (BID) | OROMUCOSAL | Status: DC
  Administered 2021-01-05 – 2021-01-17 (×16): 15 mL via OROMUCOSAL

## 2021-01-05 MED ORDER — INSULIN GLARGINE 100 UNIT/ML ~~LOC~~ SOLN
10.0000 [IU] | Freq: Every day | SUBCUTANEOUS | Status: DC
  Administered 2021-01-05: 10 [IU] via SUBCUTANEOUS
  Filled 2021-01-05 (×2): qty 0.1

## 2021-01-05 NOTE — Progress Notes (Signed)
FPTS Interim Progress Note  Went to beside to check on patient along with Dr. Chauncey Reading. Patient sitting upright in bed eating, appears to be in no acute distress. Endorsing that he just feels very tired. He states that he is feeling much better compared to the night prior. Endorses that his pain has been very tolerable, rates it a 4/10 currently. He is content with his current pain regimen. Breathing comfortably on room air. Informed patient to inform the primary team if he needs anything throughout the night, he agreed and has no further concerns at this time. Report from nurse was similar, informed nurse to let our team know of any updates.    Donney Dice, DO 01/05/2021, 9:14 PM PGY-1, [facility] [place] [facility] pager [phone]

## 2021-01-05 NOTE — Progress Notes (Signed)
Kidney levels in AM labs noted. IVF infusing as ordered. PO intake continues to be excellent. Confirmed with night RN labs were obtained prior to patient requiring straight cath overnight. Family medicine resident made aware.

## 2021-01-05 NOTE — Progress Notes (Signed)
2 Days Post-Op Procedure(s) (LRB): Decompression of  Thoracic nine and Thoracic ten via corpectomy reconstruction with titanium spacer and rib autogrtaft. Lateral plate fixation Thoracic eight - Thoracic eleven (Right) THORACIC EXPOSURE with removal of rib (Right) Subjective:  Feels better. Walked to bathroom.  Had hypotension, tachycardia yesterday afternoon and CBC sent that showed Hgb of 5.2, down from 10.4 in the morning. He had no visible blood loss. He was transfused 3 units. CT of CAP negative for any sign of blood loss. Hgb this am 11.8.  Objective: Vital signs in last 24 hours: Temp:  [97.7 F (36.5 C)-99.1 F (37.3 C)] 97.7 F (36.5 C) (04/24 0800) Pulse Rate:  [90-106] 91 (04/24 0900) Cardiac Rhythm: Normal sinus rhythm (04/24 0800) Resp:  [15-29] 21 (04/24 0900) BP: (70-122)/(40-83) 114/76 (04/24 0900) SpO2:  [88 %-97 %] 95 % (04/24 0900) Weight:  [111.3 kg] 111.3 kg (04/24 0500)  Hemodynamic parameters for last 24 hours:    Intake/Output from previous day: 04/23 0701 - 04/24 0700 In: 1871.6 [I.V.:756.6; Blood:945; IV Piggyback:170] Out: 1440 [Urine:890; Chest Tube:550] Intake/Output this shift: Total I/O In: 725.1 [I.V.:725.1] Out: -   General appearance: alert and cooperative Heart: regular rate and rhythm Lungs: clear to auscultation bilaterally Abdomen: soft, non-tender; bowel sounds normal Wound: dressing dry. Chest tube output is thin, serosanguinous. No air leak  Lab Results: Recent Labs    01/04/21 1821 01/05/21 0226  WBC 6.6 13.9*  HGB 5.2* 11.8*  HCT 16.6* 35.0*  PLT 199 385   BMET:  Recent Labs    01/04/21 0505 01/05/21 0226  NA 130* 128*  K 4.2 4.5  CL 96* 95*  CO2 23 25  GLUCOSE 208* 258*  BUN 20 32*  CREATININE 0.92  0.87 1.44*  CALCIUM 8.1* 7.8*    PT/INR: No results for input(s): LABPROT, INR in the last 72 hours. ABG    Component Value Date/Time   PHART 7.290 (L) 01/03/2021 2115   HCO3 29.8 (H) 01/03/2021 2115   TCO2  32 01/03/2021 2115   O2SAT 100.0 01/03/2021 2115   CBG (last 3)  Recent Labs    01/04/21 1816 01/04/21 1929 01/05/21 0812  GLUCAP 228* 227* 283*   CXR this am is clear with no pleural effusion  Assessment/Plan: S/P Procedure(s) (LRB): Decompression of  Thoracic nine and Thoracic ten via corpectomy reconstruction with titanium spacer and rib autogrtaft. Lateral plate fixation Thoracic eight - Thoracic eleven (Right) THORACIC EXPOSURE with removal of rib (Right)  Hemodynamically stable.  Chest tube output has continued to decrease so I think it can be removed today. Will repeat CXR in am.   Further care per primary team.    LOS: 8 days    Gaye Pollack 01/05/2021

## 2021-01-05 NOTE — Anesthesia Postprocedure Evaluation (Signed)
Anesthesia Post Note  Patient: Christopher Fernandez  Procedure(s) Performed: Decompression of  Thoracic nine and Thoracic ten via corpectomy reconstruction with titanium spacer and rib autogrtaft. Lateral plate fixation Thoracic eight - Thoracic eleven (Right Chest) THORACIC EXPOSURE with removal of rib (Right Chest)     Patient location during evaluation: PACU Anesthesia Type: General Level of consciousness: awake and alert Pain management: pain level controlled Vital Signs Assessment: post-procedure vital signs reviewed and stable Respiratory status: spontaneous breathing, nonlabored ventilation, respiratory function stable and patient connected to nasal cannula oxygen Cardiovascular status: blood pressure returned to baseline and stable Postop Assessment: no apparent nausea or vomiting Anesthetic complications: no   No complications documented.  Last Vitals:  Vitals:   01/05/21 1800 01/05/21 2000  BP: (!) 101/52   Pulse: 91   Resp: 19   Temp:  37.2 C  SpO2: 94%     Last Pain:  Vitals:   01/05/21 2000  TempSrc: Oral  PainSc:                  March Rummage Jaanvi Fizer

## 2021-01-05 NOTE — Progress Notes (Signed)
FPTS Interim Progress Note  S:Went to assess patient.  Feeling a little better but continues to have some shortness of breath. Reports that he thinks this may be due to his constipation as he has had similar symptoms in the past.  Blood pressure is improving, now 110/70.  Has received 3 units PRBCs.  Reports that he has not been taking Norvasc in at least 3 years.  Denies any chest pain or abdominal pain.  O: BP 103/71   Pulse (!) 101   Temp 98.5 F (36.9 C) (Oral)   Resp (!) 21   Ht 6\' 3"  (1.905 m)   Wt 106.5 kg   SpO2 91%   BMI 29.35 kg/m    Physical Exam:  General: 50 y.o. male in NAD Cardio: RRR no m/r/g Lungs: CTAB, no wheezing, no rhonchi, no crackles, no IWOB. Abdomen: Soft, non-tender to palpation, non-distended, positive bowel sounds Skin: warm and dry.  Surgical incision edema without hematoma.  Chest tube insertion site dry and intact, no air leak Extremities: No edema  A/P: Hypotension Improving:  CT chest/abd/pelvis negative for acute bleed.  Shows small bilateral pleural effusion and basilar atelectasis.  Right chest tube in place with minimal apical pneumothorax.  Subcutaneous emphysema in the right chest wall. No free air or fluid in abdomen.  -Continue IVF -Discotninue Norvasc, reassess when BP tolerate -BMP and CBC pending   Carollee Leitz, MD 01/05/2021, 1:53 AM PGY-2, [place] [facility] pager [phone]

## 2021-01-05 NOTE — Progress Notes (Signed)
   Providing Compassionate, Quality Care - Together  NEUROSURGERY PROGRESS NOTE   S: Episodes of hypotension and tachycardia, false lab reading of anemia  O: EXAM:  BP 114/76   Pulse 91   Temp 97.7 F (36.5 C) (Oral)   Resp (!) 21   Ht 6\' 3"  (1.905 m)   Wt 111.3 kg   SpO2 95%   BMI 30.67 kg/m   Awake, alert, oriented  Speech fluent, appropriate  CNs grossly intact  Moving all extremities equally Incision clean dry and intact Sensory intact light touch Chest tube in place  ASSESSMENT:  51 y.o. male with   1.  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  Thank you for allowing me to participate in this patient's care.  Please do not hesitate to call with questions or concerns.   Elwin Sleight, [facility] Cell: [phone]

## 2021-01-05 NOTE — Progress Notes (Addendum)
Family Medicine Teaching Service Daily Progress Note Intern Pager: [phone]  Patient name: DEMTRIUS Fernandez Medical record number: [id number] Date of birth: Apr 02, 1971 Age: 50 y.o. Gender: male  Primary Care Provider: Danna Hefty, DO Consultants: ID, neurosurgery Code Status: Full   Pt Overview and Major Events to Date:  4/15: Admitted  4/22: Vertebrectomy, osteomyelitis decompression of epidural abscess  Assessment and Plan: Christopher Fernandez a 50 y.o.malepresenting with worsening osteomyelitis and concern of new abscess/phlegmon in thoracic spine after having a repeat MRI a few days ago. PMH is significant forhistory of thoracic osteomyelitis, type 2 diabetes, peripheral neuropathy, chronic pancreatitis/pancreatic insufficiency, hyperlipidemia, hypertension, asthma, seasonal allergies, GERD.  Osteomyelitis/discitiswithnew abscess/phlegmoninthoracic spine, POD 1 Surgical Decompression Pain seems to be well controlled at this time.  Status post neurosurgery on 4/22 with T9-T10 osteodiscitis and a T8-T11 lateral fusion and T9-T10 corpectomy. -Neurosurgeryfollowing, appreciate continued involvement and recommendations -ID following, appreciate recs including continuedaptomycin  -IV daptomycin(4/15-) -Pain regimen: tylenol 1000 mg q6h, meloxicam 15 mg daily, oxycodone 10 mg every 4 hours scheduled, Dilaudid 0.5 mg every 2 hours for severe/breakthrough pain -Patient also continues on Robaxin 500 mg every 6 hours as needed for muscle spasms and gabapentin 600 mg 3 times daily as well as Cymbalta 60 mg daily. -PT/OT evaluate and treat: recommended rolling walker with 5" wheels and 3 in 1 bedside commode  Acute anemia  History of anemia  Patient's hemoglobin yesterday returned at 5.2.  Received 3 units packed rbcs. Current hgb11.8.  CT chest abdomen pelvis with no obvious signs of bleeding.  Because of the hemoglobin jumping from 5.2 to 11.8 after 3 units this was likely  artificially low.  This is further reinforced by the fact that on the CBC with the low hemoglobin the patient also had a 50% drop in white blood cell count, platelets compared to the previous CBC and repeat CBC after getting blood. -monitor CBC  Hypotension-resolved Patient with blood pressures yesterday in the 30Z-60 systolic.  Patient was on amlodipine 2.5 mg daily.  Current blood pressure normotensive, 108/73. -Discontinued amlodipine -Continue to monitor blood pressure  AKI  urinary retention: Creatinine yesterday of 0.92, 1.44 today.  AKI likely secondary to multiple factors including anemia, hypoperfusion with hypotension, recent surgery, and urinary retention.  Patient did have a in and out cath overnight with 590 cc urine.  Per the patient's nurse the labs were drawn prior to this.  On physical exam patient does have palpable bladder.  Spoke with nurse in the room who was getting ready to bladder scan the patient. -Bladder scan as above -We will do In-N-Out cath as needed, can consider Foley catheter if patient requires ongoing assistance. -Bladder scan if patient feels unable to void -A.m. BMP -Continue to monitor  Osteomyelitis right hip, stable Patient with MRI evidence from 12/11/2010 worsening osteomyelitis of theparasymphyseal right superior pubic ramus with improvement in right abductor minimus myositis. -IDconsulted, appreciate continued involvement and recs -IV daptomycin   Moderatethoracicspinal stenosis, POD#1 Spinal Decompression Most recent MRI the patient was having worsening spinal stenosis at T9-T10 in conjunction with his epidural abscess/phlegmon. This was classified as moderate on the MRI scan.  -Neurosurgery consulted, appreciate recs and guidance  T2DM with neuropathy [id number] morning.Home medications include Januvia 100 mg daily, metformin 1000 mg twice daily, sliding scale insulin, 18 units insulin glargine in the mornings, Jardiance 25 mg  daily.Also on gabapentin for neuropathy.Most recent A1c 9.1.  Patient got 13 units fast acting insulin overnight however patient refused his 10  units of Lantus yesterday and would only allow 5 units of Lantus.  Per patient nurse the patient is amenable to taking full dose of 10 units today. -Continue home metformin 1000mg  BID -Hold Jardiance and Januvia -mSSI, CBG check TID -Continue Lantus 10 units daily -Continue Gabapentin 600mg  TID for Neuropathies  Elevated blood pressures, resolved Normotensive. Takes propranolol for tremors. Likely secondary to pain as he is still complaining of pain. -pain regimen as above -holding amlodipine 2.5 mg  Chronic pancreatitis/pancreatic insufficiency Home medications include pancrelipase3 times per day with meals. -We will continue with pancreatic enzymes from our formulary  Hyperlipidemia Home medications include Lipitor 40. Last cholesterol panel showsLDL of 34, HDL 34. -Continue home Lipitor  Asthma and history of seasonal allergies Home medications include albuterol as needed, Advair daily, Singulair, Zyrtec. -Continue home medications -Dulera per formulary   History of alcohol abuse/tobacco use disorder Patient states his last drink was about 1.5 years ago. Continues to smoke about half pack per day, previously smoked about a pack per day for 35 years. -nicotine patch as desired  Anxiety Depression Home medications include duloxetine 60 mg daily, Abilify 5 mg daily. -Continue home medications  GERD Home medications include pantoprazole 40 mg twice daily -Continue home medications  History of tremors Home medications include propranolol -Continue home propranolol  Left foot recent toe amputation Occurred on 3/23. Patient without complaints of pain in that region today. 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 -Continue to monitor  Unintentional weight loss: Weight loss of about 30-35  pounds per patient on admission.Weight stable thus far. -daily weights -encourage diet -nutrition consulted  FEN/GI: carb modified diet PPx: SCDs only while monitoring hemoglobin  Status is: Inpatient  Remains inpatient appropriate because:Inpatient level of care appropriate due to severity of illness  Dispo:  Patient From: Home  Planned Disposition: Home with Health Care Svc  Medically stable for discharge: No     Subjective:  Patient states his pain is pretty well controlled right now.  He denies any other complaints at this time other than he has not been able to void since his In-N-Out cath last night.   Objective: Temp:  [97.8 F (36.6 C)-99.1 F (37.3 C)] 98.5 F (36.9 C) (04/24 0026) Pulse Rate:  [83-106] 93 (04/24 0600) Resp:  [13-29] 20 (04/24 0600) BP: (70-119)/(40-81) 107/63 (04/24 0600) SpO2:  [88 %-96 %] 94 % (04/24 0600) Weight:  [111.3 kg] 111.3 kg (04/24 0500) Physical Exam: General: Alert and oriented in no apparent distress Heart: Regular rate and rhythm with no murmurs appreciated Lungs: CTA bilaterally, no wheezing Abdomen: Bowel sounds present, no abdominal pain, patient does have a palpable bladder with some urgency noted when palpating the bladder but no pain Extremities: No lower extremity edema   Laboratory: Recent Labs  Lab 01/04/21 0505 01/04/21 1821 01/05/21 0226  WBC 12.2* 6.6 13.9*  HGB 10.4* 5.2* 11.8*  HCT 32.2* 16.6* 35.0*  PLT 422* 199 385   Recent Labs  Lab 01/03/21 0316 01/03/21 1856 01/03/21 2115 01/04/21 0505 01/05/21 0226  NA 132*   < > 131* 130* 128*  K 4.1   < > 6.0* 4.2 4.5  CL 96*  --   --  96* 95*  CO2 28  --   --  23 25  BUN 13  --   --  20 32*  CREATININE 0.70  --   --  0.92  0.87 1.44*  CALCIUM 8.7*  --   --  8.1* 7.8*  GLUCOSE 168*  --   --  208* 258*   < > = values in this interval not displayed.    Imaging/Diagnostic Tests: DG CHEST PORT 1 VIEW  Result Date: 01/04/2021 CLINICAL DATA:  Post  thoracotomy chest tube in place EXAM: PORTABLE CHEST 1 VIEW COMPARISON:  January 03, 2021 FINDINGS: Fill position the right thoracostomy tube with tip at the. No visible pneumothorax. Right IJ CVC and right upper extremity with tip projecting the SVC. Low lung volumes bibasilar atelectasis. No significant pleural effusion. Cardiomediastinal silhouette under unchanged. Midthoracic surgery with lateral plate and screw fixation and corpectomy. IMPRESSION: 1. Right thoracostomy tube with tip at the apex. No visible pneumothorax. 2. Low lung volumes with bibasilar atelectasis. Electronically Signed   By: Dahlia Bailiff MD   On: 01/04/2021 20:11   CT CHEST ABDOMEN PELVIS WO CONTRAST  Result Date: 01/05/2021 CLINICAL DATA:  Prior spinal surgery, now with symptomatic anemia. EXAM: CT CHEST, ABDOMEN AND PELVIS WITHOUT CONTRAST TECHNIQUE: Multidetector CT imaging of the chest, abdomen and pelvis was performed following the standard protocol without IV contrast. COMPARISON:  CT 10/23/2020.  MRI 11/28/2020 FINDINGS: CT CHEST FINDINGS Cardiovascular: Normal heart size. No pericardial effusions. Normal caliber thoracic aorta. Mediastinum/Nodes: Thyroid gland is unremarkable. Esophagus is decompressed. No significant lymphadenopathy. Right PICC line and right central venous catheter with tips in the low SVC. Lungs/Pleura: Small bilateral pleural effusions. Basilar atelectasis bilaterally. A right chest tube is in place with minimal residual right apical pneumothorax. Musculoskeletal: Subcutaneous emphysema in the right chest wall. Postoperative changes in the midthoracic spine. CT ABDOMEN PELVIS FINDINGS Hepatobiliary: Limited visualization due to streak artifact but no obvious abnormalities identified. Gallbladder is not seen and may be contracted or surgically absent. Pancreas: Diffuse pancreatic parenchymal calcifications consistent with chronic pancreatitis. No acute inflammatory changes. 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 Adrenals/Urinary Tract: Adrenal glands are unremarkable. Kidneys are normal, without renal calculi, focal lesion, or hydronephrosis. Bladder is unremarkable. Stomach/Bowel: Stomach is filled with ingested material. Diffusely stool-filled colon. Stomach, small bowel, and colon are not abnormally distended. No wall thickening or inflammatory changes are appreciated. Appendix is not identified. Vascular/Lymphatic: No significant vascular findings are present. No enlarged abdominal or pelvic lymph nodes. Reproductive: Prostate is unremarkable. Other: No free air or free fluid in the abdomen. Abdominal wall musculature appears intact. Musculoskeletal: No acute or significant osseous findings. IMPRESSION: 1. Small bilateral pleural effusions with basilar atelectasis. Right chest tube is in place with minimal residual right apical pneumothorax. Subcutaneous emphysema in the right chest wall. 2. Changes of chronic pancreatitis. 3. No evidence of bowel obstruction or inflammation. Diffusely stool-filled colon. 4. 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 Electronically Signed   By: Lucienne Capers M.D.   On: 01/05/2021 01:17    Lurline Del, DO 01/05/2021, 6:59 AM PGY-2, [place] Intern pager: [phone], text pages welcome

## 2021-01-06 ENCOUNTER — Encounter (HOSPITAL_COMMUNITY): Payer: Self-pay | Admitting: Neurological Surgery

## 2021-01-06 ENCOUNTER — Inpatient Hospital Stay (HOSPITAL_COMMUNITY): Payer: 59

## 2021-01-06 DIAGNOSIS — Z9889 Other specified postprocedural states: Secondary | ICD-10-CM

## 2021-01-06 DIAGNOSIS — R710 Precipitous drop in hematocrit: Secondary | ICD-10-CM

## 2021-01-06 DIAGNOSIS — G061 Intraspinal abscess and granuloma: Secondary | ICD-10-CM | POA: Diagnosis not present

## 2021-01-06 DIAGNOSIS — M869 Osteomyelitis, unspecified: Secondary | ICD-10-CM | POA: Diagnosis not present

## 2021-01-06 LAB — CBC
HCT: 35.3 % — ABNORMAL LOW (ref 39.0–52.0)
Hemoglobin: 11.3 g/dL — ABNORMAL LOW (ref 13.0–17.0)
MCH: 28.2 pg (ref 26.0–34.0)
MCHC: 32 g/dL (ref 30.0–36.0)
MCV: 88 fL (ref 80.0–100.0)
Platelets: 386 10*3/uL (ref 150–400)
RBC: 4.01 MIL/uL — ABNORMAL LOW (ref 4.22–5.81)
RDW: 15.1 % (ref 11.5–15.5)
WBC: 14.4 10*3/uL — ABNORMAL HIGH (ref 4.0–10.5)
nRBC: 0 % (ref 0.0–0.2)

## 2021-01-06 LAB — BASIC METABOLIC PANEL
Anion gap: 9 (ref 5–15)
BUN: 19 mg/dL (ref 6–20)
CO2: 24 mmol/L (ref 22–32)
Calcium: 8.3 mg/dL — ABNORMAL LOW (ref 8.9–10.3)
Chloride: 98 mmol/L (ref 98–111)
Creatinine, Ser: 0.75 mg/dL (ref 0.61–1.24)
GFR, Estimated: >60 mL/min (ref >60)
Glucose, Bld: 277 mg/dL — ABNORMAL HIGH (ref 70–99)
Potassium: 4.3 mmol/L (ref 3.5–5.1)
Sodium: 131 mmol/L — ABNORMAL LOW (ref 135–145)

## 2021-01-06 LAB — CK: Total CK: 236 U/L (ref 49–397)

## 2021-01-06 LAB — GLUCOSE, CAPILLARY
Glucose-Capillary: 271 mg/dL — ABNORMAL HIGH (ref 70–99)
Glucose-Capillary: 204 mg/dL — ABNORMAL HIGH (ref 70–99)
Glucose-Capillary: 185 mg/dL — ABNORMAL HIGH (ref 70–99)
Glucose-Capillary: 152 mg/dL — ABNORMAL HIGH (ref 70–99)

## 2021-01-06 MED ORDER — BENZONATATE 100 MG PO CAPS
100.0000 mg | ORAL_CAPSULE | Freq: Once | ORAL | Status: AC
  Administered 2021-01-06: 100 mg via ORAL
  Filled 2021-01-06: qty 1

## 2021-01-06 MED ORDER — RIFAMPIN 300 MG PO CAPS
300.0000 mg | ORAL_CAPSULE | Freq: Two times a day (BID) | ORAL | Status: DC
  Administered 2021-01-06 – 2021-01-17 (×22): 300 mg via ORAL
  Filled 2021-01-06 (×25): qty 1

## 2021-01-06 MED ORDER — INSULIN GLARGINE 100 UNIT/ML ~~LOC~~ SOLN
18.0000 [IU] | Freq: Every day | SUBCUTANEOUS | Status: DC
  Administered 2021-01-06 – 2021-01-07 (×2): 18 [IU] via SUBCUTANEOUS
  Filled 2021-01-06 (×5): qty 0.18

## 2021-01-06 MED ORDER — BISACODYL 10 MG RE SUPP
10.0000 mg | Freq: Once | RECTAL | Status: AC
  Administered 2021-01-06: 10 mg via RECTAL
  Filled 2021-01-06: qty 1

## 2021-01-06 MED ORDER — POLYETHYLENE GLYCOL 3350 17 G PO PACK
17.0000 g | PACK | Freq: Two times a day (BID) | ORAL | Status: DC
  Administered 2021-01-06 (×2): 17 g via ORAL
  Filled 2021-01-06 (×4): qty 1

## 2021-01-06 NOTE — Progress Notes (Signed)
OT Cancellation Note  Patient Details Name: Christopher Fernandez MRN: [id number] DOB: 10/26/1970   Cancelled Treatment:       Baird Lyons 01/06/2021, 3:27 PM Maurie Boettcher, OT/L   Acute OT Clinical Specialist Acute Rehabilitation Services Pager [phone] Office [phone]

## 2021-01-06 NOTE — Progress Notes (Signed)
Nutrition Follow-up  DOCUMENTATION CODES:   Not applicable  INTERVENTION:   30 ml Prosource Plus TID, each supplement provides 100 kcals and 15 grams protein  -MVI with minerals daily  -Encourage PO intake and medication compliance  -Reviewed menu options on Room Service dining plan  NUTRITION DIAGNOSIS:   Increased nutrient needs related to post-op healing as evidenced by estimated needs. Ongoing.   GOAL:   Patient will meet greater than or equal to 90% of their needs Progressing.   MONITOR:   PO intake,Supplement acceptance,Labs,Weight trends,Skin,I & O's  REASON FOR ASSESSMENT:   Consult Assessment of nutrition requirement/status  ASSESSMENT:   Christopher Fernandez is a 50 y.o. male presenting with worsening osteomyelitis and concern of new abscess/phlegmon in thoracic spine after having a repeat MRI a few days ago. PMH is significant for history of thoracic osteomyelitis, type 2 diabetes, peripheral neuropathy, chronic pancreatitis/pancreatic insufficiency, hyperlipidemia, hypertension, asthma, seasonal allergies, GERD.  Pt discussed during ICU rounds and with RN.  Per chart review pt has refused some of his insulin, noted elevated cbgs.  Spoke with pt and Therapist, sports. Pt states that he is now taking his insulin. Reports he usually has a good appetite but is having trouble finding foods he likes here. Pt ate 100% of his Breakfast this am.  He reports no BM yet but is on a bowel regimen.   4/22 s/p vertebrectomy, osteomyelitis decompression of epidural abscess; T9-T10 osteodiscitis and a T8-T11 lateral fusion and T9-T10 corpectomy.  Medications reviewed and include: colace, SSI, lantus, creon, glucophage, remeron, MVI with minerals, protonix, miralax, senokot  Labs reviewed: Na 131 CBG's: 145-271     Diet Order:   Diet Order            Diet Carb Modified Fluid consistency: Thin; Room service appropriate? Yes  Diet effective now                 EDUCATION NEEDS:    Education needs have been addressed  Skin:  Skin Assessment: Reviewed RN Assessment  Last BM:  4/21  Height:   Ht Readings from Last 1 Encounters:  12/27/20 6\' 3"  (1.905 m)    Weight:   Wt Readings from Last 1 Encounters:  01/06/21 112 kg    Ideal Body Weight:  89 kg  BMI:  Body mass index is 30.86 kg/m.  Estimated Nutritional Needs:   Kcal:  5883-2549  Protein:  130-155 grams  Fluid:  > 2 L  Christopher Dikes P., RD, LDN, CNSC See AMiON for contact information

## 2021-01-06 NOTE — Progress Notes (Signed)
Family Medicine Teaching Service Daily Progress Note Intern Pager: [phone]  Patient name: Christopher Fernandez Medical record number: [id number] Date of birth: [birth year] Age: 50 y.o. Gender: male  Primary Care Provider: Danna Hefty, DO Consultants: Neurosurgery, cardiothoracic surgery, ID   Code Status: Full Code  Pt Overview and Major Events to Date:  4/15: Admitted  4/22: T8-11 lateral fusion, T9-10 corpectomy, 1u pRBC 4/23: 2u pRBC 4/24: 1u pRBC  Assessment and Plan: Christopher Fernandez is a 50 y.o. male with worsening osteomyelitis and concern of new abscess/phlegmon in thoracic spine now s/p decompression of the thoracic spine with corpectomy reconstruction. PMH is significant forhistory of thoracic osteomyelitis, type 2 diabetes, peripheral neuropathy, chronic pancreatitis/pancreatic insufficiency, hyperlipidemia, hypertension, asthma, seasonal allergies, GERD.  Osteomyelitis/discitiswithnew abscess/phlegmoninthoracic spine POD 3 s/p T8-11 lateral fusion and T9-10 corpectomy Pain well controlled on current regimen. - neurosurgery and CTS following, appreciate recommendation - ID following, appreciate recommendation - IV daptomycin (4/15-) - APAP 1000 mg q6h - meloxicam 15 mg daily - oxycodone 10 mg q4h - IV hydromorphone 0.5 mg q2h prn breakthrough - methocarbamol 500 mg q6h prn - duloxetine 60 mg daily - PT/OT: recommended rolling walker with 5" wheels and 3 in 1 bedside commode  Osteomyelitis of right hip Seen on most recent MRI.  Being treated with daptomycin as above. - IV daptomycin  Blood loss anemia Stable for now.  Postoperatively has received 3 units of pRBC so far.  Hemoglobin stable this morning, 11.3. - monitor CBC  Urinary retention Possible neurogenic component given recent neurosurgery.  So far has required 2 straight caths.  We will plan for Foley catheter if he requires a third straight cath.  AKI has resolved. - check PVR - bladder scan if patient  feels unable to void  T2DM Home medications include Januvia 100 mg daily, metformin 1000 mg twice daily, sliding scale insulin, 18 units insulin glargine in the mornings, Jardiance 25 mg daily.Also on gabapentin for neuropathy.Most recent A1c 9.1. Patient did receive his full dose of Lantus 10 units yesterday.  He is amenable to increasing his Lantus today.  Fasting CBG 271 and he required 18 units of short acting insulin yesterday. - increase Lantus to 18 units daily - sSSI - monitor CBG - continue gabapentin - hold empagliflozin and Januvia  HTN On amlodipine 2.5 mg at home. Normotensive, SBP 100s-110s. - hold amlodipine  Constipation No BM in 5 days. Update: had a large BM this afternoon. - increase Miralax to BID - continue docusate BID - continue senna BID  Chronic pancreatitis On pancrelipase 3 times daily with meals. - continue Creon TID  Asthma and history of seasonal allergies Home medications include albuterol as needed, Advair daily, Singulair, Zyrtec. -Continue home medications -Dulera per formulary   History of alcohol abuse/tobacco use disorder Patient states his last drink was about 1.5 years ago. Continues to smoke about half pack per day, previously smoked about a pack per day for 35 years. -nicotine patch as desired  Anxiety Depression Home medications include duloxetine 60 mg daily, Abilify 5 mg daily. -Continue home medications  GERD Home medications include pantoprazole 40 mg twice daily -Continue home medications  History of tremors Home medications include propranolol -Continue home propranolol  Left foot recent toe amputation Occurred on 3/23. 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 -Continue to monitor  Unintentional weight loss: Weight loss of about 30-35 pounds per patient on admission.Weight stable thus far. -daily weights -encourage diet -nutrition consulted  FEN/GI: carb  modified diet PPx:  SCDs  Disposition: ICU, plan for home with services when medically stable  Subjective:  NAOE.  Patient states pain is manageable with current medications.  He reports he has not had a BM in 5 days.  Objective: Temp:  [97.6 F (36.4 C)-98.9 F (37.2 C)] 98.7 F (37.1 C) (04/25 0400) Pulse Rate:  [84-109] 109 (04/25 0600) Resp:  [14-26] 20 (04/25 0600) BP: (99-129)/(52-85) 117/78 (04/25 0600) SpO2:  [86 %-97 %] 93 % (04/25 0600) Weight:  [564 kg] 112 kg (04/25 0500) Physical Exam: General: Obese middle-aged male resting comfortably in bed Cardiovascular: RRR, no murmurs Respiratory: Clear to auscultation bilaterally, breathing comfortable on room air Abdomen: Soft, nontender, positive bowel sounds Extremities: WWP, no edema, s/p right TMA and left toe amputation  Laboratory: Recent Labs  Lab 01/04/21 1821 01/05/21 0226 01/06/21 0540  WBC 6.6 13.9* 14.4*  HGB 5.2* 11.8* 11.3*  HCT 16.6* 35.0* 35.3*  PLT 199 385 386   Recent Labs  Lab 01/05/21 0226 01/05/21 1539 01/06/21 0540  NA 128* 130* 131*  K 4.5 4.0 4.3  CL 95* 98 98  CO2 25 25 24   BUN 32* 26* 19  CREATININE 1.44* 0.93 0.75  CALCIUM 7.8* 8.0* 8.3*  GLUCOSE 258* 203* 277*      Imaging/Diagnostic Tests: DG CHEST PORT 1 VIEW  Result Date: 01/06/2021 CLINICAL DATA:  Status post thoracotomy. EXAM: PORTABLE CHEST 1 VIEW COMPARISON:  January 05, 2021. FINDINGS: Stable cardiomediastinal silhouette. Right internal jugular catheter and right-sided PICC lines are unchanged in position. Right-sided chest tube has been removed. Minimal right apical pneumothorax is noted. Left lung is clear. Mild right basilar subsegmental atelectasis is noted with possible small right pleural effusion. IMPRESSION: Minimal right apical pneumothorax status post right-sided chest tube removal. Stable mild right basilar subsegmental atelectasis or small right pleural effusion. Electronically Signed   By: Marijo Conception M.D.   On: 01/06/2021  08:12     Zola Button, MD 01/06/2021, 7:26 AM PGY-1, [place] Intern pager: [phone], text pages welcome

## 2021-01-06 NOTE — Progress Notes (Signed)
Physical Therapy Treatment Patient Details Name: Christopher Fernandez MRN: [id number] DOB: 07-May-1971 Today's Date: 01/06/2021    History of Present Illness Christopher Fernandez is a 50 y.o. male presenting with worsening osteomyelitis and concern of new abscess/phlegmon in thoracic spine after having a repeat MRI a few days ago. MRI evidence from 12/11/2010 worsening osteomyelitis of the parasymphyseal right superior pubic ramus with improvement in right abductor minimus myositis.s/p right-sided T7 thoracotomy with vertebrectomy of T9 and T10 for osteomyelitis decompression of epidural abscess reconstruction with Exe-Cort titanium implant and lateral plate fixation with traverse plate local autograft from T7 rib.PMH is significant for history of thoracic osteomyelitis, type 2 diabetes, peripheral neuropathy, chronic pancreatitis/pancreatic insufficiency, hyperlipidemia, hypertension, asthma, seasonal allergies, GERD.    PT Comments    Pt making steady progress towards his physical therapy goals, exhibiting improved activity tolerance and ambulation distance. Pt requiring minA for transfers, ambulating x 80 feet with a walker at a min guard assist level. Desaturation to 89% on RA, so placed back on 2L O2 where he maintained 94%, HR 104-111 bpm. Will continue to progress as tolerated.     Follow Up Recommendations  Home health PT     Equipment Recommendations  Rolling walker with 5" wheels;3in1 (PT)    Recommendations for Other Services       Precautions / Restrictions Precautions Precautions: Back;Other (comment) Precaution Booklet Issued: No Restrictions Weight Bearing Restrictions: No    Mobility  Bed Mobility Overal bed mobility: Needs Assistance Bed Mobility: Rolling;Sidelying to Sit;Sit to Sidelying Rolling: Supervision Sidelying to sit: Min guard       General bed mobility comments: Cues provided for log roll technique. Pt exiting towards the left side, use of bed rail, increased time  to rise up.    Transfers Overall transfer level: Needs assistance Equipment used: Rolling walker (2 wheeled) Transfers: Sit to/from Stand Sit to Stand: Min assist         General transfer comment: MinA to rise from edge of bed, pt preferring PT to guard on L side  Ambulation/Gait Ambulation/Gait assistance: Min guard Gait Distance (Feet): 80 Feet Assistive device: Rolling walker (2 wheeled) Gait Pattern/deviations: Step-through pattern;Decreased stride length;Shuffle Gait velocity: decreased Gait velocity interpretation: <1.8 ft/sec, indicate of risk for recurrent falls General Gait Details: Good posture and proximity to walker, slow, shuffling gait speed with decreased bilateral foot clearance. Min guard for Barrister's clerk    Modified Rankin (Stroke Patients Only)       Balance Overall balance assessment: Needs assistance Sitting-balance support: No upper extremity supported;Feet supported Sitting balance-Leahy Scale: Good     Standing balance support: Bilateral upper extremity supported;During functional activity Standing balance-Leahy Scale: Poor Standing balance comment: reliant on external support                            Cognition Arousal/Alertness: Awake/alert Behavior During Therapy: WFL for tasks assessed/performed Overall Cognitive Status: Within Functional Limits for tasks assessed                                        Exercises General Exercises - Lower Extremity Long Arc Quad: Both;Seated;15 reps    General Comments        Pertinent Vitals/Pain Pain Assessment: Faces Faces Pain Scale: Hurts  even more Pain Location: surgical site Pain Descriptors / Indicators: Discomfort;Grimacing;Guarding Pain Intervention(s): Limited activity within patient's tolerance;Monitored during session    Home Living                      Prior Function            PT Goals  (current goals can now be found in the care plan section) Acute Rehab PT Goals Patient Stated Goal: less pain PT Goal Formulation: With patient Time For Goal Achievement: 01/18/21 Potential to Achieve Goals: Good Progress towards PT goals: Progressing toward goals    Frequency    Min 5X/week      PT Plan Current plan remains appropriate    Co-evaluation              AM-PAC PT "6 Clicks" Mobility   Outcome Measure  Help needed turning from your back to your side while in a flat bed without using bedrails?: A Little Help needed moving from lying on your back to sitting on the side of a flat bed without using bedrails?: A Little Help needed moving to and from a bed to a chair (including a wheelchair)?: A Little Help needed standing up from a chair using your arms (e.g., wheelchair or bedside chair)?: A Little Help needed to walk in hospital room?: A Little Help needed climbing 3-5 steps with a railing? : A Lot 6 Click Score: 17    End of Session   Activity Tolerance: Patient tolerated treatment well Patient left: with call bell/phone within reach;in chair Nurse Communication: Mobility status PT Visit Diagnosis: Unsteadiness on feet (R26.81);Muscle weakness (generalized) (M62.81)     Time: 4888-9169 PT Time Calculation (min) (ACUTE ONLY): 30 min  Charges:  $Therapeutic Activity: 23-37 mins                     Wyona Almas, PT, DPT Acute Rehabilitation Services Pager [phone] Office [phone]    [name] Etienne 01/06/2021, 10:37 AM

## 2021-01-06 NOTE — Progress Notes (Signed)
[facility] Infectious Diseases Follow Up Note  Patient Identification: Patient Name: Christopher Fernandez MRN: [id number] Admit Date: 12/27/2020  6:12 AM Age: 50 y.o.Today's Date: 01/06/2021   Reason for Visit: Follow-up on MRSA infection  Active Problems:   Osteomyelitis of second toe of left foot ([place])   Spinal epidural abscess   S/P thoracotomy   Antibiotics: Daptomycin 4/15-current                    Cefazolin 4/22-4/23  Lines/Tubes: PICC right arm 3/27, right internal jugular CVC 4/22  Interval Events: Afebrile, gradual uptrending leukocytosis, hemodynamically stable   Assessment Disseminated MRSA infection T9-T10 discitis and Osteomyelitis/surounding phlegmon  Rt 10th trib Osteomyelitis Pubic ramus osteomyelitis T9-T10 epidural abscess with myelopathy status post right-sided T7 thoracotomy with vertebrectomy, decompression of epidural abscess, reconstruction with Exe-Cort titanium and lateral plate fixation with traverse plate local autograft from T7 rib on 4/22.   Possible osteomyelitis at left second toe amputated site Smoking-counseled   Recommendations Continue IV daptomycin, monitor CPK ( 236)  Will add Rifampin given use of hardware in OR on 4/22 for biofilm.  Will need 6 to 8 weeks of IV antibiotics from date of OR on 4/22 Follow-up on susceptibilities of staph aureus from OR on 4/22 Monitor CBC and BMP on IV antibiotics  Rest of the management as per the primary team. Thank you for the consult. Please page with pertinent questions or concerns.  ______________________________________________________________________ Subjective patient seen and examined at the bedside.  Sitting in bed.  Feels overall well, denies any new complaints.   Vitals BP 118/73 (BP Location: Left Arm)   Pulse (!) 103   Temp 98.4 F (36.9 C) (Oral)   Resp 12   Ht 6\' 3"  (1.905 m)   Wt 112 kg   SpO2 94%   BMI 30.86 kg/m      Physical Exam Constitutional: Sitting in chair, not in acute distress    Comments:   Cardiovascular:     Rate and Rhythm: Normal rate and regular rhythm.     Heart sounds: No murmur heard.   Pulmonary:     Effort: Pulmonary effort is normal.     Comments: Clear lung sounds  Abdominal:     Palpations: Abdomen is soft.     Tenderness: Nontender  Musculoskeletal:        General: No swelling or tenderness.   Skin:    Comments: No obvious rashes  Neurological:     General: No focal deficit present.   Psychiatric:        Mood and Affect: Mood normal.   Pertinent Microbiology Results for orders placed or performed during the hospital encounter of 12/27/20  SARS CORONAVIRUS 2 (TAT 6-24 HRS) Nasopharyngeal Nasopharyngeal Swab     Status: None   Collection Time: 12/27/20  6:16 AM   Specimen: Nasopharyngeal Swab  Result Value Ref Range Status   SARS Coronavirus 2 NEGATIVE NEGATIVE Final    Comment: (NOTE) SARS-CoV-2 target nucleic acids are NOT DETECTED.  The SARS-CoV-2 RNA is generally detectable in upper and lower respiratory specimens during the acute phase of infection. Negative results do not preclude SARS-CoV-2 infection, do not rule out co-infections with other pathogens, and should not be used as the sole basis for treatment or other patient management decisions. Negative results must be combined with clinical observations, patient history, and epidemiological information. The expected result is Negative.  Fact Sheet for Patients: SugarRoll.be  Fact Sheet for Healthcare Providers: https://www.woods-mathews.com/  This  test is not yet approved or cleared by the [place] and  has been authorized for detection and/or diagnosis of SARS-CoV-2 by FDA under an Emergency Use Authorization (EUA). This EUA will remain  in effect (meaning this test can be used) for the duration of the COVID-19 declaration under Se ction  564(b)(1) of the Act, 21 U.S.C. section 360bbb-3(b)(1), unless the authorization is terminated or revoked sooner.  Performed at [facility], [place] [address], [place], [place] [address]   Blood culture (routine single)     Status: None   Collection Time: 12/27/20  6:29 AM   Specimen: BLOOD LEFT HAND  Result Value Ref Range Status   Specimen Description BLOOD LEFT HAND  Final   Special Requests   Final    BOTTLES DRAWN AEROBIC AND ANAEROBIC Blood Culture results may not be optimal due to an inadequate volume of blood received in culture bottles   Culture   Final    NO GROWTH 5 DAYS Performed at [facility], [place] [address], [place], [place] [address]    Report Status 01/01/2021 FINAL  Final  Urine culture     Status: Abnormal   Collection Time: 12/27/20  9:26 AM   Specimen: In/Out Cath Urine  Result Value Ref Range Status   Specimen Description IN/OUT CATH URINE  Final   Special Requests NONE  Final   Culture (A)  Final    1,000 COLONIES/mL DIPHTHEROIDS(CORYNEBACTERIUM SPECIES) Standardized susceptibility testing for this organism is not available. Performed at [facility], [place] [address], [place], [place] [address]    Report Status 12/28/2020 FINAL  Final  Surgical pcr screen     Status: None   Collection Time: 12/30/20 10:49 PM   Specimen: Nasal Mucosa; Nasal Swab  Result Value Ref Range Status   MRSA, PCR NEGATIVE NEGATIVE Final   Staphylococcus aureus NEGATIVE NEGATIVE Final    Comment: (NOTE) The Xpert SA Assay (FDA approved for NASAL specimens in patients 63 years of age and older), is one component of a comprehensive surveillance program. It is not intended to diagnose infection nor to guide or monitor treatment. Performed at [facility], [place] [address] [place], [place] [address]   Aerobic/Anaerobic Culture w Gram Stain (surgical/deep wound)     Status: None (Preliminary result)   Collection Time: 01/03/21  6:37 PM   Specimen: Wound   Result Value Ref Range Status   Specimen Description WOUND  Final   Special Requests 10 THORACIC 9  Final   Gram Stain   Final    FEW WBC PRESENT,BOTH PMN AND MONONUCLEAR NO ORGANISMS SEEN    Culture   Final    RARE STAPHYLOCOCCUS AUREUS SUSCEPTIBILITIES TO FOLLOW Performed at [facility], [place] [address] [place], [place] [address]    Report Status PENDING  Incomplete    Pertinent Lab. CBC Latest Ref Rng & Units 01/06/2021 01/05/2021 01/04/2021  WBC 4.0 - 10.5 K/uL 14.4(H) 13.9(H) 6.6  Hemoglobin 13.0 - 17.0 g/dL 11.3(L) 11.8(L) 5.2(LL)  Hematocrit 39.0 - 52.0 % 35.3(L) 35.0(L) 16.6(L)  Platelets 150 - 400 K/uL 386 385 199   CMP Latest Ref Rng & Units 01/06/2021 01/05/2021 01/05/2021  Glucose 70 - 99 mg/dL 277(H) 203(H) 258(H)  BUN 6 - 20 mg/dL 19 26(H) 32(H)  Creatinine 0.61 - 1.24 mg/dL 0.75 0.93 1.44(H)  Sodium 135 - 145 mmol/L 131(L) 130(L) 128(L)  Potassium 3.5 - 5.1 mmol/L 4.3 4.0 4.5  Chloride 98 -  111 mmol/L 98 98 95(L)  CO2 22 - 32 mmol/L 24 25 25   Calcium 8.9 - 10.3 mg/dL 8.3(L) 8.0(L) 7.8(L)  Total Protein 6.5 - 8.1 g/dL - - -  Total Bilirubin 0.3 - 1.2 mg/dL - - -  Alkaline Phos 38 - 126 U/L - - -  AST 15 - 41 U/L - - -  ALT 0 - 44 U/L - - -     Pertinent Imaging today Plain films and CT images have been personally visualized and interpreted; radiology reports have been reviewed. Decision making incorporated into the Impression / Recommendations.  Chest Xray 01/06/21 IMPRESSION: Minimal right apical pneumothorax status post right-sided chest tube removal. Stable mild right basilar subsegmental atelectasis or small right pleural effusion.  CT Chest abdomen pelvis 01/05/21 IMPRESSION: 1. Small bilateral pleural effusions with basilar atelectasis. Right chest tube is in place with minimal residual right apical pneumothorax. Subcutaneous emphysema in the right chest wall. 2. Changes of chronic pancreatitis. 3. No evidence of bowel obstruction or  inflammation. Diffusely stool-filled colon. 4. 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  I have spent approx 30 minutes for this patient encounter including review of prior medical records, coordination of care  with greater than 50% of time being face to face/counseling and discussing diagnostics/treatment plan with the patient/family.  Electronically signed by:   Rosiland Oz, MD Infectious Disease Physician [facility] Pager: [phone]

## 2021-01-06 NOTE — Progress Notes (Addendum)
      [address]       [place] [address]             [phone]      3 Days Post-Op Procedure(s) (LRB): Decompression of  Thoracic nine and Thoracic ten via corpectomy reconstruction with titanium spacer and rib autogrtaft. Lateral plate fixation Thoracic eight - Thoracic eleven (Right) THORACIC EXPOSURE with removal of rib (Right) Subjective: Resting in bed, says he had a good night. Pain is controlled.  No BM since surgery but feels like he will today.   Objective: Vital signs in last 24 hours: Temp:  [97.6 F (36.4 C)-98.9 F (37.2 C)] 98.7 F (37.1 C) (04/25 0400) Pulse Rate:  [84-109] 109 (04/25 0600) Cardiac Rhythm: Normal sinus rhythm (04/24 2000) Resp:  [14-26] 20 (04/25 0600) BP: (99-129)/(52-85) 117/78 (04/25 0600) SpO2:  [86 %-97 %] 93 % (04/25 0600) Weight:  [595 kg] 112 kg (04/25 0500)  Hemodynamic parameters for last 24 hours:    Intake/Output from previous day: 04/24 0701 - 04/25 0700 In: 1591 [I.V.:1521; IV Piggyback:70] Out: 1540 [Urine:1450; Chest Tube:90] Intake/Output this shift: No intake/output data recorded.  General appearance: alert, cooperative and no distress Heart: Sinus tachycardia in low 100's. Lungs: Breath sounds clear, O2 sats reasonable on 2LncO2. CXR this AM has not been read but appears stable, no PTX post CT removal  Wound: The right thoracotomy incision is covered with a dry Aquacel dressing. The CT exit site is dry.   Lab Results: Recent Labs    01/05/21 0226 01/06/21 0540  WBC 13.9* 14.4*  HGB 11.8* 11.3*  HCT 35.0* 35.3*  PLT 385 386   BMET:  Recent Labs    01/05/21 1539 01/06/21 0540  NA 130* 131*  K 4.0 4.3  CL 98 98  CO2 25 24  GLUCOSE 203* 277*  BUN 26* 19  CREATININE 0.93 0.75  CALCIUM 8.0* 8.3*    PT/INR: No results for input(s): LABPROT, INR in the last 72 hours. ABG    Component Value Date/Time   PHART 7.290 (L) 01/03/2021 2115   HCO3 29.8 (H) 01/03/2021 2115   TCO2 32  01/03/2021 2115   O2SAT 100.0 01/03/2021 2115   CBG (last 3)  Recent Labs    01/05/21 1203 01/05/21 1653 01/05/21 2149  GLUCAP 265* 145* 188*    Assessment/Plan: S/P Procedure(s) (LRB): Decompression of  Thoracic nine and Thoracic ten via corpectomy reconstruction with titanium spacer and rib autogrtaft. Lateral plate fixation Thoracic eight - Thoracic eleven (Right) THORACIC EXPOSURE with removal of rib (Right)  -POD-3 right thoracotomy exposure for vertebrectomy and lateral vertebral fixation for spinal osteomyelitis. Chest tube removed yesterday, CXR is stable. Respiratory status is stable. Hct is stable following transfusion. Encouraging pulmonary hygiene. Will leave the thoracotomy dressing in place one more day.    LOS: 9 days    [name] Kindle [phone] 01/06/2021   Chart reviewed, patient examined, agree with above. He is feeling better and pain controlled. Ambulated to bathroom yesterday.  CXR stable with mild bibasilar atelectasis. Continue IS.

## 2021-01-06 NOTE — Progress Notes (Addendum)
Pharmacy Antibiotic Note  Christopher Fernandez is a 50 y.o. male admitted on 12/27/2020 with back pain and worsening epidural abscess on MRI 4/13. 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 Pharmacy has been consulted to continue Daptomycin. Last CK on 4/23 elevated at 565, likely due to surgery on 4/22. Repeat CK 4/24 WNL at 365. Scr at baseline of 0.75. WBC slightly increased to 14.4, afebrile.   Plan: Continue Daptomycin 1000 mg every 24 hours for 8 additional weeks (end date 6/17) CK every Saturday Monitor renal function  Monitor signs/symptoms of worsening infection   Height: 6\' 3"  (190.5 cm) Weight: 112 kg (246 lb 14.6 oz) IBW/kg (Calculated) : 84.5  Temp (24hrs), Avg:98.3 F (36.8 C), Min:97.6 F (36.4 C), Max:98.9 F (37.2 C)  Recent Labs  Lab 01/03/21 0316 01/04/21 0505 01/04/21 1821 01/05/21 0226 01/05/21 1539 01/06/21 0540  WBC 8.8 12.2* 6.6 13.9*  --  14.4*  CREATININE 0.70 0.92  0.87  --  1.44* 0.93 0.75    Estimated Creatinine Clearance: 145.9 mL/min (by C-G formula based on SCr of 0.75 mg/dL).    Allergies  Allergen Reactions  . Eggs Or Egg-Derived Products Rash  . Morphine And Related Other (See Comments)    Cant take because of pancreatitis  . Cocoa Rash    Thank you for allowing pharmacy to be a part of this patient's care.  Benna Dunks  PharmD Candidate, Class of 2022

## 2021-01-06 NOTE — Progress Notes (Signed)
I spoke with the IV Team regarding his existing PICC line. They reviewed the XRAY and gave me the go ahead to use the existing PICC. Cap changed and line flushed. Blood return noted. Dressing due to be changed tomorrow. RIJ Central line removed Christopher Fernandez

## 2021-01-06 NOTE — Progress Notes (Signed)
Patient recommended for 4 placement of Foley due to urinary retention and multiple in and out catheterizations.  Following request for place a Foley, contacted patient's nurse with report of patient's ability to urinate and have a bowel movement.  Requested post void residual measurement. Will forego Foley placement at this time and allow for patient to void  Eulis Foster, [place], PGY-2  [place] Intern Pager [phone]

## 2021-01-06 NOTE — Progress Notes (Signed)
Pt c/o consistent cough that has some mucus production, but also painful.   Dr. Larae Grooms, FMTS on call notified, Orders to be placed

## 2021-01-06 NOTE — Progress Notes (Signed)
1450: Spoke with the doctor about patient continuing to retain urine. Order to replace foley.  1600: Encouraged patient to get up and walk to the bathroom. He was able to have a bowel movement as well as urinate. I will perform a post void residual.

## 2021-01-07 ENCOUNTER — Ambulatory Visit: Payer: 59 | Admitting: Internal Medicine

## 2021-01-07 DIAGNOSIS — K5900 Constipation, unspecified: Secondary | ICD-10-CM | POA: Diagnosis not present

## 2021-01-07 DIAGNOSIS — E1140 Type 2 diabetes mellitus with diabetic neuropathy, unspecified: Secondary | ICD-10-CM | POA: Diagnosis not present

## 2021-01-07 DIAGNOSIS — A312 Disseminated mycobacterium avium-intracellulare complex (DMAC): Secondary | ICD-10-CM | POA: Diagnosis not present

## 2021-01-07 DIAGNOSIS — Z794 Long term (current) use of insulin: Secondary | ICD-10-CM

## 2021-01-07 DIAGNOSIS — R710 Precipitous drop in hematocrit: Secondary | ICD-10-CM | POA: Diagnosis not present

## 2021-01-07 DIAGNOSIS — G061 Intraspinal abscess and granuloma: Secondary | ICD-10-CM | POA: Diagnosis not present

## 2021-01-07 LAB — CBC
HCT: 32 % — ABNORMAL LOW (ref 39.0–52.0)
Hemoglobin: 10.3 g/dL — ABNORMAL LOW (ref 13.0–17.0)
MCH: 28.6 pg (ref 26.0–34.0)
MCHC: 32.2 g/dL (ref 30.0–36.0)
MCV: 88.9 fL (ref 80.0–100.0)
Platelets: 387 10*3/uL (ref 150–400)
RBC: 3.6 MIL/uL — ABNORMAL LOW (ref 4.22–5.81)
RDW: 15.3 % (ref 11.5–15.5)
WBC: 12 10*3/uL — ABNORMAL HIGH (ref 4.0–10.5)
nRBC: 0 % (ref 0.0–0.2)

## 2021-01-07 LAB — GLUCOSE, CAPILLARY
Glucose-Capillary: 160 mg/dL — ABNORMAL HIGH (ref 70–99)
Glucose-Capillary: 161 mg/dL — ABNORMAL HIGH (ref 70–99)
Glucose-Capillary: 73 mg/dL (ref 70–99)
Glucose-Capillary: 77 mg/dL (ref 70–99)

## 2021-01-07 LAB — BASIC METABOLIC PANEL
Anion gap: 7 (ref 5–15)
BUN: 16 mg/dL (ref 6–20)
CO2: 27 mmol/L (ref 22–32)
Calcium: 8.5 mg/dL — ABNORMAL LOW (ref 8.9–10.3)
Chloride: 99 mmol/L (ref 98–111)
Creatinine, Ser: 0.85 mg/dL (ref 0.61–1.24)
GFR, Estimated: >60 mL/min (ref >60)
Glucose, Bld: 172 mg/dL — ABNORMAL HIGH (ref 70–99)
Potassium: 4.4 mmol/L (ref 3.5–5.1)
Sodium: 133 mmol/L — ABNORMAL LOW (ref 135–145)

## 2021-01-07 LAB — SURGICAL PATHOLOGY

## 2021-01-07 MED ORDER — ENOXAPARIN SODIUM 40 MG/0.4ML ~~LOC~~ SOLN
40.0000 mg | SUBCUTANEOUS | Status: DC
  Administered 2021-01-07 – 2021-01-09 (×3): 40 mg via SUBCUTANEOUS
  Filled 2021-01-07 (×3): qty 0.4

## 2021-01-07 NOTE — Progress Notes (Signed)
Patient ID: Christopher Fernandez, male   DOB: [birth year], 50 y.o.   MRN: [id number] Vital signs stable  Motor function ok  stable post op Transfer to floor.

## 2021-01-07 NOTE — Progress Notes (Signed)
[facility] Infectious Diseases Follow Up Note  Patient Identification: Patient Name: Christopher Fernandez MRN: [id number] Admit Date: 12/27/2020  6:12 AM Age: 50 y.o.Today's Date: 01/07/2021   Reason for Visit: Follow-up on MRSA infection  Active Problems:   Osteomyelitis of second toe of left foot ([place])   Spinal epidural abscess   S/P thoracotomy   Post-operative hemoglobin drop  Antibiotics: Daptomycin 4/15-current                    Cefazolin 4/22-4/23  Lines/Tubes: PICC right arm 3/27   Interval Events: Continues to be afebrile, leukocytosis is downtrending, surgical site is healing well.  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  Will defer need of repeat MRI thoracic spine near completion of IV antibiotics during OP follow up with ID given T9-T10 epidural abscess OPAT orders in Will sign off for now  Rest of the management as per the primary team. Thank you for the consult. Please page with pertinent questions or concerns.  ______________________________________________________________________ Subjective patient seen and examined at the bedside.  Sitting up in bed and having breakfast, no new complaints  Vitals BP (!) 119/103   Pulse 95    Temp 98.5 F (36.9 C) (Oral)   Resp 13   Ht 6\' 3"  (1.905 m)   Wt 111.8 kg   SpO2 97%   BMI 30.81 kg/m     Physical Exam Constitutional:   Not in acute distress, appears comfortable    Comments:   Cardiovascular:     Rate and Rhythm: Normal rate and regular rhythm.     Heart sounds: No murmur heard.   Pulmonary:     Effort: Pulmonary effort is normal.     Comments: Clear lung sounds  Abdominal:     Palpations: Abdomen is soft.     Tenderness: Nontender  Musculoskeletal:        General: No swelling or tenderness.   Skin:    Comments: Surgical site in the lateral chest right side is healing well  Neurological:     General: No focal deficit present.   Psychiatric:        Mood and Affect: Mood normal.    Pertinent Microbiology Results for orders placed or performed during the hospital encounter of 12/27/20  SARS CORONAVIRUS 2 (TAT 6-24 HRS) Nasopharyngeal Nasopharyngeal Swab     Status: None   Collection Time: 12/27/20  6:16 AM   Specimen: Nasopharyngeal Swab  Result Value Ref Range Status   SARS Coronavirus 2 NEGATIVE NEGATIVE Final    Comment: (NOTE) SARS-CoV-2 target nucleic acids are NOT DETECTED.  The SARS-CoV-2 RNA is generally detectable in upper and lower respiratory specimens during the acute phase of infection. Negative results do not preclude SARS-CoV-2 infection, do not rule out co-infections with other pathogens, and should not be used as the sole basis for treatment or other patient management decisions. Negative  results must be combined with clinical observations, patient history, and epidemiological information. The expected result is Negative.  Fact Sheet for Patients: SugarRoll.be  Fact Sheet for Healthcare Providers: https://www.woods-mathews.com/  This test is not yet approved or cleared by the [place] FDA and  has been authorized for detection and/or diagnosis of SARS-CoV-2 by FDA under an  Emergency Use Authorization (EUA). This EUA will remain  in effect (meaning this test can be used) for the duration of the COVID-19 declaration under Se ction 564(b)(1) of the Act, 21 U.S.C. section 360bbb-3(b)(1), unless the authorization is terminated or revoked sooner.  Performed at [facility], [place] [address], [place], [place] [address]   Blood culture (routine single)     Status: None   Collection Time: 12/27/20  6:29 AM   Specimen: BLOOD LEFT HAND  Result Value Ref Range Status   Specimen Description BLOOD LEFT HAND  Final   Special Requests   Final    BOTTLES DRAWN AEROBIC AND ANAEROBIC Blood Culture results may not be optimal due to an inadequate volume of blood received in culture bottles   Culture   Final    NO GROWTH 5 DAYS Performed at [facility], [place] [address], [place], [place] [address]    Report Status 01/01/2021 FINAL  Final  Urine culture     Status: Abnormal   Collection Time: 12/27/20  9:26 AM   Specimen: In/Out Cath Urine  Result Value Ref Range Status   Specimen Description IN/OUT CATH URINE  Final   Special Requests NONE  Final   Culture (A)  Final    1,000 COLONIES/mL DIPHTHEROIDS(CORYNEBACTERIUM SPECIES) Standardized susceptibility testing for this organism is not available. Performed at [facility], [place] [address], [place], [place] [address]    Report Status 12/28/2020 FINAL  Final  Surgical pcr screen     Status: None   Collection Time: 12/30/20 10:49 PM   Specimen: Nasal Mucosa; Nasal Swab  Result Value Ref Range Status   MRSA, PCR NEGATIVE NEGATIVE Final   Staphylococcus aureus NEGATIVE NEGATIVE Final    Comment: (NOTE) The Xpert SA Assay (FDA approved for NASAL specimens in patients 53 years of age and older), is one component of a comprehensive surveillance program. It is not intended to diagnose infection nor to guide or monitor treatment. Performed at [facility], [place] [address] [place], [place] [address]    Aerobic/Anaerobic Culture w Gram Stain (surgical/deep wound)     Status: None (Preliminary result)   Collection Time: 01/03/21  6:37 PM   Specimen: Wound  Result Value Ref Range Status   Specimen Description WOUND  Final   Special Requests 10 THORACIC 9  Final   Gram Stain   Final    FEW WBC PRESENT,BOTH PMN AND MONONUCLEAR NO ORGANISMS SEEN Performed at [facility], [address] [place], [place] [address]    Culture   Final    RARE METHICILLIN RESISTANT STAPHYLOCOCCUS AUREUS Sent to [facility] for further susceptibility testing. NO ANAEROBES ISOLATED; CULTURE IN PROGRESS FOR 5 DAYS    Report Status PENDING  Incomplete   Organism ID, Bacteria METHICILLIN RESISTANT STAPHYLOCOCCUS AUREUS  Final      Susceptibility   Methicillin resistant staphylococcus aureus - MIC*    CIPROFLOXACIN <=0.5 SENSITIVE Sensitive     ERYTHROMYCIN >=8 RESISTANT Resistant     GENTAMICIN <=0.5 SENSITIVE Sensitive     OXACILLIN >=4 RESISTANT Resistant     TETRACYCLINE <=1  SENSITIVE Sensitive     VANCOMYCIN 1 SENSITIVE Sensitive     TRIMETH/SULFA <=10 SENSITIVE Sensitive     CLINDAMYCIN <=0.25 SENSITIVE Sensitive     RIFAMPIN <=0.5 SENSITIVE Sensitive     Inducible Clindamycin NEGATIVE Sensitive     * RARE METHICILLIN RESISTANT STAPHYLOCOCCUS AUREUS    Pertinent Lab. CBC Latest Ref Rng & Units 01/07/2021 01/06/2021 01/05/2021  WBC 4.0 - 10.5 K/uL 12.0(H) 14.4(H) 13.9(H)  Hemoglobin 13.0 - 17.0 g/dL 10.3(L) 11.3(L) 11.8(L)  Hematocrit 39.0 - 52.0 % 32.0(L) 35.3(L) 35.0(L)  Platelets 150 - 400 K/uL 387 386 385   CMP Latest Ref Rng & Units 01/07/2021 01/06/2021 01/05/2021  Glucose 70 - 99 mg/dL 172(H) 277(H) 203(H)  BUN 6 - 20 mg/dL 16 19 26(H)  Creatinine 0.61 - 1.24 mg/dL 0.85 0.75 0.93  Sodium 135 - 145 mmol/L 133(L) 131(L) 130(L)  Potassium 3.5 - 5.1 mmol/L 4.4 4.3 4.0  Chloride 98 - 111 mmol/L 99 98 98  CO2 22 - 32 mmol/L 27 24 25   Calcium 8.9 - 10.3 mg/dL 8.5(L) 8.3(L) 8.0(L)  Total Protein  6.5 - 8.1 g/dL - - -  Total Bilirubin 0.3 - 1.2 mg/dL - - -  Alkaline Phos 38 - 126 U/L - - -  AST 15 - 41 U/L - - -  ALT 0 - 44 U/L - - -    Pertinent Imaging today Plain films and CT images have been personally visualized and interpreted; radiology reports have been reviewed. Decision making incorporated into the Impression / Recommendations.  Chest Xray 4/25 FINDINGS: Stable cardiomediastinal silhouette. Right internal jugular catheter and right-sided PICC lines are unchanged in position. Right-sided chest tube has been removed. Minimal right apical pneumothorax is noted. Left lung is clear. Mild right basilar subsegmental atelectasis is noted with possible small right pleural effusion.  IMPRESSION: Minimal right apical pneumothorax status post right-sided chest tube removal. Stable mild right basilar subsegmental atelectasis or small right pleural effusion.   I have spent approx 30 minutes for this patient encounter including review of prior medical records, coordination of care  with greater than 50% of time being face to face/counseling and discussing diagnostics/treatment plan with the patient/family.  Electronically signed by:   Rosiland Oz, MD Infectious Disease Physician [facility] for Infectious Disease Pager: [phone]

## 2021-01-07 NOTE — Progress Notes (Signed)
Family Medicine Teaching Service Daily Progress Note Intern Pager: [phone]  Patient name: Christopher Fernandez Medical record number: [id number] Date of birth: 1971-01-03 Age: 50 y.o. Gender: male  Primary Care Provider: Danna Hefty, DO Consultants: Neurosurgery, cardiothoracic surgery, ID   Code Status: Full Code  Pt Overview and Major Events to Date:  4/15: Admitted  4/22: T8-11 lateral fusion, T9-10 corpectomy, 1u pRBC 4/23: 2u pRBC 4/24: 1u pRBC  Assessment and Plan: LOPEZ DENTINGER is a 50 y.o. male with worsening osteomyelitis and concern of new abscess/phlegmon in thoracic spine now s/p decompression of the thoracic spine with corpectomy reconstruction. PMH is significant forhistory of thoracic osteomyelitis, type 2 diabetes, peripheral neuropathy, chronic pancreatitis/pancreatic insufficiency, hyperlipidemia, hypertension, asthma, seasonal allergies, GERD.  Osteomyelitis/discitiswithnew abscess/phlegmoninthoracic spine POD 4 s/p T8-11 lateral fusion and T9-10 corpectomy Pain well controlled on current regimen. - neurosurgery and CTS following, appreciate recommendation - ID following, appreciate recommendation - IV daptomycin (4/15-) - APAP 1000 mg q6h - meloxicam 15 mg daily - oxycodone 10 mg q4h - IV hydromorphone 0.5 mg q2h prn breakthrough - methocarbamol 500 mg q6h prn - duloxetine 60 mg daily - PT/OT: recommended rolling walker with 5" wheels and 3 in 1 bedside commode  Osteomyelitis of right hip Seen on most recent MRI.  Being treated with daptomycin as above. - IV daptomycin  Blood loss anemia Stable for now.  Postoperatively has received 3 units of pRBC so far.  Slightly drop in Hgb to 10.3 from 11.3 yesterday. - monitor CBC  Urinary retention Improving, was able to void spontaneously and had a PVR 0 after void yesterday. - bladder scan if patient feels unable to void  T2DM Home medications include Januvia 100 mg daily, metformin 1000 mg twice daily,  sliding scale insulin, 18 units insulin glargine in the mornings, Jardiance 25 mg daily.Also on gabapentin for neuropathy.Most recent A1c 9.1. Fasting CBG 172, required 16 units of short-acting insulin yesterday. - Lantus 18 units daily - sSSI - monitor CBG - continue gabapentin - hold empagliflozin and Januvia  HTN On amlodipine 2.5 mg at home. Soft BP overnight but normotensive this AM, 126/79. - hold amlodipine  Constipation Had BM yesterday, will continue to monitor. - Miralax  BID - docusate BID - senna BID  Chronic pancreatitis On pancrelipase 3 times daily with meals. - continue Creon TID  Asthma and history of seasonal allergies Home medications include albuterol as needed, Advair daily, Singulair, Zyrtec. -Continue home medications -Dulera per formulary   History of alcohol abuse/tobacco use disorder Patient states his last drink was about 1.5 years ago. Continues to smoke about half pack per day, previously smoked about a pack per day for 35 years. -nicotine patch as desired  Anxiety Depression Home medications include duloxetine 60 mg daily, Abilify 5 mg daily. -Continue home medications  GERD Home medications include pantoprazole 40 mg twice daily -Continue home medications  History of tremors Home medications include propranolol -Continue home propranolol  Left foot recent toe amputation Occurred on 3/23. 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 -Continue to monitor  Unintentional weight loss: Weight loss of about 30-35 pounds per patient on admission.Weight stable thus far. -daily weights -encourage diet -nutrition consulted  FEN/GI: carb modified diet PPx: LMWH  Disposition: transfer to progressive, plan for home with services when medically stable  Subjective:  NAOE.  Patient states pain is manageable with current medications.  Patient had a BM yesterday and was able to void this morning without  difficulty.  Patient reporting some cough.  Objective: Temp:  [97.9 F (36.6 C)-99.5 F (37.5 C)] 98 F (36.7 C) (04/26 0400) Pulse Rate:  [91-111] 91 (04/26 0400) Resp:  [12-22] 15 (04/26 0400) BP: (85-130)/(51-91) 94/64 (04/26 0400) SpO2:  [87 %-97 %] 97 % (04/26 0400) Weight:  [111.8 kg] 111.8 kg (04/26 0500) Physical Exam: General: Obese middle-aged male resting comfortably in bed Cardiovascular: RRR, no murmurs Respiratory: Clear to auscultation bilaterally, breathing comfortably on room air Abdomen: Soft, nontender, positive bowel sounds Extremities: WWP, no edema, s/p right TMA and left toe amputation  Laboratory: Recent Labs  Lab 01/05/21 0226 01/06/21 0540 01/07/21 0541  WBC 13.9* 14.4* 12.0*  HGB 11.8* 11.3* 10.3*  HCT 35.0* 35.3* 32.0*  PLT 385 386 387   Recent Labs  Lab 01/05/21 1539 01/06/21 0540 01/07/21 0541  NA 130* 131* 133*  K 4.0 4.3 4.4  CL 98 98 99  CO2 25 24 27   BUN 26* 19 16  CREATININE 0.93 0.75 0.85  CALCIUM 8.0* 8.3* 8.5*  GLUCOSE 203* 277* 172*      Imaging/Diagnostic Tests: No new imaging.   Zola Button, MD 01/07/2021, 7:40 AM PGY-1, [place] Intern pager: [phone], text pages welcome

## 2021-01-07 NOTE — Progress Notes (Signed)
Occupational Therapy Re-evaluation Patient Details Name: Christopher Fernandez MRN: [id number] DOB: [birth year] Today's Date: 01/07/2021    History of present illness 50 y.o. male presenting with worsening osteomyelitis and concern of new abscess/phlegmon in thoracic spine after having a repeat MRI a few days ago. MRI evidence from 12/11/2010 worsening osteomyelitis of the parasymphyseal right superior pubic ramus with improvement in right abductor minimus myositis.s/p right-sided T7 thoracotomy with vertebrectomy of T9 and T10 for osteomyelitis decompression of epidural abscess reconstruction with Exe-Cort titanium implant and lateral plate fixation with traverse plate local autograft from T7 rib.PMH is significant for history of thoracic osteomyelitis, type 2 diabetes, peripheral neuropathy, chronic pancreatitis/pancreatic insufficiency, hyperlipidemia, hypertension, asthma, seasonal allergies, GERD.   OT comments  Pt progressing towards established OT goals. Re-evaluation s/p T7-T10 sx. Pt performing oral care and functional mobility with RW at [place] level. Pt presenting with decreased strength, balance, and activity tolerance with reported fatigue. Pt able to perform figure four position in preparation for LB dressing activities. Pt reports he would like to "get back to walking." Pt would benefit from further acute OT to increase safety and independence with ADLs. Recommend dc to home with HHOT to optimize safety, independence, and return to PLOF.   Follow Up Recommendations  Home health OT;Other (comment) (Pending progress, may not need HHOT)    Equipment Recommendations  3 in 1 bedside commode    Recommendations for Other Services PT consult    Precautions / Restrictions Precautions Precautions: Back Precaution Booklet Issued: No Precaution Comments: Reviewed back precautions Other Brace: Has a darco shoe from previous admission (approx 3 wks ago); Darco shoe is not in room, and pt was  noncomital re: if MD officially cleared him to walk without it; Has been consistently walking without it so far this admission Restrictions Weight Bearing Restrictions: No       Mobility Bed Mobility Overal bed mobility: Needs Assistance Bed Mobility: Rolling;Sit to Sidelying Rolling: Supervision Sidelying to sit: Supervision     Sit to sidelying: Min assist General bed mobility comments: Assistance to maintain sidelying position and elevating BLEs.    Transfers Overall transfer level: Needs assistance Equipment used: Rolling walker (2 wheeled) Transfers: Sit to/from Stand Sit to Stand: Min guard         General transfer comment: MIn Guard A for safety    Balance Overall balance assessment: Needs assistance Sitting-balance support: No upper extremity supported;Feet supported Sitting balance-Leahy Scale: Good     Standing balance support: Bilateral upper extremity supported;During functional activity Standing balance-Leahy Scale: Fair Standing balance comment: Able to maintain static standing without UE support                           ADL either performed or assessed with clinical judgement   ADL Overall ADL's : Needs assistance/impaired Eating/Feeding: Independent   Grooming: Oral care;Min guard Grooming Details (indicate cue type and reason): educating pt on use of cup for rinsing/spitting to prevent forward bending. pt demonstrated understanding Upper Body Bathing: Supervision/ safety;Set up;Sitting   Lower Body Bathing: Min guard;Sit to/from stand   Upper Body Dressing : Set up;Supervision/safety   Lower Body Dressing: Min guard;Sit to/from stand Lower Body Dressing Details (indicate cue type and reason): Pt able to perform figure four position Toilet Transfer: Min guard;Ambulation;RW (simulated in room) Toilet Transfer Details (indicate cue type and reason): Min Guard A for safety         Functional mobility during  ADLs: Min guard;Rolling  walker General ADL Comments: Pt performing grooming, LB dressing (simulated) and functional mobility with Min Guard A. Pt reporting fatigue with little actiivty demonstrating poor activity tolerance. PTA, pt was living with his wife and performing BADLs with use of SPC. Reports he would perform IADLs but fatigued quickly.      Vision       Perception     Praxis      Cognition Arousal/Alertness: Awake/alert Behavior During Therapy: WFL for tasks assessed/performed Overall Cognitive Status: Within Functional Limits for tasks assessed                                          Exercises    Shoulder Instructions       General Comments SpO2 86% on RA at end of session. Placed back on 2L while supine in bed and pt elevated back to >89%. Notified RN    Pertinent Vitals/ Pain       Pain Assessment: Faces Faces Pain Scale: Hurts even more Pain Location: surgical site Pain Descriptors / Indicators: Discomfort;Grimacing;Guarding Pain Intervention(s): Monitored during session;Limited activity within patient's tolerance;Repositioned  Home Living                                          Prior Functioning/Environment              Frequency  Min 2X/week        Progress Toward Goals  OT Goals(current goals can now be found in the care plan section)  Progress towards OT goals: Progressing toward goals  Acute Rehab OT Goals Patient Stated Goal: less pain OT Goal Formulation: With patient Time For Goal Achievement: 01/21/21 Potential to Achieve Goals: Good ADL Goals Pt Will Perform Grooming: with modified independence;standing Pt Will Perform Lower Body Bathing: with modified independence;sit to/from stand Pt Will Perform Lower Body Dressing: sit to/from stand;with modified independence Pt Will Transfer to Toilet: with modified independence;ambulating Pt Will Perform Tub/Shower Transfer: Tub transfer;with modified independence;ambulating;3  in 1;rolling walker  Plan Discharge plan remains appropriate    Co-evaluation                 AM-PAC OT "6 Clicks" Daily Activity     Outcome Measure   Help from another person eating meals?: None Help from another person taking care of personal grooming?: A Little Help from another person toileting, which includes using toliet, bedpan, or urinal?: A Little Help from another person bathing (including washing, rinsing, drying)?: A Little Help from another person to put on and taking off regular upper body clothing?: A Little Help from another person to put on and taking off regular lower body clothing?: A Little 6 Click Score: 19    End of Session Equipment Utilized During Treatment: Rolling walker  OT Visit Diagnosis: Other abnormalities of gait and mobility (R26.89);Pain Pain - part of body:  (Back)   Activity Tolerance Patient tolerated treatment well   Patient Left in bed;with call bell/phone within reach;with bed alarm set   Nurse Communication Mobility status        Time: 9562-1308 OT Time Calculation (min): 20 min  Charges: OT General Charges $OT Visit: 1 Visit OT Evaluation $OT Re-eval: 1 Re-eval  [place], OTR/L Acute Rehab Pager: [phone] Office:  [place] 01/07/2021, 1:47 PM

## 2021-01-07 NOTE — Progress Notes (Signed)
Diagnosis: Disseminated MRSA infection   Culture Result: MRSA  Allergies  Allergen Reactions  . Eggs Or Egg-Derived Products Rash  . Morphine And Related Other (See Comments)    Cant take because of pancreatitis  . Cocoa Rash    OPAT Orders Discharge antibiotics to be given via PICC line Discharge antibiotics: Da[tomycin and Rifampin  Per pharmacy protocol   Duration: 6 to 8 weeks  End Date: 02/14/21 [facility] Care Per Protocol:  Home health RN for IV administration and teaching; PICC line care and labs.    Labs weekly while on IV antibiotics: X__ CBC with differential __ BMP X__ CMP X__ CRP X__ ESR __ Vancomycin trough X__ CK  __ Please pull PIC at completion of IV antibiotics X__ Please leave PIC in place until doctor has seen patient or been notified  Fax weekly labs to [phone]  Clinic Follow Up Appt: 5/12  _0 :45 am

## 2021-01-07 NOTE — Op Note (Addendum)
CARDIOTHORACIC SURGERY OPERATIVE NOTE 01/03/2021 Christopher Fernandez [id number]  Surgeon: Gaye Pollack, MD  First Assistant: none   Preoperative Diagnosis: T9-T10 spinal osteomyelitis  Postoperative Diagnosis: Same   Procedure:   1. 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 He remains neuro intact. Plan right thoracotomy for spinal exposure this afternoon with Dr. Ellene Route. I discussed thoracotomy with the patient, indications, benefits and risks and he agrees to proceed.    Preparation:  The patient was seen in the preoperative holding area and the correct patient, correct operation were confirmed with the patient after reviewing the medical record and xrays. The right side of the chest was signed by me. The consent was signed by me. Preoperative antibiotics were given. A radial arterial line was placed by the anesthesia team. The patient was taken back to the operating room and positioned supine on the operating room table. After being placed under general endotracheal anesthesia by the anesthesia team using a double lumen tube a foley catheter was placed. Lower extremity SCD's were placed. A time out was taken and the correct patient, operation and operative side were confirmed with nursing and anesthesia staff. The patient was turned into the left lateral decubitus position with the right side up. The chest was prepped draped in the usual sterile manner. A surgical time-out was taken and the correct patient and operative procedure and operative side were confirmed with the nursing and anesthesia staff.    Right thoracotomy:  The chest was entered through a lateral thoracotomy incision. The pleural space was entered through the 7th ICS after removing the 7th rib to use as a bone graft. The spine procedure was then performed by Dr. Ellene Route.  Completion:  At  the conclusion of the spine procedure I returned to the OR and scrubbed back in. There was good hemostasis. The lung had no apparent injuries.  A 28 F chest tubes was placed in the pleural space posteriorly up to the apex. The ribs were reapproximated with # 2 vicryl pericostal sutures. The serratus and latissimus muscles were approximated with continuous #1 Vicryl suture.The subcutaneous tissue was closed with 2-0 vicryl suture and the skin with 3-0 vicryl subcuticular suture. The sponge, needle, and instrument counts were correct according to the nurses. Dry sterile dressings were applied and the chest tube was connected to pleurevac suction. The patient was turned into the supine position, extubated, and transferred to the PACU in satisfactory and stable condition.

## 2021-01-07 NOTE — Progress Notes (Signed)
Physical Therapy Treatment Patient Details Name: Christopher Fernandez MRN: [id number] DOB: 1971/07/17 Today's Date: 01/07/2021    History of Present Illness Christopher Fernandez is a 50 y.o. male presenting with worsening osteomyelitis and concern of new abscess/phlegmon in thoracic spine after having a repeat MRI a few days ago. MRI evidence from 12/11/2010 worsening osteomyelitis of the parasymphyseal right superior pubic ramus with improvement in right abductor minimus myositis.s/p right-sided T7 thoracotomy with vertebrectomy of T9 and T10 for osteomyelitis decompression of epidural abscess reconstruction with Exe-Cort titanium implant and lateral plate fixation with traverse plate local autograft from T7 rib.PMH is significant for history of thoracic osteomyelitis, type 2 diabetes, peripheral neuropathy, chronic pancreatitis/pancreatic insufficiency, hyperlipidemia, hypertension, asthma, seasonal allergies, GERD.    PT Comments    Pt progressing steadily towards his physical therapy goals, ambulating x 120 ft with a walker at a min guard assist level. Continues with slow, shuffling gait pattern and requires cueing for activity pacing. D/c plan remains appropriate.     Follow Up Recommendations  Home health PT     Equipment Recommendations  Rolling walker with 5" wheels;3in1 (PT)    Recommendations for Other Services       Precautions / Restrictions Precautions Precautions: Back Precaution Booklet Issued: No Restrictions Weight Bearing Restrictions: No    Mobility  Bed Mobility Overal bed mobility: Needs Assistance Bed Mobility: Rolling;Sidelying to Sit;Sit to Sidelying Rolling: Supervision Sidelying to sit: Supervision       General bed mobility comments: Pt exiting towards the left side, use of bed rail, increased time to rise up.    Transfers Overall transfer level: Needs assistance Equipment used: Rolling walker (2 wheeled) Transfers: Sit to/from Stand Sit to Stand: Min  assist         General transfer comment: Light minA to steady upon initial stand, pt preferring PT to guard on L side  Ambulation/Gait Ambulation/Gait assistance: Min guard Gait Distance (Feet): 120 Feet Assistive device: Rolling walker (2 wheeled) Gait Pattern/deviations: Step-through pattern;Decreased stride length;Shuffle Gait velocity: decreased Gait velocity interpretation: <1.8 ft/sec, indicate of risk for recurrent falls General Gait Details: Good posture and proximity to walker, slow, shuffling gait speed with decreased bilateral foot clearance. Min guard for safety. Required one standing rest break   Stairs             Wheelchair Mobility    Modified Rankin (Stroke Patients Only)       Balance Overall balance assessment: Needs assistance Sitting-balance support: No upper extremity supported;Feet supported Sitting balance-Leahy Scale: Good     Standing balance support: Bilateral upper extremity supported;During functional activity Standing balance-Leahy Scale: Poor Standing balance comment: reliant on external support                            Cognition Arousal/Alertness: Awake/alert Behavior During Therapy: WFL for tasks assessed/performed Overall Cognitive Status: Within Functional Limits for tasks assessed                                        Exercises General Exercises - Lower Extremity Heel Slides: Both;10 reps;Supine    General Comments        Pertinent Vitals/Pain Pain Assessment: Faces Faces Pain Scale: Hurts even more Pain Location: surgical site Pain Descriptors / Indicators: Discomfort;Grimacing;Guarding Pain Intervention(s): Monitored during session    Home Living  Prior Function            PT Goals (current goals can now be found in the care plan section) Acute Rehab PT Goals Patient Stated Goal: less pain PT Goal Formulation: With patient Time For Goal  Achievement: 01/18/21 Potential to Achieve Goals: Good Progress towards PT goals: Progressing toward goals    Frequency    Min 5X/week      PT Plan Current plan remains appropriate    Co-evaluation              AM-PAC PT "6 Clicks" Mobility   Outcome Measure  Help needed turning from your back to your side while in a flat bed without using bedrails?: A Little Help needed moving from lying on your back to sitting on the side of a flat bed without using bedrails?: A Little Help needed moving to and from a bed to a chair (including a wheelchair)?: A Little Help needed standing up from a chair using your arms (e.g., wheelchair or bedside chair)?: A Little Help needed to walk in hospital room?: A Little Help needed climbing 3-5 steps with a railing? : A Lot 6 Click Score: 17    End of Session   Activity Tolerance: Patient tolerated treatment well Patient left: with call bell/phone within reach;in chair Nurse Communication: Mobility status PT Visit Diagnosis: Unsteadiness on feet (R26.81);Muscle weakness (generalized) (M62.81)     Time: 6812-7517 PT Time Calculation (min) (ACUTE ONLY): 25 min  Charges:  $Therapeutic Activity: 23-37 mins                     Wyona Almas, PT, DPT Acute Rehabilitation Services Pager [phone] Office [phone]    [name] Etienne 01/07/2021, 1:25 PM

## 2021-01-07 NOTE — Progress Notes (Addendum)
      [address]       [place] [address]             [phone]      4 Days Post-Op Procedure(s) (LRB): Decompression of  Thoracic nine and Thoracic ten via corpectomy reconstruction with titanium spacer and rib autogrtaft. Lateral plate fixation Thoracic eight - Thoracic eleven (Right) THORACIC EXPOSURE with removal of rib (Right) Subjective:  Feels he is progressing, walked in the hall with PT yesterday. Pain is controlled.  BM last evening.    Objective: Vital signs in last 24 hours: Temp:  [97.9 F (36.6 C)-99.5 F (37.5 C)] 98 F (36.7 C) (04/26 0400) Pulse Rate:  [91-111] 91 (04/26 0400) Cardiac Rhythm: Normal sinus rhythm (04/25 2000) Resp:  [12-22] 15 (04/26 0400) BP: (85-130)/(51-91) 94/64 (04/26 0400) SpO2:  [87 %-97 %] 97 % (04/26 0400) Weight:  [111.8 kg] 111.8 kg (04/26 0500)    Intake/Output from previous day: 04/25 0701 - 04/26 0700 In: 6.3 [IV Piggyback:6.3] Out: 750 [Urine:750] Intake/Output this shift: No intake/output data recorded.  General appearance: alert, cooperative and no distress Heart: RRR, NSR on monitor.  Lungs: Breath sounds clear, O2 sats reasonable on 2LncO2. Wound: The Aquacel dressing was removed, right thoracotomy incision is well approximated and dry.    Lab Results: Recent Labs    01/06/21 0540 01/07/21 0541  WBC 14.4* 12.0*  HGB 11.3* 10.3*  HCT 35.3* 32.0*  PLT 386 387   BMET:  Recent Labs    01/06/21 0540 01/07/21 0541  NA 131* 133*  K 4.3 4.4  CL 98 99  CO2 24 27  GLUCOSE 277* 172*  BUN 19 16  CREATININE 0.75 0.85  CALCIUM 8.3* 8.5*    PT/INR: No results for input(s): LABPROT, INR in the last 72 hours. ABG    Component Value Date/Time   PHART 7.290 (L) 01/03/2021 2115   HCO3 29.8 (H) 01/03/2021 2115   TCO2 32 01/03/2021 2115   O2SAT 100.0 01/03/2021 2115   CBG (last 3)  Recent Labs    01/06/21 1120 01/06/21 1816 01/06/21 2051  GLUCAP 204* 185* 152*     Assessment/Plan: S/P Procedure(s) (LRB): Decompression of  Thoracic nine and Thoracic ten via corpectomy reconstruction with titanium spacer and rib autogrtaft. Lateral plate fixation Thoracic eight - Thoracic eleven (Right) THORACIC EXPOSURE with removal of rib (Right)  -POD-4 right thoracotomy exposure for vertebrectomy and lateral vertebral fixation for spinal osteomyelitis.  Respiratory status is stable and the incision is healing with no sign of complication.  Hct is stable following transfusion. Continue working on incentive spirometry, O2 wean,  and mobility.  OK to transfer out of ICU from CT surgery standpoint.    LOS: 10 days    [name] Kindle [phone] 01/07/2021    Chart reviewed, patient examined, agree with above. He is doing well overall. Pain seem under control. Incision ok.

## 2021-01-08 ENCOUNTER — Inpatient Hospital Stay (HOSPITAL_COMMUNITY): Payer: 59

## 2021-01-08 ENCOUNTER — Other Ambulatory Visit: Payer: Self-pay | Admitting: *Deleted

## 2021-01-08 DIAGNOSIS — R710 Precipitous drop in hematocrit: Secondary | ICD-10-CM | POA: Diagnosis not present

## 2021-01-08 DIAGNOSIS — M4716 Other spondylosis with myelopathy, lumbar region: Secondary | ICD-10-CM | POA: Diagnosis not present

## 2021-01-08 DIAGNOSIS — K5900 Constipation, unspecified: Secondary | ICD-10-CM | POA: Diagnosis not present

## 2021-01-08 DIAGNOSIS — B9562 Methicillin resistant Staphylococcus aureus infection as the cause of diseases classified elsewhere: Secondary | ICD-10-CM | POA: Diagnosis not present

## 2021-01-08 DIAGNOSIS — J9811 Atelectasis: Secondary | ICD-10-CM | POA: Diagnosis not present

## 2021-01-08 DIAGNOSIS — J90 Pleural effusion, not elsewhere classified: Secondary | ICD-10-CM | POA: Diagnosis not present

## 2021-01-08 DIAGNOSIS — K861 Other chronic pancreatitis: Secondary | ICD-10-CM | POA: Diagnosis not present

## 2021-01-08 DIAGNOSIS — J9601 Acute respiratory failure with hypoxia: Secondary | ICD-10-CM | POA: Diagnosis not present

## 2021-01-08 DIAGNOSIS — E1169 Type 2 diabetes mellitus with other specified complication: Secondary | ICD-10-CM | POA: Diagnosis not present

## 2021-01-08 DIAGNOSIS — Z9889 Other specified postprocedural states: Secondary | ICD-10-CM

## 2021-01-08 DIAGNOSIS — Z20822 Contact with and (suspected) exposure to covid-19: Secondary | ICD-10-CM | POA: Diagnosis not present

## 2021-01-08 DIAGNOSIS — A4902 Methicillin resistant Staphylococcus aureus infection, unspecified site: Secondary | ICD-10-CM | POA: Diagnosis not present

## 2021-01-08 DIAGNOSIS — R0602 Shortness of breath: Secondary | ICD-10-CM

## 2021-01-08 DIAGNOSIS — M869 Osteomyelitis, unspecified: Secondary | ICD-10-CM | POA: Diagnosis not present

## 2021-01-08 DIAGNOSIS — Z9689 Presence of other specified functional implants: Secondary | ICD-10-CM | POA: Diagnosis not present

## 2021-01-08 DIAGNOSIS — I517 Cardiomegaly: Secondary | ICD-10-CM | POA: Diagnosis not present

## 2021-01-08 DIAGNOSIS — K219 Gastro-esophageal reflux disease without esophagitis: Secondary | ICD-10-CM | POA: Diagnosis not present

## 2021-01-08 DIAGNOSIS — M4624 Osteomyelitis of vertebra, thoracic region: Secondary | ICD-10-CM | POA: Diagnosis not present

## 2021-01-08 DIAGNOSIS — G062 Extradural and subdural abscess, unspecified: Secondary | ICD-10-CM | POA: Diagnosis not present

## 2021-01-08 DIAGNOSIS — N400 Enlarged prostate without lower urinary tract symptoms: Secondary | ICD-10-CM | POA: Diagnosis not present

## 2021-01-08 DIAGNOSIS — Z794 Long term (current) use of insulin: Secondary | ICD-10-CM | POA: Diagnosis not present

## 2021-01-08 DIAGNOSIS — D649 Anemia, unspecified: Secondary | ICD-10-CM | POA: Diagnosis not present

## 2021-01-08 DIAGNOSIS — M4620 Osteomyelitis of vertebra, site unspecified: Secondary | ICD-10-CM | POA: Diagnosis not present

## 2021-01-08 DIAGNOSIS — G061 Intraspinal abscess and granuloma: Secondary | ICD-10-CM | POA: Diagnosis not present

## 2021-01-08 DIAGNOSIS — D62 Acute posthemorrhagic anemia: Secondary | ICD-10-CM | POA: Diagnosis not present

## 2021-01-08 DIAGNOSIS — E1140 Type 2 diabetes mellitus with diabetic neuropathy, unspecified: Secondary | ICD-10-CM | POA: Diagnosis not present

## 2021-01-08 DIAGNOSIS — J449 Chronic obstructive pulmonary disease, unspecified: Secondary | ICD-10-CM | POA: Diagnosis not present

## 2021-01-08 DIAGNOSIS — R21 Rash and other nonspecific skin eruption: Secondary | ICD-10-CM | POA: Diagnosis not present

## 2021-01-08 LAB — BASIC METABOLIC PANEL
Anion gap: 6 (ref 5–15)
BUN: 11 mg/dL (ref 6–20)
CO2: 29 mmol/L (ref 22–32)
Calcium: 8.4 mg/dL — ABNORMAL LOW (ref 8.9–10.3)
Chloride: 99 mmol/L (ref 98–111)
Creatinine, Ser: 0.74 mg/dL (ref 0.61–1.24)
GFR, Estimated: >60 mL/min (ref >60)
Glucose, Bld: 94 mg/dL (ref 70–99)
Potassium: 3.9 mmol/L (ref 3.5–5.1)
Sodium: 134 mmol/L — ABNORMAL LOW (ref 135–145)

## 2021-01-08 LAB — CBC
HCT: 31 % — ABNORMAL LOW (ref 39.0–52.0)
Hemoglobin: 9.9 g/dL — ABNORMAL LOW (ref 13.0–17.0)
MCH: 28.4 pg (ref 26.0–34.0)
MCHC: 31.9 g/dL (ref 30.0–36.0)
MCV: 89.1 fL (ref 80.0–100.0)
Platelets: 415 10*3/uL — ABNORMAL HIGH (ref 150–400)
RBC: 3.48 MIL/uL — ABNORMAL LOW (ref 4.22–5.81)
RDW: 15.4 % (ref 11.5–15.5)
WBC: 12 10*3/uL — ABNORMAL HIGH (ref 4.0–10.5)
nRBC: 0 % (ref 0.0–0.2)

## 2021-01-08 LAB — GLUCOSE, CAPILLARY
Glucose-Capillary: 98 mg/dL (ref 70–99)
Glucose-Capillary: 116 mg/dL — ABNORMAL HIGH (ref 70–99)
Glucose-Capillary: 168 mg/dL — ABNORMAL HIGH (ref 70–99)
Glucose-Capillary: 156 mg/dL — ABNORMAL HIGH (ref 70–99)

## 2021-01-08 LAB — CK: Total CK: 120 U/L (ref 49–397)

## 2021-01-08 MED ORDER — DOXYCYCLINE HYCLATE 100 MG PO TABS
100.0000 mg | ORAL_TABLET | Freq: Two times a day (BID) | ORAL | Status: DC
  Administered 2021-01-08 – 2021-01-12 (×8): 100 mg via ORAL
  Filled 2021-01-08 (×10): qty 1

## 2021-01-08 MED ORDER — BENZONATATE 100 MG PO CAPS
100.0000 mg | ORAL_CAPSULE | Freq: Once | ORAL | Status: AC
  Administered 2021-01-08: 100 mg via ORAL
  Filled 2021-01-08: qty 1

## 2021-01-08 MED ORDER — INSULIN ASPART 100 UNIT/ML ~~LOC~~ SOLN
0.0000 [IU] | Freq: Three times a day (TID) | SUBCUTANEOUS | Status: DC
  Administered 2021-01-08: 2 [IU] via SUBCUTANEOUS
  Administered 2021-01-09: 1 [IU] via SUBCUTANEOUS
  Administered 2021-01-09: 2 [IU] via SUBCUTANEOUS
  Administered 2021-01-09: 1 [IU] via SUBCUTANEOUS

## 2021-01-08 MED ORDER — IOHEXOL 350 MG/ML SOLN
100.0000 mL | Freq: Once | INTRAVENOUS | Status: AC | PRN
  Administered 2021-01-08: 45 mL via INTRAVENOUS

## 2021-01-08 NOTE — Patient Outreach (Signed)
[place] [facility]) Care Management  01/08/2021  Christopher Fernandez May 10, 1971 [id number]   Transition of Care  Referral received:11/29/20 Initial outreach:12/12/20 Insurance: [employer]   Subjective: Patient active with [facility] care management transition of care  with less than [facility] readmission to [facility] on 12/27/20, Dx: Spinal epidural abscess. Patient has been readmitted greater than 10 days.    Plan Will close case and await discharge disposition for transition of care . Will alert Hospital Liaison of case closure at this time.    Joylene Draft, RN, BSN  [place] Management Coordinator  [phone]- Mobile [phone]- Toll Free Main Office

## 2021-01-08 NOTE — Progress Notes (Signed)
Patient ID: Christopher Fernandez, male   DOB: 03/27/1971, 50 y.o.   MRN: [id number] Vital signs are stable Patient complains of some shortness of breath particularly at night with a deep cough that he cannot seem to mobilize Still requiring some supplemental oxygen Encourage patient to work with incentive spirometer See note from thoracic surgery we will check chest x-ray. Neurologically he is doing well He can transfer to the floor when beds available.

## 2021-01-08 NOTE — Progress Notes (Signed)
Physical Therapy Treatment Patient Details Name: Christopher Fernandez MRN: [id number] DOB: Oct 16, 1970 Today's Date: 01/08/2021    History of Present Illness Christopher Fernandez is a 50 y.o. male presenting with worsening osteomyelitis and concern of new abscess/phlegmon in thoracic spine after having a repeat MRI a few days ago. MRI evidence from 12/11/2010 worsening osteomyelitis of the parasymphyseal right superior pubic ramus with improvement in right abductor minimus myositis.s/p right-sided T7 thoracotomy with vertebrectomy of T9 and T10 for osteomyelitis decompression of epidural abscess reconstruction with Exe-Cort titanium implant and lateral plate fixation with traverse plate local autograft from T7 rib.PMH is significant for history of thoracic osteomyelitis, type 2 diabetes, peripheral neuropathy, chronic pancreatitis/pancreatic insufficiency, hyperlipidemia, hypertension, asthma, seasonal allergies, GERD.    PT Comments    Pt making excellent progress towards his physical therapy goals; exhibiting improved activity tolerance and ambulation distance today in addition to requiring less assist with mobility. Pt received in bathroom with RN: SpO2 89-92% on RA. Placed on 1L O2 for walk, noted desaturation to 71% (unsure of accuracy?) halfway through. Placed pt on 4L O2 and instructed on rest break and pursed lip breathing with subsequent rebound to 93-94%. Pt ambulating x 150 feet with a walker, demonstrating improved stride length and foot clearance. D/c plan remains appropriate.    Follow Up Recommendations  Home health PT     Equipment Recommendations  Rolling walker with 5" wheels;3in1 (PT)    Recommendations for Other Services       Precautions / Restrictions Precautions Precautions: Back;Other (comment) Precaution Booklet Issued: No Precaution Comments: Watch O2 Restrictions Weight Bearing Restrictions: No    Mobility  Bed Mobility Overal bed mobility: Modified Independent                   Transfers Overall transfer level: Needs assistance Equipment used: Rolling walker (2 wheeled) Transfers: Sit to/from Stand Sit to Stand: Supervision         General transfer comment: Standing from toilet and edge of bed without physical assist  Ambulation/Gait Ambulation/Gait assistance: Min guard;Supervision Gait Distance (Feet): 150 Feet Assistive device: Rolling walker (2 wheeled) Gait Pattern/deviations: Step-through pattern;Decreased stride length;Shuffle Gait velocity: decreased Gait velocity interpretation: <1.8 ft/sec, indicate of risk for recurrent falls General Gait Details: Min cues for hip extension, otherwise demonstrating good posture, improved bilateral foot clearance. Cues for one standing rest break with desaturation   Stairs             Wheelchair Mobility    Modified Rankin (Stroke Patients Only)       Balance Overall balance assessment: Needs assistance Sitting-balance support: No upper extremity supported;Feet supported Sitting balance-Leahy Scale: Good     Standing balance support: Bilateral upper extremity supported;During functional activity Standing balance-Leahy Scale: Poor Standing balance comment: reliant on external support                            Cognition Arousal/Alertness: Awake/alert Behavior During Therapy: WFL for tasks assessed/performed Overall Cognitive Status: Within Functional Limits for tasks assessed                                        Exercises      General Comments        Pertinent Vitals/Pain Pain Assessment: Faces Faces Pain Scale: Hurts little more Pain Location: surgical site Pain Descriptors / Indicators:  Discomfort;Grimacing;Guarding Pain Intervention(s): Monitored during session    Home Living                      Prior Function            PT Goals (current goals can now be found in the care plan section) Acute Rehab PT Goals Patient  Stated Goal: less pain PT Goal Formulation: With patient Time For Goal Achievement: 01/18/21 Potential to Achieve Goals: Good Progress towards PT goals: Progressing toward goals    Frequency    Min 5X/week      PT Plan Current plan remains appropriate    Co-evaluation              AM-PAC PT "6 Clicks" Mobility   Outcome Measure  Help needed turning from your back to your side while in a flat bed without using bedrails?: None Help needed moving from lying on your back to sitting on the side of a flat bed without using bedrails?: None Help needed moving to and from a bed to a chair (including a wheelchair)?: A Little Help needed standing up from a chair using your arms (e.g., wheelchair or bedside chair)?: A Little Help needed to walk in hospital room?: A Little Help needed climbing 3-5 steps with a railing? : A Lot 6 Click Score: 19    End of Session Equipment Utilized During Treatment: Oxygen Activity Tolerance: Patient tolerated treatment well Patient left: with call bell/phone within reach;in bed Nurse Communication: Mobility status PT Visit Diagnosis: Unsteadiness on feet (R26.81);Muscle weakness (generalized) (M62.81)     Time: 9604-5409 PT Time Calculation (min) (ACUTE ONLY): 22 min  Charges:  $Gait Training: 8-22 mins                     Wyona Almas, PT, DPT Acute Rehabilitation Services Pager [phone] Office [phone]    [name] Etienne 01/08/2021, 8:39 AM

## 2021-01-08 NOTE — Progress Notes (Signed)
Spoke with infectious disease regarding patient's worsening cough, chills, and CT findings which include a loculated right pleural effusion.  Per infectious disease, recommend broadening antibiotics by adding doxycycline.  They recommended continuing with the daptomycin as well as the rifampin.  They did recommend that should he spike a fever or have any other clinical change to get blood cultures.  I informed her that we had actually already ordered these and she was in agreement with that.  She agreed that this would likely require some sort of procedure and I discussed with her that we had already spoken to thoracic surgery who will evaluate the patient as well. Greatly appreciative of the experience and expertise of infectious disease in this patient's care.

## 2021-01-08 NOTE — Progress Notes (Signed)
Family Medicine Teaching Service Daily Progress Note Intern Pager: [phone]  Patient name: Christopher Fernandez Medical record number: [id number] Date of birth: 1971-09-06 Age: 50 y.o. Gender: male  Primary Care Provider: Danna Hefty, DO Consultants: Neurosurgery, cardiothoracic surgery, ID   Code Status: Full Code  Pt Overview and Major Events to Date:  4/15: Admitted  4/22: T8-11 lateral fusion, T9-10 corpectomy, 1u pRBC 4/23: 2u pRBC 4/24: 1u pRBC  Assessment and Plan: Christopher Fernandez is a 50 y.o. male with worsening osteomyelitis and concern of new abscess/phlegmon in thoracic spine now s/p decompression of the thoracic spine with corpectomy reconstruction. PMH is significant forhistory of thoracic osteomyelitis, type 2 diabetes, peripheral neuropathy, chronic pancreatitis/pancreatic insufficiency, hyperlipidemia, hypertension, asthma, seasonal allergies, GERD.  Osteomyelitis/discitiswithnew abscess/phlegmoninthoracic spine POD 4 s/p T8-11 lateral fusion and T9-10 corpectomy Pain well controlled on current regimen. Will determine if PICC needs to be replaced prior to discharge as it has been in place since 3/27. - neurosurgery and CTS following, appreciate recommendation - ID following, appreciate recommendation - IV daptomycin (4/15-) - IV rifampin (4/25-) - APAP 1000 mg q6h - meloxicam 15 mg daily - oxycodone 10 mg q4h - IV hydromorphone 0.5 mg q2h prn breakthrough - methocarbamol 500 mg q6h prn - duloxetine 60 mg daily - PT/OT: recommended rolling walker with 5" wheels and 3 in 1 bedside commode  Acute hypoxemic respiratory failure Patient has been on 2 L of nasal cannula since his procedure on 4/22.  Having some dyspnea and deep cough particularly at night. Maintaining good SpO2 on 2L [place].  Concern for PE or pneumonia given recent surgery.  Will assess for PE given recent surgery and has been off VTE ppx for a few days given blood loss anemia.  Patient afebrile with mild  leukocytosis of 12, unchanged from previous. - wean O2 as tolerated - CXR - CTPA   Osteomyelitis of right hip Seen on most recent MRI.  Being treated with daptomycin as above. - IV daptomycin  Blood loss anemia Stable, Hgb 9.9. - monitor CBC  Urinary retention Improving. - bladder scan if patient feels unable to void  T2DM Home medications include Januvia 100 mg daily, metformin 1000 mg twice daily, sliding scale insulin, 18 units insulin glargine in the mornings, Jardiance 25 mg daily.Also on gabapentin for neuropathy.Most recent A1c 9.1. Fasting CBG 98. - Lantus 18 units daily - metformin 1000 mg BID - sSSI - monitor CBG - continue gabapentin - hold empagliflozin and Januvia  HTN On amlodipine 2.5 mg at home.  Normotensive. - hold amlodipine  Constipation Resolved with bowel regimen. - Miralax  BID - docusate BID - senna BID  Chronic pancreatitis On pancrelipase 3 times daily with meals. - continue Creon TID  Asthma and history of seasonal allergies Home medications include albuterol as needed, Advair daily, Singulair, Zyrtec. -Continue home medications -Dulera per formulary   History of alcohol abuse/tobacco use disorder Patient states his last drink was about 1.5 years ago. Continues to smoke about half pack per day, previously smoked about a pack per day for 35 years. -nicotine patch as desired  Anxiety Depression Home medications include duloxetine 60 mg daily, Abilify 5 mg daily. -Continue home medications  GERD Home medications include pantoprazole 40 mg twice daily -Continue home medications  History of tremors Home medications include propranolol - holding home propranolol  Left foot recent toe amputation Occurred on 3/23. 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 - Continue to monitor  Unintentional weight loss:  Weight loss of about 30-35 pounds per patient on admission.Weight stable thus  far. -daily weights -encourage diet -nutrition consulted  FEN/GI: carb modified diet PPx: LMWH  Disposition: progressive, plan for home with services when medically stable  Subjective:  NAOE.  Patient reports continued cough at night which is nonproductive, but he feels congested and unable to bring up phlegm.  Also reporting dyspnea.  Reports pain is manageable with current medications.  Objective: Temp:  [97.5 F (36.4 C)-100.1 F (37.8 C)] 98 F (36.7 C) (04/27 0400) Pulse Rate:  [92-114] 106 (04/27 0500) Resp:  [9-24] 17 (04/27 0500) BP: (106-141)/(72-103) 141/96 (04/27 0500) SpO2:  [91 %-99 %] 96 % (04/27 0500) Weight:  [111.5 kg] 111.5 kg (04/27 0500) Physical Exam: General: Obese middle-aged male resting comfortably in bed Cardiovascular: RRR, no murmurs Respiratory: Diffuse rhonchi, breathing comfortably on nasal cannula Abdomen: Soft, nontender, positive bowel sounds Extremities: WWP, no edema, s/p right TMA and left toe amputation  Laboratory: Recent Labs  Lab 01/06/21 0540 01/07/21 0541 01/08/21 0516  WBC 14.4* 12.0* 12.0*  HGB 11.3* 10.3* 9.9*  HCT 35.3* 32.0* 31.0*  PLT 386 387 415*   Recent Labs  Lab 01/06/21 0540 01/07/21 0541 01/08/21 0516  NA 131* 133* 134*  K 4.3 4.4 3.9  CL 98 99 99  CO2 24 27 29   BUN 19 16 11   CREATININE 0.75 0.85 0.74  CALCIUM 8.3* 8.5* 8.4*  GLUCOSE 277* 172* 94      Imaging/Diagnostic Tests: DG Chest 2 View  Result Date: 01/08/2021 CLINICAL DATA:  Shortness of breath. EXAM: CHEST - 2 VIEW COMPARISON:  January 06, 2021. FINDINGS: The heart size and mediastinal contours are within normal limits. No definite pneumothorax is noted. Left lung is clear. Right-sided PICC line is noted and unchanged in position. Large right lower lobe airspace opacity is noted concerning for pneumonia or atelectasis with associated pleural effusion. The visualized skeletal structures are unremarkable. IMPRESSION: Large right lower lobe  airspace opacity is noted concerning for pneumonia or atelectasis with associated pleural effusion. Electronically Signed   By: Marijo Conception M.D.   On: 01/08/2021 11:10   CT ANGIO CHEST PE W OR WO CONTRAST  Result Date: 01/08/2021 CLINICAL DATA:  Shortness of breath EXAM: CT ANGIOGRAPHY CHEST WITH CONTRAST TECHNIQUE: Multidetector CT imaging of the chest was performed using the standard protocol during bolus administration of intravenous contrast. Multiplanar CT image reconstructions and MIPs were obtained to evaluate the vascular anatomy. CONTRAST:  13mL OMNIPAQUE IOHEXOL 350 MG/ML SOLN COMPARISON:  01/05/2021 FINDINGS: Cardiovascular: Satisfactory opacification of the pulmonary arteries to the segmental level. No evidence of pulmonary embolism. Normal heart size. No pericardial effusion. Mediastinum/Nodes: No enlarged mediastinal, hilar, or axillary lymph nodes. Thyroid gland, trachea, and esophagus demonstrate no significant findings. Lungs/Pleura: Small left pleural effusion. Moderate-sized loculated right pleural effusion. Interval removal of the right-sided chest tube. Small residual air in the right pleural fluid adjacent to the resected eighth rib at the site of the chest tube insertion. Right lower lobe compressive atelectasis. No pneumothorax. Upper Abdomen: Musculoskeletal: Right posterolateral eighth rib resection. Prior T9 corpectomy transfixed with a cage extending from T8 through T10. Review of the MIP images confirms the above findings. IMPRESSION: 1. No evidence of pulmonary embolus. 2. New moderate-sized loculated right pleural effusion. Interval removal of the right-sided chest tube. Small residual air in the right pleural fluid adjacent to the resected eighth rib at the site of the chest tube insertion. Right lower lobe compressive atelectasis. 3.  Small left pleural effusion. 4. Right posterolateral eighth rib resection. Prior T9 corpectomy transfixed with a cage extending from T8 through  T10. Electronically Signed   By: Kathreen Devoid   On: 01/08/2021 13:08      Zola Button, MD 01/08/2021, 7:39 AM PGY-1, [place] Intern pager: [phone], text pages welcome

## 2021-01-08 NOTE — Progress Notes (Addendum)
      [address]       [place] [address]             [phone]      5 Days Post-Op Procedure(s) (LRB): Decompression of  Thoracic nine and Thoracic ten via corpectomy reconstruction with titanium spacer and rib autogrtaft. Lateral plate fixation Thoracic eight - Thoracic eleven (Right) THORACIC EXPOSURE with removal of rib (Right) Subjective:  Awake and alert, says he feels he is getting more "winded" with activity than he has on previous days.    Objective: Vital signs in last 24 hours: Temp:  [97.5 F (36.4 C)-100.1 F (37.8 C)] 98 F (36.7 C) (04/27 0400) Pulse Rate:  [94-114] 106 (04/27 0500) Resp:  [11-24] 17 (04/27 0500) BP: (106-141)/(72-103) 141/96 (04/27 0500) SpO2:  [91 %-99 %] 96 % (04/27 0500) Weight:  [111.5 kg] 111.5 kg (04/27 0500)    Intake/Output from previous day: 04/26 0701 - 04/27 0700 In: 140.5 [IV Piggyback:140.5] Out: 900 [Urine:900] Intake/Output this shift: No intake/output data recorded.  General appearance: alert, cooperative and no distress Heart: RRR, NSR on monitor.  Lungs: Breath sounds remain clear, no crackles or wheezes O2 sats reasonable on 2LncO2. Wound: the right chest incision is well approximated and dry.    Lab Results: Recent Labs    01/07/21 0541 01/08/21 0516  WBC 12.0* 12.0*  HGB 10.3* 9.9*  HCT 32.0* 31.0*  PLT 387 415*   BMET:  Recent Labs    01/07/21 0541 01/08/21 0516  NA 133* 134*  K 4.4 3.9  CL 99 99  CO2 27 29  GLUCOSE 172* 94  BUN 16 11  CREATININE 0.85 0.74  CALCIUM 8.5* 8.4*    PT/INR: No results for input(s): LABPROT, INR in the last 72 hours. ABG    Component Value Date/Time   PHART 7.290 (L) 01/03/2021 2115   HCO3 29.8 (H) 01/03/2021 2115   TCO2 32 01/03/2021 2115   O2SAT 100.0 01/03/2021 2115   CBG (last 3)  Recent Labs    01/07/21 1659 01/07/21 2123 01/08/21 0704  GLUCAP 73 77 98    Assessment/Plan: S/P Procedure(s) (LRB): Decompression of  Thoracic nine  and Thoracic ten via corpectomy reconstruction with titanium spacer and rib autogrtaft. Lateral plate fixation Thoracic eight - Thoracic eleven (Right) THORACIC EXPOSURE with removal of rib (Right)  -POD-5 right thoracotomy exposure for vertebrectomy and lateral vertebral fixation for spinal osteomyelitis. Says he is more short of breath with activity. Will re-check the CXR today.  Continue working on incentive spirometry, O2 wean and mobility.     LOS: 11 days    [name] Kindle [phone] 01/08/2021   Chart reviewed, patient examined, agree with above. He reports more shortness of breath over the past two days and cough that he did not have. CXR today showed new right pleural effusion and CTA chest shows a multiloculated moderate right pleural effusion that was not present on prior CT 4/24 with compressive atelectasis RLL. Hgb went from 35>32>31 over the past three days. Will repeat the CXR in am but I think this will require drainage in the OR on Friday to prevent infection, worsening of respiratory status. Earlier drainage will be easier than later. I discussed this with him and he is in agreement.

## 2021-01-09 ENCOUNTER — Inpatient Hospital Stay (HOSPITAL_COMMUNITY): Payer: 59

## 2021-01-09 ENCOUNTER — Other Ambulatory Visit: Payer: Self-pay | Admitting: *Deleted

## 2021-01-09 DIAGNOSIS — Z9889 Other specified postprocedural states: Secondary | ICD-10-CM | POA: Diagnosis not present

## 2021-01-09 DIAGNOSIS — J90 Pleural effusion, not elsewhere classified: Secondary | ICD-10-CM

## 2021-01-09 DIAGNOSIS — E1140 Type 2 diabetes mellitus with diabetic neuropathy, unspecified: Secondary | ICD-10-CM | POA: Diagnosis not present

## 2021-01-09 DIAGNOSIS — D649 Anemia, unspecified: Secondary | ICD-10-CM | POA: Diagnosis not present

## 2021-01-09 DIAGNOSIS — G061 Intraspinal abscess and granuloma: Secondary | ICD-10-CM | POA: Diagnosis not present

## 2021-01-09 DIAGNOSIS — R710 Precipitous drop in hematocrit: Secondary | ICD-10-CM | POA: Diagnosis not present

## 2021-01-09 DIAGNOSIS — K5900 Constipation, unspecified: Secondary | ICD-10-CM | POA: Diagnosis not present

## 2021-01-09 DIAGNOSIS — R0602 Shortness of breath: Secondary | ICD-10-CM | POA: Diagnosis not present

## 2021-01-09 LAB — GLUCOSE, CAPILLARY
Glucose-Capillary: 132 mg/dL — ABNORMAL HIGH (ref 70–99)
Glucose-Capillary: 200 mg/dL — ABNORMAL HIGH (ref 70–99)
Glucose-Capillary: 154 mg/dL — ABNORMAL HIGH (ref 70–99)
Glucose-Capillary: 126 mg/dL — ABNORMAL HIGH (ref 70–99)

## 2021-01-09 LAB — CBC
HCT: 30.2 % — ABNORMAL LOW (ref 39.0–52.0)
Hemoglobin: 9.7 g/dL — ABNORMAL LOW (ref 13.0–17.0)
MCH: 28.4 pg (ref 26.0–34.0)
MCHC: 32.1 g/dL (ref 30.0–36.0)
MCV: 88.3 fL (ref 80.0–100.0)
Platelets: 441 10*3/uL — ABNORMAL HIGH (ref 150–400)
RBC: 3.42 MIL/uL — ABNORMAL LOW (ref 4.22–5.81)
RDW: 15.1 % (ref 11.5–15.5)
WBC: 9.5 10*3/uL (ref 4.0–10.5)
nRBC: 0 % (ref 0.0–0.2)

## 2021-01-09 LAB — TYPE AND SCREEN
ABO/RH(D): O POS
Antibody Screen: NEGATIVE

## 2021-01-09 MED ORDER — SENNA 8.6 MG PO TABS: 1.0000 | ORAL_TABLET | Freq: Every day | ORAL | Status: DC | PRN

## 2021-01-09 MED ORDER — SODIUM CHLORIDE 0.9% IV SOLUTION: Freq: Once | INTRAVENOUS | Status: DC

## 2021-01-09 MED FILL — Heparin Sodium (Porcine) Inj 1000 Unit/ML: INTRAMUSCULAR | Qty: 30 | Status: AC

## 2021-01-09 MED FILL — Sodium Chloride IV Soln 0.9%: INTRAVENOUS | Qty: 1000 | Status: AC

## 2021-01-09 NOTE — Progress Notes (Signed)
Family Medicine Teaching Service Daily Progress Note Intern Pager: [phone]  Patient name: Christopher Fernandez Medical record number: [id number] Date of birth: 1970-12-08 Age: 50 y.o. Gender: male  Primary Care Provider: Danna Hefty, DO Consultants: Neurosurgery, cardiothoracic surgery, ID   Code Status: Full Code  Pt Overview and Major Events to Date:  4/15: Admitted  4/22: T8-11 lateral fusion, T9-10 corpectomy, 1u pRBC 4/23: 2u pRBC 4/24: 1u pRBC  Assessment and Plan: Christopher Fernandez is a 50 y.o. male with worsening osteomyelitis and concern of new abscess/phlegmon in thoracic spine now s/p decompression of the thoracic spine with corpectomy reconstruction. PMH is significant forhistory of thoracic osteomyelitis, T2DM, peripheral neuropathy, chronic pancreatitis, HLD, HTN, asthma, seasonal allergies, GERD.  Osteomyelitis/discitiswithnew abscess/phlegmoninthoracic spine POD 4 s/p T8-11 lateral fusion and T9-10 corpectomy Pain well controlled on current regimen. - neurosurgery and CTS following, appreciate recommendations - ID following, appreciate recommendations - IV daptomycin (4/15-) - IV rifampin (4/25-) - APAP 1000 mg q6h - meloxicam 15 mg daily - oxycodone 10 mg q4h - IV hydromorphone 0.5 mg q2h prn breakthrough - methocarbamol 500 mg q6h prn - duloxetine 60 mg daily - PT/OT: recommended rolling walker with 5" wheels and 3 in 1 bedside commode  Right loculated pleural effusion Seen on CTPA. Repeat CXR this AM stable without significant worsening. Remains stable on 2L [place]. - plan for surgical drainage tomorrow - wean O2 as tolerated  Osteomyelitis of right hip Seen on most recent MRI.  Being treated with daptomycin as above. - IV daptomycin  Blood loss anemia Resolved. Stable, Hgb 9.9. - monitor CBC  Urinary retention Resolved.  T2DM Home medications include Januvia 100 mg daily, metformin 1000 mg twice daily, sliding scale insulin, 18 units insulin  glargine in the mornings, Jardiance 25 mg daily.Also on gabapentin for neuropathy.Most recent A1c 9.1. Adequately controlled, fasting CBG 132. - Lantus 18 units daily - metformin 1000 mg BID - sSSI - monitor CBG - continue gabapentin - hold empagliflozin and Januvia  HTN On amlodipine 2.5 mg at home.  Normotensive. - hold amlodipine  Constipation Resolved with bowel regimen. - Miralax daily prn - docusate BID - senna daily prn  Chronic pancreatitis On pancrelipase 3 times daily with meals. - continue Creon TID  Asthma and history of seasonal allergies Home medications include albuterol as needed, Advair daily, Singulair, Zyrtec. -Continue home medications -Dulera per formulary   History of alcohol abuse/tobacco use disorder Patient states his last drink was about 1.5 years ago. Continues to smoke about half pack per day, previously smoked about a pack per day for 35 years. -nicotine patch as desired  Anxiety Depression Home medications include duloxetine 60 mg daily, Abilify 5 mg daily.  May need to adjust aripiprazole dosing with rifampin starting. -Continue home medications  GERD Home medications include pantoprazole 40 mg twice daily -Continue home medications  History of tremors Home medications include propranolol - holding home propranolol  Left foot recent toe amputation Occurred on 3/23. 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 - Continue to monitor  Unintentional weight loss: Weight loss of about 30-35 pounds per patient on admission.Weight stable thus far. -daily weights -encourage diet -nutrition consulted  FEN/GI: carb modified diet PPx: LMWH  Disposition: progressive, plan for home with services when medically stable   Subjective:  NAOE.  Patient reports dyspnea is about the same from yesterday.  Reports some decreased appetite.  Hoping surgery tomorrow will help with his breathing.  Objective: Temp:   [97.7  F (36.5 C)-99 F (37.2 C)] 97.8 F (36.6 C) (04/28 0400) Pulse Rate:  [88-115] 97 (04/28 0600) Resp:  [12-19] 15 (04/28 0600) BP: (103-134)/(62-89) 134/84 (04/28 0600) SpO2:  [95 %-99 %] 95 % (04/28 0600) Weight:  [111.5 kg] 111.5 kg (04/28 0500) Physical Exam: General: Obese middle-aged male resting comfortably in bed Cardiovascular: RRR, no murmurs Respiratory: Decreased breath sounds at the right lower base, otherwise clear to auscultation bilaterally, breathing comfortably on nasal cannula Abdomen: Soft, nontender, positive bowel sounds Extremities: WWP, no edema, s/p right TMA and left toe amputation  Laboratory: Recent Labs  Lab 01/07/21 0541 01/08/21 0516 01/09/21 0450  WBC 12.0* 12.0* 9.5  HGB 10.3* 9.9* 9.7*  HCT 32.0* 31.0* 30.2*  PLT 387 415* 441*   Recent Labs  Lab 01/06/21 0540 01/07/21 0541 01/08/21 0516  NA 131* 133* 134*  K 4.3 4.4 3.9  CL 98 99 99  CO2 24 27 29   BUN 19 16 11   CREATININE 0.75 0.85 0.74  CALCIUM 8.3* 8.5* 8.4*  GLUCOSE 277* 172* 94      Imaging/Diagnostic Tests: DG Chest 2 View  Result Date: 01/09/2021 CLINICAL DATA:  Pleural effusion. EXAM: CHEST - 2 VIEW COMPARISON:  January 08, 2021. FINDINGS: Similar versus slightly increased moderate loculated right pleural effusion. Small left pleural effusion better characterized on chest CT. Similar overlying right basilar opacities. Left lung is clear. No visible pneumothorax. Stable cardiomediastinal silhouette. Right upper extremity PICC with the tip projecting at the lower SVC. Lower thoracic fixation with corpectomy and cage. IMPRESSION: Similar versus slightly increased moderate loculated right pleural effusion. Similar overlying right basilar opacities, likely compressive atelectasis. Electronically Signed   By: Margaretha Sheffield MD   On: 01/09/2021 06:24   DG Chest 2 View  Result Date: 01/08/2021 CLINICAL DATA:  Shortness of breath. EXAM: CHEST - 2 VIEW COMPARISON:  January 06, 2021. FINDINGS: The heart size and mediastinal contours are within normal limits. No definite pneumothorax is noted. Left lung is clear. Right-sided PICC line is noted and unchanged in position. Large right lower lobe airspace opacity is noted concerning for pneumonia or atelectasis with associated pleural effusion. The visualized skeletal structures are unremarkable. IMPRESSION: Large right lower lobe airspace opacity is noted concerning for pneumonia or atelectasis with associated pleural effusion. Electronically Signed   By: Marijo Conception M.D.   On: 01/08/2021 11:10   CT ANGIO CHEST PE W OR WO CONTRAST  Result Date: 01/08/2021 CLINICAL DATA:  Shortness of breath EXAM: CT ANGIOGRAPHY CHEST WITH CONTRAST TECHNIQUE: Multidetector CT imaging of the chest was performed using the standard protocol during bolus administration of intravenous contrast. Multiplanar CT image reconstructions and MIPs were obtained to evaluate the vascular anatomy. CONTRAST:  70mL OMNIPAQUE IOHEXOL 350 MG/ML SOLN COMPARISON:  01/05/2021 FINDINGS: Cardiovascular: Satisfactory opacification of the pulmonary arteries to the segmental level. No evidence of pulmonary embolism. Normal heart size. No pericardial effusion. Mediastinum/Nodes: No enlarged mediastinal, hilar, or axillary lymph nodes. Thyroid gland, trachea, and esophagus demonstrate no significant findings. Lungs/Pleura: Small left pleural effusion. Moderate-sized loculated right pleural effusion. Interval removal of the right-sided chest tube. Small residual air in the right pleural fluid adjacent to the resected eighth rib at the site of the chest tube insertion. Right lower lobe compressive atelectasis. No pneumothorax. Upper Abdomen: Musculoskeletal: Right posterolateral eighth rib resection. Prior T9 corpectomy transfixed with a cage extending from T8 through T10. Review of the MIP images confirms the above findings. IMPRESSION: 1. No evidence of pulmonary embolus.  2. New  moderate-sized loculated right pleural effusion. Interval removal of the right-sided chest tube. Small residual air in the right pleural fluid adjacent to the resected eighth rib at the site of the chest tube insertion. Right lower lobe compressive atelectasis. 3. Small left pleural effusion. 4. Right posterolateral eighth rib resection. Prior T9 corpectomy transfixed with a cage extending from T8 through T10. Electronically Signed   By: Kathreen Devoid   On: 01/08/2021 13:08      Zola Button, MD 01/09/2021, 7:26 AM PGY-1, [place] Intern pager: [phone], text pages welcome

## 2021-01-09 NOTE — Progress Notes (Signed)
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 (HCC)   Spinal epidural abscess   S/P thoracotomy   Post-operative hemoglobin drop   Constipation   Previous back surgery   Shortness of breath  Antibiotics:Daptomycin 4/15-current                    Rifampin 4/25- current Total days of antibiotics 13  Lines/Tubes:PICC right arm 3/27   Interval Events: Remains afebrile with no leukocytosis, CT chest with new moderate sized loculated right pleural effusion and small left pleural effusion   Assessment Loculated right-sided pleural effusion Disseminated  MRSA infection T9-T10 discitis/osteomyelitis and surrounding phlegmon Right 10th rib 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  We will add doxycycline for pulmonary coverage for the time being CT surgery following-patient might go to OR. We will follow CT surgery recommendations  Please send sample for cultures if goes to the OR Monitor CBC CMP and CPK  Dr Gale Journey will follow tomorrow and over the weekend.  Rest of the management as per the primary team. Thank you for the consult. Please page with pertinent questions or  concerns.  _____________________________________________________________________ Subjective patient seen and examined at the bedside.  Sitting in chair, mild shortness of breath and cough.  Denies nausea vomiting and diarrhea   Vitals BP 128/83   Pulse 87   Temp 97.8 F (36.6 C) (Oral)   Resp 12   Ht 6\' 3"  (1.905 m)   Wt 111.5 kg   SpO2 97%   BMI 30.72 kg/m     Physical Exam Sitting up in chair, on nasal cannula, not in acute distress Mucosa moist Chest-decreased air in the right lower lung fields, occasional Rales CVS normal heart sounds Abdomen soft Extremities minimal pedal edema Skin no rashes   Pertinent Microbiology Results for orders placed or performed during the hospital encounter of 12/27/20  SARS CORONAVIRUS 2 (TAT 6-24 HRS) Nasopharyngeal Nasopharyngeal Swab     Status: None   Collection Time: 12/27/20  6:16 AM   Specimen: Nasopharyngeal Swab  Result Value Ref Range Status   SARS Coronavirus 2 NEGATIVE NEGATIVE Final    Comment: (NOTE) SARS-CoV-2 target nucleic acids are NOT DETECTED.  The SARS-CoV-2 RNA is generally detectable in upper and lower respiratory specimens during the acute phase of infection. Negative results do not preclude SARS-CoV-2 infection, do not rule out co-infections with other pathogens, and should not be used as the sole basis for treatment or other patient management decisions. Negative results must be combined with clinical observations, patient history, and epidemiological information. The expected result is Negative.  Fact Sheet for Patients: SugarRoll.be  Fact Sheet for Healthcare Providers: https://www.woods-mathews.com/  This test is not yet approved or cleared by the [place] FDA and  has been authorized for detection and/or diagnosis of SARS-CoV-2 by FDA under an Emergency Use Authorization (EUA). This EUA will remain  in effect (meaning this test can be used) for the  duration of the COVID-19 declaration under Se ction 564(b)(1) of the Act, 21 U.S.C. section 360bbb-3(b)(1), unless the authorization is terminated or revoked sooner.  Performed at [facility], [place] [address], [place], [place] [address]   Blood culture (routine single)     Status: None   Collection Time: 12/27/20  6:29 AM   Specimen: BLOOD LEFT HAND  Result Value Ref Range Status   Specimen Description BLOOD LEFT HAND  Final   Special Requests   Final    BOTTLES DRAWN AEROBIC AND ANAEROBIC Blood Culture results may not be optimal due to an inadequate volume of blood received in culture bottles   Culture   Final    NO GROWTH 5 DAYS Performed at [facility], [place] [address] [place], [place] [address]    Report Status 01/01/2021 FINAL  Final  Urine culture     Status: Abnormal   Collection Time: 12/27/20  9:26 AM   Specimen: In/Out Cath Urine  Result Value Ref Range Status   Specimen Description IN/OUT CATH URINE  Final   Special Requests NONE  Final   Culture (A)  Final    1,000 COLONIES/mL DIPHTHEROIDS(CORYNEBACTERIUM SPECIES) Standardized susceptibility testing for this organism is not available. Performed at [facility], [place] [address], [place], [place] [address]    Report Status 12/28/2020 FINAL  Final  Surgical pcr screen     Status: None   Collection Time: 12/30/20 10:49 PM   Specimen: Nasal Mucosa; Nasal Swab  Result Value Ref Range Status   MRSA, PCR NEGATIVE NEGATIVE Final   Staphylococcus aureus NEGATIVE NEGATIVE Final    Comment: (NOTE) The Xpert SA Assay (FDA approved for NASAL specimens in patients 54 years of age and older), is one component of a comprehensive surveillance program. It is not intended to diagnose infection nor to guide or monitor treatment. Performed at [facility], [place] [address] [place], [place] [address]   Aerobic/Anaerobic Culture w Gram Stain (surgical/deep wound)     Status: None (Preliminary result)    Collection Time: 01/03/21  6:37 PM   Specimen: Wound  Result Value Ref Range Status   Specimen Description WOUND  Final   Special Requests 10 THORACIC 9  Final   Gram Stain   Final    FEW WBC PRESENT,BOTH PMN AND MONONUCLEAR NO ORGANISMS SEEN Performed at [facility], [address], [place], [place] [address]    Culture   Final    RARE METHICILLIN RESISTANT STAPHYLOCOCCUS AUREUS Sent to [facility] for further susceptibility testing. NO ANAEROBES ISOLATED; CULTURE IN PROGRESS FOR 5 DAYS    Report Status PENDING  Incomplete   Organism ID, Bacteria METHICILLIN RESISTANT STAPHYLOCOCCUS AUREUS  Final      Susceptibility   Methicillin resistant staphylococcus aureus - MIC*    CIPROFLOXACIN <=0.5 SENSITIVE Sensitive     ERYTHROMYCIN >=8 RESISTANT Resistant     GENTAMICIN <=0.5 SENSITIVE Sensitive     OXACILLIN >=4 RESISTANT Resistant     TETRACYCLINE <=1 SENSITIVE Sensitive     VANCOMYCIN 1 SENSITIVE Sensitive     TRIMETH/SULFA <=10 SENSITIVE Sensitive     CLINDAMYCIN <=0.25 SENSITIVE Sensitive     RIFAMPIN <=0.5 SENSITIVE Sensitive     Inducible Clindamycin NEGATIVE Sensitive     * RARE METHICILLIN RESISTANT STAPHYLOCOCCUS AUREUS    Pertinent Lab. CBC Latest Ref Rng & Units 01/09/2021 01/08/2021 01/07/2021  WBC  4.0 - 10.5 K/uL 9.5 12.0(H) 12.0(H)  Hemoglobin 13.0 - 17.0 g/dL 9.7(L) 9.9(L) 10.3(L)  Hematocrit 39.0 - 52.0 % 30.2(L) 31.0(L) 32.0(L)  Platelets 150 - 400 K/uL 441(H) 415(H) 387   CMP Latest Ref Rng & Units 01/08/2021 01/07/2021 01/06/2021  Glucose 70 - 99 mg/dL 94 172(H) 277(H)  BUN 6 - 20 mg/dL 11 16 19   Creatinine 0.61 - 1.24 mg/dL 0.74 0.85 0.75  Sodium 135 - 145 mmol/L 134(L) 133(L) 131(L)  Potassium 3.5 - 5.1 mmol/L 3.9 4.4 4.3  Chloride 98 - 111 mmol/L 99 99 98  CO2 22 - 32 mmol/L 29 27 24   Calcium 8.9 - 10.3 mg/dL 8.4(L) 8.5(L) 8.3(L)  Total Protein 6.5 - 8.1 g/dL - - -  Total Bilirubin 0.3 - 1.2 mg/dL - - -  Alkaline Phos 38 - 126 U/L - - -  AST 15 - 41  U/L - - -  ALT 0 - 44 U/L - - -     Pertinent Imaging today Plain films and CT images have been personally visualized and interpreted; radiology reports have been reviewed. Decision making incorporated into the Impression / Recommendations.  Chest Xray 01/09/21 FINDINGS: Similar versus slightly increased moderate loculated right pleural effusion. Small left pleural effusion better characterized on chest CT. Similar overlying right basilar opacities. Left lung is clear. No visible pneumothorax. Stable cardiomediastinal silhouette. Right upper extremity PICC with the tip projecting at the lower SVC. Lower thoracic fixation with corpectomy and cage.  IMPRESSION: Similar versus slightly increased moderate loculated right pleural effusion. Similar overlying right basilar opacities, likely compressive atelectasis.    CT Angio Chest PE 01/08/21 FINDINGS: Cardiovascular: Satisfactory opacification of the pulmonary arteries to the segmental level. No evidence of pulmonary embolism. Normal heart size. No pericardial effusion.  Mediastinum/Nodes: No enlarged mediastinal, hilar, or axillary lymph nodes. Thyroid gland, trachea, and esophagus demonstrate no significant findings.  Lungs/Pleura: Small left pleural effusion. Moderate-sized loculated right pleural effusion. Interval removal of the right-sided chest tube. Small residual air in the right pleural fluid adjacent to the resected eighth rib at the site of the chest tube insertion. Right lower lobe compressive atelectasis. No pneumothorax.  Upper Abdomen:  Musculoskeletal: Right posterolateral eighth rib resection. Prior T9 corpectomy transfixed with a cage extending from T8 through T10.  Review of the MIP images confirms the above findings.  IMPRESSION: 1. No evidence of pulmonary embolus. 2. New moderate-sized loculated right pleural effusion. Interval removal of the right-sided chest tube. Small residual air in  the right pleural fluid adjacent to the resected eighth rib at the site of the chest tube insertion. Right lower lobe compressive atelectasis. 3. Small left pleural effusion. 4. Right posterolateral eighth rib resection. Prior T9 corpectomy transfixed with a cage extending from T8 through T10.    I have spent approx 30 minutes for this patient encounter including review of prior medical records, coordination of care  with greater than 50% of time being face to face/counseling and discussing diagnostics/treatment plan with the patient/family.  Electronically signed by:   Rosiland Oz, MD Infectious Disease Physician [facility] Pager: [phone]

## 2021-01-09 NOTE — Progress Notes (Addendum)
[address]       [place] [address]             [phone]      6 Days Post-Op Procedure(s) (LRB): Decompression of  Thoracic nine and Thoracic ten via corpectomy reconstruction with titanium spacer and rib autogrtaft. Lateral plate fixation Thoracic eight - Thoracic eleven (Right) THORACIC EXPOSURE with removal of rib (Right) Subjective:  Awake and alert, says he feels about the same today, feels he is restricted when  taking a deep breath. Pain is controlled.   Objective: Vital signs in last 24 hours: Temp:  [97.7 F (36.5 C)-99 F (37.2 C)] 97.8 F (36.6 C) (04/28 0400) Pulse Rate:  [87-115] 87 (04/28 0700) Cardiac Rhythm: Normal sinus rhythm (04/27 2000) Resp:  [12-19] 12 (04/28 0700) BP: (103-134)/(62-89) 128/83 (04/28 0700) SpO2:  [95 %-99 %] 97 % (04/28 0748) Weight:  [111.5 kg] 111.5 kg (04/28 0500)    Intake/Output from previous day: 04/27 0701 - 04/28 0700 In: 80 [I.V.:10; IV Piggyback:70] Out: 1000 [Urine:1000] Intake/Output this shift: No intake/output data recorded.  General appearance: alert, cooperative and no distress Heart :Sinustach on monitor.  Lungs: Breath sounds remain clear, no crackles or wheezes O2 sats stablein high 90's on 2LncO2. Wound: the right chest incision is well approximated and dry.    Lab Results: Recent Labs    01/08/21 0516 01/09/21 0450  WBC 12.0* 9.5  HGB 9.9* 9.7*  HCT 31.0* 30.2*  PLT 415* 441*   BMET:  Recent Labs    01/07/21 0541 01/08/21 0516  NA 133* 134*  K 4.4 3.9  CL 99 99  CO2 27 29  GLUCOSE 172* 94  BUN 16 11  CREATININE 0.85 0.74  CALCIUM 8.5* 8.4*    PT/INR: No results for input(s): LABPROT, INR in the last 72 hours. ABG    Component Value Date/Time   PHART 7.290 (L) 01/03/2021 2115   HCO3 29.8 (H) 01/03/2021 2115   TCO2 32 01/03/2021 2115   O2SAT 100.0 01/03/2021 2115   CBG (last 3)  Recent Labs    01/08/21 1633 01/08/21 2105 01/09/21 0816  GLUCAP 168* 156*  132*   EXAM: CT ANGIOGRAPHY CHEST WITH CONTRAST  TECHNIQUE: Multidetector CT imaging of the chest was performed using the standard protocol during bolus administration of intravenous contrast. Multiplanar CT image reconstructions and MIPs were obtained to evaluate the vascular anatomy.  CONTRAST:  84mL OMNIPAQUE IOHEXOL 350 MG/ML SOLN  COMPARISON:  01/05/2021  FINDINGS: Cardiovascular: Satisfactory opacification of the pulmonary arteries to the segmental level. No evidence of pulmonary embolism. Normal heart size. No pericardial effusion.  Mediastinum/Nodes: No enlarged mediastinal, hilar, or axillary lymph nodes. Thyroid gland, trachea, and esophagus demonstrate no significant findings.  Lungs/Pleura: Small left pleural effusion. Moderate-sized loculated right pleural effusion. Interval removal of the right-sided chest tube. Small residual air in the right pleural fluid adjacent to the resected eighth rib at the site of the chest tube insertion. Right lower lobe compressive atelectasis. No pneumothorax.  Upper Abdomen:  Musculoskeletal: Right posterolateral eighth rib resection. Prior T9 corpectomy transfixed with a cage extending from T8 through T10.  Review of the MIP images confirms the above findings.  IMPRESSION: 1. No evidence of pulmonary embolus. 2. New moderate-sized loculated right pleural effusion. Interval removal of the right-sided chest tube. Small residual air in the right pleural fluid adjacent to the resected eighth rib at the site of the chest tube insertion. Right lower lobe compressive  atelectasis. 3. Small left pleural effusion. 4. Right posterolateral eighth rib resection. Prior T9 corpectomy transfixed with a cage extending from T8 through T10.   Electronically Signed   By: Kathreen Devoid   On: 01/08/2021 13:08   EXAM: CHEST - 2 VIEW  COMPARISON:  January 08, 2021.  FINDINGS: Similar versus slightly increased moderate  loculated right pleural effusion. Small left pleural effusion better characterized on chest CT. Similar overlying right basilar opacities. Left lung is clear. No visible pneumothorax. Stable cardiomediastinal silhouette. Right upper extremity PICC with the tip projecting at the lower SVC. Lower thoracic fixation with corpectomy and cage.  IMPRESSION: Similar versus slightly increased moderate loculated right pleural effusion. Similar overlying right basilar opacities, likely compressive atelectasis.   Electronically Signed   By: Margaretha Sheffield MD   On: 01/09/2021 06:24  Assessment/Plan: S/P Procedure(s) (LRB): Decompression of  Thoracic nine and Thoracic ten via corpectomy reconstruction with titanium spacer and rib autogrtaft. Lateral plate fixation Thoracic eight - Thoracic eleven (Right) THORACIC EXPOSURE with removal of rib (Right)  -POD-6 right thoracotomy exposure for vertebrectomy and lateral vertebral fixation for spinal osteomyelitis. Says he is more short of breath with activity over past few days and he noted to have gradual decrease in Hct. CXR yesterday notable for new right-side volume loss with effusion and this was followed with a CTA showing moderate loculated right pleural effusion.  Plan for drainage of this in the OR tomorrow to prevent infection and worsening of respiratory function.    LOS: 12 days    [name] Kindle [phone] 01/09/2021   Chart reviewed, patient examined, agree with above. Plan right thoracotomy for drainage of loculated moderate right pleural effusion with RLL compressive atelectasis. I discussed the procedure with patient and his wife including alternatives, benefits and risks and they agree to proceed.

## 2021-01-09 NOTE — Patient Outreach (Signed)
[facility]) Care Management  01/09/2021  Christopher Fernandez 1970/10/21 [id number]   Multidisciplinary Case Discussion   Date of Review: 01/09/21 Reason:  Readmission less than 30 days  PCP: [place]    Patient was discussed in a multidisciplinary case discussion   Patient with less than 30 day readmission to [facility] 4/15 and remains admitted.   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  Patient lived at home with his wife that is a Marine scientist prior to admission.   Admissions Patient  was admitted 11/29/20- 3/29 for left toe wound, early gangrene osteomyelitis left 2nd toe amputation, Right hip pain, MSSA bacteremia Osteomyelitis T9-T10. Infectious disease consult, patient discharged home 3/20  with IV antibiotics x 6 weeks, HHRN. He had close follow up after discharge  with PCP, Infectious disease, with  pain managment adjustment , planned follow up CT to evaluate progress of Thoracic spine/Osteomyelitis prior to next  ID appointment. Patient to Infectious disease appointment on 12/26/20 Patient presented to [facility] ED on 4/15 and admitted Dx: Worsening discitis  MRSA  osteomyelitis and progression of a circumferential epidural abscess at T9-T10 with worsening spinal cord stenosis ,concern for new abscess,  Ostomyelitis pubic bone. Continued Daptomycin Iv antibiotic, with planned Right T9-T10  Thorcotomty , (CT surgeon and neurologist) on 4/22.  On 4/22 Decompression of Thoracic nine and Thoracic ten via corpectomy reconstruction with titanium spacer and rib autogrtaft. Lateral plate fixation Thoracic eight - Thoracic eleven   Plan Will continue to follow progress and communication with [facility] liaison for discharge disposition plans for  transition of care follow up.    Joylene Draft, RN, BSN  [place] Management Coordinator  [phone]- Mobile [phone]- Toll Free Main Office

## 2021-01-09 NOTE — Anesthesia Preprocedure Evaluation (Addendum)
Anesthesia Evaluation  Patient identified by MRN, date of birth, ID band Patient awake    Reviewed: Allergy & Precautions, NPO status , Patient's Chart, lab work & pertinent test results  Airway Mallampati: I  TM Distance: >3 FB Neck ROM: Full    Dental  (+) Dental Advisory Given   Pulmonary asthma , COPD,  COPD inhaler, Current Smoker and Patient abstained from smoking.,   R>L pleural effusion     + decreased breath sounds      Cardiovascular hypertension, Pt. on medications and Pt. on home beta blockers Normal cardiovascular exam     Neuro/Psych PSYCHIATRIC DISORDERS Anxiety Depression  Neuromuscular disease    GI/Hepatic hiatal hernia, GERD  Controlled and Medicated,(+)     substance abuse  alcohol use,   Endo/Other  diabetes, Type 2, Insulin Dependent, Oral Hypoglycemic Agents Obesity   Renal/GU negative Renal ROS     Musculoskeletal  (+) Arthritis ,   Abdominal   Peds  Hematology  (+) anemia ,   Anesthesia Other Findings Covid test negative   Reproductive/Obstetrics                           Lab Results  Component Value Date   WBC 9.5 01/09/2021   HGB 9.7 (L) 01/09/2021   HCT 30.2 (L) 01/09/2021   MCV 88.3 01/09/2021   PLT 441 (H) 01/09/2021   Lab Results  Component Value Date   CREATININE 0.74 01/08/2021   BUN 11 01/08/2021   NA 134 (L) 01/08/2021   K 3.9 01/08/2021   CL 99 01/08/2021   CO2 29 01/08/2021    Anesthesia Physical  Anesthesia Plan  ASA: IV  Anesthesia Plan: General   Post-op Pain Management:    Induction: Intravenous  PONV Risk Score and Plan: 2 and Dexamethasone, Ondansetron, Treatment may vary due to age or medical condition, Midazolam and Scopolamine patch - Pre-op  Airway Management Planned: Double Lumen EBT  Additional Equipment: Arterial line  Intra-op Plan:   Post-operative Plan: Possible Post-op intubation/ventilation  Informed  Consent: I have reviewed the patients History and Physical, chart, labs and discussed the procedure including the risks, benefits and alternatives for the proposed anesthesia with the patient or authorized representative who has indicated his/her understanding and acceptance.     Dental advisory given  Plan Discussed with: CRNA and Anesthesiologist  Anesthesia Plan Comments: (2 large bore peripheral IV)      Anesthesia Quick Evaluation

## 2021-01-09 NOTE — Progress Notes (Signed)
Patient ID: Christopher Fernandez, male   DOB: [birth year], 50 y.o.   MRN: [id number] Patient for return to the OR tomorrow to drain pleural effusion per Dr. Caffie Pinto.  I discussed the situation with him earlier and though I will be out of town one of my partners will be available should any neurosurgical intervention be required.

## 2021-01-09 NOTE — Progress Notes (Signed)
Physical Therapy Treatment Patient Details Name: Christopher Fernandez MRN: [id number] DOB: 1971/07/13 Today's Date: 01/09/2021    History of Present Illness AKSEL BENCOMO is a 50 y.o. male presenting with worsening osteomyelitis and concern of new abscess/phlegmon in thoracic spine after having a repeat MRI a few days ago. MRI evidence from 12/11/2010 worsening osteomyelitis of the parasymphyseal right superior pubic ramus with improvement in right abductor minimus myositis.s/p right-sided T7 thoracotomy with vertebrectomy of T9 and T10 for osteomyelitis decompression of epidural abscess reconstruction with Exe-Cort titanium implant and lateral plate fixation with traverse plate local autograft from T7 rib.PMH is significant for history of thoracic osteomyelitis, type 2 diabetes, peripheral neuropathy, chronic pancreatitis/pancreatic insufficiency, hyperlipidemia, hypertension, asthma, seasonal allergies, GERD.    PT Comments    The pt continues to make great progress with mobility and therapy goals at this time. He was able to progress ambulation distance while on 2L O2, and carry a conversation despite slight drop in SpO2. Pt reporting that he was "not as winded as he normally would be at home" after same level of activity performed today. Pt with good stability with use of RW with minG for safety, but continues with decreased self-selected gait speed. Pt challenged by intervals, encouraged to practice breathing techniques with mobility. Will continue to benefit from skilled PT to progress mobility, endurance, and dynamic stability to facilitate return to independence when medically cleared.    Follow Up Recommendations  Home health PT     Equipment Recommendations  Rolling walker with 5" wheels;3in1 (PT)    Recommendations for Other Services       Precautions / Restrictions Precautions Precautions: Back;Other (comment) Precaution Booklet Issued: No Precaution Comments: Watch O2 Required  Braces or Orthoses: Other Brace Other Brace: Has a darco shoe from previous admission (approx 3 wks ago); Darco shoe is not in room, and pt was noncomital re: if MD officially cleared him to walk without it; Has been consistently walking without it so far this admission Restrictions Weight Bearing Restrictions: No    Mobility  Bed Mobility Overal bed mobility: Modified Independent             General bed mobility comments: no assist needed tosay, HOB left elevated    Transfers Overall transfer level: Needs assistance Equipment used: Rolling walker (2 wheeled) Transfers: Sit to/from Stand Sit to Stand: Supervision         General transfer comment: supervision with intermittent verbal cues for hand positioning. able to steady without assist  Ambulation/Gait Ambulation/Gait assistance: Min guard;Supervision Gait Distance (Feet): 200 Feet Assistive device: Rolling walker (2 wheeled) Gait Pattern/deviations: Step-through pattern;Decreased stride length;Shuffle Gait velocity: 0.26 m/s Gait velocity interpretation: <1.31 ft/sec, indicative of household ambulator General Gait Details: good posture, good bilateral foot clereance, focus on imrproved speed with maintained SpO2. on 2L for most of ambulation, needed increase to 3L after 150 ft, but recovered with standing rest, pt able to carry conversation despite SpO2 reading of 75%       Balance Overall balance assessment: Needs assistance Sitting-balance support: No upper extremity supported;Feet supported Sitting balance-Leahy Scale: Good     Standing balance support: Bilateral upper extremity supported;During functional activity Standing balance-Leahy Scale: Poor Standing balance comment: reliant on external support                            Cognition Arousal/Alertness: Awake/alert Behavior During Therapy: WFL for tasks assessed/performed Overall Cognitive Status: Within Functional Limits  for tasks  assessed                                 General Comments: Pt with generally flat affect, but agreeable through session.      Exercises      General Comments General comments (skin integrity, edema, etc.): SpO2 89-94% on 2L at rest. pt able to walk short bout to bathroom on RA with SpO2 89-92%. 2L O2 used for ambulation, SpO2 remained 94-90% with good pleth initially, dropped into 70s with questionable pleth after 150 ft, pt recovered with standing rest, reports he is "not as winded as he would normally be at home"      Pertinent Vitals/Pain Pain Assessment: Faces Faces Pain Scale: Hurts little more Pain Location: surgical site Pain Descriptors / Indicators: Discomfort;Grimacing;Guarding Pain Intervention(s): Limited activity within patient's tolerance;Monitored during session;Repositioned           PT Goals (current goals can now be found in the care plan section) Acute Rehab PT Goals Patient Stated Goal: less pain PT Goal Formulation: With patient Time For Goal Achievement: 01/18/21 Potential to Achieve Goals: Good Progress towards PT goals: Progressing toward goals    Frequency    Min 5X/week      PT Plan Current plan remains appropriate       AM-PAC PT "6 Clicks" Mobility   Outcome Measure  Help needed turning from your back to your side while in a flat bed without using bedrails?: None Help needed moving from lying on your back to sitting on the side of a flat bed without using bedrails?: None Help needed moving to and from a bed to a chair (including a wheelchair)?: A Little Help needed standing up from a chair using your arms (e.g., wheelchair or bedside chair)?: A Little Help needed to walk in hospital room?: A Little Help needed climbing 3-5 steps with a railing? : A Little 6 Click Score: 20    End of Session Equipment Utilized During Treatment: Oxygen Activity Tolerance: Patient tolerated treatment well Patient left: with call  bell/phone within reach;in bed Nurse Communication: Mobility status PT Visit Diagnosis: Unsteadiness on feet (R26.81);Muscle weakness (generalized) (M62.81)     Time: 3546-5681 PT Time Calculation (min) (ACUTE ONLY): 42 min  Charges:  $Gait Training: 8-22 mins $Therapeutic Activity: 8-22 mins                     Karma Ganja, PT, DPT   Acute Rehabilitation Department Pager #: [phone]   Otho Bellows 01/09/2021, 9:58 AM

## 2021-01-09 NOTE — H&P (View-Only) (Signed)
[address]       [place] [address]             [phone]      6 Days Post-Op Procedure(s) (LRB): Decompression of  Thoracic nine and Thoracic ten via corpectomy reconstruction with titanium spacer and rib autogrtaft. Lateral plate fixation Thoracic eight - Thoracic eleven (Right) THORACIC EXPOSURE with removal of rib (Right) Subjective:  Awake and alert, says he feels about the same today, feels he is restricted when  taking a deep breath. Pain is controlled.   Objective: Vital signs in last 24 hours: Temp:  [97.7 F (36.5 C)-99 F (37.2 C)] 97.8 F (36.6 C) (04/28 0400) Pulse Rate:  [87-115] 87 (04/28 0700) Cardiac Rhythm: Normal sinus rhythm (04/27 2000) Resp:  [12-19] 12 (04/28 0700) BP: (103-134)/(62-89) 128/83 (04/28 0700) SpO2:  [95 %-99 %] 97 % (04/28 0748) Weight:  [111.5 kg] 111.5 kg (04/28 0500)    Intake/Output from previous day: 04/27 0701 - 04/28 0700 In: 80 [I.V.:10; IV Piggyback:70] Out: 1000 [Urine:1000] Intake/Output this shift: No intake/output data recorded.  General appearance: alert, cooperative and no distress Heart :Sinustach on monitor.  Lungs: Breath sounds remain clear, no crackles or wheezes O2 sats stablein high 90's on 2LncO2. Wound: the right chest incision is well approximated and dry.    Lab Results: Recent Labs    01/08/21 0516 01/09/21 0450  WBC 12.0* 9.5  HGB 9.9* 9.7*  HCT 31.0* 30.2*  PLT 415* 441*   BMET:  Recent Labs    01/07/21 0541 01/08/21 0516  NA 133* 134*  K 4.4 3.9  CL 99 99  CO2 27 29  GLUCOSE 172* 94  BUN 16 11  CREATININE 0.85 0.74  CALCIUM 8.5* 8.4*    PT/INR: No results for input(s): LABPROT, INR in the last 72 hours. ABG    Component Value Date/Time   PHART 7.290 (L) 01/03/2021 2115   HCO3 29.8 (H) 01/03/2021 2115   TCO2 32 01/03/2021 2115   O2SAT 100.0 01/03/2021 2115   CBG (last 3)  Recent Labs    01/08/21 1633 01/08/21 2105 01/09/21 0816  GLUCAP 168* 156*  132*   EXAM: CT ANGIOGRAPHY CHEST WITH CONTRAST  TECHNIQUE: Multidetector CT imaging of the chest was performed using the standard protocol during bolus administration of intravenous contrast. Multiplanar CT image reconstructions and MIPs were obtained to evaluate the vascular anatomy.  CONTRAST:  39mL OMNIPAQUE IOHEXOL 350 MG/ML SOLN  COMPARISON:  01/05/2021  FINDINGS: Cardiovascular: Satisfactory opacification of the pulmonary arteries to the segmental level. No evidence of pulmonary embolism. Normal heart size. No pericardial effusion.  Mediastinum/Nodes: No enlarged mediastinal, hilar, or axillary lymph nodes. Thyroid gland, trachea, and esophagus demonstrate no significant findings.  Lungs/Pleura: Small left pleural effusion. Moderate-sized loculated right pleural effusion. Interval removal of the right-sided chest tube. Small residual air in the right pleural fluid adjacent to the resected eighth rib at the site of the chest tube insertion. Right lower lobe compressive atelectasis. No pneumothorax.  Upper Abdomen:  Musculoskeletal: Right posterolateral eighth rib resection. Prior T9 corpectomy transfixed with a cage extending from T8 through T10.  Review of the MIP images confirms the above findings.  IMPRESSION: 1. No evidence of pulmonary embolus. 2. New moderate-sized loculated right pleural effusion. Interval removal of the right-sided chest tube. Small residual air in the right pleural fluid adjacent to the resected eighth rib at the site of the chest tube insertion. Right lower lobe compressive  atelectasis. 3. Small left pleural effusion. 4. Right posterolateral eighth rib resection. Prior T9 corpectomy transfixed with a cage extending from T8 through T10.   Electronically Signed   By: Kathreen Devoid   On: 01/08/2021 13:08   EXAM: CHEST - 2 VIEW  COMPARISON:  January 08, 2021.  FINDINGS: Similar versus slightly increased moderate  loculated right pleural effusion. Small left pleural effusion better characterized on chest CT. Similar overlying right basilar opacities. Left lung is clear. No visible pneumothorax. Stable cardiomediastinal silhouette. Right upper extremity PICC with the tip projecting at the lower SVC. Lower thoracic fixation with corpectomy and cage.  IMPRESSION: Similar versus slightly increased moderate loculated right pleural effusion. Similar overlying right basilar opacities, likely compressive atelectasis.   Electronically Signed   By: Margaretha Sheffield MD   On: 01/09/2021 06:24  Assessment/Plan: S/P Procedure(s) (LRB): Decompression of  Thoracic nine and Thoracic ten via corpectomy reconstruction with titanium spacer and rib autogrtaft. Lateral plate fixation Thoracic eight - Thoracic eleven (Right) THORACIC EXPOSURE with removal of rib (Right)  -POD-6 right thoracotomy exposure for vertebrectomy and lateral vertebral fixation for spinal osteomyelitis. Says he is more short of breath with activity over past few days and he noted to have gradual decrease in Hct. CXR yesterday notable for new right-side volume loss with effusion and this was followed with a CTA showing moderate loculated right pleural effusion.  Plan for drainage of this in the OR tomorrow to prevent infection and worsening of respiratory function.    LOS: 12 days    Antony Odea, [place] [phone] 01/09/2021

## 2021-01-10 ENCOUNTER — Inpatient Hospital Stay (HOSPITAL_COMMUNITY): Payer: 59 | Admitting: Certified Registered Nurse Anesthetist

## 2021-01-10 ENCOUNTER — Inpatient Hospital Stay (HOSPITAL_COMMUNITY): Payer: 59

## 2021-01-10 ENCOUNTER — Encounter (HOSPITAL_COMMUNITY)
Admission: EM | Disposition: A | Discharge status: Home / Self Care | Payer: Self-pay | Source: Home / Self Care | Attending: Family Medicine

## 2021-01-10 DIAGNOSIS — J90 Pleural effusion, not elsewhere classified: Secondary | ICD-10-CM

## 2021-01-10 HISTORY — PX: PLEURAL EFFUSION DRAINAGE: SHX5099

## 2021-01-10 HISTORY — PX: THORACOTOMY: SHX5074

## 2021-01-10 LAB — BASIC METABOLIC PANEL
Anion gap: 8 (ref 5–15)
Anion gap: 12 (ref 5–15)
BUN: 10 mg/dL (ref 6–20)
BUN: 8 mg/dL (ref 6–20)
CO2: 30 mmol/L (ref 22–32)
CO2: 26 mmol/L (ref 22–32)
Calcium: 8.1 mg/dL — ABNORMAL LOW (ref 8.9–10.3)
Calcium: 7.8 mg/dL — ABNORMAL LOW (ref 8.9–10.3)
Chloride: 96 mmol/L — ABNORMAL LOW (ref 98–111)
Chloride: 96 mmol/L — ABNORMAL LOW (ref 98–111)
Creatinine, Ser: 0.63 mg/dL (ref 0.61–1.24)
Creatinine, Ser: 0.75 mg/dL (ref 0.61–1.24)
GFR, Estimated: >60 mL/min (ref >60)
GFR, Estimated: >60 mL/min (ref >60)
Glucose, Bld: 131 mg/dL — ABNORMAL HIGH (ref 70–99)
Glucose, Bld: 184 mg/dL — ABNORMAL HIGH (ref 70–99)
Potassium: 3.7 mmol/L (ref 3.5–5.1)
Potassium: 4.2 mmol/L (ref 3.5–5.1)
Sodium: 134 mmol/L — ABNORMAL LOW (ref 135–145)
Sodium: 134 mmol/L — ABNORMAL LOW (ref 135–145)

## 2021-01-10 LAB — CBC
HCT: 29.6 % — ABNORMAL LOW (ref 39.0–52.0)
Hemoglobin: 9.5 g/dL — ABNORMAL LOW (ref 13.0–17.0)
MCH: 28.1 pg (ref 26.0–34.0)
MCHC: 32.1 g/dL (ref 30.0–36.0)
MCV: 87.6 fL (ref 80.0–100.0)
Platelets: 474 10*3/uL — ABNORMAL HIGH (ref 150–400)
RBC: 3.38 MIL/uL — ABNORMAL LOW (ref 4.22–5.81)
RDW: 14.6 % (ref 11.5–15.5)
WBC: 9.8 10*3/uL (ref 4.0–10.5)
nRBC: 0 % (ref 0.0–0.2)

## 2021-01-10 LAB — GLUCOSE, CAPILLARY
Glucose-Capillary: 200 mg/dL — ABNORMAL HIGH (ref 70–99)
Glucose-Capillary: 202 mg/dL — ABNORMAL HIGH (ref 70–99)
Glucose-Capillary: 167 mg/dL — ABNORMAL HIGH (ref 70–99)
Glucose-Capillary: 130 mg/dL — ABNORMAL HIGH (ref 70–99)

## 2021-01-10 SURGERY — THORACOTOMY, MAJOR
Anesthesia: General | Site: Chest | Laterality: Right

## 2021-01-10 MED ORDER — ACETAMINOPHEN 500 MG PO TABS: 1000.0000 mg | ORAL_TABLET | Freq: Four times a day (QID) | ORAL | Status: DC

## 2021-01-10 MED ORDER — FENTANYL CITRATE (PF) 250 MCG/5ML IJ SOLN
INTRAMUSCULAR | Status: AC
  Filled 2021-01-10: qty 5

## 2021-01-10 MED ORDER — PROPOFOL 10 MG/ML IV BOLUS
INTRAVENOUS | Status: AC
  Filled 2021-01-10: qty 20

## 2021-01-10 MED ORDER — DEXMEDETOMIDINE (PRECEDEX) IN NS 20 MCG/5ML (4 MCG/ML) IV SYRINGE
PREFILLED_SYRINGE | INTRAVENOUS | Status: AC
  Filled 2021-01-10: qty 5

## 2021-01-10 MED ORDER — SENNOSIDES-DOCUSATE SODIUM 8.6-50 MG PO TABS
1.0000 | ORAL_TABLET | Freq: Every day | ORAL | Status: DC
  Administered 2021-01-10 – 2021-01-16 (×7): 1 via ORAL
  Filled 2021-01-10 (×7): qty 1

## 2021-01-10 MED ORDER — CEFAZOLIN SODIUM-DEXTROSE 2-4 GM/100ML-% IV SOLN
2.0000 g | Freq: Three times a day (TID) | INTRAVENOUS | Status: DC
  Administered 2021-01-10: 2 g via INTRAVENOUS
  Filled 2021-01-10: qty 100

## 2021-01-10 MED ORDER — LIDOCAINE 2% (20 MG/ML) 5 ML SYRINGE
INTRAMUSCULAR | Status: DC | PRN
  Administered 2021-01-10: 100 mg via INTRAVENOUS

## 2021-01-10 MED ORDER — MIDAZOLAM HCL 5 MG/5ML IJ SOLN
INTRAMUSCULAR | Status: DC | PRN
  Administered 2021-01-10: 2 mg via INTRAVENOUS

## 2021-01-10 MED ORDER — DEXAMETHASONE SODIUM PHOSPHATE 10 MG/ML IJ SOLN
INTRAMUSCULAR | Status: DC | PRN
  Administered 2021-01-10: 5 mg via INTRAVENOUS

## 2021-01-10 MED ORDER — LACTATED RINGERS IV SOLN: INTRAVENOUS | Status: DC | PRN

## 2021-01-10 MED ORDER — PROPOFOL 10 MG/ML IV BOLUS
INTRAVENOUS | Status: DC | PRN
  Administered 2021-01-10: 50 mg via INTRAVENOUS
  Administered 2021-01-10: 150 mg via INTRAVENOUS

## 2021-01-10 MED ORDER — FENTANYL CITRATE (PF) 100 MCG/2ML IJ SOLN
INTRAMUSCULAR | Status: DC | PRN
  Administered 2021-01-10 (×6): 50 ug via INTRAVENOUS
  Administered 2021-01-10 (×2): 100 ug via INTRAVENOUS
  Administered 2021-01-11: 90 ug via INTRAVENOUS

## 2021-01-10 MED ORDER — ROCURONIUM BROMIDE 10 MG/ML (PF) SYRINGE
PREFILLED_SYRINGE | INTRAVENOUS | Status: AC
  Filled 2021-01-10: qty 10

## 2021-01-10 MED ORDER — 0.9 % SODIUM CHLORIDE (POUR BTL) OPTIME
TOPICAL | Status: DC | PRN
  Administered 2021-01-10: 2000 mL

## 2021-01-10 MED ORDER — MIDAZOLAM HCL 2 MG/2ML IJ SOLN
INTRAMUSCULAR | Status: AC
  Filled 2021-01-10: qty 2

## 2021-01-10 MED ORDER — INSULIN ASPART 100 UNIT/ML IJ SOLN
0.0000 [IU] | INTRAMUSCULAR | Status: DC
  Administered 2021-01-10: 4 [IU] via SUBCUTANEOUS
  Administered 2021-01-10 – 2021-01-11 (×2): 2 [IU] via SUBCUTANEOUS
  Administered 2021-01-11: 4 [IU] via SUBCUTANEOUS
  Administered 2021-01-11: 2 [IU] via SUBCUTANEOUS
  Administered 2021-01-11: 4 [IU] via SUBCUTANEOUS
  Administered 2021-01-11 (×2): 8 [IU] via SUBCUTANEOUS
  Administered 2021-01-12 (×2): 4 [IU] via SUBCUTANEOUS

## 2021-01-10 MED ORDER — DEXMEDETOMIDINE (PRECEDEX) IN NS 20 MCG/5ML (4 MCG/ML) IV SYRINGE
PREFILLED_SYRINGE | INTRAVENOUS | Status: DC | PRN
  Administered 2021-01-10: 20 ug via INTRAVENOUS

## 2021-01-10 MED ORDER — LIDOCAINE 2% (20 MG/ML) 5 ML SYRINGE
INTRAMUSCULAR | Status: AC
  Filled 2021-01-10: qty 5

## 2021-01-10 MED ORDER — ONDANSETRON HCL 4 MG/2ML IJ SOLN
INTRAMUSCULAR | Status: AC
  Filled 2021-01-10: qty 2

## 2021-01-10 MED ORDER — ONDANSETRON HCL 4 MG/2ML IJ SOLN
INTRAMUSCULAR | Status: DC | PRN
  Administered 2021-01-10: 4 mg via INTRAVENOUS

## 2021-01-10 MED ORDER — SODIUM CHLORIDE 0.9 % IV SOLN: INTRAVENOUS | Status: DC | PRN

## 2021-01-10 MED ORDER — SODIUM CHLORIDE (PF) 0.9 % IJ SOLN
INTRAMUSCULAR | Status: AC
  Filled 2021-01-10: qty 10

## 2021-01-10 MED ORDER — FLUTICASONE FUROATE-VILANTEROL 200-25 MCG/INH IN AEPB
1.0000 | INHALATION_SPRAY | Freq: Every day | RESPIRATORY_TRACT | Status: DC
  Filled 2021-01-10: qty 28

## 2021-01-10 MED ORDER — NALOXONE HCL 0.4 MG/ML IJ SOLN
0.4000 mg | INTRAMUSCULAR | Status: DC | PRN
  Filled 2021-01-10: qty 1

## 2021-01-10 MED ORDER — PROMETHAZINE HCL 25 MG/ML IJ SOLN: 6.2500 mg | INTRAMUSCULAR | Status: DC | PRN

## 2021-01-10 MED ORDER — DIPHENHYDRAMINE HCL 12.5 MG/5ML PO ELIX
12.5000 mg | ORAL_SOLUTION | Freq: Four times a day (QID) | ORAL | Status: DC | PRN
  Filled 2021-01-10: qty 5

## 2021-01-10 MED ORDER — ONDANSETRON HCL 4 MG/2ML IJ SOLN: 4.0000 mg | Freq: Four times a day (QID) | INTRAMUSCULAR | Status: DC | PRN

## 2021-01-10 MED ORDER — ROCURONIUM BROMIDE 10 MG/ML (PF) SYRINGE
PREFILLED_SYRINGE | INTRAVENOUS | Status: DC | PRN
  Administered 2021-01-10 (×2): 20 mg via INTRAVENOUS
  Administered 2021-01-10: 80 mg via INTRAVENOUS

## 2021-01-10 MED ORDER — HYDROMORPHONE HCL 1 MG/ML IJ SOLN
INTRAMUSCULAR | Status: DC | PRN
  Administered 2021-01-10 (×2): .5 mg via INTRAVENOUS

## 2021-01-10 MED ORDER — DEXAMETHASONE SODIUM PHOSPHATE 10 MG/ML IJ SOLN
INTRAMUSCULAR | Status: AC
  Filled 2021-01-10: qty 1

## 2021-01-10 MED ORDER — FENTANYL CITRATE (PF) 100 MCG/2ML IJ SOLN
INTRAMUSCULAR | Status: AC
  Administered 2021-01-10: 50 ug via INTRAVENOUS
  Filled 2021-01-10: qty 2

## 2021-01-10 MED ORDER — FENTANYL 50 MCG/ML IV PCA SOLN
INTRAVENOUS | Status: DC
Start: 2021-01-10 — End: 2021-01-11
  Administered 2021-01-10: 195 ug via INTRAVENOUS
  Administered 2021-01-10: 105 ug via INTRAVENOUS
  Administered 2021-01-11: 120 ug via INTRAVENOUS
  Administered 2021-01-11: 15 ug via INTRAVENOUS
  Administered 2021-01-11: 105 ug via INTRAVENOUS
  Filled 2021-01-10: qty 20

## 2021-01-10 MED ORDER — DIPHENHYDRAMINE HCL 50 MG/ML IJ SOLN
12.5000 mg | Freq: Four times a day (QID) | INTRAMUSCULAR | Status: DC | PRN
  Filled 2021-01-10: qty 0.25

## 2021-01-10 MED ORDER — FENTANYL CITRATE (PF) 100 MCG/2ML IJ SOLN
25.0000 ug | INTRAMUSCULAR | Status: DC | PRN
  Administered 2021-01-10 (×2): 25 ug via INTRAVENOUS

## 2021-01-10 MED ORDER — CEFAZOLIN SODIUM-DEXTROSE 2-3 GM-%(50ML) IV SOLR
INTRAVENOUS | Status: DC | PRN
  Administered 2021-01-10: 2 g via INTRAVENOUS

## 2021-01-10 MED ORDER — PHENYLEPHRINE HCL-NACL 10-0.9 MG/250ML-% IV SOLN
INTRAVENOUS | Status: DC | PRN
  Administered 2021-01-10: 50 ug/min via INTRAVENOUS

## 2021-01-10 MED ORDER — CEFAZOLIN SODIUM 1 G IJ SOLR
INTRAMUSCULAR | Status: AC
  Filled 2021-01-10: qty 20

## 2021-01-10 MED ORDER — BISACODYL 5 MG PO TBEC
10.0000 mg | DELAYED_RELEASE_TABLET | Freq: Every day | ORAL | Status: DC
  Administered 2021-01-11 – 2021-01-12 (×2): 10 mg via ORAL
  Filled 2021-01-10 (×2): qty 2

## 2021-01-10 MED ORDER — OXYCODONE HCL 5 MG PO TABS
5.0000 mg | ORAL_TABLET | ORAL | Status: DC | PRN
  Administered 2021-01-10: 5 mg via ORAL
  Administered 2021-01-11 – 2021-01-12 (×6): 10 mg via ORAL
  Filled 2021-01-10 (×2): qty 2
  Filled 2021-01-10: qty 1
  Filled 2021-01-10 (×4): qty 2

## 2021-01-10 MED ORDER — SODIUM CHLORIDE 0.9% FLUSH: 9.0000 mL | INTRAVENOUS | Status: DC | PRN

## 2021-01-10 MED ORDER — CEFAZOLIN SODIUM-DEXTROSE 2-4 GM/100ML-% IV SOLN
2.0000 g | Freq: Three times a day (TID) | INTRAVENOUS | Status: AC
  Administered 2021-01-10 – 2021-01-11 (×2): 2 g via INTRAVENOUS
  Filled 2021-01-10 (×2): qty 100

## 2021-01-10 MED ORDER — DEXTROSE IN LACTATED RINGERS 5 % IV SOLN: INTRAVENOUS | Status: DC

## 2021-01-10 MED ORDER — HYDROMORPHONE HCL 1 MG/ML IJ SOLN
INTRAMUSCULAR | Status: AC
  Filled 2021-01-10: qty 1

## 2021-01-10 MED ORDER — KETOROLAC TROMETHAMINE 15 MG/ML IJ SOLN
15.0000 mg | Freq: Four times a day (QID) | INTRAMUSCULAR | Status: DC | PRN
  Administered 2021-01-10 – 2021-01-13 (×11): 15 mg via INTRAVENOUS
  Filled 2021-01-10 (×11): qty 1

## 2021-01-10 MED ORDER — ACETAMINOPHEN 160 MG/5ML PO SOLN: 1000.0000 mg | Freq: Four times a day (QID) | ORAL | Status: DC

## 2021-01-10 MED ORDER — SUGAMMADEX SODIUM 200 MG/2ML IV SOLN
INTRAVENOUS | Status: DC | PRN
  Administered 2021-01-10: 200 mg via INTRAVENOUS

## 2021-01-10 SURGICAL SUPPLY — 60 items
BLADE CLIPPER SURG (BLADE) ×3 IMPLANT
CANISTER SUCT 3000ML PPV (MISCELLANEOUS) ×6 IMPLANT
CATH KIT ON-Q SILVERSOAK 5 (CATHETERS) IMPLANT
CATH KIT ON-Q SILVERSOAK 5IN (CATHETERS) IMPLANT
CATH THORACIC 28FR (CATHETERS) IMPLANT
CATH THORACIC 36FR (CATHETERS) IMPLANT
CATH THORACIC 36FR RT ANG (CATHETERS) IMPLANT
CLIP VESOCCLUDE MED 6/CT (CLIP) ×3 IMPLANT
CLIP VESOCCLUDE SM WIDE 6/CT (CLIP) ×3 IMPLANT
CONN ST 1/4X3/8  BEN (MISCELLANEOUS)
CONN ST 1/4X3/8 BEN (MISCELLANEOUS) IMPLANT
CONN Y 3/8X3/8X3/8  BEN (MISCELLANEOUS)
CONN Y 3/8X3/8X3/8 BEN (MISCELLANEOUS) IMPLANT
DERMABOND ADVANCED (GAUZE/BANDAGES/DRESSINGS)
DERMABOND ADVANCED .7 DNX12 (GAUZE/BANDAGES/DRESSINGS) IMPLANT
DRAIN CHANNEL 28F RND 3/8 FF (WOUND CARE) IMPLANT
DRAIN CHANNEL 32F RND 10.7 FF (WOUND CARE) ×2 IMPLANT
DRAPE INCISE IOBAN 66X45 STRL (DRAPES) ×1 IMPLANT
DRAPE LAPAROSCOPIC ABDOMINAL (DRAPES) ×3 IMPLANT
DRSG AQUACEL AG ADV 3.5X10 (GAUZE/BANDAGES/DRESSINGS) ×1 IMPLANT
ELECT REM PT RETURN 9FT ADLT (ELECTROSURGICAL) ×3
ELECTRODE REM PT RTRN 9FT ADLT (ELECTROSURGICAL) ×2 IMPLANT
GAUZE SPONGE 4X4 12PLY STRL (GAUZE/BANDAGES/DRESSINGS) ×3 IMPLANT
GLOVE SURG MICRO LTX SZ7 (GLOVE) ×6 IMPLANT
GOWN STRL REUS W/ TWL LRG LVL3 (GOWN DISPOSABLE) ×4 IMPLANT
GOWN STRL REUS W/ TWL XL LVL3 (GOWN DISPOSABLE) ×2 IMPLANT
GOWN STRL REUS W/TWL LRG LVL3 (GOWN DISPOSABLE) ×2
GOWN STRL REUS W/TWL XL LVL3 (GOWN DISPOSABLE) ×1
KIT BASIN OR (CUSTOM PROCEDURE TRAY) ×3 IMPLANT
KIT SUCTION CATH 14FR (SUCTIONS) ×1 IMPLANT
KIT TURNOVER KIT B (KITS) ×3 IMPLANT
NS IRRIG 1000ML POUR BTL (IV SOLUTION) ×9 IMPLANT
PACK CHEST (CUSTOM PROCEDURE TRAY) ×3 IMPLANT
PAD ARMBOARD 7.5X6 YLW CONV (MISCELLANEOUS) ×6 IMPLANT
PASSER SUT SWANSON 36MM LOOP (INSTRUMENTS) IMPLANT
SEALANT SURG COSEAL 4ML (VASCULAR PRODUCTS) IMPLANT
SEALANT SURG COSEAL 8ML (VASCULAR PRODUCTS) IMPLANT
SUT PROLENE 3 0 SH DA (SUTURE) IMPLANT
SUT SILK  1 MH (SUTURE) ×2
SUT SILK 1 MH (SUTURE) ×2 IMPLANT
SUT SILK 2 0SH CR/8 30 (SUTURE) ×3 IMPLANT
SUT SILK 3 0SH CR/8 30 (SUTURE) IMPLANT
SUT VIC AB 1 CTX 36 (SUTURE) ×1
SUT VIC AB 1 CTX36XBRD ANBCTR (SUTURE) ×2 IMPLANT
SUT VIC AB 2-0 CT1 27 (SUTURE)
SUT VIC AB 2-0 CT1 TAPERPNT 27 (SUTURE) IMPLANT
SUT VIC AB 2-0 CTX 36 (SUTURE) ×3 IMPLANT
SUT VIC AB 2-0 UR6 27 (SUTURE) IMPLANT
SUT VIC AB 3-0 MH 27 (SUTURE) IMPLANT
SUT VIC AB 3-0 SH 27 (SUTURE)
SUT VIC AB 3-0 SH 27X BRD (SUTURE) IMPLANT
SUT VIC AB 3-0 X1 27 (SUTURE) ×3 IMPLANT
SUT VICRYL 2 TP 1 (SUTURE) IMPLANT
SYSTEM SAHARA CHEST DRAIN ATS (WOUND CARE) ×3 IMPLANT
TAPE PAPER 3X10 WHT MICROPORE (GAUZE/BANDAGES/DRESSINGS) ×1 IMPLANT
TIP APPLICATOR SPRAY EXTEND 16 (VASCULAR PRODUCTS) IMPLANT
TOWEL GREEN STERILE (TOWEL DISPOSABLE) ×6 IMPLANT
TRAY FOLEY MTR SLVR 14FR STAT (SET/KITS/TRAYS/PACK) ×3 IMPLANT
TUNNELER SHEATH ON-Q 11GX8 DSP (PAIN MANAGEMENT) IMPLANT
WATER STERILE IRR 1000ML POUR (IV SOLUTION) ×6 IMPLANT

## 2021-01-10 NOTE — Progress Notes (Signed)
Family Medicine Teaching Service Daily Progress Note Intern Pager: [phone]  Patient name: Christopher Fernandez Medical record number: [id number] Date of birth: 13-Jul-1971 Age: 50 y.o. Gender: male  Primary Care Provider: Danna Hefty, DO Consultants: Neurosurgery, cardiothoracic surgery, ID   Code Status: Full Code  Pt Overview and Major Events to Date:  4/15: Admitted  4/22: T8-11 lateral fusion, T9-10 corpectomy, 1u pRBC 4/23: 2u pRBC 4/24: 1u pRBC 4/29: right thoracotomy with drainage of pleural effusion  Assessment and Plan: LENTON GENDREAU is a 50 y.o. male with worsening osteomyelitis and concern of new abscess/phlegmon in thoracic spine now s/p decompression of the thoracic spine with corpectomy reconstruction. PMH is significant forhistory of thoracic osteomyelitis, T2DM, peripheral neuropathy, chronic pancreatitis, HLD, HTN, asthma, seasonal allergies, GERD.  Osteomyelitis/discitiswithnew abscess/phlegmoninthoracic spine S/p T8-11 lateral fusion and T9-10 corpectomy on 4/22. Pain well controlled on current regimen. - neurosurgery and CTS following, appreciate recommendations - ID following, appreciate recommendations - IV daptomycin (4/15-) - IV rifampin (4/25-) - APAP 1000 mg q6h - meloxicam 15 mg daily - ketorolac 15 mg q6h prn - oxycodone 5-10 mg q4h prn - methocarbamol 500 mg q6h prn - duloxetine 60 mg daily - PT/OT: recommended rolling walker with 5" wheels and 3 in 1 bedside commode  Right loculated pleural effusion S/p right thoracotomy with drainage of pleural effusion and placement of chest tube this morning.  Breathing comfortably on 5 L [place]. - chest tube set to suction, -20 cm H2O - wean O2 as tolerated  Osteomyelitis of right hip Seen on most recent MRI.  Being treated with daptomycin as above. - IV daptomycin  T2DM Home medications include Januvia 100 mg daily, metformin 1000 mg twice daily, sliding scale insulin, 18 units insulin glargine in the  mornings, Jardiance 25 mg daily.Also on gabapentin for neuropathy.Most recent A1c 9.1. Adequately controlled, he has been refusing Lantus the past 2 days so we will discontinue for now as he has been controlled without it. - d/c Lantus for now - metformin 1000 mg BID - sSSI - monitor CBG - continue gabapentin - hold empagliflozin and Januvia  HTN On amlodipine 2.5 mg at home.  Normotensive. - hold amlodipine  Chronic pancreatitis On pancrelipase 3 times daily with meals. - continue Creon TID  Asthma and history of seasonal allergies Home medications include albuterol as needed, Advair daily, Singulair, Zyrtec. -Continue home medications -Dulera per formulary   History of alcohol abuse/tobacco use disorder Patient states his last drink was about 1.5 years ago. Continues to smoke about half pack per day, previously smoked about a pack per day for 35 years. -nicotine patch as desired  Anxiety Depression Home medications include duloxetine 60 mg daily, Abilify 5 mg daily.  May need to adjust aripiprazole dosing with rifampin starting. -Continue home medications  GERD Home medications include pantoprazole 40 mg twice daily -Continue home medications  History of tremors Home medications include propranolol - holding home propranolol  Left foot recent toe amputation Occurred on 3/23. 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 - Continue to monitor  Unintentional weight loss: Weight loss of about 30-35 pounds per patient on admission.Weight stable thus far. -daily weights -encourage diet -nutrition consulted  FEN/GI: carb modified diet PPx: LMWH  Disposition: progressive, plan for home with services when medically stable   Subjective:  NAOE.  Seen this afternoon after procedure.  Patient states procedure went well.  Reports no significant change in his dyspnea, but is hoping that he will improve  in the next few  days.  Objective: Temp:  [97.9 F (36.6 C)-98.7 F (37.1 C)] 98.5 F (36.9 C) (04/29 0400) Pulse Rate:  [83-113] 91 (04/29 0600) Resp:  [10-23] 12 (04/29 0600) BP: (111-149)/(76-98) 141/90 (04/29 0600) SpO2:  [90 %-98 %] 91 % (04/29 0600) Weight:  [109.8 kg] 109.8 kg (04/29 0446) Physical Exam: General: Obese middle-aged male resting comfortably in bed Cardiovascular: RRR, no murmurs Respiratory: Decreased breath sounds at the right lower base, otherwise clear to auscultation bilaterally, breathing comfortably on nasal cannula Abdomen: Soft, nontender, positive bowel sounds Extremities: WWP, no edema, s/p right TMA and left toe amputation   Laboratory: Recent Labs  Lab 01/08/21 0516 01/09/21 0450 01/10/21 0443  WBC 12.0* 9.5 9.8  HGB 9.9* 9.7* 9.5*  HCT 31.0* 30.2* 29.6*  PLT 415* 441* 474*   Recent Labs  Lab 01/07/21 0541 01/08/21 0516 01/10/21 0443  NA 133* 134* 134*  K 4.4 3.9 3.7  CL 99 99 96*  CO2 27 29 30   BUN 16 11 10   CREATININE 0.85 0.74 0.63  CALCIUM 8.5* 8.4* 8.1*  GLUCOSE 172* 94 131*      Imaging/Diagnostic Tests: DG Chest Port 1 View  Result Date: 01/10/2021 CLINICAL DATA:  Right pleural effusion.  Chest tube placement. EXAM: PORTABLE CHEST 1 VIEW COMPARISON:  01/09/2021 FINDINGS: A new chest tube is seen in the right lower hemithorax, with decreased size of small right pleural effusion since prior study. No pneumothorax visualized. Decreased atelectasis also seen in the right lung base. Right arm PICC line remains in appropriate position. Left lung remains clear. Heart size is stable. IMPRESSION: Decreased right pleural effusion and right basilar atelectasis following chest tube placement. No pneumothorax visualized. Electronically Signed   By: Marlaine Hind M.D.   On: 01/10/2021 10:53      Zola Button, MD 01/10/2021, 7:46 AM PGY-1, [place] Intern pager: [phone], text pages welcome

## 2021-01-10 NOTE — Anesthesia Procedure Notes (Signed)
Arterial Line Insertion Start/End4/29/2022 7:15 AM, 01/10/2021 7:31 AM Performed by: Audry Pili, MD, anesthesiologist  Patient location: Pre-op. Preanesthetic checklist: patient identified, IV checked, risks and benefits discussed, surgical consent, monitors and equipment checked, pre-op evaluation, timeout performed and anesthesia consent Lidocaine 1% used for infiltration and patient sedated Left, radial was placed Catheter size: 20 G Hand hygiene performed   Attempts: 3 (Previous attempts by CRNA unsuccessful) Procedure performed using ultrasound guided technique. Ultrasound Notes:anatomy identified, needle tip was noted to be adjacent to the nerve/plexus identified, no ultrasound evidence of intravascular and/or intraneural injection and image(s) printed for medical record Following insertion, dressing applied and Biopatch. Post procedure assessment: unchanged and normal  Patient tolerated the procedure well with no immediate complications.

## 2021-01-10 NOTE — Anesthesia Postprocedure Evaluation (Signed)
Anesthesia Post Note  Patient: Christopher Fernandez  Procedure(s) Performed: REDO THORACOTOMY (Right Chest) DRAINAGE OF PLEURAL EFFUSION (Right )     Patient location during evaluation: PACU Anesthesia Type: General Level of consciousness: awake and alert Pain management: pain level controlled Vital Signs Assessment: post-procedure vital signs reviewed and stable Respiratory status: spontaneous breathing, nonlabored ventilation, respiratory function stable and patient connected to nasal cannula oxygen Cardiovascular status: blood pressure returned to baseline, stable and tachycardic Postop Assessment: no apparent nausea or vomiting Anesthetic complications: no   No complications documented.  Last Vitals:  Vitals:   01/10/21 1040 01/10/21 1110  BP: 118/82 120/80  Pulse: (!) 105 (!) 106  Resp: 20 17  Temp:  36.8 C  SpO2: 94% 95%                  Audry Pili

## 2021-01-10 NOTE — Interval H&P Note (Signed)
History and Physical Interval Note:  01/10/2021 7:10 AM  Christopher Fernandez  has presented today for surgery, with the diagnosis of Right loculated effusion.  The various methods of treatment have been discussed with the patient and family. After consideration of risks, benefits and other options for treatment, the patient has consented to  Procedure(s): REDO THORACOTOMY (Right) DRAINAGE OF PLEURAL EFFUSION (Right) as a surgical intervention.  The patient's history has been reviewed, patient examined, no change in status, stable for surgery.  I have reviewed the patient's chart and labs.  Questions were answered to the patient's satisfaction.     Gaye Pollack

## 2021-01-10 NOTE — Progress Notes (Signed)
PT Cancellation Note  Patient Details Name: Christopher Fernandez MRN: [id number] DOB: 07-29-1971   Cancelled Treatment:    Reason Eval/Treat Not Completed: Medical issues which prohibited therapy. Pt with OR earlier today.   Shary Decamp [facility] 01/10/2021, 2:39 PM Hagen Bohorquez [place] Pager [phone] Office [phone]

## 2021-01-10 NOTE — Op Note (Signed)
01/10/2021 Christopher Fernandez [id number]  Surgeon: Gaye Pollack, MD   First Assistant: Enid Cutter, PA-C  Preoperative Diagnosis: Moderate loculated right pleural effusion  Postoperative Diagnosis: Same  Procedure:  1. Redo right thoracotomy  2. Drainage of loculated right pleural effusion.   Anesthesia: General Endotracheal   Clinical History/Surgical Indication:   This patient is a 50 year old gentleman who underwent right thoracotomy for spinal exposure 1 week ago for vertebrectomy of T9 and T10 for MRSA osteomyelitis with decompression of epidural abscess and reconstruction by Dr. Ellene Route.  He has done well but over the past few days developed shortness of breath and cough and a follow-up chest x-ray showed development of a new moderate right pleural effusion.  CTA of the chest showed a multiloculated right pleural effusion with compressive atelectasis of the right lower lobe.  I felt it was necessary to return the patient to the operating room for redo thoracotomy to allow complete drainage of this loculated pleural effusion to reexpand the right lung and prevent progressive development of an empyema.  I discussed the operative procedure with the patient and his wife who is a nurse including alternatives, benefits, and risks including but not limited to bleeding, blood transfusion, infection, persistent pleural space complications and they understood and agreed to proceed.  Preparation:  The patient was seen in the preoperative holding area and the correct patient, correct operation, correct operative sidewere confirmed with the patient after reviewing the medical record and CT scan. The consent was signed by me. Preoperative antibiotics were given. The right side of the chest was signed by me. The patient was taken back to the operating room and positioned supine on the operating room table. After being placed under general endotracheal anesthesia by the anesthesia team using a double  lumen tube a foley catheter was placed. The patient was turned into the left lateral decubitus position. The right chest was prepped with betadine soap and solution. A surgical time-out was taken and the correct patient,operative side, and operative procedure were confirmed with the nursing and anesthesia staff.   Operative Procedure:   The previous thoracotomy was opened.  There was about 750 cc of free serosanguineous fluid within the pleural space that was removed.  There is also a large amount of organized gelatinous material in the right lower hemithorax compressing the right lower lobe.  This was completely removed.  There was some inflammatory exudate on the surface of the lower lobe and this was removed.  There was complete hemostasis.  The site of the previous spinal reconstruction appeared hemostatic. This allowed complete expansion of the lung. The chest was irrigated with warm saline. Hemostasis was complete. Two 53 F Bard drains were placed through separate stab incisions and were positioned posteriorly and anteriorly in the pleural space. The ribs were reapproximated with # 2 vicryl pericostal sutures and the muscles closed with continuous #1 vicryl suture. The subcutaneous tissue was closed with 2-0 vicryl continuous suture. The skin was closed with 3-0 vicryl subcuticular suture. All sponge, needle, and instrument counts were reported correct at the end of the case. Dry sterile dressings were placed over the incisions and around the chest tubes which were connected to pleurevac suction. The patient was turned supine, extubated,then transported to the PACU in satisfactory and stable condition.

## 2021-01-10 NOTE — Plan of Care (Signed)
  Problem: Education: Goal: Knowledge of disease or condition will improve Outcome: Progressing Goal: Knowledge of the prescribed therapeutic regimen will improve Outcome: Progressing   Problem: Activity: Goal: Risk for activity intolerance will decrease Outcome: Progressing   Problem: Cardiac: Goal: Will achieve and/or maintain hemodynamic stability Outcome: Progressing   Problem: Clinical Measurements: Goal: Postoperative complications will be avoided or minimized Outcome: Progressing   Problem: Respiratory: Goal: Respiratory status will improve Outcome: Progressing   Problem: Skin Integrity: Goal: Wound healing without signs and symptoms infection will improve Outcome: Progressing

## 2021-01-10 NOTE — Transfer of Care (Signed)
Immediate Anesthesia Transfer of Care Note  Patient: ARDITH LEWMAN  Procedure(s) Performed: REDO THORACOTOMY (Right Chest) DRAINAGE OF PLEURAL EFFUSION (Right )  Patient Location: PACU  Anesthesia Type:General  Level of Consciousness: awake, alert  and oriented  Airway & Oxygen Therapy: Patient Spontanous Breathing and Patient connected to face mask oxygen  Post-op Assessment: Report given to RN, Post -op Vital signs reviewed and stable and Patient moving all extremities X 4  Post vital signs: Reviewed and stable  Last Vitals:  Vitals Value Taken Time  BP 114/75 01/10/21 1009  Temp    Pulse 108 01/10/21 1017  Resp 17 01/10/21 1017  SpO2 96 % 01/10/21 1017  Vitals shown include unvalidated device data.  Last Pain:  Vitals:   01/10/21 0400  TempSrc: Oral  PainSc: 3       Patients Stated Pain Goal: 4 (09/01/74 8832)  Complications: No complications documented.

## 2021-01-10 NOTE — Brief Op Note (Signed)
12/27/2020 - 01/10/2021  9:40 AM  PATIENT:  Christopher Fernandez  50 y.o. male  PRE-OPERATIVE DIAGNOSIS:  Right loculated effusion  POST-OPERATIVE DIAGNOSIS:  Right loculated effusion  PROCEDURE:  Procedure(s): REDO THORACOTOMY (Right) DRAINAGE OF PLEURAL EFFUSION (Right)  SURGEON:  Surgeon(s) and Role:    * Bentlie Withem, Fernande Boyden, MD - Primary  PHYSICIAN ASSISTANT: Enid Cutter, PA-C   ANESTHESIA:   general  EBL:  50 mL   BLOOD ADMINISTERED:none  DRAINS: (two 78F) Blake drain(s) in the right pleural space   LOCAL MEDICATIONS USED:  NONE  SPECIMEN:  No Specimen  DISPOSITION OF SPECIMEN:  N/A  COUNTS:  YES  TOURNIQUET:  * No tourniquets in log *  DICTATION: .Note written in EPIC  PLAN OF CARE: Admit to inpatient   PATIENT DISPOSITION:  PACU - hemodynamically stable.   Delay start of Pharmacological VTE agent (>24hrs) due to surgical blood loss or risk of bleeding: yes

## 2021-01-10 NOTE — Progress Notes (Signed)
OT Cancellation Note  Patient Details Name: Christopher Fernandez MRN: [id number] DOB: 06-02-1971   Cancelled Treatment:    Reason Eval/Treat Not Completed: Patient at procedure or test/ unavailable (surgery. Will return as schedule allows.)  [place], OTR/L Acute Rehab Pager: [phone] Office: [phone] 01/10/2021, 11:41 AM

## 2021-01-10 NOTE — Anesthesia Procedure Notes (Signed)
Procedure Name: Intubation Date/Time: 01/10/2021 7:52 AM Performed by: Kyung Rudd, CRNA Pre-anesthesia Checklist: Patient identified, Emergency Drugs available, Suction available and Patient being monitored Patient Re-evaluated:Patient Re-evaluated prior to induction Oxygen Delivery Method: Circle system utilized Preoxygenation: Pre-oxygenation with 100% oxygen Induction Type: IV induction Ventilation: Mask ventilation without difficulty Laryngoscope Size: Mac and 4 Grade View: Grade I Endobronchial tube: Left, Double lumen EBT, EBT position confirmed by auscultation and EBT position confirmed by fiberoptic bronchoscope and 39 Fr Number of attempts: 1 Airway Equipment and Method: Stylet Placement Confirmation: ETT inserted through vocal cords under direct vision,  positive ETCO2 and breath sounds checked- equal and bilateral Tube secured with: Tape Dental Injury: Teeth and Oropharynx as per pre-operative assessment

## 2021-01-10 NOTE — Progress Notes (Signed)
EVENING ROUNDS NOTE :     [address]       [place] [address]             [phone]                 Day of Surgery Procedure(s) (LRB): REDO THORACOTOMY (Right) DRAINAGE OF PLEURAL EFFUSION (Right)   Total Length of Stay:  LOS: 13 days  Events:   Resting comfortably Likely floor tomorrow     BP 121/88   Pulse 97   Temp 100.2 F (37.9 C) (Oral)   Resp 14   Ht 6\' 3"  (1.905 m)   Wt 109.8 kg   SpO2 95%   BMI 30.26 kg/m         . sodium chloride    .  ceFAZolin (ANCEF) IV    . DAPTOmycin (CUBICIN)  IV Stopped (01/09/21 2054)  . dextrose 5% lactated ringers 75 mL/hr at 01/10/21 1800  . methocarbamol (ROBAXIN) IV      I/O last 3 completed shifts: In: 2266.6 [P.O.:650; I.V.:1446.6; IV Piggyback:170] Out: 4200 [Urine:3300; Other:600; Blood:50; Chest Tube:250]   CBC Latest Ref Rng & Units 01/10/2021 01/09/2021 01/08/2021  WBC 4.0 - 10.5 K/uL 9.8 9.5 12.0(H)  Hemoglobin 13.0 - 17.0 g/dL 9.5(L) 9.7(L) 9.9(L)  Hematocrit 39.0 - 52.0 % 29.6(L) 30.2(L) 31.0(L)  Platelets 150 - 400 K/uL 474(H) 441(H) 415(H)    BMP Latest Ref Rng & Units 01/10/2021 01/10/2021 01/08/2021  Glucose 70 - 99 mg/dL 184(H) 131(H) 94  BUN 6 - 20 mg/dL 8 10 11   Creatinine 0.61 - 1.24 mg/dL 0.75 0.63 0.74  BUN/Creat Ratio 9 - 20 - - -  Sodium 135 - 145 mmol/L 134(L) 134(L) 134(L)  Potassium 3.5 - 5.1 mmol/L 4.2 3.7 3.9  Chloride 98 - 111 mmol/L 96(L) 96(L) 99  CO2 22 - 32 mmol/L 26 30 29   Calcium 8.9 - 10.3 mg/dL 7.8(L) 8.1(L) 8.4(L)    ABG    Component Value Date/Time   PHART 7.290 (L) 01/03/2021 2115   PCO2ART 61.2 (H) 01/03/2021 2115   PO2ART 271 (H) 01/03/2021 2115   HCO3 29.8 (H) 01/03/2021 2115   TCO2 32 01/03/2021 2115   O2SAT 100.0 01/03/2021 2115       Melodie Bouillon, MD 01/10/2021 7:05 PM

## 2021-01-10 NOTE — Brief Op Note (Signed)
01/10/2021  9:50 AM  PATIENT:  Christopher Fernandez  50 y.o. male  PRE-OPERATIVE DIAGNOSIS:  Right loculated effusion  POST-OPERATIVE DIAGNOSIS:  Right loculated effusion  PROCEDURE:   REDO THORACOTOMY (Right) DRAINAGE OF PLEURAL EFFUSION and PLEURAL DECORTICATION (Right)  SURGEON:  Gaye Pollack, MD - Primary  PHYSICIAN ASSISTANT: Jarod Bozzo  ANESTHESIA:   general  EBL:  50 mL   BLOOD ADMINISTERED:none  DRAINS: 32 fr right pleural Blake drains x 2   LOCAL MEDICATIONS USED:  NONE  SPECIMEN:  No Specimen  DISPOSITION OF SPECIMEN:  N/A  COUNTS:  YES  DICTATION: .Dragon Dictation  PLAN OF CARE: Admit to inpatient   PATIENT DISPOSITION:  PACU - hemodynamically stable.   Delay start of Pharmacological VTE agent (>24hrs) due to surgical blood loss or risk of bleeding: yes

## 2021-01-11 ENCOUNTER — Inpatient Hospital Stay (HOSPITAL_COMMUNITY): Payer: 59

## 2021-01-11 ENCOUNTER — Encounter (HOSPITAL_COMMUNITY): Payer: Self-pay | Admitting: Surgery

## 2021-01-11 DIAGNOSIS — M4620 Osteomyelitis of vertebra, site unspecified: Secondary | ICD-10-CM

## 2021-01-11 DIAGNOSIS — Z9689 Presence of other specified functional implants: Secondary | ICD-10-CM

## 2021-01-11 LAB — POCT I-STAT 7, (LYTES, BLD GAS, ICA,H+H)
Acid-Base Excess: 7 mmol/L — ABNORMAL HIGH (ref 0.0–2.0)
Bicarbonate: 31.7 mmol/L — ABNORMAL HIGH (ref 20.0–28.0)
Calcium, Ion: 1.11 mmol/L — ABNORMAL LOW (ref 1.15–1.40)
HCT: 30 % — ABNORMAL LOW (ref 39.0–52.0)
Hemoglobin: 10.2 g/dL — ABNORMAL LOW (ref 13.0–17.0)
O2 Saturation: 96 %
Patient temperature: 97.9
Potassium: 3.5 mmol/L (ref 3.5–5.1)
Sodium: 136 mmol/L (ref 135–145)
TCO2: 33 mmol/L — ABNORMAL HIGH (ref 22–32)
pCO2 arterial: 42 mmHg (ref 32.0–48.0)
pH, Arterial: 7.484 — ABNORMAL HIGH (ref 7.350–7.450)
pO2, Arterial: 78 mmHg — ABNORMAL LOW (ref 83.0–108.0)

## 2021-01-11 LAB — BASIC METABOLIC PANEL
Anion gap: 9 (ref 5–15)
BUN: 5 mg/dL — ABNORMAL LOW (ref 6–20)
CO2: 29 mmol/L (ref 22–32)
Calcium: 7.9 mg/dL — ABNORMAL LOW (ref 8.9–10.3)
Chloride: 94 mmol/L — ABNORMAL LOW (ref 98–111)
Creatinine, Ser: 0.66 mg/dL (ref 0.61–1.24)
GFR, Estimated: >60 mL/min (ref >60)
Glucose, Bld: 172 mg/dL — ABNORMAL HIGH (ref 70–99)
Potassium: 3.4 mmol/L — ABNORMAL LOW (ref 3.5–5.1)
Sodium: 132 mmol/L — ABNORMAL LOW (ref 135–145)

## 2021-01-11 LAB — GLUCOSE, CAPILLARY
Glucose-Capillary: 150 mg/dL — ABNORMAL HIGH (ref 70–99)
Glucose-Capillary: 160 mg/dL — ABNORMAL HIGH (ref 70–99)
Glucose-Capillary: 223 mg/dL — ABNORMAL HIGH (ref 70–99)
Glucose-Capillary: 174 mg/dL — ABNORMAL HIGH (ref 70–99)
Glucose-Capillary: 170 mg/dL — ABNORMAL HIGH (ref 70–99)
Glucose-Capillary: 220 mg/dL — ABNORMAL HIGH (ref 70–99)

## 2021-01-11 LAB — CBC
HCT: 29.8 % — ABNORMAL LOW (ref 39.0–52.0)
Hemoglobin: 9.7 g/dL — ABNORMAL LOW (ref 13.0–17.0)
MCH: 28.1 pg (ref 26.0–34.0)
MCHC: 32.6 g/dL (ref 30.0–36.0)
MCV: 86.4 fL (ref 80.0–100.0)
Platelets: 568 10*3/uL — ABNORMAL HIGH (ref 150–400)
RBC: 3.45 MIL/uL — ABNORMAL LOW (ref 4.22–5.81)
RDW: 14.6 % (ref 11.5–15.5)
WBC: 11.6 10*3/uL — ABNORMAL HIGH (ref 4.0–10.5)
nRBC: 0 % (ref 0.0–0.2)

## 2021-01-11 LAB — MISC LABCORP TEST (SEND OUT)
LabCorp test name: 1
Labcorp test code: 96388

## 2021-01-11 MED ORDER — POTASSIUM CHLORIDE CRYS ER 20 MEQ PO TBCR
20.0000 meq | EXTENDED_RELEASE_TABLET | ORAL | Status: AC
  Administered 2021-01-11 (×3): 20 meq via ORAL
  Filled 2021-01-11 (×4): qty 1

## 2021-01-11 MED ORDER — MELOXICAM 7.5 MG PO TABS: 15.0000 mg | ORAL_TABLET | Freq: Every day | ORAL | Status: DC

## 2021-01-11 MED ORDER — CHLORHEXIDINE GLUCONATE CLOTH 2 % EX PADS
6.0000 | MEDICATED_PAD | Freq: Every day | CUTANEOUS | Status: DC
  Administered 2021-01-11 – 2021-01-17 (×7): 6 via TOPICAL

## 2021-01-11 MED ORDER — FLUTICASONE FUROATE-VILANTEROL 200-25 MCG/INH IN AEPB
1.0000 | INHALATION_SPRAY | Freq: Every day | RESPIRATORY_TRACT | Status: DC
  Administered 2021-01-12 – 2021-01-17 (×6): 1 via RESPIRATORY_TRACT

## 2021-01-11 NOTE — Progress Notes (Signed)
Pt admitted to [facility] from [place].  Pt is A&OX4 and neuro intact.  Vitals taken and all within normal range.  Pt placed on telemetry and CCMD notified.  CHG bath completed.  Pt states a pain level of 5 out of 10.

## 2021-01-11 NOTE — Progress Notes (Signed)
Pharmacy Antibiotic Note  Christopher Fernandez is a 51 y.o. male admitted on 12/27/2020 with back pain and worsening epidural abscess on MRI 4/13. 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 Pharmacy has been consulted to continue Daptomycin.   S/p thoracotomy with drainage and chest tube on 4/29. Patient with tmax of 100.2 overnight. WBC up slightly to 11 this morning. Renal function normal.   CK checks every Wednesday: 42 (3/22) >> 46 (3/28) >> 224 (4/4) >> 38 (4/16) >> 565 (4/23) ?up d/t surgery> 120 4/27  Plan: Continue Daptomycin 1000 mg every 24 hours for 8 additional weeks (end date 6/17) Rifampin 300 mg bid (end date 6/17) Doxy 100 bid - follow up length of therapy CK every Monday Monitor renal function  Monitor signs/symptoms of worsening infection   Height: 6\' 3"  (190.5 cm) Weight: 109.8 kg (242 lb 1 oz) IBW/kg (Calculated) : 84.5  Temp (24hrs), Avg:98.6 F (37 C), Min:97.9 F (36.6 C), Max:100.2 F (37.9 C)  Recent Labs  Lab 01/07/21 0541 01/08/21 0516 01/09/21 0450 01/10/21 0443 01/10/21 1300 01/11/21 0313  WBC 12.0* 12.0* 9.5 9.8  --  11.6*  CREATININE 0.85 0.74  --  0.63 0.75 0.66    Estimated Creatinine Clearance: 144.5 mL/min (by C-G formula based on SCr of 0.66 mg/dL).    Allergies  Allergen Reactions  . Eggs Or Egg-Derived Products Rash  . Morphine And Related Other (See Comments)    Cant take because of pancreatitis  . Cocoa Rash    Thank you for allowing pharmacy to be a part of this patient's care.  Erin Hearing PharmD., BCPS Clinical Pharmacist 01/11/2021 10:28 AM

## 2021-01-11 NOTE — Progress Notes (Signed)
Pt seen and examined. Has minimal c/o back pain. No SOB. Overall reports feeling better than yesterday. Ambulating in unit. On exam MAE well. Remains stable from neurological standpoint. Cont abx, supportive care per primary service, CT mgmt per CT Surgery. Cont therapy services.  Consuella Lose, MD [facility]

## 2021-01-11 NOTE — Progress Notes (Signed)
      [address]       [place] [address]             [phone]                 1 Day Post-Op Procedure(s) (LRB): REDO THORACOTOMY (Right) DRAINAGE OF PLEURAL EFFUSION (Right)   Events: No events Ambulating well _______________________________________________________________ Vitals: BP 120/79   Pulse 93   Temp 97.9 F (36.6 C) (Axillary)   Resp 14   Ht 6\' 3"  (1.905 m)   Wt 109.8 kg   SpO2 95%   BMI 30.26 kg/m   - Neuro: alert NAD  - Cardiovascular: sinus.      - Pulm: EWOB  370 from CT     ABG    Component Value Date/Time   PHART 7.484 (H) 01/11/2021 0316   PCO2ART 42.0 01/11/2021 0316   PO2ART 78 (L) 01/11/2021 0316   HCO3 31.7 (H) 01/11/2021 0316   TCO2 33 (H) 01/11/2021 0316   O2SAT 96.0 01/11/2021 0316    - Abd: ND - Extremity: trace edema  .Intake/Output      04/29 0701 04/30 0700 04/30 0701 05/01 0700   P.O. 240    I.V. (mL/kg) 2379.3 (21.7) 75 (0.7)   IV Piggyback 420.2    Total Intake(mL/kg) 3039.4 (27.7) 75 (0.7)   Urine (mL/kg/hr) 3165 (1.2)    Other 600    Blood 50    Chest Tube 370    Total Output 4185    Net -1145.6 +75           _______________________________________________________________ Labs: CBC Latest Ref Rng & Units 01/11/2021 01/11/2021 01/10/2021  WBC 4.0 - 10.5 K/uL - 11.6(H) 9.8  Hemoglobin 13.0 - 17.0 g/dL 10.2(L) 9.7(L) 9.5(L)  Hematocrit 39.0 - 52.0 % 30.0(L) 29.8(L) 29.6(L)  Platelets 150 - 400 K/uL - 568(H) 474(H)   CMP Latest Ref Rng & Units 01/11/2021 01/11/2021 01/10/2021  Glucose 70 - 99 mg/dL - 172(H) 184(H)  BUN 6 - 20 mg/dL - 5(L) 8  Creatinine 0.61 - 1.24 mg/dL - 0.66 0.75  Sodium 135 - 145 mmol/L 136 132(L) 134(L)  Potassium 3.5 - 5.1 mmol/L 3.5 3.4(L) 4.2  Chloride 98 - 111 mmol/L - 94(L) 96(L)  CO2 22 - 32 mmol/L - 29 26  Calcium 8.9 - 10.3 mg/dL - 7.9(L) 7.8(L)  Total Protein 6.5 - 8.1 g/dL - - -  Total Bilirubin 0.3 - 1.2 mg/dL - - -  Alkaline Phos 38 - 126 U/L - - -   AST 15 - 41 U/L - - -  ALT 0 - 44 U/L - - -    CXR: Small Right effusion  _______________________________________________________________  Assessment and Plan: POD 1 s/p redo thoracotomy for loculated pleural effusion  Neuro: will d/c fent pca CV: stable Pulm: continue pulm toilet.  Will keep CT for now Renal: stable Heme: stable ID: on abx for osteo Dispo: floor today.  Rest per primary   Christopher Fernandez O Christopher Fernandez 01/11/2021 10:27 AM

## 2021-01-11 NOTE — Progress Notes (Signed)
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 Chest tube in place  As of 4/30, Fever curve improving, but Thrombocytosis and Wbc slight uptik unclear significance, likely reactive   -continue dapto/rifampin -- duration as previously stated -continue doxycycline -the role of further doxycycline will be determined by dr West Bali who will resume rounding on Monday 5/02   I spent more than 35 minute reviewing data/chart, and coordinating care and >50% direct face to face time providing counseling/discussing diagnostics/treatment plan with patient  _____________________________________________________________________ Subjective Doing better since thoracotomy. Chest pain minimal to none/controlled. Asking when he can go home No n/v/diarrhea Eating better  Reviewed labs with patient. Reassured he is clinically feeling better   Vitals BP (!) 142/93   Pulse 94   Temp 97.9 F (36.6 C) (Axillary)   Resp 13   Ht 6\' 3"  (1.905 m)   Wt 109.8 kg   SpO2 95%   BMI 30.26 kg/m     Physical Exam General/constitutional: no distress, pleasant HEENT: Normocephalic, PER, Conj Clear, EOMI, Oropharynx clear Neck supple CV: rrr no  mrg Lungs: clear to auscultation, normal respiratory effort; chest tube in place,  Functioning, on wall suction, no air leak Abd: Soft, Nontender Ext: no edema Skin: No Rash Neuro: nonfocal MSK: no peripheral joint swelling/tenderness/warmth; back spines nontender     Pertinent Microbiology Results for orders placed or performed during the hospital encounter of 12/27/20  SARS CORONAVIRUS 2 (TAT 6-24 HRS) Nasopharyngeal Nasopharyngeal Swab     Status: None   Collection Time: 12/27/20  6:16 AM   Specimen: Nasopharyngeal Swab  Result Value Ref Range Status   SARS Coronavirus 2 NEGATIVE NEGATIVE Final    Comment: (NOTE) SARS-CoV-2 target nucleic acids are NOT DETECTED.  The SARS-CoV-2 RNA is generally detectable in upper and lower respiratory specimens during the acute phase of infection. Negative results do not preclude SARS-CoV-2 infection, do not rule out co-infections with other pathogens, and should not be used as the sole basis for treatment or other patient management decisions. Negative results must be combined with clinical observations, patient history, and epidemiological information. The expected result is Negative.  Fact Sheet for Patients: SugarRoll.be  Fact Sheet for Healthcare Providers: https://www.woods-mathews.com/  This test is not yet approved or cleared by the [place] FDA and  has been authorized for detection and/or diagnosis of SARS-CoV-2 by FDA under an Emergency Use Authorization (EUA). This EUA will remain  in effect (meaning this test can be used) for the duration of the COVID-19 declaration under Se ction 564(b)(1) of the Act, 21 U.S.C. section 360bbb-3(b)(1), unless the authorization is terminated or revoked sooner.  Performed at [facility], [place] [address] [place], [place] [address]  Blood culture (routine single)     Status: None   Collection Time: 12/27/20  6:29 AM   Specimen: BLOOD  LEFT HAND  Result Value Ref Range Status   Specimen Description BLOOD LEFT HAND  Final   Special Requests   Final    BOTTLES DRAWN AEROBIC AND ANAEROBIC Blood Culture results may not be optimal due to an inadequate volume of blood received in culture bottles   Culture   Final    NO GROWTH 5 DAYS Performed at [facility], [place] [address] [place], [place] [address]    Report Status 01/01/2021 FINAL  Final  Urine culture     Status: Abnormal   Collection Time: 12/27/20  9:26 AM   Specimen: In/Out Cath Urine  Result Value Ref Range Status   Specimen Description IN/OUT CATH URINE  Final   Special Requests NONE  Final   Culture (A)  Final    1,000 COLONIES/mL DIPHTHEROIDS(CORYNEBACTERIUM SPECIES) Standardized susceptibility testing for this organism is not available. Performed at [facility], [place] [address], [place], [place] [address]    Report Status 12/28/2020 FINAL  Final  Surgical pcr screen     Status: None   Collection Time: 12/30/20 10:49 PM   Specimen: Nasal Mucosa; Nasal Swab  Result Value Ref Range Status   MRSA, PCR NEGATIVE NEGATIVE Final   Staphylococcus aureus NEGATIVE NEGATIVE Final    Comment: (NOTE) The Xpert SA Assay (FDA approved for NASAL specimens in patients 41 years of age and older), is one component of a comprehensive surveillance program. It is not intended to diagnose infection nor to guide or monitor treatment. Performed at [facility], [place] [name] [place], [place] [address]   Aerobic/Anaerobic Culture w Gram Stain (surgical/deep wound)     Status: None (Preliminary result)   Collection Time: 01/03/21  6:37 PM   Specimen: Wound  Result Value Ref Range Status   Specimen Description WOUND  Final   Special Requests 10 THORACIC 9  Final   Gram Stain   Final    FEW WBC PRESENT,BOTH PMN AND MONONUCLEAR NO ORGANISMS SEEN    Culture   Final    RARE METHICILLIN RESISTANT STAPHYLOCOCCUS AUREUS Sent to [place] for further  susceptibility testing. NO ANAEROBES ISOLATED Performed at [facility], [place] [address], [place],  [address]    Report Status PENDING  Incomplete   Organism ID, Bacteria METHICILLIN RESISTANT STAPHYLOCOCCUS AUREUS  Final      Susceptibility   Methicillin resistant staphylococcus aureus - MIC*    CIPROFLOXACIN <=0.5 SENSITIVE Sensitive     ERYTHROMYCIN >=8 RESISTANT Resistant     GENTAMICIN <=0.5 SENSITIVE Sensitive     OXACILLIN >=4 RESISTANT Resistant     TETRACYCLINE <=1 SENSITIVE Sensitive     VANCOMYCIN 1 SENSITIVE Sensitive     TRIMETH/SULFA <=10 SENSITIVE Sensitive     CLINDAMYCIN <=0.25 SENSITIVE Sensitive     RIFAMPIN <=0.5 SENSITIVE Sensitive     Inducible Clindamycin NEGATIVE Sensitive     * RARE METHICILLIN RESISTANT STAPHYLOCOCCUS AUREUS  Culture, blood (routine x 2)     Status: None (Preliminary result)   Collection Time: 01/08/21  4:47 PM   Specimen: BLOOD  Result Value Ref Range Status   Specimen Description BLOOD LEFT ANTECUBITAL  Final   Special Requests   Final    BOTTLES DRAWN AEROBIC AND ANAEROBIC Blood Culture adequate volume   Culture   Final    NO GROWTH 3  DAYS Performed at [facility], [place] [address], [place], [place] [address]    Report Status PENDING  Incomplete  Culture, blood (routine x 2)     Status: None (Preliminary result)   Collection Time: 01/08/21  4:48 PM   Specimen: BLOOD LEFT HAND  Result Value Ref Range Status   Specimen Description BLOOD LEFT HAND  Final   Special Requests   Final    BOTTLES DRAWN AEROBIC AND ANAEROBIC Blood Culture adequate volume   Culture   Final    NO GROWTH 3 DAYS Performed at [facility], [place] [address] [place], [place] [address]    Report Status PENDING  Incomplete    Pertinent Lab. CBC Latest Ref Rng & Units 01/11/2021 01/11/2021 01/10/2021  WBC 4.0 - 10.5 K/uL - 11.6(H) 9.8  Hemoglobin 13.0 - 17.0 g/dL 10.2(L) 9.7(L) 9.5(L)  Hematocrit 39.0 - 52.0 % 30.0(L) 29.8(L) 29.6(L)   Platelets 150 - 400 K/uL - 568(H) 474(H)   CMP Latest Ref Rng & Units 01/11/2021 01/11/2021 01/10/2021  Glucose 70 - 99 mg/dL - 172(H) 184(H)  BUN 6 - 20 mg/dL - 5(L) 8  Creatinine 0.61 - 1.24 mg/dL - 0.66 0.75  Sodium 135 - 145 mmol/L 136 132(L) 134(L)  Potassium 3.5 - 5.1 mmol/L 3.5 3.4(L) 4.2  Chloride 98 - 111 mmol/L - 94(L) 96(L)  CO2 22 - 32 mmol/L - 29 26  Calcium 8.9 - 10.3 mg/dL - 7.9(L) 7.8(L)  Total Protein 6.5 - 8.1 g/dL - - -  Total Bilirubin 0.3 - 1.2 mg/dL - - -  Alkaline Phos 38 - 126 U/L - - -  AST 15 - 41 U/L - - -  ALT 0 - 44 U/L - - -     Pertinent Imaging today Plain films and CT images have been personally visualized and interpreted; radiology reports have been reviewed. Decision making incorporated into the Impression / Recommendations.  4/30 cxr FINDINGS: Thoracic spine hardware remains. The cardiomediastinal silhouette is stable. A right PICC line terminates in the central SVC. No pneumothorax identified. There is a small right pleural effusion with underlying opacity, more pronounced in the interval. The left lung remains clear.  IMPRESSION: 1. Support apparatus as above. 2. There is a small right pleural effusion with associated opacity more pronounced in the interval. The associated opacity may represent worsening atelectasis or developing infiltrate. Recommend attention on follow-up. 3. No other change      Chest Xray 01/09/21 FINDINGS: Similar versus slightly increased moderate loculated right pleural effusion. Small left pleural effusion better characterized on chest CT. Similar overlying right basilar opacities. Left lung is clear. No visible pneumothorax. Stable cardiomediastinal silhouette. Right upper extremity PICC with the tip projecting at the lower SVC. Lower thoracic fixation with corpectomy and cage.  IMPRESSION: Similar versus slightly increased moderate loculated right pleural effusion. Similar overlying right basilar  opacities, likely compressive atelectasis.    CT Angio Chest PE 01/08/21 FINDINGS: Cardiovascular: Satisfactory opacification of the pulmonary arteries to the segmental level. No evidence of pulmonary embolism. Normal heart size. No pericardial effusion.  Mediastinum/Nodes: No enlarged mediastinal, hilar, or axillary lymph nodes. Thyroid gland, trachea, and esophagus demonstrate no significant findings.  Lungs/Pleura: Small left pleural effusion. Moderate-sized loculated right pleural effusion. Interval removal of the right-sided chest tube. Small residual air in the right pleural fluid adjacent to the resected eighth rib at the site of the chest tube insertion. Right lower lobe compressive atelectasis. No pneumothorax.  Upper Abdomen:  Musculoskeletal:  Right posterolateral eighth rib resection. Prior T9 corpectomy transfixed with a cage extending from T8 through T10.  Review of the MIP images confirms the above findings.  IMPRESSION: 1. No evidence of pulmonary embolus. 2. New moderate-sized loculated right pleural effusion. Interval removal of the right-sided chest tube. Small residual air in the right pleural fluid adjacent to the resected eighth rib at the site of the chest tube insertion. Right lower lobe compressive atelectasis. 3. Small left pleural effusion. 4. Right posterolateral eighth rib resection. Prior T9 corpectomy transfixed with a cage extending from T8 through T10.        Jabier Mutton, [place] for [place] [phone]  pager   [phone] cell 01/11/2021, 1:08 PM

## 2021-01-11 NOTE — Progress Notes (Signed)
Family Medicine Teaching Service Daily Progress Note Intern Pager: [phone]  Patient name: Christopher Fernandez Medical record number: [id number] Date of birth: [birth year] Age: 50 y.o. Gender: male  Primary Care Provider: Danna Hefty, DO Consultants: Neurosurgery, CT surgery, ID Code Status: Full   Pt Overview and Major Events to Date:  4/15: Admitted  4/22: T8-11 lateral fusion, T9-10 corpectomy, 1u pRBC 4/23: 2u pRBC 4/24: 1u pRBC 4/29: right thoracotomy with drainage of pleural effusion  Assessment and Plan: Christopher Fernandez is a 50 y.o. male with worsening osteomyelitis and concern of new abscess/phlegmon in thoracic spine now s/p decompression of the thoracic spine with corpectomy reconstruction. PMH is significant forhistory of thoracic osteomyelitis, T2DM, peripheral neuropathy, chronic pancreatitis, HLD, HTN, asthma, seasonal allergies, GERD.  Osteomyelitis/discitiswithnew abscess/phlegmoninthoracic spine S/p T8-11 lateral fusion and T9-10 corpectomy on 4/22. Endorses pain well controlled under current regimen. -neurosurgery and CTS following, appreciate recommendations -ID following, appreciate continue involvement  -IV daptomycin (4/15-) -IV rifampin (4/25-) -acetaminophen 1000 mg q6h -meloxicam 15 mg daily -ketorolac 15 mg q6h prn -oxycodone 5-10 mg q4h prn -fentanyl 50 mcg/mL PCA q4h per CT surgery -methocarbamol 500 mg q6h prn -duloxetine 60 mg daily -PT/OT: recommended rolling walker with 5" wheels and 3 in 1 bedside commode  Right loculated pleural effusion S/p right thoracotomy with drainage of pleural effusion and placement of chest tube 4/29.  Breathing comfortably on 2L O2.  -chest tube in place -wean O2 as tolerated  Osteomyelitis of right hip Seen on most recent MRI.   -IV daptomycin per ID, as above   T2DM Home medications include Januvia 100 mg daily, metformin 1000 mg twice daily, sliding scale insulin, 18 units insulin glargine in the  mornings, Jardiance 25 mg daily.Also on gabapentin for neuropathy.Most recent A1c 9.1.  -lantus temporarily discontinued given patient refusal -sSSI -monitor CBGs -continue gabapentin  HTN Normotensive BP. Home meds include amlodipine 2.5 mg daily.  -hold amlodipine -continue to monitor BP  Chronic pancreatitis  Pancreatic insufficiency  On pancrelipase 3 times daily with meals. -continue Creon TID  Asthma and history of seasonal allergies Home medications include albuterol as needed, Advair daily, Singulair, Zyrtec. -Continue home medications -Dulera per formulary   History of alcohol abuse/tobacco use disorder Patient states his last drink was about 1.5 years ago. Continues to smoke about half pack per day, previously smoked about a pack per day for 35 years. -nicotine patch as desired  Anxiety Depression Home medications include duloxetine 60 mg daily, Abilify 5 mg daily.  May need to adjust aripiprazole dosing with rifampin starting. -continue home meds  GERD Home medications include pantoprazole 40 mg twice daily -continue protonix   History of tremors Home medications include propranolol -holding home propranolol  Left foot recent toe amputation Occurred on 3/23. 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 -monitor clinically   Unintentional weight loss: Weight loss of about 30-35 pounds per patient on admission.Weight stable thus far. -nutrition recs  -daily weights -encourage diet   FEN/GI: carb modified diet  PPx: LMWH   Status is: Inpatient  Remains inpatient appropriate because:Ongoing diagnostic testing needed not appropriate for outpatient work up   Dispo:  Patient From: Home  Planned Disposition: Home with Health Care Svc  Medically stable for discharge: No          Subjective:  No acute overnight events reported. Patient denies dyspnea, chest pain or other pain. Endorses that his pain is  currently well under control. He is eager to get  home and wants to feel better, I encouraged him that we are getting there and he will hopefully continue to demonstrate continued improvement. Reassurance provided. He has no further questions or concerns at this time. Discussed this with nurse as well.   Objective: Temp:  [98 F (36.7 C)-100.2 F (37.9 C)] 98.7 F (37.1 C) (04/29 2000) Pulse Rate:  [83-110] 99 (04/29 2300) Resp:  [11-22] 21 (04/29 2300) BP: (113-149)/(74-98) 140/82 (04/29 2300) SpO2:  [89 %-100 %] 96 % (04/29 2300) Arterial Line BP: (84-154)/(54-77) 136/75 (04/29 2300) Weight:  [109.8 kg] 109.8 kg (04/29 0446) Physical Exam: General: Patient sitting upright in bed, in no acute distress. Cardiovascular: RRR Respiratory: normal respiratory effort, in no acute distress, breathing comfortably on 2L O2 Abdomen: soft, nontender Extremities: radial and distal pulses strong and equal bilaterally, no LE edema noted bilaterally, s/p right TMA Psych: mood appropriate   Laboratory: Recent Labs  Lab 01/08/21 0516 01/09/21 0450 01/10/21 0443  WBC 12.0* 9.5 9.8  HGB 9.9* 9.7* 9.5*  HCT 31.0* 30.2* 29.6*  PLT 415* 441* 474*   Recent Labs  Lab 01/08/21 0516 01/10/21 0443 01/10/21 1300  NA 134* 134* 134*  K 3.9 3.7 4.2  CL 99 96* 96*  CO2 29 30 26   BUN 11 10 8   CREATININE 0.74 0.63 0.75  CALCIUM 8.4* 8.1* 7.8*  GLUCOSE 94 131* 184*      Imaging/Diagnostic Tests: DG Chest Port 1 View  Result Date: 01/10/2021 CLINICAL DATA:  Right pleural effusion.  Chest tube placement. EXAM: PORTABLE CHEST 1 VIEW COMPARISON:  01/09/2021 FINDINGS: A new chest tube is seen in the right lower hemithorax, with decreased size of small right pleural effusion since prior study. No pneumothorax visualized. Decreased atelectasis also seen in the right lung base. Right arm PICC line remains in appropriate position. Left lung remains clear. Heart size is stable. IMPRESSION: Decreased right  pleural effusion and right basilar atelectasis following chest tube placement. No pneumothorax visualized. Electronically Signed   By: Marlaine Hind M.D.   On: 01/10/2021 10:53    Donney Dice, DO 01/11/2021, 12:50 AM PGY-1, [facility] Intern pager: [phone], text pages welcome

## 2021-01-11 NOTE — Progress Notes (Signed)
Physical Therapy Treatment Patient Details Name: Christopher Fernandez MRN: [id number] DOB: January 09, 1971 Today's Date: 01/11/2021    History of Present Illness Christopher Fernandez is a 50 y.o. male presenting with worsening osteomyelitis and concern of new abscess/phlegmon in thoracic spine after having a repeat MRI a few days ago. MRI evidence from 12/11/2010 worsening osteomyelitis of the parasymphyseal right superior pubic ramus with improvement in right abductor minimus myositis.s/p right-sided T7 thoracotomy with vertebrectomy of T9 and T10 for osteomyelitis decompression of epidural abscess reconstruction with Exe-Cort titanium implant and lateral plate fixation with traverse plate local autograft from T7 rib.  01/10/21 underwent redo R thoracotomy due to loculated R pleural effusion.  PMH is significant for history of thoracic osteomyelitis, type 2 diabetes, peripheral neuropathy, chronic pancreatitis/pancreatic insufficiency, hyperlipidemia, hypertension, asthma, seasonal allergies, GERD.    PT Comments    Patient progressing with ambulation despite return to OR yesterday for re-do thoracotomy.  He is eager to progress and not let this set him back.  Did utilize [name] walker due to chest tube and O2 requirement.  Patient will continue to benefit from skilled PT to maximize mobility and safety prior to d/c home.    Follow Up Recommendations  Home health PT     Equipment Recommendations  Rolling walker with 5" wheels;3in1 (PT)    Recommendations for Other Services       Precautions / Restrictions Precautions Precautions: Back Precaution Comments: R chest tube, 3L O2    Mobility  Bed Mobility   Bed Mobility: Sit to Supine       Sit to supine: Mod assist   General bed mobility comments: assist for legs and positioning    Transfers Overall transfer level: Needs assistance Equipment used: 4-wheeled walker (eva walker) Transfers: Sit to/from Stand Sit to Stand: Min guard         General  transfer comment: assist for lines, steadying  Ambulation/Gait Ambulation/Gait assistance: Min guard Gait Distance (Feet): 400 Feet Assistive device: 4-wheeled walker (eva walker) Gait Pattern/deviations: Step-through pattern;Decreased stride length     General Gait Details: gripping walker so O2 sat reading inaccurate, eager to continue to ended up walking full unit lap, pt on 3L O2 throughout with SpO2 100% immediately after sitting EOB   Stairs             Wheelchair Mobility    Modified Rankin (Stroke Patients Only)       Balance Overall balance assessment: Needs assistance   Sitting balance-Leahy Scale: Good     Standing balance support: Bilateral upper extremity supported Standing balance-Leahy Scale: Poor Standing balance comment: reliant on external support                            Cognition Arousal/Alertness: Awake/alert Behavior During Therapy: WFL for tasks assessed/performed Overall Cognitive Status: Within Functional Limits for tasks assessed                                        Exercises      General Comments        Pertinent Vitals/Pain Faces Pain Scale: Hurts little more Pain Location: surgical site Pain Descriptors / Indicators: Operative site guarding Pain Intervention(s): Monitored during session    Home Living  Prior Function            PT Goals (current goals can now be found in the care plan section) Progress towards PT goals: Progressing toward goals    Frequency    Min 5X/week      PT Plan Current plan remains appropriate    Co-evaluation              AM-PAC PT "6 Clicks" Mobility   Outcome Measure  Help needed turning from your back to your side while in a flat bed without using bedrails?: A Little Help needed moving from lying on your back to sitting on the side of a flat bed without using bedrails?: A Little Help needed moving to and from a  bed to a chair (including a wheelchair)?: A Little Help needed standing up from a chair using your arms (e.g., wheelchair or bedside chair)?: A Little Help needed to walk in hospital room?: A Little Help needed climbing 3-5 steps with a railing? : A Little 6 Click Score: 18    End of Session Equipment Utilized During Treatment: Oxygen Activity Tolerance: Patient tolerated treatment well Patient left: in bed;with call bell/phone within reach;with bed alarm set;with nursing/sitter in room   PT Visit Diagnosis: Unsteadiness on feet (R26.81);Muscle weakness (generalized) (M62.81)     Time: 1610-9604 PT Time Calculation (min) (ACUTE ONLY): 28 min  Charges:  $Gait Training: 8-22 mins $Therapeutic Activity: 8-22 mins                     Magda Kiel, PT Acute Rehabilitation Services [id number]:[phone] Office:[phone] 01/11/2021    Reginia Naas 01/11/2021, 12:46 PM

## 2021-01-12 ENCOUNTER — Inpatient Hospital Stay (HOSPITAL_COMMUNITY): Payer: 59

## 2021-01-12 DIAGNOSIS — Z9689 Presence of other specified functional implants: Secondary | ICD-10-CM

## 2021-01-12 DIAGNOSIS — Z9889 Other specified postprocedural states: Secondary | ICD-10-CM | POA: Diagnosis not present

## 2021-01-12 DIAGNOSIS — J90 Pleural effusion, not elsewhere classified: Secondary | ICD-10-CM | POA: Diagnosis not present

## 2021-01-12 DIAGNOSIS — E1140 Type 2 diabetes mellitus with diabetic neuropathy, unspecified: Secondary | ICD-10-CM | POA: Diagnosis not present

## 2021-01-12 LAB — GLUCOSE, CAPILLARY
Glucose-Capillary: 171 mg/dL — ABNORMAL HIGH (ref 70–99)
Glucose-Capillary: 189 mg/dL — ABNORMAL HIGH (ref 70–99)
Glucose-Capillary: 192 mg/dL — ABNORMAL HIGH (ref 70–99)
Glucose-Capillary: 281 mg/dL — ABNORMAL HIGH (ref 70–99)
Glucose-Capillary: 71 mg/dL (ref 70–99)
Glucose-Capillary: 99 mg/dL (ref 70–99)

## 2021-01-12 LAB — CBC
HCT: 29.5 % — ABNORMAL LOW (ref 39.0–52.0)
Hemoglobin: 9.5 g/dL — ABNORMAL LOW (ref 13.0–17.0)
MCH: 28.4 pg (ref 26.0–34.0)
MCHC: 32.2 g/dL (ref 30.0–36.0)
MCV: 88.1 fL (ref 80.0–100.0)
Platelets: 643 10*3/uL — ABNORMAL HIGH (ref 150–400)
RBC: 3.35 MIL/uL — ABNORMAL LOW (ref 4.22–5.81)
RDW: 14.6 % (ref 11.5–15.5)
WBC: 10.4 10*3/uL (ref 4.0–10.5)
nRBC: 0 % (ref 0.0–0.2)

## 2021-01-12 LAB — COMPREHENSIVE METABOLIC PANEL WITH GFR
ALT: 10 U/L (ref 0–44)
AST: 12 U/L — ABNORMAL LOW (ref 15–41)
Albumin: 1.5 g/dL — ABNORMAL LOW (ref 3.5–5.0)
Alkaline Phosphatase: 71 U/L (ref 38–126)
Anion gap: 8 (ref 5–15)
BUN: 7 mg/dL (ref 6–20)
CO2: 30 mmol/L (ref 22–32)
Calcium: 8 mg/dL — ABNORMAL LOW (ref 8.9–10.3)
Chloride: 97 mmol/L — ABNORMAL LOW (ref 98–111)
Creatinine, Ser: 0.67 mg/dL (ref 0.61–1.24)
GFR, Estimated: >60 mL/min (ref >60)
Glucose, Bld: 130 mg/dL — ABNORMAL HIGH (ref 70–99)
Potassium: 3.8 mmol/L (ref 3.5–5.1)
Sodium: 135 mmol/L (ref 135–145)
Total Bilirubin: 0.4 mg/dL (ref 0.3–1.2)
Total Protein: 5.7 g/dL — ABNORMAL LOW (ref 6.5–8.1)

## 2021-01-12 MED ORDER — EMPAGLIFLOZIN 25 MG PO TABS
25.0000 mg | ORAL_TABLET | Freq: Every day | ORAL | Status: DC
  Filled 2021-01-12: qty 1

## 2021-01-12 MED ORDER — HYDROMORPHONE HCL 1 MG/ML IJ SOLN
0.5000 mg | INTRAMUSCULAR | Status: DC | PRN
  Administered 2021-01-12 – 2021-01-15 (×13): 0.5 mg via INTRAVENOUS
  Filled 2021-01-12 (×13): qty 1

## 2021-01-12 MED ORDER — POLYETHYLENE GLYCOL 3350 17 G PO PACK
17.0000 g | PACK | Freq: Every day | ORAL | Status: DC
  Administered 2021-01-12 – 2021-01-16 (×5): 17 g via ORAL
  Filled 2021-01-12 (×6): qty 1

## 2021-01-12 MED ORDER — INSULIN ASPART 100 UNIT/ML IJ SOLN
0.0000 [IU] | INTRAMUSCULAR | Status: DC
  Administered 2021-01-12: 12 [IU] via SUBCUTANEOUS
  Administered 2021-01-12: 4 [IU] via SUBCUTANEOUS

## 2021-01-12 MED ORDER — METFORMIN HCL 500 MG PO TABS: 1000.0000 mg | ORAL_TABLET | Freq: Two times a day (BID) | ORAL | Status: DC

## 2021-01-12 MED ORDER — ENOXAPARIN SODIUM 40 MG/0.4ML IJ SOSY
40.0000 mg | PREFILLED_SYRINGE | Freq: Every day | INTRAMUSCULAR | Status: DC
  Administered 2021-01-12 – 2021-01-17 (×6): 40 mg via SUBCUTANEOUS
  Filled 2021-01-12 (×6): qty 0.4

## 2021-01-12 MED ORDER — OXYCODONE HCL 5 MG PO TABS
10.0000 mg | ORAL_TABLET | Freq: Four times a day (QID) | ORAL | Status: DC
Start: 2021-01-12 — End: 2021-01-17
  Administered 2021-01-12 – 2021-01-17 (×21): 10 mg via ORAL
  Filled 2021-01-12 (×21): qty 2

## 2021-01-12 MED ORDER — INSULIN ASPART 100 UNIT/ML IJ SOLN
0.0000 [IU] | Freq: Three times a day (TID) | INTRAMUSCULAR | Status: DC
  Administered 2021-01-13: 16 [IU] via SUBCUTANEOUS

## 2021-01-12 NOTE — Progress Notes (Addendum)
Family Medicine Teaching Service Daily Progress Note Intern Pager: [phone]  Patient name: Christopher Fernandez Medical record number: [id number] Date of birth: [birth year] Age: 50 y.o. Gender: male  Primary Care Provider: Danna Hefty, DO Consultants: Neurosurgery, CT surgery, ID Code Status: FULL  Pt Overview and Major Events to Date:  4/15: Admitted  4/22: T8-11 lateral fusion, T9-10 corpectomy, 1u pRBC 4/23: 2u pRBC 4/24: 1u pRBC 4/29: right thoracotomy with drainage of pleural effusion  Assessment and Plan: Christopher Fernandez a 50 y.o.malewith worsening osteomyelitis and concern of new abscess/phlegmon in thoracic spine now s/p decompression of the thoracic spine with corpectomy reconstruction. PMH is significant forhistory of thoracic osteomyelitis, T2DM, peripheral neuropathy, chronic pancreatitis, HLD, HTN, asthma, seasonal allergies, GERD.  Osteomyelitis/discitiswithnew abscess/phlegmoninthoracic spine s/p T8-11 lateral fusion and T9-10 corpectomy on 4/22 Patient states that pain has worsened since being moved out of the ICU. Has been able to move out of bed but the pain is worsened with movement and coughing. Fentanyl PCA was stopped yesterday. Patient requests Dilaudid PRN for breakthrough pain. Patient has PICC to right arm that was placed 3/27.  -ID, Neurosurgery and CTS following: appreciate recommendations -IV Daptomycin 1000 mg (4/15-)  Weekly CK, first lab draw scheduled for 5/2 -IV Rifampin 300 mg BID (4/15-) -Doxycycline 100 mg q12h (4/27-)  Duration to be determined by ID physician on 5/2 -Acetaminophen 1000 mg q6h  -D/c Meloxicam -Ketorolac 15 mg q6h PRN  -Oxycodone 10 mg q6h moderate pain -Dilaudid 0.5 mg q4h PRN breakthrough pain -Methocarbamol 500 mg q6h PRN muscle spasms -Lidocaine patch qd -D/c Compazine and Zofran as nausea resolved -GI ppx: Senokot-S daily, MiraLAX daily  -PT/OT recommendations: Home health PT. Equipment: rolling walker with 5"  wheels and 3 in 1 bedside commode  Right Loculated Pleural Effusions s/p redo right thoracotomy 4/29 Chest tube still in place, with 550 cc of drainage. On 2L of O2. Breathing comfortably though still with wheezing in b/l lower lobes. -Wean O2 as tolerated.   Osteomyelitis of right hip/pubis -ID following; appreciate recommendations -Continue IV Daptomycin; weekly CK  Type 2 DM with peripheral neuropathy -sSSI  -Monitor CBG's AC/qHS -Continue Gabapentin 600 mg TID  HTN: chronic, stable BP ranging 119-142/69-93. Home meds include amlodipine 2.5 mg daily. -Holding amlodipine  Chronic pancreatitis with pancreatic insufficiency On creon TID with meals. -Continue Creon TID  Asthma and history of seasonal allergies -Albuterol PRN wheezing, SOB -Breo ellipta 1 puff daily  -Claritin 10 mg daily  -Singulair 10 mg daily   History of alcohol abuse/tobacco use disorder Patient states his last drink was about 1.5 years ago. Continues to smoke about half pack per day, previously smoked about a pack per day for 35 years. -nicotine patch as desired  Anxiety Depression: chronic, stable -Abilify 5 mg  -Duloxetine 60 mg daily  -Mirtazapine 30 mg daily   GERD: chronic, stable -continue protonix   Hyperlipidemia: chronic, stable -Continue Atorvastatin 40 mg daily   History of tremors -holding home propranolol  Left foot recent toe amputation 3/23 Also previously had right transmetatarsal amputation about a year ago. -monitor clinically  -PT/OT   Protein malnutrition Albumin 1.5, total protein 5.7.  -Continue Prosource feeding supplements TID between meals -Multivitamin with minerals daily   FEN/GI: Carb modified diet; D5 LR; Miralax, Senokot-s PPx: Lovenox    Status is: Inpatient  Remains inpatient appropriate because:IV treatments appropriate due to intensity of illness or inability to take PO and Inpatient level of care appropriate due to severity of  illness   Dispo:  Patient From: Home  Planned Disposition: Home with Health Care Svc  Medically stable for discharge: No     Subjective:  Christopher Fernandez feels that his pain has slightly worsened since moving out of the ICU. States that pain is worse with movement but especially worse when he coughs. He feels the pain in his upper and lower back. Requests Dilaudid for breakthrough pain. He is not feeling constipated and states his last bowel movement was the day before his surgery (Thursday 4/28). He feels that when he gets up and moves around he will be more regular. He feels that his breathing is improving and that that the oxygen is helping. He is thankful that he underwent surgery and feels that he will make a good recovery. He is hopeful to be discharged in the next day or two.   Objective: Temp:  [97.9 F (36.6 C)-98.9 F (37.2 C)] 98.2 F (36.8 C) (05/01 0748) Pulse Rate:  [75-103] 94 (05/01 0748) Resp:  [13-20] 18 (05/01 0748) BP: (119-142)/(69-103) 127/79 (05/01 0748) SpO2:  [90 %-100 %] 95 % (05/01 0748) Arterial Line BP: (119-152)/(67-82) 152/80 (04/30 1130) Physical Exam: General: Awake, alert, pleasant, in no distress Cardiovascular: RRR without murmur Respiratory: Wheezing in b/l lower lung fields, right-sided chest tube in place with 550 cc blood tinged fluid drained  Abdomen: soft, non-tender in all quadrants, non-distended, no rebound/guarding Extremities: without swelling, SCD's in place Skin: warm and dry, right PICC line in place C/D/I. Lidocaine patch to right side of back, large well-healed midline abdominal surgical scar, right-sided back laceration with sutures in place  Laboratory: Recent Labs  Lab 01/09/21 0450 01/10/21 0443 01/11/21 0313 01/11/21 0316  WBC 9.5 9.8 11.6*  --   HGB 9.7* 9.5* 9.7* 10.2*  HCT 30.2* 29.6* 29.8* 30.0*  PLT 441* 474* 568*  --    Recent Labs  Lab 01/10/21 1300 01/11/21 0313 01/11/21 0316 01/12/21 0610  NA 134* 132* 136 135   K 4.2 3.4* 3.5 3.8  CL 96* 94*  --  97*  CO2 26 29  --  30  BUN 8 5*  --  7  CREATININE 0.75 0.66  --  0.67  CALCIUM 7.8* 7.9*  --  8.0*  PROT  --   --   --  5.7*  BILITOT  --   --   --  0.4  ALKPHOS  --   --   --  71  ALT  --   --   --  10  AST  --   --   --  12*  GLUCOSE 184* 172*  --  130*   Imaging/Diagnostic Tests: Chest X-ray report pending  Sharion Settler, DO 01/12/2021, 7:53 AM PGY-1, [facility] Intern pager: [phone], text pages welcome

## 2021-01-12 NOTE — Progress Notes (Addendum)
[address]       [place] [address]             [phone]      2 Days Post-Op Procedure(s) (LRB): REDO THORACOTOMY (Right) DRAINAGE OF PLEURAL EFFUSION (Right) Subjective: Feels pretty well, not SOB at rest  Objective: Vital signs in last 24 hours: Temp:  [97.9 F (36.6 C)-98.9 F (37.2 C)] 98.2 F (36.8 C) (05/01 0748) Pulse Rate:  [75-103] 94 (05/01 0748) Cardiac Rhythm: Normal sinus rhythm (05/01 0700) Resp:  [13-20] 18 (05/01 0748) BP: (119-142)/(69-93) 127/79 (05/01 0748) SpO2:  [90 %-100 %] 95 % (05/01 0748) Arterial Line BP: (119-152)/(67-80) 152/80 (04/30 1130)  Hemodynamic parameters for last 24 hours:    Intake/Output from previous day: 04/30 0701 - 05/01 0700 In: 1285.9 [P.O.:720; I.V.:565.9] Out: 4854 [OEVOJ:5009; Chest Tube:200] Intake/Output this shift: No intake/output data recorded.  General appearance: alert, cooperative and no distress Heart: regular rate and rhythm Lungs: air exchange on right somewhat diminished Abdomen: benign Extremities: no edema or calf tenderness Wound: incis healing well, some swelling  Lab Results: Recent Labs    01/10/21 0443 01/11/21 0313 01/11/21 0316  WBC 9.8 11.6*  --   HGB 9.5* 9.7* 10.2*  HCT 29.6* 29.8* 30.0*  PLT 474* 568*  --    BMET:  Recent Labs    01/11/21 0313 01/11/21 0316 01/12/21 0610  NA 132* 136 135  K 3.4* 3.5 3.8  CL 94*  --  97*  CO2 29  --  30  GLUCOSE 172*  --  130*  BUN 5*  --  7  CREATININE 0.66  --  0.67  CALCIUM 7.9*  --  8.0*    PT/INR: No results for input(s): LABPROT, INR in the last 72 hours. ABG    Component Value Date/Time   PHART 7.484 (H) 01/11/2021 0316   HCO3 31.7 (H) 01/11/2021 0316   TCO2 33 (H) 01/11/2021 0316   O2SAT 96.0 01/11/2021 0316   CBG (last 3)  Recent Labs    01/12/21 0010 01/12/21 0451 01/12/21 0746  GLUCAP 171* 99 192*    Meds Scheduled Meds: . (feeding supplement) PROSource Plus  30 mL Oral TID BM  .  acetaminophen  1,000 mg Oral Q6H  . ARIPiprazole  5 mg Oral QPM  . atorvastatin  40 mg Oral Daily  . bisacodyl  10 mg Oral Daily  . Chlorhexidine Gluconate Cloth  6 each Topical Q0600  . docusate sodium  100 mg Oral BID  . doxycycline  100 mg Oral Q12H  . DULoxetine  60 mg Oral Daily  . fluticasone furoate-vilanterol  1 puff Inhalation Daily  . gabapentin  600 mg Oral TID  . insulin aspart  0-24 Units Subcutaneous Q4H  . lidocaine  1 patch Transdermal Q24H  . lipase/protease/amylase  36,000 Units Oral TID WC  . loratadine  10 mg Oral Daily  . mouth rinse  15 mL Mouth Rinse BID  . [START ON 01/13/2021] meloxicam  15 mg Oral Daily  . mirtazapine  30 mg Oral QHS  . montelukast  10 mg Oral QHS  . multivitamin with minerals  1 tablet Oral Daily  . pantoprazole  40 mg Oral BID  . rifampin  300 mg Oral BID  . senna-docusate  1 tablet Oral QHS   Continuous Infusions: . sodium chloride    . DAPTOmycin (CUBICIN)  IV 1,000 mg (01/11/21 2338)  . dextrose 5% lactated ringers 75 mL/hr at  01/11/21 1500  . methocarbamol (ROBAXIN) IV Stopped (01/11/21 0204)   PRN Meds:.Place/Maintain arterial line **AND** sodium chloride, albuterol, alum & mag hydroxide-simeth, bismuth subsalicylate, ketorolac, menthol-cetylpyridinium **OR** [DISCONTINUED] phenol, methocarbamol **OR** methocarbamol (ROBAXIN) IV, ondansetron (ZOFRAN) IV, oxyCODONE, polyethylene glycol, prochlorperazine, senna  Xrays DG CHEST PORT 1 VIEW  Result Date: 01/11/2021 CLINICAL DATA:  Status post thoracotomy EXAM: PORTABLE CHEST 1 VIEW COMPARISON:  January 10, 2021 FINDINGS: Thoracic spine hardware remains. The cardiomediastinal silhouette is stable. A right PICC line terminates in the central SVC. No pneumothorax identified. There is a small right pleural effusion with underlying opacity, more pronounced in the interval. The left lung remains clear. IMPRESSION: 1. Support apparatus as above. 2. There is a small right pleural effusion with  associated opacity more pronounced in the interval. The associated opacity may represent worsening atelectasis or developing infiltrate. Recommend attention on follow-up. 3. No other change. Electronically Signed   By: Dorise Bullion III M.D   On: 01/11/2021 09:07   DG Chest Port 1 View  Result Date: 01/10/2021 CLINICAL DATA:  Right pleural effusion.  Chest tube placement. EXAM: PORTABLE CHEST 1 VIEW COMPARISON:  01/09/2021 FINDINGS: A new chest tube is seen in the right lower hemithorax, with decreased size of small right pleural effusion since prior study. No pneumothorax visualized. Decreased atelectasis also seen in the right lung base. Right arm PICC line remains in appropriate position. Left lung remains clear. Heart size is stable. IMPRESSION: Decreased right pleural effusion and right basilar atelectasis following chest tube placement. No pneumothorax visualized. Electronically Signed   By: Marlaine Hind M.D.   On: 01/10/2021 10:53    Assessment/Plan: S/P Procedure(s) (LRB): REDO THORACOTOMY (Right) DRAINAGE OF PLEURAL EFFUSION (Right)  1 afeb, VSS, SBP 110's -140's 2 sats ok on 3 liters [place], no new CXR- will order one . CT drainage 200 cc serosang, no air leak. Leave for now both tubes 3 excellent UOP. Weight not recorded, normal renal fxn 4 fair CBG control- per medicine 6 cont ABX per ID 7 medical issue management as per primary team    LOS: 15 days    John Giovanni PA-C Pager [phone] 01/12/2021   Agree with above. We will keep chest tubes for now.  Becket Wecker Bary Leriche

## 2021-01-12 NOTE — Progress Notes (Signed)
NEUROSURGERY PROGRESS NOTE  Status post thoracic decompression and fusion with thoracotomy. No acute events over night. Patient sitting up in bed and seems comfortable. Doing well. Complains of appropriate back soreness. transferred to the floor from [employer] just yesterday. Has been ambulating well. Denies any symptoms in his legs. Continue supportive care and therapies today.   Temp:  [97.9 F (36.6 C)-98.9 F (37.2 C)] 98.3 F (36.8 C) (05/01 0550) Pulse Rate:  [75-103] 94 (05/01 0550) Resp:  [13-27] 20 (05/01 0000) BP: (119-142)/(69-103) 119/75 (05/01 0550) SpO2:  [90 %-100 %] 94 % (05/01 0550) Arterial Line BP: (119-152)/(67-82) 152/80 (04/30 1130)   Eleonore Chiquito, NP 01/12/2021 6:50 AM

## 2021-01-13 ENCOUNTER — Inpatient Hospital Stay (HOSPITAL_COMMUNITY): Payer: 59

## 2021-01-13 DIAGNOSIS — R21 Rash and other nonspecific skin eruption: Secondary | ICD-10-CM

## 2021-01-13 DIAGNOSIS — Z9689 Presence of other specified functional implants: Secondary | ICD-10-CM | POA: Diagnosis not present

## 2021-01-13 DIAGNOSIS — J90 Pleural effusion, not elsewhere classified: Secondary | ICD-10-CM | POA: Diagnosis not present

## 2021-01-13 DIAGNOSIS — E1140 Type 2 diabetes mellitus with diabetic neuropathy, unspecified: Secondary | ICD-10-CM | POA: Diagnosis not present

## 2021-01-13 DIAGNOSIS — Z9889 Other specified postprocedural states: Secondary | ICD-10-CM | POA: Diagnosis not present

## 2021-01-13 LAB — BASIC METABOLIC PANEL
Anion gap: 7 (ref 5–15)
BUN: 12 mg/dL (ref 6–20)
CO2: 28 mmol/L (ref 22–32)
Calcium: 8 mg/dL — ABNORMAL LOW (ref 8.9–10.3)
Chloride: 100 mmol/L (ref 98–111)
Creatinine, Ser: 0.77 mg/dL (ref 0.61–1.24)
GFR, Estimated: >60 mL/min (ref >60)
Glucose, Bld: 214 mg/dL — ABNORMAL HIGH (ref 70–99)
Potassium: 4.2 mmol/L (ref 3.5–5.1)
Sodium: 135 mmol/L (ref 135–145)

## 2021-01-13 LAB — GLUCOSE, CAPILLARY
Glucose-Capillary: 323 mg/dL — ABNORMAL HIGH (ref 70–99)
Glucose-Capillary: 111 mg/dL — ABNORMAL HIGH (ref 70–99)
Glucose-Capillary: 229 mg/dL — ABNORMAL HIGH (ref 70–99)
Glucose-Capillary: 293 mg/dL — ABNORMAL HIGH (ref 70–99)

## 2021-01-13 LAB — CBC WITH DIFFERENTIAL/PLATELET
Abs Immature Granulocytes: 0.19 10*3/uL — ABNORMAL HIGH (ref 0.00–0.07)
Basophils Absolute: 0 10*3/uL (ref 0.0–0.1)
Basophils Relative: 0 %
Eosinophils Absolute: 0.3 10*3/uL (ref 0.0–0.5)
Eosinophils Relative: 3 %
HCT: 28.3 % — ABNORMAL LOW (ref 39.0–52.0)
Hemoglobin: 9.1 g/dL — ABNORMAL LOW (ref 13.0–17.0)
Immature Granulocytes: 2 %
Lymphocytes Relative: 13 %
Lymphs Abs: 1.5 10*3/uL (ref 0.7–4.0)
MCH: 28.3 pg (ref 26.0–34.0)
MCHC: 32.2 g/dL (ref 30.0–36.0)
MCV: 88.2 fL (ref 80.0–100.0)
Monocytes Absolute: 0.8 10*3/uL (ref 0.1–1.0)
Monocytes Relative: 7 %
Neutro Abs: 8.6 10*3/uL — ABNORMAL HIGH (ref 1.7–7.7)
Neutrophils Relative %: 75 %
Platelets: 677 10*3/uL — ABNORMAL HIGH (ref 150–400)
RBC: 3.21 MIL/uL — ABNORMAL LOW (ref 4.22–5.81)
RDW: 14.4 % (ref 11.5–15.5)
WBC: 11.4 10*3/uL — ABNORMAL HIGH (ref 4.0–10.5)
nRBC: 0 % (ref 0.0–0.2)

## 2021-01-13 LAB — CULTURE, BLOOD (ROUTINE X 2)
Culture: NO GROWTH
Culture: NO GROWTH
Special Requests: ADEQUATE
Special Requests: ADEQUATE

## 2021-01-13 LAB — CK: Total CK: 52 U/L (ref 49–397)

## 2021-01-13 MED ORDER — IBUPROFEN 400 MG PO TABS
400.0000 mg | ORAL_TABLET | Freq: Three times a day (TID) | ORAL | Status: DC
  Administered 2021-01-13 – 2021-01-15 (×6): 400 mg via ORAL
  Filled 2021-01-13 (×6): qty 1

## 2021-01-13 MED ORDER — INSULIN GLARGINE 100 UNIT/ML ~~LOC~~ SOLN
10.0000 [IU] | Freq: Every day | SUBCUTANEOUS | Status: DC
  Administered 2021-01-13: 5 [IU] via SUBCUTANEOUS
  Administered 2021-01-14: 10 [IU] via SUBCUTANEOUS
  Filled 2021-01-13 (×3): qty 0.1

## 2021-01-13 MED ORDER — VANCOMYCIN HCL 1500 MG/300ML IV SOLN
1500.0000 mg | Freq: Two times a day (BID) | INTRAVENOUS | Status: DC
  Administered 2021-01-13 – 2021-01-17 (×8): 1500 mg via INTRAVENOUS
  Filled 2021-01-13 (×9): qty 300

## 2021-01-13 MED ORDER — INSULIN ASPART 100 UNIT/ML IJ SOLN
0.0000 [IU] | Freq: Three times a day (TID) | INTRAMUSCULAR | Status: DC
  Administered 2021-01-13: 5 [IU] via SUBCUTANEOUS
  Administered 2021-01-14: 8 [IU] via SUBCUTANEOUS
  Administered 2021-01-14: 3 [IU] via SUBCUTANEOUS
  Administered 2021-01-14: 5 [IU] via SUBCUTANEOUS
  Administered 2021-01-15 (×3): 3 [IU] via SUBCUTANEOUS
  Administered 2021-01-16: 2 [IU] via SUBCUTANEOUS
  Administered 2021-01-16: 5 [IU] via SUBCUTANEOUS
  Administered 2021-01-16 – 2021-01-17 (×3): 3 [IU] via SUBCUTANEOUS

## 2021-01-13 MED ORDER — VANCOMYCIN HCL 2000 MG/400ML IV SOLN
2000.0000 mg | Freq: Once | INTRAVENOUS | Status: AC
  Administered 2021-01-13: 2000 mg via INTRAVENOUS
  Filled 2021-01-13: qty 400

## 2021-01-13 NOTE — Progress Notes (Signed)
  NEUROSURGERY PROGRESS NOTE   No issues overnight.  Complains of appropriate back soreness No new N/T/W in extremities  EXAM:  BP (!) 131/92 (BP Location: Left Arm)   Pulse 84   Temp 98.1 F (36.7 C) (Oral)   Resp 18   Ht 6\' 3"  (1.905 m)   Wt 109.1 kg   SpO2 94%   BMI 30.06 kg/m   Awake, alert, oriented  Speech fluent, appropriate  CN grossly intact  Nonfocal strength BLE  PLAN Doing well from NS perspective No new reccomendations Continue abx, supportive care per primary team, CT management per CT surgery

## 2021-01-13 NOTE — Progress Notes (Signed)
PHARMACY CONSULT NOTE FOR:  OUTPATIENT  PARENTERAL ANTIBIOTIC THERAPY (OPAT)  Indication: Disseminated MRSA infection  Regimen: Vancomycin 1500mg  IV q12h  End date: 02/25/2021  IV antibiotic discharge orders are pended. To discharging provider:  please sign these orders via discharge navigator,  Select New Orders & click on the button choice - Manage This Unsigned Work.     Thank you for allowing pharmacy to be a part of this patient's care.  Phillis Haggis 01/13/2021, 1:26 PM

## 2021-01-13 NOTE — Progress Notes (Addendum)
[facility] Infectious Diseases Follow Up Note  Patient Identification: Patient Name: Christopher Fernandez MRN: [id number] Admit Date: 12/27/2020  6:12 AM Age: 50 y.o.Today's Date: 01/13/2021   Reason for Visit: Follow-up on disseminated MRSA infection  Active Problems:   Type 2 diabetes mellitus with diabetic neuropathy, with long-term current use of insulin (HCC)   Symptomatic anemia   MRSA infection   Vertebral osteomyelitis ([place])   Osteomyelitis of second toe of left foot (HCC)   Epidural abscess   S/P thoracotomy   Post-operative hemoglobin drop   Constipation   Previous back surgery   Shortness of breath   Pleural effusion   Chest tube in place  Antibiotics:Daptomycin 4/15-current                    Rifampin 4/25- current                    Doxycycline 4/28- current  Total days of antibiotics 13  Lines/Tubes:PICC right arm 3/27, 2 chest tubes in rt side.   Interval Events: Remains afebrile, mild leukocytosis of 11.4 but otherwise stable. Chest xray with small rt pleural effusion. On RA   Assessment Loculated right-sided pleural effusion status post redo right thoracotomy and drainage of loculated right pleural effusion on 4/29.   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 DC doxycycline. Continue rifampin  - Will reset the clock for Vancomycin and Rifampin from today and plan  for 6 to 8 weeks of therapy ( Preferably 8 weeks) thereafter given Daptomycin was non susceptible to MRSA.  -Will defer need of repeat MRI thoracic spine near completion of IV antibiotics during OP follow up with ID given T9-T10 epidural abscess  - Chest tube management per CT Sx - Monitor CBC , CMP and Vancomycin trough  - ESR and CRP at least once 2 weekly - Patient already has an appt with Dr Tommy Medal on 5/12 at 9:45 am - OPAT orders in  Rest of the management as per the primary team. Thank you for the consult. Please page with pertinent questions or concerns.  ______________________________________________________________________ Subjective patient seen and examined at the bedside. Lying in bed, had sob and cough over the weekend which has been improving now. Denies nausea, vomiting, abdominal pain and diarrhea.    Vitals BP (!) 131/92 (BP Location: Left Arm)   Pulse 84   Temp 98.1 F (36.7 C) (Oral)   Resp 18   Ht 6' 3" (1.905 m)   Wt 109.1 kg   SpO2 96%   BMI 30.06 kg/m     Physical Exam Constitutional:  Lying in bed, no acute distress     Comments:   Cardiovascular:     Rate and Rhythm: Normal rate and regular rhythm.     Heart sounds: RRR  Pulmonary:     Effort: Pulmonary effort is normal.     Comments: Chest tube in the rt side  Abdominal:     Palpations: Abdomen is soft.     Tenderness: Non tender   Musculoskeletal:  General: No swelling or tenderness.   Skin:    Comments: No obvious rashes or lesions   Neurological:     General: No focal deficit present.   Psychiatric:        Mood and Affect: Mood normal.     Pertinent Microbiology Results for orders placed or performed during the hospital encounter of 12/27/20  SARS CORONAVIRUS 2 (TAT 6-24 HRS) Nasopharyngeal Nasopharyngeal Swab     Status: None   Collection Time: 12/27/20  6:16 AM   Specimen: Nasopharyngeal Swab  Result Value Ref Range Status   SARS Coronavirus 2 NEGATIVE NEGATIVE  Final    Comment: (NOTE) SARS-CoV-2 target nucleic acids are NOT DETECTED.  The SARS-CoV-2 RNA is generally detectable in upper and lower respiratory specimens during the acute phase of infection. Negative results do not preclude SARS-CoV-2 infection, do not rule out co-infections with other pathogens, and should not be used as the sole basis for treatment or other patient management decisions. Negative results must be combined with clinical observations, patient history, and epidemiological information. The expected result is Negative.  Fact Sheet for Patients: SugarRoll.be  Fact Sheet for Healthcare Providers: https://www.woods-mathews.com/  This test is not yet approved or cleared by the [place] FDA and  has been authorized for detection and/or diagnosis of SARS-CoV-2 by FDA under an Emergency Use Authorization (EUA). This EUA will remain  in effect (meaning this test can be used) for the duration of the COVID-19 declaration under Se ction 564(b)(1) of the Act, 21 U.S.C. section 360bbb-3(b)(1), unless the authorization is terminated or revoked sooner.  Performed at [facility], [place] [address], [place], [place] [address]   Blood culture (routine single)     Status: None   Collection Time: 12/27/20  6:29 AM   Specimen: BLOOD LEFT HAND  Result Value Ref Range Status   Specimen Description BLOOD LEFT HAND  Final   Special Requests   Final    BOTTLES DRAWN AEROBIC AND ANAEROBIC Blood Culture results may not be optimal due to an inadequate volume of blood received in culture bottles   Culture   Final    NO GROWTH 5 DAYS Performed at [facility], [place] [address], [place], [place] [address]    Report Status 01/01/2021 FINAL  Final  Urine culture     Status: Abnormal   Collection Time: 12/27/20  9:26 AM   Specimen: In/Out Cath Urine  Result Value Ref Range Status   Specimen Description IN/OUT CATH URINE  Final    Special Requests NONE  Final   Culture (A)  Final    1,000 COLONIES/mL DIPHTHEROIDS(CORYNEBACTERIUM SPECIES) Standardized susceptibility testing for this organism is not available. Performed at [facility], [place] [address], [place], [place] [address]    Report Status 12/28/2020 FINAL  Final  Surgical pcr screen     Status: None   Collection Time: 12/30/20 10:49 PM   Specimen: Nasal Mucosa; Nasal Swab  Result Value Ref Range Status   MRSA, PCR NEGATIVE NEGATIVE Final   Staphylococcus aureus NEGATIVE NEGATIVE Final    Comment: (NOTE) The Xpert SA Assay (FDA approved for NASAL specimens in patients 43 years of age and older), is one component of a comprehensive surveillance program. It is not intended to diagnose infection nor to guide or monitor treatment. Performed at [facility], [place] [address] [place], [place] [address]   Aerobic/Anaerobic Culture w Gram Stain (surgical/deep wound)     Status: None (Preliminary  result)   Collection Time: 01/03/21  6:37 PM   Specimen: Wound  Result Value Ref Range Status   Specimen Description WOUND  Final   Special Requests 10 THORACIC 9  Final   Gram Stain   Final    FEW WBC PRESENT,BOTH PMN AND MONONUCLEAR NO ORGANISMS SEEN    Culture   Final    RARE METHICILLIN RESISTANT STAPHYLOCOCCUS AUREUS Sent to [facility] for further susceptibility testing. NO ANAEROBES ISOLATED Performed at [facility], [place] [address] [place], [place] [address]    Report Status PENDING  Incomplete   Organism ID, Bacteria METHICILLIN RESISTANT STAPHYLOCOCCUS AUREUS  Final      Susceptibility   Methicillin resistant staphylococcus aureus - MIC*    CIPROFLOXACIN <=0.5 SENSITIVE Sensitive     ERYTHROMYCIN >=8 RESISTANT Resistant     GENTAMICIN <=0.5 SENSITIVE Sensitive     OXACILLIN >=4 RESISTANT Resistant     TETRACYCLINE <=1 SENSITIVE Sensitive     VANCOMYCIN 1 SENSITIVE Sensitive     TRIMETH/SULFA <=10 SENSITIVE Sensitive     CLINDAMYCIN  <=0.25 SENSITIVE Sensitive     RIFAMPIN <=0.5 SENSITIVE Sensitive     Inducible Clindamycin NEGATIVE Sensitive     * RARE METHICILLIN RESISTANT STAPHYLOCOCCUS AUREUS  Culture, blood (routine x 2)     Status: None (Preliminary result)   Collection Time: 01/08/21  4:47 PM   Specimen: BLOOD  Result Value Ref Range Status   Specimen Description BLOOD LEFT ANTECUBITAL  Final   Special Requests   Final    BOTTLES DRAWN AEROBIC AND ANAEROBIC Blood Culture adequate volume   Culture   Final    NO GROWTH 4 DAYS Performed at [facility], [address], [place], [place] [address]    Report Status PENDING  Incomplete  Culture, blood (routine x 2)     Status: None (Preliminary result)   Collection Time: 01/08/21  4:48 PM   Specimen: BLOOD LEFT HAND  Result Value Ref Range Status   Specimen Description BLOOD LEFT HAND  Final   Special Requests   Final    BOTTLES DRAWN AEROBIC AND ANAEROBIC Blood Culture adequate volume   Culture   Final    NO GROWTH 4 DAYS Performed at [facility], [place] [address], [place], [place] [address]    Report Status PENDING  Incomplete      Pertinent Lab. CBC Latest Ref Rng & Units 01/13/2021 01/12/2021 01/11/2021  WBC 4.0 - 10.5 K/uL 11.4(H) 10.4 -  Hemoglobin 13.0 - 17.0 g/dL 9.1(L) 9.5(L) 10.2(L)  Hematocrit 39.0 - 52.0 % 28.3(L) 29.5(L) 30.0(L)  Platelets 150 - 400 K/uL 677(H) 643(H) -   CMP Latest Ref Rng & Units 01/13/2021 01/12/2021 01/11/2021  Glucose 70 - 99 mg/dL 214(H) 130(H) -  BUN 6 - 20 mg/dL 12 7 -  Creatinine 0.61 - 1.24 mg/dL 0.77 0.67 -  Sodium 135 - 145 mmol/L 135 135 136  Potassium 3.5 - 5.1 mmol/L 4.2 3.8 3.5  Chloride 98 - 111 mmol/L 100 97(L) -  CO2 22 - 32 mmol/L 28 30 -  Calcium 8.9 - 10.3 mg/dL 8.0(L) 8.0(L) -  Total Protein 6.5 - 8.1 g/dL - 5.7(L) -  Total Bilirubin 0.3 - 1.2 mg/dL - 0.4 -  Alkaline Phos 38 - 126 U/L - 71 -  AST 15 - 41 U/L - 12(L) -  ALT 0 - 44 U/L - 10 -     Pertinent Imaging today Plain films and CT  images have been personally visualized and interpreted; radiology reports have been reviewed. Decision making incorporated into the Impression / Recommendations.  Chest Xray 01/13/21 FINDINGS: RIGHT thoracostomy tubes unchanged.  RIGHT arm PICC line tip projects over SVC.  Prior thoracic spine stabilization T8-T11.  Enlargement of cardiac silhouette.  Mediastinal contours and pulmonary vascularity normal.  Small RIGHT pleural effusion and persistent bibasilar atelectasis RIGHT greater than LEFT.  No pneumothorax.  IMPRESSION: Persistent postoperative changes RIGHT lung base.  I have spent more than 35 minutes for this patient encounter including review of prior medical records, coordination of care  with greater than 50% of time being face to face/counseling and discussing diagnostics/treatment plan with the patient/family.  Electronically signed by:   Rosiland Oz, MD Infectious Disease Physician [facility] Pager: [phone]

## 2021-01-13 NOTE — Progress Notes (Addendum)
Physical Therapy Treatment Patient Details Name: Christopher Fernandez MRN: [id number] DOB: 11-Jan-1971 Today's Date: 01/13/2021    History of Present Illness Christopher Fernandez is a 50 y.o. male presenting with worsening osteomyelitis and concern of new abscess/phlegmon in thoracic spine after having a repeat MRI a few days ago. MRI evidence from 12/11/2010 worsening osteomyelitis of the parasymphyseal right superior pubic ramus with improvement in right abductor minimus myositis.s/p right-sided T7 thoracotomy with vertebrectomy of T9 and T10 for osteomyelitis decompression of epidural abscess reconstruction with Exe-Cort titanium implant and lateral plate fixation with traverse plate local autograft from T7 rib.  01/10/21 underwent redo R thoracotomy due to loculated R pleural effusion.  PMH is significant for history of thoracic osteomyelitis, type 2 diabetes, peripheral neuropathy, chronic pancreatitis/pancreatic insufficiency, hyperlipidemia, hypertension, asthma, seasonal allergies, GERD.    PT Comments    Pt progressing well with mobility. Able to amb on RA with good SpO2. Tried rollator but prefers rolling walker due to height.  Pt reported tingling in his heels bilaterally with amb.   Follow Up Recommendations  Home health PT     Equipment Recommendations  Rolling walker with 5" wheels;3in1 (PT)    Recommendations for Other Services  (as ordered)     Precautions / Restrictions Precautions Precautions: Back Precaution Booklet Issued: No Precaution Comments: R chest tube Required Braces or Orthoses: Other Brace Other Brace: Has a darco shoe from previous admission (approx 3 wks ago); Darco shoe is not in room, and pt was noncomital re: if MD officially cleared him to walk without it; Has been consistently walking without it so far this admission Restrictions Weight Bearing Restrictions: No    Mobility  Bed Mobility Overal bed mobility: Modified Independent Bed Mobility: Rolling;Sidelying to  Sit Rolling: Modified independent (Device/Increase time) Sidelying to sit: Modified independent (Device/Increase time);HOB elevated            Transfers Overall transfer level: Needs assistance Equipment used: Rolling walker (2 wheeled) Transfers: Sit to/from Stand Sit to Stand: Supervision         General transfer comment: assist for lines  Ambulation/Gait Ambulation/Gait assistance: Supervision Gait Distance (Feet): 450 Feet Assistive device: Rolling walker (2 wheeled) Gait Pattern/deviations: Step-through pattern;Decreased stride length Gait velocity: decr Gait velocity interpretation: <1.31 ft/sec, indicative of household ambulator General Gait Details: Supervision for lines and Barrister's clerk    Modified Rankin (Stroke Patients Only)       Balance Overall balance assessment: Needs assistance Sitting-balance support: No upper extremity supported;Feet supported Sitting balance-Leahy Scale: Good     Standing balance support: No upper extremity supported Standing balance-Leahy Scale: Fair Standing balance comment: Stood without support x 2 minutes                            Cognition Arousal/Alertness: Awake/alert Behavior During Therapy: WFL for tasks assessed/performed Overall Cognitive Status: Within Functional Limits for tasks assessed                                        Exercises Other Exercises Other Exercises: Repeated sit to stand from chair    General Comments General comments (skin integrity, edema, etc.): Pt on RA with SpO2 >91%.      Pertinent Vitals/Pain Pain Assessment: 0-10 Pain Score: 4  Pain Location: surgical site Pain Descriptors / Indicators: Operative site guarding Pain Intervention(s): Monitored during session    Home Living                      Prior Function            PT Goals (current goals can now be found in the care plan section)  Acute Rehab PT Goals Patient Stated Goal: get better Progress towards PT goals: Progressing toward goals    Frequency    Min 3X/week      PT Plan Current plan remains appropriate;Discharge plan needs to be updated    Co-evaluation              AM-PAC PT "6 Clicks" Mobility   Outcome Measure  Help needed turning from your back to your side while in a flat bed without using bedrails?: A Little Help needed moving from lying on your back to sitting on the side of a flat bed without using bedrails?: A Little Help needed moving to and from a bed to a chair (including a wheelchair)?: A Little Help needed standing up from a chair using your arms (e.g., wheelchair or bedside chair)?: A Little Help needed to walk in hospital room?: A Little Help needed climbing 3-5 steps with a railing? : A Little 6 Click Score: 18    End of Session   Activity Tolerance: Patient tolerated treatment well Patient left: with call bell/phone within reach;in chair Nurse Communication: Mobility status;Other (comment) (O2 sats with mobility) PT Visit Diagnosis: Unsteadiness on feet (R26.81);Muscle weakness (generalized) (M62.81)     Time: 1113-1140 PT Time Calculation (min) (ACUTE ONLY): 27 min  Charges:  $Gait Training: 23-37 mins                     [place] Pager [phone] Office [place] 01/13/2021, 12:52 PM

## 2021-01-13 NOTE — Progress Notes (Signed)
Occupational Therapy Treatment Patient Details Name: Christopher Fernandez MRN: [id number] DOB: 11-10-1970 Today's Date: 01/13/2021    History of present illness Christopher Fernandez is a 50 y.o. male presenting with worsening osteomyelitis and concern of new abscess/phlegmon in thoracic spine after having a repeat MRI a few days ago. MRI evidence from 12/11/2010 worsening osteomyelitis of the parasymphyseal right superior pubic ramus with improvement in right abductor minimus myositis.s/p right-sided T7 thoracotomy with vertebrectomy of T9 and T10 for osteomyelitis decompression of epidural abscess reconstruction with Exe-Cort titanium implant and lateral plate fixation with traverse plate local autograft from T7 rib.  01/10/21 underwent redo R thoracotomy due to loculated R pleural effusion.  PMH is significant for history of thoracic osteomyelitis, type 2 diabetes, peripheral neuropathy, chronic pancreatitis/pancreatic insufficiency, hyperlipidemia, hypertension, asthma, seasonal allergies, GERD.   OT comments  Pt making progress with functional goals. OT will continue to follow acutely to maximize level of function and safety  Follow Up Recommendations  Home health OT    Equipment Recommendations  3 in 1 bedside commode;Other (comment) (RW)    Recommendations for Other Services      Precautions / Restrictions Precautions Precautions: Other (comment) Precaution Booklet Issued: No Precaution Comments: R chest tube Required Braces or Orthoses: Other Brace Other Brace: Has a darco shoe from previous admission (approx 3 wks ago); Darco shoe is not in room, and pt was noncomital re: if MD officially cleared him to walk without it; Has been consistently walking without it so far this admission Restrictions Weight Bearing Restrictions: No LLE Weight Bearing: Weight bearing as tolerated       Mobility Bed Mobility Overal bed mobility: Modified Independent Bed Mobility: Rolling;Sidelying to Sit;Sit to  Sidelying Rolling: Modified independent (Device/Increase time) Sidelying to sit: Modified independent (Device/Increase time);HOB elevated     Sit to sidelying: Modified independent (Device/Increase time)      Transfers Overall transfer level: Needs assistance Equipment used: Rolling walker (2 wheeled) Transfers: Sit to/from Stand Sit to Stand: Supervision         General transfer comment: assist for lines    Balance Overall balance assessment: Needs assistance Sitting-balance support: No upper extremity supported;Feet supported Sitting balance-Leahy Scale: Good     Standing balance support: No upper extremity supported;During functional activity Standing balance-Leahy Scale: Fair Standing balance comment: Stood without support x 2 minutes                           ADL either performed or assessed with clinical judgement   ADL Overall ADL's : Needs assistance/impaired     Grooming: Wash/dry hands;Wash/dry face;Supervision/safety;Standing       Lower Body Bathing: Min guard;Sit to/from stand Lower Body Bathing Details (indicate cue type and reason): simulated     Lower Body Dressing: Min guard;Sit to/from stand   Toilet Transfer: Supervision/safety;Ambulation;RW   Toileting- Clothing Manipulation and Hygiene: Supervision/safety;Sit to/from stand       Functional mobility during ADLs: Supervision/safety;Rolling walker       Vision Baseline Vision/History: Wears glasses Patient Visual Report: No change from baseline     Perception     Praxis      Cognition Arousal/Alertness: Awake/alert Behavior During Therapy: WFL for tasks assessed/performed Overall Cognitive Status: Within Functional Limits for tasks assessed  Exercises Exercises: Other exercises Other Exercises Other Exercises: Repeated sit to stand from chair   Shoulder Instructions       General Comments Pt on RA with  SpO2 >91%.    Pertinent Vitals/ Pain       Pain Assessment: Faces Pain Score: 4  Faces Pain Scale: Hurts little more Pain Location: surgical site Pain Descriptors / Indicators: Operative site guarding Pain Intervention(s): Monitored during session;Repositioned  Home Living                                          Prior Functioning/Environment              Frequency  Min 2X/week        Progress Toward Goals  OT Goals(current goals can now be found in the care plan section)  Progress towards OT goals: Progressing toward goals  Acute Rehab OT Goals Patient Stated Goal: get better  Plan Discharge plan remains appropriate    Co-evaluation                 AM-PAC OT "6 Clicks" Daily Activity     Outcome Measure   Help from another person eating meals?: None Help from another person taking care of personal grooming?: A Little Help from another person toileting, which includes using toliet, bedpan, or urinal?: A Little Help from another person bathing (including washing, rinsing, drying)?: A Little Help from another person to put on and taking off regular upper body clothing?: None Help from another person to put on and taking off regular lower body clothing?: A Little 6 Click Score: 20    End of Session Equipment Utilized During Treatment: Rolling walker;Gait belt  OT Visit Diagnosis: Other abnormalities of gait and mobility (R26.89);Pain Pain - Right/Left: Left   Activity Tolerance Patient tolerated treatment well   Patient Left in bed;with call bell/phone within reach;with bed alarm set   Nurse Communication          Time: 3016-0109 OT Time Calculation (min): 19 min  Charges: OT General Charges $OT Visit: 1 Visit OT Treatments $Self Care/Home Management : 8-22 mins     Britt Bottom 01/13/2021, 2:46 PM

## 2021-01-13 NOTE — Progress Notes (Signed)
Family Medicine Teaching Service Daily Progress Note Intern Pager: [phone]  Patient name: Christopher Fernandez Medical record number: [id number] Date of birth: 01/12/1971 Age: 50 y.o. Gender: male  Primary Care Provider: Joana Reamer, DO Consultants: Neurosurgery, CT Surgery, ID Code Status: FULL  Pt Overview and Major Events to Date:  4/15: Admitted  4/22: T8-11 lateral fusion, T9-10 corpectomy, 1u pRBC 4/23: 2u pRBC 4/24: 1u pRBC 4/29: Right thoracotomy with drainage of pleural effusion  Assessment and Plan: Christopher Fernandez a 50 y.o.malewith worsening osteomyelitis and concern of new abscess/phlegmon in thoracic spine now s/p decompression of the thoracic spine with corpectomy reconstruction. PMH is significant forhistory of thoracic osteomyelitis, T2DM, peripheral neuropathy, chronic pancreatitis, HLD, HTN, asthma, seasonal allergies, GERD.  Osteomyelitis/discitiswithnew abscess/phlegmoninthoracic spine s/p T8-11 lateral fusion and T9-10 corpectomy on 4/22 Pain is well controlled. Patient does note that occasionally there is a delay when he asks for PRN medications and the pain will get so severe that he will require dilaudid for breakthrough pain. Given this, will trial scheduled Tylenol and Ibuprofen and d/c Ketorolac PRN. Continues on IV abx though sensitivities came back with resistance to Daptomycin. CK level WNL. -ID, Neurosurgery and CT Surgery following  -IV Daptomycin 1000 mg (4/15-5/1); Sensitivity came back with resistance  -IV Vancomycin (5/2-) Restarting clock for 6-8 week total duration -IV Rifampin 300 mg BID (4/15-) Restarting clock for 6-8 week total duration -D/c Doxycycline 100 mg q12h (4/27-5/1) -Lidocaine patch qd -Acetaminophen 1000 mg q6h  -Oxycodone 10 mg q6h moderate pain -Methocarbamol 500 mg q6h PRN muscle spasms -D/C Ketorolac 15 mg q6h PRN  -Scheduled Ibuprofen 400 mg TID  -Dilaudid 0.5 mg q4h PRN breakthrough pain -GI ppx: Senokot-S daily,  MiraLAX daily  -PT/OT recommendations: Home health PT. Equipment: rolling walker with 5" wheels and 3 in 1 bedside commode  Right Loculated Pleural Effusions s/p redo right thoracotomy 4/29 CT Surgery recommends removal of anterior chest tube today. Remaining tube potentially able to be removed tomorrow. Now on room air, saturating 94%.  -CT Surgery following, appreciate recommendations  Osteomyelitis of right hip/pubis -ID following; appreciate recommendations -Continue IV Daptomycin; weekly CK  Type 2 DM with peripheral neuropathy CBG's ranging 71-323 in last 24 hours. Has required 36U SSI in last 24 hours. -Switch from resistant to moderate SSI -Start 10 U Lantus tonight -Monitor CBG's AC/qHS -Continue Gabapentin 600 mg TID  HTN: chronic, stable BP ranging 119-142/69-93. Home meds include amlodipine 2.5 mg daily. -Holding amlodipine  Chronic pancreatitis with pancreatic insufficiency On creon TID with meals. -Continue Creon TID  Asthma and history of seasonal allergies -Albuterol PRN wheezing, SOB -Breo ellipta 1 puff daily  -Claritin 10 mg daily  -Singulair 10 mg daily   History of alcohol abuse/tobacco use disorder Patient states his last drink was about 1.5 years ago. Continues to smoke about half pack per day, previously smoked about a pack per day for 35 years. 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 -monitor clinically -PT/OT   Protein malnutrition Albumin 1.5, total protein 5.7.  -Continue Prosource feeding supplements TID between meals -Multivitamin with minerals daily   FEN/GI:  Carb modified diet PPx: Lovenox   Status is:  Inpatient  Remains inpatient appropriate because:IV treatments appropriate due to intensity of illness or inability to take PO and Inpatient level of care appropriate due to severity of illness   Dispo:  Patient From:    Planned Disposition: Home with Health Care Svc  Medically stable for discharge:        Subjective:  Patient feels that his pain is more adequately controlled. He was able to get out of bed independently this morning which he feels was a big step. He is enthusiastic about the progress he has made. He tells me about his visits with pharmacy outpatient in regards to his diabetes regimen. Some medication changes were made but unfortunately, since he has been back and forth to the hospital he has not been able to follow up. He notes he usually does a sliding scale at home and was recently started on a long-acting insulin but had some low blood sugars to the 30-40's with 18U so he cut back to 10U. He admits to eating candy last night which is what he attributes to his elevated sugars.   Objective: Temp:  [98 F (36.7 C)-98.4 F (36.9 C)] 98.1 F (36.7 C) (05/02 0455) Pulse Rate:  [84-101] 84 (05/02 0455) Resp:  [18-20] 18 (05/02 0455) BP: (106-131)/(73-93) 131/92 (05/02 0455) SpO2:  [91 %-97 %] 94 % (05/02 0455) FiO2 (%):  [28 %] 28 % (05/01 0854) Weight:  [109.1 kg] 109.1 kg (05/02 0005) Physical Exam: General: Awake, alert, in no distress, conversant, pleasant Cardiovascular: RRR without murmur Respiratory: Scant wheezing left upper lobe, with good air movement, chest tube in place posteriorly  Extremities: warm and dry without edema   Laboratory: Recent Labs  Lab 01/11/21 0313 01/11/21 0316 01/12/21 0610 01/13/21 0100  WBC 11.6*  --  10.4 11.4*  HGB 9.7* 10.2* 9.5* 9.1*  HCT 29.8* 30.0* 29.5* 28.3*  PLT 568*  --  643* 677*   Recent Labs  Lab 01/11/21 0313 01/11/21 0316 01/12/21 0610 01/13/21 0100  NA 132*  136 135 135  K 3.4* 3.5 3.8 4.2  CL 94*  --  97* 100  CO2 29  --  30 28  BUN 5*  --  7 12  CREATININE 0.66  --  0.67 0.77  CALCIUM 7.9*  --  8.0* 8.0*  PROT  --   --  5.7*  --   BILITOT  --   --  0.4  --   ALKPHOS  --   --  71  --   ALT  --   --  10  --   AST  --   --  12*  --   GLUCOSE 172*  --  130* 214*    Imaging/Diagnostic Tests: None new  Sabino Dick, DO 01/13/2021, 8:03 AM PGY-1, [facility] FPTS Intern pager: [phone], text pages welcome

## 2021-01-13 NOTE — Progress Notes (Signed)
Anterior chest tube removed, posterior chest tube still remaining and to suction.  VSS.

## 2021-01-13 NOTE — Progress Notes (Addendum)
Diagnosis: Disseminated MRSA infection   Culture Result: MRSA  Allergies  Allergen Reactions  . Eggs Or Egg-Derived Products Rash  . Morphine And Related Other (See Comments)    Cant take because of pancreatitis  . Cocoa Rash    OPAT Orders Discharge antibiotics to be given via PICC line Discharge antibiotics: Vancomycin  Per pharmacy protocol  Aim for Vancomycin trough 15-20 or AUC 400-550 (unless otherwise indicated) Duration: 6-8 weeks  End Date: 02/25/21  [facility] Care Per Protocol:  Home health RN for IV administration and teaching; PICC line care and labs.    Labs weekly while on IV antibiotics: X__ CBC with differential __ BMP X_ CMP X__ CRP X_ ESR X__ Vancomycin trough __ CK  X__ Please pull PIC at completion of IV antibiotics __ Please leave PIC in place until doctor has seen patient or been notified  Fax weekly labs to [phone]  Clinic Follow Up Appt: 5/12   '@9' :45 am

## 2021-01-13 NOTE — Progress Notes (Addendum)
      [address]       [place] [address]             [phone]      3 Days Post-Op Procedure(s) (LRB): REDO THORACOTOMY (Right) DRAINAGE OF PLEURAL EFFUSION (Right) Subjective: Says he is feeling better each day. Feeling stronger with mobility.  Objective: Vital signs in last 24 hours: Temp:  [98 F (36.7 C)-98.4 F (36.9 C)] 98.1 F (36.7 C) (05/02 0455) Pulse Rate:  [84-101] 84 (05/02 0455) Cardiac Rhythm: Normal sinus rhythm (05/01 1900) Resp:  [18-20] 18 (05/02 0455) BP: (106-131)/(73-93) 131/92 (05/02 0455) SpO2:  [91 %-97 %] 94 % (05/02 0455) FiO2 (%):  [28 %] 28 % (05/01 0854) Weight:  [109.1 kg] 109.1 kg (05/02 0005)     Intake/Output from previous day: 05/01 0701 - 05/02 0700 In: 310 [P.O.:240; IV Piggyback:70] Out: 655 [Urine:525; Chest Tube:130] Intake/Output this shift: No intake/output data recorded.  General appearance: alert and mild distress Heart: regular rate and rhythm Lungs: Breath sounds are clear, CT drainage is tapering off (120ml past 24 hours), no air leak.  Wound: The thoracotomy incision is intact and dry, there is a prominent soft tissue ridge posteriorly that is firm, does not appear fluid-filled.   Lab Results: Recent Labs    01/12/21 0610 01/13/21 0100  WBC 10.4 11.4*  HGB 9.5* 9.1*  HCT 29.5* 28.3*  PLT 643* 677*   BMET:  Recent Labs    01/12/21 0610 01/13/21 0100  NA 135 135  K 3.8 4.2  CL 97* 100  CO2 30 28  GLUCOSE 130* 214*  BUN 7 12  CREATININE 0.67 0.77  CALCIUM 8.0* 8.0*    PT/INR: No results for input(s): LABPROT, INR in the last 72 hours. ABG    Component Value Date/Time   PHART 7.484 (H) 01/11/2021 0316   HCO3 31.7 (H) 01/11/2021 0316   TCO2 33 (H) 01/11/2021 0316   O2SAT 96.0 01/11/2021 0316   CBG (last 3)  Recent Labs    01/12/21 1550 01/12/21 2028 01/13/21 0624  GLUCAP 281* 71 323*    Assessment/Plan: S/P Procedure(s) (LRB): REDO THORACOTOMY (Right) DRAINAGE OF  PLEURAL EFFUSION (Right)  --POD3 re-do right thoracotomy for drainage of a complex pleural effusion. Drainage is tapering off and respiratory status has improved. He is now on RA. There is no air leak.  CXR showing improved expansion of the right lung. Plan to remove the anterior tube today.    LOS: 16 days    [name] Kindle [phone] 01/13/2021   Chart reviewed, patient examined, agree with above. CXR stable. 130 cc drainage from tubes yesterday. Will remove anterior tube today and possibly remaining tube tomorrow. Continue antibiotics per ID.

## 2021-01-13 NOTE — Progress Notes (Signed)
Pharmacy Antibiotic Note  Christopher Fernandez is a 50 y.o. 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 He has been on daptomycin for this extensive infection since March he presented back in April for worsening back pain and found to have epidural abscess. His isolate from surgery on the 22nd was sent for daptomycin sensitivity and was found to be resistant. We will change to vancomyin for him at this time.   He was previously on vancomycin at the end of march and levels were obtained, his regimen was 1250mg  IV q12h, levels obtained at [facility] gave an AUC of 434. His renal function is the same as it was at that time and given the extensive infection, I will load with 2000mg  and start him on 1500mg  q12h for a predicted AUC of 520.   Plan: Stop daptomycin  Stop doxycycline  Vancomycin 2000mg  IV x1 Vancomycin 1500mg  IV q12h  Continue rifampin  Obtain levels at SS Monitor Scr daily   Height: 6\' 3"  (190.5 cm) Weight: 109.1 kg (240 lb 8 oz) IBW/kg (Calculated) : 84.5  Temp (24hrs), Avg:98.3 F (36.8 C), Min:98 F (36.7 C), Max:98.8 F (37.1 C)  Recent Labs  Lab 01/09/21 0450 01/10/21 0443 01/10/21 1300 01/11/21 0313 01/12/21 0610 01/13/21 0100  WBC 9.5 9.8  --  11.6* 10.4 11.4*  CREATININE  --  0.63 0.75 0.66 0.67 0.77    Estimated Creatinine Clearance: 144.1 mL/min (by C-G formula based on SCr of 0.77 mg/dL).    Allergies  Allergen Reactions  . Eggs Or Egg-Derived Products Rash  . Morphine And Related Other (See Comments)    Cant take because of pancreatitis  . Cocoa Rash    Antimicrobials this admission: Daptomycin 4/15>>5/1 Doxycycline 4/27>> 5/1 Rifampin 4/25 >> Vancomycin 5/2>>  Microbiology results: 4/27 BCx: ng x5 days  4/15 UCx: 1,000 diptheroids  4/22 thoracic wound: MRSA   Thank you for  allowing pharmacy to be a part of this patient's care.  Phillis Haggis 01/13/2021 9:36 AM

## 2021-01-13 NOTE — Progress Notes (Signed)
Nutrition Follow-up  DOCUMENTATION CODES:   Not applicable  INTERVENTION:   -30 ml Prosource Plus TID, each supplement provides 100 kcals and 15 grams protein  -MVI with minerals daily  -Encourage PO intake and medication compliance  NUTRITION DIAGNOSIS:   Increased nutrient needs related to post-op healing as evidenced by estimated needs.  Ongoing  GOAL:   Patient will meet greater than or equal to 90% of their needs   Progressing  MONITOR:   PO intake,Supplement acceptance,Labs,Weight trends,Skin,I & O's  REASON FOR ASSESSMENT:   Consult Assessment of nutrition requirement/status  ASSESSMENT:   Christopher Fernandez is a 50 y.o. male presenting with worsening osteomyelitis and concern of new abscess/phlegmon in thoracic spine after having a repeat MRI a few days ago. PMH is significant for history of thoracic osteomyelitis, type 2 diabetes, peripheral neuropathy, chronic pancreatitis/pancreatic insufficiency, hyperlipidemia, hypertension, asthma, seasonal allergies, GERD.   4/22 s/p vertebrectomy, osteomyelitis decompression of epidural abscess; T9-T10 osteodiscitis and a T8-T11 lateral fusion and T9-T10 corpectomy 4/29 s/p R thoracotomy redo, drainage R pleural effusion  One chest tube removed this am, plan removal of second tube tomorrow.   Appetite stable per patient. Last three meal completions charted as 50%, 100%, and 90%. Taking ProSource TID. Does not wish to have other supplements.   Admission weight: 106.6 kg  Current weight: 109.1 kg   UOP: 525 ml x 24 hrs   Medications: SS novolog, lantus, creon, remeron, miralax, zinc sulfate  Labs: CBG 71-323  Diet Order:   Diet Order            Diet Carb Modified Fluid consistency: Thin; Room service appropriate? Yes  Diet effective now                 EDUCATION NEEDS:   Education needs have been addressed  Skin:  Skin Assessment: Skin Integrity Issues: Skin Integrity Issues:: Incisions Incisions:  chest, R flank, leg  Last BM:  5/1  Height:   Ht Readings from Last 1 Encounters:  12/27/20 6\' 3"  (1.905 m)    Weight:   Wt Readings from Last 1 Encounters:  01/13/21 109.1 kg    Ideal Body Weight:  89 kg  BMI:  Body mass index is 30.06 kg/m.  Estimated Nutritional Needs:   Kcal:  7824-2353  Protein:  130-155 grams  Fluid:  > 2 L  Mariana Single RD, LDN Clinical Nutrition Pager listed in [place]

## 2021-01-13 NOTE — Progress Notes (Signed)
Mobility Specialist: Progress Note   01/13/21 1725  Mobility  Activity Refused mobility   Pt refused mobility due to back pain, RN notified. Pt said he is waiting on a heating pad. Will f/u tomorrow.   [facility] Valerian Jewel Mobility Specialist Mobility Specialist Phone: [phone]

## 2021-01-14 ENCOUNTER — Inpatient Hospital Stay (HOSPITAL_COMMUNITY): Payer: 59

## 2021-01-14 DIAGNOSIS — J90 Pleural effusion, not elsewhere classified: Secondary | ICD-10-CM | POA: Diagnosis not present

## 2021-01-14 DIAGNOSIS — Z9889 Other specified postprocedural states: Secondary | ICD-10-CM | POA: Diagnosis not present

## 2021-01-14 DIAGNOSIS — Z9689 Presence of other specified functional implants: Secondary | ICD-10-CM | POA: Diagnosis not present

## 2021-01-14 DIAGNOSIS — E1140 Type 2 diabetes mellitus with diabetic neuropathy, unspecified: Secondary | ICD-10-CM | POA: Diagnosis not present

## 2021-01-14 LAB — CBC WITH DIFFERENTIAL/PLATELET
Abs Immature Granulocytes: 0.12 10*3/uL — ABNORMAL HIGH (ref 0.00–0.07)
Basophils Absolute: 0 10*3/uL (ref 0.0–0.1)
Basophils Relative: 0 %
Eosinophils Absolute: 0.2 10*3/uL (ref 0.0–0.5)
Eosinophils Relative: 2 %
HCT: 31.5 % — ABNORMAL LOW (ref 39.0–52.0)
Hemoglobin: 10.2 g/dL — ABNORMAL LOW (ref 13.0–17.0)
Immature Granulocytes: 1 %
Lymphocytes Relative: 10 %
Lymphs Abs: 1.2 10*3/uL (ref 0.7–4.0)
MCH: 28 pg (ref 26.0–34.0)
MCHC: 32.4 g/dL (ref 30.0–36.0)
MCV: 86.5 fL (ref 80.0–100.0)
Monocytes Absolute: 0.8 10*3/uL (ref 0.1–1.0)
Monocytes Relative: 6 %
Neutro Abs: 10.3 10*3/uL — ABNORMAL HIGH (ref 1.7–7.7)
Neutrophils Relative %: 81 %
Platelets: 808 10*3/uL — ABNORMAL HIGH (ref 150–400)
RBC: 3.64 MIL/uL — ABNORMAL LOW (ref 4.22–5.81)
RDW: 14.5 % (ref 11.5–15.5)
WBC: 12.7 10*3/uL — ABNORMAL HIGH (ref 4.0–10.5)
nRBC: 0 % (ref 0.0–0.2)

## 2021-01-14 LAB — GLUCOSE, CAPILLARY
Glucose-Capillary: 170 mg/dL — ABNORMAL HIGH (ref 70–99)
Glucose-Capillary: 213 mg/dL — ABNORMAL HIGH (ref 70–99)
Glucose-Capillary: 269 mg/dL — ABNORMAL HIGH (ref 70–99)
Glucose-Capillary: 208 mg/dL — ABNORMAL HIGH (ref 70–99)

## 2021-01-14 LAB — BASIC METABOLIC PANEL
Anion gap: 10 (ref 5–15)
BUN: 8 mg/dL (ref 6–20)
CO2: 26 mmol/L (ref 22–32)
Calcium: 8.8 mg/dL — ABNORMAL LOW (ref 8.9–10.3)
Chloride: 98 mmol/L (ref 98–111)
Creatinine, Ser: 0.66 mg/dL (ref 0.61–1.24)
GFR, Estimated: >60 mL/min (ref >60)
Glucose, Bld: 165 mg/dL — ABNORMAL HIGH (ref 70–99)
Potassium: 4.3 mmol/L (ref 3.5–5.1)
Sodium: 134 mmol/L — ABNORMAL LOW (ref 135–145)

## 2021-01-14 MED ORDER — ALTEPLASE 2 MG IJ SOLR
2.0000 mg | Freq: Once | INTRAMUSCULAR | Status: AC
  Administered 2021-01-14: 2 mg
  Filled 2021-01-14: qty 2

## 2021-01-14 NOTE — Progress Notes (Signed)
Physical Therapy Treatment Patient Details Name: Christopher Fernandez MRN: [id number] DOB: June 20, 1971 Today's Date: 01/14/2021    History of Present Illness ELCHANAN BOB is a 50 y.o. male presenting with worsening osteomyelitis and concern of new abscess/phlegmon in thoracic spine after having a repeat MRI a few days ago. MRI evidence from 12/11/2010 worsening osteomyelitis of the parasymphyseal right superior pubic ramus with improvement in right abductor minimus myositis.s/p right-sided T7 thoracotomy with vertebrectomy of T9 and T10 for osteomyelitis decompression of epidural abscess reconstruction with Exe-Cort titanium implant and lateral plate fixation with traverse plate local autograft from T7 rib.  01/10/21 underwent redo R thoracotomy due to loculated R pleural effusion.  PMH is significant for history of thoracic osteomyelitis, type 2 diabetes, peripheral neuropathy, chronic pancreatitis/pancreatic insufficiency, hyperlipidemia, hypertension, asthma, seasonal allergies, GERD.    PT Comments    Pt received in supine, agreeable to therapy session and with good participation in gait and stair training with quad cane. Pt needing up to min guard for functional mobility tasks and VSS on RA during gait/stair trial. Pt continues to benefit from PT services to progress toward functional mobility goals. Continue to recommend HHPT, will continue to assess DME pending progress acutely.   Follow Up Recommendations  Home health PT     Equipment Recommendations  Rolling walker with 5" wheels;3in1 (PT) (pt has quad cane, final recs TBD)    Recommendations for Other Services       Precautions / Restrictions Precautions Precautions: Other (comment) Precaution Booklet Issued: No Required Braces or Orthoses: Other Brace Other Brace: Has a darco shoe from previous admission (approx 3 wks ago); Darco shoe is not in room, and pt was noncommital re: if MD officially cleared him to walk without it; Has been  consistently walking without it so far this admission Restrictions Weight Bearing Restrictions: No    Mobility  Bed Mobility Overal bed mobility: Modified Independent Bed Mobility: Rolling;Sidelying to Sit;Sit to Sidelying Rolling: Modified independent (Device/Increase time) Sidelying to sit: Modified independent (Device/Increase time);HOB elevated     Sit to sidelying: Modified independent (Device/Increase time) General bed mobility comments: able to perform without cues    Transfers Overall transfer level: Needs assistance Equipment used: Quad cane Transfers: Sit to/from Stand Sit to Stand: Supervision         General transfer comment: from EOB to QC  Ambulation/Gait Ambulation/Gait assistance: Min Gaffer (Feet): 500 Feet Assistive device: Quad cane Gait Pattern/deviations: Step-through pattern;Decreased stride length Gait velocity: decr; variable, grossly ~0.3-0.4 m/s   General Gait Details: pt at times reaching for hallway handrails but given cues not to due to contact precautions; pt still reaching for rails at times but no overt LOB; HR to 106 bpm, SpO2 WNL; mostly Supervision but min guard at times when reaching for hallway railing   Stairs Stairs: Yes Stairs assistance: Min guard Stair Management: With cane;Step to pattern;Forwards Number of Stairs: 1 General stair comments: Cues for sequencing and safety.  Tolerated well, used rail on L for stability when turning around prior to descending; min guard for stability at most   Wheelchair Mobility    Modified Rankin (Stroke Patients Only)       Balance Overall balance assessment: Needs assistance Sitting-balance support: No upper extremity supported;Feet supported Sitting balance-Leahy Scale: Good     Standing balance support: No upper extremity supported;During functional activity Standing balance-Leahy Scale: Fair Standing balance comment: able to stand at sink unsupported  but prefers 1-2 UE support with fatigue  Cognition Arousal/Alertness: Awake/alert Behavior During Therapy: WFL for tasks assessed/performed Overall Cognitive Status: Within Functional Limits for tasks assessed                                 General Comments: participatory      Exercises      General Comments General comments (skin integrity, edema, etc.): VSS on RA      Pertinent Vitals/Pain Pain Assessment: 0-10 Pain Score: 6  Pain Location: LBP Pain Descriptors / Indicators: Discomfort;Sore;Tender Pain Intervention(s): Monitored during session;Limited activity within patient's tolerance;Repositioned;Other (comment) (pt placing k-pad at his back at end of session)    Home Living                      Prior Function            PT Goals (current goals can now be found in the care plan section) Acute Rehab PT Goals Patient Stated Goal: get better PT Goal Formulation: With patient Time For Goal Achievement: 01/18/21 Potential to Achieve Goals: Good Progress towards PT goals: Progressing toward goals    Frequency    Min 3X/week      PT Plan Current plan remains appropriate       AM-PAC PT "6 Clicks" Mobility   Outcome Measure  Help needed turning from your back to your side while in a flat bed without using bedrails?: None Help needed moving from lying on your back to sitting on the side of a flat bed without using bedrails?: None Help needed moving to and from a bed to a chair (including a wheelchair)?: A Little Help needed standing up from a chair using your arms (e.g., wheelchair or bedside chair)?: A Little Help needed to walk in hospital room?: A Little Help needed climbing 3-5 steps with a railing? : A Little 6 Click Score: 20    End of Session Equipment Utilized During Treatment: Gait belt Activity Tolerance: Patient tolerated treatment well Patient left: in bed;with call bell/phone within  reach (pt putting k-pad behind his back) Nurse Communication: Mobility status PT Visit Diagnosis: Unsteadiness on feet (R26.81);Muscle weakness (generalized) (M62.81)     Time: 1700-1749 PT Time Calculation (min) (ACUTE ONLY): 20 min  Charges:  $Gait Training: 8-22 mins                     Kule Gascoigne P., PTA Acute Rehabilitation Services Pager: [phone] Office: [place] 01/14/2021, 5:42 PM

## 2021-01-14 NOTE — Progress Notes (Signed)
Family Medicine Teaching Service Daily Progress Note Intern Pager: [phone]  Patient name: Christopher Fernandez Medical record number: [id number] Date of birth: 12-17-1970 Age: 50 y.o. Gender: male  Primary Care Provider: Danna Hefty, DO Consultants: Neurosurgery, CT Surgery, ID Code Status: FULL  Pt Overview and Major Events to Date:  4/15: Admitted  4/22: T8-11 lateral fusion, T9-10 corpectomy, 1u pRBC 4/23: 2u pRBC 4/24: 1u pRBC 4/29: Right thoracotomy with drainage of pleural effusion  Assessment and Plan: RUE TINNEL a 50 y.o.malewith worsening osteomyelitis and concern of new abscess/phlegmon in thoracic spine now s/p decompression of the thoracic spine with corpectomy reconstruction. PMH is significant forhistory of thoracic osteomyelitis, T2DM, peripheral neuropathy, chronic pancreatitis, HLD, HTN, asthma, seasonal allergies, GERD.  Osteomyelitis/discitiswithnew abscess/phlegmoninthoracic spines/p T8-11 lateral fusion and T9-10 corpectomy on 4/22 Pain is adequately controlled, K pad seemed to help significantly overnight. He was up and walking in room just prior to my encounter. Working towards disposition home.  -ID, Neurosurgery and CT surgery following; appreciate care and recommendations -IV Vancomycin (5/2-) Will need 6-8 total weeks -IV Rifampin 300 mg BID- restarted clock yesterday. (5/2-) Will need 6-8 total weeks -Pain regimen:  Lidocaine patch qd  K-pad  Acetaminophen 1000 mg q6h  Ibuprofen 400 mg TID  Oxycodone 10 mg q6h   Methocarbamol 500 mg q6h PRN muscle spasms  Dilaudid 0.5 mg q4h PRN severe, breakthrough pain  GI Ppx: Senokot-S and MiraLAX daily    Right Loculated Pleural Effusions s/predo right thoracotomy 4/29 Anterior chest tube removed yesterday. Breathing is improved. -CT Surgery following, appreciate recommendations  Recommend removal of remaining tube today  Osteomyelitis of right hip/pubis -ID following; appreciate  recommendations  Type 2 DM with peripheral neuropathy CBG's ranging 111-293 in last 24 hours. Has required 5U SSI in last 24 hours. Supposed to be started on 10U Lantus yesterday though patient was hesitant about this and only elected to receive 5U. We discussed the importance of glucose control for wound healing and overall health.  -moderate SSI -10U Lantus  -Monitor CBG'sAC/qHS -Continue Gabapentin600 mg TID  HTN: chronic, stable BP ranging 120-128/75-90. Home meds include amlodipine 2.5 mg daily. -Holding amlodipine due to stable BP's  Chronic pancreatitis with pancreatic insufficiency  Hx Alcohol Abuse On creon TID with meals. -Continue Creon TID  Asthma and history of seasonal allergies -Albuterol PRN wheezing, SOB -Breo ellipta 1 puff daily  -Claritin 10 mg daily  -Singulair 10 mg daily  Anxiety Depression: chronic, stable -Abilify 5 mg  -Duloxetine 60 mg daily  -Mirtazapine 30 mg daily  GERD: chronic, stable -continue protonix  Hyperlipidemia: chronic, stable -Continue Atorvastatin 40 mg daily  Protein malnutrition -Continue Prosource feeding supplements TID between meals -Multivitamin with minerals daily  FEN/GI: Carb modified diet PPx: Lovenox   Status is: Inpatient  Remains inpatient appropriate because:Inpatient level of care appropriate due to severity of illness   Dispo:  Patient From:    Planned Disposition:    Medically stable for discharge:        Subjective:  Patient feels that he is improving every day. He is hesitant about continuing recovery at home as he states he is home alone most of the time since his wife works. He states that he received a lot of relief with K-pad. His wife joined at the end of encounter and her and patient agree to have patient try to not use Dilaudid today as we work towards disposition home. States that he did not want to get the entire 10U  of insulin last night because he was afraid that his  sugar would drop low. He was unaware that it was a long-acting insulin. No abdominal pain, had two bowel movements yesterday.   Objective: Temp:  [98 F (36.7 C)-99.9 F (37.7 C)] 99.9 F (37.7 C) (05/03 0357) Pulse Rate:  [84-97] 97 (05/03 0357) Resp:  [18-19] 18 (05/03 0357) BP: (120-128)/(75-90) 127/80 (05/03 0357) SpO2:  [92 %-98 %] 92 % (05/03 0357) Physical Exam: General: Awake, alert, in no distress, pleasant, cooperative Cardiovascular: RRR without murmur Respiratory: CTAB without wheezing/rhonchi/rales, chest tube in place Abdomen: soft, non-tender in all quadrants, non-distended Extremities: No edema b/l, warm and dry   Laboratory: Recent Labs  Lab 01/11/21 0313 01/11/21 0316 01/12/21 0610 01/13/21 0100  WBC 11.6*  --  10.4 11.4*  HGB 9.7* 10.2* 9.5* 9.1*  HCT 29.8* 30.0* 29.5* 28.3*  PLT 568*  --  643* 677*   Recent Labs  Lab 01/11/21 0313 01/11/21 0316 01/12/21 0610 01/13/21 0100  NA 132* 136 135 135  K 3.4* 3.5 3.8 4.2  CL 94*  --  97* 100  CO2 29  --  30 28  BUN 5*  --  7 12  CREATININE 0.66  --  0.67 0.77  CALCIUM 7.9*  --  8.0* 8.0*  PROT  --   --  5.7*  --   BILITOT  --   --  0.4  --   ALKPHOS  --   --  71  --   ALT  --   --  10  --   AST  --   --  12*  --   GLUCOSE 172*  --  130* 214*    Imaging/Diagnostic Tests: DG CHEST PORT 1 VIEW  Result Date: 01/14/2021 CLINICAL DATA:  Pleural effusion.  Chest tube. EXAM: PORTABLE CHEST 1 VIEW COMPARISON:  01/13/2021. FINDINGS: Right chest tubes and right PICC line stable position. No pneumothorax. Persistent right base atelectasis/infiltrate. Persistent small right pleural effusion. Heart size normal. Postsurgical changes thoracic spine again noted. IMPRESSION: 1. Right chest tube and right PICC line stable position. No pneumothorax. 2. Persistent right base atelectasis/infiltrate. Persistent small right pleural effusion. Electronically Signed   By: Marcello Moores  Register   On: 01/14/2021 06:47     Sharion Settler, DO 01/14/2021, 7:29 AM PGY-1, [place] Intern pager: [phone], text pages welcome

## 2021-01-14 NOTE — TOC Progression Note (Signed)
Transition of Care (TOC) - Progression Note  Marvetta Gibbons RN, BSN Transitions of Care Unit 4E- RN Case Manager See Treatment Team for direct phone #    Patient Details  Name: Christopher Fernandez MRN: [id number] Date of Birth: 07-19-1971  Transition of Care [facility]) CM/SW Contact  [name], [name], RN Phone Number: 01/14/2021, 4:40 PM  Clinical Narrative:    Noted pt will need IV abx for 8 wks with end date 6/14- spoke with wife [name]- regarding home IV abx, HH and DME needs. Per wife they would prefer to have [facility] again for [facility] needs- Pt was active with [facility] home infusion and [facility] for [facility] needs prior to admission- did not have any therapy at that time.  Call made to [facility] who is following for continued home infusion needs, reached out to [facility] with [name] for HHRN/PT/OT referral- per [name] is able to accept referral for [facility] needs again at this time. They will follow for potential d/c later this week.   In discussion with wife about DME needs- pt has RW at home, needs 3n1 and tub bench- have requested orders from MD- will call [facility] for DME delivery prior to discharge- wife understands tub bench will not be covered under insurance and will be out of pocket cost.         Expected Discharge Plan and Services      Home with [facility]                     DME Arranged: 3-N-1,Tub bench DME Agency: [facility]       HH Arranged: RN,PT,OT,IV Antibiotics HH Agency: [facility] Date [facility] Agency Contacted: 01/14/21 Time HH Agency Contacted: 1230 Representative spoke with at [place]: [name]   Social Determinants of Health ([place]) Interventions    Readmission Risk Interventions No flowsheet data found.

## 2021-01-14 NOTE — Progress Notes (Addendum)
      [id number]       [place] [address]             [phone]      4 Days Post-Op Procedure(s) (LRB): REDO THORACOTOMY (Right) DRAINAGE OF PLEURAL EFFUSION (Right) Subjective:  Doing well, says he is having less pain each day. Mobility improving. No new concerns.   Objective: Vital signs in last 24 hours: Temp:  [98 F (36.7 C)-99.9 F (37.7 C)] 99.9 F (37.7 C) (05/03 0357) Pulse Rate:  [84-97] 97 (05/03 0357) Cardiac Rhythm: Normal sinus rhythm (05/03 0700) Resp:  [18-19] 18 (05/03 0357) BP: (120-128)/(75-90) 127/80 (05/03 0357) SpO2:  [92 %-98 %] 92 % (05/03 0357)     Intake/Output from previous day: 05/02 0701 - 05/03 0700 In: 0  Out: 1680 [Urine:1600; Chest Tube:80] Intake/Output this shift: No intake/output data recorded.  General appearance: alert and mild distress Heart: regular rate and rhythm Lungs: Breath sounds are clear, CT drainage minimal (65ml past 24 hours), no air leak.  Wound: The thoracotomy incision is intact and dry,healing well.  Lab Results: Recent Labs    01/12/21 0610 01/13/21 0100  WBC 10.4 11.4*  HGB 9.5* 9.1*  HCT 29.5* 28.3*  PLT 643* 677*   BMET:  Recent Labs    01/12/21 0610 01/13/21 0100  NA 135 135  K 3.8 4.2  CL 97* 100  CO2 30 28  GLUCOSE 130* 214*  BUN 7 12  CREATININE 0.67 0.77  CALCIUM 8.0* 8.0*    PT/INR: No results for input(s): LABPROT, INR in the last 72 hours. ABG    Component Value Date/Time   PHART 7.484 (H) 01/11/2021 0316   HCO3 31.7 (H) 01/11/2021 0316   TCO2 33 (H) 01/11/2021 0316   O2SAT 96.0 01/11/2021 0316   CBG (last 3)  Recent Labs    01/13/21 1530 01/13/21 2040 01/14/21 0620  GLUCAP 229* 293* 170*    Assessment/Plan: S/P Procedure(s) (LRB): REDO THORACOTOMY (Right) DRAINAGE OF PLEURAL EFFUSION (Right)  --POD4 re-do right thoracotomy for drainage of a complex pleural effusion. Drainage is tapering off and respiratory status is stable. Remains on RA.  There is no air leak.  CXR is stable post removal of the anterior chest tube yesterday. Plan to remove the remaining tube today. ABX mgt per ID.    LOS: 17 days    [name] Kindle [phone] 01/14/2021   Chart reviewed, patient examined, agree with above. Feeling better daily. Pain under good control. Remaining chest tube removed today. Will do follow up CXR in am. Antibiotic switched to Vanc and Rifampin by ID due to Daptomycin resistance. When he goes home I will see him back in the office in two weeks with CXR and will remove the chest tube sutures at that time.

## 2021-01-15 ENCOUNTER — Inpatient Hospital Stay (HOSPITAL_COMMUNITY): Payer: 59

## 2021-01-15 DIAGNOSIS — R202 Paresthesia of skin: Secondary | ICD-10-CM | POA: Diagnosis not present

## 2021-01-15 DIAGNOSIS — D75839 Thrombocytosis, unspecified: Secondary | ICD-10-CM

## 2021-01-15 DIAGNOSIS — E1140 Type 2 diabetes mellitus with diabetic neuropathy, unspecified: Secondary | ICD-10-CM | POA: Diagnosis not present

## 2021-01-15 DIAGNOSIS — Z9889 Other specified postprocedural states: Secondary | ICD-10-CM | POA: Diagnosis not present

## 2021-01-15 LAB — CBC
HCT: 29.9 % — ABNORMAL LOW (ref 39.0–52.0)
Hemoglobin: 9.6 g/dL — ABNORMAL LOW (ref 13.0–17.0)
MCH: 27.9 pg (ref 26.0–34.0)
MCHC: 32.1 g/dL (ref 30.0–36.0)
MCV: 86.9 fL (ref 80.0–100.0)
Platelets: 780 10*3/uL — ABNORMAL HIGH (ref 150–400)
RBC: 3.44 MIL/uL — ABNORMAL LOW (ref 4.22–5.81)
RDW: 14.5 % (ref 11.5–15.5)
WBC: 10.6 10*3/uL — ABNORMAL HIGH (ref 4.0–10.5)
nRBC: 0 % (ref 0.0–0.2)

## 2021-01-15 LAB — BASIC METABOLIC PANEL
Anion gap: 5 (ref 5–15)
BUN: 11 mg/dL (ref 6–20)
CO2: 27 mmol/L (ref 22–32)
Calcium: 8.4 mg/dL — ABNORMAL LOW (ref 8.9–10.3)
Chloride: 101 mmol/L (ref 98–111)
Creatinine, Ser: 0.69 mg/dL (ref 0.61–1.24)
GFR, Estimated: >60 mL/min (ref >60)
Glucose, Bld: 138 mg/dL — ABNORMAL HIGH (ref 70–99)
Potassium: 4.2 mmol/L (ref 3.5–5.1)
Sodium: 133 mmol/L — ABNORMAL LOW (ref 135–145)

## 2021-01-15 LAB — GLUCOSE, CAPILLARY
Glucose-Capillary: 160 mg/dL — ABNORMAL HIGH (ref 70–99)
Glucose-Capillary: 200 mg/dL — ABNORMAL HIGH (ref 70–99)
Glucose-Capillary: 185 mg/dL — ABNORMAL HIGH (ref 70–99)
Glucose-Capillary: 171 mg/dL — ABNORMAL HIGH (ref 70–99)

## 2021-01-15 LAB — VANCOMYCIN, PEAK: Vancomycin Pk: 23 ug/mL — ABNORMAL LOW (ref 30–40)

## 2021-01-15 LAB — VANCOMYCIN, TROUGH: Vancomycin Tr: 11 ug/mL — ABNORMAL LOW (ref 15–20)

## 2021-01-15 MED ORDER — ALTEPLASE 2 MG IJ SOLR
2.0000 mg | Freq: Once | INTRAMUSCULAR | Status: AC
  Administered 2021-01-15: 2 mg
  Filled 2021-01-15: qty 2

## 2021-01-15 MED ORDER — OXYCODONE HCL 5 MG PO TABS
10.0000 mg | ORAL_TABLET | ORAL | Status: DC | PRN
  Administered 2021-01-15 – 2021-01-16 (×2): 10 mg via ORAL
  Filled 2021-01-15 (×2): qty 2

## 2021-01-15 MED ORDER — INSULIN GLARGINE 100 UNIT/ML ~~LOC~~ SOLN
12.0000 [IU] | Freq: Every day | SUBCUTANEOUS | Status: DC
  Administered 2021-01-15 – 2021-01-16 (×2): 12 [IU] via SUBCUTANEOUS
  Filled 2021-01-15 (×3): qty 0.12

## 2021-01-15 MED ORDER — IBUPROFEN 600 MG PO TABS
600.0000 mg | ORAL_TABLET | Freq: Three times a day (TID) | ORAL | Status: DC
  Administered 2021-01-15 – 2021-01-17 (×6): 600 mg via ORAL
  Filled 2021-01-15 (×6): qty 1

## 2021-01-15 NOTE — Progress Notes (Addendum)
Family Medicine Teaching Service Daily Progress Note Intern Pager: [phone]  Patient name: Christopher Fernandez Medical record number: [id number] Date of birth: 01-06-1971 Age: 50 y.o. Gender: male  Primary Care Provider: Danna Hefty, DO Consultants: Neurosurgery, CT Surgery, ID  Code Status: FULL  Pt Overview and Major Events to Date:  4/15: Admitted  4/22: T8-11 lateral fusion, T9-10 corpectomy, 1u pRBC 4/23: 2u pRBC 4/24: 1u pRBC 4/29:Right thoracotomy with drainage of pleural effusion 5/2: Removal of anterior chest tube 5/3: Removal of remaining chest tube  Assessment and Plan: Jemari Hallum Betzis a 50 y.o.malewith worsening osteomyelitis and concern of new abscess/phlegmon in thoracic spine now s/p decompression of the thoracic spine with corpectomy reconstruction. PMH is significant forhistory of thoracic osteomyelitis, T2DM, peripheral neuropathy, chronic pancreatitis, HLD, HTN, asthma, seasonal allergies, GERD.  Osteomyelitis/discitiswithnew abscess/phlegmoninthoracic spines/p T8-11 lateral fusion and T9-10 corpectomy on 4/22 Working towards disposition home.   Patient reports feeling slightly worse today as compared to yesterday.  He complains of tingling in both heels only when walking.  This is new.  Also complaining of new lower back pain.  Appreciate neurosurgery consultation regarding these symptoms, uncertain if further imaging would be required. Reassuringly, his WBC has improved and his vitals have been stable. -ID, Neurosurgery and CT surgery following; appreciate care and recommendations -Appreciate neurosurgery consultation and recommendation regarding new onset tingling -IV Vancomycin (5/2-) Will need 6-8 total weeks -IV Rifampin 300 mg BID- restarted clock. (5/2-) Will need 6-8 total weeks -Pain regimen:             Lidocaine patch qd             K-pad             Acetaminophen 1000 mg q6h             Ibuprofen 400 mg TID             Oxycodone 10 mg q6h                         Methocarbamol 500 mg q6h PRN muscle spasms             Dilaudid 0.5 mg q4h PRN severe, breakthrough pain             GI Ppx: Senokot-S and MiraLAX daily               Right Loculated Pleural Effusions s/predo right thoracotomy 4/29 Remaining chest tube removed yesterday. Repeat CXR today shows persistent right pleural effusion, no pneumothorax following removal of chest tubes.  Patient feels that his breathing is well. -CT Surgery recommendations: F/u outpatient in 2 weeks for repeat CXR and removal of sutures  Osteomyelitis of right hip/pubis -On abx as listed above  Type 2 DM with peripheral neuropathy CBG's ranging 160-269 in last 24 hours. Has required 16U SSI in last 24 hours.  -Increase to 12U Lantus  -moderate SSI -Monitor CBG'sAC/qHS -Continue Gabapentin600 mg TID  HTN: chronic, stable BP ranging 117-134/76-90.  -Holding home amlodipine due to stable BP's  Chronic pancreatitis with pancreatic insufficiency  Hx Alcohol Abuse Complained of transient abdominal pain last night.  Does not appear to be pancreatitis related as he was able to tolerate p.o. and has had resolution of pain.  We will continue to monitor.  On creon TID with meals. -Continue Creon TID -Monitor for worsening abdominal pain  Asthma and history of seasonal allergies -Albuterol PRN wheezing, SOB -Breo ellipta 1  puff daily  -Claritin 10 mg daily  -Singulair 10 mg daily  Anxiety Depression: chronic, stable -Abilify 5 mg  -Duloxetine 60 mg daily  -Mirtazapine 30 mg daily  GERD: chronic, stable -continue protonix  Hyperlipidemia: chronic, stable -Continue Atorvastatin 40 mg daily  Protein malnutrition -Continue Prosource feeding supplements TID between meals -Multivitamin with minerals daily  FEN/GI:Carb modified diet [id number]  Status is: Inpatient  Remains inpatient appropriate because:Inpatient level of care appropriate due to severity of  illness   Dispo:  Patient From:    Planned Disposition:    Medically stable for discharge:        Subjective:  Patient feels that he feels worse today than he did yesterday.  Now states that he felt worse yesterday than he did the day before as well.  He has a list of problems that he would like to discuss.  He tells me that he feels generally weak.  He has not had an appetite but he eats because he knows he needs a nourishment.  Also has noticed some tingling in both heels, this only occurs when he is walking.  This is a new symptom, he has never had this before. Also has noticed some new low back pain.  His concern is that the infection has gone to another part of his spine. Reports that he discussed this with the nurse and is hopeful to see the neurosurgeons today.  He is worried that if he goes home he will end up back here within a month.  He is still hopeful to be able to go home this week but does not feel comfortable leaving today.  Objective: Temp:  [98.2 F (36.8 C)-98.5 F (36.9 C)] 98.3 F (36.8 C) (05/04 0404) Pulse Rate:  [79-98] 82 (05/04 0404) Resp:  [16-20] 20 (05/04 0404) BP: (117-134)/(76-90) 124/82 (05/04 0404) SpO2:  [94 %-100 %] 100 % (05/04 0002) Physical Exam: General: Awake, alert, in no distress, conversational Cardiovascular: RRR, without murmur Respiratory: CTA B without wheezing/rhonchi/rales, diminished air movement right base Abdomen: Soft, nontender in all quadrants, nondistended Extremities: No swelling Back: Without any tenderness on palpation of spine  Laboratory: Recent Labs  Lab 01/12/21 0610 01/13/21 0100 01/14/21 0705  WBC 10.4 11.4* 12.7*  HGB 9.5* 9.1* 10.2*  HCT 29.5* 28.3* 31.5*  PLT 643* 677* 808*   Recent Labs  Lab 01/12/21 0610 01/13/21 0100 01/14/21 0705  NA 135 135 134*  K 3.8 4.2 4.3  CL 97* 100 98  CO2 30 28 26   BUN 7 12 8   CREATININE 0.67 0.77 0.66  CALCIUM 8.0* 8.0* 8.8*  PROT 5.7*  --   --   BILITOT 0.4  --    --   ALKPHOS 71  --   --   ALT 10  --   --   AST 12*  --   --   GLUCOSE 130* 214* 165*   Imaging/Diagnostic Tests: DG Chest 2 View  Result Date: 01/15/2021 CLINICAL DATA:  Status post chest tube removal. EXAM: CHEST - 2 VIEW COMPARISON:  Jan 14, 2021 FINDINGS: Chest tubes no longer appreciable on the right. Central catheter tip in superior vena cava. No pneumothorax. There is a right pleural effusion with areas of atelectatic change in the right mid and lower lung regions. Left lung clear. Heart size and pulmonary vascularity are normal. No adenopathy. Postoperative change noted in lower thoracic region. IMPRESSION: Chest tube removals without pneumothorax. Central catheter tip in superior vena cava. Persistent right pleural effusion with  right mid lung and right base atelectatic change. Left lung clear. Stable cardiac silhouette. Electronically Signed   By: Lowella Grip III M.D.   On: 01/15/2021 08:21     Sharion Settler, DO 01/15/2021, 6:35 AM PGY-1, [place] Intern pager: [phone], text pages welcome

## 2021-01-15 NOTE — Progress Notes (Signed)
Pharmacy Antibiotic Note  Christopher Fernandez is a 50 y.o. 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 He has been on daptomycin for this extensive infection since March he presented back in April for worsening back pain and found to have epidural abscess. His isolate from surgery on the 22nd was sent for daptomycin sensitivity and was found to be resistant. We will change to vancomyin for him at this time.   He was previously on vancomycin at the end of march and levels were obtained, his regimen was 1250mg  IV q12h, levels obtained at [facility] gave an AUC of 434. His renal function this admission was the same as it was at that time and given the extensive infection, he was loaded with 2000mg  and started on 1500mg  q12h for a predicted AUC of 520.   Today, he remains afebrile with WBC 10.6 and Scr 0.69. Vanc trough 11, vanc peak 23 on 1500 mg IV q12h.   Plan: Continue Vancomycin 1500mg  IV q12h (est AUC 432) Continue rifampin 300 mg PO q12h through 6/17  - Monitor clinical improvement and renal function   Height: 6\' 3"  (190.5 cm) Weight: 109.1 kg (240 lb 8 oz) IBW/kg (Calculated) : 84.5  Temp (24hrs), Avg:98.3 F (36.8 C), Min:98.2 F (36.8 C), Max:98.5 F (36.9 C)  Recent Labs  Lab 01/11/21 0313 01/12/21 0610 01/13/21 0100 01/14/21 0705 01/15/21 0627 01/15/21 0933  WBC 11.6* 10.4 11.4* 12.7* 10.6*  --   CREATININE 0.66 0.67 0.77 0.66 0.69  --   VANCOTROUGH  --   --   --   --   --  11*    Estimated Creatinine Clearance: 144.1 mL/min (by C-G formula based on SCr of 0.69 mg/dL).    Allergies  Allergen Reactions  . Eggs Or Egg-Derived Products Rash  . Morphine And Related Other (See Comments)    Cant take because of pancreatitis  . Cocoa Rash    Antimicrobials this admission: Daptomycin 4/15>>5/1 Doxycycline  4/27>> 5/1 Rifampin 4/25 >> Vancomycin 5/2>>  Microbiology results: 4/27 BCx: ng x5 days  4/15 UCx: 1,000 diptheroids  4/22 thoracic wound: MRSA   Thank you for allowing pharmacy to be a part of this patient's care.  Claudina Lick, PharmD PGY1 Acute Care Pharmacy Resident 01/15/2021 3:03 PM  Please check [email or web address] for unit-specific pharmacy phone numbers.

## 2021-01-15 NOTE — Progress Notes (Signed)
Mobility Specialist - Progress Note   01/15/21 1416  Mobility  Activity Ambulated in hall  Level of Assistance Standby assist, set-up cues, supervision of patient - no hands on  Assistive Device Four point cane  Distance Ambulated (ft) 410 ft  Mobility Response Tolerated well  Mobility performed by Mobility specialist  $Mobility charge 1 Mobility   Pre-mobility: 93 HR During mobility: 105 HR Post-mobility: 91 HR  Pt stated his back pain and heel numbness did not worsen w/ ambulation. Pt back in bed after walk.   Pricilla Handler Mobility Specialist Mobility Specialist Phone: [phone]

## 2021-01-15 NOTE — Progress Notes (Signed)
Patient ID: Christopher Fernandez, male   DOB: February 19, 1971, 50 y.o.   MRN: [id number] Patient recovering well from surgery.  He notes that he is having some increasing back pain over the last few days and he is having some numbness in the bottom of his heel.  This does not appear to have a radicular component.  He notes that the numbness in his heel feels a bit different than his typical neuropathy.  He notes his back pain has been worse when he has been trying to walk.  I have advised and reviewed his films and will order an MRI of the lumbar spine with and without contrast

## 2021-01-15 NOTE — Progress Notes (Signed)
Occupational Therapy Treatment Patient Details Name: Christopher Fernandez MRN: [id number] DOB: 1971-05-12 Today's Date: 01/15/2021    History of present illness Christopher Fernandez is a 50 y.o. male presenting with worsening osteomyelitis and concern of new abscess/phlegmon in thoracic spine after having a repeat MRI a few days ago. MRI evidence from 12/11/2010 worsening osteomyelitis of the parasymphyseal right superior pubic ramus with improvement in right abductor minimus myositis.s/p right-sided T7 thoracotomy with vertebrectomy of T9 and T10 for osteomyelitis decompression of epidural abscess reconstruction with Exe-Cort titanium implant and lateral plate fixation with traverse plate local autograft from T7 rib.  01/10/21 underwent redo R thoracotomy due to loculated R pleural effusion.  PMH is significant for history of thoracic osteomyelitis, type 2 diabetes, peripheral neuropathy, chronic pancreatitis/pancreatic insufficiency, hyperlipidemia, hypertension, asthma, seasonal allergies, GERD.   OT comments  Pt continues to make good progress with functional goals. Pt with c/o low back pain that he states is new and that he will be getting an MRI (MD note states that pt to get and MRI as well). Sessio focused on functional mobility with RW for AD mobility tolerance, LB ADLs/grooming tasks standing and tub shower transfer simulation. OT will continue to follow acutely to maximize level of function and safety  Follow Up Recommendations  Home health OT    Equipment Recommendations  3 in 1 bedside commode;Other (comment) (RW, reacher)    Recommendations for Other Services      Precautions / Restrictions Precautions Precautions: Fall Required Braces or Orthoses: Other Brace Other Brace: Has a darco shoe from previous admission (approx 3 wks ago); Darco shoe is not in room, and pt was noncommital re: if MD officially cleared him to walk without it; Has been consistently walking without it so far this admission        Mobility Bed Mobility Overal bed mobility: Modified Independent                  Transfers Overall transfer level: Needs assistance Equipment used: Standard walker Transfers: Sit to/from Stand Sit to Stand: Supervision         General transfer comment: pt requested use of RW this session    Balance Overall balance assessment: Needs assistance Sitting-balance support: No upper extremity supported;Feet supported Sitting balance-Leahy Scale: Good     Standing balance support: No upper extremity supported;During functional activity Standing balance-Leahy Scale: Fair                             ADL either performed or assessed with clinical judgement   ADL Overall ADL's : Needs assistance/impaired     Grooming: Wash/dry hands;Wash/dry face;Supervision/safety;Standing       Lower Body Bathing: Min guard;Sit to/from stand       Lower Body Dressing: Min guard;Sit to/from stand   Toilet Transfer: Supervision/safety;Ambulation;RW   Toileting- Clothing Manipulation and Hygiene: Supervision/safety;Sit to/from stand   Tub/ Shower Transfer: Min guard;Ambulation;Rolling walker;Grab bars Tub/Shower Transfer Details (indicate cue type and reason): simulated step over for tub shower         Vision Baseline Vision/History: Wears glasses Patient Visual Report: No change from baseline     Perception     Praxis      Cognition Arousal/Alertness: Awake/alert Behavior During Therapy: WFL for tasks assessed/performed Overall Cognitive Status: Within Functional Limits for tasks assessed  Exercises     Shoulder Instructions       General Comments      Pertinent Vitals/ Pain       Pain Assessment: 0-10 Pain Score: 7  Pain Location: low back pain Pain Descriptors / Indicators: Aching Pain Intervention(s): Monitored during session;Repositioned;RN gave pain meds during session  Home  Living                                          Prior Functioning/Environment              Frequency  Min 2X/week        Progress Toward Goals  OT Goals(current goals can now be found in the care plan section)  Progress towards OT goals: Progressing toward goals  Acute Rehab OT Goals Patient Stated Goal: get better OT Goal Formulation: With patient  Plan Discharge plan remains appropriate    Co-evaluation                 AM-PAC OT "6 Clicks" Daily Activity     Outcome Measure   Help from another person eating meals?: None Help from another person taking care of personal grooming?: A Little Help from another person toileting, which includes using toliet, bedpan, or urinal?: A Little Help from another person bathing (including washing, rinsing, drying)?: A Little Help from another person to put on and taking off regular upper body clothing?: None Help from another person to put on and taking off regular lower body clothing?: A Little 6 Click Score: 20    End of Session Equipment Utilized During Treatment: Rolling walker;Gait belt  OT Visit Diagnosis: Other abnormalities of gait and mobility (R26.89);Pain Pain - part of body:  (low back)   Activity Tolerance     Patient Left with call bell/phone within reach;in chair   Nurse Communication          Time: 9480-1655 OT Time Calculation (min): 18 min  Charges: OT General Charges $OT Visit: 1 Visit OT Treatments $Self Care/Home Management : 8-22 mins     Britt Bottom 01/15/2021, 2:19 PM

## 2021-01-15 NOTE — Progress Notes (Signed)
5 Days Post-Op Procedure(s) (LRB): REDO THORACOTOMY (Right) DRAINAGE OF PLEURAL EFFUSION (Right) Subjective:  Chest wall pain well-controlled.  Reports feeling more tired over the past couple days, decreased appetite, some numbness in heal of left foot and new low back pain.  Objective: Vital signs in last 24 hours: Temp:  [98.2 F (36.8 C)-98.5 F (36.9 C)] 98.3 F (36.8 C) (05/04 0404) Pulse Rate:  [79-85] 82 (05/04 0404) Cardiac Rhythm: Normal sinus rhythm (05/04 0404) Resp:  [16-20] 20 (05/04 0404) BP: (117-134)/(76-84) 124/82 (05/04 0404) SpO2:  [98 %-100 %] 100 % (05/04 0002)  Hemodynamic parameters for last 24 hours:    Intake/Output from previous day: 05/03 0701 - 05/04 0700 In: 540 [P.O.:240; IV Piggyback:300] Out: 875 [Urine:875] Intake/Output this shift: No intake/output data recorded.  General appearance: alert and cooperative Heart: regular rate and rhythm, S1, S2 normal, no murmur Lungs: diminished breath sounds right base Wound: incision healing well.  Lab Results: Recent Labs    01/14/21 0705 01/15/21 0627  WBC 12.7* 10.6*  HGB 10.2* 9.6*  HCT 31.5* 29.9*  PLT 808* 780*   BMET:  Recent Labs    01/14/21 0705 01/15/21 0627  NA 134* 133*  K 4.3 4.2  CL 98 101  CO2 26 27  GLUCOSE 165* 138*  BUN 8 11  CREATININE 0.66 0.69  CALCIUM 8.8* 8.4*    PT/INR: No results for input(s): LABPROT, INR in the last 72 hours. ABG    Component Value Date/Time   PHART 7.484 (H) 01/11/2021 0316   HCO3 31.7 (H) 01/11/2021 0316   TCO2 33 (H) 01/11/2021 0316   O2SAT 96.0 01/11/2021 0316   CBG (last 3)  Recent Labs    01/14/21 1624 01/14/21 2119 01/15/21 0616  GLUCAP 269* 208* 160*   Narrative & Impression  CLINICAL DATA:  Status post chest tube removal.  EXAM: CHEST - 2 VIEW  COMPARISON:  Jan 14, 2021  FINDINGS: Chest tubes no longer appreciable on the right. Central catheter tip in superior vena cava. No pneumothorax. There is a right  pleural effusion with areas of atelectatic change in the right mid and lower lung regions. Left lung clear. Heart size and pulmonary vascularity are normal. No adenopathy. Postoperative change noted in lower thoracic region.  IMPRESSION: Chest tube removals without pneumothorax. Central catheter tip in superior vena cava. Persistent right pleural effusion with right mid lung and right base atelectatic change. Left lung clear. Stable cardiac silhouette.   Electronically Signed   By: Lowella Grip III M.D.   On: 01/15/2021 08:21     Assessment/Plan: S/P Procedure(s) (LRB): REDO THORACOTOMY (Right) DRAINAGE OF PLEURAL EFFUSION (Right)  CXR looks good after last tube removed yesterday. Continue IS, ambulation.  Antibiotics per ID.  He will discuss his heel numbness and back pain with neurosurgery.  When he goes home I will see him back in 2 wks with CXR and remove the chest tube sutures at that time.   LOS: 18 days    Gaye Pollack 01/15/2021

## 2021-01-16 ENCOUNTER — Inpatient Hospital Stay (HOSPITAL_COMMUNITY): Payer: 59

## 2021-01-16 DIAGNOSIS — Z9889 Other specified postprocedural states: Secondary | ICD-10-CM | POA: Diagnosis not present

## 2021-01-16 DIAGNOSIS — Z794 Long term (current) use of insulin: Secondary | ICD-10-CM | POA: Diagnosis not present

## 2021-01-16 DIAGNOSIS — E1140 Type 2 diabetes mellitus with diabetic neuropathy, unspecified: Secondary | ICD-10-CM | POA: Diagnosis not present

## 2021-01-16 DIAGNOSIS — R202 Paresthesia of skin: Secondary | ICD-10-CM | POA: Diagnosis not present

## 2021-01-16 LAB — CBC
HCT: 28.6 % — ABNORMAL LOW (ref 39.0–52.0)
Hemoglobin: 9.2 g/dL — ABNORMAL LOW (ref 13.0–17.0)
MCH: 28.3 pg (ref 26.0–34.0)
MCHC: 32.2 g/dL (ref 30.0–36.0)
MCV: 88 fL (ref 80.0–100.0)
Platelets: 809 10*3/uL — ABNORMAL HIGH (ref 150–400)
RBC: 3.25 MIL/uL — ABNORMAL LOW (ref 4.22–5.81)
RDW: 14.5 % (ref 11.5–15.5)
WBC: 10 10*3/uL (ref 4.0–10.5)
nRBC: 0 % (ref 0.0–0.2)

## 2021-01-16 LAB — AEROBIC/ANAEROBIC CULTURE W GRAM STAIN (SURGICAL/DEEP WOUND): Special Requests: 10

## 2021-01-16 LAB — GLUCOSE, CAPILLARY
Glucose-Capillary: 143 mg/dL — ABNORMAL HIGH (ref 70–99)
Glucose-Capillary: 207 mg/dL — ABNORMAL HIGH (ref 70–99)
Glucose-Capillary: 168 mg/dL — ABNORMAL HIGH (ref 70–99)
Glucose-Capillary: 112 mg/dL — ABNORMAL HIGH (ref 70–99)

## 2021-01-16 LAB — BASIC METABOLIC PANEL
Anion gap: 8 (ref 5–15)
BUN: 13 mg/dL (ref 6–20)
CO2: 28 mmol/L (ref 22–32)
Calcium: 8.6 mg/dL — ABNORMAL LOW (ref 8.9–10.3)
Chloride: 100 mmol/L (ref 98–111)
Creatinine, Ser: 0.64 mg/dL (ref 0.61–1.24)
GFR, Estimated: >60 mL/min (ref >60)
Glucose, Bld: 141 mg/dL — ABNORMAL HIGH (ref 70–99)
Potassium: 4.4 mmol/L (ref 3.5–5.1)
Sodium: 136 mmol/L (ref 135–145)

## 2021-01-16 LAB — LIPASE, BLOOD: Lipase: 18 U/L (ref 11–51)

## 2021-01-16 MED ORDER — GADOBUTROL 1 MMOL/ML IV SOLN
10.0000 mL | Freq: Once | INTRAVENOUS | Status: AC | PRN
  Administered 2021-01-16: 10 mL via INTRAVENOUS

## 2021-01-16 MED ORDER — OXYCODONE HCL 5 MG PO TABS
5.0000 mg | ORAL_TABLET | ORAL | Status: DC | PRN
  Administered 2021-01-16 – 2021-01-17 (×2): 5 mg via ORAL
  Filled 2021-01-16 (×2): qty 1

## 2021-01-16 NOTE — Progress Notes (Signed)
Physical Therapy Treatment Patient Details Name: Christopher Fernandez MRN: [id number] DOB: 05-24-1971 Today's Date: 01/16/2021    History of Present Illness Christopher Fernandez is a 50 y.o. male presenting with worsening osteomyelitis and concern of new abscess/phlegmon in thoracic spine after having a repeat MRI a few days ago. MRI evidence from 12/11/2010 worsening osteomyelitis of the parasymphyseal right superior pubic ramus with improvement in right abductor minimus myositis.s/p right-sided T7 thoracotomy with vertebrectomy of T9 and T10 for osteomyelitis decompression of epidural abscess reconstruction with Exe-Cort titanium implant and lateral plate fixation with traverse plate local autograft from T7 rib.  01/10/21 underwent redo R thoracotomy due to loculated R pleural effusion.  PMH is significant for history of thoracic osteomyelitis, type 2 diabetes, peripheral neuropathy, chronic pancreatitis/pancreatic insufficiency, hyperlipidemia, hypertension, asthma, seasonal allergies, GERD.    PT Comments    Pt received in supine, agreeable to limited therapy session due to pain and with good participation in supine/seated LE therapeutic exercises and chair transfer. Pt needing up to Supervision for transfers and no assist for bed mobility with good technique for log rolling. Pt agreeable to try using ice rather than heat for back pain, as k-pad heat has not helped with symptoms and in fact pain has increased while he has been using this modality. Pt continues to benefit from PT services to progress toward functional mobility goals. Continue to recommend HHPT.   Follow Up Recommendations  Home health PT     Equipment Recommendations  Rolling walker with 5" wheels;3in1 (PT) (pt has quad cane, final recs TBD)    Recommendations for Other Services       Precautions / Restrictions Precautions Precautions: Fall Required Braces or Orthoses: Other Brace Other Brace: Has a darco shoe from previous admission  (approx 3 wks ago); Darco shoe is not in room, and pt was noncommital re: if MD officially cleared him to walk without it; Has been consistently walking without it so far this admission Restrictions Weight Bearing Restrictions: No    Mobility  Bed Mobility Overal bed mobility: Modified Independent Bed Mobility: Supine to Sit;Rolling Rolling: Modified independent (Device/Increase time) Sidelying to sit: Modified independent (Device/Increase time)       General bed mobility comments: use of bed features/no physical assist needed    Transfers Overall transfer level: Needs assistance Equipment used: None Transfers: Sit to/from [employer] Transfers Sit to Stand: Supervision Stand pivot transfers: Supervision       General transfer comment: using bed rail for support to push up from  Ambulation/Gait             General Gait Details: pt deferring due to increased pain   Stairs             Wheelchair Mobility    Modified Rankin (Stroke Patients Only)       Balance Overall balance assessment: Needs assistance Sitting-balance support: No upper extremity supported;Feet supported Sitting balance-Leahy Scale: Good     Standing balance support: No upper extremity supported;During functional activity Standing balance-Leahy Scale: Fair Standing balance comment: no LOB for stand pivot transfer without assistive device                            Cognition Arousal/Alertness: Awake/alert Behavior During Therapy: WFL for tasks assessed/performed Overall Cognitive Status: Within Functional Limits for tasks assessed  General Comments: participatory with max encouragement; depressed affect      Exercises General Exercises - Lower Extremity Ankle Circles/Pumps: AROM;Both;15 reps;Supine Gluteal Sets: AROM;Both;10 reps;Supine Long Arc Quad: AROM;Both;10 reps;Seated Heel Slides: AROM;Both;10  reps;Supine Hip ABduction/ADduction: AROM;Both;10 reps;Supine Straight Leg Raises: AROM;Both;10 reps;Supine (encouraged core activation during ex to reduce low back strain)    General Comments General comments (skin integrity, edema, etc.): discussed use of ice rather than heating pack due to likely inflammatory process increasing back pain while he has been using heating k-pad past few days, encouraged ice 20-30 mins on/off Scientist, product/process development states she will look into getting ice gel packs for him which will likely fit better than regular ice packs which are bulky for LBP)      Pertinent Vitals/Pain Pain Assessment: 0-10 Pain Score: 7  Pain Location: low back pain Pain Descriptors / Indicators: Aching;Sharp;Constant Pain Intervention(s): Limited activity within patient's tolerance;Monitored during session;Repositioned;Ice applied (up to chair with ice pack)    Home Living                      Prior Function            PT Goals (current goals can now be found in the care plan section) Acute Rehab PT Goals Patient Stated Goal: get better PT Goal Formulation: With patient Time For Goal Achievement: 01/18/21 Potential to Achieve Goals: Good Progress towards PT goals: Progressing toward goals    Frequency    Min 3X/week      PT Plan Current plan remains appropriate    Co-evaluation              AM-PAC PT "6 Clicks" Mobility   Outcome Measure  Help needed turning from your back to your side while in a flat bed without using bedrails?: None Help needed moving from lying on your back to sitting on the side of a flat bed without using bedrails?: None Help needed moving to and from a bed to a chair (including a wheelchair)?: A Little Help needed standing up from a chair using your arms (e.g., wheelchair or bedside chair)?: A Little Help needed to walk in hospital room?: A Little Help needed climbing 3-5 steps with a railing? : A Little 6 Click Score: 20    End of  Session   Activity Tolerance: Patient tolerated treatment well Patient left: in chair;with call bell/phone within reach;with nursing/sitter in room (RN entering room at end of session) Nurse Communication: Mobility status PT Visit Diagnosis: Unsteadiness on feet (R26.81);Muscle weakness (generalized) (M62.81)     Time: 1202-1224 PT Time Calculation (min) (ACUTE ONLY): 22 min  Charges:  $Therapeutic Exercise: 8-22 mins                     Dominique Ressel P., PTA Acute Rehabilitation Services Pager: [phone] Office: [place] 01/16/2021, 2:27 PM

## 2021-01-16 NOTE — Progress Notes (Addendum)
Family Medicine Teaching Service Daily Progress Note Intern Pager: [phone]  Patient name: Christopher Fernandez Medical record number: [id number] Date of birth: 02-07-1971 Age: 50 y.o. Gender: male  Primary Care Provider: Danna Hefty, DO Consultants: Neurosurgery, CT Surgery, ID Code Status: FULL  Pt Overview and Major Events to Date:  4/15: Admitted  4/22: T8-11 lateral fusion, T9-10 corpectomy, 1u pRBC 4/23: 2u pRBC 4/24: 1u pRBC 4/29:Right thoracotomy with drainage of pleural effusion 5/2: Removal of anterior chest tube 5/3: Removal of remaining chest tube  Assessment and Plan: Christopher Fernandez a 50 y.o.malewith worsening osteomyelitis and concern of new abscess/phlegmon in thoracic spine now s/p decompression of the thoracic spine with corpectomy reconstruction. PMH is significant forhistory of thoracic osteomyelitis, T2DM, peripheral neuropathy, chronic pancreatitis, HLD, HTN, asthma, seasonal allergies, GERD.  Osteomyelitis/discitiswithnew abscess/phlegmoninthoracic spines/p T8-11 lateral fusion and T9-10 corpectomy on 4/22 Working towards disposition home. Given his new complaints of low back pain and tingling in his heels, MRI of lumbar spine was ordered by Neurosurgery. Patient does have hx of neuropathy and these symptoms do not appear radicular. Still awaiting imaging to be completed. -ID, Neurosurgery and CT surgery following; appreciate care and recommendations -F/u MRI Lumbar spine  -IV Vancomycin (5/2-)Will need 6-8 total weeks -IV Rifampin 300 mg BID- restarted clock. (5/2-)Will need 6-8 total weeks -Pain regimen: Lidocaine patch qd K-pad Acetaminophen 1000 mg q6h Ibuprofen 600 mg TID Oxycodone 10 mg q6h Methocarbamol 500 mg q6h PRN muscle spasms  Oxycodone 5 mg q4h PRN breakthrough pain GI Ppx: Senokot-S and MiraLAX daily   Right  Loculated Pleural Effusions s/predo right thoracotomy 4/29 -CT Surgery recommendations: F/u outpatient in 2 weeks for repeat CXR and removal of sutures  Reactive Thrombocytosis Platelets have been consistently elevated. Most likely reactive in nature given infection.  -F/u outpatient -Consider Hematology referral if does not improve s/p antibiotics and recovery   Osteomyelitis of right hip/pubis -On abx as listed above  Type 2 DM with peripheral neuropathy CBG's ranging141-200in last 24 hours. Has required 6U SSI in last 24 hours. -Continue 12U Lantus  -moderate SSI -Monitor CBG'sAC/qHS -Continue Gabapentin600 mg TID  HTN: chronic, stable BP ranging117-132/76-90.  -Holding home amlodipinedue to stable BP's  Chronic pancreatitis with pancreatic insufficiency Hx Alcohol Abuse Lipase WNL which is reassuring.  Patient describes pain as soreness and it is more right-sided, low concern for pancreatitis at this time.  On creon TID with meals. -Continue Creon TID -Monitor for worsening abdominal pain  Asthma and history of seasonal allergies -Albuterol PRN wheezing, SOB -Breo ellipta 1 puff daily  -Claritin 10 mg daily  -Singulair 10 mg daily  Anxiety Depression: chronic, stable -Abilify 5 mg  -Duloxetine 60 mg daily  -Mirtazapine 30 mg daily  GERD: chronic, stable -continue protonix  Hyperlipidemia: chronic, stable -Continue Atorvastatin 40 mg daily  Protein malnutrition -Continue Prosource feeding supplements TID between meals -Multivitamin with minerals daily  FEN/GI:Carb modified diet WFU:XNATFTD   Status is: Inpatient  Remains inpatient appropriate because:Ongoing diagnostic testing needed not appropriate for outpatient work up   Dispo:  Patient From:    Planned Disposition:    Medically stable for discharge:        Subjective:  Patient feels that his pain is a little worsened after the MRI.  Otherwise, he feels that he is  progressing daily.  Feels pain has been adequately controlled even with discontinuation of Dilaudid yesterday.  Abdominal pain is more left-sided and he describes it as feeling "sore".  He has been  able to tolerate p.o.  Continues to note tingling sensation in both heels.  He is amenable to potential discharge today if MRI results return and are reassuring.  Objective: Temp:  [97.9 F (36.6 C)-98.3 F (36.8 C)] 98.3 F (36.8 C) (05/05 0355) Pulse Rate:  [62-95] 82 (05/05 0355) Resp:  [17-18] 18 (05/05 0355) BP: (117-132)/(76-90) 129/90 (05/05 0355) SpO2:  [89 %-100 %] 89 % (05/04 2035) Physical Exam: General: Awake, alert, no distress, laying in bed Cardiovascular: RRR, no murmur Respiratory: CTAB with slightly diminished breath sounds right base Abdomen: Soft, mild tenderness to palpation of right upper quadrant and right lower quadrant without rebound or guarding, nondistended Extremities: No edema, warm and dry  Laboratory: Recent Labs  Lab 01/14/21 0705 01/15/21 0627 01/16/21 0424  WBC 12.7* 10.6* 10.0  HGB 10.2* 9.6* 9.2*  HCT 31.5* 29.9* 28.6*  PLT 808* 780* 809*   Recent Labs  Lab 01/12/21 0610 01/13/21 0100 01/14/21 0705 01/15/21 0627 01/16/21 0424  NA 135   < > 134* 133* 136  K 3.8   < > 4.3 4.2 4.4  CL 97*   < > 98 101 100  CO2 30   < > 26 27 28   BUN 7   < > 8 11 13   CREATININE 0.67   < > 0.66 0.69 0.64  CALCIUM 8.0*   < > 8.8* 8.4* 8.6*  PROT 5.7*  --   --   --   --   BILITOT 0.4  --   --   --   --   ALKPHOS 71  --   --   --   --   ALT 10  --   --   --   --   AST 12*  --   --   --   --   GLUCOSE 130*   < > 165* 138* 141*   < > = values in this interval not displayed.    Imaging/Diagnostic Tests: MRI lumbar spine pending.  Sharion Settler, DO 01/16/2021, 6:25 AM PGY-1, [place] Intern pager: [phone], text pages welcome

## 2021-01-16 NOTE — Progress Notes (Signed)
Mobility Specialist - Progress Note   01/16/21 1400  Mobility  Activity Ambulated in hall  Level of Assistance Standby assist, set-up cues, supervision of patient - no hands on  Assistive Device Front wheel walker  Distance Ambulated (ft) 470 ft  Mobility Response Tolerated well  Mobility performed by Mobility specialist  $Mobility charge 1 Mobility   Pt endorsed "tightness" in his lower back w/ ambulation, but was otherwise asx. Pt to recliner after walk, call bell at side.   Pricilla Handler Mobility Specialist Mobility Specialist Phone: [phone]

## 2021-01-16 NOTE — Telephone Encounter (Signed)
Called to check on referral status. Patient has still no been scheduled and was informed it could be a few more weeks as the referral is still pending review.

## 2021-01-16 NOTE — Discharge Summary (Signed)
[facility] Discharge Summary  Patient name: Christopher Fernandez Medical record number: [id number] Date of birth: [birth year] Age: 50 y.o. 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 rate 16, height '6\' 3"'  (1.905 m), weight 109.1 kg, SpO2 96 %. Physical Exam: General: Awake, alert, no distress, laying in bed Cardiovascular: RRR, no murmur Respiratory: CTAB with slightly diminished breath sounds right base Abdomen: Soft, mild tenderness to palpation of right upper quadrant and right lower quadrant without rebound or guarding, nondistended Extremities: No edema, warm and dry  Brief Hospital Course:  Christopher Fernandez is a 50 y.o. male presenting with worsening osteomyelitis and concern of new abscess/phlegmon in thoracic spine after having a repeat MRI a few days ago. PMH is significant for history of thoracic osteomyelitis, type 2 diabetes, peripheral neuropathy, chronic pancreatitis/pancreatic insufficiency, hyperlipidemia, hypertension, asthma, seasonal allergies, GERD.   Osteomyelitis/discitis with new abscess/phlegmon in thoracic spine Patient presented to the ED with progressively worse back pain. MRI notable for worsening progression of osteomyelitis to continuous vertebra T8 and  T11 with circumferential epidural abscess/phlegmon at T9-T10 with moderate stenosis at that level. Labs notable for lactic acid 1.3 without leukocytosis. Blood cultures notable for no growth for 5 days. Neurosurgery consulted who recommended surgical decompression of T9-T10, procedure completed in junction with cardiovascular surgery on 4/22 without immediate complications. ID also consulted and recommended IV daptomycin and rifampin. Unfortunately, susceptibilities returned and showed resistance to daptomycin and patient was switched to vancomycin on 5/2. He is recommended to continue antibiotics for 6-8 weeks with the start date restarting on 5/2.  Patient developed new low back pain on 5/4 and new tingling in bilateral heels.  Neurosurgery evaluated the patient and ordered MRI of the lumbar spine which showed mild degenerative changes of lower lumbar spine without significant spinal canal or neural foraminal stenosis.  Pain managed throughout hospitalization and was discharged on the following outpatient regimen: Tylenol, Ibuprofen and oxycodone 10 mg q6h.   Pleural effusion Patient was noted to have a right-sided loculated pleural effusion on CT. He developed slight oxygen requirement with [place]. Underwent thoracotomy with drainage of pleural effusion on 4/29 with placement of chest tubes. Was started on Doxycycline as well given complicated effusion, 2/22-9/7. Anterior chest tube removed on 5/2, remaining tube removed 5/3. Breathing improved and he was stable on RA prior to discharge.   Type 2 Diabetes Mellitus  Patient with difficult to control diabetes. He persistently was hyperglycemic throughout admission.  He was maintained on Lantus, up to 12 units, and sliding scale throughout admission.  He will need outpatient follow-up for diabetes management.  All other issues chronic and stable.    Issues for follow up:  1. Recommend patient gets colonoscopy for cancer screening with unintentional weight  loss 2. 6-8 week total duration of Vancomycin and Rifampin, starting form 01/13/21 3. ESR and CRP weekly  4. Follow up appointment with ID on 5/12 at 9:45AM  6. Continue tapering oxycodone; discharged on 10 mg q6h to take for 7 days. Consider decreasing to 3.5 tablets x7 days and then 3 tablets x7 days, etc.  7. Continue to counsel on limiting NSAIDs use to avoid AKI while he is on Vancomycin, shouldn't take 400 mg more than 4x/daily.  8.  Previously followed with Dr. Valentina Lucks for diabetes management. Hyperglycemic throughout admission. Will need f/u outpatient for management. D/c on Lantus 12U.    Significant Procedures:  T8-11 Lateral and T9-10 corpectomy on 4/22 Right thoracotomy 4/29  Significant Labs and Imaging:  Recent Labs  Lab 01/14/21 0705 01/15/21 0627 01/16/21 0424  WBC 12.7* 10.6* 10.0  HGB 10.2* 9.6* 9.2*  HCT 31.5* 29.9* 28.6*  PLT 808* 780* 809*   Recent Labs  Lab 01/12/21 0610 01/13/21 0100 01/14/21 0705 01/15/21 0627 01/16/21 0424  NA 135 135 134* 133* 136  K 3.8 4.2 4.3 4.2 4.4  CL 97* 100 98 101 100  CO2 '30 28 26 27 28  ' GLUCOSE 130* 214* 165* 138* 141*  BUN '7 12 8 11 13  ' CREATININE 0.67 0.77 0.66 0.69 0.64  CALCIUM 8.0* 8.0* 8.8* 8.4* 8.6*  ALKPHOS 71  --   --   --   --   AST 12*  --   --   --   --   ALT 10  --   --   --   --   ALBUMIN 1.5*  --   --   --   --    CT Thoracic Spine without Contrast 12/27/20 IMPRESSION: 1. Discitis osteomyelitis at T9-10 with surrounding soft tissue inflammatory change and phlegmon, but no abscess. 2. Erosion of the right tenth rib at its junction with the vertebral body, consistent with osteomyelitis. 3. Narrowing of the spinal canal at the T9-10 level is better demonstrated on the MRI.  MR Hip Right with and without Contrast 4/22 IMPRESSION: 1. Persistent edema like signal abnormality and enhancement in the right pubic bone adjacent to the pubic symphysis. I do not see any definite findings for septic arthritis at  the pubic symphysis. 2. Mild inflammation/edema and enhancement in the adductor muscles bilaterally, right greater than left suggesting myositis. 3. No significant intrapelvic abnormalities.  CT Chest Abd Pelv without Contrast 4/24 IMPRESSION: 1. Small bilateral pleural effusions with basilar atelectasis. Right chest tube is in place with minimal residual right apical pneumothorax. Subcutaneous emphysema in the right chest wall. 2. Changes of chronic pancreatitis. 3. No evidence of bowel obstruction or inflammation. Diffusely stool-filled colon. 4. 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  CT Antio Chest PE 4/27 IMPRESSION: 1. No evidence of pulmonary embolus. 2. New moderate-sized loculated right pleural effusion. Interval removal of the right-sided chest tube. Small residual air in the right pleural fluid adjacent to the resected eighth rib at the site of the chest tube insertion. Right lower lobe compressive atelectasis. 3. Small left pleural effusion. 4. Right posterolateral eighth rib resection. Prior T9 corpectomy transfixed with a cage extending from T8 through T10.  Chest 2-View 5/4 IMPRESSION: Chest tube removals without pneumothorax. Central catheter tip in superior vena cava. Persistent right pleural effusion with right mid lung and right base atelectatic change. Left lung clear. Stable cardiac silhouette.  MR Lumbar Spine 5/5 IMPRESSION: Mild degenerative changes of the lower lumbar spine without significant spinal canal or neural foraminal stenosis at any level.  Results/Tests Pending at Time of Discharge: None  Discharge Medications:  Allergies as of 01/17/2021  Reactions   Eggs Or Egg-derived Products Rash   Morphine And Related Other (See Comments)   Cant take because of pancreatitis   [place] Rash      Medication List    STOP taking these medications   acetaminophen 650 MG CR tablet Commonly known as: Tylenol 8  Hour Replaced by: acetaminophen 500 MG tablet   daptomycin  IVPB Commonly known as: CUBICIN   doxycycline 100 MG tablet Commonly known as: VIBRA-TABS   oxyCODONE-acetaminophen 10-325 MG tablet Commonly known as: PERCOCET   Semglee (yfgn) 100 UNIT/ML Sopn Generic drug: Insulin Glargine-yfgn     TAKE these medications   acetaminophen 500 MG tablet Commonly known as: TYLENOL Take 2 tablets (1,000 mg total) by mouth every 6 (six) hours. Replaces: acetaminophen 650 MG CR tablet   Advair Diskus 250-50 MCG/ACT Aepb Generic drug: fluticasone-salmeterol INHALE 1 PUFF INTO THE LUNGS 2 TIMES DAILY. What changed:   how much to take  how to take this  when to take this   albuterol 108 (90 Base) MCG/ACT inhaler Commonly known as: VENTOLIN HFA INHALE 2 TO 4 PUFFS INTO THE LUNGS EVERY 4 HOURS AS NEEDED FOR WHEEZING OR COUGH. What changed:   how much to take  how to take this  when to take this  reasons to take this   ARIPiprazole 5 MG tablet Commonly known as: ABILIFY TAKE 1 TABLET (5 MG TOTAL) BY MOUTH DAILY. What changed:   how much to take  when to take this   atorvastatin 40 MG tablet Commonly known as: LIPITOR Take 1 tablet (40 mg total) by mouth daily.   Baclofen 5 MG Tabs TAKE 1 TABLET BY MOUTH 2 TIMES DAILY AS NEEDED What changed:   how much to take  how to take this  when to take this  reasons to take this   bismuth subsalicylate 196 QI/29NL suspension Commonly known as: PEPTO BISMOL Take 30 mLs by mouth every 6 (six) hours as needed for indigestion or diarrhea or loose stools.   cetirizine 10 MG tablet Commonly known as: ZYRTEC Take 1 tablet (10 mg total) by mouth daily. What changed:   when to take this  reasons to take this   Dexcom G6 Receiver Devi 1 Device by Does not apply route 4 (four) times daily.   Dexcom G6 Transmitter Misc 4 (four) times daily. as directed   DULoxetine 60 MG capsule Commonly known as: CYMBALTA TAKE 1  CAPSULE (60 MG TOTAL) BY MOUTH DAILY. What changed: how much to take   Easy Touch Pen Needles 31G X 5 MM Misc Generic drug: Insulin Pen Needle Inject 1 application as directed 3 (three) times daily.   freestyle lancets Use as instructed   FREESTYLE LITE test strip Generic drug: glucose blood USE AS INSTRUCTED   gabapentin 300 MG capsule Commonly known as: NEURONTIN TAKE 2 CAPSULES (600 MG TOTAL) BY MOUTH 3 (THREE) TIMES DAILY. What changed: how much to take   HumaLOG KwikPen 100 UNIT/ML KwikPen Generic drug: insulin lispro INJECT 6-10 UNITS INTO THE SKIN 3 (THREE) TIMES DAILY. What changed:   how much to take  how to take this  when to take this  additional instructions   ibuprofen 400 MG tablet Commonly known as: ADVIL Take 1 tablet (400 mg total) by mouth every 8 (eight) hours as needed for mild pain.   insulin glargine 100 UNIT/ML injection Commonly known as: LANTUS Inject 0.12 mLs (12 Units total) into the skin at bedtime.   Januvia  100 MG tablet Generic drug: sitaGLIPtin TAKE 1 TABLET (100 MG TOTAL) BY MOUTH DAILY. What changed: how much to take   Jardiance 25 MG Tabs tablet Generic drug: empagliflozin TAKE 1 TABLET BY MOUTH DAILY. What changed: how much to take   lidocaine 5 % Commonly known as: LIDODERM PLACE 1 PATCH ONTO THE SKIN DAILY. REMOVE & DISCARD PATCH WITHIN 12 HOURS OR AS DIRECTED BY MD What changed: how to take this   metFORMIN 1000 MG tablet Commonly known as: GLUCOPHAGE TAKE 1 TABLET (1,000 MG TOTAL) BY MOUTH 2 (TWO) TIMES DAILY WITH A MEAL. What changed: how much to take   mirtazapine 30 MG tablet Commonly known as: REMERON TAKE 1 TABLET (30 MG TOTAL) BY MOUTH AT BEDTIME. What changed:   how much to take  how to take this  when to take this   montelukast 10 MG tablet Commonly known as: SINGULAIR TAKE 1 TABLET (10 MG TOTAL) BY MOUTH AT BEDTIME. What changed:   how much to take  when to take this   ondansetron 4 MG  disintegrating tablet Commonly known as: Zofran ODT Take 1 tablet (4 mg total) by mouth every 8 (eight) hours as needed for nausea or vomiting.   Oxycodone HCl 10 MG Tabs Take 1 tablet (10 mg total) by mouth every 6 (six) hours as needed for up to 7 days for severe pain.   PANCRELIPASE (LIP-PROT-AMYL) PO Take 36,000 Units by mouth with breakfast, with lunch, and with evening meal.   pantoprazole 40 MG tablet Commonly known as: PROTONIX TAKE 1 TABLET (40 MG TOTAL) BY MOUTH 2 (TWO) TIMES DAILY. What changed: how much to take   polyethylene glycol 17 g packet Commonly known as: MIRALAX / GLYCOLAX Take 17 g by mouth daily.   propranolol ER 80 MG 24 hr capsule Commonly known as: INDERAL LA TAKE 1 CAPSULE (80 MG TOTAL) BY MOUTH DAILY. What changed:   how much to take  how to take this  when to take this   rifampin 300 MG capsule Commonly known as: RIFADIN Take 1 capsule (300 mg total) by mouth 2 (two) times daily.   senna-docusate 8.6-50 MG tablet Commonly known as: Senokot-S Take 1 tablet by mouth at bedtime.   vancomycin  IVPB Inject 1,500 mg into the vein every 12 (twelve) hours. Indication:  Disseminated MRSA infection  First Dose: No Last Day of Therapy:  02/25/2021 Labs - "Sunday/Monday:  CBC/D, BMP, and vancomycin trough. Labs - Thursday:  BMP and vancomycin trough Labs - Every other week:  ESR and CRP Method of administration:Elastomeric Method of administration may be changed at the discretion of the patient and/or caregiver's ability to self-administer the medication ordered.   vancomycin HCl 1500 MG/300ML Soln Commonly known as: VANCOREADY Inject 300 mLs (1,500 mg total) into the vein every 12 (twelve) hours.            Durable Medical Equipment  (From admission, onward)         Start     Ordered   01/14/21 1307  For home use only DME Tub bench  Once        01/14/21 1307   01/14/21 1302  For home use only DME 3 n 1  Once        05" /03/22 1301            Discharge Care Instructions  (From admission, onward)         Start     Ordered  01/17/21 0000  Change dressing on IV access line weekly and PRN  (Home infusion instructions - [facility] )        01/17/21 1117   01/17/21 0000  Discharge wound care:       Comments: Per surgery recommendations   01/17/21 1117          Discharge Instructions: Please refer to Patient Instructions section of EMR for full details.  Patient was counseled important signs and symptoms that should prompt return to medical care, changes in medications, dietary instructions, activity restrictions, and follow up appointments.   Follow-Up Appointments:  Follow-up Information    [facility], [facility] Follow up.   Specialty: Home Health Services Why: HHRN/PT/OT arranged (IV abx to be coordinated with [facility]) Contact information: [place] [address] [phone]        [facility], [facility] Follow up.   Why: 3n1 and tub bench arranged- to be delivered to room prior to discharge Contact information: [address] [place] [address] [phone]        Gaye Pollack, MD. Go on 01/29/2021.   Specialty: Cardiothoracic Surgery Why: Your appointment is at [place].  Please arrive 30 min early for a chest x-ray to be performed by [facility] located on the first floor of the same building.  Contact information: [address] [place] [address] [phone]        Mina Marble P, DO Follow up on 01/20/2021.   Specialty: Family Medicine Why: You have an appointment with Dr. Tarry Kos at 3:30 PM. Contact information: [address] [place] [address] [place], [employer], DO 01/17/2021, 11:22 AM PGY-1, [place]

## 2021-01-17 ENCOUNTER — Other Ambulatory Visit (HOSPITAL_COMMUNITY): Payer: Self-pay

## 2021-01-17 DIAGNOSIS — A4902 Methicillin resistant Staphylococcus aureus infection, unspecified site: Secondary | ICD-10-CM

## 2021-01-17 DIAGNOSIS — E1140 Type 2 diabetes mellitus with diabetic neuropathy, unspecified: Secondary | ICD-10-CM | POA: Diagnosis not present

## 2021-01-17 DIAGNOSIS — Z794 Long term (current) use of insulin: Secondary | ICD-10-CM | POA: Diagnosis not present

## 2021-01-17 DIAGNOSIS — L02611 Cutaneous abscess of right foot: Secondary | ICD-10-CM | POA: Diagnosis not present

## 2021-01-17 DIAGNOSIS — A327 Listerial sepsis: Secondary | ICD-10-CM | POA: Diagnosis not present

## 2021-01-17 DIAGNOSIS — S98011A Complete traumatic amputation of right foot at ankle level, initial encounter: Secondary | ICD-10-CM | POA: Diagnosis not present

## 2021-01-17 DIAGNOSIS — Z9889 Other specified postprocedural states: Secondary | ICD-10-CM | POA: Diagnosis not present

## 2021-01-17 LAB — GLUCOSE, CAPILLARY
Glucose-Capillary: 157 mg/dL — ABNORMAL HIGH (ref 70–99)
Glucose-Capillary: 153 mg/dL — ABNORMAL HIGH (ref 70–99)

## 2021-01-17 MED ORDER — OXYCODONE HCL 10 MG PO TABS
10.0000 mg | ORAL_TABLET | Freq: Four times a day (QID) | ORAL | 0 refills | Status: AC | PRN
  Filled 2021-01-17: qty 28, 7d supply, fill #0

## 2021-01-17 MED ORDER — HEPARIN SOD (PORK) LOCK FLUSH 100 UNIT/ML IV SOLN
250.0000 [IU] | INTRAVENOUS | Status: AC | PRN
  Administered 2021-01-17: 250 [IU]
  Filled 2021-01-17: qty 2.5

## 2021-01-17 MED ORDER — ACETAMINOPHEN 500 MG PO TABS
1000.0000 mg | ORAL_TABLET | Freq: Four times a day (QID) | ORAL | 0 refills | Status: DC
  Filled 2021-01-17: qty 30, 4d supply, fill #0

## 2021-01-17 MED ORDER — VANCOMYCIN HCL 1500 MG/300ML IV SOLN
1500.0000 mg | Freq: Two times a day (BID) | INTRAVENOUS | 0 refills | Status: AC
  Filled 2021-01-17: qty 30000, 50d supply, fill #0

## 2021-01-17 MED ORDER — INSULIN GLARGINE 100 UNIT/ML ~~LOC~~ SOLN
12.0000 [IU] | Freq: Every day | SUBCUTANEOUS | 11 refills | Status: DC
Start: 2021-01-17 — End: 2021-02-06
  Filled 2021-01-17: qty 10, 28d supply, fill #0

## 2021-01-17 MED ORDER — ONDANSETRON 4 MG PO TBDP
4.0000 mg | ORAL_TABLET | Freq: Once | ORAL | Status: AC
  Administered 2021-01-17: 4 mg via ORAL
  Filled 2021-01-17: qty 1

## 2021-01-17 MED ORDER — VANCOMYCIN IV (FOR PTA / DISCHARGE USE ONLY): 1500.0000 mg | Freq: Two times a day (BID) | INTRAVENOUS | 0 refills | Status: AC

## 2021-01-17 MED ORDER — RIFAMPIN 300 MG PO CAPS
300.0000 mg | ORAL_CAPSULE | Freq: Two times a day (BID) | ORAL | 0 refills | Status: DC
  Filled 2021-01-17: qty 60, 30d supply, fill #0

## 2021-01-17 MED ORDER — IBUPROFEN 400 MG PO TABS
400.0000 mg | ORAL_TABLET | Freq: Three times a day (TID) | ORAL | 0 refills | Status: DC | PRN
  Filled 2021-01-17: qty 60, 20d supply, fill #0

## 2021-01-17 MED ORDER — ONDANSETRON 4 MG PO TBDP
4.0000 mg | ORAL_TABLET | Freq: Three times a day (TID) | ORAL | 0 refills | Status: DC | PRN
  Filled 2021-01-17: qty 20, 7d supply, fill #0

## 2021-01-17 MED ORDER — POLYETHYLENE GLYCOL 3350 17 GM/SCOOP PO POWD
17.0000 g | Freq: Every day | ORAL | 0 refills | Status: DC
  Filled 2021-01-17: qty 510, 30d supply, fill #0

## 2021-01-17 MED ORDER — SENNOSIDES-DOCUSATE SODIUM 8.6-50 MG PO TABS
1.0000 | ORAL_TABLET | Freq: Every day | ORAL | 0 refills | Status: DC
  Filled 2021-01-17: qty 30, 30d supply, fill #0

## 2021-01-17 NOTE — Progress Notes (Signed)
Pt discharged to home with wife/  Pt take off telemetry and CCMD notified.  PT's PICC line flushed and capped by IV team.  Pt left with all of their personal belongings.  Pt left with 3-in-1 and tub seat.  Pt left with TOC pharmacy medications.  AVS documentation reviewed and sent home with Pt and all question answered.

## 2021-01-17 NOTE — Progress Notes (Signed)
Patient ID: Christopher Fernandez, male   DOB: Nov 12, 1970, 50 y.o.   MRN: [id number] Vital signs are stable I reviewed the MRI of the lumbar spine which demonstrates that there is no significant pathology in the lumbar spine He has typical degenerative changes certainly nothing to be too alarmed about and nothing that I believe is creating the numbness he is experiencing in his heel Continue supportive care and encourage ambulation

## 2021-01-17 NOTE — Discharge Instructions (Signed)
You were hospitalized at [facility] due to a new infection in your back. You had surgery of your back to help clear the infection and you are on IV antibiotics which should continue for the next 6-8 weeks. You will have home health PT and RN support to help you. You should follow up with infectious disease outpatient. We are so glad you are feeling better. Be sure to follow-up with your regularly scheduled appointments.  Please also be sure to follow-up with your PCP as scheduled. For pain control, continue to take Tylenol primarily for pain. Try to limit the amount of Ibuprofen or other NSAIDs that you take as this could increase your risk of gastrointestinal bleeding and potentially harm your kidneys while you are on the antibiotics (Vancomycin). Do not take more than 400 mg of Ibuprofen 4 times a day, and be sure to take with food. Additionally for pain, we will give you some Oxycodone. Take four times a day (every 6 hours)  for the next week. This will need to be slowly tapered outpatient by your PCP as you are at risk for withdrawal given your long-term use. We also gave you some Zofran to take if you are feeling nauseous. Thank you for allowing [place] to take care of you.  Take care, [facility] family medicine team

## 2021-01-19 NOTE — Progress Notes (Signed)
Subjective:   Patient ID: Christopher Fernandez    DOB: [birth year], 50 y.o. male   MRN: [id number]  Christopher Fernandez is a 50 y.o. male with a history of hypertension, pericardial effusion, asthma, COPD, pleural effusion, chronic pancreatitis, history of choledocholithiasis, exocrine pancreatic insufficiency, GERD, history of diabetic foot infection with osteomyelitis status post multiple here toe amputation, type 2 diabetes with neuropathy, hyperlipidemia, osteomyelitis of thoracolumbar region/pelvic region and epidural abscess, BPH, abnormal ABI, action tremor, history of alcohol use disorder, anxiety/depression, asplenia, constipation, ED, history of MRSA, RLS here for hospital follow up  Hospital follow up 2/2 Epidural Abscess/Phlegmon: Patient hospitalized from 4/15-5/6 for continued osteomyelitis with new abscess/phlegmon status post T8-11 lateral fusion and T9-10 corpectomy.  He also noted to have right loculated pleural effusion status postthoracotomy.  He tolerated these procedures well.  Infectious disease was consulted and recommended IV daptomycin and rifampin.  Unfortunately susceptibilities returned and showed resistance to daptomycin thus patient was switched to vancomycin on 5/2.  Per ID, patient is to complete a 6 to 8-week total duration of vancomycin and rifampin starting from 01/13/2021.  Pain has been controlled with Tylenol, ibuprofen, oxycodone 10 mg every 6 hours.  Plan is to taper off oxycodone. Encouraged to avoid excessive NSAIDs (No more than 450m daily) to avoid AKI while on vanc. He notes pain was well controlled with Oxy 174m Would like to transition to Percocet 26m526m [facility]. Waiting for appointment. He has follow-up with ID on 5/12.  Pleural Effusion: During admission he was noted to have a right-sided loculated pleural effusion on CT and developed slight oxygen requirement with nasal cannula.  He  underwent thoracotomy with drainage of pleural effusion on 4/29 with placement of chest tubes.  He was treated with doxycycline given complicated effusion from 4/27 to 5/1.  Anterior chest tube was removed on 5/2 and remaining tube removed 5/3.  He continued to improve daily and was on room air prior to discharge. Today he notes breathing well without difficulty.  Some shortness of breath with exertion.  Review of Systems:  Per HPI.   Objective:   BP 106/73   Pulse 94   Ht _0  (1.905 m)   Wt 226 lb 3.2 oz (102.6 kg)   SpO2 99%   BMI 28.27 kg/m  Vitals and nursing note reviewed.  General: pleasant older male, appears thinner than previous visit, sitting comfortably in exam chair, in no acute distress with non-toxic appearance CV: regular rate and rhythm without murmurs, rubs, or gallops Lungs: good air flow throughout all lung fields with normal work of breathing on room air, speaking in full sentences Skin: warm, dry, scar along right mid-upper back well healing without any surrounding erythema or warmth, no discharge or dehiscence Extremities: warm and well perfused, normal tone MSK: walks with cane Neuro: Alert and oriented, speech normal  Assessment & Plan:   Epidural abscess Improving.Wounds are well healing along right upper back. Pain well controlled with Oxy 30m18mlan to transition to Percocet 26mg 54mn current rx is finished with plan to slowly taper off. Currently on Vancomycin with follow up with ID on 5/12. Tolerating well. ESR/CRP obtained today for monitoring per ID request. Follow up with Dr. BartlCyndia BentS [place]/18.   Osteomyelitis of second toe of left foot (HCC) S/p amputation. Well healing.   Pleural effusion Resolved. S/p chest tube placement with removal on 5/3. Surgical site well  appearing and healing well. Good lung sounds throughout. Breathing comfortably on room air without hypoxia.   Type 2 diabetes mellitus with diabetic neuropathy, with long-term current  use of insulin (HCC) Chronic, uncontrolled with hyperglycemia during hospitalization. Continues to lose weight. Discharged on Humalog 6-10U TID, Lantus 12U qHS, Januvia 140m QD, Jardiance 226mQD, Metformin 100016mID. Plan for follow up on 02/12/21 to further discuss diabetes management. Recommended to monitor blood sugars and keep a log to review.  Orders Placed This Encounter  Procedures  . Sed Rate (ESR)  . CRP (C-Reactive Protein)   Meds ordered this encounter  Medications  . baclofen (LIORESAL) 10 MG tablet    Sig: Take 1 tablet (10 mg total) by mouth 3 (three) times daily as needed for muscle spasms.    Dispense:  60 each    Refill:  0     KieMina MarbleO PGY-3, [facility] 01/22/2021 10:27 AM

## 2021-01-20 ENCOUNTER — Other Ambulatory Visit: Payer: Self-pay

## 2021-01-20 ENCOUNTER — Other Ambulatory Visit (HOSPITAL_COMMUNITY): Payer: Self-pay

## 2021-01-20 ENCOUNTER — Encounter: Payer: Self-pay | Admitting: Family Medicine

## 2021-01-20 ENCOUNTER — Ambulatory Visit: Payer: 59 | Admitting: Family Medicine

## 2021-01-20 ENCOUNTER — Telehealth: Payer: Self-pay

## 2021-01-20 VITALS — BP 106/73 | HR 94 | Ht 75.0 in | Wt 226.2 lb

## 2021-01-20 DIAGNOSIS — E1169 Type 2 diabetes mellitus with other specified complication: Secondary | ICD-10-CM | POA: Diagnosis not present

## 2021-01-20 DIAGNOSIS — J90 Pleural effusion, not elsewhere classified: Secondary | ICD-10-CM

## 2021-01-20 DIAGNOSIS — G062 Extradural and subdural abscess, unspecified: Secondary | ICD-10-CM | POA: Diagnosis not present

## 2021-01-20 DIAGNOSIS — M4624 Osteomyelitis of vertebra, thoracic region: Secondary | ICD-10-CM | POA: Diagnosis not present

## 2021-01-20 DIAGNOSIS — Z794 Long term (current) use of insulin: Secondary | ICD-10-CM | POA: Diagnosis not present

## 2021-01-20 DIAGNOSIS — M86151 Other acute osteomyelitis, right femur: Secondary | ICD-10-CM | POA: Diagnosis not present

## 2021-01-20 DIAGNOSIS — E1140 Type 2 diabetes mellitus with diabetic neuropathy, unspecified: Secondary | ICD-10-CM

## 2021-01-20 DIAGNOSIS — M869 Osteomyelitis, unspecified: Secondary | ICD-10-CM | POA: Diagnosis not present

## 2021-01-20 DIAGNOSIS — Z09 Encounter for follow-up examination after completed treatment for conditions other than malignant neoplasm: Secondary | ICD-10-CM | POA: Diagnosis not present

## 2021-01-20 DIAGNOSIS — E785 Hyperlipidemia, unspecified: Secondary | ICD-10-CM | POA: Diagnosis not present

## 2021-01-20 DIAGNOSIS — M4644 Discitis, unspecified, thoracic region: Secondary | ICD-10-CM | POA: Diagnosis not present

## 2021-01-20 MED ORDER — BACLOFEN 10 MG PO TABS
10.0000 mg | ORAL_TABLET | Freq: Three times a day (TID) | ORAL | 0 refills | Status: DC | PRN
  Filled 2021-01-20: qty 60, 20d supply, fill #0

## 2021-01-20 NOTE — Telephone Encounter (Signed)
Patient calls nurse line stating he has a hospital FU apt this afternoon at 3:30p. However, patients wife tested positive for covid yesterday. Patient reports he also tested, but he was negative. Patient has no symptoms, he only tested because she did. Patient has had 3 Pfizer vaccines. Should the patient reschedule or is he ok to come in? Please advise.

## 2021-01-20 NOTE — Telephone Encounter (Signed)
Given need to see patient for follow up, patient was seen in office.

## 2021-01-21 ENCOUNTER — Other Ambulatory Visit: Payer: Self-pay | Admitting: *Deleted

## 2021-01-21 ENCOUNTER — Encounter: Payer: Self-pay | Admitting: *Deleted

## 2021-01-21 DIAGNOSIS — E785 Hyperlipidemia, unspecified: Secondary | ICD-10-CM | POA: Diagnosis not present

## 2021-01-21 DIAGNOSIS — M4624 Osteomyelitis of vertebra, thoracic region: Secondary | ICD-10-CM | POA: Diagnosis not present

## 2021-01-21 DIAGNOSIS — M86151 Other acute osteomyelitis, right femur: Secondary | ICD-10-CM | POA: Diagnosis not present

## 2021-01-21 DIAGNOSIS — E1169 Type 2 diabetes mellitus with other specified complication: Secondary | ICD-10-CM | POA: Diagnosis not present

## 2021-01-21 DIAGNOSIS — M4644 Discitis, unspecified, thoracic region: Secondary | ICD-10-CM | POA: Diagnosis not present

## 2021-01-21 NOTE — Patient Outreach (Signed)
[facility]) Care Management  01/21/2021  MYAN SUIT 09/07/1971 [id number]   Transition of care call  Referral received: 01/17/21 Initial outreach:01/21/21 Insurance: [place] [employer]    Subjective: Initial successful telephone call to patient's preferred number in order to complete transition of care assessment; 2 HIPAA identifiers verified. Explained purpose of call and completed transition of care assessment.  Mr.Tieken states that generally this is a good day, energy level, appetite is stronger today that it has been and food tasted good. He says surgical incisions are unremarkable dressing in place site care provided by home health RN. He denies signs symptoms of infection, no fever. He reports surgical area pain is controlled with prn medication he report having lower back pain that is diminished in the last week. He discussed pain management plan recommended by PCP at visit.  He reports tolerating mobility in home using a cane, states getting better with balance discussed working on getting strength back due to recent weight loss and hospital stay. He reports breathing is okay getting stronger, he does have incentive spirometer reinforced use a least every 2 hours during the day. He discussed wife recently testing positive for Covid 19 on Sunday, he has tested negative, reinforced frequent handwashing , wearing mask. He report home health physical therapy will plan call on Friday regarding initial home visit, home health RN visited on today.  He denies bowel or bladder problems.  He discussed continuing to monitor blood sugars at home with fasting level his morning being a little higher at 164 states readings usually have better than that. He anticipate wife to assist with his recovery.   Reviewed accessing the following [place] [employer] : He discussed missing scheduled appointment with the Active health management coach during hospital stay,reporting having contact number  and plans to return call to reschedule telephone visit .   He does  not have the hospital indemnity He uses a [facility] outpatient pharmacy at [facility] outpatient pharmacy.      Objective:  Mr.Karthikeya Kamau  was hospitalized at [facility] 4/15-01/17/21 for Osteomyelitis with new abscess/phlegmon status post T8-11 lateral fusion and T9-10 corpectomy Right loculated pleural effusion status post thoracotomy, Osteomyelitis right hip/pubis   Comorbidities include: Type 2 Diabetes, peripheral neuropathy , chronic pancreatitis/pancreatic insufficiency, hyperlipidemia hypertension , amputation left first toe, right metatarsal amputation.  He was discharged to home on 01/17/21 with [facility] RN, PT/OT, [facility] IV antibiotic  home infusion  and with DME of Tub bench and 3 in 1.    Assessment:  Patient voices good understanding of all discharge instructions.  See transition of care flowsheet for assessment details.   Plan:  Reviewed hospital discharge diagnosis ofOsteomyelitis with new abscess/phlegmon status post T8-11 lateral fusion and T9-10 corpectomy Right loculated pleural effusion status post thoracotomy    and discharge treatment plan using hospital discharge instructions, assessing medication adherence, reviewing problems requiring provider notification, and discussing the importance of follow up with surgeon, primary care provider and/or specialists as directed.  Reviewed [place] healthy lifestyle program information to receive discounted premium for  2023   Step 1: Get  your annual physical  Step 2: Complete your health assessment  Step 3:Identify your current health status and complete the corresponding action step between September 14, 2020 and May 15, 2021.        Patient agreeable to follow up transition of call in the next week assess for additional care coordination needs and support.  Will route  successful outreach letter with [place] [facility]  pamphlet and 24 Hour Nurse Line Magnet to [place] [facility] clinical pool to be mailed to patient's home address.    Joylene Draft, RN, BSN  [place] Management Coordinator  [phone]- Mobile [phone]- Toll Free Main Office

## 2021-01-21 NOTE — Assessment & Plan Note (Addendum)
Improving.Wounds are well healing along right upper back. Pain well controlled with Oxy 73m. Plan to transition to Percocet 525mwhen current rx is finished with plan to slowly taper off. Currently on Vancomycin with follow up with ID on 5/12. Tolerating well. ESR/CRP obtained today for monitoring per ID request. Follow up with Dr. BaCyndia BentCVBonnern 5/18.

## 2021-01-22 ENCOUNTER — Other Ambulatory Visit (HOSPITAL_COMMUNITY): Payer: Self-pay

## 2021-01-22 DIAGNOSIS — M86151 Other acute osteomyelitis, right femur: Secondary | ICD-10-CM | POA: Diagnosis not present

## 2021-01-22 DIAGNOSIS — L02611 Cutaneous abscess of right foot: Secondary | ICD-10-CM | POA: Diagnosis not present

## 2021-01-22 DIAGNOSIS — M4644 Discitis, unspecified, thoracic region: Secondary | ICD-10-CM | POA: Diagnosis not present

## 2021-01-22 DIAGNOSIS — E1169 Type 2 diabetes mellitus with other specified complication: Secondary | ICD-10-CM | POA: Diagnosis not present

## 2021-01-22 DIAGNOSIS — M4624 Osteomyelitis of vertebra, thoracic region: Secondary | ICD-10-CM | POA: Diagnosis not present

## 2021-01-22 DIAGNOSIS — E785 Hyperlipidemia, unspecified: Secondary | ICD-10-CM | POA: Diagnosis not present

## 2021-01-22 DIAGNOSIS — S98011A Complete traumatic amputation of right foot at ankle level, initial encounter: Secondary | ICD-10-CM | POA: Diagnosis not present

## 2021-01-22 NOTE — Assessment & Plan Note (Signed)
S/p amputation. Well healing.

## 2021-01-22 NOTE — Assessment & Plan Note (Signed)
Chronic, uncontrolled with hyperglycemia during hospitalization. Continues to lose weight. Discharged on Humalog 6-10U TID, Lantus 12U qHS, Januvia 100mg  QD, Jardiance 25mg  QD, Metformin 1000mg  BID. Plan for follow up on 02/12/21 to further discuss diabetes management. Recommended to monitor blood sugars and keep a log to review.

## 2021-01-22 NOTE — Assessment & Plan Note (Signed)
Resolved. S/p chest tube placement with removal on 5/3. Surgical site well appearing and healing well. Good lung sounds throughout. Breathing comfortably on room air without hypoxia.

## 2021-01-23 ENCOUNTER — Ambulatory Visit: Payer: 59 | Admitting: Family Medicine

## 2021-01-23 ENCOUNTER — Other Ambulatory Visit: Payer: Self-pay

## 2021-01-23 ENCOUNTER — Telehealth (INDEPENDENT_AMBULATORY_CARE_PROVIDER_SITE_OTHER): Payer: 59 | Admitting: Infectious Disease

## 2021-01-23 ENCOUNTER — Other Ambulatory Visit: Payer: Self-pay | Admitting: *Deleted

## 2021-01-23 ENCOUNTER — Ambulatory Visit (HOSPITAL_COMMUNITY): Admit: 2021-01-23 | Payer: 59 | Admitting: Gastroenterology

## 2021-01-23 ENCOUNTER — Encounter (HOSPITAL_COMMUNITY): Payer: Self-pay

## 2021-01-23 ENCOUNTER — Telehealth: Payer: Self-pay

## 2021-01-23 ENCOUNTER — Other Ambulatory Visit: Payer: Self-pay | Admitting: Infectious Diseases

## 2021-01-23 DIAGNOSIS — J90 Pleural effusion, not elsewhere classified: Secondary | ICD-10-CM

## 2021-01-23 DIAGNOSIS — A327 Listerial sepsis: Secondary | ICD-10-CM | POA: Diagnosis not present

## 2021-01-23 DIAGNOSIS — M4645 Discitis, unspecified, thoracolumbar region: Secondary | ICD-10-CM

## 2021-01-23 DIAGNOSIS — G062 Extradural and subdural abscess, unspecified: Secondary | ICD-10-CM

## 2021-01-23 DIAGNOSIS — M8609 Acute hematogenous osteomyelitis, multiple sites: Secondary | ICD-10-CM

## 2021-01-23 DIAGNOSIS — A4902 Methicillin resistant Staphylococcus aureus infection, unspecified site: Secondary | ICD-10-CM | POA: Diagnosis not present

## 2021-01-23 DIAGNOSIS — Z9889 Other specified postprocedural states: Secondary | ICD-10-CM

## 2021-01-23 DIAGNOSIS — E1169 Type 2 diabetes mellitus with other specified complication: Secondary | ICD-10-CM | POA: Diagnosis not present

## 2021-01-23 DIAGNOSIS — E11628 Type 2 diabetes mellitus with other skin complications: Secondary | ICD-10-CM

## 2021-01-23 DIAGNOSIS — Q8901 Asplenia (congenital): Secondary | ICD-10-CM

## 2021-01-23 DIAGNOSIS — M869 Osteomyelitis, unspecified: Secondary | ICD-10-CM | POA: Diagnosis not present

## 2021-01-23 DIAGNOSIS — S98011A Complete traumatic amputation of right foot at ankle level, initial encounter: Secondary | ICD-10-CM | POA: Diagnosis not present

## 2021-01-23 DIAGNOSIS — M60003 Infective myositis, unspecified right leg: Secondary | ICD-10-CM | POA: Diagnosis not present

## 2021-01-23 DIAGNOSIS — M4620 Osteomyelitis of vertebra, site unspecified: Secondary | ICD-10-CM | POA: Diagnosis not present

## 2021-01-23 DIAGNOSIS — Z794 Long term (current) use of insulin: Secondary | ICD-10-CM

## 2021-01-23 DIAGNOSIS — M4624 Osteomyelitis of vertebra, thoracic region: Secondary | ICD-10-CM | POA: Diagnosis not present

## 2021-01-23 DIAGNOSIS — E1140 Type 2 diabetes mellitus with diabetic neuropathy, unspecified: Secondary | ICD-10-CM

## 2021-01-23 DIAGNOSIS — N3289 Other specified disorders of bladder: Secondary | ICD-10-CM

## 2021-01-23 DIAGNOSIS — M86151 Other acute osteomyelitis, right femur: Secondary | ICD-10-CM | POA: Diagnosis not present

## 2021-01-23 DIAGNOSIS — M4644 Discitis, unspecified, thoracic region: Secondary | ICD-10-CM | POA: Diagnosis not present

## 2021-01-23 DIAGNOSIS — L089 Local infection of the skin and subcutaneous tissue, unspecified: Secondary | ICD-10-CM

## 2021-01-23 DIAGNOSIS — L02611 Cutaneous abscess of right foot: Secondary | ICD-10-CM | POA: Diagnosis not present

## 2021-01-23 DIAGNOSIS — Z89431 Acquired absence of right foot: Secondary | ICD-10-CM

## 2021-01-23 DIAGNOSIS — R339 Retention of urine, unspecified: Secondary | ICD-10-CM

## 2021-01-23 DIAGNOSIS — Z20822 Contact with and (suspected) exposure to covid-19: Secondary | ICD-10-CM

## 2021-01-23 DIAGNOSIS — E785 Hyperlipidemia, unspecified: Secondary | ICD-10-CM | POA: Diagnosis not present

## 2021-01-23 SURGERY — ENDOSCOPIC RETROGRADE CHOLANGIOPANCREATOGRAPHY (ERCP) WITH PROPOFOL
Anesthesia: General

## 2021-01-23 NOTE — Telephone Encounter (Signed)
Patient calls nurse line stating he was told to call today to remind PCP to send in Oxycodone 5mg . Will forward to PCP.   [place].

## 2021-01-23 NOTE — Patient Outreach (Signed)
[facility]) Care Management  01/23/2021  WYNN ALLDREDGE August 04, 1971 [id number]   Monthly telephonic UMR case management review with [facility] RNCMs and Dr Alonza Bogus. Update provided to include:  Medical Conditions Update: Mr.Giankarlo Ashurst was hospitalized [facility] 4/15-01/17/21 for Osteomyelitis with new abscess/phlegmon status post T8-11 lateral fusion and T9-10 corpectomy Right loculated pleural effusion status post thoracotomy, Osteomyelitis right hip/pubis   Comorbidities include: Type 2 Diabetes, peripheral neuropathy , chronic pancreatitis/pancreatic insufficiency, hyperlipidemia hypertension , amputation left first toe, right metatarsal amputation.  He was discharged to home on 01/17/21 with [facility] services RN, PT/OT, [facility] IV antibiotic  home infusion  and with DME of Tub bench and 3 in 1.   Discussed transition of care outreach and follow up plans, engage with active health management .   Plan Will continue follow up with outreach transition of call follow call scheduled in next week   Joylene Draft, RN, BSN  [place] Management Coordinator  [phone]- Mobile [phone]- [place]

## 2021-01-23 NOTE — Progress Notes (Signed)
Virtual Visit via Telephone Note  I connected with Christopher Fernandez on 01/23/21 at  9:45 AM EDT by telephone and verified that I am speaking with the correct person using two identifiers.  Location: Patient: Seated in his car (not moving) Provider: [facility]   I discussed the limitations, risks, security and privacy concerns of performing an evaluation and management service by telephone and the availability of in person appointments. I also discussed with the patient that there may be a patient responsible charge related to this service. The patient expressed understanding and agreed to proceed.   History of Present Illness:  50 y.o. 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  He ultimately went to the operating room on April 22 and underwent vertebrectomy, and decompression of his epidural abscess with hardware reconstruction joint neurosurgical and cardiothoracic surgical involvement.  He then had to go back to the operating room on the 29th for redo thoracotomy due to an infected empyema.  Chest tubes were placed.  Unfortunately his MRSA isolate from the operating room was now found to be daptomycin resistant though still sensitive to vancomycin he was switched over to vancomycin along with rifampin given the hardware.  Chest tubes were removed towards the end of his hospital stay.  He is continued on antibiotics with plans for stop date on June 14.  His back pain has improved dramatically postoperatively.  He still has some dyspnea but this has improved the last couple  of days.  He also has noted some worsening pain in his right leg and hip.  Of note previously had been found to have osteomyelitis of the pubic symphysis as well as . Inflammation enhancement in the adductor muscles bilateral right greater than left  He is also had several other issues.  He has noted when he goes to the bathroom he will experience some urgency and also when he is trying to empty his bladder it empties incompletely or in spurts.  He was wondering whether this might be related to a urinary tract infection I think is more likely related to potential enlarged prostate or potential urinary retention in the context of opiate use to treat his significant pain from his metastatic infection and surgery.  Additionally his wife tested positive for COVID-19 this past Sunday.  He himself tested negative then but has not been tested today.  His wife has had symptomatic improvement having been given oral antiviral therapy.  She still though is fatigued and has malaise.  They have tried to stay away to 1 another and the house is much as possible but they were in the same car when I talked to him.  Because of his wife's COVID positivity I felt that he should conduct the visit today through a video visit rather than in person.  I have counseled him to purchase additional rapid antigen test so that should he have appointments to go to we could not test himself that day at minimum.  I would still recommend quarantine for 10 days from date of last exposure to his wife while she was sick though if has been 5 days needs tested negative and that will  be reasonable as well.  I think is prudent for him to self test on the day that he is going to go to an appointment as he has some appointments coming up including the 18th with Dr. Alycia Rossetti.  He also tells me that the PICC line is no longer capable of having blood pulled through it and he has had trouble having tPA covered by the insurance he paid  out-of-pocket for it finally and has been shipped to his house is post to be administered today.      Past Medical History:  Diagnosis Date  . Alcoholism ([place])   . Anemia   . Anxiety   . Arthritis   . Asthma   . Blood transfusion without reported diagnosis   . COPD (chronic obstructive pulmonary disease) ([place])   . Depression   . Diabetes ([place])    type 2  . GERD (gastroesophageal reflux disease)   . History of hiatal hernia   . HTN (hypertension)   . MRSA bacteremia   . MRSA infection 01/11/2020  . Neuromuscular disorder ([facility])    tremors  . Osteomyelitis ([place])    right forefoot  . Pancreatitis   . Pneumonia   . Substance abuse ([place])   . 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 Wears glasses     Past Surgical History:  Procedure Laterality Date  . 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 AMPUTATION Right 10/18/2019   Procedure: RIGHT SECOND TOE AMPUTATION;  Surgeon: Newt Minion, MD;  Location: [place];  Service: Orthopedics;  Laterality: Right;  . AMPUTATION Right 11/08/2019   Procedure: RIGHT TRANSMETATARSAL AMPUTATION;  Surgeon: Newt Minion, MD;  Location: [place];  Service: Orthopedics;  Laterality: Right;  . AMPUTATION Left 12/02/2020   Procedure: AMPUTATION 2ND TOE;  Surgeon: Elam Dutch, MD;  Location: [place];  Service: Vascular;  Laterality: Left;  . APPLICATION OF WOUND VAC Right 01/10/2020   Procedure: APPLICATION OF WOUND VAC, right foot;  Surgeon: [name], MD;  Location: [place];  Service: Vascular;  Laterality: Right;  . BILIARY DILATION  12/06/2020   Procedure: BILIARY DILATION;  Surgeon: Rush Landmark Telford Nab., MD;  Location: [place];  Service: Gastroenterology;;  . BIOPSY  12/06/2020   Procedure: BIOPSY;  Surgeon: Irving Copas., MD;  Location: [facility] ENDOSCOPY;  Service: Gastroenterology;;  . Lorin Mercy  2006   with gallbladder and spleen  . COLONOSCOPY  8-10 years ago    in [place] exam per pt  .  ENDOSCOPIC RETROGRADE CHOLANGIOPANCREATOGRAPHY (ERCP) WITH PROPOFOL N/A 12/06/2020   Procedure: ENDOSCOPIC RETROGRADE CHOLANGIOPANCREATOGRAPHY (ERCP) WITH PROPOFOL;  Surgeon: Rush Landmark Telford Nab., MD;  Location: [name];  Service: Gastroenterology;  Laterality: N/A;  . HERNIA REPAIR  2008, 2010   hiatal hernia and 1 additional  . NASAL SEPTUM SURGERY    . PANCREATIC PSEUDOCYST DRAINAGE    . PANCREATIC STENT PLACEMENT  12/06/2020   Procedure: PANCREATIC STENT PLACEMENT;  Surgeon: Rush Landmark Telford Nab., MD;  Location: [place];  Service: Gastroenterology;;  . PLEURAL EFFUSION DRAINAGE Right 01/10/2021   Procedure: DRAINAGE OF PLEURAL EFFUSION;  Surgeon: Gaye Pollack, MD;  Location: [place];  Service: Thoracic;  Laterality: Right;  . REMOVAL OF STONES  12/06/2020   Procedure: REMOVAL OF STONES;  Surgeon: Irving Copas., MD;  Location: [place];  Service: Gastroenterology;;  . [name]  12/06/2020   Procedure: [name];  Surgeon: Irving Copas., MD;  Location: [place];  Service: Gastroenterology;;  . Delfina Redwood  2006  . STUMP REVISION Right 11/24/2019   Procedure: REVISION RIGHT TRANSMETATARSAL AMPUTATION;  Surgeon: Newt Minion, MD;  Location: [place];  Service: Orthopedics;  Laterality: Right;  . TEE WITHOUT CARDIOVERSION N/A 12/04/2020   Procedure: TRANSESOPHAGEAL ECHOCARDIOGRAM (TEE);  Surgeon: Acie Fredrickson Wonda Cheng, MD;  Location: [facility] ENDOSCOPY;  Service: Cardiovascular;  Laterality: N/A;  . THORACIC EXPOSURE Right 01/03/2021   Procedure: THORACIC EXPOSURE with removal of rib;  Surgeon: Gaye Pollack, MD;  Location: [place];  Service: Cardiothoracic;  Laterality: Right;  . THORACOTOMY Right 01/10/2021   Procedure: REDO THORACOTOMY;  Surgeon: Gaye Pollack, MD;  Location: [facility] OR;  Service: Thoracic;  Laterality: Right;  . [place] VERTEBRECTOMY Right 01/03/2021   Procedure: Decompression of  Thoracic nine and Thoracic ten via corpectomy  reconstruction with titanium spacer and rib autogrtaft. Lateral plate fixation Thoracic eight - Thoracic eleven;  Surgeon: Kristeen Miss, MD;  Location: [place];  Service: Neurosurgery;  Laterality: Right;  . TOTAL KNEE ARTHROPLASTY Right 1982, 1984  . TRANSMETATARSAL AMPUTATION Left 09/06/2020   Procedure: AMPUTATION LEFT GREAT TOE;  Surgeon: Waynetta Sandy, MD;  Location: [place];  Service: Vascular;  Laterality: Left;  . WOUND DEBRIDEMENT Right 01/10/2020   Procedure: incisional DEBRIDEMENT of RIGHT TRANSMETATARSAL WOUND;  Surgeon: [name], MD;  Location: [facility] OR;  Service: Vascular;  Laterality: Right;    Family History  Problem Relation Age of Onset  . Diabetes Mother   . Hypertension Mother   . Hyperlipidemia Mother   . Kidney disease Mother   . Thyroid disease Mother   . Breast cancer Mother        mets  . Lung cancer Mother   . Diabetes Father   . Alcohol abuse Sister   . Sickle cell trait Sister   . Diabetes Brother   . Asthma Brother   . Lung cancer Maternal Grandmother   . Kidney disease Maternal Grandmother   . Liver cancer Maternal Uncle        x 4-5  . Kidney disease Maternal Uncle   . Kidney disease Paternal Uncle   . Colon cancer Neg Hx   . Colon polyps Neg Hx   . Esophageal cancer Neg Hx   . Rectal cancer Neg Hx   . Stomach cancer Neg Hx       Social History   Socioeconomic History  . Marital status: Married    Spouse name: [name]  . Number of children: 1  . Years of education: Not on file  . Highest education level: Not on file  Occupational History  . Occupation: unemployed  Tobacco Use  . Smoking status: Current Every Day Smoker    Packs/day: 0.30    Years: 38.00    Pack years: 11.40    Types: Cigarettes    Start date: 09/14/1982  . Smokeless tobacco: Never Used  Vaping Use  . Vaping Use: Never used  Substance and Sexual Activity  . Alcohol use: Not Currently    Comment: 2-3 per day, none since 2019  . Drug use: Not  Currently  . Sexual activity: Not on file  Other Topics Concern  . Not on file  Social History Narrative   One child, 4 step-kids   Social Determinants of Health   Financial Resource Strain: Not on file  Food Insecurity: Not on file  Transportation Needs: Not on file  Physical Activity: Not on file  Stress: Not on file  Social  Connections: Not on file    Allergies  Allergen Reactions  . Eggs Or Egg-Derived Products Rash  . Morphine And Related Other (See Comments)    Cant take because of pancreatitis  . Cocoa Rash     Current Outpatient Medications:  .  acetaminophen (TYLENOL) 500 MG tablet, Take 2 tablets (1,000 mg total) by mouth every 6 (six) hours., Disp: 30 tablet, Rfl: 0 .  albuterol (VENTOLIN HFA) 108 (90 Base) MCG/ACT inhaler, INHALE 2 TO 4 PUFFS INTO THE LUNGS EVERY 4 HOURS AS NEEDED FOR WHEEZING OR COUGH. (Patient taking differently: Inhale 2-4 puffs into the lungs every 4 (four) hours as needed for wheezing or shortness of breath.), Disp: 18 g, Rfl: 11 .  ARIPiprazole (ABILIFY) 5 MG tablet, TAKE 1 TABLET (5 MG TOTAL) BY MOUTH DAILY. (Patient taking differently: Take 5 mg by mouth every evening.), Disp: 90 tablet, Rfl: 2 .  atorvastatin (LIPITOR) 40 MG tablet, Take 1 tablet (40 mg total) by mouth daily., Disp: 90 tablet, Rfl: 3 .  baclofen (LIORESAL) 10 MG tablet, Take 1 tablet (10 mg total) by mouth 3 (three) times daily as needed for muscle spasms., Disp: 60 each, Rfl: 0 .  bismuth subsalicylate (PEPTO BISMOL) 262 MG/15ML suspension, Take 30 mLs by mouth every 6 (six) hours as needed for indigestion or diarrhea or loose stools., Disp: , Rfl:  .  cetirizine (ZYRTEC) 10 MG tablet, Take 1 tablet (10 mg total) by mouth daily. (Patient taking differently: Take 10 mg by mouth daily as needed for allergies.), Disp: 30 tablet, Rfl: 11 .  Continuous Blood Gluc Receiver (DEXCOM G6 RECEIVER) DEVI, 1 Device by Does not apply route 4 (four) times daily., Disp: 1 each, Rfl: 11 .   Continuous Blood Gluc Transmit (DEXCOM G6 TRANSMITTER) MISC, 4 (four) times daily. as directed, Disp: , Rfl:  .  DULoxetine (CYMBALTA) 60 MG capsule, TAKE 1 CAPSULE (60 MG TOTAL) BY MOUTH DAILY. (Patient taking differently: Take 60 mg by mouth daily.), Disp: 90 capsule, Rfl: 2 .  EASY TOUCH PEN NEEDLES 31G X 5 MM MISC, Inject 1 application as directed 3 (three) times daily., Disp: 100 each, Rfl: 11 .  empagliflozin (JARDIANCE) 25 MG TABS tablet, TAKE 1 TABLET BY MOUTH DAILY. (Patient taking differently: Take 25 mg by mouth daily.), Disp: 90 tablet, Rfl: 3 .  Fluticasone-Salmeterol (ADVAIR) 250-50 MCG/DOSE AEPB, INHALE 1 PUFF INTO THE LUNGS 2 TIMES DAILY. (Patient taking differently: Inhale 1 puff into the lungs in the morning and at bedtime.), Disp: 180 each, Rfl: 3 .  gabapentin (NEURONTIN) 300 MG capsule, TAKE 2 CAPSULES (600 MG TOTAL) BY MOUTH 3 (THREE) TIMES DAILY. (Patient taking differently: Take 600 mg by mouth 3 (three) times daily.), Disp: 180 capsule, Rfl: 2 .  glucose blood test strip, USE AS INSTRUCTED, Disp: 100 strip, Rfl: 12 .  ibuprofen (ADVIL) 400 MG tablet, Take 1 tablet (400 mg total) by mouth every 8 (eight) hours as needed for mild pain., Disp: 60 tablet, Rfl: 0 .  insulin glargine (LANTUS) 100 UNIT/ML injection, Inject 0.12 mLs (12 Units total) into the skin at bedtime., Disp: 10 mL, Rfl: 11 .  insulin lispro (HUMALOG) 100 UNIT/ML KwikPen, INJECT 6-10 UNITS INTO THE SKIN 3 (THREE) TIMES DAILY. (Patient taking differently: Inject 6-10 Units into the skin 3 (three) times daily. Sliding scale not provided), Disp: 15 mL, Rfl: 11 .  Lancets (FREESTYLE) lancets, Use as instructed, Disp: 100 each, Rfl: 12 .  lidocaine (LIDODERM) 5 %, PLACE 1 PATCH  ONTO THE SKIN DAILY. REMOVE & DISCARD PATCH WITHIN 12 HOURS OR AS DIRECTED BY MD (Patient taking differently: Place 1 patch onto the skin daily. Remove & discard patch within 12 hours or as directed by MD), Disp: 30 patch, Rfl: 0 .  metFORMIN  (GLUCOPHAGE) 1000 MG tablet, TAKE 1 TABLET (1,000 MG TOTAL) BY MOUTH 2 (TWO) TIMES DAILY WITH A MEAL. (Patient taking differently: Take 1,000 mg by mouth 2 (two) times daily with a meal.), Disp: 180 tablet, Rfl: 3 .  mirtazapine (REMERON) 30 MG tablet, TAKE 1 TABLET (30 MG TOTAL) BY MOUTH AT BEDTIME. (Patient taking differently: Take 30 mg by mouth at bedtime.), Disp: 90 tablet, Rfl: 3 .  montelukast (SINGULAIR) 10 MG tablet, TAKE 1 TABLET (10 MG TOTAL) BY MOUTH AT BEDTIME. (Patient taking differently: Take 10 mg by mouth in the morning.), Disp: 90 tablet, Rfl: 11 .  ondansetron (ZOFRAN ODT) 4 MG disintegrating tablet, Take 1 tablet (4 mg total) by mouth every 8 (eight) hours as needed for nausea or vomiting., Disp: 20 tablet, Rfl: 0 .  Oxycodone HCl 10 MG TABS, Take 1 tablet (10 mg total) by mouth every 6 (six) hours as needed for up to 7 days for severe pain., Disp: 28 tablet, Rfl: 0 .  PANCRELIPASE, LIP-PROT-AMYL, PO, Take 36,000 Units by mouth with breakfast, with lunch, and with evening meal., Disp: , Rfl:  .  pantoprazole (PROTONIX) 40 MG tablet, TAKE 1 TABLET (40 MG TOTAL) BY MOUTH 2 (TWO) TIMES DAILY. (Patient taking differently: Take 40 mg by mouth 2 (two) times daily.), Disp: 60 tablet, Rfl: 0 .  polyethylene glycol powder (GLYCOLAX/MIRALAX) 17 GM/SCOOP powder, Dissolve 1 scoopful (17 g) in water and drink daily., Disp: 510 g, Rfl: 0 .  propranolol ER (INDERAL LA) 80 MG 24 hr capsule, TAKE 1 CAPSULE (80 MG TOTAL) BY MOUTH DAILY. (Patient taking differently: Take 80 mg by mouth daily.), Disp: 90 capsule, Rfl: 1 .  rifampin (RIFADIN) 300 MG capsule, Take 1 capsule (300 mg total) by mouth 2 (two) times daily., Disp: 100 capsule, Rfl: 0 .  senna-docusate (SENOKOT-S) 8.6-50 MG tablet, Take 1 tablet by mouth at bedtime., Disp: 30 tablet, Rfl: 0 .  sitaGLIPtin (JANUVIA) 100 MG tablet, TAKE 1 TABLET (100 MG TOTAL) BY MOUTH DAILY. (Patient taking differently: Take 100 mg by mouth daily.), Disp: 90  tablet, Rfl: 10 .  vancomycin HCl (VANCOREADY) 1500 MG/300ML SOLN, Inject 300 mLs (1,500 mg total) into the vein every 12 (twelve) hours., Disp: 30000 mL, Rfl: 0 .  vancomycin IVPB, Inject 1,500 mg into the vein every 12 (twelve) hours. Indication:  Disseminated MRSA infection  First Dose: No Last Day of Therapy:  02/25/2021 Labs - Sunday/Monday:  CBC/D, BMP, and vancomycin trough. Labs - Thursday:  BMP and vancomycin trough Labs - Every other week:  ESR and CRP Method of administration:Elastomeric Method of administration may be changed at the discretion of the patient and/or caregiver's ability to self-administer the medication ordered., Disp: 86 Units, Rfl: 0    Review of systems as in history of present illness otherwise 12 point review of systems negative  Observations/Objective: Christopher Fernandez appears to be doing better on the whole though I am bothered by the new pain in his right leg and hip  Assessment and Plan:  Metastatic MRSA infection that became resistant to daptomycin  His back pain appears to improve be improving as well as his breathing.  Continuing vancomycin and rifampin through June 14 and seeing him on the  10th to reassess him.  This will be followed by protracted and likely lifelong antibiotics initially with doxycycline and rifampin for as long as he can tolerate the 2 of these antibiotics  Pain in the right hip and leg with history of osteomyelitis of pubic bone and possible myositis and adjacent tissues  Ordered an MRI of the right hip with contrast to evaluate this area I have worried about him developing a pyomyositis which he is so far had failed to do.  Diabetic foot infection with osteomyelitis: This was the original source we will need to continue to monitor the site.  Problems with urgency and incomplete voiding: Again could be related to prostate or to opiate therapy.  May need to be evaluated by urology or somewhere where there is capacity to bladder  scanning.  Problems with the PICC line: We will flush with tPA.  Exposure to COVID-19 of asked him to continue to [place] and to purchase additional rapid COVID-19 tests so that if he needs to go to an appointment he can self test the day of his appointment.  Diabetes mellitus: Followed by primary care  Would also like to give him every shield once he is gotten out of the window of quarantine to protect him against COVID-19 given his asplenia  Follow Up Instructions:    I discussed the assessment and treatment plan with the patient. The patient was provided an opportunity to ask questions and all were answered. The patient agreed with the plan and demonstrated an understanding of the instructions.   The patient was advised to call back or seek an in-person evaluation if the symptoms worsen or if the condition fails to improve as anticipated.  I spent greater than 40 minutes with the patient including greater than 50% of time in face to face counsel of the patient during his metastatic staphylococcal infection his exposure to COVID-19 his problems with urgency and incomplete voiding and in coordination of his care.Alcide Evener, MD

## 2021-01-24 ENCOUNTER — Other Ambulatory Visit: Payer: Self-pay | Admitting: Family Medicine

## 2021-01-24 ENCOUNTER — Other Ambulatory Visit (HOSPITAL_COMMUNITY): Payer: Self-pay

## 2021-01-24 ENCOUNTER — Other Ambulatory Visit: Payer: Self-pay

## 2021-01-24 ENCOUNTER — Other Ambulatory Visit: Payer: Self-pay | Admitting: Surgery

## 2021-01-24 DIAGNOSIS — G062 Extradural and subdural abscess, unspecified: Secondary | ICD-10-CM

## 2021-01-24 DIAGNOSIS — M869 Osteomyelitis, unspecified: Secondary | ICD-10-CM

## 2021-01-24 DIAGNOSIS — J90 Pleural effusion, not elsewhere classified: Secondary | ICD-10-CM

## 2021-01-24 MED ORDER — OXYCODONE-ACETAMINOPHEN 5-325 MG PO TABS
1.0000 | ORAL_TABLET | Freq: Three times a day (TID) | ORAL | 0 refills | Status: AC | PRN
  Filled 2021-01-24: qty 21, 7d supply, fill #0

## 2021-01-24 NOTE — Telephone Encounter (Signed)
Short course has been provided per our discussion.

## 2021-01-25 DIAGNOSIS — S98011A Complete traumatic amputation of right foot at ankle level, initial encounter: Secondary | ICD-10-CM | POA: Diagnosis not present

## 2021-01-25 DIAGNOSIS — A4902 Methicillin resistant Staphylococcus aureus infection, unspecified site: Secondary | ICD-10-CM | POA: Diagnosis not present

## 2021-01-25 DIAGNOSIS — L02611 Cutaneous abscess of right foot: Secondary | ICD-10-CM | POA: Diagnosis not present

## 2021-01-25 DIAGNOSIS — A327 Listerial sepsis: Secondary | ICD-10-CM | POA: Diagnosis not present

## 2021-01-27 ENCOUNTER — Encounter: Payer: Self-pay | Admitting: Infectious Diseases

## 2021-01-27 ENCOUNTER — Other Ambulatory Visit (HOSPITAL_COMMUNITY): Payer: Self-pay

## 2021-01-27 ENCOUNTER — Ambulatory Visit: Payer: 59 | Admitting: Family Medicine

## 2021-01-27 ENCOUNTER — Other Ambulatory Visit: Payer: Self-pay | Admitting: Adult Health

## 2021-01-27 ENCOUNTER — Telehealth: Payer: Self-pay

## 2021-01-27 DIAGNOSIS — E1169 Type 2 diabetes mellitus with other specified complication: Secondary | ICD-10-CM | POA: Diagnosis not present

## 2021-01-27 DIAGNOSIS — M4624 Osteomyelitis of vertebra, thoracic region: Secondary | ICD-10-CM | POA: Diagnosis not present

## 2021-01-27 DIAGNOSIS — M869 Osteomyelitis, unspecified: Secondary | ICD-10-CM | POA: Diagnosis not present

## 2021-01-27 DIAGNOSIS — M86151 Other acute osteomyelitis, right femur: Secondary | ICD-10-CM | POA: Diagnosis not present

## 2021-01-27 DIAGNOSIS — M4644 Discitis, unspecified, thoracic region: Secondary | ICD-10-CM | POA: Diagnosis not present

## 2021-01-27 DIAGNOSIS — E785 Hyperlipidemia, unspecified: Secondary | ICD-10-CM | POA: Diagnosis not present

## 2021-01-27 DIAGNOSIS — Q8901 Asplenia (congenital): Secondary | ICD-10-CM

## 2021-01-27 MED ORDER — MOLNUPIRAVIR EUA 200MG CAPSULE
4.0000 | ORAL_CAPSULE | Freq: Two times a day (BID) | ORAL | 0 refills | Status: AC
  Filled 2021-01-27: qty 40, 5d supply, fill #0

## 2021-01-27 NOTE — Telephone Encounter (Signed)
Patient calls to report that he has tested positive for Covid-19 on 01/30/21, but starting developing symptoms on 01/24/21.    Advised patient the follow:  If symptoms have improved and 24 hours have elapsed without a fever without the use of medication, your last day of in-home isolation is: 01/29/2021. Isolation ENDS on: 01/30/2021.   Continue to wear a mask through: 02/03/2021 from symptom onset. If you cannot mask, continue to isolate at home for the 5 additional days.  After this date, you may resume your usual activities but should still take precautions to reduce your risk (i.e. social distancing, frequent handwashing, and wearing a mask when social distancing isn't possible).  For symptom management contact PCP, if  symptoms fail to improve or you develop shortness of breath, please seek emergency care  Routing to Dr. Tommy Medal to make aware.  Eugenia Mcalpine

## 2021-01-27 NOTE — Progress Notes (Signed)
Outpatient Oral COVID Treatment Note  I connected with Christopher Fernandez on 01/27/2021/2:29 PM by telephone and verified that I am speaking with the correct person using two identifiers.  I discussed the limitations, risks, security, and privacy concerns of performing an evaluation and management service by telephone and the availability of in person appointments. I also discussed with the patient that there may be a patient responsible charge related to this service. The patient expressed understanding and agreed to proceed.  Patient location: home Provider location: [place]  Diagnosis: COVID-19 infection  Purpose of visit: Discussion of potential use of Molnupiravir or Paxlovid, a new treatment for mild to moderate COVID-19 viral infection in non-hospitalized patients.   Subjective: Patient is a 50 y.o. male who has been diagnosed with COVID 19 viral infection.  Their symptoms began on Friday, Jan 24, 2021 with a cough.  He tested positive today with an at home test.    Past Medical History:  Diagnosis Date  . Alcoholism ([place])   . Anemia   . Anxiety   . Arthritis   . Asthma   . Blood transfusion without reported diagnosis   . COPD (chronic obstructive pulmonary disease) ([place])   . Depression   . Diabetes ([place])    type 2  . GERD (gastroesophageal reflux disease)   . History of hiatal hernia   . HTN (hypertension)   . MRSA bacteremia   . MRSA infection 01/11/2020  . Neuromuscular disorder ([facility])    tremors  . Osteomyelitis ([place])    right forefoot  . Pancreatitis   . Pneumonia   . Substance abuse ([place])   . 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 Wears glasses     Allergies  Allergen Reactions  . Eggs Or Egg-Derived Products Rash  . Morphine And Related Other (See Comments)    Cant take because of pancreatitis  . Cocoa Rash     Current Outpatient Medications:  .  molnupiravir EUA 200 mg CAPS, Take 4 capsules (800 mg total) by mouth 2 (two) times daily for 5 days.,  Disp: 40 capsule, Rfl: 0 .  acetaminophen (TYLENOL) 500 MG tablet, Take 2 tablets (1,000 mg total) by mouth every 6 (six) hours., Disp: 30 tablet, Rfl: 0 .  albuterol (VENTOLIN HFA) 108 (90 Base) MCG/ACT inhaler, INHALE 2 TO 4 PUFFS INTO THE LUNGS EVERY 4 HOURS AS NEEDED FOR WHEEZING OR COUGH. (Patient taking differently: Inhale 2-4 puffs into the lungs every 4 (four) hours as needed for wheezing or shortness of breath.), Disp: 18 g, Rfl: 11 .  ARIPiprazole (ABILIFY) 5 MG tablet, TAKE 1 TABLET (5 MG TOTAL) BY MOUTH DAILY. (Patient taking differently: Take 5 mg by mouth every evening.), Disp: 90 tablet, Rfl: 2 .  atorvastatin (LIPITOR) 40 MG tablet, Take 1 tablet (40 mg total) by mouth daily., Disp: 90 tablet, Rfl: 3 .  baclofen (LIORESAL) 10 MG tablet, Take 1 tablet (10 mg total) by mouth 3 (three) times daily as needed for muscle spasms., Disp: 60 each, Rfl: 0 .  bismuth subsalicylate (PEPTO BISMOL) 262 MG/15ML suspension, Take 30 mLs by mouth every 6 (six) hours as needed for indigestion or diarrhea or loose stools., Disp: , Rfl:  .  cetirizine (ZYRTEC) 10 MG tablet, Take 1 tablet (10 mg total) by mouth daily. (Patient taking differently: Take 10 mg by mouth daily as needed for allergies.), Disp: 30 tablet, Rfl: 11 .  Continuous Blood Gluc Receiver (DEXCOM G6 RECEIVER) DEVI, 1 Device by  Does not apply route 4 (four) times daily., Disp: 1 each, Rfl: 11 .  Continuous Blood Gluc Transmit (DEXCOM G6 TRANSMITTER) MISC, 4 (four) times daily. as directed, Disp: , Rfl:  .  DULoxetine (CYMBALTA) 60 MG capsule, TAKE 1 CAPSULE (60 MG TOTAL) BY MOUTH DAILY. (Patient taking differently: Take 60 mg by mouth daily.), Disp: 90 capsule, Rfl: 2 .  EASY TOUCH PEN NEEDLES 31G X 5 MM MISC, Inject 1 application as directed 3 (three) times daily., Disp: 100 each, Rfl: 11 .  empagliflozin (JARDIANCE) 25 MG TABS tablet, TAKE 1 TABLET BY MOUTH DAILY. (Patient taking differently: Take 25 mg by mouth daily.), Disp: 90 tablet,  Rfl: 3 .  Fluticasone-Salmeterol (ADVAIR) 250-50 MCG/DOSE AEPB, INHALE 1 PUFF INTO THE LUNGS 2 TIMES DAILY. (Patient taking differently: Inhale 1 puff into the lungs in the morning and at bedtime.), Disp: 180 each, Rfl: 3 .  gabapentin (NEURONTIN) 300 MG capsule, TAKE 2 CAPSULES (600 MG TOTAL) BY MOUTH 3 (THREE) TIMES DAILY. (Patient taking differently: Take 600 mg by mouth 3 (three) times daily.), Disp: 180 capsule, Rfl: 2 .  glucose blood test strip, USE AS INSTRUCTED, Disp: 100 strip, Rfl: 12 .  ibuprofen (ADVIL) 400 MG tablet, Take 1 tablet (400 mg total) by mouth every 8 (eight) hours as needed for mild pain., Disp: 60 tablet, Rfl: 0 .  insulin glargine (LANTUS) 100 UNIT/ML injection, Inject 0.12 mLs (12 Units total) into the skin at bedtime., Disp: 10 mL, Rfl: 11 .  insulin lispro (HUMALOG) 100 UNIT/ML KwikPen, INJECT 6-10 UNITS INTO THE SKIN 3 (THREE) TIMES DAILY. (Patient taking differently: Inject 6-10 Units into the skin 3 (three) times daily. Sliding scale not provided), Disp: 15 mL, Rfl: 11 .  Lancets (FREESTYLE) lancets, Use as instructed, Disp: 100 each, Rfl: 12 .  lidocaine (LIDODERM) 5 %, PLACE 1 PATCH ONTO THE SKIN DAILY. REMOVE & DISCARD PATCH WITHIN 12 HOURS OR AS DIRECTED BY MD (Patient taking differently: Place 1 patch onto the skin daily. Remove & discard patch within 12 hours or as directed by MD), Disp: 30 patch, Rfl: 0 .  metFORMIN (GLUCOPHAGE) 1000 MG tablet, TAKE 1 TABLET (1,000 MG TOTAL) BY MOUTH 2 (TWO) TIMES DAILY WITH A MEAL. (Patient taking differently: Take 1,000 mg by mouth 2 (two) times daily with a meal.), Disp: 180 tablet, Rfl: 3 .  mirtazapine (REMERON) 30 MG tablet, TAKE 1 TABLET (30 MG TOTAL) BY MOUTH AT BEDTIME. (Patient taking differently: Take 30 mg by mouth at bedtime.), Disp: 90 tablet, Rfl: 3 .  montelukast (SINGULAIR) 10 MG tablet, TAKE 1 TABLET (10 MG TOTAL) BY MOUTH AT BEDTIME. (Patient taking differently: Take 10 mg by mouth in the morning.), Disp: 90  tablet, Rfl: 11 .  ondansetron (ZOFRAN ODT) 4 MG disintegrating tablet, Take 1 tablet (4 mg total) by mouth every 8 (eight) hours as needed for nausea or vomiting., Disp: 20 tablet, Rfl: 0 .  oxyCODONE-acetaminophen (PERCOCET/ROXICET) 5-325 MG tablet, Take 1 tablet by mouth every 8 (eight) hours as needed for up to 7 days for severe pain., Disp: 21 tablet, Rfl: 0 .  PANCRELIPASE, LIP-PROT-AMYL, PO, Take 36,000 Units by mouth with breakfast, with lunch, and with evening meal., Disp: , Rfl:  .  pantoprazole (PROTONIX) 40 MG tablet, TAKE 1 TABLET (40 MG TOTAL) BY MOUTH 2 (TWO) TIMES DAILY. (Patient taking differently: Take 40 mg by mouth 2 (two) times daily.), Disp: 60 tablet, Rfl: 0 .  polyethylene glycol powder (GLYCOLAX/MIRALAX) 17 GM/SCOOP powder, Dissolve  1 scoopful (17 g) in water and drink daily., Disp: 510 g, Rfl: 0 .  propranolol ER (INDERAL LA) 80 MG 24 hr capsule, TAKE 1 CAPSULE (80 MG TOTAL) BY MOUTH DAILY. (Patient taking differently: Take 80 mg by mouth daily.), Disp: 90 capsule, Rfl: 1 .  rifampin (RIFADIN) 300 MG capsule, Take 1 capsule (300 mg total) by mouth 2 (two) times daily., Disp: 100 capsule, Rfl: 0 .  senna-docusate (SENOKOT-S) 8.6-50 MG tablet, Take 1 tablet by mouth at bedtime., Disp: 30 tablet, Rfl: 0 .  sitaGLIPtin (JANUVIA) 100 MG tablet, TAKE 1 TABLET (100 MG TOTAL) BY MOUTH DAILY. (Patient taking differently: Take 100 mg by mouth daily.), Disp: 90 tablet, Rfl: 10 .  vancomycin HCl (VANCOREADY) 1500 MG/300ML SOLN, Inject 300 mLs (1,500 mg total) into the vein every 12 (twelve) hours., Disp: 30000 mL, Rfl: 0 .  vancomycin IVPB, Inject 1,500 mg into the vein every 12 (twelve) hours. Indication:  Disseminated MRSA infection  First Dose: No Last Day of Therapy:  02/25/2021 Labs - Sunday/Monday:  CBC/D, BMP, and vancomycin trough. Labs - Thursday:  BMP and vancomycin trough Labs - Every other week:  ESR and CRP Method of administration:Elastomeric Method of administration may be  changed at the discretion of the patient and/or caregiver's ability to self-administer the medication ordered., Disp: 86 Units, Rfl: 0  Objective: Patient sounds well.  They are in no apparent distress.  Breathing is non labored.  Mood and behavior are normal.  Laboratory Data:  In [facility]   Assessment: 50 y.o. male with mild/moderate COVID 19 viral infection diagnosed on 01/27/2021 at high risk for progression to severe COVID 19.  Plan:  This patient is a 50 y.o. male that meets the following criteria for Emergency Use Authorization of: Molnupiravir  1. Age >18 yr 2. SARS-COV-2 positive test 3. Symptom onset < 5 days 4. Mild-to-moderate COVID disease with high risk for severe progression to hospitalization or death   I have spoken and communicated the following to the patient or parent/caregiver regarding: 1. Molnupiravir is an unapproved drug that is authorized for use under an [employer] production pl[employer].  2. There are no adequate, approved, available products for the treatment of COVID-19 in adults who have mild-to-moderate COVID-19 and are at high risk for progressing to severe COVID-19, including hospitalization or death. 3. Other therapeutics are currently authorized. For additional information on all products authorized for treatment or prevention of COVID-19, please see [facility].  4. There are benefits and risks of taking this treatment as outlined in the "Fact Sheet for Patients and Caregivers."  5. "Fact Sheet for Patients and Caregivers" was reviewed with patient. A hard copy will be provided to patient from pharmacy prior to the patient receiving treatment. 6. Patients should continue to self-isolate and use infection control measures (e.g., wear mask, isolate, social distance, avoid sharing personal items, clean and disinfect "high touch" surfaces, and frequent  handwashing) according to CDC guidelines.  7. The patient or parent/caregiver has the option to accept or refuse treatment. 8. [facility] has established a pregnancy surveillance program. 9. Females of childbearing potential should use a reliable method of contraception correctly and consistently, as applicable, for the duration of treatment and for 4 days after the last dose of Molnupiravir. 7. Males of reproductive potential who are sexually active with females of childbearing potential should use a reliable method of contraception correctly and consistently during treatment and for at least 3 months after the last dose. 11. Pregnancy  status and risk was assessed. Patient verbalized understanding of precautions.   After reviewing above information with the patient, the patient agrees to receive molnupiravir.  After medication interaction check on his medications, he cannot take paxlovid due to interaction with Rifampin.  I reviewed with him that since he doesn't have a spleen, to please reach out if he worsens at all, because if so, we will place orders for [place].  He verbalized understanding.  Follow up instructions:    . Take prescription BID x 5 days as directed . Reach out if worsening, as you may need Bebtelovimab . Reach out to pharmacist for counseling on medication if desired . For concerns regarding further COVID symptoms please follow up with your PCP or urgent care . For urgent or life-threatening issues, seek care at your local emergency department  The patient was provided an opportunity to ask questions, and all were answered. The patient agreed with the plan and demonstrated an understanding of the instructions.   Script sent to [facility] and opted to pick up RX.   I provided 15 minutes of non face-to-face telephone visit time during this encounter, and > 50% was spent counseling as documented under my assessment & plan.  Scot Dock,  NP 01/27/2021 /2:29 PM

## 2021-01-27 NOTE — Telephone Encounter (Signed)
Patient calls nurse line stating he tested positive for covid this morning. Patient reports the only symptom he has is a cough that started on Friday. Patient denies SOB, fever/chills, body aches, or congestion. Patient stated his wife had covid recently and was given Molnupiravir which helped her symptoms. Patient is requesting this for himself. I do see where patient has reached out to infectious disease about same request, however they have not responded per patient. Please advise.

## 2021-01-27 NOTE — Telephone Encounter (Signed)
Patient returns call to nurse line stating ID has sent in the medication. Nothing further needed from [place].

## 2021-01-28 NOTE — Telephone Encounter (Signed)
Fyi Patient was within window and was started on molnupiravir.

## 2021-01-29 ENCOUNTER — Ambulatory Visit: Payer: Self-pay | Admitting: Surgery

## 2021-01-30 ENCOUNTER — Encounter (HOSPITAL_COMMUNITY): Payer: Self-pay

## 2021-01-30 ENCOUNTER — Ambulatory Visit (HOSPITAL_COMMUNITY): Admission: RE | Admit: 2021-01-30 | Payer: 59 | Source: Ambulatory Visit

## 2021-01-30 ENCOUNTER — Other Ambulatory Visit: Payer: Self-pay | Admitting: *Deleted

## 2021-01-30 DIAGNOSIS — E1169 Type 2 diabetes mellitus with other specified complication: Secondary | ICD-10-CM | POA: Diagnosis not present

## 2021-01-30 DIAGNOSIS — M4624 Osteomyelitis of vertebra, thoracic region: Secondary | ICD-10-CM | POA: Diagnosis not present

## 2021-01-30 DIAGNOSIS — M869 Osteomyelitis, unspecified: Secondary | ICD-10-CM | POA: Diagnosis not present

## 2021-01-30 DIAGNOSIS — E785 Hyperlipidemia, unspecified: Secondary | ICD-10-CM | POA: Diagnosis not present

## 2021-01-30 DIAGNOSIS — M4644 Discitis, unspecified, thoracic region: Secondary | ICD-10-CM | POA: Diagnosis not present

## 2021-01-30 DIAGNOSIS — M86151 Other acute osteomyelitis, right femur: Secondary | ICD-10-CM | POA: Diagnosis not present

## 2021-01-30 NOTE — Patient Outreach (Signed)
[name]) Care Management  01/30/2021  Christopher Fernandez 1970/11/18 [id number]   Transition of care /follow up call   Referral received: 01/17/21 Initial outreach:01/21/21 Insurance: [place]  Unsuccessful outreach call to patient able to leave a HIPAA compliant voicemail message for return call. Incoming return call from patient discussed that he is okay but could be doing better. He reported testing positive for covid on 5/16 with symptoms beginning on 5/13. She states that he contacted ID and has been started on Molnupiravir capsules for 5 days. Patient understands isolation period of 10 days end on 5/23 he states that his appointments have been rescheduled.  He denies having fever, shortness of breath reports having a cough that is about the same.  [name] reports that overall  pain is under better control, noted this morning some discomfort at back area. He discussed surgical incisional area healing as well as amputation site.  Patient continues with daily IV antibiotics and HHRN visit, [facility] PT/OT to resume after isolation period. He reports tolerating mobility in the home with use of cane.  Patient reports blood sugars running a little higher last 2 days 240's in the am encouraged notifying MD of continued consistent elevations in 200's. Discussed watching diet intake carbohydrates and staying hydrated.  Patient discussed that he has been able to enroll in the Active [employer] condition management program.      Objective: ChristopherChukwuebuka Fernandez was hospitalized [facility] 4/15-01/17/21 for Osteomyelitis with new abscess/phlegmon status post T8-11 lateral fusion and T9-10 corpectomy Right loculated pleural effusion status post thoracotomy, Osteomyelitis right hip/pubis   Comorbidities include: Type 2 Diabetes, peripheral neuropathy , chronic pancreatitis/pancreatic insufficiency, hyperlipidemia hypertension , amputation left first toe, right  metatarsal amputation.  He was discharged to home on 01/17/21 with [facility] services RN, PT/OT, [facility] IV antibiotic  home infusion  and with DME of Tub bench and 3 in 1.  Positive home covid test on 01/27/21 symptoms starting 5/13.   Plan Patient agreeable to return in the next week for ongoing assessment/support  of transition of care management needs.   Joylene Draft, RN, BSN  [place] Management Coordinator  [phone]- Mobile [phone]- Toll Free Main Office

## 2021-01-31 NOTE — Telephone Encounter (Signed)
Called patient to check in. He notes that COVID symptoms are mild overall, mostly just a cough. Notes otherwise is feeling a little better today. Notes that his wife is doing well and back to work as well. Happy they are both doing well. Recommended reaching out if there is anything [facility] could do for them. He voiced understanding and agreement.

## 2021-02-01 DIAGNOSIS — S98011A Complete traumatic amputation of right foot at ankle level, initial encounter: Secondary | ICD-10-CM | POA: Diagnosis not present

## 2021-02-01 DIAGNOSIS — L02611 Cutaneous abscess of right foot: Secondary | ICD-10-CM | POA: Diagnosis not present

## 2021-02-01 DIAGNOSIS — A327 Listerial sepsis: Secondary | ICD-10-CM | POA: Diagnosis not present

## 2021-02-01 DIAGNOSIS — A4902 Methicillin resistant Staphylococcus aureus infection, unspecified site: Secondary | ICD-10-CM | POA: Diagnosis not present

## 2021-02-03 ENCOUNTER — Other Ambulatory Visit (HOSPITAL_COMMUNITY): Payer: Self-pay

## 2021-02-03 DIAGNOSIS — M4644 Discitis, unspecified, thoracic region: Secondary | ICD-10-CM | POA: Diagnosis not present

## 2021-02-03 DIAGNOSIS — M4624 Osteomyelitis of vertebra, thoracic region: Secondary | ICD-10-CM | POA: Diagnosis not present

## 2021-02-03 DIAGNOSIS — M869 Osteomyelitis, unspecified: Secondary | ICD-10-CM | POA: Diagnosis not present

## 2021-02-03 DIAGNOSIS — E785 Hyperlipidemia, unspecified: Secondary | ICD-10-CM | POA: Diagnosis not present

## 2021-02-03 DIAGNOSIS — M86151 Other acute osteomyelitis, right femur: Secondary | ICD-10-CM | POA: Diagnosis not present

## 2021-02-03 DIAGNOSIS — E1169 Type 2 diabetes mellitus with other specified complication: Secondary | ICD-10-CM | POA: Diagnosis not present

## 2021-02-03 MED FILL — Metformin HCl Tab 1000 MG: ORAL | 90 days supply | Qty: 180 | Fill #0 | Status: AC

## 2021-02-06 ENCOUNTER — Ambulatory Visit (INDEPENDENT_AMBULATORY_CARE_PROVIDER_SITE_OTHER): Payer: Self-pay | Admitting: Surgery

## 2021-02-06 ENCOUNTER — Other Ambulatory Visit: Payer: Self-pay

## 2021-02-06 ENCOUNTER — Ambulatory Visit
Admission: RE | Admit: 2021-02-06 | Discharge: 2021-02-06 | Disposition: A | Discharge status: Home / Self Care | Payer: 59 | Source: Ambulatory Visit | Attending: Surgery | Admitting: Surgery

## 2021-02-06 ENCOUNTER — Telehealth: Payer: Self-pay

## 2021-02-06 ENCOUNTER — Encounter: Payer: Self-pay | Admitting: Surgery

## 2021-02-06 VITALS — BP 140/88 | HR 96 | Resp 20 | Ht 75.0 in | Wt 234.0 lb

## 2021-02-06 DIAGNOSIS — J90 Pleural effusion, not elsewhere classified: Secondary | ICD-10-CM | POA: Diagnosis not present

## 2021-02-06 DIAGNOSIS — M4644 Discitis, unspecified, thoracic region: Secondary | ICD-10-CM | POA: Diagnosis not present

## 2021-02-06 DIAGNOSIS — M86151 Other acute osteomyelitis, right femur: Secondary | ICD-10-CM | POA: Diagnosis not present

## 2021-02-06 DIAGNOSIS — M869 Osteomyelitis, unspecified: Secondary | ICD-10-CM | POA: Diagnosis not present

## 2021-02-06 DIAGNOSIS — Z09 Encounter for follow-up examination after completed treatment for conditions other than malignant neoplasm: Secondary | ICD-10-CM

## 2021-02-06 DIAGNOSIS — M4624 Osteomyelitis of vertebra, thoracic region: Secondary | ICD-10-CM | POA: Diagnosis not present

## 2021-02-06 DIAGNOSIS — E785 Hyperlipidemia, unspecified: Secondary | ICD-10-CM | POA: Diagnosis not present

## 2021-02-06 DIAGNOSIS — E1169 Type 2 diabetes mellitus with other specified complication: Secondary | ICD-10-CM | POA: Diagnosis not present

## 2021-02-06 NOTE — Progress Notes (Signed)
HPI: Patient returns for routine postoperative follow-up having undergone right thoracotomy for spine exposure for treatment of T9-T10 spinal osteomyelitis by Dr. Ellene Route on 01/03/2021.. The patient's early postoperative recovery while in the hospital was notable for a return to the operating room on 01/10/2021 for redo right thoracotomy and drainage of a large loculated right pleural effusion.  He made an uneventful recovery after that and was discharged home on IV antibiotics. Since hospital discharge the patient reports that he has been feeling well.  He has no pain.  He has not been ambulating without shortness of breath.  He has not seen Dr. Ellene Route in follow-up yet.   Current Outpatient Medications  Medication Sig Dispense Refill  . acetaminophen (TYLENOL) 500 MG tablet Take 2 tablets (1,000 mg total) by mouth every 6 (six) hours. 30 tablet 0  . albuterol (VENTOLIN HFA) 108 (90 Base) MCG/ACT inhaler INHALE 2 TO 4 PUFFS INTO THE LUNGS EVERY 4 HOURS AS NEEDED FOR WHEEZING OR COUGH. (Patient taking differently: Inhale 2-4 puffs into the lungs every 4 (four) hours as needed for wheezing or shortness of breath.) 18 g 11  . ARIPiprazole (ABILIFY) 5 MG tablet TAKE 1 TABLET (5 MG TOTAL) BY MOUTH DAILY. (Patient taking differently: Take 5 mg by mouth every evening.) 90 tablet 2  . atorvastatin (LIPITOR) 40 MG tablet Take 1 tablet (40 mg total) by mouth daily. 90 tablet 3  . baclofen (LIORESAL) 10 MG tablet Take 1 tablet (10 mg total) by mouth 3 (three) times daily as needed for muscle spasms. 60 each 0  . bismuth subsalicylate (PEPTO BISMOL) 262 MG/15ML suspension Take 30 mLs by mouth every 6 (six) hours as needed for indigestion or diarrhea or loose stools.    . cetirizine (ZYRTEC) 10 MG tablet Take 1 tablet (10 mg total) by mouth daily. (Patient taking differently: Take 10 mg by mouth daily as needed for allergies.) 30 tablet 11  . Continuous Blood Gluc Receiver (DEXCOM G6 RECEIVER) DEVI 1 Device by  Does not apply route 4 (four) times daily. 1 each 11  . Continuous Blood Gluc Transmit (DEXCOM G6 TRANSMITTER) MISC 4 (four) times daily. as directed    . DULoxetine (CYMBALTA) 60 MG capsule TAKE 1 CAPSULE (60 MG TOTAL) BY MOUTH DAILY. (Patient taking differently: Take 60 mg by mouth daily.) 90 capsule 2  . EASY TOUCH PEN NEEDLES 31G X 5 MM MISC Inject 1 application as directed 3 (three) times daily. 100 each 11  . empagliflozin (JARDIANCE) 25 MG TABS tablet TAKE 1 TABLET BY MOUTH DAILY. (Patient taking differently: Take 25 mg by mouth daily.) 90 tablet 3  . Fluticasone-Salmeterol (ADVAIR) 250-50 MCG/DOSE AEPB INHALE 1 PUFF INTO THE LUNGS 2 TIMES DAILY. (Patient taking differently: Inhale 1 puff into the lungs in the morning and at bedtime.) 180 each 3  . gabapentin (NEURONTIN) 300 MG capsule TAKE 2 CAPSULES (600 MG TOTAL) BY MOUTH 3 (THREE) TIMES DAILY. (Patient taking differently: Take 600 mg by mouth 3 (three) times daily.) 180 capsule 2  . glucose blood test strip USE AS INSTRUCTED 100 strip 12  . ibuprofen (ADVIL) 400 MG tablet Take 1 tablet (400 mg total) by mouth every 8 (eight) hours as needed for mild pain. 60 tablet 0  . insulin lispro (HUMALOG) 100 UNIT/ML KwikPen INJECT 6-10 UNITS INTO THE SKIN 3 (THREE) TIMES DAILY. (Patient taking differently: Inject 6-10 Units into the skin 3 (three) times daily. Sliding scale not provided) 15 mL 11  . Lancets (FREESTYLE)  lancets Use as instructed 100 each 12  . lidocaine (LIDODERM) 5 % PLACE 1 PATCH ONTO THE SKIN DAILY. REMOVE & DISCARD PATCH WITHIN 12 HOURS OR AS DIRECTED BY MD (Patient taking differently: Place 1 patch onto the skin daily. Remove & discard patch within 12 hours or as directed by MD) 30 patch 0  . metFORMIN (GLUCOPHAGE) 1000 MG tablet TAKE 1 TABLET (1,000 MG TOTAL) BY MOUTH 2 (TWO) TIMES DAILY WITH A MEAL. (Patient taking differently: Take 1,000 mg by mouth 2 (two) times daily with a meal.) 180 tablet 3  . mirtazapine (REMERON) 30 MG  tablet TAKE 1 TABLET (30 MG TOTAL) BY MOUTH AT BEDTIME. (Patient taking differently: Take 30 mg by mouth at bedtime.) 90 tablet 3  . montelukast (SINGULAIR) 10 MG tablet TAKE 1 TABLET (10 MG TOTAL) BY MOUTH AT BEDTIME. (Patient taking differently: Take 10 mg by mouth in the morning.) 90 tablet 11  . ondansetron (ZOFRAN ODT) 4 MG disintegrating tablet Take 1 tablet (4 mg total) by mouth every 8 (eight) hours as needed for nausea or vomiting. 20 tablet 0  . PANCRELIPASE, LIP-PROT-AMYL, PO Take 36,000 Units by mouth with breakfast, with lunch, and with evening meal.    . pantoprazole (PROTONIX) 40 MG tablet TAKE 1 TABLET (40 MG TOTAL) BY MOUTH 2 (TWO) TIMES DAILY. (Patient taking differently: Take 40 mg by mouth 2 (two) times daily.) 60 tablet 0  . propranolol ER (INDERAL LA) 80 MG 24 hr capsule TAKE 1 CAPSULE (80 MG TOTAL) BY MOUTH DAILY. (Patient taking differently: Take 80 mg by mouth daily.) 90 capsule 1  . rifampin (RIFADIN) 300 MG capsule Take 1 capsule (300 mg total) by mouth 2 (two) times daily. 100 capsule 0  . senna-docusate (SENOKOT-S) 8.6-50 MG tablet Take 1 tablet by mouth at bedtime. 30 tablet 0  . sitaGLIPtin (JANUVIA) 100 MG tablet TAKE 1 TABLET (100 MG TOTAL) BY MOUTH DAILY. (Patient taking differently: Take 100 mg by mouth daily.) 90 tablet 10  . vancomycin HCl (VANCOREADY) 1500 MG/300ML SOLN Inject 300 mLs (1,500 mg total) into the vein every 12 (twelve) hours. 30000 mL 0  . vancomycin IVPB Inject 1,500 mg into the vein every 12 (twelve) hours. Indication:  Disseminated MRSA infection  First Dose: No Last Day of Therapy:  02/25/2021 Labs - Sunday/Monday:  CBC/D, BMP, and vancomycin trough. Labs - Thursday:  BMP and vancomycin trough Labs - Every other week:  ESR and CRP Method of administration:Elastomeric Method of administration may be changed at the discretion of the patient and/or caregiver's ability to self-administer the medication ordered. 86 Units 0   No current  facility-administered medications for this visit.    Physical Exam: BP 140/88   Pulse 96   Resp 20   Ht '6\' 3"'  (1.905 m)   Wt 234 lb (106.1 kg)   SpO2 95% Comment: RA  BMI 29.25 kg/m  He looks well. Cardiac exam shows a regular rate and rhythm with normal heart sounds.  There is no murmur. Lungs are clear. The right chest incision is well-healed.  The chest tube sites are healed and I removed the chest tube sutures.  Diagnostic Tests:  Narrative & Impression  CLINICAL DATA:  Pleural effusion.  EXAM: CHEST - 2 VIEW  COMPARISON:  Radiograph 01/15/2021.  CT 01/08/2021  FINDINGS: Right upper extremity PICC tip in the lower SVC. Small right pleural effusion is mildly improved from prior exam with decreased loculated component laterally. Associated ill-defined right basilar opacities likely atelectasis  or scarring. Punctate density or surgical clips in the posterior right mid lung unchanged. Normal heart size with stable mediastinal contours. No pulmonary edema. Trace left pleural effusion. Lower thoracic corpectomy with intact surgical hardware.  IMPRESSION: 1. Mild improvement in small right pleural effusion with decreased loculated component laterally. Associated right basilar atelectasis or scarring. 2. Trace left pleural effusion.   Electronically Signed   By: Keith Rake M.D.   On: 02/06/2021 15:49      Impression:  He is recovering well following his surgery and continues on intravenous antibiotics until sometime in June.  His chest x-ray shows minimal residual right pleural effusion at the right costophrenic angle.  I encouraged him to continue ambulating as much possible but asked him not to lift anything heavier than about 10 pounds for 3 months postoperatively to allow his chest to completely heal.  Plan:  He is going to contact Dr. Clarice Pole office for follow-up appointment.  He will return to see me if he has any problems with his  incisions.   Gaye Pollack, MD [facility] [phone]

## 2021-02-06 NOTE — Telephone Encounter (Signed)
Patient has wound underneath his left third toe - left second toe previously amputated. Says it is draining pus and blood and has been for about a week. Patient has some sensation in his foot and says wound is not painful. Says it is very dark - almost black. His left foot is also swollen. Denies fever or chills. He is currently taking vancomycin for a back infection. He would like to follow up with [place]. Placed him on schedule for Tuesday.

## 2021-02-06 NOTE — Telephone Encounter (Signed)
Left message requesting an explanation on appointment still not being scheduled after 3 months

## 2021-02-07 ENCOUNTER — Other Ambulatory Visit (HOSPITAL_COMMUNITY): Payer: Self-pay

## 2021-02-07 MED ORDER — CYCLOBENZAPRINE HCL 10 MG PO TABS
10.0000 mg | ORAL_TABLET | Freq: Three times a day (TID) | ORAL | 0 refills | Status: DC
  Filled 2021-02-07: qty 60, 20d supply, fill #0

## 2021-02-07 MED ORDER — OXYCODONE-ACETAMINOPHEN 10-325 MG PO TABS
1.0000 | ORAL_TABLET | Freq: Four times a day (QID) | ORAL | 0 refills | Status: DC | PRN
  Filled 2021-02-07: qty 60, 15d supply, fill #0

## 2021-02-08 DIAGNOSIS — S98011A Complete traumatic amputation of right foot at ankle level, initial encounter: Secondary | ICD-10-CM | POA: Diagnosis not present

## 2021-02-08 DIAGNOSIS — A327 Listerial sepsis: Secondary | ICD-10-CM | POA: Diagnosis not present

## 2021-02-08 DIAGNOSIS — L02611 Cutaneous abscess of right foot: Secondary | ICD-10-CM | POA: Diagnosis not present

## 2021-02-08 DIAGNOSIS — A4902 Methicillin resistant Staphylococcus aureus infection, unspecified site: Secondary | ICD-10-CM | POA: Diagnosis not present

## 2021-02-11 ENCOUNTER — Other Ambulatory Visit: Payer: Self-pay

## 2021-02-11 ENCOUNTER — Encounter: Payer: Self-pay | Admitting: Vascular Surgery

## 2021-02-11 ENCOUNTER — Ambulatory Visit (INDEPENDENT_AMBULATORY_CARE_PROVIDER_SITE_OTHER): Payer: 59 | Admitting: Vascular Surgery

## 2021-02-11 DIAGNOSIS — M869 Osteomyelitis, unspecified: Secondary | ICD-10-CM | POA: Insufficient documentation

## 2021-02-11 DIAGNOSIS — E1169 Type 2 diabetes mellitus with other specified complication: Secondary | ICD-10-CM | POA: Diagnosis not present

## 2021-02-11 DIAGNOSIS — M4644 Discitis, unspecified, thoracic region: Secondary | ICD-10-CM | POA: Diagnosis not present

## 2021-02-11 DIAGNOSIS — M86151 Other acute osteomyelitis, right femur: Secondary | ICD-10-CM | POA: Diagnosis not present

## 2021-02-11 DIAGNOSIS — E785 Hyperlipidemia, unspecified: Secondary | ICD-10-CM | POA: Diagnosis not present

## 2021-02-11 DIAGNOSIS — M4624 Osteomyelitis of vertebra, thoracic region: Secondary | ICD-10-CM | POA: Diagnosis not present

## 2021-02-11 NOTE — Progress Notes (Signed)
Patient name: Christopher Fernandez MRN: [id number] DOB: 1971-04-24 Sex: male  REASON FOR VISIT: Evaluate draining wound from left 3rd toe  HPI: Christopher Fernandez is a 50 y.o. male with history of diabetes and HTN that presents for evaluation of new draining wound of the left third toe.  States this has been ongoing for about 3 weeks.  States he noticed pus draining out of the toe.  He has a fairly complex history and initially had a right second toe amp converted to a right TMA by orthopedic surgery and then was sent to see me for a second opinion and I debrided his right TMA in 2021 and placed him on IV antibiotics and this eventually healed.  He then underwent a left great toe amp with Dr. Donzetta Matters in December 2021 and left second toe amp by Dr. Oneida Alar in March 2022.  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  He later had to have redo thoracotomy due to empyema.  He is on IV antibiotics and is followed by ID for MRSA bacterermia and he is now on vancomycin and rifampin.    Past Medical History:  Diagnosis Date  . Alcoholism ([place])   . Anemia   . Anxiety   . Arthritis   . Asthma   . Blood transfusion without reported diagnosis   . COPD (chronic obstructive pulmonary disease) ([place])   . Depression   . Diabetes ([place])    type 2  . GERD (gastroesophageal reflux disease)   . History of hiatal hernia   . HTN (hypertension)   . MRSA bacteremia   . MRSA infection 01/11/2020  . Neuromuscular disorder (HCC)    tremors  . Osteomyelitis ([place])    right forefoot  . Pancreatitis   . Pneumonia   . Substance abuse ([place])   . 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 Wears glasses     Past Surgical History:  Procedure Laterality Date  . 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 AMPUTATION Right 10/18/2019   Procedure: RIGHT SECOND TOE AMPUTATION;  Surgeon: Newt Minion, MD;  Location: [place];  Service:  Orthopedics;  Laterality: Right;  . AMPUTATION Right 11/08/2019   Procedure: RIGHT TRANSMETATARSAL AMPUTATION;  Surgeon: Newt Minion, MD;  Location: [place];  Service: Orthopedics;  Laterality: Right;  . AMPUTATION Left 12/02/2020   Procedure: AMPUTATION 2ND TOE;  Surgeon: Elam Dutch, MD;  Location: [facility];  Service: Vascular;  Laterality: Left;  . APPLICATION OF WOUND VAC Right 01/10/2020   Procedure: APPLICATION OF WOUND VAC, right foot;  Surgeon: [name], MD;  Location: [place];  Service: Vascular;  Laterality: Right;  . BILIARY DILATION  12/06/2020   Procedure: BILIARY DILATION;  Surgeon: Rush Landmark Telford Nab., MD;  Location: [place];  Service: Gastroenterology;;  . BIOPSY  12/06/2020   Procedure: BIOPSY;  Surgeon: Irving Copas., MD;  Location: [facility] ENDOSCOPY;  Service: Gastroenterology;;  . Lorin [facility]  2006   with gallbladder and spleen  . COLONOSCOPY  8-10 years ago    in [place] exam per pt  . ENDOSCOPIC RETROGRADE CHOLANGIOPANCREATOGRAPHY (ERCP) WITH PROPOFOL N/A 12/06/2020   Procedure: ENDOSCOPIC RETROGRADE CHOLANGIOPANCREATOGRAPHY (ERCP) WITH PROPOFOL;  Surgeon: Rush Landmark Telford Nab., MD;  Location: [place];  Service: Gastroenterology;  Laterality: N/A;  . HERNIA REPAIR  2008, 2010   hiatal hernia and 1 additional  . NASAL SEPTUM SURGERY    .  PANCREATIC PSEUDOCYST DRAINAGE    . PANCREATIC STENT PLACEMENT  12/06/2020   Procedure: PANCREATIC STENT PLACEMENT;  Surgeon: Rush Landmark Telford Nab., MD;  Location: [place];  Service: Gastroenterology;;  . PLEURAL EFFUSION DRAINAGE Right 01/10/2021   Procedure: DRAINAGE OF PLEURAL EFFUSION;  Surgeon: Gaye Pollack, MD;  Location: [place];  Service: Thoracic;  Laterality: Right;  . REMOVAL OF STONES  12/06/2020   Procedure: REMOVAL OF STONES;  Surgeon: Irving Copas., MD;  Location: [place];  Service: Gastroenterology;;  . [name]  12/06/2020   Procedure: [name];   Surgeon: Irving Copas., MD;  Location: [place];  Service: Gastroenterology;;  . SPLENECTOMY  2006  . STUMP REVISION Right 11/24/2019   Procedure: REVISION RIGHT TRANSMETATARSAL AMPUTATION;  Surgeon: Newt Minion, MD;  Location: [place];  Service: Orthopedics;  Laterality: Right;  . TEE WITHOUT CARDIOVERSION N/A 12/04/2020   Procedure: TRANSESOPHAGEAL ECHOCARDIOGRAM (TEE);  Surgeon: Acie Fredrickson Wonda Cheng, MD;  Location: [facility] ENDOSCOPY;  Service: Cardiovascular;  Laterality: N/A;  . THORACIC EXPOSURE Right 01/03/2021   Procedure: THORACIC EXPOSURE with removal of rib;  Surgeon: Gaye Pollack, MD;  Location: [place];  Service: Cardiothoracic;  Laterality: Right;  . THORACOTOMY Right 01/10/2021   Procedure: REDO THORACOTOMY;  Surgeon: Gaye Pollack, MD;  Location: [facility] OR;  Service: Thoracic;  Laterality: Right;  . [place] VERTEBRECTOMY Right 01/03/2021   Procedure: Decompression of  Thoracic nine and Thoracic ten via corpectomy reconstruction with titanium spacer and rib autogrtaft. Lateral plate fixation Thoracic eight - Thoracic eleven;  Surgeon: Kristeen Miss, MD;  Location: [place];  Service: Neurosurgery;  Laterality: Right;  . TOTAL KNEE ARTHROPLASTY Right 1982, 1984  . TRANSMETATARSAL AMPUTATION Left 09/06/2020   Procedure: AMPUTATION LEFT GREAT TOE;  Surgeon: Waynetta Sandy, MD;  Location: [place];  Service: Vascular;  Laterality: Left;  . WOUND DEBRIDEMENT Right 01/10/2020   Procedure: incisional DEBRIDEMENT of RIGHT TRANSMETATARSAL WOUND;  Surgeon: [name], MD;  Location: [facility] OR;  Service: Vascular;  Laterality: Right;    Family History  Problem Relation Age of Onset  . Diabetes Mother   . Hypertension Mother   . Hyperlipidemia Mother   . Kidney disease Mother   . Thyroid disease Mother   . Breast cancer Mother        mets  . Lung cancer Mother   . Diabetes Father   . Alcohol abuse Sister   . Sickle cell trait Sister   . Diabetes Brother   . Asthma  Brother   . Lung cancer Maternal Grandmother   . Kidney disease Maternal Grandmother   . Liver cancer Maternal Uncle        x 4-5  . Kidney disease Maternal Uncle   . Kidney disease Paternal Uncle   . Colon cancer Neg Hx   . Colon polyps Neg Hx   . Esophageal cancer Neg Hx   . Rectal cancer Neg Hx   . Stomach cancer Neg Hx     SOCIAL HISTORY: Social History   Tobacco Use  . Smoking status: Current Every Day Smoker    Years: 38.00    Types: Cigarettes    Start date: 09/14/1982  . Smokeless tobacco: Never Used  . Tobacco comment: 2-3 cigarettes a day  Substance Use Topics  . Alcohol use: Not Currently    Comment: 2-3 per day, none since 2019    Allergies  Allergen Reactions  . Eggs Or Egg-Derived Products Rash  . Morphine And Related Other (  See Comments)    Cant take because of pancreatitis  . Cocoa Rash    Current Outpatient Medications  Medication Sig Dispense Refill  . acetaminophen (TYLENOL) 500 MG tablet Take 2 tablets (1,000 mg total) by mouth every 6 (six) hours. 30 tablet 0  . albuterol (VENTOLIN HFA) 108 (90 Base) MCG/ACT inhaler INHALE 2 TO 4 PUFFS INTO THE LUNGS EVERY 4 HOURS AS NEEDED FOR WHEEZING OR COUGH. (Patient taking differently: Inhale 2-4 puffs into the lungs every 4 (four) hours as needed for wheezing or shortness of breath.) 18 g 11  . ARIPiprazole (ABILIFY) 5 MG tablet TAKE 1 TABLET (5 MG TOTAL) BY MOUTH DAILY. (Patient taking differently: Take 5 mg by mouth every evening.) 90 tablet 2  . atorvastatin (LIPITOR) 40 MG tablet Take 1 tablet (40 mg total) by mouth daily. 90 tablet 3  . baclofen (LIORESAL) 10 MG tablet Take 1 tablet (10 mg total) by mouth 3 (three) times daily as needed for muscle spasms. 60 each 0  . bismuth subsalicylate (PEPTO BISMOL) 262 MG/15ML suspension Take 30 mLs by mouth every 6 (six) hours as needed for indigestion or diarrhea or loose stools.    . cetirizine (ZYRTEC) 10 MG tablet Take 1 tablet (10 mg total) by mouth daily.  (Patient taking differently: Take 10 mg by mouth daily as needed for allergies.) 30 tablet 11  . Continuous Blood Gluc Receiver (DEXCOM G6 RECEIVER) DEVI 1 Device by Does not apply route 4 (four) times daily. 1 each 11  . Continuous Blood Gluc Transmit (DEXCOM G6 TRANSMITTER) MISC 4 (four) times daily. as directed    . cyclobenzaprine (FLEXERIL) 10 MG tablet Take 1 tablet (10 mg total) by mouth 3 (three) times daily. 60 tablet 0  . DULoxetine (CYMBALTA) 60 MG capsule TAKE 1 CAPSULE (60 MG TOTAL) BY MOUTH DAILY. (Patient taking differently: Take 60 mg by mouth daily.) 90 capsule 2  . EASY TOUCH PEN NEEDLES 31G X 5 MM MISC Inject 1 application as directed 3 (three) times daily. 100 each 11  . empagliflozin (JARDIANCE) 25 MG TABS tablet TAKE 1 TABLET BY MOUTH DAILY. (Patient taking differently: Take 25 mg by mouth daily.) 90 tablet 3  . Fluticasone-Salmeterol (ADVAIR) 250-50 MCG/DOSE AEPB INHALE 1 PUFF INTO THE LUNGS 2 TIMES DAILY. (Patient taking differently: Inhale 1 puff into the lungs in the morning and at bedtime.) 180 each 3  . gabapentin (NEURONTIN) 300 MG capsule TAKE 2 CAPSULES (600 MG TOTAL) BY MOUTH 3 (THREE) TIMES DAILY. (Patient taking differently: Take 600 mg by mouth 3 (three) times daily.) 180 capsule 2  . glucose blood test strip USE AS INSTRUCTED 100 strip 12  . ibuprofen (ADVIL) 400 MG tablet Take 1 tablet (400 mg total) by mouth every 8 (eight) hours as needed for mild pain. 60 tablet 0  . insulin lispro (HUMALOG) 100 UNIT/ML KwikPen INJECT 6-10 UNITS INTO THE SKIN 3 (THREE) TIMES DAILY. (Patient taking differently: Inject 6-10 Units into the skin 3 (three) times daily. Sliding scale not provided) 15 mL 11  . Lancets (FREESTYLE) lancets Use as instructed 100 each 12  . lidocaine (LIDODERM) 5 % PLACE 1 PATCH ONTO THE SKIN DAILY. REMOVE & DISCARD PATCH WITHIN 12 HOURS OR AS DIRECTED BY MD (Patient taking differently: Place 1 patch onto the skin daily. Remove & discard patch within 12  hours or as directed by MD) 30 patch 0  . metFORMIN (GLUCOPHAGE) 1000 MG tablet TAKE 1 TABLET (1,000 MG TOTAL) BY MOUTH 2 (TWO)  TIMES DAILY WITH A MEAL. (Patient taking differently: Take 1,000 mg by mouth 2 (two) times daily with a meal.) 180 tablet 3  . mirtazapine (REMERON) 30 MG tablet TAKE 1 TABLET (30 MG TOTAL) BY MOUTH AT BEDTIME. (Patient taking differently: Take 30 mg by mouth at bedtime.) 90 tablet 3  . montelukast (SINGULAIR) 10 MG tablet TAKE 1 TABLET (10 MG TOTAL) BY MOUTH AT BEDTIME. (Patient taking differently: Take 10 mg by mouth in the morning.) 90 tablet 11  . ondansetron (ZOFRAN ODT) 4 MG disintegrating tablet Take 1 tablet (4 mg total) by mouth every 8 (eight) hours as needed for nausea or vomiting. 20 tablet 0  . oxyCODONE-acetaminophen (PERCOCET) 10-325 MG tablet Take 1 tablet by mouth every 6 (six) hours as needed. 60 tablet 0  . PANCRELIPASE, LIP-PROT-AMYL, PO Take 36,000 Units by mouth with breakfast, with lunch, and with evening meal.    . pantoprazole (PROTONIX) 40 MG tablet TAKE 1 TABLET (40 MG TOTAL) BY MOUTH 2 (TWO) TIMES DAILY. (Patient taking differently: Take 40 mg by mouth 2 (two) times daily.) 60 tablet 0  . propranolol ER (INDERAL LA) 80 MG 24 hr capsule TAKE 1 CAPSULE (80 MG TOTAL) BY MOUTH DAILY. (Patient taking differently: Take 80 mg by mouth daily.) 90 capsule 1  . rifampin (RIFADIN) 300 MG capsule Take 1 capsule (300 mg total) by mouth 2 (two) times daily. 100 capsule 0  . senna-docusate (SENOKOT-S) 8.6-50 MG tablet Take 1 tablet by mouth at bedtime. 30 tablet 0  . sitaGLIPtin (JANUVIA) 100 MG tablet TAKE 1 TABLET (100 MG TOTAL) BY MOUTH DAILY. (Patient taking differently: Take 100 mg by mouth daily.) 90 tablet 10  . vancomycin HCl (VANCOREADY) 1500 MG/300ML SOLN Inject 300 mLs (1,500 mg total) into the vein every 12 (twelve) hours. 30000 mL 0  . vancomycin IVPB Inject 1,500 mg into the vein every 12 (twelve) hours. Indication:  Disseminated MRSA infection   First Dose: No Last Day of Therapy:  02/25/2021 Labs - Sunday/Monday:  CBC/D, BMP, and vancomycin trough. Labs - Thursday:  BMP and vancomycin trough Labs - Every other week:  ESR and CRP Method of administration:Elastomeric Method of administration may be changed at the discretion of the patient and/or caregiver's ability to self-administer the medication ordered. 86 Units 0   No current facility-administered medications for this visit.    REVIEW OF SYSTEMS:  '[X]'  denotes positive finding, '[ ]'  denotes negative finding Cardiac  Comments:  Chest pain or chest pressure:    Shortness of breath upon exertion:    Short of breath when lying flat:    Irregular heart rhythm:        Vascular    Pain in calf, thigh, or hip brought on by ambulation:    Pain in feet at night that wakes you up from your sleep:     Blood clot in your veins:    Leg swelling:         Pulmonary    Oxygen at home:    Productive cough:     Wheezing:         Neurologic    Sudden weakness in arms or legs:     Sudden numbness in arms or legs:     Sudden onset of difficulty speaking or slurred speech:    Temporary loss of vision in one eye:     Problems with dizziness:         Gastrointestinal    Blood in stool:  Vomited blood:         Genitourinary    Burning when urinating:     Blood in urine:        Psychiatric    Major depression:         Hematologic    Bleeding problems:    Problems with blood clotting too easily:        Skin    Rashes or ulcers:        Constitutional    Fever or chills:      PHYSICAL EXAM: Vitals:   02/11/21 1043  BP: 128/89  Pulse: 96  Temp: 97.7 F (36.5 C)  TempSrc: Skin  SpO2: 95%  Weight: 232 lb 4.8 oz (105.4 kg)  Height: '6\' 3"'  (1.905 m)    GENERAL: The patient is a well-nourished male, in no acute distress. The vital signs are documented above. CARDIAC: There is a regular rate and rhythm.  VASCULAR:  Left DP palpable Left 1st/2nd toe amputations  healed Left 3rd toe with open draining wound Right TMA now healed      DATA:   None  Assessment/Plan:  50 year old male with history of MRSA infection and MRSA bacteremia now with what appears to be osteomyelitis and open wound of his left third toe.  I recommended a left third toe amputation.  He has a palpable dorsalis pedis pulse in the foot and he has previously healed a left great toe and second toe amputation.  He is already on IV antibiotics with ID given his epidural abscess and recent thoracotomy with reconstruction of his spinal osteomyelitis.  We will get him scheduled for early next week.   [name], MD [facility] Office: [phone]

## 2021-02-11 NOTE — Progress Notes (Signed)
Subjective:   Patient ID: DARVIN DIALS    DOB: Oct 17, 1970, 50 y.o. male   MRN: [id number]  JARREN PARA is a 50 y.o. male with a history of hypertension, pericardial effusion, asthma, COPD, pleural effusion, chronic pancreatitis, history of choledocholithiasis, exocrine pancreatic insufficiency, GERD, history of diabetic foot infection with osteomyelitis status post multiple here toe amputation, type 2 diabetes with neuropathy, hyperlipidemia, osteomyelitis of thoracolumbar region/pelvic region and epidural abscess, BPH, abnormal ABI, action tremor, history of alcohol use disorder, anxiety/depression, asplenia, constipation, ED, history of MRSA, RLS here for diabetes follow up  Acute concerns: Feel area on back at incision line is starting to be more red and warm. Would like it to be looked at. Denies any fevers/chills. Pain is pretty well controlled. Continues to be on Percocet 10-325mg , takes about BID, and Flexeril 10mg .  He notes that he has another toe that is infected (3rd toe on left foot) with plan to amputate next week.   Diabetes: Last three A1C's below. Currently on Jardiance 25mg  QD, Metformin 1000mg  BID (has cut this in half to 500mg  BID), Januvia 100mg  QD, Humalog 4-8U TID (primarily 2 units, and Lantus 12U. Endorses compliance. Notes CBGs range 120's. This AM was 148 - notes that he didn't take the long acting last night. Endorses daily low blood sugars of 40-60, this happens mostly in late morning/afternoon. Highest in 180's usually in the evenings. He feels like he is battling a lot to keep his blood sugars up. Denies any polyuria, polydipsia, polyphagia. Due for diabetic eye exam. Last weight: 234lbs Current weight: 234.6lbs.  Lab Results  Component Value Date   HGBA1C 9.1 (H) 12/27/2020   HGBA1C 8.7 (A) 12/23/2020   HGBA1C 9.7 (A) 10/23/2020   Review of Systems:  Per HPI.   Objective:   BP (!) 127/94   Pulse 94   Wt 234 lb 9.6 oz (106.4 kg)   SpO2 100%   BMI  29.32 kg/m  Vitals and nursing note reviewed.  General: pleasant older male, sitting comfortably in exam chair, well nourished, well developed, in no acute distress with non-toxic appearance CV: regular rate and rhythm without murmurs, rubs, or gallops Lungs: clear to auscultation bilaterally with normal work of breathing on room air speaking in full sentences Skin: warm, dry, incision on left mid back is very well appearing and well healed, no open areas, no erythema, no warmth, no discharge. Area beneath large incision is also well healing with no erythema or warmth MSK:  Gait overall normal with cane Neuro: Alert and oriented, speech normal  Assessment & Plan:   Type 2 diabetes mellitus with diabetic neuropathy, with long-term current use of insulin (HCC) Chronic.  Weight now stabilizing. Has been having episodes of hypoglycemia daily. - decrease Lantus to 10 units - decrease Sliding scale Humalog to 2-6 units  - recommended to restart Metformin 1000mg  BID, patient amendable  - Continue Jardiance 25mg  QD and Januvia 100mg  QD - Plan is to transition from DDP-4 to GLP-1 given better evidence however would like for acute infections and weight to stabilize prior to transition - follow up 1 month for continued close monitoring - encouraged to keep blood sugar log  Vertebral osteomyelitis ([place]) Incision line well healing. Provided reassurance. Discussed return precautions.  Action tremor Patient notes that Propranolol 80mg  QD isnt as effective as before. Has not transitioned to Metoprolol yet.  Rx provided for Metoprolol given insurance wont cover Propranolol. Will have patient trial Metoprolol 100mg  QD (equivalent to  current Propranolol 80mg  QD). Can consider titrating up to 200mg  QD (equivalent to Propranolol 160mg  QD, recommended max dose for essential tremor is 320mg ) if HR and BP will allow. Can also consider adding or switching to Primidone. Already on gabapentin. If no improvement  with these, then recommend referral to neurology.  No orders of the defined types were placed in this encounter.  Meds ordered this encounter  Medications  . metoprolol succinate (TOPROL-XL) 100 MG 24 hr tablet    Sig: Take 1 tablet (100 mg total) by mouth at bedtime. Take with or immediately following a meal.    Dispense:  90 tablet    Refill:  3  . omeprazole (PRILOSEC) 40 MG capsule    Sig: Take 1 capsule (40 mg total) by mouth daily.    Dispense:  30 capsule    Refill:  [place], DO PGY-3, [facility] 02/14/2021 10:28 AM

## 2021-02-12 ENCOUNTER — Ambulatory Visit: Payer: 59 | Admitting: Family Medicine

## 2021-02-12 ENCOUNTER — Other Ambulatory Visit: Payer: Self-pay

## 2021-02-12 ENCOUNTER — Encounter: Payer: Self-pay | Admitting: Family Medicine

## 2021-02-12 ENCOUNTER — Other Ambulatory Visit (HOSPITAL_COMMUNITY): Payer: Self-pay

## 2021-02-12 VITALS — BP 127/94 | HR 94 | Wt 234.6 lb

## 2021-02-12 DIAGNOSIS — E1140 Type 2 diabetes mellitus with diabetic neuropathy, unspecified: Secondary | ICD-10-CM

## 2021-02-12 DIAGNOSIS — G252 Other specified forms of tremor: Secondary | ICD-10-CM | POA: Diagnosis not present

## 2021-02-12 DIAGNOSIS — K219 Gastro-esophageal reflux disease without esophagitis: Secondary | ICD-10-CM

## 2021-02-12 DIAGNOSIS — Z794 Long term (current) use of insulin: Secondary | ICD-10-CM

## 2021-02-12 DIAGNOSIS — M4620 Osteomyelitis of vertebra, site unspecified: Secondary | ICD-10-CM | POA: Diagnosis not present

## 2021-02-12 MED ORDER — METOPROLOL SUCCINATE ER 100 MG PO TB24
100.0000 mg | ORAL_TABLET | Freq: Every day | ORAL | 3 refills | Status: DC
  Filled 2021-02-12: qty 90, 90d supply, fill #0
  Filled 2021-04-24: qty 90, 90d supply, fill #1

## 2021-02-12 MED ORDER — OMEPRAZOLE 40 MG PO CPDR
40.0000 mg | DELAYED_RELEASE_CAPSULE | Freq: Every day | ORAL | 3 refills | Status: DC
  Filled 2021-02-12: qty 30, 30d supply, fill #0
  Filled 2021-03-14: qty 90, 90d supply, fill #1

## 2021-02-12 MED FILL — Gabapentin Cap 300 MG: ORAL | 30 days supply | Qty: 180 | Fill #1 | Status: AC

## 2021-02-12 MED FILL — Continuous Glucose System Transmitter: 90 days supply | Qty: 1 | Fill #0 | Status: CN

## 2021-02-12 NOTE — Patient Instructions (Signed)
Diabetes: - due for your diabetic eye exam. Schedule that at your convenience.  - Decrease Lantus to 10 units. Can decrease after 3-4 days to 8 units if still getting low blood sugars <60 - continue Humalog 2-6 units sliding scale  - Continue the Metformin, Jardiance and Januvia as prescribed - follow up 1 month for continued monitoring

## 2021-02-13 ENCOUNTER — Emergency Department (HOSPITAL_COMMUNITY)
Admission: EM | Admit: 2021-02-13 | Discharge: 2021-02-13 | Disposition: A | Discharge status: Home / Self Care | Payer: 59 | Attending: Emergency Medicine | Admitting: Emergency Medicine

## 2021-02-13 ENCOUNTER — Other Ambulatory Visit: Payer: Self-pay | Admitting: *Deleted

## 2021-02-13 ENCOUNTER — Telehealth: Payer: Self-pay

## 2021-02-13 ENCOUNTER — Encounter (HOSPITAL_COMMUNITY): Payer: Self-pay | Admitting: Emergency Medicine

## 2021-02-13 DIAGNOSIS — E1140 Type 2 diabetes mellitus with diabetic neuropathy, unspecified: Secondary | ICD-10-CM | POA: Diagnosis not present

## 2021-02-13 DIAGNOSIS — R21 Rash and other nonspecific skin eruption: Secondary | ICD-10-CM | POA: Diagnosis not present

## 2021-02-13 DIAGNOSIS — Z96651 Presence of right artificial knee joint: Secondary | ICD-10-CM | POA: Diagnosis not present

## 2021-02-13 DIAGNOSIS — Z794 Long term (current) use of insulin: Secondary | ICD-10-CM | POA: Diagnosis not present

## 2021-02-13 DIAGNOSIS — Z7984 Long term (current) use of oral hypoglycemic drugs: Secondary | ICD-10-CM | POA: Insufficient documentation

## 2021-02-13 DIAGNOSIS — I10 Essential (primary) hypertension: Secondary | ICD-10-CM | POA: Diagnosis not present

## 2021-02-13 DIAGNOSIS — Z79899 Other long term (current) drug therapy: Secondary | ICD-10-CM | POA: Insufficient documentation

## 2021-02-13 DIAGNOSIS — Z95828 Presence of other vascular implants and grafts: Secondary | ICD-10-CM | POA: Diagnosis not present

## 2021-02-13 DIAGNOSIS — F17210 Nicotine dependence, cigarettes, uncomplicated: Secondary | ICD-10-CM | POA: Insufficient documentation

## 2021-02-13 DIAGNOSIS — J4520 Mild intermittent asthma, uncomplicated: Secondary | ICD-10-CM | POA: Diagnosis not present

## 2021-02-13 DIAGNOSIS — Z7951 Long term (current) use of inhaled steroids: Secondary | ICD-10-CM | POA: Insufficient documentation

## 2021-02-13 DIAGNOSIS — M869 Osteomyelitis, unspecified: Secondary | ICD-10-CM | POA: Diagnosis not present

## 2021-02-13 DIAGNOSIS — Z7689 Persons encountering health services in other specified circumstances: Secondary | ICD-10-CM | POA: Diagnosis not present

## 2021-02-13 DIAGNOSIS — E785 Hyperlipidemia, unspecified: Secondary | ICD-10-CM | POA: Diagnosis not present

## 2021-02-13 DIAGNOSIS — Z452 Encounter for adjustment and management of vascular access device: Secondary | ICD-10-CM | POA: Diagnosis not present

## 2021-02-13 DIAGNOSIS — E1169 Type 2 diabetes mellitus with other specified complication: Secondary | ICD-10-CM | POA: Diagnosis not present

## 2021-02-13 DIAGNOSIS — M4644 Discitis, unspecified, thoracic region: Secondary | ICD-10-CM | POA: Diagnosis not present

## 2021-02-13 DIAGNOSIS — M86151 Other acute osteomyelitis, right femur: Secondary | ICD-10-CM | POA: Diagnosis not present

## 2021-02-13 DIAGNOSIS — M4624 Osteomyelitis of vertebra, thoracic region: Secondary | ICD-10-CM | POA: Diagnosis not present

## 2021-02-13 MED ORDER — HEPARIN SOD (PORK) LOCK FLUSH 100 UNIT/ML IV SOLN
250.0000 [IU] | INTRAVENOUS | Status: AC | PRN
  Administered 2021-02-13: 250 [IU]
  Filled 2021-02-13: qty 2.5

## 2021-02-13 NOTE — Assessment & Plan Note (Addendum)
Chronic.  Weight now stabilizing. Has been having episodes of hypoglycemia daily. - decrease Lantus to 10 units - decrease Sliding scale Humalog to 2-6 units  - recommended to restart Metformin 1000mg  BID, patient amendable  - Continue Jardiance 25mg  QD and Januvia 100mg  QD - Plan is to transition from DDP-4 to GLP-1 given better evidence however would like for acute infections and weight to stabilize prior to transition - follow up 1 month for continued close monitoring - encouraged to keep blood sugar log

## 2021-02-13 NOTE — Telephone Encounter (Signed)
Received call from [facility] team regarding patient's picc line. Nurse states line is very sluggish, with no blood return. Picc is flushing well, patient denies any pain at insertion site. Per Nurse patient had cathflow roughly x3 since hospital discharge. And this has been an ongoing issue with picc line. Advise patient to have picc evaluated at ED. Patient agrees as he will need to continue therapy (vancomycin) through 02/25/21 for MRSA bacteremia with osteomyelitis and discitis. Routing to provide to make aware. Patient to follow up with ID on 02/21/21 Eugenia Mcalpine

## 2021-02-13 NOTE — Progress Notes (Signed)
RUA SL PICC dressing dry and intact. Able to withdraw approx 2-3 ml blood with some effort. Easier to draw with arm extended and deep breathe. Capped and flushed with Heparin 250 units

## 2021-02-13 NOTE — Patient Outreach (Addendum)
[facility] Care Management  02/13/2021  Christopher Fernandez Feb 09, 1971 [id number]   Transition of care /follow up call   Referral received:01/17/21 Initial outreach:01/21/21 Insurance: [facility]  Subjective; Successful outreach call to patient, he explained just getting call from MD office with recommendation to go to ED to have PICC line evaluated. He discussed issue with PICC  line getting clogged up.  He confirmed that his wife is with him and able to provide transportation. Additional call assessment not completed at this time.   [facility] forOsteomyelitis with new abscess/phlegmon status post T8-11 lateral fusion and T9-10 corpectomy Right loculated pleural effusion status post thoracotomy, Osteomyelitis right hip/pubisComorbidities include: Type 2 Diabetes, peripheral neuropathy , chronic pancreatitis/pancreatic insufficiency, hyperlipidemia hypertension , amputation left first toe, right metatarsal amputation. He was discharged to home on5/6/22 with [facility] services RN, PT/OT, [facility] IV antibiotic home infusion and with DME of Tub bench and 3 in 1.  Positive home covid test on 01/27/21 symptoms starting 5/13.   Plan Patient agreeable to complete  transition of care follow up call in the next week.    Joylene Draft, RN, BSN  [facility] Management Coordinator  [phone]- Mobile [phone]- Toll Free Main Office

## 2021-02-13 NOTE — ED Triage Notes (Signed)
Patient sent to ED for evaluation of PICC line, states he has the PICC for home antibiotics (for osteomyelitis) and the home health nurse told him that the line infuses fine but will not give blood return. Patient alert, oriented, and in no apparent distress.

## 2021-02-13 NOTE — ED Provider Notes (Signed)
Emergency Medicine Provider Triage Evaluation Note  Christopher Fernandez , a 50 y.o. male  was evaluated in triage.  Pt complains of issues with his PICC line.  States that he has been receiving vancomycin infusions at home.  States that ever since he had his PICC line placed it is giving him trouble giving blood return.  He was told by Dr. Drucilla Schmidt to come to the ER to have it evaluated.  He denies any other complaints.  Review of Systems  Positive: PICC line problem Negative: Fever, pain  Physical Exam  BP 117/85   Pulse 94   Temp 98.8 F (37.1 C) (Oral)   Resp 16   SpO2 100%  Gen:   Awake, no distress   Resp:  Normal effort  MSK:   Moves extremities without difficulty  Other:  PICC line in right upper extremity  Medical Decision Making  Medically screening exam initiated at 12:18 PM.  Appropriate orders placed.  [name] was informed that the remainder of the evaluation will be completed by another provider, this initial triage assessment does not replace that evaluation, and the importance of remaining in the ED until their evaluation is complete.  No labs indicated at this time   Delia Heady, Hershal Coria 02/13/21 1219    Pattricia Boss, MD 02/17/21 1341

## 2021-02-13 NOTE — Telephone Encounter (Signed)
Per [facility] pain management he has been denied care.  They have already informed the patient of this information

## 2021-02-13 NOTE — ED Notes (Signed)
Pt's PICC dressing appears clean, dry and intact. Pt's PICC flushes well with no resistance. Not able to pull blood off of site.

## 2021-02-13 NOTE — Discharge Instructions (Addendum)
PICC line flushing well.  Please follow up with IR for replacement if needed.

## 2021-02-13 NOTE — ED Provider Notes (Signed)
[facility] DEPARTMENT Provider Note   CSN: [id number] Arrival date & time: 02/13/21  1210     History Chief Complaint  Patient presents with  . Vascular Access Problem    GRACE HAGGART is a 50 y.o. male.  HPI  50 year old male history of type 2 diabetes, COPD, history of epidural abscess and osteomyelitis of his foot sent to ED for PICC line that they were unable to draw blood back from.  Patient states he has been getting his vancomycin and gives it to himself.  He has had it this morning and there is no difficulty with low through his PICC line.  However, his nurse came out to draw labs today.  She was unable to get blood back through his PICC line although she drew it peripherally.  She sent him to the ED for evaluation of this PICC line.  Patient has no other complaints.  He does not have any pain redness swelling around the area.  He reports no change in his back pain or his foot.  He denies any other medical concerns.     Past Medical History:  Diagnosis Date  . Alcoholism ([place])   . Anemia   . Anxiety   . Arthritis   . Asthma   . Blood transfusion without reported diagnosis   . COPD (chronic obstructive pulmonary disease) ([place])   . Depression   . Diabetes ([place])    type 2  . GERD (gastroesophageal reflux disease)   . History of hiatal hernia   . HTN (hypertension)   . MRSA bacteremia   . MRSA infection 01/11/2020  . Neuromuscular disorder (HCC)    tremors  . Osteomyelitis ([place])    right forefoot  . Pancreatitis   . Pneumonia   . Substance abuse ([place])   . 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 Wears glasses     Patient Active Problem List   Diagnosis Date Noted  . Osteomyelitis of third toe of left foot ([place]) 02/11/2021  . Thrombocytosis   . Previous back surgery   . Constipation   . S/P thoracotomy   . Epidural abscess   . Biliary sludge   . Choledocholithiasis   . Infective myositis of right lower extremity   . Acute  osteomyelitis of right pelvic region and thigh ([place])   . Discitis of thoracolumbar region   . Pericardial effusion   . MRSA bacteremia   . Right hip pain   . Osteomyelitis of second toe of left foot ([place]) 11/29/2020  . Chronic pancreatitis ([place]) 10/27/2020  . Abnormal ankle brachial index (ABI)   . Diabetic foot infection ([place])   . Vertebral osteomyelitis ([place])   . 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 Hyperlipidemia associated with type 2 diabetes mellitus ([place]) 03/15/2020  . MRSA infection 01/11/2020  . 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[place]) 01/10/2020  . S/P transmetatarsal amputation of foot, right ([place]) 01/09/2020  . Asplenia 12/13/2019  . Exocrine pancreatic insufficiency 06/14/2018  . Type 2 diabetes mellitus with diabetic neuropathy, with long-term current use of insulin ([place]) 02/24/2018  . Alcohol use disorder, severe, dependence ([place]) 02/24/2018  . Anxiety associated with depression 02/24/2018  . GERD (gastroesophageal reflux disease) 02/24/2018  . Asthma, intermittent 02/24/2018  . Tobacco use disorder 02/24/2018  . Benign prostatic hyperplasia 12/30/2015  . Chronic obstructive pulmonary disease ([place]) 12/30/2015  . Diabetic neuropathy ([place]) 12/30/2015  . Restless legs syndrome 12/30/2015  . Long term current use of insulin ([place]) 08/05/2015  .  ED (erectile dysfunction) of organic origin 05/23/2015  . Benign essential hypertension 02/16/2014  . Extrinsic asthma without status asthmaticus 02/16/2014    Past Surgical History:  Procedure Laterality Date  . 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 AMPUTATION Right 10/18/2019   Procedure: RIGHT SECOND TOE AMPUTATION;  Surgeon: Newt Minion, MD;  Location: [facility];  Service: Orthopedics;  Laterality: Right;  . AMPUTATION Right 11/08/2019   Procedure: RIGHT TRANSMETATARSAL AMPUTATION;  Surgeon: Newt Minion, MD;  Location: [facility];  Service: Orthopedics;  Laterality: Right;  . AMPUTATION Left 12/02/2020   Procedure: AMPUTATION 2ND TOE;   Surgeon: Elam Dutch, MD;  Location: [facility];  Service: Vascular;  Laterality: Left;  . APPLICATION OF WOUND VAC Right 01/10/2020   Procedure: APPLICATION OF WOUND VAC, right foot;  Surgeon: [name], MD;  Location: [facility];  Service: Vascular;  Laterality: Right;  . BILIARY DILATION  12/06/2020   Procedure: BILIARY DILATION;  Surgeon: Rush Landmark Telford Nab., MD;  Location: [facility];  Service: Gastroenterology;;  . BIOPSY  12/06/2020   Procedure: BIOPSY;  Surgeon: Irving Copas., MD;  Location: [facility] ENDOSCOPY;  Service: Gastroenterology;;  . [facility]in [facility]  2006   with gallbladder and spleen  . COLONOSCOPY  8-10 years ago    in [place] exam per pt  . ENDOSCOPIC RETROGRADE CHOLANGIOPANCREATOGRAPHY (ERCP) WITH PROPOFOL N/A 12/06/2020   Procedure: ENDOSCOPIC RETROGRADE CHOLANGIOPANCREATOGRAPHY (ERCP) WITH PROPOFOL;  Surgeon: Rush Landmark Telford Nab., MD;  Location: [facility];  Service: Gastroenterology;  Laterality: N/A;  . HERNIA REPAIR  2008, 2010   hiatal hernia and 1 additional  . NASAL SEPTUM SURGERY    . PANCREATIC PSEUDOCYST DRAINAGE    . PANCREATIC STENT PLACEMENT  12/06/2020   Procedure: PANCREATIC STENT PLACEMENT;  Surgeon: Rush Landmark Telford Nab., MD;  Location: [facility];  Service: Gastroenterology;;  . PLEURAL EFFUSION DRAINAGE Right 01/10/2021   Procedure: DRAINAGE OF PLEURAL EFFUSION;  Surgeon: Gaye Pollack, MD;  Location: [facility];  Service: Thoracic;  Laterality: Right;  . REMOVAL OF STONES  12/06/2020   Procedure: REMOVAL OF STONES;  Surgeon: Irving Copas., MD;  Location: [facility];  Service: Gastroenterology;;  . [name]  12/06/2020   Procedure: [name];  Surgeon: Irving Copas., MD;  Location: [facility];  Service: Gastroenterology;;  . SPLENECTOMY  2006  . STUMP REVISION Right 11/24/2019   Procedure: REVISION RIGHT TRANSMETATARSAL AMPUTATION;  Surgeon: Newt Minion, MD;  Location: [facility];  Service:  Orthopedics;  Laterality: Right;  . TEE WITHOUT CARDIOVERSION N/A 12/04/2020   Procedure: TRANSESOPHAGEAL ECHOCARDIOGRAM (TEE);  Surgeon: Acie Fredrickson Wonda Cheng, MD;  Location: [facility] ENDOSCOPY;  Service: Cardiovascular;  Laterality: N/A;  . THORACIC EXPOSURE Right 01/03/2021   Procedure: THORACIC EXPOSURE with removal of rib;  Surgeon: Gaye Pollack, MD;  Location: [facility];  Service: Cardiothoracic;  Laterality: Right;  . THORACOTOMY Right 01/10/2021   Procedure: REDO THORACOTOMY;  Surgeon: Gaye Pollack, MD;  Location: [facility];  Service: Thoracic;  Laterality: Right;  . [place] VERTEBRECTOMY Right 01/03/2021   Procedure: Decompression of  Thoracic nine and Thoracic ten via corpectomy reconstruction with titanium spacer and rib autogrtaft. Lateral plate fixation Thoracic eight - Thoracic eleven;  Surgeon: Kristeen Miss, MD;  Location: [facility];  Service: Neurosurgery;  Laterality: Right;  . TOTAL KNEE ARTHROPLASTY Right 1982, 1984  . TRANSMETATARSAL AMPUTATION Left 09/06/2020   Procedure: AMPUTATION LEFT GREAT TOE;  Surgeon: Waynetta Sandy, MD;  Location: [facility];  Service:  Vascular;  Laterality: Left;  . WOUND DEBRIDEMENT Right 01/10/2020   Procedure: incisional DEBRIDEMENT of RIGHT TRANSMETATARSAL WOUND;  Surgeon: [name], MD;  Location: [facility] OR;  Service: Vascular;  Laterality: Right;       Family History  Problem Relation Age of Onset  . Diabetes Mother   . Hypertension Mother   . Hyperlipidemia Mother   . Kidney disease Mother   . Thyroid disease Mother   . Breast cancer Mother        mets  . Lung cancer Mother   . Diabetes Father   . Alcohol abuse Sister   . Sickle cell trait Sister   . Diabetes Brother   . Asthma Brother   . Lung cancer Maternal Grandmother   . Kidney disease Maternal Grandmother   . Liver cancer Maternal Uncle        x 4-5  . Kidney disease Maternal Uncle   . Kidney disease Paternal Uncle   . Colon cancer Neg Hx   . Colon polyps Neg Hx   .  Esophageal cancer Neg Hx   . Rectal cancer Neg Hx   . Stomach cancer Neg Hx     Social History   Tobacco Use  . Smoking status: Current Every Day Smoker    Years: 38.00    Types: Cigarettes    Start date: 09/14/1982  . Smokeless tobacco: Never Used  . Tobacco comment: 2-3 cigarettes a day  Vaping Use  . Vaping Use: Never used  Substance Use Topics  . Alcohol use: Not Currently    Comment: 2-3 per day, none since 2019  . Drug use: Not Currently    Home Medications Prior to Admission medications   Medication Sig Start Date End Date Taking? Authorizing Provider  acetaminophen (TYLENOL) 500 MG tablet Take 2 tablets (1,000 mg total) by mouth every 6 (six) hours. 01/17/21   Gifford Shave, MD  albuterol (VENTOLIN HFA) 108 (90 Base) MCG/ACT inhaler INHALE 2 TO 4 PUFFS INTO THE LUNGS EVERY 4 HOURS AS NEEDED FOR WHEEZING OR COUGH. Patient taking differently: Inhale 2-4 puffs into the lungs every 4 (four) hours as needed for wheezing or shortness of breath. 11/20/20 11/20/21  Mullis, Kiersten P, DO  ARIPiprazole (ABILIFY) 5 MG tablet TAKE 1 TABLET (5 MG TOTAL) BY MOUTH DAILY. Patient taking differently: Take 5 mg by mouth every evening. 11/05/20 11/05/21  Mullis, Kiersten P, DO  atorvastatin (LIPITOR) 40 MG tablet Take 1 tablet (40 mg total) by mouth daily. 12/23/20   Mullis, Kiersten P, DO  baclofen (LIORESAL) 10 MG tablet Take 1 tablet (10 mg total) by mouth 3 (three) times daily as needed for muscle spasms. 01/20/21   Mullis, Kiersten P, DO  bismuth subsalicylate (PEPTO BISMOL) 262 MG/15ML suspension Take 30 mLs by mouth every 6 (six) hours as needed for indigestion or diarrhea or loose stools.    [provider]  cetirizine (ZYRTEC) 10 MG tablet Take 1 tablet (10 mg total) by mouth daily. Patient taking differently: Take 10 mg by mouth daily as needed for allergies. 10/29/20   Zenia Resides, MD  Continuous Blood Gluc Receiver (DEXCOM G6 RECEIVER) DEVI 1 Device by Does not apply route 4  (four) times daily. 03/15/20   Mullis, Kiersten P, DO  Continuous Blood Gluc Transmit (DEXCOM G6 TRANSMITTER) MISC 4 (four) times daily. as directed 09/09/20   [provider]  Continuous Blood Gluc Transmit (DEXCOM G6 TRANSMITTER) MISC USE AS DIRECTED FOUR TIMES DAILY 03/15/20 05/13/21  Mullis, Kiersten P, DO  cyclobenzaprine (FLEXERIL) 10 MG tablet Take 1 tablet (10 mg total) by mouth 3 (three) times daily. 02/07/21     DULoxetine (CYMBALTA) 60 MG capsule TAKE 1 CAPSULE (60 MG TOTAL) BY MOUTH DAILY. Patient taking differently: Take 60 mg by mouth daily. 11/05/20 11/05/21  Mullis, Kiersten P, DO  EASY TOUCH PEN NEEDLES 31G X 5 MM MISC Inject 1 application as directed 3 (three) times daily. 10/27/19   Mullis, Kiersten P, DO  empagliflozin (JARDIANCE) 25 MG TABS tablet TAKE 1 TABLET BY MOUTH DAILY. Patient taking differently: Take 25 mg by mouth daily. 11/05/20 11/05/21  Mullis, Kiersten P, DO  Fluticasone-Salmeterol (ADVAIR) 250-50 MCG/DOSE AEPB INHALE 1 PUFF INTO THE LUNGS 2 TIMES DAILY. Patient taking differently: Inhale 1 puff into the lungs in the morning and at bedtime. 11/20/20 11/20/21  Mullis, Kiersten P, DO  gabapentin (NEURONTIN) 300 MG capsule TAKE 2 CAPSULES (600 MG TOTAL) BY MOUTH 3 (THREE) TIMES DAILY. Patient taking differently: Take 600 mg by mouth 3 (three) times daily. 11/13/20 11/13/21  Mullis, Kiersten P, DO  glucose blood test strip USE AS INSTRUCTED 05/14/20 05/14/21  Mullis, Kiersten P, DO  ibuprofen (ADVIL) 400 MG tablet Take 1 tablet (400 mg total) by mouth every 8 (eight) hours as needed for mild pain. 01/17/21 01/17/22  Gifford Shave, MD  insulin lispro (HUMALOG) 100 UNIT/ML KwikPen INJECT 6-10 UNITS INTO THE SKIN 3 (THREE) TIMES DAILY. Patient taking differently: Inject 6-10 Units into the skin 3 (three) times daily. Sliding scale not provided 11/05/20 11/05/21  Mina Marble P, DO  Lancets (FREESTYLE) lancets Use as instructed 03/29/18   Harriet Butte, DO  lidocaine (LIDODERM) 5 %  PLACE 1 PATCH ONTO THE SKIN DAILY. REMOVE & DISCARD PATCH WITHIN 12 HOURS OR AS DIRECTED BY MD Patient taking differently: Place 1 patch onto the skin daily. Remove & discard patch within 12 hours or as directed by MD 12/11/20 12/11/21  Martyn Malay, MD  metFORMIN (GLUCOPHAGE) 1000 MG tablet TAKE 1 TABLET (1,000 MG TOTAL) BY MOUTH 2 (TWO) TIMES DAILY WITH A MEAL. Patient taking differently: Take 1,000 mg by mouth 2 (two) times daily with a meal. 06/11/20 06/11/21  Mullis, Kiersten P, DO  metoprolol succinate (TOPROL-XL) 100 MG 24 hr tablet Take 1 tablet (100 mg total) by mouth at bedtime. Take with or immediately following a meal. 02/12/21   Mullis, Kiersten P, DO  mirtazapine (REMERON) 30 MG tablet TAKE 1 TABLET (30 MG TOTAL) BY MOUTH AT BEDTIME. Patient taking differently: Take 30 mg by mouth at bedtime. 12/12/20 12/12/21  Mullis, Kiersten P, DO  montelukast (SINGULAIR) 10 MG tablet TAKE 1 TABLET (10 MG TOTAL) BY MOUTH AT BEDTIME. Patient taking differently: Take 10 mg by mouth in the morning. 11/05/20 11/05/21  Mullis, Kiersten P, DO  omeprazole (PRILOSEC) 40 MG capsule Take 1 capsule (40 mg total) by mouth daily. 02/12/21   Mullis, Kiersten P, DO  ondansetron (ZOFRAN ODT) 4 MG disintegrating tablet Take 1 tablet (4 mg total) by mouth every 8 (eight) hours as needed for nausea or vomiting. 01/17/21   Gifford Shave, MD  oxyCODONE-acetaminophen (PERCOCET) 10-325 MG tablet Take 1 tablet by mouth every 6 (six) hours as needed. 02/07/21     PANCRELIPASE, LIP-PROT-AMYL, PO Take 36,000 Units by mouth with breakfast, with lunch, and with evening meal.    [provider]  rifampin (RIFADIN) 300 MG capsule Take 1 capsule (300 mg total) by mouth 2 (two) times daily. 01/17/21 03/08/21  Gifford Shave, MD  senna-docusate (SENOKOT-S) 8.6-50 MG tablet Take 1 tablet by mouth at bedtime. 01/17/21   Gifford Shave, MD  sitaGLIPtin (JANUVIA) 100 MG tablet TAKE 1 TABLET (100 MG TOTAL) BY MOUTH DAILY. Patient taking  differently: Take 100 mg by mouth daily. 11/05/20 11/05/21  Mullis, Kiersten P, DO  vancomycin HCl (VANCOREADY) 1500 MG/300ML SOLN Inject 300 mLs (1,500 mg total) into the vein every 12 (twelve) hours. 01/17/21 03/08/21  Gifford Shave, MD  vancomycin IVPB Inject 1,500 mg into the vein every 12 (twelve) hours. Indication:  Disseminated MRSA infection  First Dose: No Last Day of Therapy:  02/25/2021 Labs - Sunday/Monday:  CBC/D, BMP, and vancomycin trough. Labs - Thursday:  BMP and vancomycin trough Labs - Every other week:  ESR and CRP Method of administration:Elastomeric Method of administration may be changed at the discretion of the patient and/or caregiver's ability to self-administer the medication ordered. 01/17/21 03/01/21  Gifford Shave, MD    Allergies    Eggs or egg-derived products, Morphine and related, and Cocoa  Review of Systems   Review of Systems  All other systems reviewed and are negative.   Physical Exam Updated Vital Signs BP 124/81   Pulse 87   Temp 98.8 F (37.1 C) (Oral)   Resp 17   SpO2 96%   Physical Exam Vitals and nursing note reviewed.  Constitutional:      Appearance: He is well-developed.  HENT:     Head: Normocephalic and atraumatic.     Right Ear: External ear normal.     Left Ear: External ear normal.     Nose: Nose normal.  Neck:     Trachea: No tracheal deviation.  Pulmonary:     Effort: Pulmonary effort is normal.  Musculoskeletal:        General: Normal range of motion.     Comments: Right upper extremity with PICC line in place without any tenderness, warmth, or erythema  Skin:    General: Skin is warm and dry.  Neurological:     Mental Status: He is alert and oriented to person, place, and time.  Psychiatric:        Behavior: Behavior normal.     ED Results / Procedures / Treatments   Labs (all labs ordered are listed, but only abnormal results are displayed) Labs Reviewed - No data to display  EKG None  Radiology No  results found.  Procedures Procedures   Medications Ordered in ED Medications - No data to display  ED Course  I have reviewed the triage vital signs and the nursing notes.  Pertinent labs & imaging results that were available during my care of the patient were reviewed by me and considered in my medical decision making (see chart for details).    MDM Rules/Calculators/A&P                          Patient with PICC line right upper extremity through which he is receiving vancomycin.  PICC line flushing pain well here in the ED.  IV therapy saw and assessed and were actually able to draw blood back.  Patient appears stable for discharge. Final Clinical Impression(s) / ED Diagnoses Final diagnoses:  Status post PICC central line placement    Rx / DC Orders ED Discharge Orders    None       Pattricia Boss, MD 02/13/21 1620

## 2021-02-14 ENCOUNTER — Other Ambulatory Visit (HOSPITAL_COMMUNITY): Payer: Self-pay

## 2021-02-14 NOTE — Assessment & Plan Note (Signed)
Incision line well healing. Provided reassurance. Discussed return precautions.

## 2021-02-14 NOTE — Assessment & Plan Note (Addendum)
Patient notes that Propranolol 80mg  QD isnt as effective as before. Has not transitioned to Metoprolol yet.  Rx provided for Metoprolol given insurance wont cover Propranolol. Will have patient trial Metoprolol 100mg  QD (equivalent to current Propranolol 80mg  QD). Can consider titrating up to 200mg  QD (equivalent to Propranolol 160mg  QD, recommended max dose for essential tremor is 320mg ) if HR and BP will allow. Can also consider adding or switching to Primidone. Already on gabapentin. If no improvement with these, then recommend referral to neurology.

## 2021-02-15 DIAGNOSIS — S98011A Complete traumatic amputation of right foot at ankle level, initial encounter: Secondary | ICD-10-CM | POA: Diagnosis not present

## 2021-02-15 DIAGNOSIS — A327 Listerial sepsis: Secondary | ICD-10-CM | POA: Diagnosis not present

## 2021-02-15 DIAGNOSIS — A4902 Methicillin resistant Staphylococcus aureus infection, unspecified site: Secondary | ICD-10-CM | POA: Diagnosis not present

## 2021-02-15 DIAGNOSIS — L02611 Cutaneous abscess of right foot: Secondary | ICD-10-CM | POA: Diagnosis not present

## 2021-02-17 ENCOUNTER — Other Ambulatory Visit: Payer: Self-pay

## 2021-02-17 ENCOUNTER — Ambulatory Visit (HOSPITAL_COMMUNITY)
Admission: RE | Admit: 2021-02-17 | Discharge: 2021-02-17 | Disposition: A | Discharge status: Home / Self Care | Payer: 59 | Source: Ambulatory Visit | Attending: Infectious Disease | Admitting: Infectious Disease

## 2021-02-17 DIAGNOSIS — M86151 Other acute osteomyelitis, right femur: Secondary | ICD-10-CM | POA: Diagnosis not present

## 2021-02-17 DIAGNOSIS — M8609 Acute hematogenous osteomyelitis, multiple sites: Secondary | ICD-10-CM | POA: Diagnosis not present

## 2021-02-17 DIAGNOSIS — D1800 Hemangioma unspecified site: Secondary | ICD-10-CM | POA: Diagnosis not present

## 2021-02-17 DIAGNOSIS — M25551 Pain in right hip: Secondary | ICD-10-CM | POA: Diagnosis not present

## 2021-02-17 DIAGNOSIS — Z8619 Personal history of other infectious and parasitic diseases: Secondary | ICD-10-CM | POA: Diagnosis not present

## 2021-02-17 MED ORDER — GADOBUTROL 1 MMOL/ML IV SOLN
10.0000 mL | Freq: Once | INTRAVENOUS | Status: AC | PRN
  Administered 2021-02-17: 10 mL via INTRAVENOUS

## 2021-02-18 DIAGNOSIS — M4644 Discitis, unspecified, thoracic region: Secondary | ICD-10-CM | POA: Diagnosis not present

## 2021-02-18 DIAGNOSIS — M4624 Osteomyelitis of vertebra, thoracic region: Secondary | ICD-10-CM | POA: Diagnosis not present

## 2021-02-18 DIAGNOSIS — E785 Hyperlipidemia, unspecified: Secondary | ICD-10-CM | POA: Diagnosis not present

## 2021-02-18 DIAGNOSIS — E1169 Type 2 diabetes mellitus with other specified complication: Secondary | ICD-10-CM | POA: Diagnosis not present

## 2021-02-18 DIAGNOSIS — M86151 Other acute osteomyelitis, right femur: Secondary | ICD-10-CM | POA: Diagnosis not present

## 2021-02-19 DIAGNOSIS — M86151 Other acute osteomyelitis, right femur: Secondary | ICD-10-CM | POA: Diagnosis not present

## 2021-02-19 DIAGNOSIS — M4624 Osteomyelitis of vertebra, thoracic region: Secondary | ICD-10-CM | POA: Diagnosis not present

## 2021-02-19 DIAGNOSIS — E785 Hyperlipidemia, unspecified: Secondary | ICD-10-CM | POA: Diagnosis not present

## 2021-02-19 DIAGNOSIS — E1169 Type 2 diabetes mellitus with other specified complication: Secondary | ICD-10-CM | POA: Diagnosis not present

## 2021-02-19 DIAGNOSIS — M4644 Discitis, unspecified, thoracic region: Secondary | ICD-10-CM | POA: Diagnosis not present

## 2021-02-20 ENCOUNTER — Other Ambulatory Visit (HOSPITAL_COMMUNITY): Payer: Self-pay

## 2021-02-20 ENCOUNTER — Telehealth: Payer: Self-pay

## 2021-02-20 ENCOUNTER — Ambulatory Visit: Payer: Self-pay | Admitting: *Deleted

## 2021-02-20 NOTE — Telephone Encounter (Signed)
Received fax from pharmacy, PA needed on [employer]. Clinical questions submitted via Cover My Meds. Waiting on response, could take up to 72 hours.  Cover My Meds info: Key: [id number]

## 2021-02-21 ENCOUNTER — Ambulatory Visit (INDEPENDENT_AMBULATORY_CARE_PROVIDER_SITE_OTHER): Payer: 59 | Admitting: Infectious Disease

## 2021-02-21 ENCOUNTER — Telehealth: Payer: Self-pay | Admitting: *Deleted

## 2021-02-21 ENCOUNTER — Other Ambulatory Visit: Payer: Self-pay

## 2021-02-21 ENCOUNTER — Other Ambulatory Visit (HOSPITAL_COMMUNITY): Payer: Self-pay

## 2021-02-21 ENCOUNTER — Encounter: Payer: Self-pay | Admitting: Infectious Disease

## 2021-02-21 ENCOUNTER — Encounter (HOSPITAL_COMMUNITY): Payer: Self-pay | Admitting: Vascular Surgery

## 2021-02-21 ENCOUNTER — Telehealth: Payer: Self-pay

## 2021-02-21 VITALS — BP 127/88 | HR 84 | Resp 16 | Ht 75.0 in | Wt 241.0 lb

## 2021-02-21 DIAGNOSIS — E11628 Type 2 diabetes mellitus with other skin complications: Secondary | ICD-10-CM | POA: Diagnosis not present

## 2021-02-21 DIAGNOSIS — M4620 Osteomyelitis of vertebra, site unspecified: Secondary | ICD-10-CM

## 2021-02-21 DIAGNOSIS — A4902 Methicillin resistant Staphylococcus aureus infection, unspecified site: Secondary | ICD-10-CM

## 2021-02-21 DIAGNOSIS — I3139 Other pericardial effusion (noninflammatory): Secondary | ICD-10-CM

## 2021-02-21 DIAGNOSIS — Z9889 Other specified postprocedural states: Secondary | ICD-10-CM

## 2021-02-21 DIAGNOSIS — Z23 Encounter for immunization: Secondary | ICD-10-CM | POA: Diagnosis not present

## 2021-02-21 DIAGNOSIS — I313 Pericardial effusion (noninflammatory): Secondary | ICD-10-CM

## 2021-02-21 DIAGNOSIS — L089 Local infection of the skin and subcutaneous tissue, unspecified: Secondary | ICD-10-CM

## 2021-02-21 DIAGNOSIS — U071 COVID-19: Secondary | ICD-10-CM

## 2021-02-21 DIAGNOSIS — M86151 Other acute osteomyelitis, right femur: Secondary | ICD-10-CM

## 2021-02-21 DIAGNOSIS — M60003 Infective myositis, unspecified right leg: Secondary | ICD-10-CM

## 2021-02-21 DIAGNOSIS — G062 Extradural and subdural abscess, unspecified: Secondary | ICD-10-CM

## 2021-02-21 DIAGNOSIS — Z89431 Acquired absence of right foot: Secondary | ICD-10-CM

## 2021-02-21 DIAGNOSIS — B9562 Methicillin resistant Staphylococcus aureus infection as the cause of diseases classified elsewhere: Secondary | ICD-10-CM

## 2021-02-21 DIAGNOSIS — M869 Osteomyelitis, unspecified: Secondary | ICD-10-CM | POA: Diagnosis not present

## 2021-02-21 DIAGNOSIS — D849 Immunodeficiency, unspecified: Secondary | ICD-10-CM

## 2021-02-21 DIAGNOSIS — R7881 Bacteremia: Secondary | ICD-10-CM | POA: Diagnosis not present

## 2021-02-21 DIAGNOSIS — R222 Localized swelling, mass and lump, trunk: Secondary | ICD-10-CM

## 2021-02-21 HISTORY — DX: COVID-19: U07.1

## 2021-02-21 HISTORY — DX: Localized swelling, mass and lump, trunk: R22.2

## 2021-02-21 MED ORDER — OXYCODONE-ACETAMINOPHEN 10-325 MG PO TABS
1.0000 | ORAL_TABLET | Freq: Three times a day (TID) | ORAL | 0 refills | Status: DC | PRN
  Filled 2021-02-21: qty 50, 17d supply, fill #0

## 2021-02-21 MED ORDER — RIFAMPIN 300 MG PO CAPS
300.0000 mg | ORAL_CAPSULE | Freq: Two times a day (BID) | ORAL | 11 refills | Status: DC
  Filled 2021-02-21: qty 60, 30d supply, fill #0
  Filled 2021-03-24: qty 60, 30d supply, fill #1
  Filled 2021-05-02: qty 60, 30d supply, fill #2

## 2021-02-21 MED ORDER — DOXYCYCLINE HYCLATE 100 MG PO TABS
100.0000 mg | ORAL_TABLET | Freq: Two times a day (BID) | ORAL | 11 refills | Status: DC
  Filled 2021-02-21: qty 60, 30d supply, fill #0
  Filled 2021-05-02: qty 60, 30d supply, fill #1

## 2021-02-21 MED FILL — Continuous Glucose System Transmitter: 1 days supply | Qty: 1 | Fill #0 | Status: CN

## 2021-02-21 NOTE — Telephone Encounter (Signed)
Patient called to follow up on Chest CT that Dr Tommy Medal ordered during his visit 02/21/21.  CT has been ordered, is in work queue. RN advised that the referral coordinator would be checking with insurance, then following up with patient to schedule the test once it has been approved. Will 'cc Margaret for follow up. Landis Gandy, RN

## 2021-02-21 NOTE — Telephone Encounter (Signed)
Christopher Fernandez at [facility] that due to surgery on 02/24/2021 patient will skip Abx and they should go back Tuesday to administer abx and PULL PICC after last dose on Wednesday 02/26/2021 since missed dose on Monday 02/24/2021.  Original Last Dose - 02/25/2021  New Last Dose -02/26/2021 PULL PICC after   Per Dr Tommy Medal.

## 2021-02-21 NOTE — Progress Notes (Signed)
Subjective:  Chief complaint pain in his right side of his chest where he had chest tube   Patient ID: Christopher Fernandez, male    DOB: 1971/04/17, 50 y.o.   MRN: [id number]  HPI  50 y.o. 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   He ultimately went to the operating room on April 22 and underwent vertebrectomy, and decompression of his epidural abscess with hardware reconstruction joint neurosurgical and cardiothoracic surgical involvement.   He then had to go back to the operating room on the 29th for redo thoracotomy due to an infected empyema.  Chest tubes were placed.   Unfortunately his MRSA isolate from the operating room was now found to be daptomycin resistant though still sensitive to vancomycin he was switched over to vancomycin along with rifampin given the hardware.   Chest tubes were removed towards the end of his hospital stay.  He now is actually complaining of some swelling and discomfort along the site where his chest tube was placed.  He has had deterioration of his third toe and going to undergo amputation with vascular surgery with Dr. Carlis Abbott on Monday.  We did obtain a repeat MRI of his pelvis due to the concerns he was still having pain in his thigh.  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  Last talked and his wife had tested positive for COVID-19 and he himself again did.  We prescribed him moluprinavir.     :   Past Medical History:   Diagnosis Date  . Alcoholism ([place])   . Anemia   . Anxiety   . Arthritis    knees, hands, lower back  . Asthma   . Blood transfusion without reported diagnosis   . COPD (chronic obstructive pulmonary disease) ([place])   . Depression   . Diabetes ([place])    type 2  . GERD (gastroesophageal reflux disease)   . History of hiatal hernia   . HTN (hypertension)   . MRSA bacteremia   . MRSA infection 01/11/2020  . Neuromuscular disorder ([facility])    tremors  . Osteomyelitis ([place])    right forefoot  . Pancreatitis   . Pneumonia   . Substance abuse ([place])   . 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 Wears glasses     Past Surgical History:  Procedure Laterality Date  . 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 AMPUTATION Right 10/18/2019   Procedure: RIGHT SECOND TOE AMPUTATION;  Surgeon: Newt Minion, MD;  Location: [place];  Service: Orthopedics;  Laterality: Right;  . AMPUTATION Right 11/08/2019   Procedure: RIGHT TRANSMETATARSAL AMPUTATION;  Surgeon: Newt Minion, MD;  Location: [place];  Service: Orthopedics;  Laterality: Right;  . AMPUTATION Left 12/02/2020   Procedure: AMPUTATION 2ND TOE;  Surgeon: Elam Dutch, MD;  Location: [place];  Service: Vascular;  Laterality: Left;  . APPLICATION OF WOUND VAC Right 01/10/2020   Procedure: APPLICATION OF WOUND VAC, right foot;  Surgeon: [name], MD;  Location: [place];  Service: Vascular;  Laterality: Right;  . BILIARY DILATION  12/06/2020   Procedure: BILIARY DILATION;  Surgeon: Rush Landmark Telford Nab., MD;  Location: [place];  Service: Gastroenterology;;  . BIOPSY  12/06/2020   Procedure: BIOPSY;  Surgeon: Irving Copas., MD;  Location: [facility] ENDOSCOPY;  Service: Gastroenterology;;  . [facility]  2006   with gallbladder and spleen  . COLONOSCOPY  8-10 years ago    in [place] exam per pt  . ENDOSCOPIC RETROGRADE CHOLANGIOPANCREATOGRAPHY (ERCP) WITH PROPOFOL N/A 12/06/2020   Procedure: ENDOSCOPIC  RETROGRADE CHOLANGIOPANCREATOGRAPHY (ERCP) WITH PROPOFOL;  Surgeon: Rush Landmark Telford Nab., MD;  Location: [place];  Service: Gastroenterology;  Laterality: N/A;  . HERNIA REPAIR  2008, 2010   hiatal hernia and 1 additional  . NASAL SEPTUM SURGERY    . PANCREATIC PSEUDOCYST DRAINAGE    . PANCREATIC STENT PLACEMENT  12/06/2020   Procedure: PANCREATIC STENT PLACEMENT;  Surgeon: Rush Landmark Telford Nab., MD;  Location: [place];  Service: Gastroenterology;;  . PLEURAL EFFUSION DRAINAGE Right 01/10/2021   Procedure: DRAINAGE OF PLEURAL EFFUSION;  Surgeon: Gaye Pollack, MD;  Location: [place];  Service: Thoracic;  Laterality: Right;  . REMOVAL OF STONES  12/06/2020   Procedure: REMOVAL OF STONES;  Surgeon: Irving Copas., MD;  Location: [place];  Service: Gastroenterology;;  . [name]  12/06/2020   Procedure: [name];  Surgeon: Irving Copas., MD;  Location: [place];  Service: Gastroenterology;;  . SPLENECTOMY  2006  . STUMP REVISION Right 11/24/2019   Procedure: REVISION RIGHT TRANSMETATARSAL AMPUTATION;  Surgeon: Newt Minion, MD;  Location[place];  Service: Orthopedics;  Laterality: Right;  . TEE WITHOUT CARDIOVERSION N/A 12/04/2020   Procedure: TRANSESOPHAGEAL ECHOCARDIOGRAM (TEE);  Surgeon: Acie Fredrickson Wonda Cheng, MD;  Location: [facility] ENDOSCOPY;  Service: Cardiovascular;  Laterality: N/A;  . THORACIC EXPOSURE Right 01/03/2021   Procedure: THORACIC EXPOSURE with removal of rib;  Surgeon: Gaye Pollack, MD;  Location: [place];  Service: Cardiothoracic;  Laterality: Right;  . THORACOTOMY Right 01/10/2021   Procedure: REDO THORACOTOMY;  Surgeon: Gaye Pollack, MD;  Location: [facility] OR;  Service: Thoracic;  Laterality: Right;  . [place] VERTEBRECTOMY Right 01/03/2021   Procedure: Decompression of  Thoracic nine and Thoracic ten via corpectomy reconstruction with titanium spacer and rib autogrtaft. Lateral plate fixation Thoracic eight - Thoracic eleven;   Surgeon: Kristeen Miss, MD;  Location: [place];  Service: Neurosurgery;  Laterality: Right;  . TOTAL KNEE ARTHROPLASTY Right 1982, 1984  . TRANSMETATARSAL AMPUTATION Left 09/06/2020   Procedure: AMPUTATION LEFT GREAT TOE;  Surgeon: Waynetta Sandy, MD;  Location: [place];  Service: Vascular;  Laterality: Left;  . WOUND DEBRIDEMENT Right 01/10/2020   Procedure: incisional DEBRIDEMENT of RIGHT TRANSMETATARSAL WOUND;  Surgeon: [name], MD;  Location[facility] OR;  Service: Vascular;  Laterality: Right;    Family History  Problem Relation Age of Onset  . Diabetes Mother   . Hypertension Mother   . Hyperlipidemia Mother   . Kidney disease Mother   . Thyroid disease Mother   . Breast cancer Mother        mets  . Lung cancer Mother   . Diabetes Father   . Alcohol abuse Sister   . Sickle cell trait Sister   . Diabetes Brother   . Asthma Brother   . Lung cancer Maternal Grandmother   . Kidney disease Maternal Grandmother   . Liver cancer Maternal Uncle        x 4-5  . Kidney disease Maternal Uncle   .  Kidney disease Paternal Uncle   . Colon cancer Neg Hx   . Colon polyps Neg Hx   . Esophageal cancer Neg Hx   . Rectal cancer Neg Hx   . Stomach cancer Neg Hx       Social History   Socioeconomic History  . Marital status: Married    Spouse name: [name]  . Number of children: 1  . Years of education: Not on file  . Highest education level: Not on file  Occupational History  . Occupation: unemployed  Tobacco Use  . Smoking status: Every Day    Packs/day: 0.50    Years: 38.00    Pack years: 19.00    Types: Cigarettes    Start date: 09/14/1982  . Smokeless tobacco: Never  . Tobacco comments:    2-3 cigarettes a day  Vaping Use  . Vaping Use: Never used  Substance and Sexual Activity  . Alcohol use: Not Currently    Comment: 2-3 per day, none since 2019  . Drug use: Not Currently  . Sexual activity: Not on file  Other Topics Concern  . Not on file  Social  History Narrative   One child, 4 step-kids   Social Determinants of Health   Financial Resource Strain: Not on file  Food Insecurity: Not on file  Transportation Needs: Not on file  Physical Activity: Not on file  Stress: Not on file  Social Connections: Not on file    Allergies  Allergen Reactions  . Eggs Or Egg-Derived Products Rash  . Morphine And Related Other (See Comments)    Cant take because of pancreatitis  . Cocoa Rash     Current Outpatient Medications:  .  acetaminophen (TYLENOL) 500 MG tablet, Take 2 tablets (1,000 mg total) by mouth every 6 (six) hours. (Patient taking differently: Take 1,000 mg by mouth every 6 (six) hours as needed for moderate pain.), Disp: 30 tablet, Rfl: 0 .  albuterol (VENTOLIN HFA) 108 (90 Base) MCG/ACT inhaler, INHALE 2 TO 4 PUFFS INTO THE LUNGS EVERY 4 HOURS AS NEEDED FOR WHEEZING OR COUGH. (Patient taking differently: Inhale 2-4 puffs into the lungs every 4 (four) hours as needed for wheezing or shortness of breath.), Disp: 18 g, Rfl: 11 .  ARIPiprazole (ABILIFY) 5 MG tablet, TAKE 1 TABLET (5 MG TOTAL) BY MOUTH DAILY. (Patient taking differently: Take 5 mg by mouth every evening.), Disp: 90 tablet, Rfl: 2 .  atorvastatin (LIPITOR) 40 MG tablet, Take 1 tablet (40 mg total) by mouth daily., Disp: 90 tablet, Rfl: 3 .  baclofen (LIORESAL) 10 MG tablet, Take 1 tablet (10 mg total) by mouth 3 (three) times daily as needed for muscle spasms., Disp: 60 each, Rfl: 0 .  bismuth subsalicylate (PEPTO BISMOL) 262 MG/15ML suspension, Take 30 mLs by mouth every 6 (six) hours as needed for indigestion or diarrhea or loose stools., Disp: , Rfl:  .  cetirizine (ZYRTEC) 10 MG tablet, Take 1 tablet (10 mg total) by mouth daily. (Patient taking differently: Take 10 mg by mouth daily as needed for allergies.), Disp: 30 tablet, Rfl: 11 .  Continuous Blood Gluc Receiver (DEXCOM G6 RECEIVER) DEVI, 1 Device by Does not apply route 4 (four) times daily., Disp: 1 each, Rfl:  11 .  Continuous Blood Gluc Transmit (DEXCOM G6 TRANSMITTER) MISC, 4 (four) times daily. as directed, Disp: , Rfl:  .  Continuous Blood Gluc Transmit (DEXCOM G6 TRANSMITTER) MISC, USE AS DIRECTED FOUR TIMES DAILY, Disp: 1 each, Rfl: 11 .  cyclobenzaprine (FLEXERIL) 10 MG tablet, Take 1 tablet (10 mg total) by mouth 3 (three) times daily., Disp: 60 tablet, Rfl: 0 .  DULoxetine (CYMBALTA) 60 MG capsule, TAKE 1 CAPSULE (60 MG TOTAL) BY MOUTH DAILY. (Patient taking differently: Take 60 mg by mouth daily.), Disp: 90 capsule, Rfl: 2 .  EASY TOUCH PEN NEEDLES 31G X 5 MM MISC, Inject 1 application as directed 3 (three) times daily., Disp: 100 each, Rfl: 11 .  empagliflozin (JARDIANCE) 25 MG TABS tablet, TAKE 1 TABLET BY MOUTH DAILY. (Patient taking differently: Take 25 mg by mouth daily.), Disp: 90 tablet, Rfl: 3 .  Fluticasone-Salmeterol (ADVAIR) 250-50 MCG/DOSE AEPB, INHALE 1 PUFF INTO THE LUNGS 2 TIMES DAILY. (Patient taking differently: Inhale 1 puff into the lungs in the morning and at bedtime.), Disp: 180 each, Rfl: 3 .  gabapentin (NEURONTIN) 300 MG capsule, TAKE 2 CAPSULES (600 MG TOTAL) BY MOUTH 3 (THREE) TIMES DAILY. (Patient taking differently: Take 600 mg by mouth 3 (three) times daily.), Disp: 180 capsule, Rfl: 2 .  glucose blood test strip, USE AS INSTRUCTED, Disp: 100 strip, Rfl: 12 .  Heparin Lock Flush (HEPARIN FLUSH) 10 UNIT/ML SOLN injection, Inject 100 Units into the vein 2 (two) times daily., Disp: , Rfl:  .  ibuprofen (ADVIL) 400 MG tablet, Take 1 tablet (400 mg total) by mouth every 8 (eight) hours as needed for mild pain., Disp: 60 tablet, Rfl: 0 .  insulin glargine (LANTUS) 100 UNIT/ML injection, Inject 10 Units into the skin at bedtime., Disp: , Rfl:  .  insulin lispro (HUMALOG) 100 UNIT/ML KwikPen, INJECT 6-10 UNITS INTO THE SKIN 3 (THREE) TIMES DAILY. (Patient taking differently: Inject 2-6 Units into the skin 3 (three) times daily. Sliding scale not provided), Disp: 15 mL, Rfl:  11 .  Lancets (FREESTYLE) lancets, Use as instructed, Disp: 100 each, Rfl: 12 .  lidocaine (LIDODERM) 5 %, PLACE 1 PATCH ONTO THE SKIN DAILY. REMOVE & DISCARD PATCH WITHIN 12 HOURS OR AS DIRECTED BY MD (Patient taking differently: 1 patch daily. Remove & discard patch within 12 hours or as directed by MD), Disp: 30 patch, Rfl: 0 .  lipase/protease/amylase (CREON) 36000 UNITS CPEP capsule, Take 36,000 Units by mouth with breakfast, with lunch, and with evening meal., Disp: , Rfl:  .  metFORMIN (GLUCOPHAGE) 1000 MG tablet, TAKE 1 TABLET (1,000 MG TOTAL) BY MOUTH 2 (TWO) TIMES DAILY WITH A MEAL. (Patient taking differently: Take 1,000 mg by mouth 2 (two) times daily with a meal.), Disp: 180 tablet, Rfl: 3 .  metoprolol succinate (TOPROL-XL) 100 MG 24 hr tablet, Take 1 tablet (100 mg total) by mouth at bedtime. Take with or immediately following a meal., Disp: 90 tablet, Rfl: 3 .  mirtazapine (REMERON) 30 MG tablet, TAKE 1 TABLET (30 MG TOTAL) BY MOUTH AT BEDTIME. (Patient taking differently: Take 30 mg by mouth at bedtime.), Disp: 90 tablet, Rfl: 3 .  montelukast (SINGULAIR) 10 MG tablet, TAKE 1 TABLET (10 MG TOTAL) BY MOUTH AT BEDTIME. (Patient taking differently: Take 10 mg by mouth in the morning.), Disp: 90 tablet, Rfl: 11 .  omeprazole (PRILOSEC) 40 MG capsule, Take 1 capsule (40 mg total) by mouth daily., Disp: 30 capsule, Rfl: 3 .  ondansetron (ZOFRAN ODT) 4 MG disintegrating tablet, Take 1 tablet (4 mg total) by mouth every 8 (eight) hours as needed for nausea or vomiting., Disp: 20 tablet, Rfl: 0 .  oxyCODONE-acetaminophen (PERCOCET) 10-325 MG tablet, Take 1 tablet by mouth every 6 (six) hours  as needed. (Patient taking differently: Take 1 tablet by mouth every 6 (six) hours as needed for pain.), Disp: 60 tablet, Rfl: 0 .  rifampin (RIFADIN) 300 MG capsule, Take 1 capsule (300 mg total) by mouth 2 (two) times daily., Disp: 100 capsule, Rfl: 0 .  senna-docusate (SENOKOT-S) 8.6-50 MG tablet, Take 1  tablet by mouth at bedtime., Disp: 30 tablet, Rfl: 0 .  sitaGLIPtin (JANUVIA) 100 MG tablet, TAKE 1 TABLET (100 MG TOTAL) BY MOUTH DAILY. (Patient taking differently: Take 100 mg by mouth daily.), Disp: 90 tablet, Rfl: 10 .  vancomycin HCl (VANCOREADY) 1500 MG/300ML SOLN, Inject 300 mLs (1,500 mg total) into the vein every 12 (twelve) hours., Disp: 30000 mL, Rfl: 0 .  vancomycin IVPB, Inject 1,500 mg into the vein every 12 (twelve) hours. Indication:  Disseminated MRSA infection  First Dose: No Last Day of Therapy:  02/25/2021 Labs - Sunday/Monday:  CBC/D, BMP, and vancomycin trough. Labs - Thursday:  BMP and vancomycin trough Labs - Every other week:  ESR and CRP Method of administration:Elastomeric Method of administration may be changed at the discretion of the patient and/or caregiver's ability to self-administer the medication ordered., Disp: 86 Units, Rfl: 0   Review of Systems  Constitutional:  Negative for chills and fever.  HENT:  Negative for congestion and sore throat.   Eyes:  Negative for photophobia.  Respiratory:  Negative for cough, shortness of breath and wheezing.   Cardiovascular:  Negative for chest pain, palpitations and leg swelling.  Gastrointestinal:  Negative for abdominal pain, blood in stool, constipation, diarrhea, nausea and vomiting.  Genitourinary:  Negative for dysuria, flank pain and hematuria.  Musculoskeletal:  Negative for back pain and myalgias.  Skin:  Negative for rash.  Neurological:  Negative for dizziness, weakness and headaches.  Hematological:  Does not bruise/bleed easily.  Psychiatric/Behavioral:  Negative for behavioral problems, confusion, self-injury, sleep disturbance and suicidal ideas. The patient is not nervous/anxious and is not hyperactive.       Objective:   Physical Exam Constitutional:      General: He is not in acute distress.    Appearance: Normal appearance. He is well-developed. He is not ill-appearing or diaphoretic.  HENT:      Head: Normocephalic and atraumatic.     Right Ear: Hearing and external ear normal.     Left Ear: Hearing and external ear normal.     Nose: No nasal deformity or rhinorrhea.  Eyes:     General: No scleral icterus.    Conjunctiva/sclera: Conjunctivae normal.     Right eye: Right conjunctiva is not injected.     Left eye: Left conjunctiva is not injected.     Pupils: Pupils are equal, round, and reactive to light.  Neck:     Vascular: No JVD.  Cardiovascular:     Rate and Rhythm: Normal rate and regular rhythm.     Heart sounds: Normal heart sounds, S1 normal and S2 normal. No murmur heard.   No friction rub. No gallop.  Pulmonary:     Effort: No respiratory distress.     Breath sounds: No stridor. No wheezing, rhonchi or rales.  Chest:     Chest wall: No tenderness.  Abdominal:     General: Bowel sounds are normal. There is no distension.     Palpations: Abdomen is soft.     Tenderness: There is no abdominal tenderness.  Musculoskeletal:        General: Normal range of motion.  Right shoulder: Normal.     Left shoulder: Normal.     Cervical back: Normal range of motion and neck supple.     Right hip: Normal.     Left hip: Normal.     Right knee: Normal.     Left knee: Normal.  Lymphadenopathy:     Head:     Right side of head: No submandibular, preauricular or posterior auricular adenopathy.     Left side of head: No submandibular, preauricular or posterior auricular adenopathy.     Cervical: No cervical adenopathy.     Right cervical: No superficial or deep cervical adenopathy.    Left cervical: No superficial or deep cervical adenopathy.  Skin:    General: Skin is warm and dry.     Coloration: Skin is not pale.     Findings: No abrasion, bruising, ecchymosis, erythema, lesion or rash.     Nails: There is no clubbing.  Neurological:     General: No focal deficit present.     Mental Status: He is alert and oriented to person, place, and time.     Sensory: No  sensory deficit.     Coordination: Coordination normal.     Gait: Gait normal.  Psychiatric:        Attention and Perception: He is attentive.        Mood and Affect: Mood normal.        Speech: Speech normal.        Behavior: Behavior normal. Behavior is cooperative.        Thought Content: Thought content normal.        Judgment: Judgment normal.     Left foot today 02/21/2021:       PICC line 02/21/2021:     Site of chest tubes where he is experiencing swelling 02/21/2021:       Assessment & Plan:   50 y.o. 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   He ultimately went to the operating room on April 22 and underwent vertebrectomy, and decompression of his epidural abscess with hardware reconstruction joint neurosurgical and cardiothoracic surgical involvement.   He then had to go back to the operating room on the 29th for redo thoracotomy due to an infected empyema.  Chest tubes were placed.   Unfortunately his MRSA isolate from the operating room was now found to be daptomycin resistant though still sensitive to vancomycin he was switched over to vancomycin along with rifampin given the hardware.   Chest tubes were removed towards the end of his hospital stay.  He also previously been found to have osteomyelitis of the pubic symphysis.  I repeated an MRI of this area because he had some discomfort in his leg and sensation in the muscle.  MRI showed improvement of the myositis as well as the osteomyelitis at the site.  He is now experiencing some  swelling and discomfort where his chest tube was placed previously.  --- He is to complete his IV vancomycin on June 14 or 15 if he skips that 1 day when is going to have surgery removal of PICC and range over to indefinite doxycycline  Continue rifampin   Chest wall tenderness: Ordering ultrasound the chest and having a monitor the site may need to get a CT with contrast of the chest if this persists and make sure he sees Dr. Alycia Rossetti again.   Osteomyelitis of third toe: Going for amputation with vascular surgery on Monday.  COVID-19: He is recovered from his most recent infection with this he does need a booster vaccine which were giving him today in clinic.  I spent more than 40 minutes with the patient including greater than 50% of time in face to face counseling of the patient personally reviewing radiographs, along with pertinent laboratory microbiological data review of medical records and in coordination of his care.

## 2021-02-21 NOTE — Progress Notes (Signed)
DUE TO COVID-19 ONLY ONE VISITOR IS ALLOWED TO COME WITH YOU AND STAY IN THE WAITING ROOM ONLY DURING PRE OP AND PROCEDURE DAY OF SURGERY.   PCP - Dr Mina Marble Cardiologist - n/a  Chest x-ray - DOS 02/24/21 EKG - 01/10/21 Stress Test - n/a ECHO TEE - 12/04/20 Cardiac Cath - n/a  Fasting Blood Sugar - 80s-110s Checks Blood Sugar 4 times a day Dexcom System is not working.  Patient has a PICC.   Do not take jardiance, januvia or metformin on the morning of surgery.  THE NIGHT BEFORE SURGERY, take 5 units of Lantus Insulin.  Humalog Insulin take 1-4 units at dinner dose (70%)      If your blood sugar is less than 70 mg/dL, you will need to treat for low blood sugar: Treat a low blood sugar (less than 70 mg/dL) with  cup of clear juice (cranberry or apple), 4 glucose tablets, OR glucose gel. Recheck blood sugar in 15 minutes after treatment (to make sure it is greater than 70 mg/dL). If your blood sugar is not greater than 70 mg/dL on recheck, call [phone] for further instructions.  Anesthesia review: Yes  STOP now taking any Aspirin (unless otherwise instructed by your surgeon), Aleve, Naproxen, Ibuprofen, Motrin, Advil, Goody's, BC's, all herbal medications, fish oil, and all vitamins.   Coronavirus Screening Covid test N/A - Ambulatory Surgery  Do you have any of the following symptoms:  Cough yes/no: No Fever (>100.84F)  yes/no: No Runny nose yes/no: No Sore throat yes/no: No Difficulty breathing/shortness of breath  yes/no: No  Have you traveled in the last 14 days and where? yes/no: No  Patient verbalized understanding of instructions that were given via phone.

## 2021-02-22 DIAGNOSIS — A4902 Methicillin resistant Staphylococcus aureus infection, unspecified site: Secondary | ICD-10-CM | POA: Diagnosis not present

## 2021-02-22 DIAGNOSIS — L02611 Cutaneous abscess of right foot: Secondary | ICD-10-CM | POA: Diagnosis not present

## 2021-02-22 DIAGNOSIS — A327 Listerial sepsis: Secondary | ICD-10-CM | POA: Diagnosis not present

## 2021-02-22 DIAGNOSIS — S98011A Complete traumatic amputation of right foot at ankle level, initial encounter: Secondary | ICD-10-CM | POA: Diagnosis not present

## 2021-02-24 ENCOUNTER — Ambulatory Visit (HOSPITAL_COMMUNITY): Payer: 59 | Admitting: Physician Assistant

## 2021-02-24 ENCOUNTER — Encounter (HOSPITAL_COMMUNITY)
Admission: RE | Disposition: A | Discharge status: Home / Self Care | Payer: Self-pay | Source: Home / Self Care | Attending: Vascular Surgery

## 2021-02-24 ENCOUNTER — Ambulatory Visit (HOSPITAL_COMMUNITY)
Admission: RE | Admit: 2021-02-24 | Discharge: 2021-02-24 | Disposition: A | Discharge status: Home / Self Care | Payer: 59 | Attending: Vascular Surgery | Admitting: Vascular Surgery

## 2021-02-24 ENCOUNTER — Other Ambulatory Visit: Payer: Self-pay

## 2021-02-24 ENCOUNTER — Other Ambulatory Visit (HOSPITAL_COMMUNITY): Payer: Self-pay

## 2021-02-24 ENCOUNTER — Encounter (HOSPITAL_COMMUNITY): Payer: Self-pay | Admitting: Vascular Surgery

## 2021-02-24 DIAGNOSIS — Z833 Family history of diabetes mellitus: Secondary | ICD-10-CM | POA: Diagnosis not present

## 2021-02-24 DIAGNOSIS — Z832 Family history of diseases of the blood and blood-forming organs and certain disorders involving the immune mechanism: Secondary | ICD-10-CM | POA: Diagnosis not present

## 2021-02-24 DIAGNOSIS — Z885 Allergy status to narcotic agent status: Secondary | ICD-10-CM | POA: Insufficient documentation

## 2021-02-24 DIAGNOSIS — Z825 Family history of asthma and other chronic lower respiratory diseases: Secondary | ICD-10-CM | POA: Diagnosis not present

## 2021-02-24 DIAGNOSIS — I10 Essential (primary) hypertension: Secondary | ICD-10-CM | POA: Insufficient documentation

## 2021-02-24 DIAGNOSIS — Z801 Family history of malignant neoplasm of trachea, bronchus and lung: Secondary | ICD-10-CM | POA: Insufficient documentation

## 2021-02-24 DIAGNOSIS — Z89422 Acquired absence of other left toe(s): Secondary | ICD-10-CM | POA: Insufficient documentation

## 2021-02-24 DIAGNOSIS — Z841 Family history of disorders of kidney and ureter: Secondary | ICD-10-CM | POA: Insufficient documentation

## 2021-02-24 DIAGNOSIS — K219 Gastro-esophageal reflux disease without esophagitis: Secondary | ICD-10-CM | POA: Insufficient documentation

## 2021-02-24 DIAGNOSIS — Z803 Family history of malignant neoplasm of breast: Secondary | ICD-10-CM | POA: Insufficient documentation

## 2021-02-24 DIAGNOSIS — Z794 Long term (current) use of insulin: Secondary | ICD-10-CM | POA: Insufficient documentation

## 2021-02-24 DIAGNOSIS — E1169 Type 2 diabetes mellitus with other specified complication: Secondary | ICD-10-CM | POA: Insufficient documentation

## 2021-02-24 DIAGNOSIS — Z800 Family history of malignant neoplasm of digestive organs: Secondary | ICD-10-CM | POA: Insufficient documentation

## 2021-02-24 DIAGNOSIS — Z7951 Long term (current) use of inhaled steroids: Secondary | ICD-10-CM | POA: Insufficient documentation

## 2021-02-24 DIAGNOSIS — J449 Chronic obstructive pulmonary disease, unspecified: Secondary | ICD-10-CM | POA: Insufficient documentation

## 2021-02-24 DIAGNOSIS — Z91012 Allergy to eggs: Secondary | ICD-10-CM | POA: Diagnosis not present

## 2021-02-24 DIAGNOSIS — Z8349 Family history of other endocrine, nutritional and metabolic diseases: Secondary | ICD-10-CM | POA: Diagnosis not present

## 2021-02-24 DIAGNOSIS — Z7984 Long term (current) use of oral hypoglycemic drugs: Secondary | ICD-10-CM | POA: Diagnosis not present

## 2021-02-24 DIAGNOSIS — Z79899 Other long term (current) drug therapy: Secondary | ICD-10-CM | POA: Insufficient documentation

## 2021-02-24 DIAGNOSIS — F17210 Nicotine dependence, cigarettes, uncomplicated: Secondary | ICD-10-CM | POA: Diagnosis not present

## 2021-02-24 DIAGNOSIS — M869 Osteomyelitis, unspecified: Secondary | ICD-10-CM | POA: Diagnosis not present

## 2021-02-24 DIAGNOSIS — Z91018 Allergy to other foods: Secondary | ICD-10-CM | POA: Diagnosis not present

## 2021-02-24 DIAGNOSIS — M868X7 Other osteomyelitis, ankle and foot: Secondary | ICD-10-CM | POA: Diagnosis not present

## 2021-02-24 HISTORY — PX: AMPUTATION: SHX166

## 2021-02-24 LAB — BASIC METABOLIC PANEL
Anion gap: 7 (ref 5–15)
BUN: 7 mg/dL (ref 6–20)
CO2: 26 mmol/L (ref 22–32)
Calcium: 8.5 mg/dL — ABNORMAL LOW (ref 8.9–10.3)
Chloride: 101 mmol/L (ref 98–111)
Creatinine, Ser: 0.66 mg/dL (ref 0.61–1.24)
GFR, Estimated: >60 mL/min (ref >60)
Glucose, Bld: 207 mg/dL — ABNORMAL HIGH (ref 70–99)
Potassium: 4 mmol/L (ref 3.5–5.1)
Sodium: 134 mmol/L — ABNORMAL LOW (ref 135–145)

## 2021-02-24 LAB — GLUCOSE, CAPILLARY
Glucose-Capillary: 232 mg/dL — ABNORMAL HIGH (ref 70–99)
Glucose-Capillary: 180 mg/dL — ABNORMAL HIGH (ref 70–99)
Glucose-Capillary: 182 mg/dL — ABNORMAL HIGH (ref 70–99)
Glucose-Capillary: 174 mg/dL — ABNORMAL HIGH (ref 70–99)

## 2021-02-24 LAB — SURGICAL PCR SCREEN
MRSA, PCR: NEGATIVE
Staphylococcus aureus: NEGATIVE

## 2021-02-24 SURGERY — AMPUTATION DIGIT
Anesthesia: Regional | Laterality: Left

## 2021-02-24 MED ORDER — FENTANYL CITRATE (PF) 100 MCG/2ML IJ SOLN
25.0000 ug | INTRAMUSCULAR | Status: DC | PRN
  Administered 2021-02-24: 25 ug via INTRAVENOUS

## 2021-02-24 MED ORDER — METOPROLOL SUCCINATE ER 25 MG PO TB24
ORAL_TABLET | ORAL | Status: AC
  Administered 2021-02-24: 100 mg via ORAL
  Filled 2021-02-24: qty 4

## 2021-02-24 MED ORDER — ONDANSETRON HCL 4 MG/2ML IJ SOLN
INTRAMUSCULAR | Status: AC
  Filled 2021-02-24: qty 2

## 2021-02-24 MED ORDER — FENTANYL CITRATE (PF) 100 MCG/2ML IJ SOLN
INTRAMUSCULAR | Status: AC
  Administered 2021-02-24: 50 ug via INTRAVENOUS
  Filled 2021-02-24: qty 2

## 2021-02-24 MED ORDER — PHENYLEPHRINE 40 MCG/ML (10ML) SYRINGE FOR IV PUSH (FOR BLOOD PRESSURE SUPPORT)
PREFILLED_SYRINGE | INTRAVENOUS | Status: AC
  Filled 2021-02-24: qty 10

## 2021-02-24 MED ORDER — PROPOFOL 10 MG/ML IV BOLUS
INTRAVENOUS | Status: AC
  Filled 2021-02-24: qty 20

## 2021-02-24 MED ORDER — CHLORHEXIDINE GLUCONATE 4 % EX LIQD: 60.0000 mL | Freq: Once | CUTANEOUS | Status: DC

## 2021-02-24 MED ORDER — PROMETHAZINE HCL 25 MG/ML IJ SOLN: 6.2500 mg | INTRAMUSCULAR | Status: DC | PRN

## 2021-02-24 MED ORDER — 0.9 % SODIUM CHLORIDE (POUR BTL) OPTIME
TOPICAL | Status: DC | PRN
  Administered 2021-02-24: 1000 mL

## 2021-02-24 MED ORDER — CEFAZOLIN SODIUM-DEXTROSE 2-4 GM/100ML-% IV SOLN
2.0000 g | INTRAVENOUS | Status: DC
  Filled 2021-02-24: qty 100

## 2021-02-24 MED ORDER — FENTANYL CITRATE (PF) 100 MCG/2ML IJ SOLN
INTRAMUSCULAR | Status: AC
  Filled 2021-02-24: qty 2

## 2021-02-24 MED ORDER — MIDAZOLAM HCL 2 MG/2ML IJ SOLN
INTRAMUSCULAR | Status: DC | PRN
  Administered 2021-02-24 (×2): 1 mg via INTRAVENOUS

## 2021-02-24 MED ORDER — CLONIDINE HCL (ANALGESIA) 100 MCG/ML EP SOLN
EPIDURAL | Status: DC | PRN
  Administered 2021-02-24: 100 ug

## 2021-02-24 MED ORDER — BUPIVACAINE-EPINEPHRINE (PF) 0.5% -1:200000 IJ SOLN
INTRAMUSCULAR | Status: DC | PRN
  Administered 2021-02-24: 30 mL via PERINEURAL

## 2021-02-24 MED ORDER — EPHEDRINE 5 MG/ML INJ
INTRAVENOUS | Status: AC
  Filled 2021-02-24: qty 10

## 2021-02-24 MED ORDER — MEPERIDINE HCL 25 MG/ML IJ SOLN: 6.2500 mg | INTRAMUSCULAR | Status: DC | PRN

## 2021-02-24 MED ORDER — MIDAZOLAM HCL 2 MG/2ML IJ SOLN
1.0000 mg | Freq: Once | INTRAMUSCULAR | Status: AC
  Filled 2021-02-24: qty 1

## 2021-02-24 MED ORDER — MIDAZOLAM HCL 2 MG/2ML IJ SOLN
INTRAMUSCULAR | Status: AC
  Administered 2021-02-24: 1 mg via INTRAVENOUS
  Filled 2021-02-24: qty 2

## 2021-02-24 MED ORDER — OXYCODONE-ACETAMINOPHEN 10-325 MG PO TABS
1.0000 | ORAL_TABLET | Freq: Four times a day (QID) | ORAL | 0 refills | Status: DC | PRN
  Filled 2021-02-24: qty 20, 5d supply, fill #0

## 2021-02-24 MED ORDER — LIDOCAINE HCL (PF) 2 % IJ SOLN
INTRAMUSCULAR | Status: AC
  Filled 2021-02-24: qty 5

## 2021-02-24 MED ORDER — DEXAMETHASONE SODIUM PHOSPHATE 4 MG/ML IJ SOLN
INTRAMUSCULAR | Status: DC | PRN
  Administered 2021-02-24: 5 mg via INTRAVENOUS

## 2021-02-24 MED ORDER — DEXAMETHASONE SODIUM PHOSPHATE 10 MG/ML IJ SOLN
INTRAMUSCULAR | Status: AC
  Filled 2021-02-24: qty 1

## 2021-02-24 MED ORDER — PROPOFOL 500 MG/50ML IV EMUL
INTRAVENOUS | Status: DC | PRN
  Administered 2021-02-24: 100 ug/kg/min via INTRAVENOUS

## 2021-02-24 MED ORDER — FENTANYL CITRATE (PF) 250 MCG/5ML IJ SOLN
INTRAMUSCULAR | Status: AC
  Filled 2021-02-24: qty 5

## 2021-02-24 MED ORDER — FENTANYL CITRATE (PF) 250 MCG/5ML IJ SOLN
INTRAMUSCULAR | Status: DC | PRN
  Administered 2021-02-24 (×2): 25 ug via INTRAVENOUS

## 2021-02-24 MED ORDER — CHLORHEXIDINE GLUCONATE 0.12 % MT SOLN
OROMUCOSAL | Status: AC
  Administered 2021-02-24: 15 mL
  Filled 2021-02-24: qty 15

## 2021-02-24 MED ORDER — METOPROLOL SUCCINATE ER 100 MG PO TB24
100.0000 mg | ORAL_TABLET | Freq: Once | ORAL | Status: AC
  Filled 2021-02-24: qty 1

## 2021-02-24 MED ORDER — ONDANSETRON HCL 4 MG/2ML IJ SOLN
INTRAMUSCULAR | Status: DC | PRN
  Administered 2021-02-24: 4 mg via INTRAVENOUS

## 2021-02-24 MED ORDER — LACTATED RINGERS IV SOLN: INTRAVENOUS | Status: DC

## 2021-02-24 MED ORDER — MIDAZOLAM HCL 2 MG/2ML IJ SOLN
INTRAMUSCULAR | Status: AC
  Filled 2021-02-24: qty 2

## 2021-02-24 MED ORDER — VANCOMYCIN HCL 1500 MG/300ML IV SOLN
1500.0000 mg | Freq: Once | INTRAVENOUS | Status: AC
  Administered 2021-02-24: 1500 mg via INTRAVENOUS
  Filled 2021-02-24: qty 300

## 2021-02-24 MED ORDER — SODIUM CHLORIDE 0.9 % IV SOLN: INTRAVENOUS | Status: DC

## 2021-02-24 MED ORDER — LACTATED RINGERS IV SOLN: INTRAVENOUS | Status: DC | PRN

## 2021-02-24 MED ORDER — FENTANYL CITRATE (PF) 100 MCG/2ML IJ SOLN
50.0000 ug | Freq: Once | INTRAMUSCULAR | Status: AC
Start: 2021-02-24 — End: 2021-02-24
  Filled 2021-02-24: qty 1

## 2021-02-24 MED FILL — Continuous Glucose System Transmitter: 90 days supply | Qty: 1 | Fill #0 | Status: AC

## 2021-02-24 SURGICAL SUPPLY — 28 items
BNDG ELASTIC 4X5.8 VLCR STR LF (GAUZE/BANDAGES/DRESSINGS) ×2 IMPLANT
BNDG GAUZE ELAST 4 BULKY (GAUZE/BANDAGES/DRESSINGS) ×2 IMPLANT
CANISTER SUCT 3000ML PPV (MISCELLANEOUS) ×2 IMPLANT
COVER SURGICAL LIGHT HANDLE (MISCELLANEOUS) ×2 IMPLANT
COVER WAND RF STERILE (DRAPES) ×2 IMPLANT
DRAPE EXTREMITY T 121X128X90 (DISPOSABLE) ×2 IMPLANT
DRAPE HALF SHEET 40X57 (DRAPES) ×2 IMPLANT
DRSG EMULSION OIL 3X3 NADH (GAUZE/BANDAGES/DRESSINGS) ×2 IMPLANT
ELECT REM PT RETURN 9FT ADLT (ELECTROSURGICAL) ×2
ELECTRODE REM PT RTRN 9FT ADLT (ELECTROSURGICAL) ×1 IMPLANT
GAUZE SPONGE 4X4 12PLY STRL (GAUZE/BANDAGES/DRESSINGS) ×2 IMPLANT
GLOVE SRG 8 PF TXTR STRL LF DI (GLOVE) ×1 IMPLANT
GLOVE SURG UNDER POLY LF SZ8 (GLOVE) ×1
GOWN STRL REUS W/ TWL LRG LVL3 (GOWN DISPOSABLE) ×2 IMPLANT
GOWN STRL REUS W/ TWL XL LVL3 (GOWN DISPOSABLE) ×2 IMPLANT
GOWN STRL REUS W/TWL LRG LVL3 (GOWN DISPOSABLE) ×2
GOWN STRL REUS W/TWL XL LVL3 (GOWN DISPOSABLE) ×2
KIT BASIN OR (CUSTOM PROCEDURE TRAY) ×2 IMPLANT
KIT TURNOVER KIT B (KITS) ×2 IMPLANT
NS IRRIG 1000ML POUR BTL (IV SOLUTION) ×2 IMPLANT
PACK GENERAL/GYN (CUSTOM PROCEDURE TRAY) ×2 IMPLANT
PAD ARMBOARD 7.5X6 YLW CONV (MISCELLANEOUS) ×4 IMPLANT
SUT ETHILON 3 0 PS 1 (SUTURE) ×2 IMPLANT
SUT VIC AB 3-0 SH 27 (SUTURE)
SUT VIC AB 3-0 SH 27X BRD (SUTURE) IMPLANT
TOWEL GREEN STERILE (TOWEL DISPOSABLE) ×4 IMPLANT
UNDERPAD 30X36 HEAVY ABSORB (UNDERPADS AND DIAPERS) ×2 IMPLANT
WATER STERILE IRR 1000ML POUR (IV SOLUTION) ×2 IMPLANT

## 2021-02-24 NOTE — Progress Notes (Signed)
Paged Dr. Carlis Abbott to inquire about Vanc.  Awaiting return call.

## 2021-02-24 NOTE — Op Note (Signed)
Date: February 24, 2021  Preoperative diagnosis: Osteomyelitis of left third toe  Postoperative diagnosis: Same  Procedure: Partial left third toe amputation  Surgeon: Dr. [name], MD  Assistant: OR staff  Indications: Patient is a 50 year old male well-known to the vascular surgery service who has previously undergone a right TMA as well as left first and second toe amputations.  He recently presented to clinic with evidence of new osteomyelitis in the left third toe.  He presents today for toe amputation after risk benefits discussed.  Findings: [place] incision was made at the base of the left third toe and a partial toe amputation was performed.  The tissue appeared healthy and this was primarily closed with 3-0 nylon's.  Anesthesia: Regional block with MAC  Details: Patient was taken to the operating room after informed consent was obtained.  Placed on operative table in supine position.  The left foot was then prepped and draped in usual sterile fashion.  He did get vancomycin and is on chronic antibiotics through infectious disease.  After timeout was performed, I marked out a fishmouth incision at the base of the left third toe.  Incision was made with 15 blade scalpel and dissection was carried down with 15 blade.  Ultimately I transected the bone with a bone cutter at the base of the wound.  I then trimmed back the proximal phalanges where the bone felt to be very healthy.  There was no significant tension and I did not have to go all the way back to the metatarsal head.  The wound was then reapproximated with 3-0 silk sutures after it was copiously irrigated.  Dry sterile dressings were applied.  He remained stable.  Complication: None  Condition: Stable  [name], MD [facility] Office: [place]

## 2021-02-24 NOTE — Progress Notes (Signed)
Left message for Dr. Carlis Abbott regarding Vancomycin.  Patient on Vanc 1500mg  q12 hours.  Last had yesterday 6/12 at 1930.  Inquired if he would like to order Vanc.

## 2021-02-24 NOTE — H&P (Signed)
History and Physical Interval Note:  02/24/2021 9:30 AM  [name]  has presented today for surgery, with the diagnosis of Osteomyeletis.  The various methods of treatment have been discussed with the patient and family. After consideration of risks, benefits and other options for treatment, the patient has consented to  Procedure(s): LEFT THIRD TOE AMPUTATION (Left) as a surgical intervention.  The patient's history has been reviewed, patient examined, no change in status, stable for surgery.  I have reviewed the patient's chart and labs.  Questions were answered to the patient's satisfaction.    Left 3rd toe amputation  [name]  Patient name: Christopher Fernandez          MRN: [id number]        DOB: Oct 17, 1970            Sex: male   REASON FOR VISIT: Evaluate draining wound from left 3rd toe   HPI: Christopher Fernandez is a 50 y.o. male with history of diabetes and HTN that presents for evaluation of new draining wound of the left third toe.  States this has been ongoing for about 3 weeks.  States he noticed pus draining out of the toe.  He has a fairly complex history and initially had a right second toe amp converted to a right TMA by orthopedic surgery and then was sent to see me for a second opinion and I debrided his right TMA in 2021 and placed him on IV antibiotics and this eventually healed.  He then underwent a left great toe amp with Dr. Donzetta Matters in December 2021 and left second toe amp by Dr. Oneida Alar in March 2022.   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  He later had to have redo thoracotomy due to empyema.  He is on IV antibiotics and is followed by ID for MRSA bacterermia and he is now on vancomycin and rifampin.          Past Medical History:  Diagnosis Date   Alcoholism ([place])     Anemia     Anxiety     Arthritis     Asthma     Blood transfusion without reported diagnosis     COPD  (chronic obstructive pulmonary disease) ([place])     Depression     Diabetes ([place])      type 2   GERD (gastroesophageal reflux disease)     History of hiatal hernia     HTN (hypertension)     MRSA bacteremia     MRSA infection 01/11/2020   Neuromuscular disorder ([place])      tremors   Osteomyelitis ([facility])      right forefoot   Pancreatitis     Pneumonia     Substance abuse ([place])     Tuberculosis      treated for exposure   Wears glasses             Past Surgical History:  Procedure Laterality Date   Amputation Right      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   AMPUTATION Right 10/18/2019    Procedure: RIGHT SECOND TOE AMPUTATION;  Surgeon: Newt Minion, MD;  Location: [place];  Service: Orthopedics;  Laterality: Right;   AMPUTATION Right 11/08/2019    Procedure: RIGHT TRANSMETATARSAL AMPUTATION;  Surgeon: Newt Minion, MD;  Location: [place];  Service: Orthopedics;  Laterality: Right;   AMPUTATION Left 12/02/2020  Procedure: AMPUTATION 2ND TOE;  Surgeon: Elam Dutch, MD;  Location: [place];  Service: Vascular;  Laterality: Left;   APPLICATION OF WOUND VAC Right 01/10/2020    Procedure: APPLICATION OF WOUND VAC, right foot;  Surgeon: [name], MD;  Location: [place];  Service: Vascular;  Laterality: Right;   BILIARY DILATION   12/06/2020    Procedure: BILIARY DILATION;  Surgeon: Irving Copas., MD;  Location: [place];  Service: Gastroenterology;;   BIOPSY   12/06/2020    Procedure: BIOPSY;  Surgeon: Irving Copas., MD;  Location: [place];  Service: Gastroenterology;;   CHOLECYSTECTOMY   2006    with gallbladder and spleen   COLONOSCOPY   8-10 years ago    in [place] exam per pt   ENDOSCOPIC RETROGRADE CHOLANGIOPANCREATOGRAPHY (ERCP) WITH PROPOFOL N/A 12/06/2020    Procedure: ENDOSCOPIC RETROGRADE CHOLANGIOPANCREATOGRAPHY (ERCP) WITH PROPOFOL;  Surgeon: Irving Copas., MD;  Location: [place];  Service: Gastroenterology;  Laterality:  N/A;   HERNIA REPAIR   2008, 2010    hiatal hernia and 1 additional   NASAL SEPTUM SURGERY       PANCREATIC PSEUDOCYST DRAINAGE       PANCREATIC STENT PLACEMENT   12/06/2020    Procedure: PANCREATIC STENT PLACEMENT;  Surgeon: Irving Copas., MD;  Location: [place];  Service: Gastroenterology;;   PLEURAL EFFUSION DRAINAGE Right 01/10/2021    Procedure: DRAINAGE OF PLEURAL EFFUSION;  Surgeon: Gaye Pollack, MD;  Location: [place];  Service: Thoracic;  Laterality: Right;   REMOVAL OF STONES   12/06/2020    Procedure: REMOVAL OF STONES;  Surgeon: Irving Copas., MD;  Location: [place];  Service: Gastroenterology;;   Christopher Fernandez   12/06/2020    Procedure: Christopher Fernandez;  Surgeon: Irving Copas., MD;  Location: [place];  Service: Gastroenterology;;   SPLENECTOMY   2006   STUMP REVISION Right 11/24/2019    Procedure: REVISION RIGHT TRANSMETATARSAL AMPUTATION;  Surgeon: Newt Minion, MD;  Location: [place];  Service: Orthopedics;  Laterality: Right;   TEE WITHOUT CARDIOVERSION N/A 12/04/2020    Procedure: TRANSESOPHAGEAL ECHOCARDIOGRAM (TEE);  Surgeon: Acie Fredrickson Wonda Cheng, MD;  Location[facility];  Service: Cardiovascular;  Laterality: N/A;   THORACIC EXPOSURE Right 01/03/2021    Procedure: THORACIC EXPOSURE with removal of rib;  Surgeon: Gaye Pollack, MD;  Location: [place];  Service: Cardiothoracic;  Laterality: Right;   THORACOTOMY Right 01/10/2021    Procedure: REDO THORACOTOMY;  Surgeon: Gaye Pollack, MD;  Location: [facility] OR;  Service: Thoracic;  Laterality: Right;   Christopher VERTEBRECTOMY Right 01/03/2021    Procedure: Decompression of  Thoracic nine and Thoracic ten via corpectomy reconstruction with titanium spacer and rib autogrtaft. Lateral plate fixation Thoracic eight - Thoracic eleven;  Surgeon: Kristeen Miss, MD;  Location: [place];  Service: Neurosurgery;  Laterality: Right;   TOTAL KNEE ARTHROPLASTY Right 1982, 1984   TRANSMETATARSAL  AMPUTATION Left 09/06/2020    Procedure: AMPUTATION LEFT GREAT TOE;  Surgeon: Waynetta Sandy, MD;  Location: [place];  Service: Vascular;  Laterality: Left;   WOUND DEBRIDEMENT Right 01/10/2020    Procedure: incisional DEBRIDEMENT of RIGHT TRANSMETATARSAL WOUND;  Surgeon: [name], MD;  Location: [facility] OR;  Service: Vascular;  Laterality: Right;           Family History  Problem Relation Age of Onset   Diabetes Mother     Hypertension Mother     Hyperlipidemia Mother     Kidney  disease Mother     Thyroid disease Mother     Breast cancer Mother          mets   Lung cancer Mother     Diabetes Father     Alcohol abuse Sister     Sickle cell trait Sister     Diabetes Brother     Asthma Brother     Lung cancer Maternal Grandmother     Kidney disease Maternal Grandmother     Liver cancer Maternal Uncle          x 4-5   Kidney disease Maternal Uncle     Kidney disease Paternal Uncle     Colon cancer Neg Hx     Colon polyps Neg Hx     Esophageal cancer Neg Hx     Rectal cancer Neg Hx     Stomach cancer Neg Hx        SOCIAL HISTORY: Social History         Tobacco Use   Smoking status: Current Every Day Smoker      Years: 38.00      Types: Cigarettes      Start date: 09/14/1982   Smokeless tobacco: Never Used   Tobacco comment: 2-3 cigarettes a day  Substance Use Topics   Alcohol use: Not Currently      Comment: 2-3 per day, none since 2019           Allergies  Allergen Reactions   Eggs Or Egg-Derived Products Rash   Morphine And Related Other (See Comments)      Cant take because of pancreatitis   [place] Rash            Current Outpatient Medications  Medication Sig Dispense Refill   acetaminophen (TYLENOL) 500 MG tablet Take 2 tablets (1,000 mg total) by mouth every 6 (six) hours. 30 tablet 0   albuterol (VENTOLIN HFA) 108 (90 Base) MCG/ACT inhaler INHALE 2 TO 4 PUFFS INTO THE LUNGS EVERY 4 HOURS AS NEEDED FOR WHEEZING OR COUGH. (Patient taking  differently: Inhale 2-4 puffs into the lungs every 4 (four) hours as needed for wheezing or shortness of breath.) 18 g 11   ARIPiprazole (ABILIFY) 5 MG tablet TAKE 1 TABLET (5 MG TOTAL) BY MOUTH DAILY. (Patient taking differently: Take 5 mg by mouth every evening.) 90 tablet 2   atorvastatin (LIPITOR) 40 MG tablet Take 1 tablet (40 mg total) by mouth daily. 90 tablet 3   baclofen (LIORESAL) 10 MG tablet Take 1 tablet (10 mg total) by mouth 3 (three) times daily as needed for muscle spasms. 60 each 0   bismuth subsalicylate (PEPTO BISMOL) 262 MG/15ML suspension Take 30 mLs by mouth every 6 (six) hours as needed for indigestion or diarrhea or loose stools.       cetirizine (ZYRTEC) 10 MG tablet Take 1 tablet (10 mg total) by mouth daily. (Patient taking differently: Take 10 mg by mouth daily as needed for allergies.) 30 tablet 11   Continuous Blood Gluc Receiver (DEXCOM G6 RECEIVER) DEVI 1 Device by Does not apply route 4 (four) times daily. 1 each 11   Continuous Blood Gluc Transmit (DEXCOM G6 TRANSMITTER) MISC 4 (four) times daily. as directed       cyclobenzaprine (FLEXERIL) 10 MG tablet Take 1 tablet (10 mg total) by mouth 3 (three) times daily. 60 tablet 0   DULoxetine (CYMBALTA) 60 MG capsule TAKE 1 CAPSULE (60 MG TOTAL) BY MOUTH DAILY. (Patient taking differently: Take  60 mg by mouth daily.) 90 capsule 2   EASY TOUCH PEN NEEDLES 31G X 5 MM MISC Inject 1 application as directed 3 (three) times daily. 100 each 11   empagliflozin (JARDIANCE) 25 MG TABS tablet TAKE 1 TABLET BY MOUTH DAILY. (Patient taking differently: Take 25 mg by mouth daily.) 90 tablet 3   Fluticasone-Salmeterol (ADVAIR) 250-50 MCG/DOSE AEPB INHALE 1 PUFF INTO THE LUNGS 2 TIMES DAILY. (Patient taking differently: Inhale 1 puff into the lungs in the morning and at bedtime.) 180 each 3   gabapentin (NEURONTIN) 300 MG capsule TAKE 2 CAPSULES (600 MG TOTAL) BY MOUTH 3 (THREE) TIMES DAILY. (Patient taking differently: Take 600 mg by  mouth 3 (three) times daily.) 180 capsule 2   glucose blood test strip USE AS INSTRUCTED 100 strip 12   ibuprofen (ADVIL) 400 MG tablet Take 1 tablet (400 mg total) by mouth every 8 (eight) hours as needed for mild pain. 60 tablet 0   insulin lispro (HUMALOG) 100 UNIT/ML KwikPen INJECT 6-10 UNITS INTO THE SKIN 3 (THREE) TIMES DAILY. (Patient taking differently: Inject 6-10 Units into the skin 3 (three) times daily. Sliding scale not provided) 15 mL 11   Lancets (FREESTYLE) lancets Use as instructed 100 each 12   lidocaine (LIDODERM) 5 % PLACE 1 PATCH ONTO THE SKIN DAILY. REMOVE & DISCARD PATCH WITHIN 12 HOURS OR AS DIRECTED BY MD (Patient taking differently: Place 1 patch onto the skin daily. Remove & discard patch within 12 hours or as directed by MD) 30 patch 0   metFORMIN (GLUCOPHAGE) 1000 MG tablet TAKE 1 TABLET (1,000 MG TOTAL) BY MOUTH 2 (TWO) TIMES DAILY WITH A MEAL. (Patient taking differently: Take 1,000 mg by mouth 2 (two) times daily with a meal.) 180 tablet 3   mirtazapine (REMERON) 30 MG tablet TAKE 1 TABLET (30 MG TOTAL) BY MOUTH AT BEDTIME. (Patient taking differently: Take 30 mg by mouth at bedtime.) 90 tablet 3   montelukast (SINGULAIR) 10 MG tablet TAKE 1 TABLET (10 MG TOTAL) BY MOUTH AT BEDTIME. (Patient taking differently: Take 10 mg by mouth in the morning.) 90 tablet 11   ondansetron (ZOFRAN ODT) 4 MG disintegrating tablet Take 1 tablet (4 mg total) by mouth every 8 (eight) hours as needed for nausea or vomiting. 20 tablet 0   oxyCODONE-acetaminophen (PERCOCET) 10-325 MG tablet Take 1 tablet by mouth every 6 (six) hours as needed. 60 tablet 0   PANCRELIPASE, LIP-PROT-AMYL, PO Take 36,000 Units by mouth with breakfast, with lunch, and with evening meal.       pantoprazole (PROTONIX) 40 MG tablet TAKE 1 TABLET (40 MG TOTAL) BY MOUTH 2 (TWO) TIMES DAILY. (Patient taking differently: Take 40 mg by mouth 2 (two) times daily.) 60 tablet 0   propranolol ER (INDERAL LA) 80 MG 24 hr capsule  TAKE 1 CAPSULE (80 MG TOTAL) BY MOUTH DAILY. (Patient taking differently: Take 80 mg by mouth daily.) 90 capsule 1   rifampin (RIFADIN) 300 MG capsule Take 1 capsule (300 mg total) by mouth 2 (two) times daily. 100 capsule 0   senna-docusate (SENOKOT-S) 8.6-50 MG tablet Take 1 tablet by mouth at bedtime. 30 tablet 0   sitaGLIPtin (JANUVIA) 100 MG tablet TAKE 1 TABLET (100 MG TOTAL) BY MOUTH DAILY. (Patient taking differently: Take 100 mg by mouth daily.) 90 tablet 10   vancomycin HCl (VANCOREADY) 1500 MG/300ML SOLN Inject 300 mLs (1,500 mg total) into the vein every 12 (twelve) hours. 30000 mL 0   vancomycin IVPB  Inject 1,500 mg into the vein every 12 (twelve) hours. Indication:  Disseminated MRSA infection First Dose: No Last Day of Therapy:  02/25/2021 Labs - Sunday/Monday:  CBC/D, BMP, and vancomycin trough. Labs - Thursday:  BMP and vancomycin trough Labs - Every other week:  ESR and CRP Method of administration:Elastomeric Method of administration may be changed at the discretion of the patient and/or caregiver's ability to self-administer the medication ordered. 86 Units 0    No current facility-administered medications for this visit.      REVIEW OF SYSTEMS:  _0  denotes positive finding, _1  denotes negative finding Cardiac   Comments:  Chest pain or chest pressure:      Shortness of breath upon exertion:      Short of breath when lying flat:      Irregular heart rhythm:             Vascular      Pain in calf, thigh, or hip brought on by ambulation:      Pain in feet at night that wakes you up from your sleep:      Blood clot in your veins:      Leg swelling:             Pulmonary      Oxygen at home:      Productive cough:      Wheezing:             Neurologic      Sudden weakness in arms or legs:      Sudden numbness in arms or legs:      Sudden onset of difficulty speaking or slurred speech:      Temporary loss of vision in one eye:      Problems with dizziness:              Gastrointestinal      Blood in stool:      Vomited blood:             Genitourinary      Burning when urinating:      Blood in urine:             Psychiatric      Major depression:             Hematologic      Bleeding problems:      Problems with blood clotting too easily:             Skin      Rashes or ulcers:             Constitutional      Fever or chills:          PHYSICAL EXAM:    Vitals:    02/11/21 1043  BP: 128/89  Pulse: 96  Temp: 97.7 F (36.5 C)  TempSrc: Skin  SpO2: 95%  Weight: 232 lb 4.8 oz (105.4 kg)  Height: _2  (1.905 m)      GENERAL: The patient is a well-nourished male, in no acute distress. The vital signs are documented above. CARDIAC: There is a regular rate and rhythm. VASCULAR:  Left DP palpable Left 1st/2nd toe amputations healed Left 3rd toe with open draining wound Right TMA now healed          DATA:    None   Assessment/Plan:   50 year old male with history of MRSA infection and MRSA bacteremia now with what appears to be osteomyelitis and open wound  of his left third toe.  I recommended a left third toe amputation.  He has a palpable dorsalis pedis pulse in the foot and he has previously healed a left great toe and second toe amputation.  He is already on IV antibiotics with ID given his epidural abscess and recent thoracotomy with reconstruction of his spinal osteomyelitis.  We will get him scheduled for early next week.     [name], MD [facility] Office: [phone]

## 2021-02-24 NOTE — Progress Notes (Addendum)
Patient on Metoprolol for tremors and not BP.  Spoke to Dr. Lissa Hoard, he ordered to administer the Metoprolol now. Informed Dr. Lissa Hoard of BG 232.  No order received.

## 2021-02-24 NOTE — Anesthesia Procedure Notes (Signed)
Anesthesia Regional Block: Popliteal block   Pre-Anesthetic Checklist: , timeout performed,  Correct Patient, Correct Site, Correct Laterality,  Correct Procedure, Correct Position, site marked,  Risks and benefits discussed,  Pre-op evaluation,  At surgeon's request and post-op pain management  Laterality: Left  Prep: chloraprep       Needles:  Injection technique: Single-shot  Needle Type: Stimiplex     Needle Length: 10cm  Needle Gauge: 21     Additional Needles:   Procedures:,,,, ultrasound used (permanent image in chart),,   Motor weakness within 5 minutes.  Narrative:  Start time: 02/24/2021 9:15 AM End time: 02/24/2021 9:32 AM Injection made incrementally with aspirations every 5 mL.  Performed by: Personally  Anesthesiologist: Nolon Nations, MD  Additional Notes: Nerve located and needle positioned with direct ultrasound guidance. Good perineural spread. Patient tolerated well.

## 2021-02-24 NOTE — Anesthesia Procedure Notes (Signed)
Procedure Name: MAC Date/Time: 02/24/2021 10:30 AM Performed by: Harden Mo, CRNA Pre-anesthesia Checklist: Patient identified, Emergency Drugs available, Suction available and Patient being monitored Patient Re-evaluated:Patient Re-evaluated prior to induction Oxygen Delivery Method: Simple face mask Preoxygenation: Pre-oxygenation with 100% oxygen Induction Type: IV induction Placement Confirmation: positive ETCO2 and breath sounds checked- equal and bilateral Dental Injury: Teeth and Oropharynx as per pre-operative assessment

## 2021-02-24 NOTE — Telephone Encounter (Signed)
Dexcom transmitter has been approved. See below for details. The pharmacy has been updated.

## 2021-02-24 NOTE — Progress Notes (Signed)
Dr. Carlis Abbott ordered Vanc 1500mg  once to be given now in short stay.

## 2021-02-24 NOTE — Anesthesia Preprocedure Evaluation (Addendum)
Anesthesia Evaluation  Patient identified by MRN, date of birth, ID band Patient awake    Reviewed: Allergy & Precautions, NPO status , Patient's Chart, lab work & pertinent test results  Airway Mallampati: II  TM Distance: >3 FB Neck ROM: Full    Dental  (+) Teeth Intact, Dental Advisory Given   Pulmonary asthma , pneumonia, COPD, Current Smoker and Patient abstained from smoking.,    Pulmonary exam normal breath sounds clear to auscultation       Cardiovascular hypertension, Pt. on medications and Pt. on home beta blockers Normal cardiovascular exam Rhythm:Regular Rate:Normal  Echo 11/2020 1. Left ventricular ejection fraction, by estimation, is 55 to 60%. The left ventricle has normal function. The left ventricle has no regional wall motion abnormalities.  2. Right ventricular systolic function is normal. The right ventricular size is normal.  3. No left atrial/left atrial appendage thrombus was detected.  4. The mitral valve is normal in structure. Trivial mitral valve regurgitation.  5. The aortic valve is normal in structure. Aortic valve regurgitation is not visualized.    Neuro/Psych PSYCHIATRIC DISORDERS Anxiety Depression  Neuromuscular disease    GI/Hepatic Neg liver ROS, hiatal hernia, GERD  ,  Endo/Other  negative endocrine ROSdiabetes  Renal/GU negative Renal ROS     Musculoskeletal  (+) Arthritis ,   Abdominal   Peds  Hematology  (+) Blood dyscrasia, anemia ,   Anesthesia Other Findings   Reproductive/Obstetrics                            Anesthesia Physical Anesthesia Plan  ASA: 3  Anesthesia Plan: Regional   Post-op Pain Management:    Induction: Intravenous  PONV Risk Score and Plan: 0 and Propofol infusion, Treatment may vary due to age or medical condition, Ondansetron and Midazolam  Airway Management Planned: Natural Airway  Additional Equipment:    Intra-op Plan:   Post-operative Plan:   Informed Consent: I have reviewed the patients History and Physical, chart, labs and discussed the procedure including the risks, benefits and alternatives for the proposed anesthesia with the patient or authorized representative who has indicated his/her understanding and acceptance.     Dental advisory given  Plan Discussed with: CRNA  Anesthesia Plan Comments:        Anesthesia Quick Evaluation

## 2021-02-24 NOTE — Transfer of Care (Signed)
Immediate Anesthesia Transfer of Care Note  Patient: ABANOUB HANKEN  Procedure(s) Performed: PARTIAL LEFT THIRD TOE AMPUTATION (Left)  Patient Location: PACU  Anesthesia Type:MAC and Regional  Level of Consciousness: awake, alert  and drowsy  Airway & Oxygen Therapy: Patient Spontanous Breathing and Patient connected to nasal cannula oxygen  Post-op Assessment: Report given to RN, Post -op Vital signs reviewed and stable and Patient moving all extremities X 4  Post vital signs: Reviewed and stable  Last Vitals:  Vitals Value Taken Time  BP    Temp    Pulse 76 02/24/21 1104  Resp 12 02/24/21 1104  SpO2 100 % 02/24/21 1104  Vitals shown include unvalidated device data.  Last Pain:  Vitals:   02/24/21 0741  TempSrc: Oral         Complications: No notable events documented.

## 2021-02-25 ENCOUNTER — Encounter (HOSPITAL_COMMUNITY): Payer: Self-pay | Admitting: Vascular Surgery

## 2021-02-25 ENCOUNTER — Other Ambulatory Visit: Payer: Self-pay | Admitting: *Deleted

## 2021-02-25 DIAGNOSIS — M4644 Discitis, unspecified, thoracic region: Secondary | ICD-10-CM | POA: Diagnosis not present

## 2021-02-25 DIAGNOSIS — M86151 Other acute osteomyelitis, right femur: Secondary | ICD-10-CM | POA: Diagnosis not present

## 2021-02-25 DIAGNOSIS — M869 Osteomyelitis, unspecified: Secondary | ICD-10-CM | POA: Diagnosis not present

## 2021-02-25 DIAGNOSIS — M4624 Osteomyelitis of vertebra, thoracic region: Secondary | ICD-10-CM | POA: Diagnosis not present

## 2021-02-25 DIAGNOSIS — E785 Hyperlipidemia, unspecified: Secondary | ICD-10-CM | POA: Diagnosis not present

## 2021-02-25 DIAGNOSIS — E1169 Type 2 diabetes mellitus with other specified complication: Secondary | ICD-10-CM | POA: Diagnosis not present

## 2021-02-25 NOTE — Anesthesia Postprocedure Evaluation (Signed)
Anesthesia Post Note  Patient: Christopher Fernandez  Procedure(s) Performed: PARTIAL LEFT THIRD TOE AMPUTATION (Left)     Patient location during evaluation: PACU Anesthesia Type: Regional Level of consciousness: awake and alert Pain management: pain level controlled Vital Signs Assessment: post-procedure vital signs reviewed and stable Respiratory status: spontaneous breathing Cardiovascular status: stable Anesthetic complications: no   No notable events documented.  Last Vitals:  Vitals:   02/24/21 1120 02/24/21 1140  BP: 125/89 122/89  Pulse: 82 88  Resp: 16 16  Temp:  (!) 36.3 C  SpO2: 100% 97%    Last Pain:  Vitals:   02/24/21 1140  TempSrc:   PainSc: 2                  Nolon Nations

## 2021-02-25 NOTE — Patient Outreach (Signed)
[facility]) Care Management  02/25/2021  Christopher Fernandez [birth year] [id number]   Transition of Care follow up /Case Closure   Referral received: 01/17/21 Initial outreach:01/21/21 Insurance: [place] [employer]   Subjective: Christopher Fernandez states that he is doing okay, he discussed recent left partial third toe amputation on yesterday. He reports site with dressing in place, no signs of increase drainage/redness noted. He reports pain managed with prn medications. He reports tolerating mobility in the home with using a cane.  Patient discussed [facility] visit on today and possibility of tomorrow being his last dose of IV antibiotics depending on labs drawn on today.   He discussed incision area from previous thoracotomy surgery has healed he just has concern for regarding swelling /discomfort at site he has scheduled [place] test this week.  Patient discussed blood sugars under control with reading in the 130-150's he denies hypoglycemia readings.  He discussed being active with telephone health coach for Diabetes diseased management education support through Active health management.   Christopher Fernandez denies any other care concerns at this time, for CM care coordination. He agrees to case closure.  Objective:  Christopher Fernandez  was hospitalized at [facility] 4/15-01/17/21 for Osteomyelitis with new abscess/phlegmon status post T8-11 lateral fusion and T9-10 corpectomy Right loculated pleural effusion status post thoracotomy, Osteomyelitis right hip/pubis   Comorbidities include: Type 2 Diabetes, peripheral neuropathy , chronic pancreatitis/pancreatic insufficiency, hyperlipidemia hypertension , amputation left first toe, right metatarsal amputation.  He was discharged to home on 01/17/21 with [facility] services RN, PT/OT, [facility] IV antibiotic  home infusion  and with DME of Tub bench and 3 in 1.  Positive home covid test on 01/27/21 symptoms starting 5/13. Partial left 3rd toe amputation on  02/24/21 at [facility], outpatient .    Plan Will close case to [facility] care management , patient is enrolled with active health management for ongoing chronic disease management .    Joylene Draft, RN, BSN  [place] Management Coordinator  [phone]- Mobile [phone]- Toll Free Main Office

## 2021-02-26 ENCOUNTER — Telehealth: Payer: Self-pay

## 2021-02-26 NOTE — Telephone Encounter (Signed)
Received call from patient, he states that he is concerned about his labs and would like the provider to review them before he completes IV vancomycin therapy. Will route to provider.   Labs available in [place]:  02/25/21 ESR: 63 CRP: 6  02/19/21 ESR: 35 CRP: 5  Greg Cratty D Santina Trillo, RN

## 2021-02-27 ENCOUNTER — Other Ambulatory Visit (HOSPITAL_COMMUNITY): Payer: Self-pay

## 2021-02-27 DIAGNOSIS — M86151 Other acute osteomyelitis, right femur: Secondary | ICD-10-CM | POA: Diagnosis not present

## 2021-02-27 DIAGNOSIS — E785 Hyperlipidemia, unspecified: Secondary | ICD-10-CM | POA: Diagnosis not present

## 2021-02-27 DIAGNOSIS — E1169 Type 2 diabetes mellitus with other specified complication: Secondary | ICD-10-CM | POA: Diagnosis not present

## 2021-02-27 DIAGNOSIS — M4644 Discitis, unspecified, thoracic region: Secondary | ICD-10-CM | POA: Diagnosis not present

## 2021-02-27 DIAGNOSIS — M4624 Osteomyelitis of vertebra, thoracic region: Secondary | ICD-10-CM | POA: Diagnosis not present

## 2021-02-27 NOTE — Telephone Encounter (Signed)
Thanks Megan!

## 2021-02-27 NOTE — Telephone Encounter (Signed)
Relayed verbal orders to [facility] that okay to pull PICC per Dr. Tommy Medal. Orders repeated and verified.   Called patient, advised him that Dr. Tommy Medal is okay with the PICC line coming out and that he needs to continue with oral antibiotics as discussed. Patient verbalized understanding and has no further questions.   Beryle Flock, RN

## 2021-02-28 ENCOUNTER — Ambulatory Visit
Admission: RE | Admit: 2021-02-28 | Discharge: 2021-02-28 | Disposition: A | Discharge status: Home / Self Care | Payer: 59 | Source: Ambulatory Visit | Attending: Infectious Disease | Admitting: Infectious Disease

## 2021-02-28 DIAGNOSIS — R222 Localized swelling, mass and lump, trunk: Secondary | ICD-10-CM

## 2021-02-28 DIAGNOSIS — R600 Localized edema: Secondary | ICD-10-CM | POA: Diagnosis not present

## 2021-03-04 NOTE — Progress Notes (Signed)
Subjective:   Patient ID: Christopher Fernandez    DOB: 01-Sep-1971, 50 y.o. male   MRN: [id number]  Christopher Fernandez is a 50 y.o. male with a history of pericardial effusion, HTN, asthma, COPD, GERD, exocrine pancreatic insufficiency, chronic pancreatitis, choledocholithiasis, biliary sludge, T2DM, HLD, diabetic neuropathy, multiple diabetic foot infections s/p amputations, epidural abscess, vertebral osteomyelitis, h/o infective myositis/discititis/pelvic region/thigh, BPH, thrombocytosis, tobacco use disorder, RLS, h/o MRSA infection and bacteremia, ED, constipation, asplenia, anxiety/depression, amputation, alcohol use disorder, action tremor, abnormal ABI followed by [facility] here for follow up  Acute Concerns; 1. Would like increase Gabapentin. Had increased on his own to 9110m and it helps with this pain and neuropathy. 2. Intermittent nausea since leaving hospital. Denies vomiting. Eating and drinking normally. Requests another short course of zofran. 3. Esophageal Candidiasis: history of having this multiple times. Most recently in March 2022. Treated with Fluconazole given he failed with the oral solution. He feels like he is having another flare as he is having similar symptoms in the past.  4. Bilateral Low back pain since Thursday that radiates up his right side. Continues to have intermittent sweats.   History of extensive osteomyelitis of pelvic region, thigh, vertebra, multiple bilateral toes, and epidural abscess with history of MRSA bacteremia s/p back surgery and thoracotomy. Followed by ID. Just transitioned from long term IV vanc to PO Doxycycline and Rifampin. He was having some pain around surgical sight. ID obtained [place]hest which showed "minimal SQ edema at incision site, no fluid collection or abscess. He also just underwent partial left 3rd toe amputation by [facility] without complication due to findings of osteomyelitis. He notes things are going well.   Diabetes: Last three A1C's below.  Currently on Lantus 10U, Humalog 2-6U with meals, Metformin 10057mBID, Jardiance 2541mD and Januvia 100m22m. Endorses compliance. Notes CBGs range 165 first ting in the morning, range 140-180. Denies any hypoglycemia. Denies any polyuria, polydipsia, polyphagia.   Lab Results  Component Value Date   HGBA1C 9.1 (H) 12/27/2020   HGBA1C 8.7 (A) 12/23/2020   HGBA1C 9.7 (A) 10/23/2020    Review of Systems:  Per HPI.   Objective:   BP 102/70   Pulse 94   Ht _0  (1.905 m)   Wt 230 lb (104.3 kg)   SpO2 96%   BMI 28.75 kg/m  Vitals and nursing note reviewed.  General: pleasant older gentleman, sitting comfortably in exam chair, well nourished, well developed, in no acute distress with non-toxic appearance HEENT: no white patches appreciated on exam,MMM, oropharynx without erythema or exudate CV: regular rate and rhythm without murmurs, rubs, or gallops Lungs: clear to auscultation bilaterally with normal work of breathing on room air, speaking in full sentences Skin: warm, dry, well healing scar on right posterior lateral mid back  Extremities: warm and well perfused, normal tone MSK: walks with cane, palpation to lower back unable to reproduce pain, no skin changes or swelling appreciated on entire inspection of back. Neuro: Alert and oriented, speech normal  Assessment & Plan:   Type 2 diabetes mellitus with diabetic neuropathy, with long-term current use of insulin (HCC) Chronic, improved CBG's.  Continue current regimen.  Follow up 1 month for A1C Plan is to transition from DDP-4 to GLP-1 given better evidence however would like for acute infections and weight to stabilize prior to transition  Vertebral osteomyelitis (HCC)Norgecently transitioned from IV vanc to PO Doxycycline and Rifampin. Endorses new low back pain that radiates up right  lateral back since Thursday. Continues to have intermittent cold sweats. Pain is not reproducable on exam. Had MRI of lumbar spine on 02/17/21  that noted improving osteomyelitis. Hemodynamically stable on exam today. Possibly MSK however given history will start with CBC w/ diff and ESR/CRP at this time. Pending results will discuss with ID regarding need for repeat imaging.   Action tremor Chronic. Currently on Metoprolol 178m QD (equivalent to Propranolol 819mQD) but notes minimal improvement anymore. Patient's blood pressure is a little soft thus I'm hesitant to double the medication. Recommended trial of Metoprolol 1504mD. He was instructed to monitor BP. If BP doesn't tolerate or if no improvement then can consider transition to Topiramate. Currently already on Gabapentin which is also recommended for treatment. Can also consider referral to neurology. Follow up 1 month.  Diabetic neuropathy (HCC) Has self increased gabapentin and noted improved symptom release with Gabapentin 900m36mWill increase to 900mg82m.   Esophageal candidiasis (HCC) Acute. Has had endoscopy proven cases of this in the past. Endorses similar symptoms to past infection.  Will empirically treat with Fluconazole 400mg 83may 1 then 200mg o76my 2-14.  Follow up if no improvement.  Nausea: Acute, intermittent. Short course of Zofran 4mg PRN31mOrders Placed This Encounter  Procedures   Sedimentation Rate   C-reactive protein   CBC with Differential    Meds ordered this encounter  Medications   DISCONTD: gabapentin (NEURONTIN) 300 MG capsule    Sig: Take 1 capsule (300 mg total) by mouth 3 (three) times daily.    Dispense:  90 capsule    Refill:  3   ondansetron (ZOFRAN ODT) 4 MG disintegrating tablet    Sig: Take 1 tablet (4 mg total) by mouth every 8 (eight) hours as needed for nausea or vomiting.    Dispense:  20 tablet    Refill:  0   fluconazole (DIFLUCAN) 200 MG tablet    Sig: Take 2 tablets (400mg) on84m 1, then 1 tablet (200mg) on 90m2-14.    Dispense:  15 tablet    Refill:  0   gabapentin (NEURONTIN) 300 MG capsule    Sig: Take 3  capsules (900 mg total) by mouth 3 (three) times daily.    Dispense:  270 capsule    Refill:  2    MD approves a 30d supply with 2 rf. ECM 03/06/21      Keelin Sheridan MMina Marble, [facility] 03/06/2021 3:03 PM

## 2021-03-06 ENCOUNTER — Encounter: Payer: Self-pay | Admitting: Family Medicine

## 2021-03-06 ENCOUNTER — Telehealth: Payer: Self-pay

## 2021-03-06 ENCOUNTER — Other Ambulatory Visit: Payer: Self-pay

## 2021-03-06 ENCOUNTER — Ambulatory Visit: Payer: 59 | Admitting: Family Medicine

## 2021-03-06 ENCOUNTER — Other Ambulatory Visit (HOSPITAL_COMMUNITY): Payer: Self-pay

## 2021-03-06 VITALS — BP 102/70 | HR 94 | Ht 75.0 in | Wt 230.0 lb

## 2021-03-06 DIAGNOSIS — G252 Other specified forms of tremor: Secondary | ICD-10-CM | POA: Diagnosis not present

## 2021-03-06 DIAGNOSIS — M4624 Osteomyelitis of vertebra, thoracic region: Secondary | ICD-10-CM | POA: Diagnosis not present

## 2021-03-06 DIAGNOSIS — M4620 Osteomyelitis of vertebra, site unspecified: Secondary | ICD-10-CM

## 2021-03-06 DIAGNOSIS — R110 Nausea: Secondary | ICD-10-CM

## 2021-03-06 DIAGNOSIS — E1140 Type 2 diabetes mellitus with diabetic neuropathy, unspecified: Secondary | ICD-10-CM

## 2021-03-06 DIAGNOSIS — E785 Hyperlipidemia, unspecified: Secondary | ICD-10-CM | POA: Diagnosis not present

## 2021-03-06 DIAGNOSIS — Z794 Long term (current) use of insulin: Secondary | ICD-10-CM

## 2021-03-06 DIAGNOSIS — E1142 Type 2 diabetes mellitus with diabetic polyneuropathy: Secondary | ICD-10-CM | POA: Diagnosis not present

## 2021-03-06 DIAGNOSIS — B3781 Candidal esophagitis: Secondary | ICD-10-CM

## 2021-03-06 DIAGNOSIS — M4644 Discitis, unspecified, thoracic region: Secondary | ICD-10-CM | POA: Diagnosis not present

## 2021-03-06 DIAGNOSIS — E1169 Type 2 diabetes mellitus with other specified complication: Secondary | ICD-10-CM | POA: Diagnosis not present

## 2021-03-06 DIAGNOSIS — M86151 Other acute osteomyelitis, right femur: Secondary | ICD-10-CM | POA: Diagnosis not present

## 2021-03-06 MED ORDER — GABAPENTIN 300 MG PO CAPS
900.0000 mg | ORAL_CAPSULE | Freq: Three times a day (TID) | ORAL | 2 refills | Status: DC
  Filled 2021-03-06: qty 270, 30d supply, fill #0
  Filled 2021-04-14: qty 270, 30d supply, fill #1
  Filled 2021-06-16: qty 270, 30d supply, fill #2

## 2021-03-06 MED ORDER — ONDANSETRON 4 MG PO TBDP
4.0000 mg | ORAL_TABLET | Freq: Three times a day (TID) | ORAL | 0 refills | Status: DC | PRN
  Filled 2021-03-06: qty 20, 7d supply, fill #0

## 2021-03-06 MED ORDER — GABAPENTIN 300 MG PO CAPS
300.0000 mg | ORAL_CAPSULE | Freq: Three times a day (TID) | ORAL | 3 refills | Status: DC
  Filled 2021-03-06: qty 90, 30d supply, fill #0

## 2021-03-06 MED ORDER — FLUCONAZOLE 200 MG PO TABS
ORAL_TABLET | ORAL | 0 refills | Status: DC
  Filled 2021-03-06: qty 15, 14d supply, fill #0

## 2021-03-06 MED FILL — Empagliflozin Tab 25 MG: ORAL | 90 days supply | Qty: 90 | Fill #0 | Status: AC

## 2021-03-06 MED FILL — Montelukast Sodium Tab 10 MG (Base Equiv): ORAL | 90 days supply | Qty: 90 | Fill #0 | Status: AC

## 2021-03-06 MED FILL — Mirtazapine Tab 30 MG: ORAL | 90 days supply | Qty: 90 | Fill #0 | Status: AC

## 2021-03-06 MED FILL — Sitagliptin Phosphate Tab 100 MG (Base Equiv): ORAL | 90 days supply | Qty: 90 | Fill #0 | Status: AC

## 2021-03-06 NOTE — Telephone Encounter (Signed)
I apologize. The order changed without me realizing it. I have corrected the order and resent it to the pharmacy.

## 2021-03-06 NOTE — Telephone Encounter (Signed)
Patient LVM on nurse line stating he understood his gabapentin would be increased to TID. I called the pharmacy as the recent sig reflects this, however they stated the dose went down. Per pharmacy he was taking 600mg  BID. Please clarify.

## 2021-03-06 NOTE — Assessment & Plan Note (Signed)
Acute. Has had endoscopy proven cases of this in the past. Endorses similar symptoms to past infection.  Will empirically treat with Fluconazole 400mg  on day 1 then 200mg  on day 2-14.  Follow up if no improvement.

## 2021-03-06 NOTE — Assessment & Plan Note (Addendum)
Chronic, improved CBG's.  Continue current regimen.  Follow up 1 month for A1C Plan is to transition from DDP-4 to GLP-1 given better evidence however would like for acute infections and weight to stabilize prior to transition

## 2021-03-06 NOTE — Patient Instructions (Signed)
Diabetes: Continue your current medications as prescribed.  Follow-up in 8month Dr. Edwina Barth and get updated A1c Nausea: I have called in Zofran for your nausea Thrush: I have called in fluconazole for your possible thrush Tremor: Please increase your metoprolol to 150 mg (1.5 tablets).  Please monitor your blood pressure and backoff this medication if your blood pressure gets too soft. Pain: I have called in a higher dose of gabapentin to help with your pain Back pain: I am getting lab work today due to your new back pain.  I will call you if there is abnormalities and recommendations.

## 2021-03-06 NOTE — Assessment & Plan Note (Addendum)
Recently transitioned from IV vanc to PO Doxycycline and Rifampin. Endorses new low back pain that radiates up right lateral back since Thursday. Continues to have intermittent cold sweats. Pain is not reproducable on exam. Had MRI of lumbar spine on 02/17/21 that noted improving osteomyelitis. Hemodynamically stable on exam today. Possibly MSK however given history will start with CBC w/ diff and ESR/CRP at this time. Pending results will discuss with ID regarding need for repeat imaging.

## 2021-03-06 NOTE — Assessment & Plan Note (Signed)
Chronic. Currently on Metoprolol 100mg  QD (equivalent to Propranolol 80mg  QD) but notes minimal improvement anymore. Patient's blood pressure is a little soft thus I'm hesitant to double the medication. Recommended trial of Metoprolol 150mg  QD. He was instructed to monitor BP. If BP doesn't tolerate or if no improvement then can consider transition to Topiramate. Currently already on Gabapentin which is also recommended for treatment. Can also consider referral to neurology. Follow up 1 month.

## 2021-03-06 NOTE — Assessment & Plan Note (Signed)
Has self increased gabapentin and noted improved symptom release with Gabapentin 900mg .  Will increase to 900mg  TID.

## 2021-03-07 LAB — CBC WITH DIFFERENTIAL/PLATELET
Basophils Absolute: 0 10*3/uL (ref 0.0–0.2)
Basos: 1 %
EOS (ABSOLUTE): 0.1 10*3/uL (ref 0.0–0.4)
Eos: 1 %
Hematocrit: 39.3 % (ref 37.5–51.0)
Hemoglobin: 12.7 g/dL — ABNORMAL LOW (ref 13.0–17.7)
Immature Grans (Abs): 0 10*3/uL (ref 0.0–0.1)
Immature Granulocytes: 1 %
Lymphocytes Absolute: 1.6 10*3/uL (ref 0.7–3.1)
Lymphs: 27 %
MCH: 28.6 pg (ref 26.6–33.0)
MCHC: 32.3 g/dL (ref 31.5–35.7)
MCV: 89 fL (ref 79–97)
Monocytes Absolute: 0.7 10*3/uL (ref 0.1–0.9)
Monocytes: 11 %
Neutrophils Absolute: 3.5 10*3/uL (ref 1.4–7.0)
Neutrophils: 59 %
Platelets: 411 10*3/uL (ref 150–450)
RBC: 4.44 x10E6/uL (ref 4.14–5.80)
RDW: 14.8 % (ref 11.6–15.4)
WBC: 5.8 10*3/uL (ref 3.4–10.8)

## 2021-03-07 LAB — C-REACTIVE PROTEIN: CRP: 3 mg/L (ref 0–10)

## 2021-03-07 LAB — SEDIMENTATION RATE: Sed Rate: 41 mm/hr — ABNORMAL HIGH (ref 0–30)

## 2021-03-10 ENCOUNTER — Other Ambulatory Visit (HOSPITAL_COMMUNITY): Payer: Self-pay

## 2021-03-11 ENCOUNTER — Other Ambulatory Visit (HOSPITAL_COMMUNITY): Payer: Self-pay

## 2021-03-11 ENCOUNTER — Telehealth: Payer: Self-pay

## 2021-03-11 MED FILL — Continuous Glucose System Sensor: 90 days supply | Qty: 9 | Fill #0 | Status: AC

## 2021-03-11 NOTE — Telephone Encounter (Signed)
Received fax from covermymeds requesting PA on dexcom sensors. Per covermymeds dexcom is approved until 02/20/2022. Called pharmacist. Reports that decom had been discontinued. Reinitiated rx for patient. Processed for $0 copay.   Talbot Grumbling, RN

## 2021-03-13 ENCOUNTER — Other Ambulatory Visit (HOSPITAL_COMMUNITY): Payer: Self-pay

## 2021-03-13 MED ORDER — CYCLOBENZAPRINE HCL 10 MG PO TABS
10.0000 mg | ORAL_TABLET | Freq: Three times a day (TID) | ORAL | 0 refills | Status: DC
  Filled 2021-03-13: qty 60, 20d supply, fill #0

## 2021-03-13 MED ORDER — OXYCODONE-ACETAMINOPHEN 10-325 MG PO TABS
1.0000 | ORAL_TABLET | Freq: Three times a day (TID) | ORAL | 0 refills | Status: DC | PRN
  Filled 2021-03-13: qty 50, 17d supply, fill #0

## 2021-03-14 ENCOUNTER — Other Ambulatory Visit (HOSPITAL_COMMUNITY): Payer: Self-pay

## 2021-03-14 MED FILL — Albuterol Sulfate Inhal Aero 108 MCG/ACT (90MCG Base Equiv): RESPIRATORY_TRACT | 17 days supply | Qty: 18 | Fill #0 | Status: AC

## 2021-03-14 MED FILL — Glucose Blood Test Strip: 25 days supply | Qty: 100 | Fill #0 | Status: AC

## 2021-03-18 DIAGNOSIS — Z9981 Dependence on supplemental oxygen: Secondary | ICD-10-CM | POA: Diagnosis not present

## 2021-03-18 DIAGNOSIS — R0689 Other abnormalities of breathing: Secondary | ICD-10-CM | POA: Diagnosis not present

## 2021-03-18 DIAGNOSIS — R238 Other skin changes: Secondary | ICD-10-CM | POA: Diagnosis not present

## 2021-03-18 DIAGNOSIS — T50901A Poisoning by unspecified drugs, medicaments and biological substances, accidental (unintentional), initial encounter: Secondary | ICD-10-CM | POA: Diagnosis not present

## 2021-03-18 DIAGNOSIS — Z743 Need for continuous supervision: Secondary | ICD-10-CM | POA: Diagnosis not present

## 2021-03-18 DIAGNOSIS — R4020 Unspecified coma: Secondary | ICD-10-CM | POA: Diagnosis not present

## 2021-03-18 DIAGNOSIS — R404 Transient alteration of awareness: Secondary | ICD-10-CM | POA: Diagnosis not present

## 2021-03-18 DIAGNOSIS — R402431 Glasgow coma scale score 3-8, in the field [EMT or ambulance]: Secondary | ICD-10-CM | POA: Diagnosis not present

## 2021-03-24 ENCOUNTER — Other Ambulatory Visit (HOSPITAL_COMMUNITY): Payer: Self-pay

## 2021-03-25 ENCOUNTER — Other Ambulatory Visit (HOSPITAL_COMMUNITY): Payer: Self-pay

## 2021-03-28 ENCOUNTER — Other Ambulatory Visit (HOSPITAL_COMMUNITY): Payer: Self-pay

## 2021-03-28 ENCOUNTER — Ambulatory Visit (INDEPENDENT_AMBULATORY_CARE_PROVIDER_SITE_OTHER): Payer: 59 | Admitting: Infectious Disease

## 2021-03-28 ENCOUNTER — Encounter: Payer: Self-pay | Admitting: Infectious Disease

## 2021-03-28 ENCOUNTER — Other Ambulatory Visit: Payer: Self-pay

## 2021-03-28 VITALS — BP 118/81 | HR 88 | Temp 98.3°F | Ht 75.0 in | Wt 229.0 lb

## 2021-03-28 DIAGNOSIS — L089 Local infection of the skin and subcutaneous tissue, unspecified: Secondary | ICD-10-CM | POA: Diagnosis not present

## 2021-03-28 DIAGNOSIS — B3781 Candidal esophagitis: Secondary | ICD-10-CM

## 2021-03-28 DIAGNOSIS — E11628 Type 2 diabetes mellitus with other skin complications: Secondary | ICD-10-CM | POA: Diagnosis not present

## 2021-03-28 DIAGNOSIS — R6889 Other general symptoms and signs: Secondary | ICD-10-CM

## 2021-03-28 DIAGNOSIS — Q8901 Asplenia (congenital): Secondary | ICD-10-CM | POA: Diagnosis not present

## 2021-03-28 DIAGNOSIS — G062 Extradural and subdural abscess, unspecified: Secondary | ICD-10-CM

## 2021-03-28 DIAGNOSIS — M869 Osteomyelitis, unspecified: Secondary | ICD-10-CM | POA: Diagnosis not present

## 2021-03-28 DIAGNOSIS — Z794 Long term (current) use of insulin: Secondary | ICD-10-CM

## 2021-03-28 DIAGNOSIS — M4620 Osteomyelitis of vertebra, site unspecified: Secondary | ICD-10-CM

## 2021-03-28 DIAGNOSIS — E1149 Type 2 diabetes mellitus with other diabetic neurological complication: Secondary | ICD-10-CM

## 2021-03-28 DIAGNOSIS — G252 Other specified forms of tremor: Secondary | ICD-10-CM

## 2021-03-28 DIAGNOSIS — E1140 Type 2 diabetes mellitus with diabetic neuropathy, unspecified: Secondary | ICD-10-CM

## 2021-03-28 DIAGNOSIS — A4902 Methicillin resistant Staphylococcus aureus infection, unspecified site: Secondary | ICD-10-CM | POA: Diagnosis not present

## 2021-03-28 DIAGNOSIS — Z89431 Acquired absence of right foot: Secondary | ICD-10-CM

## 2021-03-28 DIAGNOSIS — Z6828 Body mass index (BMI) 28.0-28.9, adult: Secondary | ICD-10-CM | POA: Diagnosis not present

## 2021-03-28 DIAGNOSIS — M60003 Infective myositis, unspecified right leg: Secondary | ICD-10-CM | POA: Diagnosis not present

## 2021-03-28 DIAGNOSIS — Z9889 Other specified postprocedural states: Secondary | ICD-10-CM

## 2021-03-28 MED ORDER — OXYCODONE-ACETAMINOPHEN 10-325 MG PO TABS
1.0000 | ORAL_TABLET | Freq: Three times a day (TID) | ORAL | 0 refills | Status: DC | PRN
  Filled 2021-03-28: qty 50, 17d supply, fill #0

## 2021-03-28 MED ORDER — CYCLOBENZAPRINE HCL 10 MG PO TABS
10.0000 mg | ORAL_TABLET | Freq: Three times a day (TID) | ORAL | 0 refills | Status: DC
  Filled 2021-03-28: qty 60, 20d supply, fill #0

## 2021-03-28 NOTE — Progress Notes (Signed)
Subjective:  Chief complaint: With some discomfort at his chest tube sites   Patient ID: Christopher Fernandez, male    DOB: 13-May-1971, 50 y.o.   MRN: [id number]  HPI  50 y.o. 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   He ultimately went to the operating room on April 22 and underwent vertebrectomy, and decompression of his epidural abscess with hardware reconstruction joint neurosurgical and cardiothoracic surgical involvement.   He then had to go back to the operating room on the 29th for redo thoracotomy due to an infected empyema.  Chest tubes were placed.   Unfortunately his MRSA isolate from the operating room was now found to be daptomycin resistant though still sensitive to vancomycin he was switched over to vancomycin along with rifampin given the hardware.   Chest tubes were removed towards the end of his hospital stay.  He now is actually complaining of some swelling and discomfort along the site where his chest tube was placed.  He has had deterioration of his third toe and going to undergo amputation with vascular surgery with Dr. Carlis Abbott on Monday.  We did obtain a repeat MRI of his pelvis due to the concerns he was still having pain in his thigh.  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  A month ago ore more h is wife had tested positive for COVID-19 and he himself again did. We prescribed him moluprinavir.  Said that he felt dramatically better after  starting the moluprinavir and that he only had a lingering cough for a little while afterwards.  In the interim he has had partial third toe amputation performed by Dr. Carlis Abbott with vascular surgery.  He is continued on his doxycycline and rifampin.  His back pain continues to improve his thigh pain and groin pain have also dramatically improved.  He has seen Dr. Ellene Route for follow-up who obtained a chest x-ray that was concerning for a pleural effusion and referral made back to cardiothoracic surgery by Dr. Ellene Route.  Says that the surgical sites at times become more inflamed though there is no history of them becoming edematous or any evidence that he has had overt abscesses or clear-cut infection in the sites.      :   Past Medical History:  Diagnosis Date   Alcoholism ([place])    Anemia    Anxiety    Arthritis    knees, hands, lower back   Asthma    Blood transfusion without reported diagnosis    COPD (chronic obstructive pulmonary disease) ([place])    COVID-19 in immunocompromised patient ([place]) 02/21/2021   Depression    Diabetes ([place])    type 2   GERD (gastroesophageal reflux disease)    History of hiatal hernia    HTN (hypertension)    Mass of right chest wall 02/21/2021   MRSA bacteremia    MRSA infection 01/11/2020   Neuromuscular disorder ([place])    tremors   Osteomyelitis (HCC)    right forefoot   Pancreatitis    Pneumonia    Substance abuse ([place])  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   Wears glasses     Past Surgical History:  Procedure Laterality Date   Amputation Right    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   AMPUTATION Right 10/18/2019   Procedure: RIGHT SECOND TOE AMPUTATION;  Surgeon: Newt Minion, MD;  Location: [place];  Service: Orthopedics;  Laterality: Right;   AMPUTATION Right 11/08/2019   Procedure: RIGHT TRANSMETATARSAL AMPUTATION;  Surgeon: Newt Minion, MD;  Location: [place];  Service: Orthopedics;  Laterality: Right;   AMPUTATION Left 12/02/2020    Procedure: AMPUTATION 2ND TOE;  Surgeon: Elam Dutch, MD;  Location: [place];  Service: Vascular;  Laterality: Left;   AMPUTATION Left 02/24/2021   Procedure: PARTIAL LEFT THIRD TOE AMPUTATION;  Surgeon: [name], MD;  Location: [place];  Service: Vascular;  Laterality: Left;   APPLICATION OF WOUND VAC Right 01/10/2020   Procedure: APPLICATION OF WOUND VAC, right foot;  Surgeon: [name], MD;  Location: [place];  Service: Vascular;  Laterality: Right;   BILIARY DILATION  12/06/2020   Procedure: BILIARY DILATION;  Surgeon: Irving Copas., MD;  Location: [place];  Service: Gastroenterology;;   BIOPSY  12/06/2020   Procedure: BIOPSY;  Surgeon: Irving Copas., MD;  Location: [place];  Service: Gastroenterology;;   CHOLECYSTECTOMY  2006   with gallbladder and spleen   COLONOSCOPY  8-10 years ago    in [place] exam per pt   ENDOSCOPIC RETROGRADE CHOLANGIOPANCREATOGRAPHY (ERCP) WITH PROPOFOL N/A 12/06/2020   Procedure: ENDOSCOPIC RETROGRADE CHOLANGIOPANCREATOGRAPHY (ERCP) WITH PROPOFOL;  Surgeon: Irving Copas., MD;  Location: [place];  Service: Gastroenterology;  Laterality: N/A;   HERNIA REPAIR  2008, 2010   hiatal hernia and 1 additional   NASAL SEPTUM SURGERY     PANCREATIC PSEUDOCYST DRAINAGE     PANCREATIC STENT PLACEMENT  12/06/2020   Procedure: PANCREATIC STENT PLACEMENT;  Surgeon: Irving Copas., MD;  Location: [place];  Service: Gastroenterology;;   PLEURAL EFFUSION DRAINAGE Right 01/10/2021   Procedure: DRAINAGE OF PLEURAL EFFUSION;  Surgeon: Gaye Pollack, MD;  Location: [place];  Service: Thoracic;  Laterality: Right;   REMOVAL OF STONES  12/06/2020   Procedure: REMOVAL OF STONES;  Surgeon: Irving Copas., MD;  Location: [place];  Service: Gastroenterology;;   [name]  12/06/2020   Procedure: [name];  Surgeon: Irving Copas., MD;  Location: [place];  Service:  Gastroenterology;;   SPLENECTOMY  2006   STUMP REVISION Right 11/24/2019   Procedure: REVISION RIGHT TRANSMETATARSAL AMPUTATION;  Surgeon: Newt Minion, MD;  Location: [place];  Service: Orthopedics;  Laterality: Right;   TEE WITHOUT CARDIOVERSION N/A 12/04/2020   Procedure: TRANSESOPHAGEAL ECHOCARDIOGRAM (TEE);  Surgeon: Acie Fredrickson Wonda Cheng, MD;  Location: [facility] ENDOSCOPY;  Service: Cardiovascular;  Laterality: N/A;   THORACIC EXPOSURE Right 01/03/2021   Procedure: THORACIC EXPOSURE with removal of rib;  Surgeon: Gaye Pollack, MD;  Location: [place];  Service: Cardiothoracic;  Laterality: Right;   THORACOTOMY Right 01/10/2021   Procedure: REDO THORACOTOMY;  Surgeon: Gaye Pollack, MD;  Location: [facility] OR;  Service: Thoracic;  Laterality: Right;   [place] VERTEBRECTOMY Right 01/03/2021   Procedure: Decompression of  Thoracic nine and Thoracic ten via corpectomy reconstruction with titanium spacer and rib autogrtaft. Lateral plate fixation Thoracic eight - Thoracic eleven;  Surgeon: Kristeen Miss, MD;  Location: [place];  Service: Neurosurgery;  Laterality: Right;   TOTAL KNEE ARTHROPLASTY Right 1982, 1984   TRANSMETATARSAL AMPUTATION Left  09/06/2020   Procedure: AMPUTATION LEFT GREAT TOE;  Surgeon: Waynetta Sandy, MD;  Location: [place];  Service: Vascular;  Laterality: Left;   WOUND DEBRIDEMENT Right 01/10/2020   Procedure: incisional DEBRIDEMENT of RIGHT TRANSMETATARSAL WOUND;  Surgeon: [name], MD;  Location: [facility] OR;  Service: Vascular;  Laterality: Right;    Family History  Problem Relation Age of Onset   Diabetes Mother    Hypertension Mother    Hyperlipidemia Mother    Kidney disease Mother    Thyroid disease Mother    Breast cancer Mother        mets   Lung cancer Mother    Diabetes Father    Alcohol abuse Sister    Sickle cell trait Sister    Diabetes Brother    Asthma Brother    Lung cancer Maternal Grandmother    Kidney disease Maternal Grandmother    Liver  cancer Maternal Uncle        x 4-5   Kidney disease Maternal Uncle    Kidney disease Paternal Uncle    Colon cancer Neg Hx    Colon polyps Neg Hx    Esophageal cancer Neg Hx    Rectal cancer Neg Hx    Stomach cancer Neg Hx       Social History   Socioeconomic History   Marital status: Married    Spouse name: [name]   Number of children: 1   Years of education: Not on file   Highest education level: Not on file  Occupational History   Occupation: unemployed  Tobacco Use   Smoking status: Every Day    Packs/day: 0.50    Years: 38.00    Pack years: 19.00    Types: Cigarettes    Start date: 09/14/1982   Smokeless tobacco: Never  Vaping Use   Vaping Use: Never used  Substance and Sexual Activity   Alcohol use: Not Currently    Comment: 2-3 per day, none since 2019   Drug use: Not Currently   Sexual activity: Not on file  Other Topics Concern   Not on file  Social History Narrative   One child, 4 step-kids   Social Determinants of Health   Financial Resource Strain: Not on file  Food Insecurity: Not on file  Transportation Needs: Not on file  Physical Activity: Not on file  Stress: Not on file  Social Connections: Not on file    Allergies  Allergen Reactions   Eggs Or Egg-Derived Products Rash   Morphine And Related Other (See Comments)    Cant take because of pancreatitis   Cocoa Rash     Current Outpatient Medications:    acetaminophen (TYLENOL) 500 MG tablet, Take 2 tablets (1,000 mg total) by mouth every 6 (six) hours. (Patient taking differently: Take 1,000 mg by mouth every 6 (six) hours as needed for moderate pain.), Disp: 30 tablet, Rfl: 0   albuterol (VENTOLIN HFA) 108 (90 Base) MCG/ACT inhaler, INHALE 2 TO 4 PUFFS INTO THE LUNGS EVERY 4 HOURS AS NEEDED FOR WHEEZING OR COUGH. (Patient taking differently: Inhale 2-4 puffs into the lungs every 4 (four) hours as needed for wheezing or shortness of breath.), Disp: 18 g, Rfl: 11   ARIPiprazole (ABILIFY) 5 MG  tablet, TAKE 1 TABLET (5 MG TOTAL) BY MOUTH DAILY. (Patient taking differently: Take 5 mg by mouth every evening.), Disp: 90 tablet, Rfl: 2   atorvastatin (LIPITOR) 40 MG tablet, Take 1 tablet (40 mg total) by mouth daily.,  Disp: 90 tablet, Rfl: 3   baclofen (LIORESAL) 10 MG tablet, Take 1 tablet (10 mg total) by mouth 3 (three) times daily as needed for muscle spasms., Disp: 60 each, Rfl: 0   bismuth subsalicylate (PEPTO BISMOL) 262 MG/15ML suspension, Take 30 mLs by mouth every 6 (six) hours as needed for indigestion or diarrhea or loose stools., Disp: , Rfl:    cetirizine (ZYRTEC) 10 MG tablet, Take 1 tablet (10 mg total) by mouth daily. (Patient taking differently: Take 10 mg by mouth daily as needed for allergies.), Disp: 30 tablet, Rfl: 11   Continuous Blood Gluc Receiver (DEXCOM G6 RECEIVER) DEVI, 1 Device by Does not apply route 4 (four) times daily., Disp: 1 each, Rfl: 11   Continuous Blood Gluc Sensor (DEXCOM G6 SENSOR) MISC, USE AS DIRECTED 4 TIMES DAILY, Disp: 3 each, Rfl: 11   Continuous Blood Gluc Transmit (DEXCOM G6 TRANSMITTER) MISC, 4 (four) times daily. as directed, Disp: , Rfl:    Continuous Blood Gluc Transmit (DEXCOM G6 TRANSMITTER) MISC, USE AS DIRECTED FOUR TIMES DAILY, Disp: 1 each, Rfl: 11   cyclobenzaprine (FLEXERIL) 10 MG tablet, Take 1 tablet (10 mg total) by mouth 3 (three) times daily., Disp: 60 tablet, Rfl: 0   doxycycline (VIBRA-TABS) 100 MG tablet, Take 1 tablet (100 mg total) by mouth 2 (two) times daily., Disp: 60 tablet, Rfl: 11   DULoxetine (CYMBALTA) 60 MG capsule, TAKE 1 CAPSULE (60 MG TOTAL) BY MOUTH DAILY. (Patient taking differently: Take 60 mg by mouth daily.), Disp: 90 capsule, Rfl: 2   EASY TOUCH PEN NEEDLES 31G X 5 MM MISC, Inject 1 application as directed 3 (three) times daily., Disp: 100 each, Rfl: 11   empagliflozin (JARDIANCE) 25 MG TABS tablet, TAKE 1 TABLET BY MOUTH DAILY. (Patient taking differently: Take 25 mg by mouth daily.), Disp: 90 tablet, Rfl:  3   fluconazole (DIFLUCAN) 200 MG tablet, Take 2 tablets (400mg ) on day 1, then 1 tablet (200mg ) on day 2-14., Disp: 15 tablet, Rfl: 0   Fluticasone-Salmeterol (ADVAIR) 250-50 MCG/DOSE AEPB, INHALE 1 PUFF INTO THE LUNGS 2 TIMES DAILY. (Patient taking differently: Inhale 1 puff into the lungs in the morning and at bedtime.), Disp: 180 each, Rfl: 3   gabapentin (NEURONTIN) 300 MG capsule, Take 3 capsules (900 mg total) by mouth 3 (three) times daily., Disp: 270 capsule, Rfl: 2   glucose blood test strip, USE AS INSTRUCTED, Disp: 100 strip, Rfl: 12   Heparin Lock Flush (HEPARIN FLUSH) 10 UNIT/ML SOLN injection, Inject 100 Units into the vein 2 (two) times daily., Disp: , Rfl:    ibuprofen (ADVIL) 400 MG tablet, Take 1 tablet (400 mg total) by mouth every 8 (eight) hours as needed for mild pain., Disp: 60 tablet, Rfl: 0   insulin glargine (LANTUS) 100 UNIT/ML injection, Inject 10 Units into the skin at bedtime., Disp: , Rfl:    insulin lispro (HUMALOG) 100 UNIT/ML KwikPen, INJECT 6-10 UNITS INTO THE SKIN 3 (THREE) TIMES DAILY. (Patient taking differently: Inject 2-6 Units into the skin 3 (three) times daily. Sliding scale not provided), Disp: 15 mL, Rfl: 11   Lancets (FREESTYLE) lancets, Use as instructed, Disp: 100 each, Rfl: 12   lidocaine (LIDODERM) 5 %, PLACE 1 PATCH ONTO THE SKIN DAILY. REMOVE & DISCARD PATCH WITHIN 12 HOURS OR AS DIRECTED BY MD (Patient taking differently: 1 patch daily. Remove & discard patch within 12 hours or as directed by MD), Disp: 30 patch, Rfl: 0   lipase/protease/amylase (CREON) 36000 UNITS  CPEP capsule, Take 36,000 Units by mouth with breakfast, with lunch, and with evening meal., Disp: , Rfl:    metFORMIN (GLUCOPHAGE) 1000 MG tablet, TAKE 1 TABLET (1,000 MG TOTAL) BY MOUTH 2 (TWO) TIMES DAILY WITH A MEAL. (Patient taking differently: Take 1,000 mg by mouth 2 (two) times daily with a meal.), Disp: 180 tablet, Rfl: 3   metoprolol succinate (TOPROL-XL) 100 MG 24 hr tablet,  Take 1 tablet (100 mg total) by mouth at bedtime. Take with or immediately following a meal., Disp: 90 tablet, Rfl: 3   mirtazapine (REMERON) 30 MG tablet, TAKE 1 TABLET (30 MG TOTAL) BY MOUTH AT BEDTIME. (Patient taking differently: Take 30 mg by mouth at bedtime.), Disp: 90 tablet, Rfl: 3   montelukast (SINGULAIR) 10 MG tablet, TAKE 1 TABLET (10 MG TOTAL) BY MOUTH AT BEDTIME. (Patient taking differently: Take 10 mg by mouth in the morning.), Disp: 90 tablet, Rfl: 11   omeprazole (PRILOSEC) 40 MG capsule, Take 1 capsule (40 mg total) by mouth daily., Disp: 30 capsule, Rfl: 3   ondansetron (ZOFRAN ODT) 4 MG disintegrating tablet, Take 1 tablet (4 mg total) by mouth every 8 (eight) hours as needed for nausea or vomiting., Disp: 20 tablet, Rfl: 0   oxyCODONE-acetaminophen (PERCOCET) 10-325 MG tablet, Take 1 tablet by mouth every 8 (eight) hours as needed., Disp: 50 tablet, Rfl: 0   rifampin (RIFADIN) 300 MG capsule, Take 1 capsule (300 mg total) by mouth 2 (two) times daily., Disp: 60 capsule, Rfl: 11   senna-docusate (SENOKOT-S) 8.6-50 MG tablet, Take 1 tablet by mouth at bedtime., Disp: 30 tablet, Rfl: 0   sitaGLIPtin (JANUVIA) 100 MG tablet, TAKE 1 TABLET (100 MG TOTAL) BY MOUTH DAILY. (Patient taking differently: Take 100 mg by mouth daily.), Disp: 90 tablet, Rfl: 10   Review of Systems  Constitutional:  Negative for chills and fever.  HENT:  Negative for congestion and sore throat.   Eyes:  Negative for photophobia.  Respiratory: Negative.  Negative for cough, choking, chest tightness, shortness of breath and wheezing.   Cardiovascular:  Negative for chest pain, palpitations and leg swelling.  Gastrointestinal:  Negative for abdominal pain, blood in stool, constipation, diarrhea, nausea and vomiting.  Genitourinary:  Negative for dysuria, flank pain and hematuria.  Musculoskeletal:  Negative for back pain and myalgias.  Skin:  Negative for rash.  Neurological:  Negative for dizziness,  weakness and headaches.  Hematological:  Does not bruise/bleed easily.  Psychiatric/Behavioral:  Negative for behavioral problems, confusion, decreased concentration, dysphoric mood, self-injury, sleep disturbance and suicidal ideas. The patient is not nervous/anxious and is not hyperactive.       Objective:   Physical Exam Constitutional:      General: He is not in acute distress.    Appearance: Normal appearance. He is well-developed. He is not ill-appearing or diaphoretic.  HENT:     Head: Normocephalic and atraumatic.     Right Ear: Hearing and external ear normal.     Left Ear: Hearing and external ear normal.     Nose: Nose normal. No nasal deformity or rhinorrhea.     Mouth/Throat:     Pharynx: No oropharyngeal exudate.  Eyes:     General: No scleral icterus.       Right eye: No discharge.        Left eye: No discharge.     Extraocular Movements: Extraocular movements intact.     Conjunctiva/sclera: Conjunctivae normal.     Right eye: Right conjunctiva is  not injected.     Left eye: Left conjunctiva is not injected.     Pupils: Pupils are equal, round, and reactive to light.  Neck:     Vascular: No JVD.  Cardiovascular:     Rate and Rhythm: Normal rate and regular rhythm.     Heart sounds: Normal heart sounds, S1 normal and S2 normal. No murmur heard.   No friction rub. No gallop.  Pulmonary:     Effort: Pulmonary effort is normal. No respiratory distress.     Breath sounds: No stridor. Examination of the right-lower field reveals decreased breath sounds. Examination of the left-lower field reveals decreased breath sounds. Decreased breath sounds present. No wheezing, rhonchi or rales.  Chest:     Chest wall: No tenderness.  Abdominal:     General: Bowel sounds are normal. There is no distension.     Palpations: Abdomen is soft.     Tenderness: There is no abdominal tenderness. There is no rebound.  Musculoskeletal:        General: No tenderness. Normal range of  motion.     Right shoulder: Normal.     Left shoulder: Normal.     Cervical back: Normal range of motion and neck supple.     Right hip: Normal.     Left hip: Normal.     Right knee: Normal.     Left knee: Normal.  Lymphadenopathy:     Head:     Right side of head: No submandibular, preauricular or posterior auricular adenopathy.     Left side of head: No submandibular, preauricular or posterior auricular adenopathy.     Cervical: No cervical adenopathy.     Right cervical: No superficial or deep cervical adenopathy.    Left cervical: No superficial or deep cervical adenopathy.  Skin:    General: Skin is warm and dry.     Coloration: Skin is not jaundiced or pale.     Findings: No abrasion, bruising, ecchymosis, erythema, lesion or rash.     Nails: There is no clubbing.  Neurological:     General: No focal deficit present.     Mental Status: He is alert and oriented to person, place, and time.     Sensory: No sensory deficit.     Coordination: Coordination normal.     Gait: Gait normal.  Psychiatric:        Attention and Perception: He is attentive.        Mood and Affect: Mood normal.        Speech: Speech normal.        Behavior: Behavior normal. Behavior is cooperative.        Thought Content: Thought content normal.        Judgment: Judgment normal.     Left foot 02/21/2021:       03/28/2021       Site of chest tubes where he is experiencing swelling 02/21/2021:   03/28/2021:       Assessment & Plan:   50 y.o. 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   He ultimately went to the operating room on April 22  and underwent vertebrectomy, and decompression of his epidural abscess with hardware reconstruction joint neurosurgical and cardiothoracic surgical involvement.   He then had to go back to the operating room on the 29th for redo thoracotomy due to an infected empyema.  Chest tubes were placed.   Unfortunately his MRSA isolate from the operating room was now found to be daptomycin resistant though still sensitive to vancomycin he was switched over to vancomycin along with rifampin given the hardware.   Chest tubes were removed towards the end of his hospital stay.  He also previously been found to have osteomyelitis of the pubic symphysis.  I repeated an MRI of this area because he had some discomfort in his leg and sensation in the muscle.  MRI showed improvement of the myositis as well as the osteomyelitis at the site.  Now undergone resection of third toe.  He apparently has a pleural effusion that is present on chest x-ray and needs to be seen by cardiothoracic surgeon.  We will check labs today and continue his doxycycline and rifampin.  I spent more than 40 minutes with the patient including  face to face counseling of the patient personally reviewing radiographs, along with pertinent laboratory microbiological, data, review of medical records in preparation for the visit and during the visit and in coordination of her care.

## 2021-03-29 LAB — COMPLETE METABOLIC PANEL WITH GFR
AG Ratio: 1.4 (calc) (ref 1.0–2.5)
ALT: 22 U/L (ref 9–46)
AST: 16 U/L (ref 10–35)
Albumin: 4.2 g/dL (ref 3.6–5.1)
Alkaline phosphatase (APISO): 101 U/L (ref 35–144)
BUN: 12 mg/dL (ref 7–25)
CO2: 29 mmol/L (ref 20–32)
Calcium: 9.7 mg/dL (ref 8.6–10.3)
Chloride: 101 mmol/L (ref 98–110)
Creat: 0.89 mg/dL (ref 0.70–1.30)
Globulin: 3 g/dL (calc) (ref 1.9–3.7)
Glucose, Bld: 188 mg/dL — ABNORMAL HIGH (ref 65–99)
Potassium: 5.1 mmol/L (ref 3.5–5.3)
Sodium: 134 mmol/L — ABNORMAL LOW (ref 135–146)
Total Bilirubin: 0.3 mg/dL (ref 0.2–1.2)
Total Protein: 7.2 g/dL (ref 6.1–8.1)
eGFR: 103 mL/min/{1.73_m2} (ref >=60)

## 2021-03-29 LAB — CBC WITH DIFFERENTIAL/PLATELET
Absolute Monocytes: 618 cells/uL (ref 200–950)
Basophils Absolute: 30 cells/uL (ref 0–200)
Basophils Relative: 0.5 %
Eosinophils Absolute: 90 cells/uL (ref 15–500)
Eosinophils Relative: 1.5 %
HCT: 41.9 % (ref 38.5–50.0)
Hemoglobin: 13.4 g/dL (ref 13.2–17.1)
Lymphs Abs: 1878 cells/uL (ref 850–3900)
MCH: 28 pg (ref 27.0–33.0)
MCHC: 32 g/dL (ref 32.0–36.0)
MCV: 87.5 fL (ref 80.0–100.0)
MPV: 9.7 fL (ref 7.5–12.5)
Monocytes Relative: 10.3 %
Neutro Abs: 3384 cells/uL (ref 1500–7800)
Neutrophils Relative %: 56.4 %
Platelets: 394 10*3/uL (ref 140–400)
RBC: 4.79 10*6/uL (ref 4.20–5.80)
RDW: 14.4 % (ref 11.0–15.0)
Total Lymphocyte: 31.3 %
WBC: 6 10*3/uL (ref 3.8–10.8)

## 2021-03-29 LAB — C-REACTIVE PROTEIN: CRP: 2.2 mg/L (ref <8.0)

## 2021-03-29 LAB — SEDIMENTATION RATE: Sed Rate: 17 mm/h (ref 0–20)

## 2021-03-29 NOTE — Progress Notes (Signed)
    SUBJECTIVE:   CHIEF COMPLAINT / HPI:   Diabetic Follow Up: Patient is a 50 y.o. male who present today for diabetic follow up.   Patient endorses no problems  Home medications include: Lantus 10 units daily, metformin 1000 mg twice daily, Januvia 100 mg daily, Jardiance 25 mg daily Patient endorses taking these medications as prescribed.  Most recent A1Cs:  Lab Results  Component Value Date   HGBA1C 7.7 (A) 04/01/2021   HGBA1C 9.1 (H) 12/27/2020   HGBA1C 8.7 (A) 12/23/2020   Last Microalbumin, LDL, Creatinine: Lab Results  Component Value Date   LDLCALC 34 10/23/2020   CREATININE 0.89 03/28/2021   Patient does check blood glucose on a regular basis. Recently in the 150s-200 range, occasionally in the 250s. Has previously seen it in the 50s-80s several weeks ago but not in the last 4-6 week.  Patient is not up to date on diabetic eye. Patient is not up to date on diabetic foot exam.  Hyperlipidemia Currently on atorvastatin 40 mg daily. Last LDL earlier this year of 34, HDL also of 34. Currently at goal.   PERTINENT  PMH / PSH: Recent surgery for osteomyelitis  OBJECTIVE:   BP 122/85   Pulse 87   Wt 231 lb 3.2 oz (104.9 kg)   SpO2 98%   BMI 28.90 kg/m    Diabetic foot exam was performed.  Visual Findings: Previous amputation of multiple toes on left foot and all toes on the right foot which appears well-healing Posterior tibialis and dorsalis pulse intact bilaterally.  Sensation: Sensation reduced along the plantar aspect of the foot bilaterally    General: NAD, pleasant, able to participate in exam Cardiac: RRR, no murmurs. Respiratory: CTAB, normal effort Skin: warm and dry, no rashes noted Neuro: alert, no obvious focal deficits Psych: Normal affect and mood  ASSESSMENT/PLAN:   Type 2 diabetes mellitus with diabetic neuropathy, with long-term current use of insulin (HCC) A1c today of 7.7.  Continue current medications.  Should recheck in 3 months.   We will check urine microalbumin.  Recommended diabetic eye exam  Hyperlipidemia associated with type 2 diabetes mellitus ([place]) LDL at goal. Continue atorvastatin.  We will recheck lipid panel in 6 months  Screen for colon cancer: Recommend colonoscopy.  Spoke with the patient and he is formally spoken with  and due to his history of osteomyelitis and recent toe amputation they recommended holding off for the time being.  I think this is appropriate and told the patient that we should revisit this in a few months or early next year.  Lurline Del, [place]

## 2021-03-31 ENCOUNTER — Other Ambulatory Visit (HOSPITAL_COMMUNITY): Payer: Self-pay

## 2021-04-01 ENCOUNTER — Other Ambulatory Visit (HOSPITAL_COMMUNITY): Payer: Self-pay

## 2021-04-01 ENCOUNTER — Other Ambulatory Visit: Payer: Self-pay

## 2021-04-01 ENCOUNTER — Ambulatory Visit (INDEPENDENT_AMBULATORY_CARE_PROVIDER_SITE_OTHER): Payer: 59 | Admitting: Family Medicine

## 2021-04-01 ENCOUNTER — Encounter: Payer: Self-pay | Admitting: Family Medicine

## 2021-04-01 VITALS — BP 122/85 | HR 87 | Wt 231.2 lb

## 2021-04-01 DIAGNOSIS — E1169 Type 2 diabetes mellitus with other specified complication: Secondary | ICD-10-CM

## 2021-04-01 DIAGNOSIS — Z794 Long term (current) use of insulin: Secondary | ICD-10-CM

## 2021-04-01 DIAGNOSIS — E785 Hyperlipidemia, unspecified: Secondary | ICD-10-CM

## 2021-04-01 DIAGNOSIS — E1140 Type 2 diabetes mellitus with diabetic neuropathy, unspecified: Secondary | ICD-10-CM | POA: Diagnosis not present

## 2021-04-01 LAB — POCT GLYCOSYLATED HEMOGLOBIN (HGB A1C): HbA1c, POC (controlled diabetic range): 7.7 % — AB (ref 0.0–7.0)

## 2021-04-01 MED FILL — Aripiprazole Tab 5 MG: ORAL | 90 days supply | Qty: 90 | Fill #0 | Status: AC

## 2021-04-01 NOTE — Assessment & Plan Note (Signed)
LDL at goal. Continue atorvastatin.  We will recheck lipid panel in 6 months

## 2021-04-01 NOTE — Patient Instructions (Addendum)
Your A1c today improved to 7.7 from 9.1.  Congratulations on the great work!  We should continue the current medications.  If you are seeing the morning blood sugars in the 200-220 range I recommend considering increasing your long-acting insulin by 2 units and continuing to check your morning sugar levels.  If you see any levels below 90 in the morning I recommend we consider reducing your long-acting insulin by 2 units.  My current goal for you is morning sugars ranging from 150s to 185/190.  I do recommend that you have a diabetic eye exam over the next few months.  This is recommended to do each year.  We are also going to check your urine today for any protein which can be an early sign of kidney distress from diabetes.  I completed your paperwork today for your parking sticker.  We should recheck a cholesterol panel in about 6 months  I do recommend that you have a colonoscopy at some time.  Your other physicians may not want this to occur until your infection is more treated but it is something we could continue having a discussion about.

## 2021-04-01 NOTE — Assessment & Plan Note (Signed)
A1c today of 7.7.  Continue current medications.  Should recheck in 3 months.  We will check urine microalbumin.  Recommended diabetic eye exam

## 2021-04-02 LAB — MICROALBUMIN / CREATININE URINE RATIO
Creatinine, Urine: 59.3 mg/dL
Microalb/Creat Ratio: <5 mg/g creat (ref 0–29)
Microalbumin, Urine: <3 ug/mL

## 2021-04-08 ENCOUNTER — Ambulatory Visit (INDEPENDENT_AMBULATORY_CARE_PROVIDER_SITE_OTHER): Payer: 59 | Admitting: Physician Assistant

## 2021-04-08 ENCOUNTER — Other Ambulatory Visit: Payer: Self-pay

## 2021-04-08 VITALS — BP 126/88 | HR 89 | Temp 98.3°F | Resp 20 | Ht 75.0 in | Wt 233.6 lb

## 2021-04-08 DIAGNOSIS — M869 Osteomyelitis, unspecified: Secondary | ICD-10-CM

## 2021-04-08 DIAGNOSIS — Z89431 Acquired absence of right foot: Secondary | ICD-10-CM

## 2021-04-08 DIAGNOSIS — S98112A Complete traumatic amputation of left great toe, initial encounter: Secondary | ICD-10-CM

## 2021-04-08 NOTE — Progress Notes (Signed)
POST OPERATIVE OFFICE NOTE    CC:  F/u for surgery  HPI:  This is a 50 y.o. male who is s/p Partial left third toe amputation by Dr. Carlis Abbott on 02/24/21. 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 He presents today for incision check with no complaints. No pain, drainage, redness, fever or chills. He does have frequent times when he breaks out in sweats. Feels this may be related to his antibiotics he is on.  He has complicated history having undergone initially a right second toe amp converted to a right TMA by orthopedic surgery and then Dr. Carlis Abbott debrided his right TMA in 2021 and placed him on IV antibiotics and this eventually healed.  He then underwent a left great toe amp with Dr. Donzetta Matters in December 2021 and left second toe amp by Dr. Oneida Alar in March 2022.   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  Immediately following he had to have a redo thoracotomy due to empyema.  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 (Patient taking differently: Take 1,000 mg by mouth every 6 (six) hours as needed for moderate pain.) 30 tablet 0   albuterol (VENTOLIN HFA) 108 (90 Base) MCG/ACT inhaler INHALE 2 TO 4 PUFFS INTO THE LUNGS EVERY 4 HOURS AS NEEDED FOR WHEEZING OR COUGH. (Patient taking differently: Inhale 2-4 puffs into the lungs every 4 (four) hours as needed for wheezing or shortness of breath.) 18 g 11   ARIPiprazole (ABILIFY) 5 MG tablet TAKE 1 TABLET (5 MG TOTAL) BY MOUTH DAILY. (Patient taking  differently: Take 5 mg by mouth every evening.) 90 tablet 2   atorvastatin (LIPITOR) 40 MG tablet Take 1 tablet (40 mg total) by mouth daily. 90 tablet 3   bismuth subsalicylate (PEPTO BISMOL) 262 MG/15ML suspension Take 30 mLs by mouth every 6 (six) hours as needed for indigestion or diarrhea or loose stools.     cetirizine (ZYRTEC) 10 MG tablet Take 1 tablet (10 mg total) by mouth daily. (Patient taking differently: Take 10 mg by mouth daily as needed for allergies.) 30 tablet 11   Continuous Blood Gluc Receiver (DEXCOM G6 RECEIVER) DEVI 1 Device by Does not apply route 4 (four) times daily. 1 each 11   Continuous Blood Gluc Sensor (DEXCOM G6 SENSOR) MISC USE AS DIRECTED 4 TIMES DAILY 3 each 11   Continuous Blood Gluc Transmit (DEXCOM G6 TRANSMITTER) MISC 4 (four) times daily. as directed     Continuous Blood Gluc Transmit (DEXCOM G6 TRANSMITTER) MISC USE AS DIRECTED FOUR TIMES DAILY 1 each 11   cyclobenzaprine (FLEXERIL) 10 MG tablet Take 1 tablet (10 mg total) by mouth 3 (three) times daily. 60 tablet 0   doxycycline (VIBRA-TABS) 100 MG tablet Take 1 tablet (100 mg total) by mouth 2 (two) times daily. 60 tablet 11   DULoxetine (CYMBALTA) 60 MG capsule TAKE 1 CAPSULE (60 MG TOTAL) BY MOUTH DAILY. (Patient taking differently: Take 60 mg by mouth daily.) 90 capsule 2  EASY TOUCH PEN NEEDLES 31G X 5 MM MISC Inject 1 application as directed 3 (three) times daily. 100 each 11   empagliflozin (JARDIANCE) 25 MG TABS tablet TAKE 1 TABLET BY MOUTH DAILY. (Patient taking differently: Take 25 mg by mouth daily.) 90 tablet 3   Fluticasone-Salmeterol (ADVAIR) 250-50 MCG/DOSE AEPB INHALE 1 PUFF INTO THE LUNGS 2 TIMES DAILY. (Patient taking differently: Inhale 1 puff into the lungs in the morning and at bedtime.) 180 each 3   gabapentin (NEURONTIN) 300 MG capsule Take 3 capsules (900 mg total) by mouth 3 (three) times daily. 270 capsule 2   glucose blood test strip USE AS INSTRUCTED 100 strip 12   Heparin Lock  Flush (HEPARIN FLUSH) 10 UNIT/ML SOLN injection Inject 100 Units into the vein 2 (two) times daily.     ibuprofen (ADVIL) 400 MG tablet Take 1 tablet (400 mg total) by mouth every 8 (eight) hours as needed for mild pain. 60 tablet 0   insulin glargine (LANTUS) 100 UNIT/ML injection Inject 10 Units into the skin at bedtime.     insulin lispro (HUMALOG) 100 UNIT/ML KwikPen INJECT 6-10 UNITS INTO THE SKIN 3 (THREE) TIMES DAILY. (Patient taking differently: Inject 2-6 Units into the skin 3 (three) times daily. Sliding scale not provided) 15 mL 11   Lancets (FREESTYLE) lancets Use as instructed 100 each 12   lidocaine (LIDODERM) 5 % PLACE 1 PATCH ONTO THE SKIN DAILY. REMOVE & DISCARD PATCH WITHIN 12 HOURS OR AS DIRECTED BY MD (Patient taking differently: 1 patch daily. Remove & discard patch within 12 hours or as directed by MD) 30 patch 0   lipase/protease/amylase (CREON) 36000 UNITS CPEP capsule Take 36,000 Units by mouth with breakfast, with lunch, and with evening meal.     metFORMIN (GLUCOPHAGE) 1000 MG tablet TAKE 1 TABLET (1,000 MG TOTAL) BY MOUTH 2 (TWO) TIMES DAILY WITH A MEAL. (Patient taking differently: Take 1,000 mg by mouth 2 (two) times daily with a meal.) 180 tablet 3   metoprolol succinate (TOPROL-XL) 100 MG 24 hr tablet Take 1 tablet (100 mg total) by mouth at bedtime. Take with or immediately following a meal. 90 tablet 3   mirtazapine (REMERON) 30 MG tablet TAKE 1 TABLET (30 MG TOTAL) BY MOUTH AT BEDTIME. (Patient taking differently: Take 30 mg by mouth at bedtime.) 90 tablet 3   Molnupiravir 200 MG CAPS      montelukast (SINGULAIR) 10 MG tablet TAKE 1 TABLET (10 MG TOTAL) BY MOUTH AT BEDTIME. (Patient taking differently: Take 10 mg by mouth in the morning.) 90 tablet 11   omeprazole (PRILOSEC) 40 MG capsule Take 1 capsule (40 mg total) by mouth daily. 30 capsule 3   ondansetron (ZOFRAN ODT) 4 MG disintegrating tablet Take 1 tablet (4 mg total) by mouth every 8 (eight) hours as needed for  nausea or vomiting. 20 tablet 0   oxyCODONE-acetaminophen (PERCOCET) 10-325 MG tablet Take 1 tablet by mouth every 8 (eight) hours as needed. 50 tablet 0   rifampin (RIFADIN) 300 MG capsule Take 1 capsule (300 mg total) by mouth 2 (two) times daily. 60 capsule 11   sitaGLIPtin (JANUVIA) 100 MG tablet TAKE 1 TABLET (100 MG TOTAL) BY MOUTH DAILY. (Patient taking differently: Take 100 mg by mouth daily.) 90 tablet 10   No current facility-administered medications for this visit.     ROS:  See HPI  Physical Exam:  Vitals:   04/08/21 0852  BP: 126/88  Pulse: 89  Resp: 20  Temp:  98.3 F (36.8 C)  TempSrc: Temporal  SpO2: 98%  Weight: 233 lb 9.6 oz (106 kg)  Height: '6\' 3"'$  (1.905 m)   General: well appearing, well nourished Incision:  left 3rd toe amputation site is healing well. Nylon sutures removed  Extremities:  well perfused and warm with palpable DP Neuro: alert and oriented   Assessment/Plan:  This is a 50 y.o. male who is s/p Partial left third toe amputation by Dr. Carlis Abbott on 02/24/21. Left leg is well perfused. He has no new wounds, claudication or rest pain. Left 3rd toe is healing well. Sutures removed today.  He will continue follow up with ID  - Recommend he keep his feet protected -He will follow up as needed   Karoline Caldwell, PA-C [facility] [phone]  Clinic MD:  Carlis Abbott / Stanford Breed

## 2021-04-14 ENCOUNTER — Other Ambulatory Visit (HOSPITAL_COMMUNITY): Payer: Self-pay

## 2021-04-24 ENCOUNTER — Other Ambulatory Visit: Payer: Self-pay | Admitting: Family Medicine

## 2021-04-24 ENCOUNTER — Telehealth: Payer: Self-pay

## 2021-04-24 ENCOUNTER — Other Ambulatory Visit (HOSPITAL_COMMUNITY): Payer: Self-pay

## 2021-04-24 DIAGNOSIS — G252 Other specified forms of tremor: Secondary | ICD-10-CM

## 2021-04-24 MED ORDER — METOPROLOL SUCCINATE ER 100 MG PO TB24
150.0000 mg | ORAL_TABLET | Freq: Every day | ORAL | 3 refills | Status: DC
  Filled 2021-04-24: qty 120, 80d supply, fill #0
  Filled 2021-04-24: qty 135, 90d supply, fill #0
  Filled 2021-07-09: qty 120, 80d supply, fill #1

## 2021-04-24 MED FILL — Metformin HCl Tab 1000 MG: ORAL | 90 days supply | Qty: 180 | Fill #1 | Status: AC

## 2021-04-24 NOTE — Telephone Encounter (Signed)
Patient calls nurse line regarding metoprolol rx. Per patient, he has been taking 1.5 tablets of metoprolol since OV with Dr. Tarry Kos in June. Patient is requesting new rx with updated information, as pharmacy will not dispense additional medication until this has been updated.   Please see office visit note from 03/06/21.  Talbot Grumbling, RN

## 2021-05-02 ENCOUNTER — Other Ambulatory Visit: Payer: Self-pay | Admitting: Family Medicine

## 2021-05-02 ENCOUNTER — Other Ambulatory Visit (HOSPITAL_COMMUNITY): Payer: Self-pay

## 2021-05-02 MED ORDER — FLUTICASONE-SALMETEROL 250-50 MCG/ACT IN AEPB
1.0000 | INHALATION_SPRAY | Freq: Two times a day (BID) | RESPIRATORY_TRACT | 2 refills | Status: DC
  Filled 2021-05-02: qty 60, 30d supply, fill #0
  Filled 2021-07-31: qty 60, 30d supply, fill #1
  Filled 2021-09-22: qty 60, 30d supply, fill #2

## 2021-05-02 MED FILL — Albuterol Sulfate Inhal Aero 108 MCG/ACT (90MCG Base Equiv): RESPIRATORY_TRACT | 17 days supply | Qty: 18 | Fill #1 | Status: AC

## 2021-05-29 ENCOUNTER — Other Ambulatory Visit (HOSPITAL_COMMUNITY): Payer: Self-pay

## 2021-05-29 ENCOUNTER — Other Ambulatory Visit: Payer: Self-pay | Admitting: Family Medicine

## 2021-05-29 DIAGNOSIS — Z794 Long term (current) use of insulin: Secondary | ICD-10-CM

## 2021-05-29 DIAGNOSIS — E1165 Type 2 diabetes mellitus with hyperglycemia: Secondary | ICD-10-CM

## 2021-05-29 MED FILL — Aripiprazole Tab 5 MG: ORAL | 90 days supply | Qty: 90 | Fill #1 | Status: CN

## 2021-05-29 MED FILL — Mirtazapine Tab 30 MG: ORAL | 90 days supply | Qty: 90 | Fill #1 | Status: AC

## 2021-05-29 MED FILL — Duloxetine HCl Enteric Coated Pellets Cap 60 MG (Base Eq): ORAL | 90 days supply | Qty: 90 | Fill #0 | Status: AC

## 2021-05-30 ENCOUNTER — Encounter: Payer: Self-pay | Admitting: Infectious Disease

## 2021-05-30 ENCOUNTER — Other Ambulatory Visit: Payer: Self-pay

## 2021-05-30 ENCOUNTER — Other Ambulatory Visit (HOSPITAL_COMMUNITY): Payer: Self-pay

## 2021-05-30 ENCOUNTER — Ambulatory Visit (INDEPENDENT_AMBULATORY_CARE_PROVIDER_SITE_OTHER): Payer: 59 | Admitting: Infectious Disease

## 2021-05-30 ENCOUNTER — Other Ambulatory Visit: Payer: Self-pay | Admitting: Family Medicine

## 2021-05-30 VITALS — BP 133/87 | HR 85 | Temp 98.0°F | Wt 236.4 lb

## 2021-05-30 DIAGNOSIS — M869 Osteomyelitis, unspecified: Secondary | ICD-10-CM | POA: Diagnosis not present

## 2021-05-30 DIAGNOSIS — S98319A Complete traumatic amputation of unspecified midfoot, initial encounter: Secondary | ICD-10-CM | POA: Diagnosis not present

## 2021-05-30 DIAGNOSIS — D75839 Thrombocytosis, unspecified: Secondary | ICD-10-CM

## 2021-05-30 DIAGNOSIS — E1169 Type 2 diabetes mellitus with other specified complication: Secondary | ICD-10-CM

## 2021-05-30 DIAGNOSIS — E11628 Type 2 diabetes mellitus with other skin complications: Secondary | ICD-10-CM | POA: Diagnosis not present

## 2021-05-30 DIAGNOSIS — E1149 Type 2 diabetes mellitus with other diabetic neurological complication: Secondary | ICD-10-CM

## 2021-05-30 DIAGNOSIS — E1165 Type 2 diabetes mellitus with hyperglycemia: Secondary | ICD-10-CM

## 2021-05-30 DIAGNOSIS — M86151 Other acute osteomyelitis, right femur: Secondary | ICD-10-CM

## 2021-05-30 DIAGNOSIS — M60003 Infective myositis, unspecified right leg: Secondary | ICD-10-CM | POA: Diagnosis not present

## 2021-05-30 DIAGNOSIS — M4620 Osteomyelitis of vertebra, site unspecified: Secondary | ICD-10-CM

## 2021-05-30 DIAGNOSIS — L089 Local infection of the skin and subcutaneous tissue, unspecified: Secondary | ICD-10-CM

## 2021-05-30 DIAGNOSIS — Z794 Long term (current) use of insulin: Secondary | ICD-10-CM

## 2021-05-30 DIAGNOSIS — J90 Pleural effusion, not elsewhere classified: Secondary | ICD-10-CM

## 2021-05-30 DIAGNOSIS — Z89431 Acquired absence of right foot: Secondary | ICD-10-CM

## 2021-05-30 DIAGNOSIS — E785 Hyperlipidemia, unspecified: Secondary | ICD-10-CM

## 2021-05-30 DIAGNOSIS — A4902 Methicillin resistant Staphylococcus aureus infection, unspecified site: Secondary | ICD-10-CM | POA: Diagnosis not present

## 2021-05-30 DIAGNOSIS — Z9889 Other specified postprocedural states: Secondary | ICD-10-CM

## 2021-05-30 DIAGNOSIS — R7881 Bacteremia: Secondary | ICD-10-CM

## 2021-05-30 DIAGNOSIS — E1140 Type 2 diabetes mellitus with diabetic neuropathy, unspecified: Secondary | ICD-10-CM

## 2021-05-30 DIAGNOSIS — M4645 Discitis, unspecified, thoracolumbar region: Secondary | ICD-10-CM

## 2021-05-30 DIAGNOSIS — B9562 Methicillin resistant Staphylococcus aureus infection as the cause of diseases classified elsewhere: Secondary | ICD-10-CM

## 2021-05-30 MED ORDER — RIFAMPIN 300 MG PO CAPS
300.0000 mg | ORAL_CAPSULE | Freq: Two times a day (BID) | ORAL | 11 refills | Status: DC
  Filled 2021-05-30: qty 60, 30d supply, fill #0
  Filled 2021-07-09: qty 60, 30d supply, fill #1
  Filled 2021-08-25: qty 60, 30d supply, fill #2
  Filled 2021-10-01: qty 60, 30d supply, fill #3

## 2021-05-30 MED ORDER — DEXCOM G6 TRANSMITTER MISC
Freq: Four times a day (QID) | 11 refills | Status: AC
  Filled 2021-05-30: qty 1, 90d supply, fill #0
  Filled 2021-05-30: qty 1, 1d supply, fill #0
  Filled 2021-08-18: qty 1, 90d supply, fill #1
  Filled 2021-12-15: qty 1, 90d supply, fill #2

## 2021-05-30 MED ORDER — DOXYCYCLINE HYCLATE 100 MG PO TABS
100.0000 mg | ORAL_TABLET | Freq: Two times a day (BID) | ORAL | 11 refills | Status: DC
  Filled 2021-05-30: qty 60, 30d supply, fill #0
  Filled 2021-07-09: qty 60, 30d supply, fill #1
  Filled 2021-08-25: qty 60, 30d supply, fill #2
  Filled 2021-10-01: qty 60, 30d supply, fill #3

## 2021-05-30 NOTE — Progress Notes (Signed)
Subjective:  Chief complaint back pain at times still  Patient ID: Christopher Fernandez, male    DOB: 04-Feb-1971, 50 y.o.   MRN: [id number]  HPI  50 y.o. 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   He ultimately went to the operating room on April 22 and underwent vertebrectomy, and decompression of his epidural abscess with hardware reconstruction joint neurosurgical and cardiothoracic surgical involvement.   He then had to go back to the operating room on the 29th for redo thoracotomy due to an infected empyema.  Chest tubes were placed.   Unfortunately his MRSA isolate from the operating room was now found to be daptomycin resistant though still sensitive to vancomycin he was switched over to vancomycin along with rifampin given the hardware.   Chest tubes were removed towards the end of his hospital stay.  He now is actually complaining of some swelling and discomfort along the site where his chest tube was placed.  He has had deterioration of his third toe and going to undergo amputation with vascular surgery with Dr. Carlis Abbott on Monday.  We did obtain a repeat MRI of his pelvis due to the concerns he was still having pain in his thigh.  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  His wife had tested positive for COVID-19 and he himself again did. We prescribed him moluprinavir.  Said that he felt dramatically better after starting the moluprinavir and that he only had a  lingering cough for a little while afterwards.  In the interim he has had partial third toe amputation performed by Dr. Carlis Abbott with vascular surgery.  He is continued on his doxycycline and rifampin.  His back pain continuesd to improve his thigh pain and groin pain have also dramatically improved.  He has seen Dr. Ellene Route for follow-up who obtained a chest x-ray that was concerning for a pleural effusion and referral made back to cardiothoracic surgery by Dr. Ellene Route.  Returns to clinic for follow-up.  His inflammatory markers were quite encouraging.  Has been seen by vascular surgery as well and his amputation site is doing well.      :   Past Medical History:  Diagnosis Date   Alcoholism ([place])    Anemia    Anxiety    Arthritis    knees, hands, lower back   Asthma    Blood transfusion without reported diagnosis    COPD (chronic obstructive pulmonary disease) ([place])    COVID-19 in immunocompromised patient ([place]) 02/21/2021   Depression    Diabetes ([place])    type 2   GERD (gastroesophageal reflux disease)    History of hiatal hernia    HTN (hypertension)    Mass of right chest wall 02/21/2021   MRSA bacteremia    MRSA infection 01/11/2020   Neuromuscular disorder (HCC)    tremors   Osteomyelitis (HCC)    right forefoot   Pancreatitis    Pneumonia    Substance abuse ([place])    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   Wears glasses  Past Surgical History:  Procedure Laterality Date   Amputation Right    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   AMPUTATION Right 10/18/2019   Procedure: RIGHT SECOND TOE AMPUTATION;  Surgeon: Newt Minion, MD;  Location: [facility];  Service: Orthopedics;  Laterality: Right;   AMPUTATION Right 11/08/2019   Procedure: RIGHT TRANSMETATARSAL AMPUTATION;  Surgeon: Newt Minion, MD;  Location: [facility];  Service: Orthopedics;  Laterality: Right;   AMPUTATION Left 12/02/2020   Procedure: AMPUTATION 2ND TOE;  Surgeon: Elam Dutch, MD;  Location: [facility];   Service: Vascular;  Laterality: Left;   AMPUTATION Left 02/24/2021   Procedure: PARTIAL LEFT THIRD TOE AMPUTATION;  Surgeon: [name], MD;  Location: [facility];  Service: Vascular;  Laterality: Left;   APPLICATION OF WOUND VAC Right 01/10/2020   Procedure: APPLICATION OF WOUND VAC, right foot;  Surgeon: [name], MD;  Location: [facility];  Service: Vascular;  Laterality: Right;   BILIARY DILATION  12/06/2020   Procedure: BILIARY DILATION;  Surgeon: Irving Copas., MD;  Location: [place] [facility];  Service: Gastroenterology;;   BIOPSY  12/06/2020   Procedure: BIOPSY;  Surgeon: Irving Copas., MD;  Location: [facility];  Service: Gastroenterology;;   CHOLECYSTECTOMY  2006   with gallbladder and spleen   COLONOSCOPY  8-10 years ago    in [place] exam per pt   ENDOSCOPIC RETROGRADE CHOLANGIOPANCREATOGRAPHY (ERCP) WITH PROPOFOL N/A 12/06/2020   Procedure: ENDOSCOPIC RETROGRADE CHOLANGIOPANCREATOGRAPHY (ERCP) WITH PROPOFOL;  Surgeon: Irving Copas., MD;  Location: [facility];  Service: Gastroenterology;  Laterality: N/A;   HERNIA REPAIR  2008, 2010   hiatal hernia and 1 additional   NASAL SEPTUM SURGERY     PANCREATIC PSEUDOCYST DRAINAGE     PANCREATIC STENT PLACEMENT  12/06/2020   Procedure: PANCREATIC STENT PLACEMENT;  Surgeon: Irving Copas., MD;  Location: [facility];  Service: Gastroenterology;;   PLEURAL EFFUSION DRAINAGE Right 01/10/2021   Procedure: DRAINAGE OF PLEURAL EFFUSION;  Surgeon: Gaye Pollack, MD;  Location: [place] [facility];  Service: Thoracic;  Laterality: Right;   REMOVAL OF STONES  12/06/2020   Procedure: REMOVAL OF STONES;  Surgeon: Irving Copas., MD;  Location: [facility];  Service: Gastroenterology;;   [name]  12/06/2020   Procedure: [name];  Surgeon: Irving Copas., MD;  Location: [facility];  Service: Gastroenterology;;   SPLENECTOMY  2006   STUMP REVISION Right 11/24/2019   Procedure:  REVISION RIGHT TRANSMETATARSAL AMPUTATION;  Surgeon: Newt Minion, MD;  Location: [facility];  Service: Orthopedics;  Laterality: Right;   TEE WITHOUT CARDIOVERSION N/A 12/04/2020   Procedure: TRANSESOPHAGEAL ECHOCARDIOGRAM (TEE);  Surgeon: Acie Fredrickson Wonda Cheng, MD;  Location: [facility] ENDOSCOPY;  Service: Cardiovascular;  Laterality: N/A;   THORACIC EXPOSURE Right 01/03/2021   Procedure: THORACIC EXPOSURE with removal of rib;  Surgeon: Gaye Pollack, MD;  Location: [facility];  Service: Cardiothoracic;  Laterality: Right;   THORACOTOMY Right 01/10/2021   Procedure: REDO THORACOTOMY;  Surgeon: Gaye Pollack, MD;  Location: [facility] OR;  Service: Thoracic;  Laterality: Right;   [place] VERTEBRECTOMY Right 01/03/2021   Procedure: Decompression of  Thoracic nine and Thoracic ten via corpectomy reconstruction with titanium spacer and rib autogrtaft. Lateral plate fixation Thoracic eight - Thoracic eleven;  Surgeon: Kristeen Miss, MD;  Location: [facility];  Service: Neurosurgery;  Laterality: Right;   TOTAL KNEE ARTHROPLASTY Right 1982, 1984   TRANSMETATARSAL AMPUTATION Left 09/06/2020   Procedure: AMPUTATION LEFT GREAT TOE;  Surgeon: Waynetta Sandy, MD;  Location: [facility] OR;  Service: Vascular;  Laterality: Left;   WOUND DEBRIDEMENT Right 01/10/2020   Procedure: incisional DEBRIDEMENT of RIGHT TRANSMETATARSAL WOUND;  Surgeon: [name], MD;  Location: [facility] OR;  Service: Vascular;  Laterality: Right;    Family History  Problem Relation Age of Onset   Diabetes Mother    Hypertension Mother    Hyperlipidemia Mother    Kidney disease Mother    Thyroid disease Mother    Breast cancer Mother        mets   Lung cancer Mother    Diabetes Father    Alcohol abuse Sister    Sickle cell trait Sister    Diabetes Brother    Asthma Brother    Lung cancer Maternal Grandmother    Kidney disease Maternal Grandmother    Liver cancer Maternal Uncle        x 4-5   Kidney disease Maternal Uncle    Kidney disease  Paternal Uncle    Colon cancer Neg Hx    Colon polyps Neg Hx    Esophageal cancer Neg Hx    Rectal cancer Neg Hx    Stomach cancer Neg Hx       Social History   Socioeconomic History   Marital status: Married    Spouse name: [name]   Number of children: 1   Years of education: Not on file   Highest education level: Not on file  Occupational History   Occupation: unemployed  Tobacco Use   Smoking status: Every Day    Packs/day: 0.50    Years: 38.00    Pack years: 19.00    Types: Cigarettes    Start date: 09/14/1982   Smokeless tobacco: Never  Vaping Use   Vaping Use: Never used  Substance and Sexual Activity   Alcohol use: Not Currently    Comment: 2-3 per day, none since 2019   Drug use: Not Currently   Sexual activity: Not on file  Other Topics Concern   Not on file  Social History Narrative   One child, 4 step-kids   Social Determinants of Health   Financial Resource Strain: Not on file  Food Insecurity: Not on file  Transportation Needs: Not on file  Physical Activity: Not on file  Stress: Not on file  Social Connections: Not on file    Allergies  Allergen Reactions   Eggs Or Egg-Derived Products Rash   Morphine And Related Other (See Comments)    Cant take because of pancreatitis   Cocoa Rash     Current Outpatient Medications:    acetaminophen (TYLENOL) 500 MG tablet, Take 2 tablets (1,000 mg total) by mouth every 6 (six) hours. (Patient taking differently: Take 1,000 mg by mouth every 6 (six) hours as needed for moderate pain.), Disp: 30 tablet, Rfl: 0   albuterol (VENTOLIN HFA) 108 (90 Base) MCG/ACT inhaler, INHALE 2 TO 4 PUFFS INTO THE LUNGS EVERY 4 HOURS AS NEEDED FOR WHEEZING OR COUGH. (Patient taking differently: Inhale 2-4 puffs into the lungs every 4 (four) hours as needed for wheezing or shortness of breath.), Disp: 18 g, Rfl: 11   ARIPiprazole (ABILIFY) 5 MG tablet, TAKE 1 TABLET (5 MG TOTAL) BY MOUTH DAILY. (Patient taking differently: Take 5  mg by mouth every evening.), Disp: 90 tablet, Rfl: 2   atorvastatin (LIPITOR) 40 MG tablet, Take 1 tablet (40 mg total) by mouth daily., Disp: 90 tablet, Rfl: 3   bismuth subsalicylate (PEPTO BISMOL) 262 MG/15ML suspension, Take  30 mLs by mouth every 6 (six) hours as needed for indigestion or diarrhea or loose stools., Disp: , Rfl:    cetirizine (ZYRTEC) 10 MG tablet, Take 1 tablet (10 mg total) by mouth daily. (Patient taking differently: Take 10 mg by mouth daily as needed for allergies.), Disp: 30 tablet, Rfl: 11   Continuous Blood Gluc Receiver (DEXCOM G6 RECEIVER) DEVI, 1 Device by Does not apply route 4 (four) times daily., Disp: 1 each, Rfl: 11   Continuous Blood Gluc Sensor (DEXCOM G6 SENSOR) MISC, USE AS DIRECTED 4 TIMES DAILY, Disp: 3 each, Rfl: 11   Continuous Blood Gluc Transmit (DEXCOM G6 TRANSMITTER) MISC, 4 (four) times daily. as directed, Disp: , Rfl:    Continuous Blood Gluc Transmit (DEXCOM G6 TRANSMITTER) MISC, USE AS DIRECTED FOUR TIMES DAILY, Disp: 1 each, Rfl: 11   cyclobenzaprine (FLEXERIL) 10 MG tablet, Take 1 tablet (10 mg total) by mouth 3 (three) times daily., Disp: 60 tablet, Rfl: 0   DULoxetine (CYMBALTA) 60 MG capsule, TAKE 1 CAPSULE (60 MG TOTAL) BY MOUTH DAILY. (Patient taking differently: Take 60 mg by mouth daily.), Disp: 90 capsule, Rfl: 2   EASY TOUCH PEN NEEDLES 31G X 5 MM MISC, Inject 1 application as directed 3 (three) times daily., Disp: 100 each, Rfl: 11   empagliflozin (JARDIANCE) 25 MG TABS tablet, TAKE 1 TABLET BY MOUTH DAILY. (Patient taking differently: Take 25 mg by mouth daily.), Disp: 90 tablet, Rfl: 3   fluticasone-salmeterol (ADVAIR) 250-50 MCG/ACT AEPB, Inhale 1 puff into the lungs in the morning and at bedtime., Disp: 60 each, Rfl: 2   gabapentin (NEURONTIN) 300 MG capsule, Take 3 capsules (900 mg total) by mouth 3 (three) times daily., Disp: 270 capsule, Rfl: 2   Heparin Lock Flush (HEPARIN FLUSH) 10 UNIT/ML SOLN injection, Inject 100 Units into the  vein 2 (two) times daily., Disp: , Rfl:    ibuprofen (ADVIL) 400 MG tablet, Take 1 tablet (400 mg total) by mouth every 8 (eight) hours as needed for mild pain., Disp: 60 tablet, Rfl: 0   insulin glargine (LANTUS) 100 UNIT/ML injection, Inject 10 Units into the skin at bedtime., Disp: , Rfl:    insulin lispro (HUMALOG) 100 UNIT/ML KwikPen, INJECT 6-10 UNITS INTO THE SKIN 3 (THREE) TIMES DAILY. (Patient taking differently: Inject 2-6 Units into the skin 3 (three) times daily. Sliding scale not provided), Disp: 15 mL, Rfl: 11   Lancets (FREESTYLE) lancets, Use as instructed, Disp: 100 each, Rfl: 12   lidocaine (LIDODERM) 5 %, PLACE 1 PATCH ONTO THE SKIN DAILY. REMOVE & DISCARD PATCH WITHIN 12 HOURS OR AS DIRECTED BY MD, Disp: 30 patch, Rfl: 0   lipase/protease/amylase (CREON) 36000 UNITS CPEP capsule, Take 36,000 Units by mouth with breakfast, with lunch, and with evening meal., Disp: , Rfl:    metFORMIN (GLUCOPHAGE) 1000 MG tablet, TAKE 1 TABLET (1,000 MG TOTAL) BY MOUTH 2 (TWO) TIMES DAILY WITH A MEAL. (Patient taking differently: Take 1,000 mg by mouth 2 (two) times daily with a meal.), Disp: 180 tablet, Rfl: 3   metoprolol succinate (TOPROL-XL) 100 MG 24 hr tablet, Take 1.5 tablets (150 mg total) by mouth at bedtime. Take with or immediately following a meal., Disp: 120 tablet, Rfl: 3   mirtazapine (REMERON) 30 MG tablet, TAKE 1 TABLET (30 MG TOTAL) BY MOUTH AT BEDTIME. (Patient taking differently: Take 30 mg by mouth at bedtime.), Disp: 90 tablet, Rfl: 3   montelukast (SINGULAIR) 10 MG tablet, TAKE 1 TABLET (10 MG TOTAL)  BY MOUTH AT BEDTIME. (Patient taking differently: Take 10 mg by mouth in the morning.), Disp: 90 tablet, Rfl: 11   omeprazole (PRILOSEC) 40 MG capsule, Take 1 capsule (40 mg total) by mouth daily., Disp: 30 capsule, Rfl: 3   ondansetron (ZOFRAN ODT) 4 MG disintegrating tablet, Take 1 tablet (4 mg total) by mouth every 8 (eight) hours as needed for nausea or vomiting., Disp: 20 tablet,  Rfl: 0   oxyCODONE-acetaminophen (PERCOCET) 10-325 MG tablet, Take 1 tablet by mouth every 8 (eight) hours as needed., Disp: 50 tablet, Rfl: 0   sitaGLIPtin (JANUVIA) 100 MG tablet, TAKE 1 TABLET (100 MG TOTAL) BY MOUTH DAILY. (Patient taking differently: Take 100 mg by mouth daily.), Disp: 90 tablet, Rfl: 10   doxycycline (VIBRA-TABS) 100 MG tablet, Take 1 tablet (100 mg total) by mouth 2 (two) times daily for 14 days., Disp: 28 tablet, Rfl: 0   doxycycline (VIBRA-TABS) 100 MG tablet, Take 1 tablet (100 mg total) by mouth 2 (two) times daily., Disp: 60 tablet, Rfl: 11   rifampin (RIFADIN) 300 MG capsule, Take 1 capsule (300 mg total) by mouth 2 (two) times daily., Disp: 60 capsule, Rfl: 11   Review of Systems  Constitutional:  Negative for activity change, appetite change, chills, diaphoresis, fatigue, fever and unexpected weight change.  HENT:  Negative for congestion, rhinorrhea, sinus pressure, sneezing, sore throat and trouble swallowing.   Eyes:  Negative for photophobia and visual disturbance.  Respiratory:  Negative for cough, chest tightness, shortness of breath, wheezing and stridor.   Cardiovascular:  Negative for chest pain, palpitations and leg swelling.  Gastrointestinal:  Negative for abdominal distention, abdominal pain, anal bleeding, blood in stool, constipation, diarrhea, nausea and vomiting.  Genitourinary:  Negative for difficulty urinating, dysuria, flank pain and hematuria.  Musculoskeletal:  Positive for back pain. Negative for arthralgias, gait problem, joint swelling and myalgias.  Skin:  Negative for color change, pallor, rash and wound.  Neurological:  Negative for dizziness, tremors, weakness and light-headedness.  Hematological:  Negative for adenopathy. Does not bruise/bleed easily.  Psychiatric/Behavioral:  Negative for agitation, behavioral problems, confusion, decreased concentration, dysphoric mood and sleep disturbance.       Objective:   Physical  Exam Constitutional:      General: He is not in acute distress.    Appearance: Normal appearance. He is well-developed. He is not ill-appearing or diaphoretic.  HENT:     Head: Normocephalic and atraumatic.     Right Ear: Hearing and external ear normal.     Left Ear: Hearing and external ear normal.     Nose: No nasal deformity or rhinorrhea.  Eyes:     General: No scleral icterus.    Conjunctiva/sclera: Conjunctivae normal.     Right eye: Right conjunctiva is not injected.     Left eye: Left conjunctiva is not injected.     Pupils: Pupils are equal, round, and reactive to light.  Neck:     Vascular: No JVD.  Cardiovascular:     Rate and Rhythm: Normal rate and regular rhythm.     Heart sounds: Normal heart sounds, S1 normal and S2 normal. No murmur heard.   No friction rub.  Abdominal:     General: Bowel sounds are normal. There is no distension.     Palpations: Abdomen is soft.     Tenderness: There is no abdominal tenderness.  Musculoskeletal:        General: Normal range of motion.     Right shoulder:  Normal.     Left shoulder: Normal.     Cervical back: Normal range of motion and neck supple.     Right hip: Normal.     Left hip: Normal.     Right knee: Normal.     Left knee: Normal.  Lymphadenopathy:     Head:     Right side of head: No submandibular, preauricular or posterior auricular adenopathy.     Left side of head: No submandibular, preauricular or posterior auricular adenopathy.     Cervical: No cervical adenopathy.     Right cervical: No superficial or deep cervical adenopathy.    Left cervical: No superficial or deep cervical adenopathy.  Skin:    General: Skin is warm and dry.     Coloration: Skin is not pale.     Findings: No abrasion, bruising, ecchymosis, erythema, lesion or rash.     Nails: There is no clubbing.  Neurological:     Mental Status: He is alert and oriented to person, place, and time.     Sensory: No sensory deficit.     Coordination:  Coordination normal.     Gait: Gait normal.  Psychiatric:        Attention and Perception: He is attentive.        Mood and Affect: Mood normal.        Speech: Speech normal.        Behavior: Behavior normal. Behavior is cooperative.        Thought Content: Thought content normal.        Judgment: Judgment normal.     Left foot 02/21/2021:       03/28/2021       May 30, 2021:        Assessment & Plan:   50 y.o. 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   He ultimately went to the operating room on April 22 and underwent vertebrectomy, and decompression of his epidural abscess with hardware reconstruction joint neurosurgical and cardiothoracic surgical involvement.   He then had to go back to the operating room on the 29th for redo thoracotomy due to an infected empyema.  Chest tubes were placed.   Unfortunately his MRSA isolate from the operating room was now found to be daptomycin resistant though still sensitive to vancomycin he was switched over to vancomycin along with rifampin given the hardware.   Chest tubes were removed towards the end of his hospital stay.  He also previously been found to have osteomyelitis of the pubic symphysis.  I repeated an MRI of this area because he had some discomfort in his leg and sensation in the muscle.  MRI showed improvement of the myositis as well as the osteomyelitis at the site.  Now undergone resection of third toe  I am rechecking sed rate C-reactive protein.  Will continue doxycycline twice  daily along with rifampin.  He had some concerns about drug interactions with rifampin and his other medications for lipids and his inhaled steroid.  I could not see the any problems looking through epic but he will give [place] a message back through MyChart  Hyperlipdemia: He is  continuing on atorvastatin  Diabetes mellitus mA1c much improved when reviewed on 04/01/2021

## 2021-05-31 LAB — COMPLETE METABOLIC PANEL WITH GFR
AG Ratio: 1.7 (calc) (ref 1.0–2.5)
ALT: 15 U/L (ref 9–46)
AST: 17 U/L (ref 10–35)
Albumin: 4.4 g/dL (ref 3.6–5.1)
Alkaline phosphatase (APISO): 79 U/L (ref 35–144)
BUN: 10 mg/dL (ref 7–25)
CO2: 26 mmol/L (ref 20–32)
Calcium: 9.3 mg/dL (ref 8.6–10.3)
Chloride: 99 mmol/L (ref 98–110)
Creat: 0.87 mg/dL (ref 0.70–1.30)
Globulin: 2.6 g/dL (calc) (ref 1.9–3.7)
Glucose, Bld: 146 mg/dL — ABNORMAL HIGH (ref 65–99)
Potassium: 4.5 mmol/L (ref 3.5–5.3)
Sodium: 135 mmol/L (ref 135–146)
Total Bilirubin: 0.5 mg/dL (ref 0.2–1.2)
Total Protein: 7 g/dL (ref 6.1–8.1)
eGFR: 104 mL/min/{1.73_m2} (ref >=60)

## 2021-05-31 LAB — CBC WITH DIFFERENTIAL/PLATELET
Absolute Monocytes: 430 cells/uL (ref 200–950)
Basophils Absolute: 30 cells/uL (ref 0–200)
Basophils Relative: 0.6 %
Eosinophils Absolute: 20 cells/uL (ref 15–500)
Eosinophils Relative: 0.4 %
HCT: 42.8 % (ref 38.5–50.0)
Hemoglobin: 13.7 g/dL (ref 13.2–17.1)
Lymphs Abs: 1465 cells/uL (ref 850–3900)
MCH: 28.1 pg (ref 27.0–33.0)
MCHC: 32 g/dL (ref 32.0–36.0)
MCV: 87.9 fL (ref 80.0–100.0)
MPV: 10.2 fL (ref 7.5–12.5)
Monocytes Relative: 8.6 %
Neutro Abs: 3055 cells/uL (ref 1500–7800)
Neutrophils Relative %: 61.1 %
Platelets: 301 10*3/uL (ref 140–400)
RBC: 4.87 10*6/uL (ref 4.20–5.80)
RDW: 13.7 % (ref 11.0–15.0)
Total Lymphocyte: 29.3 %
WBC: 5 10*3/uL (ref 3.8–10.8)

## 2021-05-31 LAB — SEDIMENTATION RATE: Sed Rate: 2 mm/h (ref 0–20)

## 2021-05-31 LAB — C-REACTIVE PROTEIN: CRP: 3 mg/L (ref <8.0)

## 2021-06-02 ENCOUNTER — Other Ambulatory Visit (HOSPITAL_COMMUNITY): Payer: Self-pay

## 2021-06-02 MED ORDER — DEXCOM G6 SENSOR MISC
Freq: Four times a day (QID) | 11 refills | Status: AC
  Filled 2021-06-02: qty 3, 30d supply, fill #0
  Filled 2021-06-12: qty 9, 90d supply, fill #0
  Filled 2021-09-12: qty 9, 90d supply, fill #1
  Filled 2021-12-15: qty 9, 90d supply, fill #2

## 2021-06-10 ENCOUNTER — Other Ambulatory Visit (HOSPITAL_COMMUNITY): Payer: Self-pay

## 2021-06-11 ENCOUNTER — Ambulatory Visit: Payer: 59 | Admitting: Family Medicine

## 2021-06-12 ENCOUNTER — Other Ambulatory Visit (HOSPITAL_COMMUNITY): Payer: Self-pay

## 2021-06-12 MED FILL — Albuterol Sulfate Inhal Aero 108 MCG/ACT (90MCG Base Equiv): RESPIRATORY_TRACT | 17 days supply | Qty: 18 | Fill #2 | Status: AC

## 2021-06-12 MED FILL — Aripiprazole Tab 5 MG: ORAL | 90 days supply | Qty: 90 | Fill #1 | Status: AC

## 2021-06-12 MED FILL — Empagliflozin Tab 25 MG: ORAL | 90 days supply | Qty: 90 | Fill #1 | Status: AC

## 2021-06-12 MED FILL — Sitagliptin Phosphate Tab 100 MG (Base Equiv): ORAL | 90 days supply | Qty: 90 | Fill #1 | Status: AC

## 2021-06-16 ENCOUNTER — Other Ambulatory Visit (HOSPITAL_COMMUNITY): Payer: Self-pay

## 2021-06-24 NOTE — Progress Notes (Signed)
    SUBJECTIVE:   CHIEF COMPLAINT / HPI:   Diabetic Follow Up: Patient is a 50 y.o. male who present today for diabetic follow up.   Patient endorses no problems  Home medications include: Jardiance 25 mg daily, 10 units Lantus at bedtime, 2 units 3 times daily of Humalog fast acting insulin, metformin 1000 mg twice daily, Januvia 100 mg daily Patient endorses taking these medications as prescribed.  Most recent A1Cs:  Lab Results  Component Value Date   HGBA1C 7.8 (A) 06/27/2021   HGBA1C 7.7 (A) 04/01/2021   HGBA1C 9.1 (H) 12/27/2020   Last Microalbumin, LDL, Creatinine: Lab Results  Component Value Date   LDLCALC 34 10/23/2020   CREATININE 0.87 05/30/2021   Patient does check blood glucose on a regular basis. He has been seeing numbers around 120 in the AM. Lowest in the 80s. He usually sees numbers in the 130-140 range after meals.  Patient is not up to date on diabetic eye. Patient is up to date on diabetic foot exam.  Weight loss Previously in the 240s but had gotten to the 220s. He has been able to increase it by eating a bit more. He was 260 before his osteomyelitis started. No blood in stool or black stools. Current smoker. Has never had a colonoscopy.   PERTINENT  PMH / PSH: Osteomyelitis of right pelvic region and thigh currently treated by infectious disease  OBJECTIVE:   BP 130/70   Pulse 78   Ht 6\' 3"  (1.905 m)   Wt 239 lb 4 oz (108.5 kg)   SpO2 100%   BMI 29.90 kg/m    General: NAD, pleasant, able to participate in exam Cardiac: RRR, no murmurs. Respiratory: CTAB, normal effort, No wheezes, rales or rhonchi Abdomen: Bowel sounds present, nontender, nondistended, no hepatosplenomegaly. Skin: warm and dry, no rashes noted Neuro: alert, no obvious focal deficits Psych: Normal affect and mood  ASSESSMENT/PLAN:   Type 2 diabetes mellitus with diabetic neuropathy, with long-term current use of insulin (HCC) A1c today of 7.8, previously 7.7.  Home  medications include Jardiance 25 mg daily, 10 units Lantus at bedtime, 2 units 3 times daily of Humalog fast acting insulin, metformin 1000 mg twice daily, Januvia 100 mg daily.  Patient is going to check his sugars and focus on diet and exercise and follow-up in about 1 to 2 months to see if we can make any changes of his long-acting or short-acting insulin.  Currently his fasting blood sugar is appropriate with 80s to 120s.  Consider starting losartan for renal protection   Weight loss: Patient previously in the 260s but since getting osteomyelitis a slow drop down her weight with the lowest being in the upper 220s, since his hospitalization is slowly gained weight over the past 3 documented times with weight at 239 today.  He has some concerns about his weight loss but has been able to gain when attempting to do so.  He is not up-to-date on his colonoscopy.  He is a current smoker but is not currently indicated for low-dose CT screening.  Most recent blood work with his infectious disease physician was all within normal limits I do not think we need to repeat this.  We will refer for colonoscopy.  Recommend following up in 2 months to evaluate his weight.  Lurline Del, [place]

## 2021-06-27 ENCOUNTER — Ambulatory Visit: Payer: 59 | Admitting: Family Medicine

## 2021-06-27 ENCOUNTER — Other Ambulatory Visit (HOSPITAL_COMMUNITY): Payer: Self-pay

## 2021-06-27 ENCOUNTER — Other Ambulatory Visit: Payer: Self-pay | Admitting: Family Medicine

## 2021-06-27 ENCOUNTER — Other Ambulatory Visit: Payer: Self-pay

## 2021-06-27 VITALS — BP 130/70 | HR 78 | Ht 75.0 in | Wt 239.2 lb

## 2021-06-27 DIAGNOSIS — Z23 Encounter for immunization: Secondary | ICD-10-CM | POA: Diagnosis not present

## 2021-06-27 DIAGNOSIS — E1140 Type 2 diabetes mellitus with diabetic neuropathy, unspecified: Secondary | ICD-10-CM

## 2021-06-27 DIAGNOSIS — Z1211 Encounter for screening for malignant neoplasm of colon: Secondary | ICD-10-CM | POA: Diagnosis not present

## 2021-06-27 DIAGNOSIS — Z794 Long term (current) use of insulin: Secondary | ICD-10-CM

## 2021-06-27 DIAGNOSIS — K219 Gastro-esophageal reflux disease without esophagitis: Secondary | ICD-10-CM

## 2021-06-27 LAB — POCT GLYCOSYLATED HEMOGLOBIN (HGB A1C): HbA1c, POC (controlled diabetic range): 7.8 % — AB (ref 0.0–7.0)

## 2021-06-27 MED ORDER — OMEPRAZOLE 40 MG PO CPDR
40.0000 mg | DELAYED_RELEASE_CAPSULE | Freq: Every day | ORAL | 3 refills | Status: DC
  Filled 2021-06-27: qty 30, 30d supply, fill #0
  Filled 2021-07-31: qty 30, 30d supply, fill #1
  Filled 2021-08-25: qty 30, 30d supply, fill #2
  Filled 2021-09-22: qty 30, 30d supply, fill #3

## 2021-06-27 MED ORDER — CYCLOBENZAPRINE HCL 10 MG PO TABS
10.0000 mg | ORAL_TABLET | Freq: Three times a day (TID) | ORAL | 0 refills | Status: DC
  Filled 2021-06-27: qty 60, 20d supply, fill #0

## 2021-06-27 NOTE — Assessment & Plan Note (Signed)
A1c today of 7.8, previously 7.7.  Home medications include Jardiance 25 mg daily, 10 units Lantus at bedtime, 2 units 3 times daily of Humalog fast acting insulin, metformin 1000 mg twice daily, Januvia 100 mg daily.  Patient is going to check his sugars and focus on diet and exercise and follow-up in about 1 to 2 months to see if we can make any changes of his long-acting or short-acting insulin.  Currently his fasting blood sugar is appropriate with 80s to 120s.  Consider starting losartan for renal protection

## 2021-06-27 NOTE — Patient Instructions (Addendum)
Your A1c today is 7.8, it was previously 7.7.  Our goal is to get less than 7.0.  I want you to keep a close watch on your morning glucose level as well as your glucose level after meals.  We can consider adjusting your insulin if either of these seem to be high.  I would like to see back in a month or 2 with a list of your recent sugars to see if we can make adjustments of her insulin.  Otherwise we will follow-up in 3 months for A1c recheck  I have sent in a referral for colonoscopy.  Your most recent blood work all looks good and I do not believe we need to repeat any of this today.  I have sent in refills for your omeprazole and Flexeril.

## 2021-07-02 LAB — HM DIABETES EYE EXAM

## 2021-07-09 ENCOUNTER — Other Ambulatory Visit: Payer: Self-pay | Admitting: Family Medicine

## 2021-07-09 ENCOUNTER — Other Ambulatory Visit (HOSPITAL_COMMUNITY): Payer: Self-pay

## 2021-07-09 ENCOUNTER — Encounter: Payer: Self-pay | Admitting: Family Medicine

## 2021-07-09 DIAGNOSIS — Z794 Long term (current) use of insulin: Secondary | ICD-10-CM

## 2021-07-09 MED FILL — Montelukast Sodium Tab 10 MG (Base Equiv): ORAL | 90 days supply | Qty: 90 | Fill #1 | Status: AC

## 2021-07-10 ENCOUNTER — Other Ambulatory Visit (HOSPITAL_COMMUNITY): Payer: Self-pay

## 2021-07-10 ENCOUNTER — Other Ambulatory Visit: Payer: Self-pay | Admitting: Family Medicine

## 2021-07-10 ENCOUNTER — Encounter: Payer: Self-pay | Admitting: Family Medicine

## 2021-07-10 DIAGNOSIS — Z794 Long term (current) use of insulin: Secondary | ICD-10-CM

## 2021-07-10 MED ORDER — INSULIN LISPRO (1 UNIT DIAL) 100 UNIT/ML (KWIKPEN)
6.0000 [IU] | PEN_INJECTOR | Freq: Three times a day (TID) | SUBCUTANEOUS | 11 refills | Status: DC
  Filled 2021-07-10: qty 15, 50d supply, fill #0
  Filled 2021-12-15: qty 15, 50d supply, fill #1

## 2021-07-10 MED ORDER — INSULIN GLARGINE-YFGN 100 UNIT/ML ~~LOC~~ SOPN
10.0000 [IU] | PEN_INJECTOR | Freq: Every day | SUBCUTANEOUS | 1 refills | Status: DC
Start: 2021-07-10 — End: 2022-01-12
  Filled 2021-07-10: qty 9, 90d supply, fill #0
  Filled 2021-12-15: qty 9, 90d supply, fill #1

## 2021-07-31 ENCOUNTER — Other Ambulatory Visit (HOSPITAL_COMMUNITY): Payer: Self-pay

## 2021-07-31 MED FILL — Albuterol Sulfate Inhal Aero 108 MCG/ACT (90MCG Base Equiv): RESPIRATORY_TRACT | 17 days supply | Qty: 18 | Fill #3 | Status: AC

## 2021-08-12 ENCOUNTER — Encounter: Payer: Self-pay | Admitting: Family Medicine

## 2021-08-12 ENCOUNTER — Other Ambulatory Visit: Payer: Self-pay | Admitting: Family Medicine

## 2021-08-12 ENCOUNTER — Other Ambulatory Visit (HOSPITAL_COMMUNITY): Payer: Self-pay

## 2021-08-12 DIAGNOSIS — E1142 Type 2 diabetes mellitus with diabetic polyneuropathy: Secondary | ICD-10-CM

## 2021-08-12 MED ORDER — GABAPENTIN 300 MG PO CAPS
900.0000 mg | ORAL_CAPSULE | Freq: Three times a day (TID) | ORAL | 2 refills | Status: DC
  Filled 2021-08-12: qty 270, 30d supply, fill #0
  Filled 2021-09-09: qty 270, 30d supply, fill #1
  Filled 2021-10-09: qty 270, 30d supply, fill #2

## 2021-08-12 NOTE — Telephone Encounter (Signed)
Medication routed in separate encounter.   Talbot Grumbling, RN

## 2021-08-14 ENCOUNTER — Encounter: Payer: Self-pay | Admitting: Gastroenterology

## 2021-08-18 ENCOUNTER — Other Ambulatory Visit: Payer: Self-pay | Admitting: Family Medicine

## 2021-08-18 ENCOUNTER — Encounter: Payer: Self-pay | Admitting: Family Medicine

## 2021-08-18 ENCOUNTER — Other Ambulatory Visit (HOSPITAL_COMMUNITY): Payer: Self-pay

## 2021-08-18 DIAGNOSIS — F418 Other specified anxiety disorders: Secondary | ICD-10-CM

## 2021-08-18 MED ORDER — ARIPIPRAZOLE 5 MG PO TABS
5.0000 mg | ORAL_TABLET | Freq: Every day | ORAL | 2 refills | Status: DC
  Filled 2021-08-18: qty 90, 90d supply, fill #0
  Filled 2021-11-18: qty 90, 90d supply, fill #1

## 2021-08-18 MED ORDER — METFORMIN HCL 1000 MG PO TABS
1000.0000 mg | ORAL_TABLET | Freq: Two times a day (BID) | ORAL | 3 refills | Status: DC
  Filled 2021-08-18: qty 180, 90d supply, fill #0
  Filled 2021-11-18: qty 180, 90d supply, fill #1

## 2021-08-18 MED FILL — Duloxetine HCl Enteric Coated Pellets Cap 60 MG (Base Eq): ORAL | 90 days supply | Qty: 90 | Fill #1 | Status: AC

## 2021-08-18 MED FILL — Mirtazapine Tab 30 MG: ORAL | 90 days supply | Qty: 90 | Fill #2 | Status: AC

## 2021-08-20 ENCOUNTER — Other Ambulatory Visit (HOSPITAL_COMMUNITY): Payer: Self-pay

## 2021-08-21 ENCOUNTER — Encounter: Payer: Self-pay | Admitting: Family Medicine

## 2021-08-21 ENCOUNTER — Encounter: Payer: Self-pay | Admitting: Infectious Disease

## 2021-08-21 NOTE — Telephone Encounter (Signed)
Spoke with patient, he is concerned that he is experiencing similar symptoms from his first infection. Concerned for possible reinfection. Declines fever/chills. Accepts appointment for 12/9 at 9 am.   Beryle Flock, RN

## 2021-08-22 ENCOUNTER — Other Ambulatory Visit (HOSPITAL_COMMUNITY): Payer: Self-pay

## 2021-08-22 ENCOUNTER — Encounter: Payer: Self-pay | Admitting: Infectious Disease

## 2021-08-22 ENCOUNTER — Other Ambulatory Visit: Payer: Self-pay

## 2021-08-22 ENCOUNTER — Ambulatory Visit: Payer: 59 | Admitting: Infectious Disease

## 2021-08-22 VITALS — HR 76 | Temp 98.3°F | Wt 233.0 lb

## 2021-08-22 DIAGNOSIS — M869 Osteomyelitis, unspecified: Secondary | ICD-10-CM

## 2021-08-22 DIAGNOSIS — S98319A Complete traumatic amputation of unspecified midfoot, initial encounter: Secondary | ICD-10-CM | POA: Diagnosis not present

## 2021-08-22 DIAGNOSIS — Z89431 Acquired absence of right foot: Secondary | ICD-10-CM | POA: Diagnosis not present

## 2021-08-22 DIAGNOSIS — Q8901 Asplenia (congenital): Secondary | ICD-10-CM

## 2021-08-22 DIAGNOSIS — L089 Local infection of the skin and subcutaneous tissue, unspecified: Secondary | ICD-10-CM

## 2021-08-22 DIAGNOSIS — M4620 Osteomyelitis of vertebra, site unspecified: Secondary | ICD-10-CM

## 2021-08-22 DIAGNOSIS — R7881 Bacteremia: Secondary | ICD-10-CM

## 2021-08-22 DIAGNOSIS — E1169 Type 2 diabetes mellitus with other specified complication: Secondary | ICD-10-CM

## 2021-08-22 DIAGNOSIS — E785 Hyperlipidemia, unspecified: Secondary | ICD-10-CM

## 2021-08-22 DIAGNOSIS — E11628 Type 2 diabetes mellitus with other skin complications: Secondary | ICD-10-CM | POA: Diagnosis not present

## 2021-08-22 DIAGNOSIS — Z9889 Other specified postprocedural states: Secondary | ICD-10-CM

## 2021-08-22 DIAGNOSIS — B9562 Methicillin resistant Staphylococcus aureus infection as the cause of diseases classified elsewhere: Secondary | ICD-10-CM

## 2021-08-22 DIAGNOSIS — A4902 Methicillin resistant Staphylococcus aureus infection, unspecified site: Secondary | ICD-10-CM | POA: Diagnosis not present

## 2021-08-22 DIAGNOSIS — Z23 Encounter for immunization: Secondary | ICD-10-CM

## 2021-08-22 DIAGNOSIS — M86151 Other acute osteomyelitis, right femur: Secondary | ICD-10-CM | POA: Diagnosis not present

## 2021-08-22 MED ORDER — CYCLOBENZAPRINE HCL 10 MG PO TABS
10.0000 mg | ORAL_TABLET | Freq: Three times a day (TID) | ORAL | 2 refills | Status: DC
  Filled 2021-08-22: qty 90, 30d supply, fill #0
  Filled 2021-09-22: qty 90, 30d supply, fill #1
  Filled 2021-10-21: qty 90, 30d supply, fill #2

## 2021-08-22 NOTE — Progress Notes (Signed)
Subjective:  Chief complaint : New onset of back pain in the middle thoracic region with radiation bilaterally to his flanks  Patient ID: Christopher Fernandez, male    DOB: [birth year], 50 y.o.   MRN: [id number]  HPI  50 y.o. 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   He ultimately went to the operating room on April 22 and underwent vertebrectomy, and decompression of his epidural abscess with hardware reconstruction joint neurosurgical and cardiothoracic surgical involvement.   He then had to go back to the operating room on the 29th for redo thoracotomy due to an infected empyema.  Chest tubes were placed.   Unfortunately his MRSA isolate from the operating room was now found to be daptomycin resistant though still sensitive to vancomycin he was switched over to vancomycin along with rifampin given the hardware.   Chest tubes were removed towards the end of his hospital stay.  He now is actually complaining of some swelling and discomfort along the site where his chest tube was placed.  He has had deterioration of his third toe and going to undergo amputation with vascular surgery with Dr. Carlis Abbott on Monday.  We did obtain a repeat MRI of his pelvis due to the concerns he was still having pain in his thigh.  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  His wife had tested positive for COVID-19 and he himself again did. We prescribed him moluprinavir.  Said that he felt  dramatically better after starting the moluprinavir and that he only had a lingering cough for a little while afterwards.  In the interim he has had partial third toe amputation performed by Dr. Carlis Abbott with vascular surgery.  He continues on doxycycline and rifampin.  Since I last saw him in September he had sudden onset roughly 3 weeks ago of mid back pain that is been radiating bilaterally along his flanks.  He does not have fevers chills nausea or malaise.  He initially thought the pain was due to a muscle spasm but it has persisted and is quite worrisome to him.  He had reached out to Dr. Clarice Pole office earlier in the week but has not heard back from them despite several phone calls.  He called [place] yesterday to concerns and we scheduled him for appointment today in clinic.  He also tells me that MyChart told him he needed meningococcal vaccine due to his asplenia      :   Past Medical History:  Diagnosis Date   Alcoholism ([place])    Anemia    Anxiety    Arthritis    knees, hands, lower back   Asthma    Blood transfusion without reported diagnosis    COPD (chronic obstructive pulmonary disease) ([place])    COVID-19 in immunocompromised patient ([place]) 02/21/2021   Depression    Diabetes ([place])    type 2   GERD (gastroesophageal reflux disease)    History of hiatal hernia    HTN (hypertension)    Mass of right chest wall 02/21/2021   MRSA bacteremia    MRSA  infection 01/11/2020   Neuromuscular disorder ([place])    tremors   Osteomyelitis ([facility])    right forefoot   Pancreatitis    Pneumonia    Substance abuse ([place])    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   Wears glasses     Past Surgical History:  Procedure Laterality Date   Amputation Right    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   AMPUTATION Right 10/18/2019   Procedure: RIGHT SECOND TOE AMPUTATION;  Surgeon: Newt Minion, MD;  Location: [place];  Service: Orthopedics;  Laterality: Right;   AMPUTATION Right 11/08/2019    Procedure: RIGHT TRANSMETATARSAL AMPUTATION;  Surgeon: Newt Minion, MD;  Location: [place];  Service: Orthopedics;  Laterality: Right;   AMPUTATION Left 12/02/2020   Procedure: AMPUTATION 2ND TOE;  Surgeon: Elam Dutch, MD;  Location: [facility];  Service: Vascular;  Laterality: Left;   AMPUTATION Left 02/24/2021   Procedure: PARTIAL LEFT THIRD TOE AMPUTATION;  Surgeon: [name], MD;  Location: [facility];  Service: Vascular;  Laterality: Left;   APPLICATION OF WOUND VAC Right 01/10/2020   Procedure: APPLICATION OF WOUND VAC, right foot;  Surgeon: [name], MD;  Location: [facility];  Service: Vascular;  Laterality: Right;   BILIARY DILATION  12/06/2020   Procedure: BILIARY DILATION;  Surgeon: Irving Copas., MD;  Location: [facility];  Service: Gastroenterology;;   BIOPSY  12/06/2020   Procedure: BIOPSY;  Surgeon: Irving Copas., MD;  Location: [facility];  Service: Gastroenterology;;   CHOLECYSTECTOMY  2006   with gallbladder and spleen   COLONOSCOPY  8-10 years ago    in [place] exam per pt   ENDOSCOPIC RETROGRADE CHOLANGIOPANCREATOGRAPHY (ERCP) WITH PROPOFOL N/A 12/06/2020   Procedure: ENDOSCOPIC RETROGRADE CHOLANGIOPANCREATOGRAPHY (ERCP) WITH PROPOFOL;  Surgeon: Irving Copas., MD;  Location: [facility] [place];  Service: Gastroenterology;  Laterality: N/A;   HERNIA REPAIR  2008, 2010   hiatal hernia and 1 additional   NASAL SEPTUM SURGERY     PANCREATIC PSEUDOCYST DRAINAGE     PANCREATIC STENT PLACEMENT  12/06/2020   Procedure: PANCREATIC STENT PLACEMENT;  Surgeon: Irving Copas., MD;  Location: [facility];  Service: Gastroenterology;;   PLEURAL EFFUSION DRAINAGE Right 01/10/2021   Procedure: DRAINAGE OF PLEURAL EFFUSION;  Surgeon: Gaye [place]ack, MD;  Location: [facility];  Service: Thoracic;  Laterality: Right;   REMOVAL OF STONES  12/06/2020   Procedure: REMOVAL OF STONES;  Surgeon: Irving Copas., MD;  Location: [facility];   Service: Gastroenterology;;   [name]  12/06/2020   Procedure: [name];  Surgeon: Irving Copas., MD;  Location: [facility];  Service: Gastroenterology;;   SPLENECTOMY  2006   STUMP REVISION Right 11/24/2019   Procedure: REVISION RIGHT TRANSMETATARSAL AMPUTATION;  Surgeon: Newt Minion, MD;  Location: [facility];  Service: Orthopedics;  Laterality: Right;   TEE WITHOUT CARDIOVERSION N/A 12/04/2020   Procedure: TRANSESOPHAGEAL ECHOCARDIOGRAM (TEE);  Surgeon: Acie Fredrickson Wonda Cheng, MD;  Location[facility] ENDOSCOPY;  Service: Cardiovascular;  Laterality: N/A;   THORACIC EXPOSURE Right 01/03/2021   Procedure: THORACIC EXPOSURE with removal of rib;  Surgeon: Gaye [place], MD;  Location: [place];  Service: Cardiothoracic;  Laterality: Right;   THORACOTOMY Right 01/10/2021   Procedure: REDO THORACOTOMY;  Surgeon: Gaye [place], MD;  Location: [facility] OR;  Service: Thoracic;  Laterality: Right;   [place] VERTEBRECTOMY Right 01/03/2021   Procedure: Decompression of  Thoracic nine and Thoracic ten via corpectomy reconstruction with titanium spacer and rib autogrtaft.  Lateral plate fixation Thoracic eight - Thoracic eleven;  Surgeon: Kristeen Miss, MD;  Location: [place];  Service: Neurosurgery;  Laterality: Right;   TOTAL KNEE ARTHROPLASTY Right 1982, 1984   TRANSMETATARSAL AMPUTATION Left 09/06/2020   Procedure: AMPUTATION LEFT GREAT TOE;  Surgeon: Waynetta Sandy, MD;  Location: [place];  Service: Vascular;  Laterality: Left;   WOUND DEBRIDEMENT Right 01/10/2020   Procedure: incisional DEBRIDEMENT of RIGHT TRANSMETATARSAL WOUND;  Surgeon: [name], MD;  [facility];  Service: Vascular;  Laterality: Right;    Family History  Problem Relation Age of Onset   Diabetes Mother    Hypertension Mother    Hyperlipidemia Mother    Kidney disease Mother    Thyroid disease Mother    Breast cancer Mother        mets   Lung cancer Mother    Diabetes Father    Alcohol abuse  Sister    Sickle cell trait Sister    Diabetes Brother    Asthma Brother    Lung cancer Maternal Grandmother    Kidney disease Maternal Grandmother    Liver cancer Maternal Uncle        x 4-5   Kidney disease Maternal Uncle    Kidney disease Paternal Uncle    Colon cancer Neg Hx    Colon polyps Neg Hx    Esophageal cancer Neg Hx    Rectal cancer Neg Hx    Stomach cancer Neg Hx       Social History   Socioeconomic History   Marital status: Married    Spouse name: [name]   Number of children: 1   Years of education: Not on file   Highest education level: Not on file  Occupational History   Occupation: unemployed  Tobacco Use   Smoking status: Every Day    Packs/day: 0.50    Years: 38.00    Pack years: 19.00    Types: Cigarettes    Start date: 09/14/1982   Smokeless tobacco: Never  Vaping Use   Vaping Use: Never used  Substance and Sexual Activity   Alcohol use: Not Currently    Comment: 2-3 per day, none since 2019   Drug use: Not Currently   Sexual activity: Not on file  Other Topics Concern   Not on file  Social History Narrative   One child, 4 step-kids   Social Determinants of Health   Financial Resource Strain: Not on file  Food Insecurity: Not on file  Transportation Needs: Not on file  Physical Activity: Not on file  Stress: Not on file  Social Connections: Not on file    Allergies  Allergen Reactions   Eggs Or Egg-Derived Products Rash   Morphine And Related Other (See Comments)    Cant take because of pancreatitis   Cocoa Rash     Current Outpatient Medications:    acetaminophen (TYLENOL) 500 MG tablet, Take 2 tablets (1,000 mg total) by mouth every 6 (six) hours. (Patient taking differently: Take 1,000 mg by mouth every 6 (six) hours as needed for moderate pain.), Disp: 30 tablet, Rfl: 0   albuterol (VENTOLIN HFA) 108 (90 Base) MCG/ACT inhaler, INHALE 2 TO 4 PUFFS INTO THE LUNGS EVERY 4 HOURS AS NEEDED FOR WHEEZING OR COUGH. (Patient taking  differently: Inhale 2-4 puffs into the lungs every 4 (four) hours as needed for wheezing or shortness of breath.), Disp: 18 g, Rfl: 11   ARIPiprazole (ABILIFY) 5 MG tablet, Take 1 tablet (5  mg total) by mouth daily., Disp: 90 tablet, Rfl: 2   atorvastatin (LIPITOR) 40 MG tablet, Take 1 tablet (40 mg total) by mouth daily., Disp: 90 tablet, Rfl: 3   cetirizine (ZYRTEC) 10 MG tablet, Take 1 tablet (10 mg total) by mouth daily. (Patient taking differently: Take 10 mg by mouth daily as needed for allergies.), Disp: 30 tablet, Rfl: 11   Continuous Blood Gluc Receiver (DEXCOM G6 RECEIVER) DEVI, 1 Device by Does not apply route 4 (four) times daily., Disp: 1 each, Rfl: 11   Continuous Blood Gluc Sensor (DEXCOM G6 SENSOR) MISC, USE AS DIRECTED 4 TIMES DAILY, Disp: 3 each, Rfl: 11   Continuous Blood Gluc Transmit (DEXCOM G6 TRANSMITTER) MISC, 4 (four) times daily. as directed, Disp: , Rfl:    Continuous Blood Gluc Transmit (DEXCOM G6 TRANSMITTER) MISC, USE AS DIRECTED FOUR TIMES DAILY, Disp: 1 each, Rfl: 11   cyclobenzaprine (FLEXERIL) 10 MG tablet, Take 1 tablet (10 mg total) by mouth 3 (three) times daily., Disp: 90 tablet, Rfl: 2   doxycycline (VIBRA-TABS) 100 MG tablet, Take 1 tablet (100 mg total) by mouth 2 (two) times daily for 14 days., Disp: 28 tablet, Rfl: 0   doxycycline (VIBRA-TABS) 100 MG tablet, Take 1 tablet (100 mg total) by mouth 2 (two) times daily., Disp: 60 tablet, Rfl: 11   DULoxetine (CYMBALTA) 60 MG capsule, TAKE 1 CAPSULE (60 MG TOTAL) BY MOUTH DAILY. (Patient taking differently: Take 60 mg by mouth daily.), Disp: 90 capsule, Rfl: 2   EASY TOUCH PEN NEEDLES 31G X 5 MM MISC, Inject 1 application as directed 3 (three) times daily., Disp: 100 each, Rfl: 11   empagliflozin (JARDIANCE) 25 MG TABS tablet, TAKE 1 TABLET BY MOUTH DAILY. (Patient taking differently: Take 25 mg by mouth daily.), Disp: 90 tablet, Rfl: 3   fluticasone-salmeterol (ADVAIR) 250-50 MCG/ACT AEPB, Inhale 1 puff into the  lungs in the morning and at bedtime., Disp: 60 each, Rfl: 2   gabapentin (NEURONTIN) 300 MG capsule, Take 3 capsules (900 mg total) by mouth 3 (three) times daily., Disp: 270 capsule, Rfl: 2   ibuprofen (ADVIL) 400 MG tablet, Take 1 tablet (400 mg total) by mouth every 8 (eight) hours as needed for mild pain., Disp: 60 tablet, Rfl: 0   insulin glargine-yfgn (SEMGLEE, YFGN,) 100 UNIT/ML Pen, Inject 10 Units into the skin daily., Disp: 15 mL, Rfl: 1   insulin lispro (HUMALOG KWIKPEN) 100 UNIT/ML KwikPen, Inject 6-10 Units into the skin 3 (three) times daily., Disp: 15 mL, Rfl: 11   Lancets (FREESTYLE) lancets, Use as instructed, Disp: 100 each, Rfl: 12   lidocaine (LIDODERM) 5 %, PLACE 1 PATCH ONTO THE SKIN DAILY. REMOVE & DISCARD PATCH WITHIN 12 HOURS OR AS DIRECTED BY MD (Patient taking differently: 1 patch daily. Remove & discard patch within 12 hours or as directed by MD), Disp: 30 patch, Rfl: 0   metFORMIN (GLUCOPHAGE) 1000 MG tablet, Take 1 tablet (1,000 mg total) by mouth 2 (two) times daily with a meal., Disp: 180 tablet, Rfl: 3   metoprolol succinate (TOPROL-XL) 100 MG 24 hr tablet, Take 1.5 tablets (150 mg total) by mouth at bedtime. Take with or immediately following a meal., Disp: 120 tablet, Rfl: 3   mirtazapine (REMERON) 30 MG tablet, TAKE 1 TABLET (30 MG TOTAL) BY MOUTH AT BEDTIME. (Patient taking differently: Take 30 mg by mouth at bedtime.), Disp: 90 tablet, Rfl: 3   montelukast (SINGULAIR) 10 MG tablet, TAKE 1 TABLET (10 MG TOTAL) BY  MOUTH AT BEDTIME. (Patient taking differently: Take 10 mg by mouth in the morning.), Disp: 90 tablet, Rfl: 11   omeprazole (PRILOSEC) 40 MG capsule, Take 1 capsule (40 mg total) by mouth daily., Disp: 30 capsule, Rfl: 3   rifampin (RIFADIN) 300 MG capsule, Take 1 capsule (300 mg total) by mouth 2 (two) times daily., Disp: 60 capsule, Rfl: 11   sitaGLIPtin (JANUVIA) 100 MG tablet, TAKE 1 TABLET (100 MG TOTAL) BY MOUTH DAILY. (Patient taking differently: Take  100 mg by mouth daily.), Disp: 90 tablet, Rfl: 10   bismuth subsalicylate (PEPTO BISMOL) 262 MG/15ML suspension, Take 30 mLs by mouth every 6 (six) hours as needed for indigestion or diarrhea or loose stools., Disp: , Rfl:    Heparin Lock Flush (HEPARIN FLUSH) 10 UNIT/ML SOLN injection, Inject 100 Units into the vein 2 (two) times daily., Disp: , Rfl:    Review of Systems  Constitutional:  Negative for activity change, appetite change, chills, diaphoresis, fatigue, fever and unexpected weight change.  HENT:  Negative for congestion, rhinorrhea, sinus pressure, sneezing, sore throat and trouble swallowing.   Eyes:  Negative for photophobia and visual disturbance.  Respiratory:  Negative for cough, chest tightness, shortness of breath, wheezing and stridor.   Cardiovascular:  Negative for chest pain, palpitations and leg swelling.  Gastrointestinal:  Negative for abdominal distention, abdominal pain, anal bleeding, blood in stool, constipation, diarrhea, nausea and vomiting.  Genitourinary:  Negative for difficulty urinating, dysuria, flank pain and hematuria.  Musculoskeletal:  Positive for back pain and myalgias. Negative for arthralgias, gait problem and joint swelling.  Skin:  Negative for color change, pallor, rash and wound.  Neurological:  Negative for dizziness, tremors, weakness and light-headedness.  Hematological:  Negative for adenopathy. Does not bruise/bleed easily.  Psychiatric/Behavioral:  Negative for agitation, behavioral problems, confusion, decreased concentration, dysphoric mood and sleep disturbance.       Objective:   Physical Exam Constitutional:      Appearance: He is well-developed.  HENT:     Head: Normocephalic and atraumatic.  Eyes:     Conjunctiva/sclera: Conjunctivae normal.  Cardiovascular:     Rate and Rhythm: Normal rate and regular rhythm.  Pulmonary:     Effort: Pulmonary effort is normal. No respiratory distress.     Breath sounds: No wheezing.   Abdominal:     General: There is no distension.     Palpations: Abdomen is soft.  Musculoskeletal:        General: No swelling or tenderness. Normal range of motion.     Cervical back: Normal range of motion and neck supple.  Skin:    General: Skin is warm and dry.     Coloration: Skin is not pale.     Findings: No erythema or rash.  Neurological:     General: No focal deficit present.     Mental Status: He is alert and oriented to person, place, and time.  Psychiatric:        Mood and Affect: Mood normal.        Behavior: Behavior normal.        Thought Content: Thought content normal.        Judgment: Judgment normal.     Left foot 02/21/2021:       03/28/2021       LE not examined today       Assessment & Plan:   50 y.o. 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   He ultimately went to the operating room on April 22 and underwent vertebrectomy, and decompression of his epidural abscess with hardware reconstruction joint neurosurgical and cardiothoracic surgical involvement.   He then had to go back to the operating room on the 29th for redo thoracotomy due to an infected empyema.  Chest tubes were placed.   Unfortunately his MRSA isolate from the operating room was now found to be daptomycin resistant though still sensitive to vancomycin he was switched over to vancomycin along with rifampin given the hardware.   Chest tubes were removed towards the end of his hospital stay.  He also previously been found to have osteomyelitis of the pubic  symphysis.  I repeated an MRI of this area because he had some discomfort in his leg and sensation in the muscle.  MRI showed improvement of the myositis as well as the osteomyelitis at the site.  Now undergone resection of third toe  Nikolos has now acute worsening of back pain which is highly concerning given the fact that he had to have a surgery there with vertebrectomy  Sed rate C-reactive protein comprehensive metabolic panel and CBC with differential.  I will continue his doxycycline and rifampin.  I am ordering MRI of the thoracic and lumbar spine with and without contrast  Asplenia I will give him meningococcal vaccine today  Hyperlipidemia is continuing on atorvastatin

## 2021-08-22 NOTE — Addendum Note (Signed)
Addended by: Leatrice Jewels on: 08/22/2021 09:25 AM   Modules accepted: Orders

## 2021-08-23 LAB — CBC WITH DIFFERENTIAL/PLATELET
Absolute Monocytes: 437 cells/uL (ref 200–950)
Basophils Absolute: 32 cells/uL (ref 0–200)
Basophils Relative: 0.7 %
Eosinophils Absolute: 153 cells/uL (ref 15–500)
Eosinophils Relative: 3.4 %
HCT: 42.3 % (ref 38.5–50.0)
Hemoglobin: 13.4 g/dL (ref 13.2–17.1)
Lymphs Abs: 1296 cells/uL (ref 850–3900)
MCH: 28.9 pg (ref 27.0–33.0)
MCHC: 31.7 g/dL — ABNORMAL LOW (ref 32.0–36.0)
MCV: 91.2 fL (ref 80.0–100.0)
MPV: 10.3 fL (ref 7.5–12.5)
Monocytes Relative: 9.7 %
Neutro Abs: 2583 cells/uL (ref 1500–7800)
Neutrophils Relative %: 57.4 %
Platelets: 248 10*3/uL (ref 140–400)
RBC: 4.64 10*6/uL (ref 4.20–5.80)
RDW: 14.1 % (ref 11.0–15.0)
Total Lymphocyte: 28.8 %
WBC: 4.5 10*3/uL (ref 3.8–10.8)

## 2021-08-23 LAB — COMPLETE METABOLIC PANEL WITH GFR
AG Ratio: 1.5 (calc) (ref 1.0–2.5)
ALT: 58 U/L — ABNORMAL HIGH (ref 9–46)
AST: 42 U/L — ABNORMAL HIGH (ref 10–35)
Albumin: 4.2 g/dL (ref 3.6–5.1)
Alkaline phosphatase (APISO): 79 U/L (ref 35–144)
BUN: 11 mg/dL (ref 7–25)
CO2: 24 mmol/L (ref 20–32)
Calcium: 9.3 mg/dL (ref 8.6–10.3)
Chloride: 101 mmol/L (ref 98–110)
Creat: 0.85 mg/dL (ref 0.70–1.30)
Globulin: 2.8 g/dL (calc) (ref 1.9–3.7)
Glucose, Bld: 101 mg/dL — ABNORMAL HIGH (ref 65–99)
Potassium: 4.9 mmol/L (ref 3.5–5.3)
Sodium: 134 mmol/L — ABNORMAL LOW (ref 135–146)
Total Bilirubin: 0.5 mg/dL (ref 0.2–1.2)
Total Protein: 7 g/dL (ref 6.1–8.1)
eGFR: 105 mL/min/{1.73_m2} (ref >=60)

## 2021-08-23 LAB — C-REACTIVE PROTEIN: CRP: 3.9 mg/L (ref <8.0)

## 2021-08-23 LAB — SEDIMENTATION RATE: Sed Rate: 11 mm/h (ref 0–20)

## 2021-08-25 ENCOUNTER — Other Ambulatory Visit (HOSPITAL_COMMUNITY): Payer: Self-pay

## 2021-08-28 DIAGNOSIS — Z6829 Body mass index (BMI) 29.0-29.9, adult: Secondary | ICD-10-CM | POA: Diagnosis not present

## 2021-08-28 DIAGNOSIS — M4620 Osteomyelitis of vertebra, site unspecified: Secondary | ICD-10-CM | POA: Diagnosis not present

## 2021-08-31 ENCOUNTER — Ambulatory Visit (HOSPITAL_COMMUNITY)
Admission: RE | Admit: 2021-08-31 | Discharge: 2021-08-31 | Disposition: A | Discharge status: Home / Self Care | Payer: 59 | Source: Ambulatory Visit | Attending: Infectious Disease | Admitting: Infectious Disease

## 2021-08-31 DIAGNOSIS — Z8669 Personal history of other diseases of the nervous system and sense organs: Secondary | ICD-10-CM | POA: Diagnosis not present

## 2021-08-31 DIAGNOSIS — M5126 Other intervertebral disc displacement, lumbar region: Secondary | ICD-10-CM | POA: Diagnosis not present

## 2021-08-31 DIAGNOSIS — M4620 Osteomyelitis of vertebra, site unspecified: Secondary | ICD-10-CM | POA: Insufficient documentation

## 2021-08-31 DIAGNOSIS — Z9889 Other specified postprocedural states: Secondary | ICD-10-CM | POA: Diagnosis not present

## 2021-08-31 DIAGNOSIS — G061 Intraspinal abscess and granuloma: Secondary | ICD-10-CM | POA: Diagnosis not present

## 2021-08-31 DIAGNOSIS — M4324 Fusion of spine, thoracic region: Secondary | ICD-10-CM | POA: Diagnosis not present

## 2021-08-31 DIAGNOSIS — Z981 Arthrodesis status: Secondary | ICD-10-CM | POA: Diagnosis not present

## 2021-08-31 MED ORDER — GADOBUTROL 1 MMOL/ML IV SOLN
10.0000 mL | Freq: Once | INTRAVENOUS | Status: AC | PRN
  Administered 2021-08-31: 09:00:00 10 mL via INTRAVENOUS

## 2021-09-01 ENCOUNTER — Telehealth: Payer: Self-pay

## 2021-09-01 NOTE — Telephone Encounter (Signed)
-----   Message from Truman Hayward, MD sent at 09/01/2021  3:41 PM EST -----  MRI reassuring ----- Message ----- From: Buel Ream, Rad Results In Sent: 09/01/2021  11:21 AM EST To: Truman Hayward, MD

## 2021-09-01 NOTE — Telephone Encounter (Signed)
Informed patient of MRI result via my chart.    [place], CMA

## 2021-09-04 ENCOUNTER — Other Ambulatory Visit (HOSPITAL_COMMUNITY): Payer: Self-pay

## 2021-09-09 ENCOUNTER — Other Ambulatory Visit (HOSPITAL_COMMUNITY): Payer: Self-pay

## 2021-09-09 MED FILL — Empagliflozin Tab 25 MG: ORAL | 90 days supply | Qty: 90 | Fill #2 | Status: AC

## 2021-09-09 MED FILL — Sitagliptin Phosphate Tab 100 MG (Base Equiv): ORAL | 90 days supply | Qty: 90 | Fill #2 | Status: AC

## 2021-09-12 ENCOUNTER — Other Ambulatory Visit (HOSPITAL_COMMUNITY): Payer: Self-pay

## 2021-09-22 ENCOUNTER — Other Ambulatory Visit (HOSPITAL_COMMUNITY): Payer: Self-pay

## 2021-09-22 MED FILL — Albuterol Sulfate Inhal Aero 108 MCG/ACT (90MCG Base Equiv): RESPIRATORY_TRACT | 17 days supply | Qty: 18 | Fill #4 | Status: AC

## 2021-09-23 ENCOUNTER — Other Ambulatory Visit (HOSPITAL_COMMUNITY): Payer: Self-pay

## 2021-09-26 ENCOUNTER — Other Ambulatory Visit: Payer: Self-pay

## 2021-09-26 ENCOUNTER — Ambulatory Visit (AMBULATORY_SURGERY_CENTER): Payer: Self-pay

## 2021-09-26 ENCOUNTER — Other Ambulatory Visit (HOSPITAL_COMMUNITY): Payer: Self-pay

## 2021-09-26 VITALS — Ht 75.0 in | Wt 235.0 lb

## 2021-09-26 DIAGNOSIS — Z1211 Encounter for screening for malignant neoplasm of colon: Secondary | ICD-10-CM

## 2021-09-26 MED ORDER — NA SULFATE-K SULFATE-MG SULF 17.5-3.13-1.6 GM/177ML PO SOLN
1.0000 | Freq: Once | ORAL | 0 refills | Status: AC
  Filled 2021-09-26: qty 354, 1d supply, fill #0

## 2021-09-26 NOTE — Progress Notes (Signed)
Denies allergies to eggs or soy products. Denies complication of anesthesia or sedation. Denies use of weight loss medication. Denies use of O2.   [facility]i instructions given for colonoscopy.   Patient confirms that he is not taking Heparin at this time.

## 2021-09-29 ENCOUNTER — Encounter: Payer: Self-pay | Admitting: Family Medicine

## 2021-09-30 ENCOUNTER — Other Ambulatory Visit: Payer: Self-pay | Admitting: Family Medicine

## 2021-09-30 DIAGNOSIS — R4589 Other symptoms and signs involving emotional state: Secondary | ICD-10-CM

## 2021-10-01 ENCOUNTER — Other Ambulatory Visit (HOSPITAL_COMMUNITY): Payer: Self-pay

## 2021-10-01 ENCOUNTER — Encounter: Payer: Self-pay | Admitting: Gastroenterology

## 2021-10-02 ENCOUNTER — Ambulatory Visit (INDEPENDENT_AMBULATORY_CARE_PROVIDER_SITE_OTHER): Payer: 59 | Admitting: Licensed Clinical Social Worker

## 2021-10-02 ENCOUNTER — Telehealth (HOSPITAL_COMMUNITY): Payer: Self-pay | Admitting: Psychiatry

## 2021-10-02 ENCOUNTER — Other Ambulatory Visit: Payer: Self-pay

## 2021-10-02 DIAGNOSIS — F1010 Alcohol abuse, uncomplicated: Secondary | ICD-10-CM

## 2021-10-02 DIAGNOSIS — F418 Other specified anxiety disorders: Secondary | ICD-10-CM

## 2021-10-02 NOTE — Telephone Encounter (Signed)
D:  Christopher Chapman, LCSW, LCAS referred pt to [facility].  A: Placed call and oriented pt to [facility].  Encouraged pt to verify his benefits.  R:  Pt receptive.
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
## 2021-10-07 ENCOUNTER — Ambulatory Visit: Payer: 59 | Admitting: Gastroenterology

## 2021-10-07 ENCOUNTER — Encounter: Payer: Self-pay | Admitting: Gastroenterology

## 2021-10-07 ENCOUNTER — Other Ambulatory Visit: Payer: Self-pay

## 2021-10-07 VITALS — BP 114/69 | HR 76 | Temp 98.0°F | Ht 75.0 in | Wt 235.0 lb

## 2021-10-07 MED ORDER — FLEET ENEMA 7-19 GM/118ML RE ENEM
1.0000 | ENEMA | Freq: Once | RECTAL | Status: AC
  Administered 2021-10-07: 13:00:00 1 via RECTAL

## 2021-10-07 MED ORDER — SODIUM CHLORIDE 0.9 % IV SOLN: 500.0000 mL | Freq: Once | INTRAVENOUS | Status: DC

## 2021-10-07 NOTE — Progress Notes (Signed)
VS  DT ? ?Pt's states no medical or surgical changes since previsit or office visit. ? ?

## 2021-10-07 NOTE — Progress Notes (Signed)
Unfortunately even after 2 additional enemas, the patient was not clear.  He will be rescheduled with a 2-day preparation.  Justice Britain, MD [facility] Office # [phone]

## 2021-10-07 NOTE — Progress Notes (Signed)
PATIENT RESCHEDULED FOR 10/22/21 @ 4PM. A SAMPLE OF PLENVU PROVIDED AND NEW INSTRUCTIONS GIVEN FOR 2-DAY PREP WITH MIRALAX.   PATIENT VERBALIZED UNDERSTANDING OF NEW INSTRUCTIONS

## 2021-10-09 ENCOUNTER — Encounter (HOSPITAL_COMMUNITY): Payer: Self-pay | Admitting: Psychiatry

## 2021-10-09 ENCOUNTER — Ambulatory Visit: Payer: 59 | Admitting: Family Medicine

## 2021-10-09 ENCOUNTER — Other Ambulatory Visit: Payer: Self-pay

## 2021-10-09 ENCOUNTER — Other Ambulatory Visit (HOSPITAL_COMMUNITY): Payer: 59 | Attending: Psychiatry | Admitting: Psychiatry

## 2021-10-09 ENCOUNTER — Other Ambulatory Visit (HOSPITAL_COMMUNITY): Payer: Self-pay

## 2021-10-09 DIAGNOSIS — F17210 Nicotine dependence, cigarettes, uncomplicated: Secondary | ICD-10-CM | POA: Insufficient documentation

## 2021-10-09 DIAGNOSIS — Z560 Unemployment, unspecified: Secondary | ICD-10-CM | POA: Insufficient documentation

## 2021-10-09 DIAGNOSIS — G479 Sleep disorder, unspecified: Secondary | ICD-10-CM | POA: Insufficient documentation

## 2021-10-09 DIAGNOSIS — Z8616 Personal history of COVID-19: Secondary | ICD-10-CM | POA: Diagnosis not present

## 2021-10-09 DIAGNOSIS — F419 Anxiety disorder, unspecified: Secondary | ICD-10-CM | POA: Diagnosis not present

## 2021-10-09 DIAGNOSIS — Z658 Other specified problems related to psychosocial circumstances: Secondary | ICD-10-CM | POA: Insufficient documentation

## 2021-10-09 DIAGNOSIS — F329 Major depressive disorder, single episode, unspecified: Secondary | ICD-10-CM | POA: Insufficient documentation

## 2021-10-09 DIAGNOSIS — F1911 Other psychoactive substance abuse, in remission: Secondary | ICD-10-CM | POA: Insufficient documentation

## 2021-10-09 NOTE — Progress Notes (Signed)
Virtual Visit via Video Note   I connected with [name] on 10/09/21 at  9:00 AM EDT by a video enabled telemedicine application and verified that I am speaking with the correct person using two identifiers.   At orientation to the IOP program, Case Manager discussed the limitations of evaluation and management by telemedicine and the availability of in person appointments. The patient expressed understanding and agreed to proceed with virtual visits throughout the duration of the program.   Location:  Patient: Patient Home Provider: OPT [place] Office   History of Present Illness: MDD with anxious distress    Observations/Objective: Check In: Case Manager checked in with all participants to review discharge dates, insurance authorizations, work-related documents and needs from the treatment team regarding medications. Kron stated needs and engaged in discussion.    Initial Therapeutic Activity: Counselor facilitated a check-in with Joson to assess for safety, sobriety and medication compliance.  Counselor also inquired about Masayuki's current emotional ratings, as well as any significant changes in thoughts, feelings or behavior since previous check in.  Ziere presented for session on time and was alert, oriented x5, with no evidence or self-report of active SI/HI or A/V H.  Grey reported compliance with medication and denied use of alcohol or illicit substances.  Carel reported scores of 5/10 for depression, 9/10 for anxiety, and 4/10 for anger/irritability.  Quentez denied any recent outbursts or panic attacks.  Belton reported that a recent struggle has been struggling with addiction, depression, and anxiety, which has led him to seek admission into our program.  Arlo stated I have a long history of different problems so I'm trying to learn some new coping skills.  Howard reported that a recent success was going out shopping with his wife yesterday to cut back on isolation, as  well as putting more focus on his faith.  Revere reported that his goal today is to complete some social security paperwork this afternoon to stay productive.      Second Therapeutic Activity: Counselor introduced topic of anger management today.  Counselor virtually shared a handout with members on this subject featuring a variety of coping skills, and facilitated discussion on these approaches.  Examples included raising awareness of anger triggers, practicing deep breathing, keeping an anger log to better understand episodes, using diversion activities to distract oneself for 30 minutes, taking a time out when necessary, and being mindful of warning signs tied to thoughts or behavior.  Counselor inquired about which techniques group members have used before, what has proved to be helpful, what their unique warning signs might be, as well as what they will try out in the future to assist with de-escalation.  Intervention was effective, as evidenced by [name] actively participating in discussion on subject, reporting that dealing with anger has been a problem for him much of his life, stating There are a ton of things that have been triggers for me.  He reported that he has attempted to mask his feelings for a very long time, including use of substances like alcohol, which he acknowledged only made the situation worse.  Devan stated I started to become out of touch with my feelings and couldn't really understand what was going on under the surface anymore.  Jaymason reported that some consequences he has faced included DWI's, fights, and development of addictions to various vices.  Akiel reported that he is typically triggered to anger when feelings criticized, insulted, or disrespected, and believes many of his current issues stem  from problems in the marriage.  Dawid expressed motivation to improve anger management approach by keeping a daily 'anger journal' if he can ensure privacy of the contents  from his wife, in addition to using communication skills such as I statements to express how he feels in a clear, respectful manner when problems arise between them.  Youssouf stated I know I have to get these things out of me and establish some boundaries.    Assessment and Plan: Counselor recommends that [facility] remain in IOP treatment to better manage mental health symptoms, ensure stability and pursue completion of treatment plan goals. Counselor recommends adherence to crisis/safety plan, taking medications as prescribed, and following up with medical professionals if any issues arise.   Follow Up Instructions: Counselor will send Webex link for next session. Ishmel was advised to call back or seek an in-person evaluation if the symptoms worsen or if the condition fails to improve as anticipated.   I provided 180 minutes of non-face-to-face time during this encounter.   [name], [place], LCAS 10/09/21

## 2021-10-09 NOTE — Progress Notes (Signed)
Virtual Visit via Video Note  I connected with Christopher Fernandez on @TODAY @ at  9:00 AM EST by a video enabled telemedicine application and verified that I am speaking with the correct person using two identifiers.  Location: Patient: at home Provider: at office   I discussed the limitations of evaluation and management by telemedicine and the availability of in person appointments. The patient expressed understanding and agreed to proceed.   I discussed the assessment and treatment plan with the patient. The patient was provided an opportunity to ask questions and all were answered. The patient agreed with the plan and demonstrated an understanding of the instructions.   The patient was advised to call back or seek an in-person evaluation if the symptoms worsen or if the condition fails to improve as anticipated.  I provided 20 minutes of non-face-to-face time during this encounter.   Dellia Nims, M.Ed,CNA   Patient ID: Christopher Fernandez, male   DOB: 09-04-1971, 51 y.o.   MRN: [id number] As previous CCA states:  "I'm having quality of life is terrible, always stay at home, dont have friends except my cat, just knowing my history of alcohol and drungs recent. I'll go through periods of drinking, little drug abuse in the past few years. Every few weeks I'll still get plastered on alcohol. SI within past year, attepted 30 years ago, nothing recent. Multiple health issues as a result of drugs and alcohol. My marriage sucks my wife is a gambling addict so i know we have codependency issues gong on there so i want to be a whole person and fully functioning adult (unemployed 4 years financially dependent). Hx 5 long term SUD progrsms (6 months plus) sever 30 day programs, 50 detox, NA/AA off and on longest engagement with sobriety 1 year twice. Previously sober for 4 years after one program. Client reported medications currently not helpful for anxiety and depression. Client has decreased in functioning over  years. Per checklist depression, anxiety, insominia, racing thoughts." Pt started MH-IOP today. States he is hoping that the group will be beneficial.  Pt was active in all the groups today.  Denies SI/HI or A/V hallucinations.  Denies any drugs/ETOH. A:  Oriented pt.  Pt gave verbal consent for treatment, to release chart information to referred providers and to complete any forms if needed.  Pt also gave consent for attending group virtually d/t COVID social distancing restrictions.  Encouraged support groups. Pt will improve his mood as evidenced by being happy again, maintaining abstinence, managing his mood and coping with daily stressors for 5 out of 7 days for 60 days. Refer pt to a psychiatrist and therapist if he doesn't already have providers.  Strongly recommend AA or NA groups and obtain a sponsor.  R:  Pt receptive.  Dellia Nims, M.Ed,CNA

## 2021-10-10 ENCOUNTER — Other Ambulatory Visit (HOSPITAL_BASED_OUTPATIENT_CLINIC_OR_DEPARTMENT_OTHER): Payer: 59 | Admitting: Family

## 2021-10-10 ENCOUNTER — Other Ambulatory Visit (HOSPITAL_COMMUNITY): Payer: Self-pay

## 2021-10-10 DIAGNOSIS — F419 Anxiety disorder, unspecified: Secondary | ICD-10-CM | POA: Diagnosis not present

## 2021-10-10 DIAGNOSIS — G479 Sleep disorder, unspecified: Secondary | ICD-10-CM | POA: Diagnosis not present

## 2021-10-10 DIAGNOSIS — F329 Major depressive disorder, single episode, unspecified: Secondary | ICD-10-CM

## 2021-10-10 DIAGNOSIS — Z658 Other specified problems related to psychosocial circumstances: Secondary | ICD-10-CM | POA: Diagnosis not present

## 2021-10-10 DIAGNOSIS — Z8616 Personal history of COVID-19: Secondary | ICD-10-CM | POA: Diagnosis not present

## 2021-10-10 DIAGNOSIS — F1911 Other psychoactive substance abuse, in remission: Secondary | ICD-10-CM | POA: Diagnosis not present

## 2021-10-10 DIAGNOSIS — Z560 Unemployment, unspecified: Secondary | ICD-10-CM | POA: Diagnosis not present

## 2021-10-10 DIAGNOSIS — F17210 Nicotine dependence, cigarettes, uncomplicated: Secondary | ICD-10-CM | POA: Diagnosis not present

## 2021-10-10 MED ORDER — HYDROXYZINE PAMOATE 25 MG PO CAPS
25.0000 mg | ORAL_CAPSULE | Freq: Every evening | ORAL | 0 refills | Status: DC | PRN
  Filled 2021-10-10: qty 30, 30d supply, fill #0

## 2021-10-10 NOTE — Progress Notes (Signed)
Virtual Visit via Telephone Note  I connected with Christopher Fernandez on 10/14/21 at  9:00 AM EST by telephone and verified that I am speaking with the correct person using two identifiers.  Location: Patient: Home Provider: Office   I discussed the limitations, risks, security and privacy concerns of performing an evaluation and management service by telephone and the availability of in person appointments. I also discussed with the patient that there may be a patient responsible charge related to this service. The patient expressed understanding and agreed to proceed    I discussed the assessment and treatment plan with the patient. The patient was provided an opportunity to ask questions and all were answered. The patient agreed with the plan and demonstrated an understanding of the instructions.   The patient was advised to call back or seek an in-person evaluation if the symptoms worsen or if the condition fails to improve as anticipated.  I provided 20 minutes of non-face-to-face time during this encounter.   Derrill Center, NP    Psychiatric Initial Adult Assessment   Patient Identification: Christopher Fernandez MRN:  [id number] Date of Evaluation:  10/10/2021 Referral Source: Alphonzo Lemmings Chief Complaint:  " I need to get my mental health checked." Visit Diagnosis:    ICD-10-CM   1. Major depressive disorder, single episode with anxious distress  F32.9       History of Present Illness:  Christopher Fernandez is a 51 year old male was referred due to depression and anxiety.  States he is followed by an office therapist K. Brone.  He reports stressors related to substance abuse history.  Reports recent relapse however states, I know I need to get my mental health under wraps.  He reports mood irritability and difficulty sleeping at night.  Discussed initiating hydroxyzine and patient may benefit from a dual diagnosis programming for depression and alcohol use.  Chatting with receptive to plan.   Plans to follow-up with additional outpatient resources.  He reports multiple psychosocial stressors.  Reports he is currently unemployed. Stated a strained relationship between he and his wife.  Dates he has been to multiple rehabilitation facility.  Reports financial concerns and increased racing thoughts.  Patient started intensive outpatient programming, with plans to attend a chemical dependency program. 10/10/2021  Associated Signs/Symptoms: Depression Symptoms:  depressed mood, anxiety, (Hypo) Manic Symptoms:  Distractibility, Irritable Mood, Anxiety Symptoms:  Excessive Worry, Psychotic Symptoms:  Hallucinations: None PTSD Symptoms: NA  Past Psychiatric History:   Previous Psychotropic Medications: No   Substance Abuse History in the last 12 months:  Yes.    Consequences of Substance Abuse: NA  Past Medical History:  Past Medical History:  Diagnosis Date   Alcoholism ([place])    Allergy    Anemia    Anxiety    Arthritis    knees, hands, lower back   Asthma    Blood transfusion without reported diagnosis    COPD (chronic obstructive pulmonary disease) ([name])    COVID-19 in immunocompromised patient ([place]) 02/21/2021   Depression    Diabetes ([place])    type 2   GERD (gastroesophageal reflux disease)    History of hiatal hernia    HTN (hypertension)    Mass of right chest wall 02/21/2021   MRSA bacteremia    MRSA infection 01/11/2020   Neuromuscular disorder (H[facility])    tremors   Osteomyelitis (H[facility])    right forefoot   Pancreatitis    Pneumonia    Substance abuse ([place])  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   Wears glasses     Past Surgical History:  Procedure Laterality Date   Amputation Right    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   AMPUTATION Right 10/18/2019   Procedure: RIGHT SECOND TOE AMPUTATION;  Surgeon: Newt Minion, MD;  Location: [place];  Service: Orthopedics;  Laterality: Right;   AMPUTATION Right 11/08/2019   Procedure: RIGHT TRANSMETATARSAL AMPUTATION;   Surgeon: Newt Minion, MD;  Location: [place];  Service: Orthopedics;  Laterality: Right;   AMPUTATION Left 12/02/2020   Procedure: AMPUTATION 2ND TOE;  Surgeon: Elam Dutch, MD;  Location: [place];  Service: Vascular;  Laterality: Left;   AMPUTATION Left 02/24/2021   Procedure: PARTIAL LEFT THIRD TOE AMPUTATION;  Surgeon: [name], MD;  Location: [place];  Service: Vascular;  Laterality: Left;   APPLICATION OF WOUND VAC Right 01/10/2020   Procedure: APPLICATION OF WOUND VAC, right foot;  Surgeon: [name], MD;  Location: [place];  Service: Vascular;  Laterality: Right;   BILIARY DILATION  12/06/2020   Procedure: BILIARY DILATION;  Surgeon: Irving Copas., MD;  Location: [place];  Service: Gastroenterology;;   BIOPSY  12/06/2020   Procedure: BIOPSY;  Surgeon: Irving Copas., MD;  Location: [place];  Service: Gastroenterology;;   CHOLECYSTECTOMY  2006   with gallbladder and spleen   COLONOSCOPY  8-10 years ago    in [place] exam per pt   ENDOSCOPIC RETROGRADE CHOLANGIOPANCREATOGRAPHY (ERCP) WITH PROPOFOL N/A 12/06/2020   Procedure: ENDOSCOPIC RETROGRADE CHOLANGIOPANCREATOGRAPHY (ERCP) WITH PROPOFOL;  Surgeon: Irving Copas., MD;  Location: [place];  Service: Gastroenterology;  Laterality: N/A;   HERNIA REPAIR  2008, 2010   hiatal hernia and 1 additional   NASAL SEPTUM SURGERY     PANCREATIC PSEUDOCYST DRAINAGE     PANCREATIC STENT PLACEMENT  12/06/2020   Procedure: PANCREATIC STENT PLACEMENT;  Surgeon: Irving Copas., MD;  Location: [place];  Service: Gastroenterology;;   PLEURAL EFFUSION DRAINAGE Right 01/10/2021   Procedure: DRAINAGE OF PLEURAL EFFUSION;  Surgeon: Gaye Pollack, MD;  Location: [place];  Service: Thoracic;  Laterality: Right;   REMOVAL OF STONES  12/06/2020   Procedure: REMOVAL OF STONES;  Surgeon: Irving Copas., MD;  Location: [place];  Service: Gastroenterology;;    [name]  12/06/2020   Procedure: [name];  Surgeon: Irving Copas., MD;  Location: [place];  Service: Gastroenterology;;   SPLENECTOMY  2006   STUMP REVISION Right 11/24/2019   Procedure: REVISION RIGHT TRANSMETATARSAL AMPUTATION;  Surgeon: Newt Minion, MD;  Location: [place];  Service: Orthopedics;  Laterality: Right;   TEE WITHOUT CARDIOVERSION N/A 12/04/2020   Procedure: TRANSESOPHAGEAL ECHOCARDIOGRAM (TEE);  Surgeon: Acie Fredrickson Wonda Cheng, MD;  [facility];  Service: Cardiovascular;  Laterality: N/A;   THORACIC EXPOSURE Right 01/03/2021   Procedure: THORACIC EXPOSURE with removal of rib;  Surgeon: Gaye Pollack, MD;  Location: [place];  Service: Cardiothoracic;  Laterality: Right;   THORACOTOMY Right 01/10/2021   Procedure: REDO THORACOTOMY;  Surgeon: Gaye Pollack, MD;  Location: [facility] OR;  Service: Thoracic;  Laterality: Right;   [place] VERTEBRECTOMY Right 01/03/2021   Procedure: Decompression of  Thoracic nine and Thoracic ten via corpectomy reconstruction with titanium spacer and rib autogrtaft. Lateral plate fixation Thoracic eight - Thoracic eleven;  Surgeon: Kristeen Miss, MD;  Location: [place];  Service: Neurosurgery;  Laterality: Right;   TOTAL KNEE ARTHROPLASTY Right 1982, 1984   TRANSMETATARSAL AMPUTATION Left  09/06/2020   Procedure: AMPUTATION LEFT GREAT TOE;  Surgeon: Waynetta Sandy, MD;  Location: [place];  Service: Vascular;  Laterality: Left;   WOUND DEBRIDEMENT Right 01/10/2020   Procedure: incisional DEBRIDEMENT of RIGHT TRANSMETATARSAL WOUND;  Surgeon: [name], MD;  Location[facility] OR;  Service: Vascular;  Laterality: Right;    Family Psychiatric History:   Family History:  Family History  Problem Relation Age of Onset   Diabetes Mother    Hypertension Mother    Hyperlipidemia Mother    Kidney disease Mother    Thyroid disease Mother    Breast cancer Mother        mets   Lung cancer Mother    Diabetes Father     Alcohol abuse Sister    Sickle cell trait Sister    Diabetes Brother    Asthma Brother    Lung cancer Maternal Grandmother    Kidney disease Maternal Grandmother    Liver cancer Maternal Uncle        x 4-5   Kidney disease Maternal Uncle    Kidney disease Paternal Uncle    Colon cancer Neg Hx    Colon polyps Neg Hx    Esophageal cancer Neg Hx    Rectal cancer Neg Hx    Stomach cancer Neg Hx     Social History:   Social History   Socioeconomic History   Marital status: Married    Spouse name: [name]   Number of children: 1   Years of education: Not on file   Highest education level: Not on file  Occupational History   Occupation: unemployed  Tobacco Use   Smoking status: Every Day    Packs/day: 0.50    Years: 38.00    Pack years: 19.00    Types: Cigarettes    Start date: 09/14/1982   Smokeless tobacco: Never  Vaping Use   Vaping Use: Never used  Substance and Sexual Activity   Alcohol use: Not Currently    Comment: 2-3 per day, none since 2019   Drug use: Not Currently   Sexual activity: Not on file  Other Topics Concern   Not on file  Social History Narrative   One child, 4 step-kids   Social Determinants of Health   Financial Resource Strain: Not on file  Food Insecurity: Not on file  Transportation Needs: Not on file  Physical Activity: Not on file  Stress: Not on file  Social Connections: Not on file    Additional Social History:   Allergies:   Allergies  Allergen Reactions   Eggs Or Egg-Derived Products Rash   Morphine And Related Other (See Comments)    Cant take because of pancreatitis   Cocoa Rash    Metabolic Disorder Labs: Lab Results  Component Value Date   HGBA1C 7.8 (A) 06/27/2021   MPG 214.47 12/27/2020   MPG 194.38 01/10/2020   No results found for: PROLACTIN Lab Results  Component Value Date   CHOL 86 (L) 10/23/2020   TRIG 87 10/23/2020   HDL 34 (L) 10/23/2020   CHOLHDL 2.5 10/23/2020   LDLCALC 34 10/23/2020   LDLCALC  71 12/12/2019   Lab Results  Component Value Date   TSH 1.430 08/19/2020    Therapeutic Level Labs: No results found for: LITHIUM No results found for: CBMZ No results found for: VALPROATE  Current Medications: Current Outpatient Medications  Medication Sig Dispense Refill   acetaminophen (TYLENOL) 500 MG tablet Take 2 tablets (1,000 mg total)  by mouth every 6 (six) hours. (Patient taking differently: Take 1,000 mg by mouth every 6 (six) hours as needed for moderate pain.) 30 tablet 0   albuterol (VENTOLIN HFA) 108 (90 Base) MCG/ACT inhaler INHALE 2 TO 4 PUFFS INTO THE LUNGS EVERY 4 HOURS AS NEEDED FOR WHEEZING OR COUGH. (Patient taking differently: Inhale 2-4 puffs into the lungs every 4 (four) hours as needed for wheezing or shortness of breath.) 18 g 11   ARIPiprazole (ABILIFY) 5 MG tablet Take 1 tablet (5 mg total) by mouth daily. 90 tablet 2   atorvastatin (LIPITOR) 40 MG tablet Take 1 tablet (40 mg total) by mouth daily. 90 tablet 3   bismuth subsalicylate (PEPTO BISMOL) 262 MG/15ML suspension Take 30 mLs by mouth every 6 (six) hours as needed for indigestion or diarrhea or loose stools.     cetirizine (ZYRTEC) 10 MG tablet Take 1 tablet (10 mg total) by mouth daily. (Patient taking differently: Take 10 mg by mouth daily as needed for allergies.) 30 tablet 11   Continuous Blood Gluc Receiver (DEXCOM G6 RECEIVER) DEVI 1 Device by Does not apply route 4 (four) times daily. 1 each 11   Continuous Blood Gluc Fernandez (DEXCOM G6 Fernandez) MISC USE AS DIRECTED 4 TIMES DAILY 3 each 11   Continuous Blood Gluc Transmit (DEXCOM G6 TRANSMITTER) MISC 4 (four) times daily. as directed     Continuous Blood Gluc Transmit (DEXCOM G6 TRANSMITTER) MISC USE AS DIRECTED FOUR TIMES DAILY 1 each 11   cyclobenzaprine (FLEXERIL) 10 MG tablet Take 1 tablet (10 mg total) by mouth 3 (three) times daily. 90 tablet 2   doxycycline (VIBRA-TABS) 100 MG tablet Take 1 tablet (100 mg total) by mouth 2 (two) times daily.  60 tablet 11   DULoxetine (CYMBALTA) 60 MG capsule TAKE 1 CAPSULE (60 MG TOTAL) BY MOUTH DAILY. (Patient taking differently: Take 60 mg by mouth daily.) 90 capsule 2   EASY TOUCH PEN NEEDLES 31G X 5 MM MISC Inject 1 application as directed 3 (three) times daily. 100 each 11   empagliflozin (JARDIANCE) 25 MG TABS tablet TAKE 1 TABLET BY MOUTH DAILY. (Patient taking differently: Take 25 mg by mouth daily.) 90 tablet 3   fluticasone-salmeterol (ADVAIR) 250-50 MCG/ACT AEPB Inhale 1 puff into the lungs in the morning and at bedtime. 60 each 2   gabapentin (NEURONTIN) 300 MG capsule Take 3 capsules (900 mg total) by mouth 3 (three) times daily. 270 capsule 2   ibuprofen (ADVIL) 400 MG tablet Take 1 tablet (400 mg total) by mouth every 8 (eight) hours as needed for mild pain. 60 tablet 0   insulin glargine-yfgn (SEMGLEE, YFGN,) 100 UNIT/ML Pen Inject 10 Units into the skin daily. 15 mL 1   insulin lispro (HUMALOG KWIKPEN) 100 UNIT/ML KwikPen Inject 6-10 Units into the skin 3 (three) times daily. 15 mL 11   Lancets (FREESTYLE) lancets Use as instructed 100 each 12   metFORMIN (GLUCOPHAGE) 1000 MG tablet Take 1 tablet (1,000 mg total) by mouth 2 (two) times daily with a meal. 180 tablet 3   metoprolol succinate (TOPROL-XL) 100 MG 24 hr tablet Take 1.5 tablets (150 mg total) by mouth at bedtime. Take with or immediately following a meal. 120 tablet 3   mirtazapine (REMERON) 30 MG tablet TAKE 1 TABLET (30 MG TOTAL) BY MOUTH AT BEDTIME. (Patient taking differently: Take 30 mg by mouth at bedtime.) 90 tablet 3   montelukast (SINGULAIR) 10 MG tablet TAKE 1 TABLET (10 MG TOTAL) BY  MOUTH AT BEDTIME. (Patient taking differently: Take 10 mg by mouth in the morning.) 90 tablet 11   omeprazole (PRILOSEC) 40 MG capsule Take 1 capsule (40 mg total) by mouth daily. 30 capsule 3   rifampin (RIFADIN) 300 MG capsule Take 1 capsule (300 mg total) by mouth 2 (two) times daily. 60 capsule 11   sitaGLIPtin (JANUVIA) 100 MG tablet  TAKE 1 TABLET (100 MG TOTAL) BY MOUTH DAILY. (Patient taking differently: Take 100 mg by mouth daily.) 90 tablet 10   Current Facility-Administered Medications  Medication Dose Route Frequency Provider Last Rate Last Admin   0.9 %  sodium chloride infusion  500 mL Intravenous Once Mansouraty, Telford Nab., MD        Musculoskeletal:  Psychiatric Specialty Exam: Review of Systems  There were no vitals taken for this visit.There is no height or weight on file to calculate BMI.  General Appearance: Casual  Eye Contact:  Good  Speech:  Clear and Coherent  Volume:  Normal  Mood:  Anxious and Depressed  Affect:  Congruent  Thought Process:  Coherent  Orientation:  Full (Time, Place, and Person)  Thought Content:  Logical  Suicidal Thoughts:  No  Homicidal Thoughts:  No  Memory:  Immediate;   Good Recent;   Good Remote;   Good  Judgement:  Good  Insight:  Good  Psychomotor Activity:  Normal  Concentration:  Concentration: Good  Recall:  Good  Fund of Knowledge:Good  Language: Good  Akathisia:  No  Handed:  Right  AIMS (if indicated):  done  Assets:  Communication Skills Desire for Improvement Resilience Social Support  ADL's:  Intact  Cognition: WNL  Sleep:  Good   Screenings: PHQ2-9    Flowsheet Row Counselor from 10/09/2021 in [place] Counselor from 10/02/2021 in [place] Office Visit from 06/27/2021 in [place] Office Visit from 04/01/2021 in [place] Office Visit from 03/28/2021 in [facility] for Infectious Disease  PHQ-2 Total Score 5 5 3  0 0  PHQ-9 Total Score 15 17 6 3  --      Flowsheet Row Counselor from 10/09/2021 in [place] from 10/02/2021 in [place] Admission (Discharged) from 02/24/2021 in [place] Error: Question 6 not  populated No Risk No Risk       Assessment and Plan:  [place] to start Intensive Outpatient programming -Patient may benefit from dual diagnosis program for substance and mood disorder.  Initiated hydroxyzine for reported sleep issues.  Treatment plan was reviewed and agreed upon by NP T.Bobby Rumpf and patient [place] need for group services  Derrill Center, NP 1/27/202311:09 AM

## 2021-10-10 NOTE — Progress Notes (Signed)
Virtual Visit via Video Note   I connected with Christopher Fernandez on 10/10/21 at  9:00 AM EDT by a video enabled telemedicine application and verified that I am speaking with the correct person using two identifiers.   At orientation to the IOP program, Case Manager discussed the limitations of evaluation and management by telemedicine and the availability of in person appointments. The patient expressed understanding and agreed to proceed with virtual visits throughout the duration of the program.   Location:  Patient: Patient Home Provider: [facility] [place] [facility]   History of Present Illness: MDD with anxious distress    Observations/Objective: Check In: Case Manager checked in with all participants to review discharge dates, insurance authorizations, work-related documents and needs from the treatment team regarding medications. Christopher Fernandez stated needs and engaged in discussion.    Initial Therapeutic Activity: Counselor facilitated a check-in with Christopher Fernandez to assess for safety, sobriety and medication compliance.  Counselor also inquired about Christopher Fernandez's current emotional ratings, as well as any significant changes in thoughts, feelings or behavior since previous check in.  Christopher Fernandez presented for session on time and was alert, oriented x5, with no evidence or self-report of active SI/HI or A/V H.  Christopher Fernandez reported compliance with medication and denied use of alcohol or illicit substances.  Christopher Fernandez reported scores of 4/10 for depression, 10/10 for anxiety, and 6/10 for anger/irritability.  Christopher Fernandez denied any recent outbursts or panic attacks.  Christopher Fernandez reported that a recent success was enjoying his first group session yesterday, stating I think this is a good fit so far.  I definitely want to focus on my mental health challenges because I've gone to a bunch of rehabs over the years, but my mental health was something we never really tackled.  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  Christopher Fernandez reported that a current struggle is dealing with anxious thoughts throughout the day.  Christopher Fernandez reported that his goal this afternoon is to do some shopping with his wife.         Second Therapeutic Activity: Counselor discussed topic of gratitude journaling with members as a form of self-care.  Counselor virtually shared a handout with the group today which explained the benefits of this practice, including reduction in stress, increased happiness, and self-esteem.  Tips were also provided to aid in practice, such as taking time with entries, writing about people one is grateful for, and setting goal for two entries per week for at least 10-20 minutes at a time.  Counselor also provided group members with a variety of journaling prompts to choose from today, and encouraged each member to take time to write about something they are grateful for, with examples such as Something beautiful I recently saw was., Something I can be proud of is, A reason to be excited for the future is and more.  Members were encouraged to share their entry with the group, along with their perspective on the activity and motivation level towards making this a habit.  Intervention was effective, as evidenced by Christopher Fernandez participating in journaling activity, and choosing the prompts A reason to be excited for the future is and An act of kindness that I received was.  Christopher Fernandez expressed gratitude for being more optimistic about the future, stating There have been a number of things turning around in my life recently.  He reported that this included reconnecting with family members he lost touch with, which has led to increased social support.  Christopher Fernandez reported that he was also grateful for a family member gifting him a car unexpectedly to help with transportation issues.  Christopher Fernandez reported that he enjoyed the activity and would consider making this a new habit along with regular  prayer.      Third Therapeutic Activity: Psycho-educational portion of group was provided by Christie Beckers, Mudlogger of community education with [employer].  [name] provided information on history of her local agency, mission statement, and the variety of unique services offered which group members might find beneficial to engage in, including both virtual and in-person support groups, as well as peer support program for mentoring.  [name] offered time to answer member's questions regarding services and encouraged them to consider utilizing these services to assist in working towards their individual wellness goals.  Intervention was effective, as evidenced by Christopher Fernandez reporting that he wrote down several group topics that were mentioned, including self-esteem and peer support.  He reported that he would plan to follow up for services with [facility] to increase support following discharge from [facility].    Assessment and Plan: Counselor recommends that [facility] remain in IOP treatment to better manage mental health symptoms, ensure stability and pursue completion of treatment plan goals. Counselor recommends adherence to crisis/safety plan, taking medications as prescribed, and following up with medical professionals if any issues arise.   Follow Up Instructions: Counselor will send Webex link for next session. Christopher Fernandez was advised to call back or seek an in-person evaluation if the symptoms worsen or if the condition fails to improve as anticipated.   I provided 180 minutes of non-face-to-face time during this encounter.   [name], [place], LCAS 10/10/21

## 2021-10-13 ENCOUNTER — Ambulatory Visit: Payer: 59 | Admitting: Family Medicine

## 2021-10-13 ENCOUNTER — Other Ambulatory Visit: Payer: Self-pay

## 2021-10-13 ENCOUNTER — Other Ambulatory Visit (HOSPITAL_COMMUNITY): Payer: 59 | Admitting: Psychiatry

## 2021-10-13 DIAGNOSIS — F329 Major depressive disorder, single episode, unspecified: Secondary | ICD-10-CM

## 2021-10-13 DIAGNOSIS — F419 Anxiety disorder, unspecified: Secondary | ICD-10-CM | POA: Diagnosis not present

## 2021-10-13 DIAGNOSIS — Z560 Unemployment, unspecified: Secondary | ICD-10-CM | POA: Diagnosis not present

## 2021-10-13 DIAGNOSIS — F1911 Other psychoactive substance abuse, in remission: Secondary | ICD-10-CM | POA: Diagnosis not present

## 2021-10-13 DIAGNOSIS — F17210 Nicotine dependence, cigarettes, uncomplicated: Secondary | ICD-10-CM | POA: Diagnosis not present

## 2021-10-13 DIAGNOSIS — G479 Sleep disorder, unspecified: Secondary | ICD-10-CM | POA: Diagnosis not present

## 2021-10-13 DIAGNOSIS — Z8616 Personal history of COVID-19: Secondary | ICD-10-CM | POA: Diagnosis not present

## 2021-10-13 DIAGNOSIS — Z658 Other specified problems related to psychosocial circumstances: Secondary | ICD-10-CM | POA: Diagnosis not present

## 2021-10-13 NOTE — Progress Notes (Signed)
Virtual Visit via Video Note   I connected with [name] on 10/13/21 at  9:00 AM EDT by a video enabled telemedicine application and verified that I am speaking with the correct person using two identifiers.   At orientation to the IOP program, Case Manager discussed the limitations of evaluation and management by telemedicine and the availability of in person appointments. The patient expressed understanding and agreed to proceed with virtual visits throughout the duration of the program.   Location:  Patient: Patient Home Provider: Counselor Home Office   History of Present Illness: MDD with anxious distress    Observations/Objective: Check In: Case Manager checked in with all participants to review discharge dates, insurance authorizations, work-related documents and needs from the treatment team regarding medications. Travarus stated needs and engaged in discussion.    Initial Therapeutic Activity: Counselor facilitated a check-in with Evin to assess for safety, sobriety and medication compliance.  Counselor also inquired about Jayvier's current emotional ratings, as well as any significant changes in thoughts, feelings or behavior since previous check in.  Johathon presented for session on time and was alert, oriented x5, with no evidence or self-report of active SI/HI or A/V H.  Lorry reported compliance with medication and denied use of alcohol or illicit substances.  Rama reported scores of 8/10 for depression, 9/10 for anxiety, and 5/10 for anger/irritability.  Yoni denied any recent outbursts or panic attacks.  Jomes reported that a recent success was reading the bible over the weekend to strengthen his faith.  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       Second Therapeutic Activity: Counselor utilized a Radio broadcast assistant with group members today to guide discussion on topic of  codependency.  This handout defined codependency as excessive emotional or psychological reliance upon someone who requires support on account of an illness or addiction.  It also explained how this issue presents in dysfunctional family systems, including behavior such as denying existence of problems, rigid boundaries on communication, strained trust, lack of individuality, and reinforcement of unhealthy coping mechanisms such as substance use.  Characteristics of co-dependent people were listed for assistance with identification, such as extreme need for approval/recognition, difficulty identifying feelings, poor communication, and more.  Members were also tasked with completing a questionnaire in order to identify signs of codependency and results were discussed afterward.  This handout also offered strategies for resolving co-dependency within one's network, including increased use of assertive communication skills in order to set appropriate boundaries.  Intervention was effective, as evidenced by [name] actively participating in discussion on the subject, and completing codependency questionnaire, with 7 out of 20 positive responses.  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  Amarion reported that he struggle with chronic anger, guilt when asserting himself, identifying feelings, and has relied on substances in the past to cope with difficult feelings.  Naseem stated I tend to turn my anger inward at myself.  Zykee reported that his goal is to begin asserting himself more confidently when issues arise in the marriage, and set healthier boundaries to resolve this ongoing issue, as well as continue recommending marriage counseling for additional support and guidance.    Assessment and Plan: Counselor recommends that [facility] remain in IOP treatment to better manage  mental health symptoms, ensure stability and  pursue completion of treatment plan goals. Counselor recommends adherence to crisis/safety plan, taking medications as prescribed, and following up with medical professionals if any issues arise.   Follow Up Instructions: Counselor will send Webex link for next session. Latasha was advised to call back or seek an in-person evaluation if the symptoms worsen or if the condition fails to improve as anticipated.   I provided 180 minutes of non-face-to-face time during this encounter.   [name], [place], LCAS 10/13/21

## 2021-10-13 NOTE — Plan of Care (Signed)
Pt will improve his mood as evidenced by being happy again, managing his mood and coping with daily stressors for 5 out of 7 days for 60 days.

## 2021-10-14 ENCOUNTER — Other Ambulatory Visit (HOSPITAL_COMMUNITY): Payer: 59 | Admitting: Licensed Clinical Social Worker

## 2021-10-14 ENCOUNTER — Other Ambulatory Visit: Payer: Self-pay

## 2021-10-14 ENCOUNTER — Encounter (HOSPITAL_COMMUNITY): Payer: Self-pay | Admitting: Family

## 2021-10-14 ENCOUNTER — Other Ambulatory Visit (HOSPITAL_COMMUNITY): Payer: Self-pay

## 2021-10-14 ENCOUNTER — Ambulatory Visit: Payer: 59 | Admitting: Infectious Disease

## 2021-10-14 VITALS — BP 113/79 | HR 72 | Ht 75.0 in | Wt 222.0 lb

## 2021-10-14 DIAGNOSIS — M4620 Osteomyelitis of vertebra, site unspecified: Secondary | ICD-10-CM

## 2021-10-14 DIAGNOSIS — Z9889 Other specified postprocedural states: Secondary | ICD-10-CM | POA: Diagnosis not present

## 2021-10-14 DIAGNOSIS — Z89431 Acquired absence of right foot: Secondary | ICD-10-CM | POA: Diagnosis not present

## 2021-10-14 DIAGNOSIS — A4902 Methicillin resistant Staphylococcus aureus infection, unspecified site: Secondary | ICD-10-CM | POA: Diagnosis not present

## 2021-10-14 DIAGNOSIS — Q8901 Asplenia (congenital): Secondary | ICD-10-CM | POA: Diagnosis not present

## 2021-10-14 DIAGNOSIS — E1169 Type 2 diabetes mellitus with other specified complication: Secondary | ICD-10-CM | POA: Diagnosis not present

## 2021-10-14 DIAGNOSIS — F1911 Other psychoactive substance abuse, in remission: Secondary | ICD-10-CM | POA: Diagnosis not present

## 2021-10-14 DIAGNOSIS — T847XXD Infection and inflammatory reaction due to other internal orthopedic prosthetic devices, implants and grafts, subsequent encounter: Secondary | ICD-10-CM | POA: Diagnosis not present

## 2021-10-14 DIAGNOSIS — M869 Osteomyelitis, unspecified: Secondary | ICD-10-CM

## 2021-10-14 DIAGNOSIS — F17210 Nicotine dependence, cigarettes, uncomplicated: Secondary | ICD-10-CM | POA: Diagnosis not present

## 2021-10-14 DIAGNOSIS — E1140 Type 2 diabetes mellitus with diabetic neuropathy, unspecified: Secondary | ICD-10-CM

## 2021-10-14 DIAGNOSIS — Z658 Other specified problems related to psychosocial circumstances: Secondary | ICD-10-CM | POA: Diagnosis not present

## 2021-10-14 DIAGNOSIS — Z794 Long term (current) use of insulin: Secondary | ICD-10-CM

## 2021-10-14 DIAGNOSIS — M86151 Other acute osteomyelitis, right femur: Secondary | ICD-10-CM | POA: Diagnosis not present

## 2021-10-14 DIAGNOSIS — F329 Major depressive disorder, single episode, unspecified: Secondary | ICD-10-CM | POA: Diagnosis not present

## 2021-10-14 DIAGNOSIS — G479 Sleep disorder, unspecified: Secondary | ICD-10-CM | POA: Diagnosis not present

## 2021-10-14 DIAGNOSIS — Z560 Unemployment, unspecified: Secondary | ICD-10-CM | POA: Diagnosis not present

## 2021-10-14 DIAGNOSIS — Z23 Encounter for immunization: Secondary | ICD-10-CM

## 2021-10-14 DIAGNOSIS — F419 Anxiety disorder, unspecified: Secondary | ICD-10-CM | POA: Diagnosis not present

## 2021-10-14 DIAGNOSIS — Z8616 Personal history of COVID-19: Secondary | ICD-10-CM | POA: Diagnosis not present

## 2021-10-14 MED ORDER — RIFAMPIN 300 MG PO CAPS
300.0000 mg | ORAL_CAPSULE | Freq: Two times a day (BID) | ORAL | 11 refills | Status: DC
  Filled 2021-10-14 – 2021-11-12 (×2): qty 60, 30d supply, fill #0
  Filled 2021-12-16: qty 60, 30d supply, fill #1

## 2021-10-14 MED ORDER — DOXYCYCLINE HYCLATE 100 MG PO TABS
100.0000 mg | ORAL_TABLET | Freq: Two times a day (BID) | ORAL | 11 refills | Status: DC
  Filled 2021-10-14 – 2021-11-12 (×2): qty 60, 30d supply, fill #0
  Filled 2021-12-16: qty 60, 30d supply, fill #1

## 2021-10-14 NOTE — Progress Notes (Signed)
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   MRN: [id number]  HPI  51 y.o. 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   He ultimately went to the operating room on April 22 and underwent vertebrectomy, and decompression of his epidural abscess with hardware reconstruction joint neurosurgical and cardiothoracic surgical involvement.   He then had to go back to the operating room on the 29th for redo thoracotomy due to an infected empyema.  Chest tubes were placed.   Unfortunately his MRSA isolate from the operating room was now found to be daptomycin resistant though still sensitive to vancomycin he was switched over to vancomycin along with rifampin given the hardware.   Chest tubes were removed towards the end of his hospital stay.  He now is actually complaining of some swelling and discomfort along the site where his chest tube was placed.  He has had deterioration of his third toe and going to undergo amputation with vascular surgery with Dr. Carlis Abbott on Monday.  We did obtain a repeat MRI of his pelvis due to the concerns he was still having pain in his thigh.  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  His wife had tested positive for COVID-19 and he himself again did. We prescribed him moluprinavir.  Said that he felt dramatically better after starting the  moluprinavir and that he only had a lingering cough for a little while afterwards.  In the interim he has had partial third toe amputation performed by Dr. Carlis Abbott with vascular surgery.  He continues on doxycycline and rifampin.   When I saw Christopher Fernandez in early December he had had onset 3 weeks ago of mid back pain that radiated bilaterally to his flanks.  He initially thought the pain was due to muscle spasm but then it persisted and was worrying him.  He reached out to Dr. Clarice Pole office but was not able to see him.  He was concerned that this pain was resembling what he experienced when he had worsening of his vertebral infection.   You are able to obtain MRI of the l lumbar and thoracic spine on December 1922.  Fortunately it showed encouraging appearance of thoracic spine status post corpectomy with fusion from T8-T11 a wide patency of the canal and foramina without evidence of complications or infection.   Lumbar spine also did not show significant lumbar spine MRI was also encouraging just showing some not mild noncompressive disc bulges.  Patient's pain then improved.  He says occasionally will have pain again in his back but it tends to subside again.  His amputation sites in his feet are doing well.       :   Past Medical History:  Diagnosis Date   Alcoholism ([place])    Allergy    Anemia    Anxiety    Arthritis    knees, hands, lower back   Asthma    Blood transfusion without reported diagnosis    COPD (chronic  obstructive pulmonary disease) ([place])    COVID-19 in immunocompromised patient ([place]) 02/21/2021   Depression    Diabetes ([place])    type 2   GERD (gastroesophageal reflux disease)    History of hiatal hernia    HTN (hypertension)    Mass of right chest wall 02/21/2021   MRSA bacteremia    MRSA infection 01/11/2020   Neuromuscular disorder ([place])    tremors   Osteomyelitis ([facility])    right forefoot   Pancreatitis    Pneumonia    Substance abuse ([place])     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   Wears glasses     Past Surgical History:  Procedure Laterality Date   Amputation Right    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   AMPUTATION Right 10/18/2019   Procedure: RIGHT SECOND TOE AMPUTATION;  Surgeon: Newt Minion, MD;  Location: [place];  Service: Orthopedics;  Laterality: Right;   AMPUTATION Right 11/08/2019   Procedure: RIGHT TRANSMETATARSAL AMPUTATION;  Surgeon: Newt Minion, MD;  Location: [place];  Service: Orthopedics;  Laterality: Right;   AMPUTATION Left 12/02/2020   Procedure: AMPUTATION 2ND TOE;  Surgeon: Elam Dutch, MD;  Location: [place];  Service: Vascular;  Laterality: Left;   AMPUTATION Left 02/24/2021   Procedure: PARTIAL LEFT THIRD TOE AMPUTATION;  Surgeon: [name], MD;  Location: [place];  Service: Vascular;  Laterality: Left;   APPLICATION OF WOUND VAC Right 01/10/2020   Procedure: APPLICATION OF WOUND VAC, right foot;  Surgeon: [name], MD;  Location: [place];  Service: Vascular;  Laterality: Right;   BILIARY DILATION  12/06/2020   Procedure: BILIARY DILATION;  Surgeon: Irving Copas., MD;  Location: [place];  Service: Gastroenterology;;   BIOPSY  12/06/2020   Procedure: BIOPSY;  Surgeon: Irving Copas., MD;  Location: [place];  Service: Gastroenterology;;   CHOLECYSTECTOMY  2006   with gallbladder and spleen   COLONOSCOPY  8-10 years ago    in [place] exam per pt   ENDOSCOPIC RETROGRADE CHOLANGIOPANCREATOGRAPHY (ERCP) WITH PROPOFOL N/A 12/06/2020   Procedure: ENDOSCOPIC RETROGRADE CHOLANGIOPANCREATOGRAPHY (ERCP) WITH PROPOFOL;  Surgeon: Irving Copas., MD;  Location: [place];  Service: Gastroenterology;  Laterality: N/A;   HERNIA REPAIR  2008, 2010   hiatal hernia and 1 additional   NASAL SEPTUM SURGERY     PANCREATIC PSEUDOCYST DRAINAGE     PANCREATIC STENT PLACEMENT  12/06/2020   Procedure: PANCREATIC STENT PLACEMENT;  Surgeon: Irving Copas.,  MD;  Location: [place];  Service: Gastroenterology;;   PLEURAL EFFUSION DRAINAGE Right 01/10/2021   Procedure: DRAINAGE OF PLEURAL EFFUSION;  Surgeon: Gaye Pollack, MD;  Location: [place];  Service: Thoracic;  Laterality: Right;   REMOVAL OF STONES  12/06/2020   Procedure: REMOVAL OF STONES;  Surgeon: Irving Copas., MD;  Location: [place];  Service: Gastroenterology;;   [name]  12/06/2020   Procedure: [name];  Surgeon: Irving Copas., MD;  Location: [place];  Service: Gastroenterology;;   SPLENECTOMY  2006   STUMP REVISION Right 11/24/2019   Procedure: REVISION RIGHT TRANSMETATARSAL AMPUTATION;  Surgeon: Newt Minion, MD;  Location: [place];  Service: Orthopedics;  Laterality: Right;   TEE WITHOUT CARDIOVERSION N/A 12/04/2020   Procedure: TRANSESOPHAGEAL ECHOCARDIOGRAM (TEE);  Surgeon: Acie Fredrickson Wonda Cheng, MD;  Location: [facility] ENDOSCOPY;  Service: Cardiovascular;  Laterality: N/A;   THORACIC EXPOSURE Right 01/03/2021   Procedure: THORACIC EXPOSURE with removal of rib;  Surgeon: Gaye Pollack, MD;  [facility];  Service: Cardiothoracic;  Laterality: Right;   THORACOTOMY Right 01/10/2021   Procedure: REDO THORACOTOMY;  Surgeon: Gaye Pollack, MD;  Location: [facility];  Service: Thoracic;  Laterality: Right;   Comfort VERTEBRECTOMY Right 01/03/2021   Procedure: Decompression of  Thoracic nine and Thoracic ten via corpectomy reconstruction with titanium spacer and rib autogrtaft. Lateral plate fixation Thoracic eight - Thoracic eleven;  Surgeon: Kristeen Miss, MD;  Location: [place];  Service: Neurosurgery;  Laterality: Right;   TOTAL KNEE ARTHROPLASTY Right 1982, 1984   TRANSMETATARSAL AMPUTATION Left 09/06/2020   Procedure: AMPUTATION LEFT GREAT TOE;  Surgeon: Waynetta Sandy, MD;  Location: [place];  Service: Vascular;  Laterality: Left;   WOUND DEBRIDEMENT Right 01/10/2020   Procedure: incisional DEBRIDEMENT of RIGHT TRANSMETATARSAL WOUND;   Surgeon: [name], MD;  Location: [facility];  Service: Vascular;  Laterality: Right;    Family History  Problem Relation Age of Onset   Diabetes Mother    Hypertension Mother    Hyperlipidemia Mother    Kidney disease Mother    Thyroid disease Mother    Breast cancer Mother        mets   Lung cancer Mother    Diabetes Father    Alcohol abuse Sister    Sickle cell trait Sister    Diabetes Brother    Asthma Brother    Lung cancer Maternal Grandmother    Kidney disease Maternal Grandmother    Liver cancer Maternal Uncle        x 4-5   Kidney disease Maternal Uncle    Kidney disease Paternal Uncle    Colon cancer Neg Hx    Colon polyps Neg Hx    Esophageal cancer Neg Hx    Rectal cancer Neg Hx    Stomach cancer Neg Hx       Social History   Socioeconomic History   Marital status: Married    Spouse name: [name]   Number of children: 1   Years of education: Not on file   Highest education level: Not on file  Occupational History   Occupation: unemployed  Tobacco Use   Smoking status: Every Day    Packs/day: 0.50    Years: 38.00    Pack years: 19.00    Types: Cigarettes    Start date: 09/14/1982   Smokeless tobacco: Never  Vaping Use   Vaping Use: Never used  Substance and Sexual Activity   Alcohol use: Not Currently    Comment: 2-3 per day, none since 2019   Drug use: Not Currently   Sexual activity: Not on file  Other Topics Concern   Not on file  Social History Narrative   One child, 4 step-kids   Social Determinants of Health   Financial Resource Strain: Not on file  Food Insecurity: Not on file  Transportation Needs: Not on file  Physical Activity: Not on file  Stress: Not on file  Social Connections: Not on file    Allergies  Allergen Reactions   Eggs Or Egg-Derived Products Rash   Morphine And Related Other (See Comments)    Cant take because of pancreatitis   Cocoa Rash     Current Outpatient Medications:    acetaminophen  (TYLENOL) 500 MG tablet, Take 2 tablets (1,000 mg total) by mouth every 6 (six) hours. (Patient taking differently: Take 1,000 mg by mouth every 6 (six) hours as needed for moderate pain.), Disp: 30 tablet, Rfl: 0   albuterol (  VENTOLIN HFA) 108 (90 Base) MCG/ACT inhaler, INHALE 2 TO 4 PUFFS INTO THE LUNGS EVERY 4 HOURS AS NEEDED FOR WHEEZING OR COUGH. (Patient taking differently: Inhale 2-4 puffs into the lungs every 4 (four) hours as needed for wheezing or shortness of breath.), Disp: 18 g, Rfl: 11   ARIPiprazole (ABILIFY) 5 MG tablet, Take 1 tablet (5 mg total) by mouth daily., Disp: 90 tablet, Rfl: 2   atorvastatin (LIPITOR) 40 MG tablet, Take 1 tablet (40 mg total) by mouth daily., Disp: 90 tablet, Rfl: 3   bismuth subsalicylate (PEPTO BISMOL) 262 MG/15ML suspension, Take 30 mLs by mouth every 6 (six) hours as needed for indigestion or diarrhea or loose stools., Disp: , Rfl:    cetirizine (ZYRTEC) 10 MG tablet, Take 1 tablet (10 mg total) by mouth daily. (Patient taking differently: Take 10 mg by mouth daily as needed for allergies.), Disp: 30 tablet, Rfl: 11   Continuous Blood Gluc Receiver (DEXCOM G6 RECEIVER) DEVI, 1 Device by Does not apply route 4 (four) times daily., Disp: 1 each, Rfl: 11   Continuous Blood Gluc Sensor (DEXCOM G6 SENSOR) MISC, USE AS DIRECTED 4 TIMES DAILY, Disp: 3 each, Rfl: 11   Continuous Blood Gluc Transmit (DEXCOM G6 TRANSMITTER) MISC, 4 (four) times daily. as directed, Disp: , Rfl:    Continuous Blood Gluc Transmit (DEXCOM G6 TRANSMITTER) MISC, USE AS DIRECTED FOUR TIMES DAILY, Disp: 1 each, Rfl: 11   cyclobenzaprine (FLEXERIL) 10 MG tablet, Take 1 tablet (10 mg total) by mouth 3 (three) times daily., Disp: 90 tablet, Rfl: 2   doxycycline (VIBRA-TABS) 100 MG tablet, Take 1 tablet (100 mg total) by mouth 2 (two) times daily., Disp: 60 tablet, Rfl: 11   DULoxetine (CYMBALTA) 60 MG capsule, TAKE 1 CAPSULE (60 MG TOTAL) BY MOUTH DAILY. (Patient taking differently: Take 60 mg  by mouth daily.), Disp: 90 capsule, Rfl: 2   EASY TOUCH PEN NEEDLES 31G X 5 MM MISC, Inject 1 application as directed 3 (three) times daily., Disp: 100 each, Rfl: 11   empagliflozin (JARDIANCE) 25 MG TABS tablet, TAKE 1 TABLET BY MOUTH DAILY. (Patient taking differently: Take 25 mg by mouth daily.), Disp: 90 tablet, Rfl: 3   fluticasone-salmeterol (ADVAIR) 250-50 MCG/ACT AEPB, Inhale 1 puff into the lungs in the morning and at bedtime., Disp: 60 each, Rfl: 2   gabapentin (NEURONTIN) 300 MG capsule, Take 3 capsules (900 mg total) by mouth 3 (three) times daily., Disp: 270 capsule, Rfl: 2   hydrOXYzine (VISTARIL) 25 MG capsule, Take 1 capsule (25 mg total) by mouth at bedtime as needed for anxiety., Disp: 30 capsule, Rfl: 0   ibuprofen (ADVIL) 400 MG tablet, Take 1 tablet (400 mg total) by mouth every 8 (eight) hours as needed for mild pain., Disp: 60 tablet, Rfl: 0   insulin glargine-yfgn (SEMGLEE, YFGN,) 100 UNIT/ML Pen, Inject 10 Units into the skin daily., Disp: 15 mL, Rfl: 1   insulin lispro (HUMALOG KWIKPEN) 100 UNIT/ML KwikPen, Inject 6-10 Units into the skin 3 (three) times daily., Disp: 15 mL, Rfl: 11   Lancets (FREESTYLE) lancets, Use as instructed, Disp: 100 each, Rfl: 12   metFORMIN (GLUCOPHAGE) 1000 MG tablet, Take 1 tablet (1,000 mg total) by mouth 2 (two) times daily with a meal., Disp: 180 tablet, Rfl: 3   metoprolol succinate (TOPROL-XL) 100 MG 24 hr tablet, Take 1.5 tablets (150 mg total) by mouth at bedtime. Take with or immediately following a meal., Disp: 120 tablet, Rfl: 3   mirtazapine (  REMERON) 30 MG tablet, TAKE 1 TABLET (30 MG TOTAL) BY MOUTH AT BEDTIME. (Patient taking differently: Take 30 mg by mouth at bedtime.), Disp: 90 tablet, Rfl: 3   montelukast (SINGULAIR) 10 MG tablet, TAKE 1 TABLET (10 MG TOTAL) BY MOUTH AT BEDTIME. (Patient taking differently: Take 10 mg by mouth in the morning.), Disp: 90 tablet, Rfl: 11   omeprazole (PRILOSEC) 40 MG capsule, Take 1 capsule (40 mg  total) by mouth daily., Disp: 30 capsule, Rfl: 3   rifampin (RIFADIN) 300 MG capsule, Take 1 capsule (300 mg total) by mouth 2 (two) times daily., Disp: 60 capsule, Rfl: 11   sitaGLIPtin (JANUVIA) 100 MG tablet, TAKE 1 TABLET (100 MG TOTAL) BY MOUTH DAILY. (Patient taking differently: Take 100 mg by mouth daily.), Disp: 90 tablet, Rfl: 10  Current Facility-Administered Medications:    0.9 %  sodium chloride infusion, 500 mL, Intravenous, Once, Mansouraty, Telford Nab., MD   Review of Systems     Objective:   Physical Exam Constitutional:      General: He is not in acute distress.    Appearance: Normal appearance. He is well-developed. He is not ill-appearing or diaphoretic.  HENT:     Head: Normocephalic and atraumatic.     Right Ear: Hearing and external ear normal.     Left Ear: Hearing and external ear normal.     Nose: No nasal deformity or rhinorrhea.  Eyes:     General: No scleral icterus.    Conjunctiva/sclera: Conjunctivae normal.     Right eye: Right conjunctiva is not injected.     Left eye: Left conjunctiva is not injected.     Pupils: Pupils are equal, round, and reactive to light.  Neck:     Vascular: No JVD.  Cardiovascular:     Rate and Rhythm: Normal rate and regular rhythm.     Heart sounds: S1 normal and S2 normal.  Pulmonary:     Effort: Pulmonary effort is normal. No respiratory distress.     Breath sounds: No wheezing.  Abdominal:     Palpations: Abdomen is soft.     Tenderness: There is no abdominal tenderness.  Musculoskeletal:        General: Normal range of motion.     Right shoulder: Normal.     Left shoulder: Normal.     Cervical back: Normal range of motion and neck supple.     Right hip: Normal.     Left hip: Normal.     Right knee: Normal.     Left knee: Normal.  Lymphadenopathy:     Head:     Right side of head: No submandibular, preauricular or posterior auricular adenopathy.     Left side of head: No submandibular, preauricular or  posterior auricular adenopathy.     Cervical: No cervical adenopathy.     Right cervical: No superficial or deep cervical adenopathy.    Left cervical: No superficial or deep cervical adenopathy.  Skin:    General: Skin is warm and dry.     Coloration: Skin is not pale.     Findings: No abrasion, bruising, ecchymosis, erythema, lesion or rash.     Nails: There is no clubbing.  Neurological:     General: No focal deficit present.     Mental Status: He is alert and oriented to person, place, and time.     Gait: Gait normal.  Psychiatric:        Attention and Perception: He is attentive.  Mood and Affect: Mood normal.        Speech: Speech normal.        Behavior: Behavior normal. Behavior is cooperative.        Thought Content: Thought content normal.        Judgment: Judgment normal.     Left foot 02/21/2021:       03/28/2021      Left foot October 14, 2021:     Right foot October 14, 2021:   And he did have acute worsening of his back pain in November or December fortunately MRI was quite encouraging.       Assessment & Plan:   51 y.o. 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   He ultimately went to the operating room on April 22 and underwent vertebrectomy, and decompression of his epidural abscess with hardware reconstruction joint neurosurgical and cardiothoracic surgical involvement.   He then had to go back to the operating room on the 29th for redo thoracotomy due to an infected empyema.  Chest tubes were placed.    Unfortunately his MRSA isolate from the operating room was now found to be daptomycin resistant though still sensitive to vancomycin he was switched over to vancomycin along with rifampin given the hardware.   Chest tubes were removed towards the end of his hospital stay.  He also previously been found to have osteomyelitis of the pubic symphysis.  I repeated an MRI of this area because he had some discomfort in his leg and sensation in the muscle.  MRI showed improvement of the myositis as well as the osteomyelitis at the site.  Now undergone resection of third toe  Maxamilian has now acute worsening of back pain which is highly concerning given the fact that he had to have a surgery there with vertebrectomy  He occasionally has back pain but then tends to improve his pain is completely gone from his pelvis.  His feet appear to be doing well also  I will check sed rate CRP CBC with differential CMP and will continue doxycycline and rifampin indefinitely.   Asplenia: He has received 2 meningococcal vaccines in November and December.  He does need a meningitis B vaccine series completed.  He also will later in the next 5 years need Menactra since he will be over the age of 24.  We did give him Prevnar No. 20 today.  Diabetes mellitus will follow with primary care is continue Jardiance metformin insulin.  Hyperlipidemia we will continue on his that  I spent 841 minutes with the patient including than 50% of the time in face to face counseling of the patient already has metastatic MRSA infection asplenia vaccine counseling personally reviewing RI of the thoracic and lumbar spine and performed in early December 2022 along with review of medical records in preparation for the visit and during the visit and in coordination of his care.

## 2021-10-14 NOTE — Progress Notes (Signed)
Virtual Visit via Video Note   I connected with [name] on 10/14/21 at  9:00 AM EDT by a video enabled telemedicine application and verified that I am speaking with the correct person using two identifiers.   At orientation to the IOP program, Case Manager discussed the limitations of evaluation and management by telemedicine and the availability of in person appointments. The patient expressed understanding and agreed to proceed with virtual visits throughout the duration of the program.   Location:  Patient: Patient Home Provider: [facility]   History of Present Illness: MDD with anxious distress    Observations/Objective: Check In: Case Manager checked in with all participants to review discharge dates, insurance authorizations, work-related documents and needs from the treatment team regarding medications. Kristoff stated needs and engaged in discussion.    Initial Therapeutic Activity: Counselor facilitated a check-in with Jahmez to assess for safety, sobriety and medication compliance.  Counselor also inquired about Khyrin's current emotional ratings, as well as any significant changes in thoughts, feelings or behavior since previous check in.  Malaky presented for session on time and was alert, oriented x5, with no evidence or self-report of active SI/HI or A/V H.  Jaqwon reported compliance with medication and denied use of alcohol or illicit substances.  Rithy reported scores of 8/10 for depression, 10/10 for anxiety, and 6/10 for anger/irritability.  Thong denied any recent outbursts or panic attacks.  Zekiel reported that a recent success was doing some online bible study work yesterday.  Dillin reported that a struggle was feeling irritable yesterday due to the stress in his marriage.  He reported that he would need to leave group after break at 10:30am in order to attend a doctors appointment at 11:15am.      Second Therapeutic Activity: Counselor introduced topic of  self-care today.  Counselor explained how this can be defined as the things one does to maintain good health and improve well-being.  Counselor provided members with a self-care assessment form to complete.  This handout featured various sub-categories of self-care, including physical, psychological/emotional, social, spiritual, and professional.  Members were asked to rank their engagement in the activities listed for each dimension on a scale of 1-3, with 1 indicating 'Poor', 2 indicating '[place]', and 3 indicating 'Well'.  Counselor invited members to share results of their assessment, and inquired about which areas of self-care they are doing well in, as well as areas that require attention, and how they plan to begin addressing this during treatment.  Intervention was effective, as evidenced by [name] successfully completing first section of assessment and actively engaging in discussion on subject, reporting that he is excelling in areas such as eating healthy foods, eating regularly, attending preventative medical appointments, and resting when sick, but would benefit from focusing more on areas such as maintaining personal hygiene, exercising regularly, wearing clothes that make him feel good about himself, participating in fun activities, and getting enough sleep.  Taedyn reported that he would work to improve self-care deficits by making an effort to shower and shave more regularly for better hygiene, find a workout routine that isn't too demanding upon his body such as low impact martial arts, and implement sleep hygiene techniques to establish a healthier rest routine.  Attilio left group at 10:30am in order to attend his doctor's appointment on time.      Assessment and Plan: Counselor recommends that [facility] remain in IOP treatment to better manage mental health symptoms, ensure stability and pursue completion of  treatment plan goals. Counselor recommends adherence to crisis/safety plan, taking  medications as prescribed, and following up with medical professionals if any issues arise.   Follow Up Instructions: Counselor will send Webex link for next session. [name] was advised to call back or seek an in-person evaluation if the symptoms worsen or if the condition fails to improve as anticipated.   I provided 90 minutes of non-face-to-face time during this encounter.   [name], [place], LCAS 10/14/21

## 2021-10-15 ENCOUNTER — Other Ambulatory Visit (HOSPITAL_COMMUNITY): Payer: 59 | Attending: Psychiatry | Admitting: Psychiatry

## 2021-10-15 DIAGNOSIS — F1910 Other psychoactive substance abuse, uncomplicated: Secondary | ICD-10-CM | POA: Insufficient documentation

## 2021-10-15 DIAGNOSIS — R454 Irritability and anger: Secondary | ICD-10-CM | POA: Diagnosis not present

## 2021-10-15 DIAGNOSIS — F419 Anxiety disorder, unspecified: Secondary | ICD-10-CM | POA: Insufficient documentation

## 2021-10-15 DIAGNOSIS — G479 Sleep disorder, unspecified: Secondary | ICD-10-CM | POA: Insufficient documentation

## 2021-10-15 DIAGNOSIS — F1090 Alcohol use, unspecified, uncomplicated: Secondary | ICD-10-CM | POA: Diagnosis not present

## 2021-10-15 DIAGNOSIS — F329 Major depressive disorder, single episode, unspecified: Secondary | ICD-10-CM | POA: Diagnosis not present

## 2021-10-15 DIAGNOSIS — Z79899 Other long term (current) drug therapy: Secondary | ICD-10-CM | POA: Diagnosis not present

## 2021-10-15 LAB — CBC WITH DIFFERENTIAL/PLATELET
Absolute Monocytes: 528 cells/uL (ref 200–950)
Basophils Absolute: 30 cells/uL (ref 0–200)
Basophils Relative: 0.5 %
Eosinophils Absolute: 102 cells/uL (ref 15–500)
Eosinophils Relative: 1.7 %
HCT: 41.8 % (ref 38.5–50.0)
Hemoglobin: 13.7 g/dL (ref 13.2–17.1)
Lymphs Abs: 1938 cells/uL (ref 850–3900)
MCH: 30.2 pg (ref 27.0–33.0)
MCHC: 32.8 g/dL (ref 32.0–36.0)
MCV: 92.3 fL (ref 80.0–100.0)
MPV: 10.3 fL (ref 7.5–12.5)
Monocytes Relative: 8.8 %
Neutro Abs: 3402 cells/uL (ref 1500–7800)
Neutrophils Relative %: 56.7 %
Platelets: 260 10*3/uL (ref 140–400)
RBC: 4.53 10*6/uL (ref 4.20–5.80)
RDW: 12.6 % (ref 11.0–15.0)
Total Lymphocyte: 32.3 %
WBC: 6 10*3/uL (ref 3.8–10.8)

## 2021-10-15 LAB — BASIC METABOLIC PANEL WITH GFR
BUN: 11 mg/dL (ref 7–25)
CO2: 23 mmol/L (ref 20–32)
Calcium: 9.3 mg/dL (ref 8.6–10.3)
Chloride: 104 mmol/L (ref 98–110)
Creat: 0.92 mg/dL (ref 0.70–1.30)
Glucose, Bld: 140 mg/dL — ABNORMAL HIGH (ref 65–99)
Potassium: 4.7 mmol/L (ref 3.5–5.3)
Sodium: 135 mmol/L (ref 135–146)
eGFR: 100 mL/min/{1.73_m2} (ref >=60)

## 2021-10-15 LAB — C-REACTIVE PROTEIN: CRP: 0.8 mg/L (ref <8.0)

## 2021-10-15 LAB — SEDIMENTATION RATE: Sed Rate: 6 mm/h (ref 0–20)

## 2021-10-15 NOTE — Progress Notes (Signed)
Virtual Visit via Video Note   I connected with Christopher Fernandez on 10/15/21 at  9:00 AM EDT by a video enabled telemedicine application and verified that I am speaking with the correct person using two identifiers.   At orientation to the IOP program, Case Manager discussed the limitations of evaluation and management by telemedicine and the availability of in person appointments. The patient expressed understanding and agreed to proceed with virtual visits throughout the duration of the program.   Location:  Patient: Patient Home Provider: [facility] [place] [facility]   History of Present Illness: MDD with anxious distress    Observations/Objective: Check In: Case Manager checked in with all participants to review discharge dates, insurance authorizations, work-related documents and needs from the treatment team regarding medications. Christopher Fernandez stated needs and engaged in discussion.    Initial Therapeutic Activity: Counselor facilitated a check-in with Christopher Fernandez to assess for safety, sobriety and medication compliance.  Counselor also inquired about Christopher Fernandez's current emotional ratings, as well as any significant changes in thoughts, feelings or behavior since previous check in.  Christopher Fernandez presented for session on time and was alert, oriented x5, with no evidence or self-report of active SI/HI or A/V H.  Christopher Fernandez reported compliance with medication and denied use of alcohol or illicit substances.  Christopher Fernandez reported scores of 9/10 for depression, 10/10 for anxiety, and 6/10 for anger/irritability.  Christopher Fernandez denied any recent outbursts or panic attacks.  Christopher Fernandez reported that a recent success was attending a doctor's appointment yesterday and having a pneumonia shot.  He also reported that engagement in group has helped him gain more insight into his previous patterns of self-medicating depression and anxiety with substances.  Christopher Fernandez reported that today he is struggling with fatigue from lack of sleep, and was given the  chance to be excused, but declined, stating I'm gonna try to push through.  Christopher Fernandez reported that his goal today is to start reading a new book about WWII for self-care.        Second Therapeutic Activity: Counselor introduced topic of stress management today.  Counselor provided definition of stress as feeling tense, overwhelmed, worn out, and/or exhausted, and noted that in small amounts, stress can be motivating until things become too overwhelming to manage.  Counselor also explained how stress can be acute (brief but intense) or chronic (long-lasting) and this can impact the severity of symptoms one can experience in the physical, emotional, and behavioral categories.  Counselor inquired about the types of anxiety/stress that members tend to experience, as well as the impact it has had upon their thoughts, feelings, and behavior.  Counselor introduced several helpful strategies for members to explore to aid in providing stress reduction, such as mindful breathing to stay grounded and clear mind, progressive muscle relaxation to alleviate tension in the body, and guided imagery to visualize a safe space one can temporarily escape to when needed.  Counselor also discussed various cognitive distortions which could lead to feelings of increased stress, such as jumping to conclusions, overgeneralizing, mental filtering, and labeling.  Counselor asked which ones members could relate to, and recommended keeping a thought journal to document these unhelpful thinking styles, and begin to challenge them to interrupt cycle.  Counselor encouraged members to consider discussing stressor 'red flags' with their close supports that can be monitored and strategies for assisting them in times of crisis.  Intervention was effective, as evidenced by Christopher Fernandez actively participating in discussion on subject, reporting that his most prominent stressors include family conflict, maintaining  good health, lack of confidence,  loneliness, financial struggles, pain/fatigue, planning for retirement, and news of war/terrorism.  Christopher Fernandez reported that he will plan to be more mindful of warning signs that could indicate heightened stress, such as feelings of loneliness, anxiety, and depression worsening.  Christopher Fernandez reported that he has attempted to numb difficult feelings such as stress for years, so his stress management goal is to utilize a daily stress tracker to get more in touch with his feelings, and monitor success of new coping skills.  Christopher Fernandez participated in deep breathing activity today in session, and stated It was very relaxing, and I could have fallen asleep.  He reported that he would plan for pleasant activities to look forward to which could ensure healthy stress outlet as well, such as going on a bike ride, fishing, or sitting outside watching the birds.    Assessment and Plan: Counselor recommends that Christopher Fernandez remain in IOP treatment to better manage mental health symptoms, ensure stability and pursue completion of treatment plan goals. Counselor recommends adherence to crisis/safety plan, taking medications as prescribed, and following up with medical professionals if any issues arise.   Follow Up Instructions: Counselor will send Webex link for next session. Christopher Fernandez was advised to call back or seek an in-person evaluation if the symptoms worsen or if the condition fails to improve as anticipated.   I provided 180 minutes of non-face-to-face time during this encounter.   Christopher Flood, LCSW, LCAS 10/15/21

## 2021-10-16 ENCOUNTER — Other Ambulatory Visit: Payer: Self-pay

## 2021-10-16 ENCOUNTER — Other Ambulatory Visit (HOSPITAL_COMMUNITY): Payer: 59 | Admitting: Licensed Clinical Social Worker

## 2021-10-16 DIAGNOSIS — F419 Anxiety disorder, unspecified: Secondary | ICD-10-CM | POA: Diagnosis not present

## 2021-10-16 DIAGNOSIS — F1090 Alcohol use, unspecified, uncomplicated: Secondary | ICD-10-CM | POA: Diagnosis not present

## 2021-10-16 DIAGNOSIS — F329 Major depressive disorder, single episode, unspecified: Secondary | ICD-10-CM

## 2021-10-16 DIAGNOSIS — R454 Irritability and anger: Secondary | ICD-10-CM | POA: Diagnosis not present

## 2021-10-16 DIAGNOSIS — G479 Sleep disorder, unspecified: Secondary | ICD-10-CM | POA: Diagnosis not present

## 2021-10-16 DIAGNOSIS — Z79899 Other long term (current) drug therapy: Secondary | ICD-10-CM | POA: Diagnosis not present

## 2021-10-16 DIAGNOSIS — F1910 Other psychoactive substance abuse, uncomplicated: Secondary | ICD-10-CM | POA: Diagnosis not present

## 2021-10-16 NOTE — Progress Notes (Signed)
Virtual Visit via Video Note   I connected with Christopher Fernandez on 10/16/21 at  9:00 AM EDT by a video enabled telemedicine application and verified that I am speaking with the correct person using two identifiers.   At orientation to the IOP program, Case Manager discussed the limitations of evaluation and management by telemedicine and the availability of in person appointments. The patient expressed understanding and agreed to proceed with virtual visits throughout the duration of the program.   Location:  Patient: Patient Home Provider: [facility] [place] [facility]   History of Present Illness: MDD with anxious distress    Observations/Objective: Check In: Case Manager checked in with all participants to review discharge dates, insurance authorizations, work-related documents and needs from the treatment team regarding medications. Christopher Fernandez stated needs and engaged in discussion.    Initial Therapeutic Activity: Counselor facilitated a check-in with Christopher Fernandez to assess for safety, sobriety and medication compliance.  Counselor also inquired about Christopher Fernandez's current emotional ratings, as well as any significant changes in thoughts, feelings or behavior since previous check in.  Christopher Fernandez presented for session on time and was alert, oriented x5, with no evidence or self-report of active SI/HI or A/V H.  Christopher Fernandez reported compliance with medication and denied use of alcohol or illicit substances.  Christopher Fernandez reported scores of 10/10 for depression, 10/10 for anxiety, and 8/10 for anger/irritability.  Christopher Fernandez denied any recent outbursts or panic attacks.  Christopher Fernandez reported that a recent success was doing some faith-based reading and meditation yesterday for self-care.  Christopher Fernandez reported that a recent struggle was having a difficult conversation with his wife yesterday about the state of their marriage, stating It didn't really go well.  Christopher Fernandez reported that his goal is to attend an online bible study group at 1pm.        Second Therapeutic Activity: Counselor introduced topic of building social support network today.  Counselor explained how this can be defined as having a having a group of healthy people in one's life you can talk to, spend time with, and get help from to improve both mental and physical health.  Counselor noted that some barriers can make it difficult to connect with other people, including the presence of anxiety or depression, or moving to an unfamiliar area.  Group members were asked to assess the current state of their support network, and identify ways that this could be improved.  Tips were given on how to address previously noted barriers, such as strengthening social skills, using relaxation techniques to reduce anxiety, scheduling social time each week, and/or exploring social events nearby which could increase chances of meeting new supports.  Members were also encouraged to consider getting closer to people they already know through suggestions such as outreaching someone by text, email or phone call if they haven't spoken in awhile, doing something nice for a friend/family member unexpectedly, and/or inviting someone over for a game/movie/dinner night.  Intervention was effective, as evidenced by Christopher Fernandez actively participating in discussion on subject, reporting that his goal for improving overall social support is to begin attending church in person weekly once he learns more coping skills.  Christopher Fernandez reported that some of his barriers include decrease in social skills and more unapproachable nonverbal communication style that developed following his move to [place], worsening of depressive symptoms, and impact of COVID 19 pandemic further reinforcing isolation.  Christopher Fernandez reported that he is currently attending online bible study to increase socialization, and will consider engaging in social events around the community  once he gets more comfortable.   Assessment and Plan: Counselor recommends that  Christopher Fernandez remain in IOP treatment to better manage mental health symptoms, ensure stability and pursue completion of treatment plan goals. Counselor recommends adherence to crisis/safety plan, taking medications as prescribed, and following up with medical professionals if any issues arise.   Follow Up Instructions: Counselor will send Webex link for next session. Christopher Fernandez was advised to call back or seek an in-person evaluation if the symptoms worsen or if the condition fails to improve as anticipated.   I provided 180 minutes of non-face-to-face time during this encounter.   Christopher Flood, LCSW, LCAS 10/16/21

## 2021-10-17 ENCOUNTER — Other Ambulatory Visit (HOSPITAL_COMMUNITY): Payer: 59 | Admitting: Licensed Clinical Social Worker

## 2021-10-17 ENCOUNTER — Other Ambulatory Visit: Payer: Self-pay

## 2021-10-17 DIAGNOSIS — F1090 Alcohol use, unspecified, uncomplicated: Secondary | ICD-10-CM | POA: Diagnosis not present

## 2021-10-17 DIAGNOSIS — F329 Major depressive disorder, single episode, unspecified: Secondary | ICD-10-CM

## 2021-10-17 DIAGNOSIS — G479 Sleep disorder, unspecified: Secondary | ICD-10-CM | POA: Diagnosis not present

## 2021-10-17 DIAGNOSIS — R454 Irritability and anger: Secondary | ICD-10-CM | POA: Diagnosis not present

## 2021-10-17 DIAGNOSIS — F419 Anxiety disorder, unspecified: Secondary | ICD-10-CM | POA: Diagnosis not present

## 2021-10-17 DIAGNOSIS — F1910 Other psychoactive substance abuse, uncomplicated: Secondary | ICD-10-CM | POA: Diagnosis not present

## 2021-10-17 DIAGNOSIS — Z79899 Other long term (current) drug therapy: Secondary | ICD-10-CM | POA: Diagnosis not present

## 2021-10-17 NOTE — Progress Notes (Signed)
Virtual Visit via Video Note   I connected with Christopher Fernandez on 10/17/21 at  9:00 AM EDT by a video enabled telemedicine application and verified that I am speaking with the correct person using two identifiers.   At orientation to the IOP program, Case Manager discussed the limitations of evaluation and management by telemedicine and the availability of in person appointments. The patient expressed understanding and agreed to proceed with virtual visits throughout the duration of the program.   Location:  Patient: Patient Home Provider: [facility] [place] [facility]   History of Present Illness: MDD with anxious distress    Observations/Objective: Check In: Case Manager checked in with all participants to review discharge dates, insurance authorizations, work-related documents and needs from the treatment team regarding medications. Christopher Fernandez stated needs and engaged in discussion.    Initial Therapeutic Activity: Counselor facilitated a check-in with Christopher Fernandez to assess for safety, sobriety and medication compliance.  Counselor also inquired about Christopher Fernandez's current emotional ratings, as well as any significant changes in thoughts, feelings or behavior since previous check in.  Christopher Fernandez presented for session on time and was alert, oriented x5, with no evidence or self-report of active SI/HI or A/V H.  Christopher Fernandez reported compliance with medication and denied use of alcohol or illicit substances.  Christopher Fernandez reported scores of 10/10 for depression, 10/10 for anxiety, and 8/10 for anger/irritability.  Christopher Fernandez denied any recent outbursts or panic attacks.  Christopher Fernandez reported that a recent success was attending an online bible study group yesterday, and then preparing a nice seafood meal later in the evening.  Christopher Fernandez reported that a current struggle is neglecting personal hygiene at times, including skipping showers.  Christopher Fernandez reported that his goal is to prioritize self-care tasks like this more often to improve overall health.         Second Therapeutic Activity: Counselor continued discussion upon topic of self-care today with group members.  Counselor virtually shared self-care assessment form with members via Webex to complete final sub-categories (i.e. social, spiritual, and professional).  Members were asked to rank their engagement in the activities listed for each dimension on a scale of 1-3, with 1 indicating 'Poor', 2 indicating 'Ok', and 3 indicating 'Well'.  Counselor invited members to share results of this assessment, and inquired about which areas of self-care they are doing well in, as well as areas that require attention, and how they plan to begin addressing this during treatment.  Intervention was effective, as evidenced by Christopher Fernandez successfully completing final sections of assessment and actively engaging in discussion on subject, reporting that he is excelling in areas such as praying, and setting aside time for thought or reflection, but would benefit from focusing more on areas such as meditating, acting in accordance with morals and values, appreciating art that is impactful to him, spending time in nature, recognizing things that give meaning to life, and participating in a cause that's important to him.  Christopher Fernandez reported that he would work to improve self-care deficits by setting aside time for a daily meditation routine, stay sober from alcohol and drugs to reinforce healthy values, including bird watching at local parks to routine, and getting involved in church activities to participate in a meaningful cause.    Assessment and Plan: Counselor recommends that Christopher Fernandez remain in IOP treatment to better manage mental health symptoms, ensure stability and pursue completion of treatment plan goals. Counselor recommends adherence to crisis/safety plan, taking medications as prescribed, and following up with medical professionals if any issues arise.  Follow Up Instructions: Counselor will send Webex link for next  session. Christopher Fernandez was advised to call back or seek an in-person evaluation if the symptoms worsen or if the condition fails to improve as anticipated.   I provided 180 minutes of non-face-to-face time during this encounter.   [name], LCSW, LCAS 10/17/21

## 2021-10-20 ENCOUNTER — Other Ambulatory Visit: Payer: Self-pay

## 2021-10-20 ENCOUNTER — Other Ambulatory Visit (HOSPITAL_COMMUNITY): Payer: 59 | Admitting: Licensed Clinical Social Worker

## 2021-10-20 DIAGNOSIS — F1910 Other psychoactive substance abuse, uncomplicated: Secondary | ICD-10-CM | POA: Diagnosis not present

## 2021-10-20 DIAGNOSIS — G479 Sleep disorder, unspecified: Secondary | ICD-10-CM | POA: Diagnosis not present

## 2021-10-20 DIAGNOSIS — F419 Anxiety disorder, unspecified: Secondary | ICD-10-CM | POA: Diagnosis not present

## 2021-10-20 DIAGNOSIS — F1090 Alcohol use, unspecified, uncomplicated: Secondary | ICD-10-CM | POA: Diagnosis not present

## 2021-10-20 DIAGNOSIS — F329 Major depressive disorder, single episode, unspecified: Secondary | ICD-10-CM | POA: Diagnosis not present

## 2021-10-20 DIAGNOSIS — R454 Irritability and anger: Secondary | ICD-10-CM | POA: Diagnosis not present

## 2021-10-20 DIAGNOSIS — Z79899 Other long term (current) drug therapy: Secondary | ICD-10-CM | POA: Diagnosis not present

## 2021-10-20 NOTE — Progress Notes (Signed)
Virtual Visit via Video Note   I connected with Christopher Fernandez on 10/20/21 at  9:00 AM EDT by a video enabled telemedicine application and verified that I am speaking with the correct person using two identifiers.   At orientation to the IOP program, Case Manager discussed the limitations of evaluation and management by telemedicine and the availability of in person appointments. The patient expressed understanding and agreed to proceed with virtual visits throughout the duration of the program.   Location:  Patient: Patient Home Provider: [facility] [place] [facility]   History of Present Illness: MDD with anxious distress    Observations/Objective: Check In: Case Manager checked in with all participants to review discharge dates, insurance authorizations, work-related documents and needs from the treatment team regarding medications. Christopher Fernandez stated needs and engaged in discussion.    Initial Therapeutic Activity: Counselor facilitated a check-in with Christopher Fernandez to assess for safety, sobriety and medication compliance.  Counselor also inquired about Christopher Fernandez's current emotional ratings, as well as any significant changes in thoughts, feelings or behavior since previous check in.  Christopher Fernandez presented for session on time and was alert, oriented x5, with no evidence or self-report of active SI/HI or A/V H.  Christopher Fernandez reported compliance with medication and denied use of alcohol or illicit substances.  Christopher Fernandez reported scores of 6/10 for depression, 10/10 for anxiety, and 4/10 for anger/irritability.  Christopher Fernandez denied any recent outbursts or panic attacks.  Christopher Fernandez reported that a recent success was attending church service online, stating I had intended to go in person, but I didn't have transportation.  I learned a lot.  He also reported that he was able to go for a walk yesterday, stating That was a nice change.  Christopher Fernandez denied facing any new struggles.  Christopher Fernandez reported that his goal today is to take care of some tasks  this afternoon with his wife.         Second Therapeutic Activity: Counselor introduced topic of grounding skills today.  Counselor defined these as simple strategies one can use to help detach from difficult thoughts or feelings temporarily by focusing on something else.  Counselor noted that grounding will not solve the problem at hand, but can provide the practitioner with time to regain control over their thoughts and/or feelings and prevent the situation from getting worse (i.e. interrupting a panic attack).  Counselor divided these into three categories (mental, physical, and soothing) and then provided examples of each which group members could practice during session.  Some of these included describing one's environment in detail or playing a categories game with oneself for mental category, taking a hot bath/shower, stretching, or carrying a grounding object for physical category, and saying kind statements, or visualizing people one cares about for soothing category.  Counselor inquired about which techniques members have used with success in the past, or will commit to learning, practicing, and applying now to improve coping abilities.  Intervention was effective, as evidenced by Christopher Fernandez participating in discussion on the subject, trying out several of the techniques during session, and expressing interest in adding several to his available coping skills, such as describing his environment in detail, playing a categories game listing various football teams and players, reading the bible to himself along with inspiring passages or quotations, splashing cold water on his face, or practicing deep breathing.   Assessment and Plan: Counselor recommends that Christopher Fernandez remain in IOP treatment to better manage mental health symptoms, ensure stability and pursue completion of treatment plan goals. Counselor recommends  adherence to crisis/safety plan, taking medications as prescribed, and following up with  medical professionals if any issues arise.   Follow Up Instructions: Counselor will send Webex link for next session. Christopher Fernandez was advised to call back or seek an in-person evaluation if the symptoms worsen or if the condition fails to improve as anticipated.   I provided 180 minutes of non-face-to-face time during this encounter.   Christopher Flood, LCSW, LCAS 10/20/21

## 2021-10-21 ENCOUNTER — Other Ambulatory Visit: Payer: Self-pay

## 2021-10-21 ENCOUNTER — Other Ambulatory Visit (HOSPITAL_COMMUNITY): Payer: Self-pay

## 2021-10-21 ENCOUNTER — Other Ambulatory Visit (HOSPITAL_COMMUNITY): Payer: 59 | Admitting: Licensed Clinical Social Worker

## 2021-10-21 ENCOUNTER — Other Ambulatory Visit: Payer: Self-pay | Admitting: Family Medicine

## 2021-10-21 DIAGNOSIS — K219 Gastro-esophageal reflux disease without esophagitis: Secondary | ICD-10-CM

## 2021-10-21 DIAGNOSIS — F1090 Alcohol use, unspecified, uncomplicated: Secondary | ICD-10-CM | POA: Diagnosis not present

## 2021-10-21 DIAGNOSIS — R454 Irritability and anger: Secondary | ICD-10-CM | POA: Diagnosis not present

## 2021-10-21 DIAGNOSIS — G479 Sleep disorder, unspecified: Secondary | ICD-10-CM | POA: Diagnosis not present

## 2021-10-21 DIAGNOSIS — F329 Major depressive disorder, single episode, unspecified: Secondary | ICD-10-CM | POA: Diagnosis not present

## 2021-10-21 DIAGNOSIS — F419 Anxiety disorder, unspecified: Secondary | ICD-10-CM | POA: Diagnosis not present

## 2021-10-21 DIAGNOSIS — F1910 Other psychoactive substance abuse, uncomplicated: Secondary | ICD-10-CM | POA: Diagnosis not present

## 2021-10-21 DIAGNOSIS — Z79899 Other long term (current) drug therapy: Secondary | ICD-10-CM | POA: Diagnosis not present

## 2021-10-21 MED ORDER — OMEPRAZOLE 40 MG PO CPDR
40.0000 mg | DELAYED_RELEASE_CAPSULE | Freq: Every day | ORAL | 3 refills | Status: DC
  Filled 2021-10-21: qty 30, 30d supply, fill #0
  Filled 2021-11-18: qty 30, 30d supply, fill #1
  Filled 2021-12-16: qty 30, 30d supply, fill #2

## 2021-10-21 MED FILL — Montelukast Sodium Tab 10 MG (Base Equiv): ORAL | 90 days supply | Qty: 90 | Fill #2 | Status: AC

## 2021-10-21 NOTE — Addendum Note (Signed)
Addended by: Derrill Center on: 10/21/2021 03:42 PM   Modules accepted: Orders

## 2021-10-21 NOTE — Progress Notes (Signed)
Virtual Visit via Video Note   I connected with Christopher Fernandez on 10/21/21 at  9:00 AM EDT by a video enabled telemedicine application and verified that I am speaking with the correct person using two identifiers.   At orientation to the IOP program, Case Manager discussed the limitations of evaluation and management by telemedicine and the availability of in person appointments. The patient expressed understanding and agreed to proceed with virtual visits throughout the duration of the program.   Location:  Patient: Patient Home Provider: [facility] [place] [facility]   History of Present Illness: MDD with anxious distress    Observations/Objective: Check In: Case Manager checked in with all participants to review discharge dates, insurance authorizations, work-related documents and needs from the treatment team regarding medications. Christopher Fernandez stated needs and engaged in discussion.    Initial Therapeutic Activity: Counselor facilitated a check-in with Christopher Fernandez to assess for safety, sobriety and medication compliance.  Counselor also inquired about Christopher Fernandez's current emotional ratings, as well as any significant changes in thoughts, feelings or behavior since previous check in.  Christopher Fernandez presented for session on time and was alert, oriented x5, with no evidence or self-report of active SI/HI or A/V H.  Christopher Fernandez reported compliance with medication and denied use of alcohol or illicit substances.  Christopher Fernandez reported scores of 8/10 for depression, 10/10 for anxiety, and 7/10 for anger/irritability.  Christopher Fernandez denied any recent outbursts or panic attacks.  Christopher Fernandez reported that a recent success was getting out of the home to do some shopping with his wife.  He reported that he also read his bible and spoke with some family members for self-care.  Christopher Fernandez reported that a current struggle is dealing with insomnia, stating I've dealt with it before, but this is one of the worst periods I've gone through.  Christopher Fernandez reported that  his goal is to speak with the NP about this and consider entering a sleep study for additional assistance.         Second Therapeutic Activity: Counselor engaged the group in discussion on managing work/life balance today to improve mental health and wellness.  Counselor explained how finding balance between responsibilities at home and work place can be challenging, lead to increased stress, and this has been further complicated by recent pandemic leading to unemployment, more virtual work, and blurring of lines between home as a place of rest or work duties.  Counselor facilitated discussion on what challenges members have faced with this issue historically, as well as what, if any, issues have arisen following pandemic. Counselor also discussed strategies for improving work/life balance while members work on their mental health during treatment.  Some of these included keeping track of time management; creating a list of priorities and scaling importance; setting realistic, measurable goals each day; establishing boundaries; taking care of health needs; and nurturing relationships at home and work for support.  Counselor inquired about areas where members feel they are excelling, as well as areas they could focus on during treatment. Intervention was effective, as evidenced by Christopher Fernandez actively engaging in discussion on subject, reporting that although he has not worked a conventional job in over 3 years, he was able to identify several warning signs and symptoms of burnout present in his life.  Christopher Fernandez reported that these include feeling tired or drained most days, having a lowered immunity, decreased sleep, feelings of self-doubt, detachment, and isolation from others.  Christopher Fernandez reported that he would work to address work life imbalance by being more honest about his  feelings with positive supports to avoid compartmentalization, and focus on increasing general exercise in schedule and making adjustments to diet  with feedback from PCP to ensure stress outlet and improve general health and wellbeing.    Assessment and Plan: Counselor recommends that [facility] remain in IOP treatment to better manage mental health symptoms, ensure stability and pursue completion of treatment plan goals. Counselor recommends adherence to crisis/safety plan, taking medications as prescribed, and following up with medical professionals if any issues arise.   Follow Up Instructions: Counselor will send Webex link for next session. Christopher Fernandez was advised to call back or seek an in-person evaluation if the symptoms worsen or if the condition fails to improve as anticipated.   I provided 180 minutes of non-face-to-face time during this encounter.   [name], LCSW, LCAS 10/21/21

## 2021-10-21 NOTE — Progress Notes (Signed)
Christopher Fernandez reports ongoing issues with  staying asleep.  Initiated hydroxyzine 50 mg p.o. nightly as needed however states medication has been ineffective.  States he has tried multiple medications in the past i.e. Ambien, trazodone, Seroquel and is currently on Remeron.  Denies that he has tried doxepin, however he reported that he would like to follow-up with sleep study. Patient to  consider at follow-up with primary mental health care provider.  Discussed following up with primary care to consider sleep study.  Sleep's consult was ordered.  He was receptive to plan.  Support, encouragement reassurance was provided.

## 2021-10-22 ENCOUNTER — Other Ambulatory Visit (HOSPITAL_COMMUNITY): Payer: 59 | Admitting: Licensed Clinical Social Worker

## 2021-10-22 ENCOUNTER — Encounter: Payer: Self-pay | Admitting: Gastroenterology

## 2021-10-22 ENCOUNTER — Ambulatory Visit: Payer: 59 | Admitting: Gastroenterology

## 2021-10-22 ENCOUNTER — Other Ambulatory Visit: Payer: Self-pay

## 2021-10-22 ENCOUNTER — Other Ambulatory Visit (HOSPITAL_COMMUNITY): Payer: Self-pay

## 2021-10-22 VITALS — BP 106/68 | HR 78 | Temp 98.6°F | Ht 75.0 in | Wt 235.0 lb

## 2021-10-22 DIAGNOSIS — F329 Major depressive disorder, single episode, unspecified: Secondary | ICD-10-CM

## 2021-10-22 DIAGNOSIS — Z79899 Other long term (current) drug therapy: Secondary | ICD-10-CM | POA: Diagnosis not present

## 2021-10-22 DIAGNOSIS — G479 Sleep disorder, unspecified: Secondary | ICD-10-CM | POA: Diagnosis not present

## 2021-10-22 DIAGNOSIS — F419 Anxiety disorder, unspecified: Secondary | ICD-10-CM | POA: Diagnosis not present

## 2021-10-22 DIAGNOSIS — R454 Irritability and anger: Secondary | ICD-10-CM | POA: Diagnosis not present

## 2021-10-22 DIAGNOSIS — F1090 Alcohol use, unspecified, uncomplicated: Secondary | ICD-10-CM | POA: Diagnosis not present

## 2021-10-22 DIAGNOSIS — Z1211 Encounter for screening for malignant neoplasm of colon: Secondary | ICD-10-CM

## 2021-10-22 DIAGNOSIS — F1910 Other psychoactive substance abuse, uncomplicated: Secondary | ICD-10-CM | POA: Diagnosis not present

## 2021-10-22 MED ORDER — SODIUM CHLORIDE 0.9 % IV SOLN: 500.0000 mL | Freq: Once | INTRAVENOUS | Status: DC

## 2021-10-22 MED ORDER — PEG-3350/ELECTROLYTES 236 G PO SOLR
4000.0000 mL | Freq: Once | ORAL | 0 refills | Status: AC
  Filled 2021-10-22 – 2021-10-23 (×3): qty 4000, 1d supply, fill #0

## 2021-10-22 MED ORDER — PLENVU 140 G PO SOLR
1.0000 | ORAL | 0 refills | Status: DC
  Filled 2021-10-22: qty 3, 1d supply, fill #0

## 2021-10-22 MED ORDER — NA SULFATE-K SULFATE-MG SULF 17.5-3.13-1.6 GM/177ML PO SOLN
1.0000 | Freq: Once | ORAL | 0 refills | Status: AC
  Filled 2021-10-22: qty 354, 1d supply, fill #0

## 2021-10-22 MED ORDER — NA SULFATE-K SULFATE-MG SULF 17.5-3.13-1.6 GM/177ML PO SOLN
1.0000 | Freq: Once | ORAL | 0 refills | Status: AC
  Filled 2021-10-22 – 2021-10-23 (×2): qty 354, 1d supply, fill #0

## 2021-10-22 MED ORDER — FLEET ENEMA 7-19 GM/118ML RE ENEM
1.0000 | ENEMA | Freq: Once | RECTAL | Status: AC
  Administered 2021-10-22: 1 via RECTAL

## 2021-10-22 NOTE — Progress Notes (Signed)
Vs by DT in adm   Saline Enema done per pt in bathroom as ordered by Dr Rush Landmark .

## 2021-10-22 NOTE — Progress Notes (Signed)
Virtual Visit via Video Note   I connected with Christopher Fernandez on 10/22/21 at  9:00 AM EDT by a video enabled telemedicine application and verified that I am speaking with the correct person using two identifiers.   At orientation to the IOP program, Case Manager discussed the limitations of evaluation and management by telemedicine and the availability of in person appointments. The patient expressed understanding and agreed to proceed with virtual visits throughout the duration of the program.   Location:  Patient: Patient Home Provider: [facility] [place] [facility]   History of Present Illness: MDD with anxious distress    Observations/Objective: Check In: Case Manager checked in with all participants to review discharge dates, insurance authorizations, work-related documents and needs from the treatment team regarding medications. Christopher Fernandez stated needs and engaged in discussion.    Initial Therapeutic Activity: Counselor facilitated a check-in with Christopher Fernandez to assess for safety, sobriety and medication compliance.  Counselor also inquired about Christopher Fernandez's current emotional ratings, as well as any significant changes in thoughts, feelings or behavior since previous check in.  Christopher Fernandez presented for session on time and was alert, oriented x5, with no evidence or self-report of active SI/HI or A/V H.  Christopher Fernandez reported compliance with medication and denied use of alcohol or illicit substances.  Christopher Fernandez reported scores of 6/10 for depression, 10/10 for anxiety, and 3/10 for anger/irritability.  Christopher Fernandez denied any recent outbursts or panic attacks.  Christopher Fernandez reported that a recent success was speaking with his father and sister yesterday as he continues to work towards curbing isolation and strengthening relationships with supports.  Christopher Fernandez reported that a present struggle is feeling like his medications aren't working, although he plans to speak with NP about this and locate a psychiatrist to assist with medication  management prior to [name] discharge.  Christopher Fernandez reported that his goal today is to attend and appointment at 11am, which required him to leave group early at 10:30am.      Second Therapeutic Activity: Counselor introduced Christopher Fernandez, [name], to provide psychoeducation on topic of medication compliance with members today.  Christopher Fernandez provided psychoeducation on classes of medications such as antidepressants, antipsychotics, what symptoms they are intended to treat, and any side effects one might encounter while on a particular prescription.  Time was allowed for clients to ask any questions they might have of [name] regarding this specialty.  Intervention was effective, as evidenced by Christopher Fernandez participating in discussion with speaker on the subject, reporting that he has been on behavioral medications for several years, but is curious if there are other options which could be more effective in managing symptoms.  Christopher Fernandez was receptive to feedback offered from pharmacist regarding this inquiry.    Third Therapeutic Activity: Counselor provided psychoeducation on subject of boundaries with group members today using a virtual handout.  This handout defined boundaries as the limits and rules that we set for ourselves within relationships, and featured a breakdown of the 3 common categories of boundaries (i.e. porous, rigid, and healthy), along with typical traits specific to each one for easy identification.  It was noted that most people have a mixture of different boundary types depending on setting, person, and culture.  Additional information was provided on the types of boundaries (i.e. physical, intellectual, emotional, sexual, material, and time) within relationships, and what could be considered healthy versus unhealthy. Counselor tasked members with identifying what types of boundaries they presently hold within her own support systems, the collective impact these boundaries have upon their mental  health,  and changes that could be made in order to more effectively communicate individual mental health needs.  Intervention effectiveness could not be measured due to [facility] leaving at break in order to attend his appointment on time at 11am.    Assessment and Plan: Counselor recommends that [facility] remain in IOP treatment to better manage mental health symptoms, ensure stability and pursue completion of treatment plan goals. Counselor recommends adherence to crisis/safety plan, taking medications as prescribed, and following up with medical professionals if any issues arise.   Follow Up Instructions: Counselor will send Webex link for next session. Christopher Fernandez was advised to call back or seek an in-person evaluation if the symptoms worsen or if the condition fails to improve as anticipated.   I provided 90 minutes of non-face-to-face time during this encounter.   [name], LCSW, LCAS 10/22/21

## 2021-10-22 NOTE — Progress Notes (Signed)
Patient is not prepped again. Even with a 2-day prep. Patient is a normal average risk individual. Plan will be MiraLAX daily and Dulcolax 10 mg every other day for 1 week prior to initiation of his 2-day preparation. He will be rescheduled next available based on his schedule in March/April/May. No late cancellation fee.  Justice Britain, MD [facility] # [phone]

## 2021-10-23 ENCOUNTER — Other Ambulatory Visit: Payer: Self-pay

## 2021-10-23 ENCOUNTER — Other Ambulatory Visit (HOSPITAL_COMMUNITY): Payer: Self-pay

## 2021-10-23 ENCOUNTER — Other Ambulatory Visit (HOSPITAL_COMMUNITY): Payer: 59 | Admitting: Licensed Clinical Social Worker

## 2021-10-23 DIAGNOSIS — F329 Major depressive disorder, single episode, unspecified: Secondary | ICD-10-CM | POA: Diagnosis not present

## 2021-10-23 DIAGNOSIS — R454 Irritability and anger: Secondary | ICD-10-CM | POA: Diagnosis not present

## 2021-10-23 DIAGNOSIS — F1910 Other psychoactive substance abuse, uncomplicated: Secondary | ICD-10-CM | POA: Diagnosis not present

## 2021-10-23 DIAGNOSIS — F419 Anxiety disorder, unspecified: Secondary | ICD-10-CM | POA: Diagnosis not present

## 2021-10-23 DIAGNOSIS — Z79899 Other long term (current) drug therapy: Secondary | ICD-10-CM | POA: Diagnosis not present

## 2021-10-23 DIAGNOSIS — F1090 Alcohol use, unspecified, uncomplicated: Secondary | ICD-10-CM | POA: Diagnosis not present

## 2021-10-23 DIAGNOSIS — G479 Sleep disorder, unspecified: Secondary | ICD-10-CM | POA: Diagnosis not present

## 2021-10-23 NOTE — Progress Notes (Signed)
Virtual Visit via Video Note   I connected with Christopher Fernandez on 10/23/21 at  9:00 AM EDT by a video enabled telemedicine application and verified that I am speaking with the correct person using two identifiers.   At orientation to the IOP program, Case Manager discussed the limitations of evaluation and management by telemedicine and the availability of in person appointments. The patient expressed understanding and agreed to proceed with virtual visits throughout the duration of the program.   Location:  Patient: Patient Home Provider: [facility] [place] [facility]   History of Present Illness: MDD with anxious distress    Observations/Objective: Check In: Case Manager checked in with all participants to review discharge dates, insurance authorizations, work-related documents and needs from the treatment team regarding medications. Christopher Fernandez stated needs and engaged in discussion.    Initial Therapeutic Activity: Counselor facilitated a check-in with Christopher Fernandez to assess for safety, sobriety and medication compliance.  Counselor also inquired about Christopher Fernandez's current emotional ratings, as well as any significant changes in thoughts, feelings or behavior since previous check in.  Christopher Fernandez presented for session on time and was alert, oriented x5, with no evidence or self-report of active SI/HI or A/V H.  Christopher Fernandez reported compliance with medication and denied use of alcohol or illicit substances.  Christopher Fernandez reported scores of 3/10 for depression, 8/10 for anxiety, and 2/10 for anger/irritability.  Christopher Fernandez denied any recent outbursts or panic attacks.  Christopher Fernandez reported that a recent success was attending a doctor's appointment which went well and running some errands with his wife afterward.  Christopher Fernandez denied any new challenges at this time.  Christopher Fernandez reported that his goal today is to get outdoors due to the warmer weather, stating I could go take a walk.         Second Therapeutic Activity: Counselor covered topic of core  beliefs with group today.  Counselor virtually shared a handout on the subject, which explained how everyone looks at the world differently, and two people can have the same experience, but have different interpretations of what happened.  Members were encouraged to think of these like sunglasses with different shades influencing perception towards positive or negative outcomes.  Examples of negative core beliefs were provided, such as I'm unlovable, I'm not good enough, and I'm a bad person.  Members were asked to share which one(s) they could relate to, and then identify evidence which contradicts these beliefs.  Intervention was effective, as evidenced by Christopher Fernandez successfully participating in discussion on the subject and reporting that he could relate to several negative core beliefs listed on the handout, such as I am a loser, I am unlovable, and Nothing ever goes right.  Christopher Fernandez was able to successfully challenge these beliefs, reporting that he has many positive traits, such as leaning into his faith recently, trying to repair his marriage, maintaining sobriety, and possessing intelligence.  Christopher Fernandez reported that his goal is to consistently work on these in order to boost self-esteem and sense of hope for the future.     Assessment and Plan: Counselor recommends that Christopher Fernandez remain in IOP treatment to better manage mental health symptoms, ensure stability and pursue completion of treatment plan goals. Counselor recommends adherence to crisis/safety plan, taking medications as prescribed, and following up with medical professionals if any issues arise.   Follow Up Instructions: Counselor will send Webex link for next session. Christopher Fernandez was advised to call back or seek an in-person evaluation if the symptoms worsen or if the condition fails to improve  as anticipated.   I provided 180 minutes of non-face-to-face time during this encounter.   Christopher Flood, LCSW, LCAS 10/23/21

## 2021-10-24 ENCOUNTER — Other Ambulatory Visit (HOSPITAL_COMMUNITY): Payer: 59 | Admitting: Licensed Clinical Social Worker

## 2021-10-24 ENCOUNTER — Other Ambulatory Visit (HOSPITAL_COMMUNITY): Payer: Self-pay

## 2021-10-24 DIAGNOSIS — Z79899 Other long term (current) drug therapy: Secondary | ICD-10-CM | POA: Diagnosis not present

## 2021-10-24 DIAGNOSIS — G479 Sleep disorder, unspecified: Secondary | ICD-10-CM | POA: Diagnosis not present

## 2021-10-24 DIAGNOSIS — F329 Major depressive disorder, single episode, unspecified: Secondary | ICD-10-CM

## 2021-10-24 DIAGNOSIS — R454 Irritability and anger: Secondary | ICD-10-CM | POA: Diagnosis not present

## 2021-10-24 DIAGNOSIS — F419 Anxiety disorder, unspecified: Secondary | ICD-10-CM | POA: Diagnosis not present

## 2021-10-24 DIAGNOSIS — F1910 Other psychoactive substance abuse, uncomplicated: Secondary | ICD-10-CM | POA: Diagnosis not present

## 2021-10-24 DIAGNOSIS — F1090 Alcohol use, unspecified, uncomplicated: Secondary | ICD-10-CM | POA: Diagnosis not present

## 2021-10-24 NOTE — Progress Notes (Signed)
Virtual Visit via Video Note   I connected with Christopher Fernandez on 10/24/21 at  9:00 AM EDT by a video enabled telemedicine application and verified that I am speaking with the correct person using two identifiers.   At orientation to the IOP program, Case Manager discussed the limitations of evaluation and management by telemedicine and the availability of in person appointments. The patient expressed understanding and agreed to proceed with virtual visits throughout the duration of the program.   Location:  Patient: Patient Home Provider: [facility] [place] [facility]   History of Present Illness: MDD with anxious distress    Observations/Objective: Check In: Case Manager checked in with all participants to review discharge dates, insurance authorizations, work-related documents and needs from the treatment team regarding medications. Christopher Fernandez stated needs and engaged in discussion.    Initial Therapeutic Activity: Counselor facilitated a check-in with Christopher Fernandez to assess for safety, sobriety and medication compliance.  Counselor also inquired about Christopher Fernandez's current emotional ratings, as well as any significant changes in thoughts, feelings or behavior since previous check in.  Christopher Fernandez presented for session on time and was alert, oriented x5, with no evidence or self-report of active SI/HI or A/V H.  Christopher Fernandez reported compliance with medication and denied use of alcohol or illicit substances.  Christopher Fernandez reported scores of 3/10 for depression, 7/10 for anxiety, and 2/10 for anger/irritability.  Christopher Fernandez denied any recent outbursts or panic attacks.  Christopher Fernandez reported that a recent success was been establishing more routine self-care pattern over the past week.  Christopher Fernandez denied any present challenges.  Christopher Fernandez reported that his goal this weekend is to attend church in person.         Second Therapeutic Activity: Counselor offered to teach group members an ACT relaxation technique today to aid in managing difficult  thoughts, feelings, urges, and sensations.  Counselor guided members through process of getting comfortable, achieving relaxing breathing rhythm, and then maintaining this throughout activity.  Counselor invited members to imagine a gently flowing stream in their mind with leaves floating upon it, and when any thoughts, feelings, urges, or sensations arose, good or bad, they were instructed to visualize placing them on these passing leaves over course of 10 minutes practice.  Intervention was effective, as evidenced by Christopher Fernandez successfully participating in activity and reporting that it surprised him how well he was able to focus, stating I found it relaxing and would definitely do it again.    Third Therapeutic Activity: Psycho-educational portion of group was provided by Christopher Fernandez, Mud[employer] of community education with [employer].  Christopher Fernandez provided information on history of her local agency, mission statement, and the variety of unique services offered which group members might find beneficial to engage in, including both virtual and in-person support groups, as well as peer support program for mentoring.  Christopher Fernandez offered time to answer member's questions regarding services and encouraged them to consider utilizing these services to assist in working towards their individual wellness goals.  Intervention was effective, as evidenced by Christopher Fernandez participating in discussion with speaker on the subject, reporting that the [facility] support group interested him, and would be helpful for ensuring continuation of group support upon completion of [facility].    Assessment and Plan: Counselor recommends that Christopher Fernandez remain in IOP treatment to better manage mental health symptoms, ensure stability and pursue completion of treatment plan goals. Counselor recommends adherence to crisis/safety plan, taking medications as prescribed, and following up with medical professionals if any issues arise.   Follow Up  Instructions: Counselor will send Webex link for next session. Christopher Fernandez was advised to call back or seek an in-person evaluation if the symptoms worsen or if the condition fails to improve as anticipated.   I provided 180 minutes of non-face-to-face time during this encounter.   Christopher Flood, LCSW, LCAS 10/24/21

## 2021-10-27 ENCOUNTER — Other Ambulatory Visit (HOSPITAL_COMMUNITY): Payer: 59 | Admitting: Licensed Clinical Social Worker

## 2021-10-27 ENCOUNTER — Other Ambulatory Visit: Payer: Self-pay

## 2021-10-27 DIAGNOSIS — F419 Anxiety disorder, unspecified: Secondary | ICD-10-CM | POA: Diagnosis not present

## 2021-10-27 DIAGNOSIS — Z79899 Other long term (current) drug therapy: Secondary | ICD-10-CM | POA: Diagnosis not present

## 2021-10-27 DIAGNOSIS — F1910 Other psychoactive substance abuse, uncomplicated: Secondary | ICD-10-CM | POA: Diagnosis not present

## 2021-10-27 DIAGNOSIS — R454 Irritability and anger: Secondary | ICD-10-CM | POA: Diagnosis not present

## 2021-10-27 DIAGNOSIS — G479 Sleep disorder, unspecified: Secondary | ICD-10-CM | POA: Diagnosis not present

## 2021-10-27 DIAGNOSIS — F329 Major depressive disorder, single episode, unspecified: Secondary | ICD-10-CM | POA: Diagnosis not present

## 2021-10-27 DIAGNOSIS — F1090 Alcohol use, unspecified, uncomplicated: Secondary | ICD-10-CM | POA: Diagnosis not present

## 2021-10-27 NOTE — Progress Notes (Signed)
Virtual Visit via Video Note   I connected with Christopher Fernandez on 10/27/21 at  9:00 AM EDT by a video enabled telemedicine application and verified that I am speaking with the correct person using two identifiers.   At orientation to the IOP program, Case Manager discussed the limitations of evaluation and management by telemedicine and the availability of in person appointments. The patient expressed understanding and agreed to proceed with virtual visits throughout the duration of the program.   Location:  Patient: Patient Home Provider: [facility] Home Office   History of Present Illness: MDD with anxious distress    Observations/Objective: Check In: Case Manager checked in with all participants to review discharge dates, insurance authorizations, work-related documents and needs from the treatment team regarding medications. Christopher Fernandez stated needs and engaged in discussion.    Initial Therapeutic Activity: Counselor facilitated a check-in with Christopher Fernandez to assess for safety, sobriety and medication compliance.  Counselor also inquired about Christopher Fernandez's current emotional ratings, as well as any significant changes in thoughts, feelings or behavior since previous check in.  Christopher Fernandez presented for session on time and was alert, oriented x5, with no evidence or self-report of active SI/HI or A/V H.  Christopher Fernandez reported compliance with medication and denied use of alcohol or illicit substances.  Christopher Fernandez reported scores of 3/10 for depression, 3/10 for anxiety, and 1/10 for anger/irritability.  Christopher Fernandez denied any recent outbursts or panic attacks.  Christopher Fernandez reported that a recent success was getting more sleep over the weekend, stating I actually got a few hours in and that was very beneficial.  Christopher Fernandez reported that a recent struggle was not being able to make it to church, although he was able to attend online.   Christopher Fernandez reported that his goal is to link with a therapist before discharge from [facility] next week.        Second Therapeutic Activity: Counselor introduced topic of self-esteem today and defined this as the value an individual places on oneself, based upon assessment of personal worth as a human being and approval/disapproval of one's behavior. Counselor asked members to assess their level of self-esteem at this time based upon common indicators of high self-esteem, including: accepting oneself unconditionally;  having self-respect and deep seated belief that one matters; being unaffected by other people's opinions/criticisms; and showing good control over emotions.  Counselor also explained concept of one's inner critic which serves to highlight faults and minimize strengths, directly influencing low sense of self-esteem.  Counselor then provided handout on 'strengths and qualities', which featured questions to guide discussion and increase awareness of each member's unique individual abilities which could reinforce higher self-esteem. Examples of questions included: 'things I am good at', 'challenges I have overcome', and 'what I like about myself'.  Intervention was effective, as evidenced by Christopher Fernandez reporting that his self-esteem is Not good at this time, and negative traits he is aware of include lack of attention or feeling ignored, not being listened to or taken seriously, and feelings of guilt or shame.  Christopher Fernandez reported that since beginning Christopher Fernandez and having more interactions outside of the household with supportive, understanding peers, he has realized how much of an impact having a good support network can have on one's mental health and overall self-image.  Christopher Fernandez reported that his goal is to connect with [facility] and return to in person church weekly to strengthen network and surround himself with positive people.  He was also able identify several personal strengths, such as curiosity, empathy, open mindedness,  love, social awareness, enthusiasm, kindness, cooperation, forgiveness,  gratitude, humor, spirituality, adventurousness, and possessing a logical mind.     Assessment and Plan: Counselor recommends that [facility] remain in IOP treatment to better manage mental health symptoms, ensure stability and pursue completion of treatment plan goals. Counselor recommends adherence to crisis/safety plan, taking medications as prescribed, and following up with medical professionals if any issues arise.   Follow Up Instructions: Counselor will send Webex link for next session. Ron was advised to call back or seek an in-person evaluation if the symptoms worsen or if the condition fails to improve as anticipated.   I provided 180 minutes of non-face-to-face time during this encounter.   [name], LCSW, LCAS 10/27/21

## 2021-10-28 ENCOUNTER — Other Ambulatory Visit (HOSPITAL_COMMUNITY): Payer: 59 | Admitting: Licensed Clinical Social Worker

## 2021-10-28 DIAGNOSIS — R454 Irritability and anger: Secondary | ICD-10-CM | POA: Diagnosis not present

## 2021-10-28 DIAGNOSIS — Z79899 Other long term (current) drug therapy: Secondary | ICD-10-CM | POA: Diagnosis not present

## 2021-10-28 DIAGNOSIS — F419 Anxiety disorder, unspecified: Secondary | ICD-10-CM | POA: Diagnosis not present

## 2021-10-28 DIAGNOSIS — G479 Sleep disorder, unspecified: Secondary | ICD-10-CM | POA: Diagnosis not present

## 2021-10-28 DIAGNOSIS — F329 Major depressive disorder, single episode, unspecified: Secondary | ICD-10-CM

## 2021-10-28 DIAGNOSIS — F1090 Alcohol use, unspecified, uncomplicated: Secondary | ICD-10-CM | POA: Diagnosis not present

## 2021-10-28 DIAGNOSIS — F1910 Other psychoactive substance abuse, uncomplicated: Secondary | ICD-10-CM | POA: Diagnosis not present

## 2021-10-28 NOTE — Progress Notes (Signed)
Virtual Visit via Video Note   I connected with Christopher Fernandez on 10/28/21 at  9:00 AM EDT by a video enabled telemedicine application and verified that I am speaking with the correct person using two identifiers.   At orientation to the IOP program, Case Manager discussed the limitations of evaluation and management by telemedicine and the availability of in person appointments. The patient expressed understanding and agreed to proceed with virtual visits throughout the duration of the program.   Location:  Patient: Patient Home Provider: [facility] [place] [facility]   History of Present Illness: MDD with anxious distress    Observations/Objective: Check In: Case Manager checked in with all participants to review discharge dates, insurance authorizations, work-related documents and needs from the treatment team regarding medications. Christopher Fernandez stated needs and engaged in discussion.    Initial Therapeutic Activity: Counselor facilitated a check-in with Christopher Fernandez to assess for safety, sobriety and medication compliance.  Counselor also inquired about Christopher Fernandez's current emotional ratings, as well as any significant changes in thoughts, feelings or behavior since previous check in.  Christopher Fernandez presented for session on time and was alert, oriented x5, with no evidence or self-report of active SI/HI or A/V H.  Christopher Fernandez reported compliance with medication and denied use of alcohol or illicit substances.  Christopher Fernandez reported scores of 3/10 for depression, 7/10 for anxiety, and 1/10 for anger/irritability.  Christopher Fernandez denied any recent outbursts or panic attacks.  Christopher Fernandez reported that a recent success was getting out of the house to do some shopping with his wife, as well as making some phone calls to set up post discharge therapy appointments.  Christopher Fernandez denied any new struggles.  Christopher Fernandez reported that his goal today is to make calls and do more research on setting up therapy.         Second Therapeutic Activity: Counselor introduced  Christopher Fernandez, Christopher Fernandez to provide psychoeducation on topic of Grief and Loss with members today.  Christopher Fernandez began discussion by checking in with the group about their baseline mood today, general thoughts on what grief means to them and how it has affected them personally in the past.  Christopher Fernandez provided information on how the process of grief/loss can differ depending upon one's unique culture, and categories of loss one could experience (i.e. loss of a person, animal, relationship, job, identity, etc).  Christopher Fernandez encouraged members to be mindful of how pervasive loss can be, and how to recognize signs which could indicate that this is having an impact on one's overall mental health and wellbeing.  Intervention was effective, as evidenced by Christopher Fernandez reporting that although he is not experiencing grief at this time, he plans to help cope with this in the future by drawing strength from people he loves, such as his higher power, wife, and cat named Christopher Fernandez.     Third Therapeutic Activity:  Counselor introduced topic of 'urge surfing' today.  Counselor explained how this technique can be used to avoid acting upon a behavior that needs to be reduced or stopped completely.  Counselor provided common examples of maladaptive behaviors, such as smoking, overeating, substance use, excessive spending, lashing out emotionally, and more.  Counselor explained how urges rarely last more than 30 minutes if they are not ruminated upon, and attempts to fight or suppress the urge can ultimately cause the problem to grow.  Counselor guided members through practice of combination mindfulness, relaxation, and visualization exercises in order to surf an urge of their concern until it faded.  Intervention was  effective, as evidenced by Christopher Fernandez participating in urge surfing activity and reporting that he focused on the urge to smoke cigarettes and drink coffee, which did subside after focusing on exercise.  Christopher Fernandez stated  Visualization tends to be tough, but the more I do it, the better I get with it. I could see the waves, and that was calming.  The urge completely disappeared.     Assessment and Plan: Counselor recommends that Christopher Fernandez remain in IOP treatment to better manage mental health symptoms, ensure stability and pursue completion of treatment plan goals. Counselor recommends adherence to crisis/safety plan, taking medications as prescribed, and following up with medical professionals if any issues arise.   Follow Up Instructions: Counselor will send Webex link for next session. Christopher Fernandez was advised to call back or seek an in-person evaluation if the symptoms worsen or if the condition fails to improve as anticipated.   I provided 180 minutes of non-face-to-face time during this encounter.   [name], LCSW, LCAS 10/28/21

## 2021-10-29 ENCOUNTER — Other Ambulatory Visit (HOSPITAL_COMMUNITY): Payer: 59 | Admitting: Licensed Clinical Social Worker

## 2021-10-29 ENCOUNTER — Other Ambulatory Visit: Payer: Self-pay

## 2021-10-29 DIAGNOSIS — R454 Irritability and anger: Secondary | ICD-10-CM | POA: Diagnosis not present

## 2021-10-29 DIAGNOSIS — F1910 Other psychoactive substance abuse, uncomplicated: Secondary | ICD-10-CM | POA: Diagnosis not present

## 2021-10-29 DIAGNOSIS — F329 Major depressive disorder, single episode, unspecified: Secondary | ICD-10-CM

## 2021-10-29 DIAGNOSIS — Z79899 Other long term (current) drug therapy: Secondary | ICD-10-CM | POA: Diagnosis not present

## 2021-10-29 DIAGNOSIS — F419 Anxiety disorder, unspecified: Secondary | ICD-10-CM | POA: Diagnosis not present

## 2021-10-29 DIAGNOSIS — G479 Sleep disorder, unspecified: Secondary | ICD-10-CM | POA: Diagnosis not present

## 2021-10-29 DIAGNOSIS — F1090 Alcohol use, unspecified, uncomplicated: Secondary | ICD-10-CM | POA: Diagnosis not present

## 2021-10-29 NOTE — Progress Notes (Signed)
SUBJECTIVE:   CHIEF COMPLAINT / HPI:   Colonoscopy follow up: Previously attempted colonoscopy was not prepped even with 2-day prep.  Per GIs note they are going to reschedule for first available in March, April, or May.  Diabetic Follow Up: Patient is a 51 y.o. male who present today for diabetic follow up.   Patient endorses no problems  Home medications include: insulin glargine 10u daily, sliding scale with meals, metformin 1000bid, januvia 100mg  daily, jardiance 25mg  daily. Patient endorses taking these medications as prescribed.  Most recent A1Cs:  Lab Results  Component Value Date   HGBA1C 7.8 (A) 06/27/2021   HGBA1C 7.7 (A) 04/01/2021   HGBA1C 9.1 (H) 12/27/2020   Last Microalbumin, LDL, Creatinine: Lab Results  Component Value Date   LDLCALC 34 10/23/2020   CREATININE 0.92 10/14/2021   Patient does check blood glucose on a regular basis. Lowest he has seen on fasting sugars in 140.  Patient is up to date on diabetic eye. Patient is up to date on diabetic foot exam.  Back pain: Has flares from time to time. Had some worse pain recently and followed with his ID doctor and says everything looked good from an osteomyelitis standpoint. He states occasionally he will request a week or two of pain medications when he has flares.  No fevers or chills.  Right foot pain: Has been hurting about 8 days, no known injury.  Seems to be present in the arch of the right foot.  He describes it as an aching sensation that is there constantly.  He denies any injury or redness to the skin.  PERTINENT  PMH / PSH: T2DM  OBJECTIVE:   BP 119/84    Pulse 79    Ht 6\' 3"  (1.905 m)    Wt 223 lb (101.2 kg)    BMI 27.87 kg/m    General: NAD, pleasant, able to participate in exam Cardiac: RRR, no murmurs. Respiratory: CTAB, normal effort, No wheezes, rales or rhonchi Abdomen: Bowel sounds present, nontender, nondistended, no hepatosplenomegaly. MSK: Back: No tenderness to palpation  of the midline spine.  He does have an area of tenderness in the rhomboid musculature on the left side midthoracic region.  This is not isolated to any rib and is lateral from the vertebra. 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  No erythema or swelling.  He does have an area of discomfort in the center of his arch.  No overlying injury or erythema.  No tenderness or discomfort with movement of the ankle and he has full range of motion of it.  No warmth to the joint or area of discomfort. Skin: warm and dry, no rashes noted Neuro: alert, no obvious focal deficits Psych: Normal affect and mood  ASSESSMENT/PLAN:   Type 2 diabetes: A1c today of 7.8.  Continues on insulin, Jardiance, metformin, Januvia.  Recommended him feeling confident about titrating his nightly long-acting insulin in order to better improve his morning sugars as he has been seeing some morning sugars above 200.  Recommended increasing by 2 units if his morning sugar is above 200 and decreasing by 2 units if it is less than 120, he is going to call and let us know if he ever sees it less than 100.  Otherwise continue current medications.  A1c in 3 months.  Back pain: Located in the rhomboid musculature on the left side mid thoracic region.  No pain with palpation and no complaints of pain in the bony  structures.  No pain with palpation of any of the isolated ribs.  Has been going on about 8 days.  No known injury.  I have low concern for an infectious etiology such as new osteomyelitis.  I think this is most likely due to the rhomboid muscle.  I discussed using Voltaren gel and lidocaine patches as well as stretching.  Recommended that if it continues to be a problem we can always get him in with physical therapy.  He is going let me know if he develops any fever, chills, significantly worsening pain.  Right foot pain: Located in the arch of the foot.  He has a previous history of a midfoot amputation.   Surgical scar is well-appearing.  The foot is not warm and has no erythema present.  He has an area of discomfort in the mid arch of the foot.  Think this is likely due to his gait and I recommended attempting to try some new shoes or orthotics to see if it either worsens or improves the pain.  If it either worsens or improves the pain is suggest that this is due to his arch.  He is going let me know if he develops any fevers or chills or worsening pain.  Low suspicion for diabetic neuropathy as a cause with his A1c of 7.8 but that is still a slight possibility.  Lurline Del, Odin

## 2021-10-29 NOTE — Progress Notes (Signed)
Virtual Visit via Video Note   I connected with Christopher Fernandez on 10/29/21 at  9:00 AM EDT by a video enabled telemedicine application and verified that I am speaking with the correct person using two identifiers.   At orientation to the IOP program, Case Manager discussed the limitations of evaluation and management by telemedicine and the availability of in person appointments. The patient expressed understanding and agreed to proceed with virtual visits throughout the duration of the program.   Location:  Patient: Patient Home Provider: [facility] [place] [facility]   History of Present Illness: MDD with anxious distress    Observations/Objective: Check In: Case Manager checked in with all participants to review discharge dates, insurance authorizations, work-related documents and needs from the treatment team regarding medications. Christopher Fernandez stated needs and engaged in discussion.    Initial Therapeutic Activity: Counselor facilitated a check-in with Christopher Fernandez to assess for safety, sobriety and medication compliance.  Counselor also inquired about Christopher Fernandez's current emotional ratings, as well as any significant changes in thoughts, feelings or behavior since previous check in.  Christopher Fernandez presented for session on time and was alert, oriented x5, with no evidence or self-report of active SI/HI or A/V H.  Christopher Fernandez reported compliance with medication and denied use of alcohol or illicit substances.  Christopher Fernandez reported scores of 3/10 for depression, 7/10 for anxiety, and 2/10 for anger/irritability.  Christopher Fernandez denied any recent outbursts or panic attacks.  Christopher Fernandez reported that a recent success was been continuing to outreach therapists and psychiatrists in order to set up aftercare when he finishes [facility].  Christopher Fernandez was receptive to feedback from the group on providers that might have some availability.    Second Therapeutic Activity: Counselor introduced Christopher Fernandez, Christopher Fernandez, to provide psychoeducation on topic of  nutrition with members today.  Christopher Fernandez virtually shared a comprehensive PowerPoint presentation to guide discussion, which featured the various components of healthy living that influence one's wellbeing, including practicing mindfulness, staying physically active, calm in mood, well-rested, and more.  Christopher Fernandez explained how good nutrition reinforces positive physical and mental health, and shared a video which explained how food intake affects brain functioning in particular.  Christopher Fernandez provided advice on how to adjust diet in order to promote wellbeing during course of treatment, including achieving balanced daily intake along with regular exercise.  Christopher Fernandez offered the 'plate method' as a tool for proper distribution of protein, grains, starches, vegetables, fruit, and low calorie drink, in addition to concept of 'mindful eating', and covering current Dietary Guidelines for Americans for more tips.  Christopher Fernandez inquired about changes members would like to make to their nutrition in order to increase overall wellbeing based upon information shared today, and time was allowed to ask any questions they might have of Christopher Fernandez regarding her specialty.  Intervention effectiveness could not be measured, as Christopher Fernandez did not participate in discussion with speaker, although he was observed following along during presentation via video feed, and practicing stretching activity that was suggested.     Assessment and Plan: Counselor recommends that Christopher Fernandez remain in IOP treatment to better manage mental health symptoms, ensure stability and pursue completion of treatment plan goals. Counselor recommends adherence to crisis/safety plan, taking medications as prescribed, and following up with medical professionals if any issues arise.   Follow Up Instructions: Counselor will send Webex link for next session. Christopher Fernandez was advised to call back or seek an in-person evaluation if the symptoms worsen or if the condition fails to improve as anticipated.    I  provided 180 minutes of non-face-to-face time during this encounter.   Christopher Flood, LCSW, LCAS 10/29/21

## 2021-10-30 ENCOUNTER — Other Ambulatory Visit (HOSPITAL_COMMUNITY): Payer: 59 | Admitting: Psychiatry

## 2021-10-30 ENCOUNTER — Other Ambulatory Visit: Payer: Self-pay

## 2021-10-30 DIAGNOSIS — F419 Anxiety disorder, unspecified: Secondary | ICD-10-CM | POA: Diagnosis not present

## 2021-10-30 DIAGNOSIS — F1910 Other psychoactive substance abuse, uncomplicated: Secondary | ICD-10-CM | POA: Diagnosis not present

## 2021-10-30 DIAGNOSIS — G479 Sleep disorder, unspecified: Secondary | ICD-10-CM | POA: Diagnosis not present

## 2021-10-30 DIAGNOSIS — R454 Irritability and anger: Secondary | ICD-10-CM | POA: Diagnosis not present

## 2021-10-30 DIAGNOSIS — Z79899 Other long term (current) drug therapy: Secondary | ICD-10-CM | POA: Diagnosis not present

## 2021-10-30 DIAGNOSIS — F329 Major depressive disorder, single episode, unspecified: Secondary | ICD-10-CM

## 2021-10-30 DIAGNOSIS — F1090 Alcohol use, unspecified, uncomplicated: Secondary | ICD-10-CM | POA: Diagnosis not present

## 2021-10-30 NOTE — Progress Notes (Signed)
Virtual Visit via Video Note   I connected with [name] on 10/30/21 at  9:00 AM EDT by a video enabled telemedicine application and verified that I am speaking with the correct person using two identifiers.   At orientation to the IOP program, Case Manager discussed the limitations of evaluation and management by telemedicine and the availability of in person appointments. The patient expressed understanding and agreed to proceed with virtual visits throughout the duration of the program.   Location:  Patient: Patient Home Provider: [facility] [place] [facility]   History of Present Illness: MDD with anxious distress    Observations/Objective: Check In: Case Manager checked in with all participants to review discharge dates, insurance authorizations, work-related documents and needs from the treatment team regarding medications. Amedio stated needs and engaged in discussion.    Initial Therapeutic Activity: Counselor facilitated a check-in with Kyrie to assess for safety, sobriety and medication compliance.  Counselor also inquired about Joseth's current emotional ratings, as well as any significant changes in thoughts, feelings or behavior since previous check in.  Omarie presented for session on time and was alert, oriented x5, with no evidence or self-report of active SI/HI or A/V H.  Zhyon reported compliance with medication and denied use of alcohol or illicit substances.  Landan reported scores of 8/10 for depression, 8/10 for anxiety, and 3/10 for anger/irritability.  Ules denied any recent outbursts or panic attacks.  Deo denied any successes over past day, stating I'm operating at a higher level of frustration today.  He declined to go into detail about this, but reported that he will plan to attend church in person this weekend to increase support as he tries to cope with present challenges.    Second Therapeutic Activity: Counselor introduced topic of assertive communication  today.  Counselor shared various handouts with members virtually in group to read along with on the subject.  These handouts defined assertive communication as a communication style in which a person stands up for their own needs and wants, while also taking into consideration the needs and wants of others, without behaving in a passive or aggressive way.  Traits of assertive communicators were highlighted such as using appropriate speaking volume, maintaining eye contact, using confident language, and avoiding interruption.  Members were also provided with tips on how to improve communication, including respecting oneself, expressing thoughts and feelings calmly, and saying No when necessary.  Members were given a variety of scenarios where they could practice using these tips to respond in an assertive manner.  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  Ronin reported that he is motivated to become a more assertive speaker to ensure that he can express needs and feelings to supports in a clearer manner, and displayed more effective use of assertive communication skills through engagement in roleplay activities today.      Assessment and Plan: Counselor recommends that [facility] remain in IOP treatment to better manage mental health symptoms, ensure stability and pursue completion of treatment plan goals. Counselor recommends adherence to crisis/safety plan, taking medications as prescribed, and following up with medical professionals if any issues arise.   Follow Up Instructions: Counselor will send Webex link for next session. Austine was advised to call back or seek an in-person evaluation if the symptoms worsen  or if the condition fails to improve as anticipated.    Collaboration of Care:   Medication Management AEB Ricky Ala, NP                                           Case Manager AEB Dellia Nims, CNA    Patient/Guardian was advised Release of Information must be obtained prior to any record release in order to collaborate their care with an outside provider. Patient/Guardian was advised if they have not already done so to contact the registration department to sign all necessary forms in order for [place] to release information regarding their care.   Consent: Patient/Guardian gives verbal consent for treatment and assignment of benefits for services provided during this visit. Patient/Guardian expressed understanding and agreed to proceed.  I provided 180 minutes of non-face-to-face time during this encounter.   [name], LCSW, LCAS 10/30/21

## 2021-10-31 ENCOUNTER — Encounter: Payer: Self-pay | Admitting: Family Medicine

## 2021-10-31 ENCOUNTER — Telehealth (HOSPITAL_COMMUNITY): Payer: Self-pay | Admitting: Psychiatry

## 2021-10-31 ENCOUNTER — Other Ambulatory Visit: Payer: Self-pay

## 2021-10-31 ENCOUNTER — Ambulatory Visit: Payer: 59 | Admitting: Family Medicine

## 2021-10-31 VITALS — BP 119/84 | HR 79 | Ht 75.0 in | Wt 223.0 lb

## 2021-10-31 DIAGNOSIS — E1140 Type 2 diabetes mellitus with diabetic neuropathy, unspecified: Secondary | ICD-10-CM

## 2021-10-31 DIAGNOSIS — M79671 Pain in right foot: Secondary | ICD-10-CM | POA: Diagnosis not present

## 2021-10-31 DIAGNOSIS — M546 Pain in thoracic spine: Secondary | ICD-10-CM

## 2021-10-31 DIAGNOSIS — Z794 Long term (current) use of insulin: Secondary | ICD-10-CM | POA: Diagnosis not present

## 2021-10-31 LAB — POCT GLYCOSYLATED HEMOGLOBIN (HGB A1C): HbA1c, POC (controlled diabetic range): 7.8 % — AB (ref 0.0–7.0)

## 2021-10-31 NOTE — Patient Instructions (Signed)
Your A1c today is 7.8.  I want you to focus on adjusting your long-acting insulin based off your morning sugars.  If you see sugars greater than 200 when you are fasting in the morning I will try to increase your insulin glargine by 2 units, if you see sugars under 120 I would reduce it by 2 units.  If you see sugars under 100 in the morning please reduce it by 2 to 5 units the next night and please let us know.  Recheck A1c in 3 months  For your foot pain I think this is likely due to your arches and I would recommend trying some arch supports or different shoes to see if that either makes the problem better or worse.  If it does not strongly suggest that is the cause.  We can always get you set up with sports medicine to get some custom orthotics developed if that does become the apparent cause.  For your back pain it seems to be located in the rhomboid muscle.  This muscle deals with posture and often gets aggravated with sleeping on a mattress as it gets older, sitting in the chair without great posture, or after a small back injury or sprain.  Continue working on stretching and flexibility work as well as using lidocaine patches and Voltaren gel on the area.  If it gets worse or does not get better please let me know.

## 2021-11-03 ENCOUNTER — Other Ambulatory Visit (HOSPITAL_COMMUNITY): Payer: 59 | Admitting: Licensed Clinical Social Worker

## 2021-11-03 ENCOUNTER — Other Ambulatory Visit: Payer: Self-pay

## 2021-11-03 DIAGNOSIS — F419 Anxiety disorder, unspecified: Secondary | ICD-10-CM | POA: Diagnosis not present

## 2021-11-03 DIAGNOSIS — F329 Major depressive disorder, single episode, unspecified: Secondary | ICD-10-CM

## 2021-11-03 DIAGNOSIS — R454 Irritability and anger: Secondary | ICD-10-CM | POA: Diagnosis not present

## 2021-11-03 DIAGNOSIS — Z79899 Other long term (current) drug therapy: Secondary | ICD-10-CM | POA: Diagnosis not present

## 2021-11-03 DIAGNOSIS — F1910 Other psychoactive substance abuse, uncomplicated: Secondary | ICD-10-CM | POA: Diagnosis not present

## 2021-11-03 DIAGNOSIS — F1090 Alcohol use, unspecified, uncomplicated: Secondary | ICD-10-CM | POA: Diagnosis not present

## 2021-11-03 DIAGNOSIS — G479 Sleep disorder, unspecified: Secondary | ICD-10-CM | POA: Diagnosis not present

## 2021-11-03 NOTE — Progress Notes (Signed)
Virtual Visit via Video Note   I connected with [name] on 11/03/21 at  9:00 AM EDT by a video enabled telemedicine application and verified that I am speaking with the correct person using two identifiers.   At orientation to the IOP program, Case Manager discussed the limitations of evaluation and management by telemedicine and the availability of in person appointments. The patient expressed understanding and agreed to proceed with virtual visits throughout the duration of the program.   Location:  Patient: Patient Home Provider: [facility] [place] [facility]   History of Present Illness: MDD with anxious distress    Observations/Objective: Check In: Case Manager checked in with all participants to review discharge dates, insurance authorizations, work-related documents and needs from the treatment team regarding medications. Jermall stated needs and engaged in discussion.    Initial Therapeutic Activity: Counselor facilitated a check-in with Fabion to assess for safety, sobriety and medication compliance.  Counselor also inquired about Gaudencio's current emotional ratings, as well as any significant changes in thoughts, feelings or behavior since previous check in.  Jadd presented for session on time and was alert, oriented x5, with no evidence or self-report of active SI/HI or A/V H.  Preet reported compliance with medication and denied use of alcohol or illicit substances.  Allen reported scores of 4/10 for depression, 8/10 for anxiety, and 3/10 for anger/irritability.  Virgal denied any recent outbursts or panic attacks.  Lelan reported that a recent success was attending church virtually over the weekend.  [name] reported that a current struggle is securing an appointment for aftercare therapy and psychiatry services, stating I'm made a lot of calls but haven't heard anything back.  Counselor and group members offered suggestions for providers that might have availability, and he was  receptive to these, stating I'm going to keep making an effort.      Second Therapeutic Activity: Counselor provided psychoeducation on subject of boundaries with group members today using a virtual handout.  This handout defined boundaries as the limits and rules that we set for ourselves within relationships, and featured a breakdown of the 3 common categories of boundaries (i.e. porous, rigid, and healthy), along with typical traits specific to each one for easy identification.  It was noted that most people have a mixture of different boundary types depending on setting, person, and culture.  Additional information was provided on the types of boundaries (i.e. physical, intellectual, emotional, sexual, material, and time) within relationships, and what could be considered healthy versus unhealthy. Counselor tasked members with identifying what types of boundaries they presently hold within her own support systems, the collective impact these boundaries have upon their mental health, and changes that could be made in order to more effectively communicate individual mental health needs.  Intervention was effective, as evidenced by [name] participating in discussion on topic, reporting that based upon this information, he believes that he has rigid boundaries in most relationships in his life due to traits such as avoiding intimacy and close relationships, very protective of personal information, seeming detached from others, and keeping others at a distance to avoid rejection.  Gasper reported that due to these rigid boundaries, he has few connections outside of direct family, and his social skills have suffered.  Kelechi reported that being in group therapy has helped him strengthen communication, learn to speak up more about his feelings and make new connections.  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    Assessment and Plan: Counselor recommends that [facility] remain in IOP treatment to better manage mental health symptoms, ensure stability and pursue completion of treatment plan goals. Counselor recommends adherence to crisis/safety plan, taking medications as prescribed, and following up with medical professionals if any issues arise.   Follow Up Instructions: Counselor will send Webex link for next session. Aldyn was advised to call back or seek an in-person evaluation if the symptoms worsen or if the condition fails to improve as anticipated.   Collaboration of Care:   Medication Management AEB Ricky Ala, NP                                           Case Manager AEB Dellia Nims, CNA   Patient/Guardian was advised Release of Information must be obtained prior to any record release in order to collaborate their care with an outside provider. Patient/Guardian was advised if they have not already done so to contact the registration department to sign all necessary forms in order for [place] to release information regarding their care.   Consent: Patient/Guardian gives verbal consent for treatment and assignment of benefits for services provided during this visit. Patient/Guardian expressed understanding and agreed to proceed.  I provided 180 minutes of non-face-to-face time during this encounter.   [name], LCSW, LCAS 11/03/21

## 2021-11-04 ENCOUNTER — Other Ambulatory Visit (HOSPITAL_COMMUNITY): Payer: 59 | Admitting: Licensed Clinical Social Worker

## 2021-11-04 DIAGNOSIS — Z79899 Other long term (current) drug therapy: Secondary | ICD-10-CM | POA: Diagnosis not present

## 2021-11-04 DIAGNOSIS — R454 Irritability and anger: Secondary | ICD-10-CM | POA: Diagnosis not present

## 2021-11-04 DIAGNOSIS — G479 Sleep disorder, unspecified: Secondary | ICD-10-CM | POA: Diagnosis not present

## 2021-11-04 DIAGNOSIS — F1090 Alcohol use, unspecified, uncomplicated: Secondary | ICD-10-CM | POA: Diagnosis not present

## 2021-11-04 DIAGNOSIS — F1910 Other psychoactive substance abuse, uncomplicated: Secondary | ICD-10-CM | POA: Diagnosis not present

## 2021-11-04 DIAGNOSIS — F329 Major depressive disorder, single episode, unspecified: Secondary | ICD-10-CM | POA: Diagnosis not present

## 2021-11-04 DIAGNOSIS — F419 Anxiety disorder, unspecified: Secondary | ICD-10-CM | POA: Diagnosis not present

## 2021-11-04 NOTE — Progress Notes (Addendum)
Virtual Visit via Video Note   I connected with Christopher Fernandez on 11/04/21 at  9:00 AM EDT by a video enabled telemedicine application and verified that I am speaking with the correct person using two identifiers.   At orientation to the IOP program, Case Manager discussed the limitations of evaluation and management by telemedicine and the availability of in person appointments. The patient expressed understanding and agreed to proceed with virtual visits throughout the duration of the program.   Location:  Patient: Patient Home Provider: [facility] [place] Office   History of Present Illness: MDD with anxious distress    Observations/Objective: Check In: Case Manager checked in with all participants to review discharge dates, insurance authorizations, work-related documents and needs from the treatment team regarding medications. Christopher Fernandez stated needs and engaged in discussion.    Initial Therapeutic Activity: Counselor facilitated a check-in with Christopher Fernandez to assess for safety, sobriety and medication compliance.  Counselor also inquired about Christopher Fernandez's current emotional ratings, as well as any significant changes in thoughts, feelings or behavior since previous check in.  Christopher Fernandez presented for session on time and was alert, oriented x5, with no evidence or self-report of active SI/HI or A/V H.  Christopher Fernandez reported compliance with medication and denied use of alcohol or illicit substances.  Christopher Fernandez reported scores of 5/10 for depression, 5/10 for anxiety, and 2/10 for anger/irritability.  Christopher Fernandez denied any recent outbursts or panic attacks.  Christopher Fernandez reported that a recent success was spending time with his wife yesterday running errands, speaking with his father, and outreaching a Optician, dispensing for an appointment.  Christopher Fernandez denied any new struggles.  Christopher Fernandez reported that his goal today is to Christopher Fernandez about starting their support groups.    Second Therapeutic Activity: Counselor  introduced [employer], [facility] Chaplain to provide psychoeducation on topic of Grief and Loss with members today.  Christopher Fernandez began discussion by checking in with the group about their baseline mood today, general thoughts on what grief means to them and how it has affected them personally in the past.  Christopher Fernandez provided information on how the process of grief/loss can differ depending upon one's unique culture, and categories of loss one could experience (i.e. loss of a person, animal, relationship, job, identity, etc).  Christopher Fernandez encouraged members to be mindful of how pervasive loss can be, and how to recognize signs which could indicate that this is having an impact on one's overall mental health and wellbeing.  Intervention was effective, as evidenced by Christopher Fernandez participating in discussion with speaker on the subject, reporting that he has spent a large portion of his life attempting to conceal feelings since this was expected of him by his [place] father.  Christopher Fernandez reported that he relied on alcohol and drugs to mask these feelings for a time and appear strong, but he is now more motivated to practice healthier approaches for processing difficult feelings such as grief through prayer and connection to his higher power for guidance.    Assessment and Plan: Counselor recommends that Christopher Fernandez remain in IOP treatment to better manage mental health symptoms, ensure stability and pursue completion of treatment plan goals. Counselor recommends adherence to crisis/safety plan, taking medications as prescribed, and following up with medical professionals if any issues arise.   Follow Up Instructions: Counselor will send Webex link for next session. Christopher Fernandez was advised to call back or seek an in-person evaluation if the symptoms worsen or if the condition fails to improve as anticipated.   Collaboration of Care:  Medication Management AEB Ricky Ala, NP                                           Case Manager AEB  Dellia Nims, CNA   Patient/Guardian was advised Release of Information must be obtained prior to any record release in order to collaborate their care with an outside provider. Patient/Guardian was advised if they have not already done so to contact the registration department to sign all necessary forms in order for [place] to release information regarding their care.   Consent: Patient/Guardian gives verbal consent for treatment and assignment of benefits for services provided during this visit. Patient/Guardian expressed understanding and agreed to proceed.  I provided 180 minutes of non-face-to-face time during this encounter.   [name], LCSW, LCAS 11/04/21

## 2021-11-05 ENCOUNTER — Other Ambulatory Visit (HOSPITAL_COMMUNITY): Payer: 59 | Admitting: Psychiatry

## 2021-11-05 ENCOUNTER — Other Ambulatory Visit: Payer: Self-pay

## 2021-11-05 DIAGNOSIS — F329 Major depressive disorder, single episode, unspecified: Secondary | ICD-10-CM | POA: Diagnosis not present

## 2021-11-05 DIAGNOSIS — Z79899 Other long term (current) drug therapy: Secondary | ICD-10-CM | POA: Diagnosis not present

## 2021-11-05 DIAGNOSIS — F1090 Alcohol use, unspecified, uncomplicated: Secondary | ICD-10-CM | POA: Diagnosis not present

## 2021-11-05 DIAGNOSIS — F419 Anxiety disorder, unspecified: Secondary | ICD-10-CM | POA: Diagnosis not present

## 2021-11-05 DIAGNOSIS — F1910 Other psychoactive substance abuse, uncomplicated: Secondary | ICD-10-CM | POA: Diagnosis not present

## 2021-11-05 DIAGNOSIS — G479 Sleep disorder, unspecified: Secondary | ICD-10-CM | POA: Diagnosis not present

## 2021-11-05 DIAGNOSIS — R454 Irritability and anger: Secondary | ICD-10-CM | POA: Diagnosis not present

## 2021-11-05 NOTE — Progress Notes (Signed)
Virtual Visit via Video Note   I connected with [name] on 11/05/21 at  9:00 AM EDT by a video enabled telemedicine application and verified that I am speaking with the correct person using two identifiers.   At orientation to the IOP program, Case Manager discussed the limitations of evaluation and management by telemedicine and the availability of in person appointments. The patient expressed understanding and agreed to proceed with virtual visits throughout the duration of the program.   Location:  Patient: Patient Home Provider: [facility] [place] [facility]   History of Present Illness: MDD with anxious distress    Observations/Objective: Check In: Case Manager checked in with all participants to review discharge dates, insurance authorizations, work-related documents and needs from the treatment team regarding medications. Jeremy stated needs and engaged in discussion.    Initial Therapeutic Activity: Counselor facilitated a check-in with Filbert to assess for safety, sobriety and medication compliance.  Counselor also inquired about Aiven's current emotional ratings, as well as any significant changes in thoughts, feelings or behavior since previous check in.  Cristian presented for session on time and was alert, oriented x5, with no evidence or self-report of active SI/HI or A/V H.  Will reported compliance with medication and denied use of alcohol or illicit substances.  Culver reported scores of 4/10 for depression, 7/10 for anxiety, and 3/10 for anger/irritability.  Sultan denied any recent outbursts or panic attacks.  Shenouda reported that his success today is completing MHIOP and making plans to continue care following discharge due to benefits noted from trying group therapy.  Dayson denied any present struggles.  He reported that his goal today is to continue looking into local therapists that can offer a Christian approach, since he feels like this would be helpful.      Second  Therapeutic Activity: Counselor introduced [name], [name], to provide psychoeducation on topic of medication compliance with members today.  [name] provided psychoeducation on classes of medications such as antidepressants, antipsychotics, what symptoms they are intended to treat, and any side effects one might encounter while on a particular prescription.  Time was allowed for clients to ask any questions they might have of [facility] regarding this specialty.  Intervention was effective, as evidenced by [name] participating in discussion with speaker on the subject, and inquiring about how a provider determines the appropriate dosage to prescribe a patient, since he is on several different behavioral medications, and wondered how they might interact.  He was receptive to clarity from the pharmacist on how providers safely reach a therapeutic dose with their patients.    Third Therapeutic Activity: Counselor introduced topic of creating mental health maintenance plan today.  Counselor provided handout on subject to members, which stressed the importance of maintaining one's mental health in a similar way to using diet and exercise to ensure physical health.  Counselor walked members through process of identifying triggers which could worsen symptoms, including specific people, places, and things one needs to avoid.  Members were also tasked with identifying warning signs such as thoughts, feelings, or behaviors which could indicate mental health is at increased risk.  Counselor also facilitated conversation on self-care activities and coping strategies which members have previously utilized in the past, are currently using in daily routine, or plan to use soon to assist with managing problems or symptoms when/if they appear.  Counselor encouraged members to revisit their maintenance plan often and make changes as needed to ensure day to day stability.  Intervention was  effective, as evidenced by [name]  participating in activity and creating a comprehensive plan, including identification of triggers such as having disagreements with family members, or feeling anger, disappointment, or self-loathing towards himself, as well as warning signs including insomnia, irritable mood, isolation from others, and increased outbursts.  Rasaan also reported that he would make an effort to set aside time for self-care activities such as reading the bible, attending bible study groups, calling family members that live far away to check in, or bird watching and use coping skills such as doing a daily 'self check in' to get in touch with his feelings, pray, and use deep breathing to manage stressors.    Assessment and Plan: Daron has completed MHIOP at this time and will be discharging from group today.  He reported that he has set an initial appointment to meet with a psychiatrist soon, will continue to look for an appropriate Christian based therapist, and attend community support groups through [facility].  Counselor recommends adherence to crisis/safety plan, taking medications as prescribed, and following up with medical professionals if any issues arise.   Follow Up Instructions: Tung was advised to call back or seek an in-person evaluation if the symptoms worsen or if the condition fails to improve as anticipated.   Collaboration of Care:   Medication Management AEB Ricky Ala, NP                                           Case Manager AEB Dellia Nims, CNA   Patient/Guardian was advised Release of Information must be obtained prior to any record release in order to collaborate their care with an outside provider. Patient/Guardian was advised if they have not already done so to contact the registration department to sign all necessary forms in order for [place] to release information regarding their care.   Consent: Patient/Guardian gives verbal consent for treatment and assignment of benefits for services  provided during this visit. Patient/Guardian expressed understanding and agreed to proceed.  I provided 180 minutes of non-face-to-face time during this encounter.   [name], LCSW, LCAS 11/05/21

## 2021-11-05 NOTE — Patient Instructions (Signed)
D:  Patient completed MH-IOP today.  A:  Discharge today.  Follow up with Dr. Altamese Worley on 11-19-21 @ 2pm.  Address:  [address] [place], [place].  [phone].  Strongly encouraged pt to follow up with AA mtgs and [facility] [phone].  R:  Patient receptive.

## 2021-11-05 NOTE — Progress Notes (Signed)
Virtual Visit via Video Note  I connected with Christopher Fernandez on @TODAY @ at  9:00 AM EST by a video enabled telemedicine application and verified that I am speaking with the correct person using two identifiers.  Location: Patient: at home Provider: at office   I discussed the limitations of evaluation and management by telemedicine and the availability of in person appointments. The patient expressed understanding and agreed to proceed.  I discussed the assessment and treatment plan with the patient. The patient was provided an opportunity to ask questions and all were answered. The patient agreed with the plan and demonstrated an understanding of the instructions.   The patient was advised to call back or seek an in-person evaluation if the symptoms worsen or if the condition fails to improve as anticipated.  I provided 20 minutes of non-face-to-face time during this encounter.   Christopher Fernandez, M.Ed,CNA   Patient ID: Christopher Fernandez, male   DOB: 1971-06-30, 51 y.o.   MRN: [id number] As previous CCA states:  "I'm having quality of life is terrible, always stay at home, dont have friends except my cat, just knowing my history of alcohol and drungs recent. I'll go through periods of drinking, little drug abuse in the past few years. Every few weeks I'll still get plastered on alcohol. SI within past year, attepted 30 years ago, nothing recent. Multiple health issues as a result of drugs and alcohol. My marriage sucks my wife is a gambling addict so i know we have codependency issues gong on there so i want to be a whole person and fully functioning adult (unemployed 4 years financially dependent). Hx 5 long term SUD progrsms (6 months plus) sever 30 day programs, 50 detox, NA/AA off and on longest engagement with sobriety 1 year twice. Previously sober for 4 years after one program. Client reported medications currently not helpful for anxiety and depression. Client has decreased in functioning over  years. Per checklist depression, anxiety, insominia, racing thoughts." Pt started MH-IOP today. States he is hoping that the group will be beneficial.  Pt was active in all the groups today.  Denies SI/HI or A/V hallucinations.  Denies any drugs/ETOH.  Pt completed MH-IOP today.  States he feels more empowered, confident and invigorated.  Reports decreased sadness and isolation.  "I go out a little more with my wife now."  Pt states he has maintained his sobriety.  On a scale of 1-10 (10 being the worst); pt rates his depression at a 4 and anxiety at a 8.  Denies SI/HI or A/V hallucinations.  Pt has interests in [place] group and peer support through [facility] [employer].  A:  D/C today.  F/U with Dr. Altamese Pettus on 11-19-21 @ 2pm and [facility].  Pt is interested in obtaining a therapist.  Encouraged pt to discuss with Dr. Altamese [place] or he can contact his insurance company.  Strongly recommended AA groups and/or obtaining a sponsor.  R:  Pt receptive.  Christopher Fernandez, M.Ed,CNA

## 2021-11-06 ENCOUNTER — Encounter (HOSPITAL_COMMUNITY): Payer: Self-pay | Admitting: Family

## 2021-11-06 NOTE — Progress Notes (Signed)
°  [facility] Discharge Summary  Christopher Fernandez [id number]  Admission date: 10/10/2021 Discharge date: 11/05/2021  Reason for admission: Per admission assessment note: Christopher Fernandez is a 51 year old male was referred due to depression and anxiety.  States he is followed by an office therapist K. Brone.  He reports stressors related to substance abuse history.  Reports recent relapse however states, I know I need to get my mental health under wraps.  He reports mood irritability and difficulty sleeping at night.  Discussed initiating hydroxyzine and patient may benefit from a dual diagnosis programming for depression and alcohol use.  Chatting with receptive to plan.  Plans to follow-up with additional outpatient resources.   Chemical Use History: Per charted history -"Hx 5 long term SUD progrsms (6 months plus) sever 30 day programs, 50 detox, NA/AA off and on longest engagement with sobriety 1 year twice. Previously sober for 4 years after one program.  Progress in Program Toward Treatment Goals: Progressing-patient attended and participated with daily group session with active and engaged participation.  No reports of suicidal or homicidal ideations.  Treatment team reported " great group member" patient appears to be progressing.  No medication refills noted at discharge  Progress (rationale): Follow-up with [facility]04/2022 at 1400  Take all medications as prescribed. Keep all follow-up appointments as scheduled.  Do not consume alcohol or use illegal drugs while on prescription medications. Report any adverse effects from your medications to your primary care provider promptly.  In the event of recurrent symptoms or worsening symptoms, call 911, a crisis hotline, or go to the nearest emergency department for evaluation.     Derrill Center, NP 11/06/2021

## 2021-11-07 ENCOUNTER — Other Ambulatory Visit (HOSPITAL_COMMUNITY): Payer: Self-pay

## 2021-11-07 ENCOUNTER — Other Ambulatory Visit: Payer: Self-pay | Admitting: Family Medicine

## 2021-11-07 DIAGNOSIS — E1142 Type 2 diabetes mellitus with diabetic polyneuropathy: Secondary | ICD-10-CM

## 2021-11-07 MED ORDER — GABAPENTIN 300 MG PO CAPS
900.0000 mg | ORAL_CAPSULE | Freq: Three times a day (TID) | ORAL | 2 refills | Status: DC
  Filled 2021-11-07: qty 270, 30d supply, fill #0
  Filled 2021-12-15: qty 270, 30d supply, fill #1

## 2021-11-07 MED ORDER — HYDROXYZINE PAMOATE 25 MG PO CAPS
25.0000 mg | ORAL_CAPSULE | Freq: Every evening | ORAL | 1 refills | Status: DC | PRN
  Filled 2021-11-07: qty 30, 30d supply, fill #0

## 2021-11-07 NOTE — Addendum Note (Signed)
Addended by: Derrill Center on: 11/07/2021 11:44 AM   Modules accepted: Orders

## 2021-11-12 ENCOUNTER — Other Ambulatory Visit (HOSPITAL_COMMUNITY): Payer: Self-pay

## 2021-11-12 MED FILL — Albuterol Sulfate Inhal Aero 108 MCG/ACT (90MCG Base Equiv): RESPIRATORY_TRACT | 17 days supply | Qty: 18 | Fill #5 | Status: AC

## 2021-11-18 ENCOUNTER — Telehealth: Payer: Self-pay | Admitting: Gastroenterology

## 2021-11-18 ENCOUNTER — Encounter: Payer: Self-pay | Admitting: Family Medicine

## 2021-11-18 ENCOUNTER — Other Ambulatory Visit: Payer: Self-pay | Admitting: Family Medicine

## 2021-11-18 ENCOUNTER — Other Ambulatory Visit (HOSPITAL_COMMUNITY): Payer: Self-pay

## 2021-11-18 DIAGNOSIS — G4700 Insomnia, unspecified: Secondary | ICD-10-CM

## 2021-11-18 DIAGNOSIS — F418 Other specified anxiety disorders: Secondary | ICD-10-CM

## 2021-11-18 MED ORDER — CYCLOBENZAPRINE HCL 10 MG PO TABS
10.0000 mg | ORAL_TABLET | Freq: Three times a day (TID) | ORAL | 2 refills | Status: DC
  Filled 2021-11-18: qty 90, 30d supply, fill #0
  Filled 2021-12-15: qty 90, 30d supply, fill #1

## 2021-11-18 MED ORDER — MIRTAZAPINE 30 MG PO TABS
30.0000 mg | ORAL_TABLET | Freq: Every day | ORAL | 3 refills | Status: DC
  Filled 2021-11-18: qty 90, 90d supply, fill #0

## 2021-11-18 MED ORDER — DULOXETINE HCL 60 MG PO CPEP
60.0000 mg | ORAL_CAPSULE | Freq: Every day | ORAL | 2 refills | Status: DC
  Filled 2021-11-18: qty 90, 90d supply, fill #0

## 2021-11-18 NOTE — Telephone Encounter (Signed)
4-20 instructions sent via my chart and mailed as well  ?

## 2021-11-18 NOTE — Telephone Encounter (Signed)
Patient rescheduled colonoscopy to 01/01/22 in the afternoon requested new prep op instructions. ?

## 2021-11-19 ENCOUNTER — Other Ambulatory Visit (HOSPITAL_COMMUNITY): Payer: Self-pay

## 2021-11-19 DIAGNOSIS — F419 Anxiety disorder, unspecified: Secondary | ICD-10-CM | POA: Diagnosis not present

## 2021-11-19 DIAGNOSIS — F331 Major depressive disorder, recurrent, moderate: Secondary | ICD-10-CM | POA: Diagnosis not present

## 2021-11-19 DIAGNOSIS — F1994 Other psychoactive substance use, unspecified with psychoactive substance-induced mood disorder: Secondary | ICD-10-CM | POA: Diagnosis not present

## 2021-11-19 MED ORDER — QUETIAPINE FUMARATE 50 MG PO TABS
50.0000 mg | ORAL_TABLET | Freq: Every evening | ORAL | 0 refills | Status: DC
  Filled 2021-11-19: qty 30, 30d supply, fill #0

## 2021-11-19 MED ORDER — NALTREXONE HCL 50 MG PO TABS
50.0000 mg | ORAL_TABLET | Freq: Every day | ORAL | 0 refills | Status: DC
  Filled 2021-11-19: qty 30, 30d supply, fill #0

## 2021-11-21 ENCOUNTER — Other Ambulatory Visit (HOSPITAL_COMMUNITY): Payer: Self-pay

## 2021-12-03 ENCOUNTER — Other Ambulatory Visit (HOSPITAL_COMMUNITY): Payer: Self-pay

## 2021-12-03 DIAGNOSIS — F1994 Other psychoactive substance use, unspecified with psychoactive substance-induced mood disorder: Secondary | ICD-10-CM | POA: Diagnosis not present

## 2021-12-03 DIAGNOSIS — F419 Anxiety disorder, unspecified: Secondary | ICD-10-CM | POA: Diagnosis not present

## 2021-12-03 DIAGNOSIS — F331 Major depressive disorder, recurrent, moderate: Secondary | ICD-10-CM | POA: Diagnosis not present

## 2021-12-03 MED ORDER — QUETIAPINE FUMARATE 150 MG PO TABS
1.0000 | ORAL_TABLET | Freq: Every day | ORAL | 0 refills | Status: DC
  Filled 2021-12-03 – 2021-12-04 (×2): qty 30, 30d supply, fill #0

## 2021-12-04 ENCOUNTER — Other Ambulatory Visit: Payer: Self-pay | Admitting: Family Medicine

## 2021-12-04 ENCOUNTER — Other Ambulatory Visit (HOSPITAL_COMMUNITY): Payer: Self-pay

## 2021-12-04 MED ORDER — QUETIAPINE FUMARATE 100 MG PO TABS
100.0000 mg | ORAL_TABLET | Freq: Every day | ORAL | 0 refills | Status: DC
  Filled 2021-12-04: qty 30, 30d supply, fill #0

## 2021-12-04 MED ORDER — QUETIAPINE FUMARATE 50 MG PO TABS
50.0000 mg | ORAL_TABLET | Freq: Every day | ORAL | 0 refills | Status: DC
Start: 2021-12-04 — End: 2022-01-12
  Filled 2021-12-04 – 2021-12-16 (×2): qty 30, 30d supply, fill #0

## 2021-12-04 MED ORDER — FLUTICASONE-SALMETEROL 250-50 MCG/ACT IN AEPB
1.0000 | INHALATION_SPRAY | Freq: Two times a day (BID) | RESPIRATORY_TRACT | 2 refills | Status: DC
  Filled 2021-12-04: qty 60, 30d supply, fill #0

## 2021-12-05 ENCOUNTER — Other Ambulatory Visit (HOSPITAL_COMMUNITY): Payer: Self-pay

## 2021-12-05 MED ORDER — HYDROXYZINE HCL 25 MG PO TABS
25.0000 mg | ORAL_TABLET | Freq: Every evening | ORAL | 0 refills | Status: DC
Start: 2021-12-05 — End: 2022-01-12
  Filled 2021-12-05: qty 30, 30d supply, fill #0

## 2021-12-15 ENCOUNTER — Other Ambulatory Visit (HOSPITAL_COMMUNITY): Payer: Self-pay

## 2021-12-15 ENCOUNTER — Other Ambulatory Visit: Payer: Self-pay | Admitting: Family Medicine

## 2021-12-15 DIAGNOSIS — E1165 Type 2 diabetes mellitus with hyperglycemia: Secondary | ICD-10-CM

## 2021-12-15 MED ORDER — EMPAGLIFLOZIN 25 MG PO TABS
25.0000 mg | ORAL_TABLET | Freq: Every day | ORAL | 3 refills | Status: DC
  Filled 2021-12-15: qty 90, 90d supply, fill #0

## 2021-12-15 MED ORDER — SITAGLIPTIN PHOSPHATE 100 MG PO TABS
100.0000 mg | ORAL_TABLET | Freq: Every day | ORAL | 10 refills | Status: DC
  Filled 2021-12-15: qty 30, 30d supply, fill #0

## 2021-12-16 ENCOUNTER — Other Ambulatory Visit (HOSPITAL_COMMUNITY): Payer: Self-pay

## 2021-12-17 ENCOUNTER — Other Ambulatory Visit (HOSPITAL_COMMUNITY): Payer: Self-pay

## 2021-12-17 DIAGNOSIS — F419 Anxiety disorder, unspecified: Secondary | ICD-10-CM | POA: Diagnosis not present

## 2021-12-17 DIAGNOSIS — F331 Major depressive disorder, recurrent, moderate: Secondary | ICD-10-CM | POA: Diagnosis not present

## 2021-12-17 DIAGNOSIS — F1994 Other psychoactive substance use, unspecified with psychoactive substance-induced mood disorder: Secondary | ICD-10-CM | POA: Diagnosis not present

## 2021-12-17 MED ORDER — NALTREXONE HCL 50 MG PO TABS
50.0000 mg | ORAL_TABLET | Freq: Every day | ORAL | 0 refills | Status: DC
  Filled 2021-12-17: qty 30, 30d supply, fill #0

## 2021-12-17 MED ORDER — QUETIAPINE FUMARATE 200 MG PO TABS
200.0000 mg | ORAL_TABLET | Freq: Every day | ORAL | 0 refills | Status: DC
  Filled 2021-12-17: qty 30, 30d supply, fill #0

## 2021-12-23 ENCOUNTER — Encounter: Payer: Self-pay | Admitting: Gastroenterology

## 2021-12-31 ENCOUNTER — Encounter: Payer: 59 | Admitting: Gastroenterology

## 2021-12-31 ENCOUNTER — Encounter (HOSPITAL_BASED_OUTPATIENT_CLINIC_OR_DEPARTMENT_OTHER): Payer: 59 | Admitting: Internal Medicine

## 2022-01-01 ENCOUNTER — Encounter: Payer: 59 | Admitting: Gastroenterology

## 2022-01-04 ENCOUNTER — Encounter (HOSPITAL_COMMUNITY): Payer: Self-pay

## 2022-01-04 ENCOUNTER — Emergency Department (HOSPITAL_COMMUNITY)
Admission: EM | Admit: 2022-01-04 | Discharge: 2022-01-06 | Disposition: A | Discharge status: Other Acute Inpatient Hospital | Payer: 59 | Attending: Emergency Medicine | Admitting: Emergency Medicine

## 2022-01-04 ENCOUNTER — Other Ambulatory Visit: Payer: Self-pay

## 2022-01-04 DIAGNOSIS — F10229 Alcohol dependence with intoxication, unspecified: Secondary | ICD-10-CM | POA: Insufficient documentation

## 2022-01-04 DIAGNOSIS — F1020 Alcohol dependence, uncomplicated: Secondary | ICD-10-CM | POA: Diagnosis not present

## 2022-01-04 DIAGNOSIS — F32A Depression, unspecified: Secondary | ICD-10-CM | POA: Diagnosis not present

## 2022-01-04 DIAGNOSIS — J449 Chronic obstructive pulmonary disease, unspecified: Secondary | ICD-10-CM | POA: Insufficient documentation

## 2022-01-04 DIAGNOSIS — F332 Major depressive disorder, recurrent severe without psychotic features: Secondary | ICD-10-CM | POA: Diagnosis present

## 2022-01-04 DIAGNOSIS — I10 Essential (primary) hypertension: Secondary | ICD-10-CM | POA: Diagnosis not present

## 2022-01-04 DIAGNOSIS — E119 Type 2 diabetes mellitus without complications: Secondary | ICD-10-CM | POA: Insufficient documentation

## 2022-01-04 DIAGNOSIS — F10920 Alcohol use, unspecified with intoxication, uncomplicated: Secondary | ICD-10-CM

## 2022-01-04 DIAGNOSIS — Z20822 Contact with and (suspected) exposure to covid-19: Secondary | ICD-10-CM | POA: Insufficient documentation

## 2022-01-04 DIAGNOSIS — Z79899 Other long term (current) drug therapy: Secondary | ICD-10-CM | POA: Insufficient documentation

## 2022-01-04 DIAGNOSIS — F10129 Alcohol abuse with intoxication, unspecified: Secondary | ICD-10-CM | POA: Diagnosis not present

## 2022-01-04 DIAGNOSIS — Z794 Long term (current) use of insulin: Secondary | ICD-10-CM | POA: Diagnosis not present

## 2022-01-04 DIAGNOSIS — Z046 Encounter for general psychiatric examination, requested by authority: Secondary | ICD-10-CM | POA: Diagnosis present

## 2022-01-04 DIAGNOSIS — Y907 Blood alcohol level of 200-239 mg/100 ml: Secondary | ICD-10-CM | POA: Diagnosis not present

## 2022-01-04 DIAGNOSIS — R9431 Abnormal electrocardiogram [ECG] [EKG]: Secondary | ICD-10-CM | POA: Diagnosis not present

## 2022-01-04 LAB — CBC WITH DIFFERENTIAL/PLATELET
Abs Immature Granulocytes: 0.01 K/uL (ref 0.00–0.07)
Basophils Absolute: 0 K/uL (ref 0.0–0.1)
Basophils Relative: 0 %
Eosinophils Absolute: 0 K/uL (ref 0.0–0.5)
Eosinophils Relative: 0 %
HCT: 44.1 % (ref 39.0–52.0)
Hemoglobin: 15 g/dL (ref 13.0–17.0)
Immature Granulocytes: 0 %
Lymphocytes Relative: 33 %
Lymphs Abs: 2.1 K/uL (ref 0.7–4.0)
MCH: 29.7 pg (ref 26.0–34.0)
MCHC: 34 g/dL (ref 30.0–36.0)
MCV: 87.3 fL (ref 80.0–100.0)
Monocytes Absolute: 0.5 K/uL (ref 0.1–1.0)
Monocytes Relative: 7 %
Neutro Abs: 3.7 K/uL (ref 1.7–7.7)
Neutrophils Relative %: 60 %
Platelets: 274 K/uL (ref 150–400)
RBC: 5.05 MIL/uL (ref 4.22–5.81)
RDW: 14.8 % (ref 11.5–15.5)
WBC: 6.3 K/uL (ref 4.0–10.5)
nRBC: 0 % (ref 0.0–0.2)

## 2022-01-04 LAB — COMPREHENSIVE METABOLIC PANEL WITH GFR
ALT: 30 U/L (ref 0–44)
AST: 33 U/L (ref 15–41)
Albumin: 3.9 g/dL (ref 3.5–5.0)
Alkaline Phosphatase: 91 U/L (ref 38–126)
Anion gap: 11 (ref 5–15)
BUN: 16 mg/dL (ref 6–20)
CO2: 26 mmol/L (ref 22–32)
Calcium: 8.2 mg/dL — ABNORMAL LOW (ref 8.9–10.3)
Chloride: 103 mmol/L (ref 98–111)
Creatinine, Ser: 0.86 mg/dL (ref 0.61–1.24)
GFR, Estimated: >60 mL/min (ref >60)
Glucose, Bld: 146 mg/dL — ABNORMAL HIGH (ref 70–99)
Potassium: 4.2 mmol/L (ref 3.5–5.1)
Sodium: 140 mmol/L (ref 135–145)
Total Bilirubin: 0.4 mg/dL (ref 0.3–1.2)
Total Protein: 7.3 g/dL (ref 6.5–8.1)

## 2022-01-04 NOTE — ED Provider Notes (Signed)
?[place] DEPT ?Provider Note ? ? ?CSN: [id number] ?Arrival date & time: 01/04/22  2042 ? ?  ? ?History ? ?Chief Complaint  ?Patient presents with  ? Alcohol Intoxication  ? ? ?Christopher Fernandez is a 51 y.o. male. ? ?The history is provided by the patient and medical records.  ?Alcohol Intoxication ? ?51 y.o. M with hx of alcohol abuse, GERD, asthma, COPD, DM2, presenting to the ED acutely intoxicated.  Wife apparently was not able to get ahold of him all day and could not so came home and found him altered.  He reports drinking 2 bottles of vodka today.  He did report taking his home medication but denies any illicit drug use.  Patient admits he had thought about "drinking himself to death" due to some life stressors and "feeling lost".  Denies HI/AVH. ? ?Home Medications ?Prior to Admission medications   ?Medication Sig Start Date End Date Taking? Authorizing Provider  ?acetaminophen (TYLENOL) 500 MG tablet Take 2 tablets (1,000 mg total) by mouth every 6 (six) hours. ?Patient taking differently: Take 1,000 mg by mouth every 6 (six) hours as needed for moderate pain. 01/17/21   Gifford Shave, MD  ?albuterol (VENTOLIN HFA) 108 (90 Base) MCG/ACT inhaler INHALE 2 TO 4 PUFFS INTO THE LUNGS EVERY 4 HOURS AS NEEDED FOR WHEEZING OR COUGH. ?Patient taking differently: Inhale 2-4 puffs into the lungs every 4 (four) hours as needed for wheezing or shortness of breath. 11/20/20 11/30/21  Danna Hefty, DO  ?ARIPiprazole (ABILIFY) 5 MG tablet Take 1 tablet (5 mg total) by mouth daily. 08/18/21   Lurline Del, DO  ?atorvastatin (LIPITOR) 40 MG tablet Take 1 tablet (40 mg total) by mouth daily. 12/23/20   Mullis, Kiersten P, DO  ?cetirizine (ZYRTEC) 10 MG tablet Take 1 tablet (10 mg total) by mouth daily. ?Patient taking differently: Take 10 mg by mouth daily as needed for allergies. 10/29/20   Zenia Resides, MD  ?Continuous Blood Gluc Receiver (DEXCOM G6 RECEIVER) DEVI 1 Device by Does not  apply route 4 (four) times daily. 03/15/20   Mullis, Kiersten P, DO  ?Continuous Blood Gluc Sensor (DEXCOM G6 SENSOR) MISC USE AS DIRECTED 4 TIMES DAILY 06/02/21   Lurline Del, DO  ?Continuous Blood Gluc Transmit (DEXCOM G6 TRANSMITTER) MISC 4 (four) times daily. as directed 09/09/20   [provider]  ?Continuous Blood Gluc Transmit (DEXCOM G6 TRANSMITTER) MISC USE AS DIRECTED FOUR TIMES DAILY 05/30/21   Lurline Del, DO  ?cyclobenzaprine (FLEXERIL) 10 MG tablet Take 1 tablet (10 mg total) by mouth 3 (three) times daily. 11/18/21   Lurline Del, DO  ?doxycycline (VIBRA-TABS) 100 MG tablet Take 1 tablet (100 mg total) by mouth 2 (two) times daily. 10/14/21   Truman Hayward, MD  ?DULoxetine (CYMBALTA) 60 MG capsule Take 1 capsule (60 mg total) by mouth daily. 11/18/21   Lurline Del, DO  ?EASY TOUCH PEN NEEDLES 31G X 5 MM MISC Inject 1 application as directed 3 (three) times daily. 10/27/19   Mullis, Kiersten P, DO  ?empagliflozin (JARDIANCE) 25 MG TABS tablet Take 1 tablet (25 mg total) by mouth daily. 12/15/21   Lurline Del, DO  ?fluticasone-salmeterol (ADVAIR DISKUS) 250-50 MCG/ACT AEPB Inhale 1 puff into the lungs in the morning and at bedtime. 12/04/21   Lurline Del, DO  ?gabapentin (NEURONTIN) 300 MG capsule Take 3 capsules (900 mg total) by mouth 3 (three) times daily. 11/07/21   Lurline Del, DO  ?hydrOXYzine (ATARAX) 25  MG tablet Take 1 tablet (25 mg total) by mouth at bedtime. 12/05/21     ?hydrOXYzine (VISTARIL) 25 MG capsule Take 1 capsule (25 mg total) by mouth at bedtime as needed for anxiety. 11/07/21   Derrill Center, NP  ?ibuprofen (ADVIL) 400 MG tablet Take 1 tablet (400 mg total) by mouth every 8 (eight) hours as needed for mild pain. 01/17/21 01/17/22  Gifford Shave, MD  ?insulin glargine-yfgn (SEMGLEE, YFGN,) 100 UNIT/ML Pen Inject 10 Units into the skin daily. 07/10/21   Lurline Del, DO  ?insulin lispro (HUMALOG KWIKPEN) 100 UNIT/ML KwikPen Inject 6-10 Units into the skin 3 (three)  times daily. 07/10/21   Lurline Del, DO  ?Lancets (FREESTYLE) lancets Use as instructed 03/29/18   Harriet Butte, DO  ?metFORMIN (GLUCOPHAGE) 1000 MG tablet Take 1 tablet (1,000 mg total) by mouth 2 (two) times daily with a meal. 08/18/21   Lurline Del, DO  ?metoprolol succinate (TOPROL-XL) 100 MG 24 hr tablet Take 1.5 tablets (150 mg total) by mouth at bedtime. Take with or immediately following a meal. 04/24/21   Lurline Del, DO  ?mirtazapine (REMERON) 30 MG tablet Take 1 tablet (30 mg total) by mouth at bedtime. 11/18/21   Lurline Del, DO  ?montelukast (SINGULAIR) 10 MG tablet TAKE 1 TABLET (10 MG TOTAL) BY MOUTH AT BEDTIME. ?Patient taking differently: Take 10 mg by mouth in the morning. 11/05/20 01/20/22  Mullis, Kiersten P, DO  ?naltrexone (DEPADE) 50 MG tablet Take 1 tablet (50 mg total) by mouth daily. 12/17/21     ?omeprazole (PRILOSEC) 40 MG capsule Take 1 capsule (40 mg total) by mouth daily. 10/21/21   Lurline Del, DO  ?PEG-KCl-NaCl-NaSulf-Na Asc-C (PLENVU) 140 g SOLR Take 1 kit by mouth as directed. 10/22/21   Mansouraty, Telford Nab., MD  ?QUEtiapine (SEROQUEL) 100 MG tablet Take 1 tablet (100 mg total) by mouth at bedtime. 12/04/21     ?QUEtiapine (SEROQUEL) 200 MG tablet Take 1 tablet (200 mg total) by mouth at bedtime. 12/17/21     ?QUEtiapine (SEROQUEL) 50 MG tablet Take 1 tablet (50 mg total) by mouth at bedtime. 12/04/21     ?QUEtiapine Fumarate 150 MG TABS Take 1 tablet by mouth at bedtime. 12/03/21     ?rifampin (RIFADIN) 300 MG capsule Take 1 capsule (300 mg total) by mouth 2 (two) times daily. 10/14/21   Truman Hayward, MD  ?sitaGLIPtin (JANUVIA) 100 MG tablet Take 1 tablet (100 mg total) by mouth daily. 12/15/21   Lurline Del, DO  ?   ? ?Allergies    ?Eggs or egg-derived products, Morphine and related, and Cocoa   ? ?Review of Systems   ?Review of Systems  ?Psychiatric/Behavioral:  Positive for suicidal ideas.   ?All other systems reviewed and are negative. ? ?Physical Exam ?Updated Vital  Signs ?BP 115/84   Pulse 93   Temp 98.3 ?F (36.8 ?C) (Oral)   Resp 16   Ht 6' 3" (1.905 m)   Wt 101 kg   SpO2 98%   BMI 27.83 kg/m?  ? ?Physical Exam ?Vitals and nursing note reviewed.  ?Constitutional:   ?   Appearance: He is well-developed.  ?HENT:  ?   Head: Normocephalic and atraumatic.  ?Eyes:  ?   Conjunctiva/sclera: Conjunctivae normal.  ?   Pupils: Pupils are equal, round, and reactive to light.  ?Cardiovascular:  ?   Rate and Rhythm: Normal rate and regular rhythm.  ?   Heart sounds: Normal heart sounds.  ?Pulmonary:  ?  Effort: Pulmonary effort is normal. No respiratory distress.  ?   Breath sounds: Normal breath sounds. No rhonchi.  ?Abdominal:  ?   General: Bowel sounds are normal.  ?   Palpations: Abdomen is soft.  ?   Tenderness: There is no abdominal tenderness. There is no rebound.  ?Musculoskeletal:     ?   General: Normal range of motion.  ?   Cervical back: Normal range of motion.  ?Feet:  ?   Comments: Partial right foot amputation ?Skin: ?   General: Skin is warm and dry.  ?Neurological:  ?   Mental Status: He is alert.  ?   Comments: Intoxicated but alert and conversant  ? ? ?ED Results / Procedures / Treatments   ?Labs ?(all labs ordered are listed, but only abnormal results are displayed) ?Labs Reviewed  ?COMPREHENSIVE METABOLIC PANEL - Abnormal; Notable for the following components:  ?    Result Value  ? Glucose, Bld 146 (*)   ? Calcium 8.2 (*)   ? All other components within normal limits  ?ETHANOL - Abnormal; Notable for the following components:  ? Alcohol, Ethyl (B) 315 (*)   ? All other components within normal limits  ?SALICYLATE LEVEL - Abnormal; Notable for the following components:  ? Salicylate Lvl <3.4 (*)   ? All other components within normal limits  ?ACETAMINOPHEN LEVEL - Abnormal; Notable for the following components:  ? Acetaminophen (Tylenol), Serum <10 (*)   ? All other components within normal limits  ?RESP PANEL BY RT-PCR (FLU A&B, COVID) ARPGX2  ?CBC WITH  DIFFERENTIAL/PLATELET  ?RAPID URINE DRUG SCREEN, HOSP PERFORMED  ? ? ?EKG ?None ? ?Radiology ?No results found. ? ?Procedures ?Procedures  ? ? ?Medications Ordered in ED ?Medications - No data to display ? ?ED Course/ Medical

## 2022-01-04 NOTE — ED Triage Notes (Signed)
Pt to ED by EMS from home with c/o acute ETOH intoxication (at LEAST 2 bottles of vodka). Wife states she was unable to get hold of him all day, when she did come home she found him to be "altered and seletively answering questions. Pt will not answer any questions for EMS. Pt also endorses taking his prescribed meds while intoxicated (probably more than prescribed dose). VSS, NADN. ?

## 2022-01-05 DIAGNOSIS — Z79899 Other long term (current) drug therapy: Secondary | ICD-10-CM | POA: Diagnosis not present

## 2022-01-05 DIAGNOSIS — F332 Major depressive disorder, recurrent severe without psychotic features: Secondary | ICD-10-CM | POA: Diagnosis present

## 2022-01-05 DIAGNOSIS — F1020 Alcohol dependence, uncomplicated: Secondary | ICD-10-CM | POA: Diagnosis not present

## 2022-01-05 DIAGNOSIS — Z20822 Contact with and (suspected) exposure to covid-19: Secondary | ICD-10-CM | POA: Diagnosis not present

## 2022-01-05 DIAGNOSIS — F10229 Alcohol dependence with intoxication, unspecified: Secondary | ICD-10-CM | POA: Diagnosis not present

## 2022-01-05 DIAGNOSIS — J449 Chronic obstructive pulmonary disease, unspecified: Secondary | ICD-10-CM | POA: Diagnosis not present

## 2022-01-05 DIAGNOSIS — Z794 Long term (current) use of insulin: Secondary | ICD-10-CM | POA: Diagnosis not present

## 2022-01-05 DIAGNOSIS — E119 Type 2 diabetes mellitus without complications: Secondary | ICD-10-CM | POA: Diagnosis not present

## 2022-01-05 LAB — CBG MONITORING, ED
Glucose-Capillary: 141 mg/dL — ABNORMAL HIGH (ref 70–99)
Glucose-Capillary: 136 mg/dL — ABNORMAL HIGH (ref 70–99)
Glucose-Capillary: 230 mg/dL — ABNORMAL HIGH (ref 70–99)
Glucose-Capillary: 166 mg/dL — ABNORMAL HIGH (ref 70–99)
Glucose-Capillary: 184 mg/dL — ABNORMAL HIGH (ref 70–99)

## 2022-01-05 LAB — HEMOGLOBIN A1C
Hgb A1c MFr Bld: 8.1 % — ABNORMAL HIGH (ref 4.8–5.6)
Mean Plasma Glucose: 185.77 mg/dL

## 2022-01-05 LAB — RESP PANEL BY RT-PCR (FLU A&B, COVID) ARPGX2
Influenza A by PCR: NEGATIVE
Influenza B by PCR: NEGATIVE
SARS Coronavirus 2 by RT PCR: NEGATIVE

## 2022-01-05 LAB — ACETAMINOPHEN LEVEL: Acetaminophen (Tylenol), Serum: <10 ug/mL — ABNORMAL LOW (ref 10–30)

## 2022-01-05 LAB — ETHANOL: Alcohol, Ethyl (B): 315 mg/dL (ref <10)

## 2022-01-05 LAB — SALICYLATE LEVEL: Salicylate Lvl: <7 mg/dL — ABNORMAL LOW (ref 7.0–30.0)

## 2022-01-05 MED ORDER — THIAMINE HCL 100 MG/ML IJ SOLN: 100.0000 mg | Freq: Every day | INTRAMUSCULAR | Status: DC

## 2022-01-05 MED ORDER — ONDANSETRON HCL 4 MG/2ML IJ SOLN: 4.0000 mg | Freq: Once | INTRAMUSCULAR | Status: DC

## 2022-01-05 MED ORDER — INSULIN ASPART PROT & ASPART (70-30 MIX) 100 UNIT/ML ~~LOC~~ SUSP
3.0000 [IU] | Freq: Once | SUBCUTANEOUS | Status: AC
  Administered 2022-01-05: 3 [IU] via SUBCUTANEOUS
  Filled 2022-01-05: qty 10

## 2022-01-05 MED ORDER — MOMETASONE FURO-FORMOTEROL FUM 200-5 MCG/ACT IN AERO
2.0000 | INHALATION_SPRAY | Freq: Two times a day (BID) | RESPIRATORY_TRACT | Status: DC
  Administered 2022-01-05 – 2022-01-06 (×2): 2 via RESPIRATORY_TRACT
  Filled 2022-01-05: qty 8.8

## 2022-01-05 MED ORDER — INSULIN GLARGINE-YFGN 100 UNIT/ML ~~LOC~~ SOPN
10.0000 [IU] | PEN_INJECTOR | Freq: Every day | SUBCUTANEOUS | Status: DC
  Filled 2022-01-05: qty 3

## 2022-01-05 MED ORDER — METFORMIN HCL 500 MG PO TABS
1000.0000 mg | ORAL_TABLET | Freq: Two times a day (BID) | ORAL | Status: DC
  Administered 2022-01-05 – 2022-01-06 (×3): 1000 mg via ORAL
  Filled 2022-01-05 (×3): qty 2

## 2022-01-05 MED ORDER — GABAPENTIN 300 MG PO CAPS
900.0000 mg | ORAL_CAPSULE | Freq: Three times a day (TID) | ORAL | Status: DC
  Administered 2022-01-05 – 2022-01-06 (×4): 900 mg via ORAL
  Filled 2022-01-05 (×4): qty 3

## 2022-01-05 MED ORDER — LORAZEPAM 1 MG PO TABS: 0.0000 mg | ORAL_TABLET | Freq: Two times a day (BID) | ORAL | Status: DC

## 2022-01-05 MED ORDER — METOPROLOL SUCCINATE ER 50 MG PO TB24
150.0000 mg | ORAL_TABLET | Freq: Every day | ORAL | Status: DC
  Administered 2022-01-05: 150 mg via ORAL
  Filled 2022-01-05: qty 3

## 2022-01-05 MED ORDER — INSULIN ASPART 100 UNIT/ML IJ SOLN
0.0000 [IU] | Freq: Three times a day (TID) | INTRAMUSCULAR | Status: DC
  Administered 2022-01-05: 3 [IU] via SUBCUTANEOUS
  Administered 2022-01-06: 2 [IU] via SUBCUTANEOUS
  Filled 2022-01-05: qty 0.15

## 2022-01-05 MED ORDER — ONDANSETRON 4 MG PO TBDP
4.0000 mg | ORAL_TABLET | Freq: Once | ORAL | Status: AC
  Administered 2022-01-05: 4 mg via ORAL
  Filled 2022-01-05: qty 1

## 2022-01-05 MED ORDER — THIAMINE HCL 100 MG PO TABS
100.0000 mg | ORAL_TABLET | Freq: Every day | ORAL | Status: DC
  Administered 2022-01-05 – 2022-01-06 (×2): 100 mg via ORAL
  Filled 2022-01-05 (×2): qty 1

## 2022-01-05 MED ORDER — RIFAMPIN 300 MG PO CAPS
300.0000 mg | ORAL_CAPSULE | Freq: Two times a day (BID) | ORAL | Status: DC
  Administered 2022-01-05 – 2022-01-06 (×3): 300 mg via ORAL
  Filled 2022-01-05 (×4): qty 1

## 2022-01-05 MED ORDER — LORAZEPAM 2 MG/ML IJ SOLN: 0.0000 mg | Freq: Four times a day (QID) | INTRAMUSCULAR | Status: DC

## 2022-01-05 MED ORDER — LORAZEPAM 2 MG/ML IJ SOLN: 0.0000 mg | Freq: Two times a day (BID) | INTRAMUSCULAR | Status: DC

## 2022-01-05 MED ORDER — MONTELUKAST SODIUM 10 MG PO TABS
10.0000 mg | ORAL_TABLET | Freq: Every morning | ORAL | Status: DC
  Administered 2022-01-05 – 2022-01-06 (×2): 10 mg via ORAL
  Filled 2022-01-05 (×2): qty 1

## 2022-01-05 MED ORDER — ATORVASTATIN CALCIUM 40 MG PO TABS
40.0000 mg | ORAL_TABLET | Freq: Every day | ORAL | Status: DC
  Administered 2022-01-05 – 2022-01-06 (×2): 40 mg via ORAL
  Filled 2022-01-05 (×2): qty 1

## 2022-01-05 MED ORDER — MIRTAZAPINE 30 MG PO TABS
30.0000 mg | ORAL_TABLET | Freq: Every day | ORAL | Status: DC
  Administered 2022-01-05: 30 mg via ORAL
  Filled 2022-01-05: qty 1

## 2022-01-05 MED ORDER — DOXYCYCLINE HYCLATE 100 MG PO TABS
100.0000 mg | ORAL_TABLET | Freq: Two times a day (BID) | ORAL | Status: DC
  Administered 2022-01-05 – 2022-01-06 (×3): 100 mg via ORAL
  Filled 2022-01-05 (×3): qty 1

## 2022-01-05 MED ORDER — INSULIN ASPART 100 UNIT/ML IJ SOLN
0.0000 [IU] | Freq: Every day | INTRAMUSCULAR | Status: DC
  Filled 2022-01-05: qty 0.05

## 2022-01-05 MED ORDER — INSULIN GLARGINE-YFGN 100 UNIT/ML ~~LOC~~ SOLN
10.0000 [IU] | Freq: Every day | SUBCUTANEOUS | Status: DC
  Administered 2022-01-05 – 2022-01-06 (×2): 10 [IU] via SUBCUTANEOUS
  Filled 2022-01-05 (×2): qty 0.1

## 2022-01-05 MED ORDER — IBUPROFEN 200 MG PO TABS: 400.0000 mg | ORAL_TABLET | Freq: Three times a day (TID) | ORAL | Status: DC | PRN

## 2022-01-05 MED ORDER — ALBUTEROL SULFATE HFA 108 (90 BASE) MCG/ACT IN AERS: 2.0000 | INHALATION_SPRAY | RESPIRATORY_TRACT | Status: DC | PRN

## 2022-01-05 MED ORDER — DULOXETINE HCL 30 MG PO CPEP
60.0000 mg | ORAL_CAPSULE | Freq: Every day | ORAL | Status: DC
  Administered 2022-01-05 – 2022-01-06 (×2): 60 mg via ORAL
  Filled 2022-01-05 (×2): qty 2

## 2022-01-05 MED ORDER — QUETIAPINE FUMARATE 100 MG PO TABS
200.0000 mg | ORAL_TABLET | Freq: Every day | ORAL | Status: DC
  Administered 2022-01-05: 200 mg via ORAL
  Filled 2022-01-05: qty 2

## 2022-01-05 MED ORDER — EMPAGLIFLOZIN 25 MG PO TABS
25.0000 mg | ORAL_TABLET | Freq: Every day | ORAL | Status: DC
  Administered 2022-01-05 – 2022-01-06 (×2): 25 mg via ORAL
  Filled 2022-01-05 (×2): qty 1

## 2022-01-05 MED ORDER — LINAGLIPTIN 5 MG PO TABS
5.0000 mg | ORAL_TABLET | Freq: Every day | ORAL | Status: DC
  Administered 2022-01-06: 5 mg via ORAL
  Filled 2022-01-05: qty 1

## 2022-01-05 MED ORDER — LORATADINE 10 MG PO TABS
10.0000 mg | ORAL_TABLET | Freq: Every day | ORAL | Status: DC
  Administered 2022-01-05 – 2022-01-06 (×2): 10 mg via ORAL
  Filled 2022-01-05 (×2): qty 1

## 2022-01-05 MED ORDER — LORAZEPAM 1 MG PO TABS
0.0000 mg | ORAL_TABLET | Freq: Four times a day (QID) | ORAL | Status: DC
  Administered 2022-01-05 – 2022-01-06 (×3): 2 mg via ORAL
  Administered 2022-01-06: 1 mg via ORAL
  Filled 2022-01-05: qty 1
  Filled 2022-01-05 (×3): qty 2

## 2022-01-05 MED ORDER — PANTOPRAZOLE SODIUM 40 MG PO TBEC
40.0000 mg | DELAYED_RELEASE_TABLET | Freq: Every day | ORAL | Status: DC
  Administered 2022-01-05 – 2022-01-06 (×2): 40 mg via ORAL
  Filled 2022-01-05 (×2): qty 1

## 2022-01-05 MED ORDER — ARIPIPRAZOLE 5 MG PO TABS
5.0000 mg | ORAL_TABLET | Freq: Every day | ORAL | Status: DC
  Administered 2022-01-05 – 2022-01-06 (×2): 5 mg via ORAL
  Filled 2022-01-05 (×2): qty 1

## 2022-01-05 NOTE — BH Assessment (Signed)
Clinician reviewed pt's chart in preparation to complete pt's MH Assessment. Clinician made contact with pt's nurse, Sherlyn Lick, requesting information regarding why pt was referred for an [place] Assessment. Pt's nurse shared that pt had told the PA that he drank as much as he did in the hopes that he wouldn't wake up. Pt's nurse shared pt is still intoxicated at this time (BAL was 315 at 2317). TTS to attempt assessment at a later time. ?

## 2022-01-05 NOTE — Progress Notes (Signed)
Pt accepted to [facility] tomorrow 01/06/22 after 9:00am; Bed Assignment 300-2 ? ?TN:[id number] episode of recurrent major depressive disorder, without psychotic features (HCCAlcohol use disorder, severe, dependence ? ?Pt meets inpatient criteria per Lindon Romp, NP. ? ?Attending: Dr. Caswell Corwin ? ?Report can be called to:  [phone] -Adult unit: [phone] ? ?Care Team notified: Manuella Ghazi, RN, Rico Sheehan, [facility], [facility], [facility] Wynonia Hazard, Vonzella Nipple, RN, Lodema Pilot, Lindon Romp, NP, Wylie Hail, RN, and Lynnda Shields, RN ? ?Nadara Mode, LCSWA ?01/05/2022 @ 11:50 PM ? ? ? ?

## 2022-01-05 NOTE — BH Assessment (Signed)
Comprehensive Clinical Assessment (CCA) Note ? ?01/05/2022 ?ERVEN RAMSON ?[id number] ? ?DISPOSITION: Per Charmaine Downs, NP: "Daily contact with patient to assess and evaluate symptoms and progress in treatment, Medication management, and Plan Meets criteria for Alcohol detox treatment and treatment for depression.  Will seek bed placement at [facility] or fax out records to other Facilities that has bed."  ? ?The patient demonstrates the following risk factors for suicide: Chronic risk factors for suicide include: psychiatric disorder of major depressive disorder, substance use disorder, and previous suicide attempts by overdose . Acute risk factors for suicide include: family or marital conflict, unemployment, and social withdrawal/isolation. Protective factors for this patient include: responsibility to others (children, family) and hope for the future. Considering these factors, the overall suicide risk at this point appears to be low. Patient is appropriate for outpatient follow up. ? ?[facility] ED from 01/04/2022 in [place] DEPT Counselor from 10/09/2021 in [facility] Counselor from 10/02/2021 in [place]  ?C-SSRS RISK CATEGORY No Risk Error: Question 6 not populated No Risk  ? ?  ? ?Pt is a 51 year old married male who presents unaccompanied to [facility] ED via EMS with acute alcohol intoxication, BAL=315. Per triage note, Pt's wife states she was unable to get hold of him all day and when she did come home she found him to be "altered and seletively answering questions." Pt reports a long history of alcohol use and depression. He states he drinks 1-2 liters of vodka daily. He denies current suicidal ideation. Pt acknowledges one previous suicide attempt 30 year ago by overdosing on medication. Pt denies any history of intentional self-injurious behaviors. Pt denies current homicidal ideation or history of violence.  Pt denies any history of auditory or visual hallucinations. Pt denies use of substances other than alcohol. ? ?Pt is dressed in hospital scrubs, alert and oriented x4. Pt speaks in a clear tone, at moderate volume and normal pace. Motor behavior appears normal. Eye contact is good. Pt's mood is depressed and affect is congruent with mood. Thought process is coherent and relevant. There is no indication Pt is currently responding to internal stimuli or experiencing delusional thought content. Pt agrees to inpatient dual-diagnosis treatment. ? ?Note from Charmaine Downs, NP on 01/05/2022 at 1537: ?Patient was seen this afternoon by provider and he was restless and pacing in the room.  He sat down and was engaged in the interview.  Patient admitted that for most of his life he has had issues with Alcohol.  He reported " I drank too much yesterday "  He reported that he started drinking alcohol at age 71 and he can only remember one year of sobriety.  He also stated that he has been suffering from Depression and anxiety and that his PCP prescribes his mediations.  Records shows he is engaged with Psychiatrist and therapist.  Patient admitted to drinking until he passes out but denied Alcohol withdrawal Seizures.  He is surprised that his wife have managed to put up with his drinking but stated that he believes that his wife is not going to put up with his drinking anymore.  Patient, on arrival to the ER was intoxicated and did not know where he was .  Alcohol level was 315.  Patient reported that his wife is arranging for him to go for Rehab in [place].  He denied SI/HI/AVH and no mention of paranoia.  Earlier he had made a statement that he wanted  to end it all. ?Wife Dwan Hemmelgarn was called for collateral information.  She reported that her husband is about to drink himself to death if nothing is done.  She reported that her husband is seeing a Teacher, music and therapist.  MS [name] also stated that she is looking for  Rehabilitation facility to help her husband.  Meanwhile she believes that her husband is in a "bad place" with Alcohol and his mood.  Patient meets criteria for inpatient Psychiatry hospitalization for detox treatment and Mental health care.  We have resumed home medications and placed him on CIWA protocol using Ativan.  He will fax out records for bed placement to area hospitals if [facility] does not have beds available. ? ? ? ?Chief Complaint:  ?Chief Complaint  ?Patient presents with  ? Alcohol Intoxication  ? ?Visit Diagnosis: ?  Alcohol use disorder, severe, dependence ([place]) ?Active Problems: ?  Severe episode of recurrent major depressive disorder, without psychotic features ([place]) ? ?CCA Screening, Triage and Referral (STR) ? ?Patient Reported Information ?How did you hear about [place]? Family/Friend ? ?Referral name: No data recorded ?Referral phone number: No data recorded ? ?Whom do you see for routine medical problems? No data recorded ?Practice/Facility Name: No data recorded ?Practice/Facility Phone Number: No data recorded ?Name of Contact: No data recorded ?Contact Number: No data recorded ?Contact Fax Number: No data recorded ?Prescriber Name: No data recorded ?Prescriber Address (if known): No data recorded ? ?What Is the Reason for Your Visit/Call Today? Pt has a long history of alcohol use and presents with acute intoxication. He has a history of depression and Pt's wife has expressed concern for his safety. ? ?How Long Has This Been Causing You Problems? > than 6 months ? ?What Do You Feel Would Help You the Most Today? Alcohol or Drug Use Treatment; Treatment for Depression or other mood problem; Medication(s) ? ? ?Have You Recently Been in Any Inpatient Treatment (Hospital/Detox/Crisis Center/28-Day Program)? No data recorded ?Name/Location of Program/Hospital:No data recorded ?How Long Were You There? No data recorded ?When Were You Discharged? No data recorded ? ?Have You Ever Received Services From  [employer] Before? No data recorded ?Who Do You See at [facility]? No data recorded ? ?Have You Recently Had Any Thoughts About Hurting Yourself? No ? ?Are You Planning to Commit Suicide/Harm Yourself At This time? No ? ? ?Have you Recently Had Thoughts About [place]? No ? ?Explanation: No data recorded ? ?Have You Used Any Alcohol or Drugs in the Past 24 Hours? Yes ? ?How Long Ago Did You Use Drugs or Alcohol? No data recorded ?What Did You Use and How Much? At least 2 liters of vodka ? ? ?Do You Currently Have a Therapist/Psychiatrist? No ? ?Name of Therapist/Psychiatrist: No data recorded ? ?Have You Been Recently Discharged From Any Office Practice or Programs? No ? ?Explanation of Discharge From Practice/Program: No data recorded ? ?  ?CCA Screening Triage Referral Assessment ?Type of Contact: Tele-Assessment ? ?Is this Initial or Reassessment? Initial Assessment ? ?Date Telepsych consult ordered in [facility]:  01/05/22 ? ?Time Telepsych consult ordered in [facility]:  0221 ? ? ?Patient Reported Information Reviewed? No data recorded ?Patient Left Without Being Seen? No data recorded ?Reason for Not Completing Assessment: No data recorded ? ?Collateral Involvement: Pt's wife ? ? ?Does Patient Have a Stage manager Guardian? No data recorded ?Name and Contact of Legal Guardian: No data recorded ?If Minor and Not Living with Parent(s), Who has Custody?  NA ? ?Is CPS involved or ever been involved? Never ? ?Is APS involved or ever been involved? Never ? ? ?Patient Determined To Be At Risk for Harm To Self or Others Based on Review of Patient Reported Information or Presenting Complaint? No ? ?Method: No data recorded ?Availability of Means: No data recorded ?Intent: No data recorded ?Notification Required: No data recorded ?Additional Information for Danger to Others Potential: No data recorded ?Additional Comments for Danger to Others Potential: No data recorded ?Are There Guns or Other Weapons in [place]? No data recorded ?Types of Guns/Weapons: No data recorded ?Are These Weapons Safely Secured?                            No data recorded ?Who Could Verify You Are Able To Have These Secured: No data recor

## 2022-01-05 NOTE — ED Notes (Signed)
Pt changed into purple scrub top, no scrub bottoms available in his size. Pt still wearing his own pants. Pt belongings placed in bag. Belongings include shirt and shoes. Belongings bag labeled and placed in nurses station 16-18 cabinet.  ?

## 2022-01-05 NOTE — ED Notes (Signed)
Returned pt belonging bag. Pt confirmed all belongings are present. Items include shoes and t-shirt. Items are in TCU locker 31.  ?

## 2022-01-05 NOTE — BH Assessment (Signed)
Clinician attempted to complete pt's [place] Assessment at 0407; at the time of the attempted assessment, pt appeared to still be under the influence of EtOH, as evidenced by pt repeating himself, his inability to remember information, and him continuously apologizing. Due to these concerns, clinician requested to complete the assessment at a later time; however, when clinician attempted to end the assessment pt insisted on finishing, stating he needed to be d/c. Clinician competed asking the questions of pt, though pt continued to have difficulties remembering information, repeated himself, and continuously apologized. ? ?This information was reviewed with Quintella Reichert, NP, who determined that pt is not yet sober enough to participate in an [place] Assessment and, thus, cannot provide accurate information. NP determined that pt is to be allotted additional time to sober up, be assessed again, and that the NP will this review the information provided to determine a disposition. ? ?Below is the information gathered during the attempted assessment. ? ?Pt states he was brought to the ED because, "I drink too much." He shares he engages in the use of EtOH daily and drinks and "endless" amount. 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 ? ?Pt denies SI, though he admits experiencing SI several days ago. He acknowledges he has attempted to kill himself in the past, though he cannot remember when. Pt denies he currently has a plan to kill himself. When asked if pt has ever been hospitalized in the past for MH concerns, pt stated, "I think so." ? ?Pt denies HI, AVH, NSSIB, access to guns/weapons, or engagement with the legal system. ? ?Pt states, "I need to get home to see my cat. My wife. I've been struggling with remembering things [for] probably... I can't remember." Pt shared that his wife works at [employer] and expressed concern that she would be embarrassed that he  was currently in the hospital. Clinician informed pt that he is at [facility] and that no one at [facility] should have access to pt's record or know that he is currently in the hospital. Pt expressed confusion and questioned if he was at [facility]; clinician again repeated that pt was at [facility] and not at [facility]. ?

## 2022-01-05 NOTE — ED Notes (Signed)
Patient had one episode of emesis.

## 2022-01-05 NOTE — ED Notes (Signed)
Patient with TTS in progress ?

## 2022-01-05 NOTE — ED Notes (Addendum)
Patient unable to complete TTS (see note from counselor). Patient is unable to ask full questions, continually repeating himself mid-sentence, noting to be confused. Patient requesting to leave multiple times despite verbal redirection and explanation of care. MD and PA made aware. Patient is in the process of being IVC'd due to confusion and SI risk. Patient stated multiple times to staff that he just "wanted this to all be over." MD Wickline at bedside explaining plan of care to patient. Patient verbalized understanding of current treatment plan.  ?

## 2022-01-05 NOTE — Consult Note (Signed)
[facility] Face-to-Face Psychiatry Consult  ? ?Reason for Consult:  Psychiatry evaluation ?Referring Physician:  ER Physician ?Patient Identification: Christopher Fernandez ?MRN:  [id number] ?Principal Diagnosis: Alcohol use disorder, severe, dependence ([place]) ?Diagnosis:  Principal Problem: ?  Alcohol use disorder, severe, dependence ([place]) ?Active Problems: ?  Severe episode of recurrent major depressive disorder, without psychotic features ([place]) ? ? ?Total Time spent with patient: 45 minutes ? ?Subjective:   ?Christopher Fernandez is a 51 y.o. male patient admitted with hx of alcohol abuse, GERD, asthma, COPD, DM2, presenting to the ED acutely intoxicated.  ? ?HPI:  Patient was seen this afternoon by provider and he was restless and pacing in the room.  He sat down and was engaged in the interview.  Patient admitted that for most of his life he has had issues with Alcohol.  He reported " I drank too much yesterday "  He reported that he started drinking alcohol at age 33 and he can only remember one year of sobriety.  He also stated that he has been suffering from Depression and anxiety and that his PCP prescribes his mediations.  Records shows he is engaged with Psychiatrist and therapist.  Patient admitted to drinking until he passes out but denied Alcohol withdrawal Seizures.  He is surprised that his wife have managed to put up with his drinking but stated that he believes that his wife is not going to put up with his drinking anymore.  Patient, on arrival to the ER was intoxicated and did not know where he was .  Alcohol level was 315.  Patient reported that his wife is arranging for him to go for Rehab in [place].  He denied SI/HI/AVH and no mention of paranoia.  Earlier he had made a statement that he wanted to end it all. ?Wife Christopher Fernandez was called for collateral information.  She reported that her husband is about to drink himself to death if nothing is done.  She reported that her husband is seeing a Teacher, music and therapist.   Christopher Fernandez also stated that she is looking for Rehabilitation facility to help her husband.  Meanwhile she believes that her husband is in a "bad place" with Alcohol and his mood.  Patient meets criteria for inpatient Psychiatry hospitalization for detox treatment and Mental health care.  We have resumed home medications and placed him on CIWA protocol using Ativan.  He will fax out records for bed placement to area hospitals if [facility] does not have beds available. ? ?Past Psychiatric History: Depression, Anxiety, Alcohol Dependence, hx suicide attempt by OD 30 years ago ? ?Risk to Self:   ?Risk to Others:   ?Prior Inpatient Therapy:   ?Prior Outpatient Therapy:   ? ?Past Medical History:  ?Past Medical History:  ?Diagnosis Date  ? Alcoholism ([place])   ? Allergy   ? Anemia   ? Anxiety   ? Arthritis   ? knees, hands, lower back  ? Asthma   ? Blood transfusion without reported diagnosis   ? COPD (chronic obstructive pulmonary disease) ([place])   ? COVID-19 in immunocompromised patient ([place]) 02/21/2021  ? Depression   ? Diabetes ([place])   ? type 2  ? GERD (gastroesophageal reflux disease)   ? History of hiatal hernia   ? HTN (hypertension)   ? Mass of right chest wall 02/21/2021  ? MRSA bacteremia   ? MRSA infection 01/11/2020  ? Neuromuscular disorder ([place])   ? tremors  ? Osteomyelitis ([place])   ?  right forefoot  ? Pancreatitis   ? Pneumonia   ? Substance abuse ([place])   ? Tuberculosis   ? treated for exposure  ? Wears glasses   ?  ?Past Surgical History:  ?Procedure Laterality Date  ? Amputation Right   ? 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  ? AMPUTATION Right 10/18/2019  ? Procedure: RIGHT SECOND TOE AMPUTATION;  Surgeon: Newt Minion, MD;  Location: [place];  Service: Orthopedics;  Laterality: Right;  ? AMPUTATION Right 11/08/2019  ? Procedure: RIGHT TRANSMETATARSAL AMPUTATION;  Surgeon: Newt Minion, MD;  Location: [place];  Service: Orthopedics;  Laterality: Right;  ? AMPUTATION Left 12/02/2020  ? Procedure: AMPUTATION 2ND TOE;   Surgeon: Elam Dutch, MD;  Location: [place];  Service: Vascular;  Laterality: Left;  ? AMPUTATION Left 02/24/2021  ? Procedure: PARTIAL LEFT THIRD TOE AMPUTATION;  Surgeon: [name], MD;  Location: [place];  Service: Vascular;  Laterality: Left;  ? APPLICATION OF WOUND VAC Right 01/10/2020  ? Procedure: APPLICATION OF WOUND VAC, right foot;  Surgeon: [name], MD;  Location: [place];  Service: Vascular;  Laterality: Right;  ? BILIARY DILATION  12/06/2020  ? Procedure: BILIARY DILATION;  Surgeon: Rush Landmark Telford Nab., MD;  Location: [place];  Service: Gastroenterology;;  ? BIOPSY  12/06/2020  ? Procedure: BIOPSY;  Surgeon: Irving Copas., MD;  Location: [place];  Service: Gastroenterology;;  ? CHOLECYSTECTOMY  2006  ? with gallbladder and spleen  ? COLONOSCOPY  8-10 years ago   ? in [place] exam per pt  ? ENDOSCOPIC RETROGRADE CHOLANGIOPANCREATOGRAPHY (ERCP) WITH PROPOFOL N/A 12/06/2020  ? Procedure: ENDOSCOPIC RETROGRADE CHOLANGIOPANCREATOGRAPHY (ERCP) WITH PROPOFOL;  Surgeon: Rush Landmark Telford Nab., MD;  Location: [place];  Service: Gastroenterology;  Laterality: N/A;  ? ESOPHAGUS SURGERY    ? HERNIA REPAIR  2008, 2010  ? hiatal hernia and 1 additional  ? NASAL SEPTUM SURGERY    ? PANCREATIC PSEUDOCYST DRAINAGE    ? PANCREATIC STENT PLACEMENT  12/06/2020  ? Procedure: PANCREATIC STENT PLACEMENT;  Surgeon: Rush Landmark Telford Nab., MD;  Location: [place];  Service: Gastroenterology;;  ? PLEURAL EFFUSION DRAINAGE Right 01/10/2021  ? Procedure: DRAINAGE OF PLEURAL EFFUSION;  Surgeon: Gaye Pollack, MD;  Location: [place];  Service: Thoracic;  Laterality: Right;  ? REMOVAL OF STONES  12/06/2020  ? Procedure: REMOVAL OF STONES;  Surgeon: Rush Landmark Telford Nab., MD;  Location: [place];  Service: Gastroenterology;;  ? SPHINCTEROTOMY  12/06/2020  ? Procedure: SPHINCTEROTOMY;  Surgeon: Mansouraty, Telford Nab., MD;  Location: [facility] ENDOSCOPY;  Service:  Gastroenterology;;  ? SPLENECTOMY  2006  ? STUMP REVISION Right 11/24/2019  ? Procedure: REVISION RIGHT TRANSMETATARSAL AMPUTATION;  Surgeon: Newt Minion, MD;  Location: [place];  Service: Orthopedics;  Laterality: Right;  ? TEE WITHOUT CARDIOVERSION N/A 12/04/2020  ? Procedure: TRANSESOPHAGEAL ECHOCARDIOGRAM (TEE);  Surgeon: Acie Fredrickson Wonda Cheng, MD;  Location: [place];  Service: Cardiovascular;  Laterality: N/A;  ? THORACIC EXPOSURE Right 01/03/2021  ? Procedure: THORACIC EXPOSURE with removal of rib;  Surgeon: Gaye Pollack, MD;  Location: [place];  Service: Cardiothoracic;  Laterality: Right;  ? THORACOTOMY Right 01/10/2021  ? Procedure: REDO THORACOTOMY;  Surgeon: Gaye Pollack, MD;  Location: [facility] OR;  Service: Thoracic;  Laterality: Right;  ? [place] VERTEBRECTOMY Right 01/03/2021  ? Procedure: Decompression of  Thoracic nine and Thoracic ten via corpectomy reconstruction with titanium spacer and rib autogrtaft. Lateral plate fixation Thoracic eight - Thoracic eleven;  Surgeon: Kristeen Miss,  MD;  Location: [place];  Service: Neurosurgery;  Laterality: Right;  ? TOTAL KNEE ARTHROPLASTY Right 1982, 1984  ? TRANSMETATARSAL AMPUTATION Left 09/06/2020  ? Procedure: AMPUTATION LEFT GREAT TOE;  Surgeon: Waynetta Sandy, MD;  Location: [place];  Service: Vascular;  Laterality: Left;  ? WOUND DEBRIDEMENT Right 01/10/2020  ? Procedure: incisional DEBRIDEMENT of RIGHT TRANSMETATARSAL WOUND;  Surgeon: [name], MD;  Location: [place];  Service: Vascular;  Laterality: Right;  ? ?Family History:  ?Family History  ?Problem Relation Age of Onset  ? Diabetes Mother   ? Hypertension Mother   ? Hyperlipidemia Mother   ? Kidney disease Mother   ? Thyroid disease Mother   ? Breast cancer Mother   ?     mets  ? Lung cancer Mother   ? Diabetes Father   ? Alcohol abuse Sister   ? Sickle cell trait Sister   ? Diabetes Brother   ? Asthma Brother   ? Lung cancer Maternal Grandmother   ? Kidney disease Maternal  Grandmother   ? Liver cancer Maternal Uncle   ?     x 4-5  ? Kidney disease Maternal Uncle   ? Kidney disease Paternal Uncle   ? Colon cancer Neg Hx   ? Colon polyps Neg Hx   ? Esophageal cancer Neg Hx   ? Rec

## 2022-01-06 ENCOUNTER — Inpatient Hospital Stay (HOSPITAL_COMMUNITY)
Admission: AD | Admit: 2022-01-06 | Discharge: 2022-01-12 | DRG: 885 | Disposition: A | Discharge status: Home / Self Care | Payer: 59 | Source: Intra-hospital | Attending: Psychiatry | Admitting: Psychiatry

## 2022-01-06 ENCOUNTER — Encounter (HOSPITAL_COMMUNITY): Payer: Self-pay | Admitting: Nurse Practitioner

## 2022-01-06 ENCOUNTER — Other Ambulatory Visit: Payer: Self-pay

## 2022-01-06 DIAGNOSIS — Z79899 Other long term (current) drug therapy: Secondary | ICD-10-CM | POA: Diagnosis not present

## 2022-01-06 DIAGNOSIS — Z794 Long term (current) use of insulin: Secondary | ICD-10-CM | POA: Diagnosis not present

## 2022-01-06 DIAGNOSIS — K859 Acute pancreatitis without necrosis or infection, unspecified: Secondary | ICD-10-CM | POA: Diagnosis present

## 2022-01-06 DIAGNOSIS — G629 Polyneuropathy, unspecified: Secondary | ICD-10-CM | POA: Diagnosis present

## 2022-01-06 DIAGNOSIS — F332 Major depressive disorder, recurrent severe without psychotic features: Principal | ICD-10-CM | POA: Diagnosis present

## 2022-01-06 DIAGNOSIS — Z9151 Personal history of suicidal behavior: Secondary | ICD-10-CM

## 2022-01-06 DIAGNOSIS — F17210 Nicotine dependence, cigarettes, uncomplicated: Secondary | ICD-10-CM | POA: Diagnosis present

## 2022-01-06 DIAGNOSIS — Z20822 Contact with and (suspected) exposure to covid-19: Secondary | ICD-10-CM | POA: Diagnosis not present

## 2022-01-06 DIAGNOSIS — E119 Type 2 diabetes mellitus without complications: Secondary | ICD-10-CM | POA: Diagnosis present

## 2022-01-06 DIAGNOSIS — K219 Gastro-esophageal reflux disease without esophagitis: Secondary | ICD-10-CM

## 2022-01-06 DIAGNOSIS — I10 Essential (primary) hypertension: Secondary | ICD-10-CM | POA: Diagnosis present

## 2022-01-06 DIAGNOSIS — G4700 Insomnia, unspecified: Secondary | ICD-10-CM | POA: Diagnosis present

## 2022-01-06 DIAGNOSIS — F10229 Alcohol dependence with intoxication, unspecified: Secondary | ICD-10-CM | POA: Diagnosis present

## 2022-01-06 DIAGNOSIS — F10239 Alcohol dependence with withdrawal, unspecified: Secondary | ICD-10-CM | POA: Diagnosis present

## 2022-01-06 DIAGNOSIS — F411 Generalized anxiety disorder: Secondary | ICD-10-CM | POA: Diagnosis present

## 2022-01-06 DIAGNOSIS — J449 Chronic obstructive pulmonary disease, unspecified: Secondary | ICD-10-CM | POA: Diagnosis not present

## 2022-01-06 DIAGNOSIS — E1142 Type 2 diabetes mellitus with diabetic polyneuropathy: Secondary | ICD-10-CM

## 2022-01-06 DIAGNOSIS — M62838 Other muscle spasm: Secondary | ICD-10-CM | POA: Diagnosis present

## 2022-01-06 DIAGNOSIS — J45909 Unspecified asthma, uncomplicated: Secondary | ICD-10-CM | POA: Diagnosis present

## 2022-01-06 DIAGNOSIS — Z7984 Long term (current) use of oral hypoglycemic drugs: Secondary | ICD-10-CM

## 2022-01-06 DIAGNOSIS — F1121 Opioid dependence, in remission: Secondary | ICD-10-CM | POA: Diagnosis present

## 2022-01-06 DIAGNOSIS — E1165 Type 2 diabetes mellitus with hyperglycemia: Secondary | ICD-10-CM

## 2022-01-06 DIAGNOSIS — J4520 Mild intermittent asthma, uncomplicated: Secondary | ICD-10-CM

## 2022-01-06 DIAGNOSIS — F1020 Alcohol dependence, uncomplicated: Secondary | ICD-10-CM | POA: Diagnosis present

## 2022-01-06 LAB — GLUCOSE, CAPILLARY
Glucose-Capillary: 79 mg/dL (ref 70–99)
Glucose-Capillary: 279 mg/dL — ABNORMAL HIGH (ref 70–99)
Glucose-Capillary: 104 mg/dL — ABNORMAL HIGH (ref 70–99)

## 2022-01-06 LAB — CBG MONITORING, ED: Glucose-Capillary: 133 mg/dL — ABNORMAL HIGH (ref 70–99)

## 2022-01-06 MED ORDER — IBUPROFEN 400 MG PO TABS
400.0000 mg | ORAL_TABLET | Freq: Three times a day (TID) | ORAL | Status: DC | PRN
  Administered 2022-01-06 – 2022-01-12 (×8): 400 mg via ORAL
  Filled 2022-01-06 (×9): qty 1

## 2022-01-06 MED ORDER — TEMAZEPAM 15 MG PO CAPS
30.0000 mg | ORAL_CAPSULE | Freq: Once | ORAL | Status: AC | PRN
  Administered 2022-01-06: 30 mg via ORAL
  Filled 2022-01-06: qty 2

## 2022-01-06 MED ORDER — PANTOPRAZOLE SODIUM 40 MG PO TBEC
40.0000 mg | DELAYED_RELEASE_TABLET | Freq: Every day | ORAL | Status: DC
  Administered 2022-01-07 – 2022-01-12 (×6): 40 mg via ORAL
  Filled 2022-01-06 (×8): qty 1

## 2022-01-06 MED ORDER — MOMETASONE FURO-FORMOTEROL FUM 200-5 MCG/ACT IN AERO
2.0000 | INHALATION_SPRAY | Freq: Two times a day (BID) | RESPIRATORY_TRACT | Status: DC
  Administered 2022-01-07 – 2022-01-12 (×11): 2 via RESPIRATORY_TRACT
  Filled 2022-01-06: qty 8.8

## 2022-01-06 MED ORDER — LORATADINE 10 MG PO TABS
10.0000 mg | ORAL_TABLET | Freq: Every day | ORAL | Status: DC
  Administered 2022-01-07 – 2022-01-12 (×6): 10 mg via ORAL
  Filled 2022-01-06 (×8): qty 1

## 2022-01-06 MED ORDER — THIAMINE HCL 100 MG PO TABS
100.0000 mg | ORAL_TABLET | Freq: Every day | ORAL | Status: DC
  Administered 2022-01-07 – 2022-01-12 (×6): 100 mg via ORAL
  Filled 2022-01-06 (×8): qty 1

## 2022-01-06 MED ORDER — LINAGLIPTIN 5 MG PO TABS
5.0000 mg | ORAL_TABLET | Freq: Every day | ORAL | Status: DC
  Administered 2022-01-07 – 2022-01-12 (×6): 5 mg via ORAL
  Filled 2022-01-06 (×8): qty 1

## 2022-01-06 MED ORDER — MONTELUKAST SODIUM 10 MG PO TABS
10.0000 mg | ORAL_TABLET | ORAL | Status: DC
  Administered 2022-01-07 – 2022-01-12 (×6): 10 mg via ORAL
  Filled 2022-01-06 (×9): qty 1

## 2022-01-06 MED ORDER — RIFAMPIN 300 MG PO CAPS
300.0000 mg | ORAL_CAPSULE | Freq: Two times a day (BID) | ORAL | Status: DC
  Administered 2022-01-06 – 2022-01-12 (×13): 300 mg via ORAL
  Filled 2022-01-06 (×16): qty 1

## 2022-01-06 MED ORDER — ONDANSETRON 4 MG PO TBDP: 4.0000 mg | ORAL_TABLET | Freq: Four times a day (QID) | ORAL | Status: AC | PRN

## 2022-01-06 MED ORDER — CYCLOBENZAPRINE HCL 10 MG PO TABS
10.0000 mg | ORAL_TABLET | Freq: Three times a day (TID) | ORAL | Status: DC | PRN
  Administered 2022-01-06 – 2022-01-12 (×12): 10 mg via ORAL
  Filled 2022-01-06 (×12): qty 1

## 2022-01-06 MED ORDER — HYDROXYZINE HCL 25 MG PO TABS
25.0000 mg | ORAL_TABLET | Freq: Four times a day (QID) | ORAL | Status: AC | PRN
  Administered 2022-01-06 – 2022-01-08 (×4): 25 mg via ORAL
  Filled 2022-01-06 (×4): qty 1

## 2022-01-06 MED ORDER — INSULIN GLARGINE-YFGN 100 UNIT/ML ~~LOC~~ SOLN
10.0000 [IU] | Freq: Every day | SUBCUTANEOUS | Status: DC
  Administered 2022-01-06: 10 [IU] via SUBCUTANEOUS

## 2022-01-06 MED ORDER — DOXYCYCLINE HYCLATE 100 MG PO TABS
100.0000 mg | ORAL_TABLET | Freq: Two times a day (BID) | ORAL | Status: DC
  Administered 2022-01-06 – 2022-01-12 (×13): 100 mg via ORAL
  Filled 2022-01-06 (×19): qty 1

## 2022-01-06 MED ORDER — ADULT MULTIVITAMIN W/MINERALS CH
1.0000 | ORAL_TABLET | Freq: Every day | ORAL | Status: DC
  Administered 2022-01-07 – 2022-01-12 (×6): 1 via ORAL
  Filled 2022-01-06 (×8): qty 1

## 2022-01-06 MED ORDER — DULOXETINE HCL 60 MG PO CPEP
60.0000 mg | ORAL_CAPSULE | Freq: Every day | ORAL | Status: DC
  Administered 2022-01-07: 60 mg via ORAL
  Filled 2022-01-06 (×3): qty 1

## 2022-01-06 MED ORDER — LOPERAMIDE HCL 2 MG PO CAPS: 2.0000 mg | ORAL_CAPSULE | ORAL | Status: AC | PRN

## 2022-01-06 MED ORDER — INSULIN ASPART 100 UNIT/ML IJ SOLN
0.0000 [IU] | Freq: Every day | INTRAMUSCULAR | Status: DC
  Administered 2022-01-10: 2 [IU] via SUBCUTANEOUS

## 2022-01-06 MED ORDER — GABAPENTIN 300 MG PO CAPS
900.0000 mg | ORAL_CAPSULE | Freq: Three times a day (TID) | ORAL | Status: DC
  Administered 2022-01-06 – 2022-01-07 (×3): 900 mg via ORAL
  Filled 2022-01-06 (×9): qty 3

## 2022-01-06 MED ORDER — ATORVASTATIN CALCIUM 40 MG PO TABS
40.0000 mg | ORAL_TABLET | Freq: Every day | ORAL | Status: DC
  Administered 2022-01-07 – 2022-01-12 (×6): 40 mg via ORAL
  Filled 2022-01-06 (×8): qty 1

## 2022-01-06 MED ORDER — METFORMIN HCL 500 MG PO TABS
1000.0000 mg | ORAL_TABLET | Freq: Two times a day (BID) | ORAL | Status: DC
  Administered 2022-01-06 – 2022-01-12 (×12): 1000 mg via ORAL
  Filled 2022-01-06 (×18): qty 2

## 2022-01-06 MED ORDER — LORAZEPAM 1 MG PO TABS: 1.0000 mg | ORAL_TABLET | Freq: Four times a day (QID) | ORAL | Status: AC | PRN

## 2022-01-06 MED ORDER — INSULIN ASPART 100 UNIT/ML IJ SOLN
0.0000 [IU] | Freq: Three times a day (TID) | INTRAMUSCULAR | Status: DC
  Administered 2022-01-06: 8 [IU] via SUBCUTANEOUS
  Administered 2022-01-07: 5 [IU] via SUBCUTANEOUS
  Administered 2022-01-07 – 2022-01-08 (×2): 2 [IU] via SUBCUTANEOUS
  Administered 2022-01-08: 5 [IU] via SUBCUTANEOUS
  Administered 2022-01-09: 2 [IU] via SUBCUTANEOUS
  Administered 2022-01-09: 3 [IU] via SUBCUTANEOUS
  Administered 2022-01-10 (×2): 2 [IU] via SUBCUTANEOUS
  Administered 2022-01-10: 5 [IU] via SUBCUTANEOUS
  Administered 2022-01-11: 2 [IU] via SUBCUTANEOUS
  Administered 2022-01-11: 8 [IU] via SUBCUTANEOUS
  Administered 2022-01-12: 2 [IU] via SUBCUTANEOUS

## 2022-01-06 MED ORDER — EMPAGLIFLOZIN 25 MG PO TABS
25.0000 mg | ORAL_TABLET | Freq: Every day | ORAL | Status: DC
  Administered 2022-01-07 – 2022-01-12 (×6): 25 mg via ORAL
  Filled 2022-01-06 (×8): qty 1

## 2022-01-06 NOTE — ED Notes (Signed)
CBG done at 7:30am. Result-133 ?

## 2022-01-06 NOTE — ED Notes (Signed)
Pt given breakfast tray

## 2022-01-06 NOTE — ED Provider Notes (Signed)
Emergency Medicine Observation Re-evaluation Note ? ?Christopher Fernandez is a 51 y.o. male, seen on rounds today.  Pt initially presented to the ED for complaints of Alcohol Intoxication ?Currently, the patient is awaiting TTS disposition. ? ?Physical Exam  ?BP 117/84 (BP Location: Right Arm)   Pulse 70   Temp 97.8 ?F (36.6 ?C) (Oral)   Resp 18   Ht '6\' 3"'$  (1.905 m)   Wt 101 kg   SpO2 95%   BMI 27.83 kg/m?  ?Physical Exam ?General: Calm and cooperative.  ?Cardiac: Well perfused.  ?Lungs: Even, unlabored respirations.  ?Psych: Calm ? ?ED Course / MDM  ?EKG:EKG Interpretation ? ?Date/Time:  Sunday January 04 2022 23:22:29 EDT ?Ventricular Rate:  86 ?PR Interval:  123 ?QRS Duration: 102 ?QT Interval:  385 ?QTC Calculation: 461 ?R Axis:   81 ?Text Interpretation: Sinus rhythm ST elev, probable normal early repol pattern Confirmed by Ripley Fraise [phone]) on 01/05/2022 1:17:49 AM ? ?I have reviewed the labs performed to date as well as medications administered while in observation.  Recent changes in the last 24 hours include patient accepted to [facility] by Dr. Mamie Levers.  ? ?Plan  ?Current plan is for admit to [facility]. ?Christopher Fernandez is under involuntary commitment. ?  ? ?  ?Margette Fast, MD ?01/06/22 1007 ? ?

## 2022-01-06 NOTE — ED Notes (Signed)
Patient was pleasant and cooperative. He slept after assessing his CIWA and administering ativan accordingly.  ?

## 2022-01-06 NOTE — BH Assessment (Signed)
[place] Assessment Progress Note ?  ?Per Charmaine Downs, NP, this pt requires psychiatric hospitalization.  Danika, RN, [facility] has assigned pt to [facility] Rm 300-2 to the service of Dr Caswell Corwin.  Pt presents under IVC initiated by EDP Ripley Fraise, MD. and IVC documents have been faxed to [facility].  EDP Nanda Quinton, MD and pt's nurse, Eustaquio Maize, have been notified, and Eustaquio Maize agrees to call report to [phone].  Pt is to be transported via Event organiser. ?  ?  ?Jalene Mullet, MA ?Behavioral Health Coordinator ?[phone] ? ?

## 2022-01-06 NOTE — ED Notes (Signed)
Patient DC d off unit to [facility] per provider. Patient alert, cooperative, no s/s of distress. Patient ambulatory off unit. Patient escorted and transported by [employer]. ?

## 2022-01-06 NOTE — Tx Team (Signed)
Initial Treatment Plan ?01/06/2022 ?6:01 PM ?[name] ?[id number] ? ? ? ?PATIENT STRESSORS: ?Substance abuse   ? ? ?PATIENT STRENGTHS: ?Ability for insight  ?Communication skills  ?Supportive family/friends  ? ? ?PATIENT IDENTIFIED PROBLEMS: ?Depression  ?Anxiety  ?Polysubstance use  ?  ?  ?  ?  ?  ?  ?  ? ?DISCHARGE CRITERIA:  ?Improved stabilization in mood, thinking, and/or behavior ?Safe-care adequate arrangements made ?Verbal commitment to aftercare and medication compliance ?Withdrawal symptoms are absent or subacute and managed without 24-hour nursing intervention ? ?PRELIMINARY DISCHARGE PLAN: ?Attend 12-step recovery group ?Outpatient therapy ?Return to previous living arrangement ?Return to previous work or school arrangements ? ?PATIENT/FAMILY INVOLVEMENT: ?This treatment plan has been presented to and reviewed with the patient, Christopher Fernandez.  The patient has been given the opportunity to ask questions and make suggestions. ? ?Luna Glasgow, RN ?01/06/2022, 6:01 PM ?

## 2022-01-06 NOTE — Group Note (Signed)
Recreation Therapy Group Note ? ? ?Group Topic:Animal Assisted Therapy   ?Group Date: 01/06/2022 ?Start Time: 1430 ?End Time: 8875 ?Facilitators: Victorino Sparrow, LRT,CTRS ?Location: [place] ? ? ?Animal-Assisted Activity (AAA) Program Checklist/Progress Note ?Patient Eligibility Criteria Checklist & Daily Group note for Rec Tx Intervention ? ?AAA/T Program Assumption of Risk Form signed by Patient/ or Parent Legal Guardian YES ? ?Patient is free of allergies or severe asthma  YES ? ?Patient reports no fear of animals YES ? ?Patient reports no history of cruelty to animals YES ? ?Patient understands their participation is voluntary YES ? ?Patient washes hands before animal contact YES ? ?Patient washes hands after animal contact YES ? ? ?Group Description: Patients provided opportunity to interact with trained and credentialed [employer] Therapy dog and the community volunteer/dog handler. Patients practiced appropriate animal interaction and were educated on dog safety outside of the hospital in common community settings. Patients were allowed to use dog toys and other items to practice commands, engage the dog in play, and/or complete routine aspects of animal care.  ? ?Education: Contractor, Pensions consultant, Communication & Social Skills  ? ? ?Affect/Mood: Appropriate ?  ?Participation Level: Engaged ?  ? ?Clinical Observations/Individualized Feedback: Pt attended and participated in group session.   ? ? ?Plan: Continue to engage patient in RT group sessions 2-3x/week. ? ? ?Victorino Sparrow, LRT,CTRS ?01/06/2022 3:53 PM ?

## 2022-01-06 NOTE — Progress Notes (Signed)
Pt admitted today, voluntarily to [facility] due to depression, anxiety, and ETOH use disorder.  Pt was also having hallucinations per [facility] report, but pt denies SI/HI/AVH on admission at [facility].  Pt was unable to identify any triggers for his relapse on ETOH Pt's ETOH was 315 upon admission at [facility] 01/04/22.  Pt is not having signs or symptoms of alcohol withdrawal on admission at [facility]. 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  Pt has a Dexcom G6 CGM system - device located on right lower abdomen.  Device is not set up because one of the component's of the device is at home.  Pt signed admission paperwork and verbalized understanding of plan of care. ?

## 2022-01-07 ENCOUNTER — Encounter (HOSPITAL_COMMUNITY): Payer: Self-pay

## 2022-01-07 DIAGNOSIS — F332 Major depressive disorder, recurrent severe without psychotic features: Principal | ICD-10-CM

## 2022-01-07 DIAGNOSIS — F411 Generalized anxiety disorder: Secondary | ICD-10-CM

## 2022-01-07 DIAGNOSIS — F1121 Opioid dependence, in remission: Secondary | ICD-10-CM

## 2022-01-07 LAB — LIPID PANEL
Cholesterol: 139 mg/dL (ref 0–200)
HDL: 83 mg/dL (ref >40)
LDL Cholesterol: 38 mg/dL (ref 0–99)
Total CHOL/HDL Ratio: 1.7 RATIO
Triglycerides: 91 mg/dL (ref <150)
VLDL: 18 mg/dL (ref 0–40)

## 2022-01-07 LAB — RAPID URINE DRUG SCREEN, HOSP PERFORMED
Amphetamines: NOT DETECTED
Barbiturates: NOT DETECTED
Benzodiazepines: POSITIVE — AB
Cocaine: NOT DETECTED
Opiates: NOT DETECTED
Tetrahydrocannabinol: NOT DETECTED

## 2022-01-07 LAB — TSH: TSH: 3.55 u[IU]/mL (ref 0.350–4.500)

## 2022-01-07 LAB — GLUCOSE, CAPILLARY
Glucose-Capillary: 85 mg/dL (ref 70–99)
Glucose-Capillary: 128 mg/dL — ABNORMAL HIGH (ref 70–99)
Glucose-Capillary: 238 mg/dL — ABNORMAL HIGH (ref 70–99)
Glucose-Capillary: 147 mg/dL — ABNORMAL HIGH (ref 70–99)

## 2022-01-07 MED ORDER — DULOXETINE HCL 30 MG PO CPEP
30.0000 mg | ORAL_CAPSULE | Freq: Every day | ORAL | Status: AC
  Administered 2022-01-09: 30 mg via ORAL
  Filled 2022-01-07: qty 1

## 2022-01-07 MED ORDER — QUETIAPINE FUMARATE 200 MG PO TABS
200.0000 mg | ORAL_TABLET | Freq: Every day | ORAL | Status: DC
  Administered 2022-01-07 – 2022-01-11 (×5): 200 mg via ORAL
  Filled 2022-01-07 (×9): qty 1

## 2022-01-07 MED ORDER — GABAPENTIN 300 MG PO CAPS
600.0000 mg | ORAL_CAPSULE | Freq: Three times a day (TID) | ORAL | Status: DC
  Administered 2022-01-07 – 2022-01-10 (×9): 600 mg via ORAL
  Filled 2022-01-07 (×16): qty 2

## 2022-01-07 MED ORDER — DULOXETINE HCL 20 MG PO CPEP
20.0000 mg | ORAL_CAPSULE | Freq: Every day | ORAL | Status: AC
  Administered 2022-01-10: 20 mg via ORAL
  Filled 2022-01-07: qty 1

## 2022-01-07 MED ORDER — WHITE PETROLATUM EX OINT
TOPICAL_OINTMENT | CUTANEOUS | Status: AC
  Filled 2022-01-07: qty 5

## 2022-01-07 MED ORDER — VENLAFAXINE HCL ER 37.5 MG PO CP24
37.5000 mg | ORAL_CAPSULE | Freq: Every day | ORAL | Status: AC
  Administered 2022-01-08 – 2022-01-09 (×2): 37.5 mg via ORAL
  Filled 2022-01-07 (×2): qty 1

## 2022-01-07 MED ORDER — INSULIN GLARGINE-YFGN 100 UNIT/ML ~~LOC~~ SOLN
10.0000 [IU] | Freq: Every day | SUBCUTANEOUS | Status: DC
  Administered 2022-01-07 – 2022-01-11 (×5): 10 [IU] via SUBCUTANEOUS

## 2022-01-07 MED ORDER — GABAPENTIN 300 MG PO CAPS
300.0000 mg | ORAL_CAPSULE | Freq: Three times a day (TID) | ORAL | Status: DC
  Filled 2022-01-07 (×2): qty 1

## 2022-01-07 MED ORDER — DULOXETINE HCL 20 MG PO CPEP
40.0000 mg | ORAL_CAPSULE | Freq: Every day | ORAL | Status: AC
  Administered 2022-01-08: 40 mg via ORAL
  Filled 2022-01-07: qty 2

## 2022-01-07 MED ORDER — QUETIAPINE FUMARATE 50 MG PO TABS
50.0000 mg | ORAL_TABLET | Freq: Every evening | ORAL | Status: DC | PRN
  Administered 2022-01-08 – 2022-01-11 (×4): 50 mg via ORAL
  Filled 2022-01-07 (×4): qty 1

## 2022-01-07 MED ORDER — VENLAFAXINE HCL ER 75 MG PO CP24
75.0000 mg | ORAL_CAPSULE | Freq: Every day | ORAL | Status: DC
  Administered 2022-01-10 – 2022-01-12 (×3): 75 mg via ORAL
  Filled 2022-01-07 (×5): qty 1

## 2022-01-07 NOTE — BH IP Treatment Plan (Signed)
Interdisciplinary Treatment and Diagnostic Plan Update ? ?01/07/2022 ?Time of Session: 9:00am  ?[name] ?MRN: [id number] ? ?Principal Diagnosis: MDD (major depressive disorder), recurrent severe, without psychosis ([place]) ? ?Secondary Diagnoses: Principal Problem: ?  MDD (major depressive disorder), recurrent severe, without psychosis ([place]) ?Active Problems: ?  Alcohol use disorder, severe, dependence ([place]) ?  Opioid use disorder, severe, in sustained remission ([place]) ?  GAD (generalized anxiety disorder) ? ? ?Current Medications:  ?Current Facility-Administered Medications  ?Medication Dose Route Frequency Provider Last Rate Last Admin  ? atorvastatin (LIPITOR) tablet 40 mg  40 mg Oral Daily Lindon Romp A, NP   40 mg at 01/07/22 0744  ? cyclobenzaprine (FLEXERIL) tablet 10 mg  10 mg Oral TID PRN Rozetta Nunnery, NP   10 mg at 01/07/22 1113  ? doxycycline (VIBRA-TABS) tablet 100 mg  100 mg Oral BID Lindon Romp A, NP   100 mg at 01/07/22 0743  ? [START ON 01/08/2022] DULoxetine (CYMBALTA) DR capsule 40 mg  40 mg Oral Daily Massengill, Nathan, MD      ? Followed by  ? Derrill Memo ON 01/09/2022] DULoxetine (CYMBALTA) DR capsule 30 mg  30 mg Oral Daily Massengill, Nathan, MD      ? Followed by  ? Derrill Memo ON 01/10/2022] DULoxetine (CYMBALTA) DR capsule 20 mg  20 mg Oral Daily Massengill, Nathan, MD      ? empagliflozin (JARDIANCE) tablet 25 mg  25 mg Oral Daily Lindon Romp A, NP   25 mg at 01/07/22 0741  ? gabapentin (NEURONTIN) capsule 600 mg  600 mg Oral TID Massengill, Ovid Curd, MD      ? hydrOXYzine (ATARAX) tablet 25 mg  25 mg Oral Q6H PRN Lindon Romp A, NP   25 mg at 01/07/22 1405  ? ibuprofen (ADVIL) tablet 400 mg  400 mg Oral Q8H PRN Lindon Romp A, NP   400 mg at 01/07/22 1405  ? insulin aspart (novoLOG) injection 0-15 Units  0-15 Units Subcutaneous TID WC Rozetta Nunnery, NP   2 Units at 01/07/22 1209  ? insulin aspart (novoLOG) injection 0-5 Units  0-5 Units Subcutaneous QHS Lindon Romp A, NP      ? insulin  glargine-yfgn (SEMGLEE) injection 10 Units  10 Units Subcutaneous QHS Massengill, Nathan, MD      ? linagliptin (TRADJENTA) tablet 5 mg  5 mg Oral Daily Lindon Romp A, NP   5 mg at 01/07/22 1950  ? loperamide (IMODIUM) capsule 2-4 mg  2-4 mg Oral PRN Rozetta Nunnery, NP      ? loratadine (CLARITIN) tablet 10 mg  10 mg Oral Daily Lindon Romp A, NP   10 mg at 01/07/22 0744  ? LORazepam (ATIVAN) tablet 1 mg  1 mg Oral Q6H PRN Lindon Romp A, NP      ? metFORMIN (GLUCOPHAGE) tablet 1,000 mg  1,000 mg Oral BID WC Lindon Romp A, NP   1,000 mg at 01/07/22 0743  ? mometasone-formoterol (DULERA) 200-5 MCG/ACT inhaler 2 puff  2 puff Inhalation BID Lindon Romp A, NP   2 puff at 01/07/22 0739  ? montelukast (SINGULAIR) tablet 10 mg  10 mg Oral Irish Elders A, NP   10 mg at 01/07/22 0744  ? multivitamin with minerals tablet 1 tablet  1 tablet Oral Daily Lindon Romp A, NP   1 tablet at 01/07/22 0741  ? ondansetron (ZOFRAN-ODT) disintegrating tablet 4 mg  4 mg Oral Q6H PRN Rozetta Nunnery, NP      ?  pantoprazole (PROTONIX) EC tablet 40 mg  40 mg Oral Daily Lindon Romp A, NP   40 mg at 01/07/22 0743  ? QUEtiapine (SEROQUEL) tablet 200 mg  200 mg Oral QHS Massengill, Nathan, MD      ? QUEtiapine (SEROQUEL) tablet 50 mg  50 mg Oral QHS PRN Massengill, Nathan, MD      ? rifampin (RIFADIN) capsule 300 mg  300 mg Oral BID Lindon Romp A, NP   300 mg at 01/07/22 9024  ? thiamine tablet 100 mg  100 mg Oral Daily Lindon Romp A, NP   100 mg at 01/07/22 0973  ? [START ON 01/08/2022] venlafaxine XR (EFFEXOR-XR) 24 hr capsule 37.5 mg  37.5 mg Oral Q breakfast Massengill, Nathan, MD      ? Followed by  ? Derrill Memo ON 01/10/2022] venlafaxine XR (EFFEXOR-XR) 24 hr capsule 75 mg  75 mg Oral Q breakfast Massengill, Ovid Curd, MD      ? ?PTA Medications: ?Facility-Administered Medications Prior to Admission  ?Medication Dose Route Frequency Provider Last Rate Last Admin  ? 0.9 %  sodium chloride infusion  500 mL Intravenous Once Mansouraty,  Telford Nab., MD      ? 0.9 %  sodium chloride infusion  500 mL Intravenous Once Mansouraty, Telford Nab., MD      ? ?Medications Prior to Admission  ?Medication Sig Dispense Refill Last Dose  ? acetaminophen (TYLENOL) 500 MG tablet Take 2 tablets (1,000 mg total) by mouth every 6 (six) hours. (Patient not taking: Reported on 01/05/2022) 30 tablet 0   ? albuterol (VENTOLIN HFA) 108 (90 Base) MCG/ACT inhaler INHALE 2 TO 4 PUFFS INTO THE LUNGS EVERY 4 HOURS AS NEEDED FOR WHEEZING OR COUGH. (Patient taking differently: Inhale 2-4 puffs into the lungs every 4 (four) hours as needed for wheezing or shortness of breath.) 18 g 11   ? ARIPiprazole (ABILIFY) 5 MG tablet Take 1 tablet (5 mg total) by mouth daily. 90 tablet 2   ? atorvastatin (LIPITOR) 40 MG tablet Take 1 tablet (40 mg total) by mouth daily. 90 tablet 3   ? cetirizine (ZYRTEC) 10 MG tablet Take 1 tablet (10 mg total) by mouth daily. (Patient taking differently: Take 10 mg by mouth daily as needed for allergies or rhinitis.) 30 tablet 11   ? Continuous Blood Gluc Receiver (DEXCOM G6 RECEIVER) DEVI 1 Device by Does not apply route 4 (four) times daily. 1 each 11   ? Continuous Blood Gluc Sensor (DEXCOM G6 SENSOR) MISC USE AS DIRECTED 4 TIMES DAILY 3 each 11   ? Continuous Blood Gluc Transmit (DEXCOM G6 TRANSMITTER) MISC 4 (four) times daily. as directed     ? Continuous Blood Gluc Transmit (DEXCOM G6 TRANSMITTER) MISC USE AS DIRECTED FOUR TIMES DAILY 1 each 11   ? cyclobenzaprine (FLEXERIL) 10 MG tablet Take 1 tablet (10 mg total) by mouth 3 (three) times daily. (Patient taking differently: Take 10 mg by mouth 3 (three) times daily as needed for muscle spasms.) 90 tablet 2   ? doxycycline (VIBRA-TABS) 100 MG tablet Take 1 tablet (100 mg total) by mouth 2 (two) times daily. 60 tablet 11   ? DULoxetine (CYMBALTA) 60 MG capsule Take 1 capsule (60 mg total) by mouth daily. 90 capsule 2   ? EASY TOUCH PEN NEEDLES 31G X 5 MM MISC Inject 1 application as directed 3  (three) times daily. 100 each 11   ? empagliflozin (JARDIANCE) 25 MG TABS tablet Take 1 tablet (25 mg total) by  mouth daily. 90 tablet 3   ? fluticasone-salmeterol (ADVAIR DISKUS) 250-50 MCG/ACT AEPB Inhale 1 puff into the lungs in the morning and at bedtime. 60 each 2   ? gabapentin (NEURONTIN) 300 MG capsule Take 3 capsules (900 mg total) by mouth 3 (three) times daily. 270 capsule 2   ? hydrOXYzine (ATARAX) 25 MG tablet Take 1 tablet (25 mg total) by mouth at bedtime. 30 tablet 0   ? hydrOXYzine (VISTARIL) 25 MG capsule Take 1 capsule (25 mg total) by mouth at bedtime as needed for anxiety. (Patient not taking: Reported on 01/05/2022) 30 capsule 1   ? ibuprofen (ADVIL) 400 MG tablet Take 1 tablet (400 mg total) by mouth every 8 (eight) hours as needed for mild pain. 60 tablet 0   ? insulin glargine-yfgn (SEMGLEE, YFGN,) 100 UNIT/ML Pen Inject 10 Units into the skin daily. (Patient taking differently: Inject 10 Units into the skin at bedtime.) 15 mL 1   ? insulin lispro (HUMALOG KWIKPEN) 100 UNIT/ML KwikPen Inject 6-10 Units into the skin 3 (three) times daily. (Patient taking differently: Inject 6-10 Units into the skin 3 (three) times daily before meals.) 15 mL 11   ? Lancets (FREESTYLE) lancets Use as instructed 100 each 12   ? metFORMIN (GLUCOPHAGE) 1000 MG tablet Take 1 tablet (1,000 mg total) by mouth 2 (two) times daily with a meal. 180 tablet 3   ? metoprolol succinate (TOPROL-XL) 100 MG 24 hr tablet Take 1.5 tablets (150 mg total) by mouth at bedtime. Take with or immediately following a meal. (Patient not taking: Reported on 01/05/2022) 120 tablet 3   ? mirtazapine (REMERON) 30 MG tablet Take 1 tablet (30 mg total) by mouth at bedtime. 90 tablet 3   ? montelukast (SINGULAIR) 10 MG tablet TAKE 1 TABLET (10 MG TOTAL) BY MOUTH AT BEDTIME. (Patient taking differently: Take 10 mg by mouth in the morning.) 90 tablet 11   ? naltrexone (DEPADE) 50 MG tablet Take 1 tablet (50 mg total) by mouth daily. 30 tablet 0    ? omeprazole (PRILOSEC) 40 MG capsule Take 1 capsule (40 mg total) by mouth daily. (Patient taking differently: Take 40 mg by mouth daily before breakfast.) 30 capsule 3   ? QUEtiapine (SEROQUEL) 100 MG tablet Take 1

## 2022-01-07 NOTE — BHH Group Notes (Signed)
Adult Psychoeducational Group Note ? ?Date:  01/07/2022 ?Time:  2:57 PM ? ?Group Topic/Focus:  ?Wellness Toolbox:   The focus of this group is to discuss various aspects of wellness, balancing those aspects and exploring ways to increase the ability to experience wellness.  Patients will create a wellness toolbox for use upon discharge. ? ?Participation Level:  Active ? ?Participation Quality:  Attentive ? ?Affect:  Appropriate ? ?Cognitive:  Alert ? ?Insight: Appropriate ? ?Engagement in Group:  Engaged ? ?Modes of Intervention:  Activity ? ?Additional Comments:  Patient attended and participated in the relaxation group activity. ? ?Annie Sable ?01/07/2022, 2:57 PM ?

## 2022-01-07 NOTE — Group Note (Signed)
Recreation Therapy Group Note ? ? ?Group Topic:Stress Management  ?Group Date: 01/07/2022 ?Start Time: 70 ?End Time: 0981 ?Facilitators: Victorino Sparrow, LRT,CTRS ?Location: [facility] ? ? ?Goal Area(s) Addresses:  ?Patient will identify positive stress management techniques. ?Patient will identify benefits of using stress management post d/c. ? ?Group Description:  Meditation.  LRT played a meditation that focused on setting intention for the day.  It also focused on making the best use for your time and not focusing on things from the previous day but to focus on what you can in the now.  Patients were to listen and follow along as meditation played to fully engage.  Patients also learned they could find other stress management techniques from Youtube, apps and the Internet in general. ? ? ?Affect/Mood: N/A ?  ?Participation Level: Did not attend ?  ? ?Clinical Observations/Individualized Feedback:   ? ? ?Plan: Continue to engage patient in RT group sessions 2-3x/week. ? ? ?Victorino Sparrow, LRT,CTRS ?01/07/2022 11:59 AM ?

## 2022-01-07 NOTE — Progress Notes (Signed)
Will discuss all meds, semglee, seroquel. ? ?Has not heard voices in years. ?

## 2022-01-07 NOTE — H&P (Addendum)
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 Gait & Station: normal Patient leans: N/A            Psychiatric Specialty Exam:  Presentation  General Appearance: Casual  Eye Contact:Good  Speech:Normal Rate  Speech Volume:Normal  Handedness:Right   Mood and Affect  Mood:Depressed; Anxious  Affect:Congruent; Constricted   Thought Process  Thought Processes:Linear  Duration of Psychotic Symptoms: No data recorded Past Diagnosis of Schizophrenia or Psychoactive disorder: No  Descriptions of Associations:Intact  Orientation:Full (Time, Place and Person)  Thought Content:Logical  Hallucinations:Hallucinations: None  Ideas of Reference:None  Suicidal Thoughts:Suicidal Thoughts: No  Homicidal Thoughts:Homicidal Thoughts: No   Sensorium  Memory:Immediate Good; Recent Good; Remote Good  Judgment:Poor  Insight:Poor   Executive Functions  Concentration:Fair  Attention Span:Fair  Recall:Fair  Fund of  Knowledge:Good  Language:Good   Psychomotor Activity  Psychomotor Activity:Psychomotor Activity: Normal   Assets  Assets:Communication Skills; Desire for Improvement; Housing; Intimacy   Sleep  Sleep:Sleep: Poor    Physical Exam: Physical Exam Vitals reviewed.  Constitutional:      General: He is not in acute distress.    Appearance: He is not toxic-appearing.  Pulmonary:     Effort: Pulmonary effort is normal.  Neurological:     Mental Status: He is alert.     Motor: No weakness.     Gait: Gait normal.   Review of Systems  Constitutional:  Negative for chills and fever.  Cardiovascular:  Negative for chest pain and palpitations.  Neurological:  Negative for dizziness, tingling, tremors and headaches.  Psychiatric/Behavioral:  Positive for depression and substance abuse. The patient is nervous/anxious and has insomnia.   Blood pressure (!) 125/94, pulse (!) 111, temperature 97.7 F (36.5 C), resp. rate 17, height 6\' 3"  (1.905 m), weight 102.1 kg, SpO2 100 %. Body mass index is 28.12 kg/m.  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 IVC rescinded.   -- Daily contact with patient to assess and evaluate symptoms and progress in treatment  -- Patient's case to be discussed in multi-disciplinary team meeting  -- Observation Level : q15 minute checks  -- Vital signs:  q12 hours  -- Precautions: suicide, elopement, and assault  2. Psychiatric Diagnoses and Treatment:    -Cross taper from Cymbalta to Effexor due to ineffectiveness of Cymbalta. Decrease Cymbalta from 60 mg once daily, to 40 mg, to  30 mg, to 20 mg, at 1 day intervals, then stop. -Start Effexor 37.5 mg once daily on 4-27, for 2 doses, then increase to 75 mg once daily thereafter -Continue mirtazapine 30 mg nightly -Restart Seroquel 200 mg nightly for MDD and insomnia.  Patient reports this is helpful for insomnia.  We also discussed starting Zyprexa for insomnia and mood stabilization, but in the setting patient's diabetes and elevated A1c, that is increased recently, we will not start Zyprexa at this time. -Start Seroquel 50 mg nightly as needed for insomnia -Stop Restoril.  We will avoid benzodiazepines due to patient's severe alcohol use. -Decrease gabapentin from 900 mg 3 times daily to 600 mg 3 times daily -Continue hydroxyzine 25 mg 3 times daily as needed  -Continue CIWA with Ativan PRN   --  The risks/benefits/side-effects/alternatives to this medication were discussed in detail with the patient and time was given for questions. The patient consents to medication trial.    -- Metabolic profile and EKG monitoring obtained while on an atypical antipsychotic (BMI: Lipid Panel: HbgA1c: QTc:)   -- Encouraged patient to participate in unit milieu and in scheduled group therapies   -- Short Term Goals: Ability to identify changes in lifestyle to reduce recurrence of condition will improve, Ability to verbalize feelings will improve, Ability to disclose and discuss suicidal ideas, Ability to demonstrate self-control will improve, Ability to identify  and develop effective coping behaviors will improve, Ability to maintain clinical measurements within normal limits will improve, Compliance with prescribed medications will improve, and Ability to identify triggers associated with substance abuse/mental health issues will improve  -- Long Term Goals: Improvement in symptoms so as ready for discharge    3. Medical Issues Being Addressed:  -Restart medications as ordered for chronic medical disease including hyperlipidemia,  pancreatic insufficiency, and diabetes mellitus   4. Discharge Planning:   -- Social work and case management to assist with discharge planning and identification of hospital follow-up needs prior to discharge  -- Estimated LOS: 5-7 days  -- Discharge Concerns: Need to establish a safety plan; Medication compliance and effectiveness  -- Discharge Goals: Return home with outpatient referrals for mental health follow-up including medication management/psychotherapy  Observation Level/Precautions:  15 minute checks  Laboratory: See above  Psychotherapy: Group and supportive  Medications:    Consultations:    Discharge Concerns:    Estimated LOS: 5 to 7 days  Other:     Physician Treatment Plan for Primary Diagnosis: MDD (major depressive disorder), recurrent severe, without psychosis (HCC)  Physician Treatment Plan for Secondary Diagnosis: Principal Problem:   MDD (major depressive disorder), recurrent severe, without psychosis (HCC) Active Problems:   Alcohol use disorder, severe, dependence (HCC)   Opioid use disorder, severe, in sustained remission (HCC)   GAD (generalized anxiety disorder)   I certify that inpatient services furnished can reasonably be expected to improve the patient's condition.    Cristy Hilts, MD 4/26/20233:05 PM  Total Time Spent in Direct Patient Care:  I personally spent 60 minutes on the unit in direct patient care. The direct patient care time included face-to-face time with the patient, reviewing the patient's chart, communicating with other professionals, and coordinating care. Greater than 50% of this time was spent in counseling or coordinating care with the patient regarding goals of hospitalization, psycho-education, and discharge planning needs.   Phineas Inches, MD Psychiatrist

## 2022-01-07 NOTE — Progress Notes (Addendum)
D:  Patient's self inventory sheet, patient  has poor sleep, sleep medication not helpful.  Good appetite, normal energy level, good concentration.  Rated depression 4, hopeless 5, anxiety 8.  Withdrawals, diarrhea, chilling, agitation, runny nose, irritability.  Denied SI.   Pain in back, shoulder, worst pain #6.  Goal is discharge.  Plans to work on me.  Highly frustrated.  No discharge plans.  Plans to make phone calls. ?A:  Medications administered per MD orders.  Emotional support and encouragement given patient. ?R:  Safety maintained with 15 minute checks. ? ?

## 2022-01-07 NOTE — Progress Notes (Signed)
Pt refused SEMGLEE injection 10 units. Pt states he takes the med in the evening not in the mornings. Request to adjust times was sent to pharmacy.  ? ? ? ?
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
## 2022-01-07 NOTE — Plan of Care (Signed)
Nurse discussed coping skills with patient.  

## 2022-01-07 NOTE — Group Note (Signed)
[facility] LCSW Group Therapy Note ? ?Date/Time: 01/07/2022 @ 1pm ? ?Type of Therapy and Topic:  Group Therapy:  Strengths and Qualities ? ?Participation Level:  Active ? ?Description of Group:   ? In this group patients will be asked to explore and define the terms strength ans qualities.  Patients will be guided to discuss their thoughts, feelings, and behaviors as to where strengths and qualities originate. Participants will then list some of their strengths and qualities related to each subject topic. This group will be process-oriented, with patients participating in exploration of their own experiences as well as giving and receiving support and challenge from other group members. ? ?Therapeutic Goals: ?Patient will identify specific strengths related to their personal life. ?Patient will identify feelings, thoughts, and beliefs about strengths and qualities. ?Patient will identify ways their strengths have been used. [name] ?Patient will identify situations where they have helped others or made someone else happy. . ? ?Summary of Patient Progress ?Patient participated in group on today. Patient was appropriate, attentive, and sharing in her experiences. Patient was able to discuss some of her feelings and behaviors related to the obstacles she has faced. Patient also shared things he feels he is good at and what he loves about her appearance. Patient interacted positively with staff and peers, and was receptive to feedback provided by staff. ? ? ? ? ?Therapeutic Modalities:   ?Cognitive Behavioral Therapy ?Solution Focused Therapy ?Motivational Interviewing ?Brief Therapy ? ? ?Neria Procter, LCSW, LCAS ?Clincal Social Worker  ?[facility] ? ? ?

## 2022-01-07 NOTE — BHH Group Notes (Signed)
Patient attended the NA group. ?

## 2022-01-07 NOTE — BHH Counselor (Signed)
CSW spoke with Christopher Fernandez at [facility] [phone] who states that the Pt has been accepted to the facility in [place]..  Mr. Christopher Fernandez states that they will need updated information faxed to them from the Pt's recent admission to the behavioral health hospital [phone] and will need an estimated discharge date to be able to purchase/book the Pt's flight to the facility in [place].  CSW will fax this information to Mr. Christopher Fernandez and will contact Mr. Christopher Fernandez with a discharge date once it has been discussed with the providers.  ?

## 2022-01-08 LAB — GLUCOSE, CAPILLARY
Glucose-Capillary: 225 mg/dL — ABNORMAL HIGH (ref 70–99)
Glucose-Capillary: 72 mg/dL (ref 70–99)
Glucose-Capillary: 86 mg/dL (ref 70–99)
Glucose-Capillary: 157 mg/dL — ABNORMAL HIGH (ref 70–99)
Glucose-Capillary: 281 mg/dL — ABNORMAL HIGH (ref 70–99)

## 2022-01-08 MED ORDER — METOPROLOL SUCCINATE ER 50 MG PO TB24
50.0000 mg | ORAL_TABLET | Freq: Every day | ORAL | Status: DC
Start: 2022-01-08 — End: 2022-01-12
  Administered 2022-01-08 – 2022-01-12 (×5): 50 mg via ORAL
  Filled 2022-01-08 (×8): qty 1

## 2022-01-08 MED ORDER — NALTREXONE HCL 50 MG PO TABS
50.0000 mg | ORAL_TABLET | Freq: Every day | ORAL | Status: DC
Start: 2022-01-08 — End: 2022-01-12
  Administered 2022-01-08 – 2022-01-12 (×5): 50 mg via ORAL
  Filled 2022-01-08 (×8): qty 1

## 2022-01-08 MED ORDER — MIRTAZAPINE 15 MG PO TABS
15.0000 mg | ORAL_TABLET | Freq: Every day | ORAL | Status: DC
  Administered 2022-01-08 – 2022-01-11 (×4): 15 mg via ORAL
  Filled 2022-01-08 (×7): qty 1

## 2022-01-08 MED ORDER — ACETAMINOPHEN 325 MG PO TABS
650.0000 mg | ORAL_TABLET | Freq: Four times a day (QID) | ORAL | Status: DC | PRN
  Administered 2022-01-08 – 2022-01-12 (×9): 650 mg via ORAL
  Filled 2022-01-08 (×9): qty 2

## 2022-01-08 NOTE — Progress Notes (Signed)
?   01/08/22 0807  ?Psych Admission Type (Psych Patients Only)  ?Admission Status Involuntary  ?Psychosocial Assessment  ?Patient Complaints None  ?Eye Contact Fair  ?Facial Expression Animated  ?Affect Appropriate to circumstance  ?Speech Argumentative  ?Interaction Assertive  ?Motor Activity Slow  ?Appearance/Hygiene Improved  ?Behavior Characteristics Appropriate to situation;Cooperative  ?Mood Depressed  ?Thought Process  ?Coherency WDL  ?Content WDL  ?Delusions None reported or observed  ?Perception WDL  ?Hallucination None reported or observed  ?Judgment Poor  ?Confusion None  ?Danger to Self  ?Current suicidal ideation? Denies  ?Agreement Not to Harm Self Yes  ?Description of Agreement verbal  ?Danger to Others  ?Danger to Others None reported or observed  ? ? ?

## 2022-01-08 NOTE — BHH Group Notes (Signed)
Adult Psychoeducational Group Note ? ?Date:  01/08/2022 ?Time:  3:41 PM ? ?Group Topic/Focus:  ?Goals Group:   The focus of this group is to help patients establish daily goals to achieve during treatment and discuss how the patient can incorporate goal setting into their daily lives to aide in recovery. ? ?Participation Level:  Active ? ?Participation Quality:  Appropriate ? ?Affect:  Appropriate ? ?Cognitive:  Appropriate ? ?Insight: Appropriate ? ?Engagement in Group:  Engaged ? ?Modes of Intervention:  Discussion ? ?Additional Comments:  Patient attended morning orientation and participated.  ? ?Dorsel Flinn W Molinda Bailiff ?5/63/1497, 3:41 PM ?

## 2022-01-08 NOTE — Progress Notes (Signed)
?   01/08/22 2300  ?Psych Admission Type (Psych Patients Only)  ?Admission Status Involuntary  ?Psychosocial Assessment  ?Patient Complaints Anxiety;Insomnia  ?Eye Contact Fair  ?Facial Expression Animated  ?Affect Appropriate to circumstance  ?Speech Logical/coherent  ?Interaction Assertive  ?Motor Activity Slow  ?Appearance/Hygiene Improved  ?Behavior Characteristics Appropriate to situation  ?Mood Depressed  ?Thought Process  ?Coherency WDL  ?Content WDL  ?Delusions None reported or observed  ?Perception WDL  ?Hallucination None reported or observed  ?Judgment Poor  ?Confusion None  ?Danger to Self  ?Current suicidal ideation?  ?(Denies)  ?Agreement Not to Harm Self Yes  ?Description of Agreement verbal  ? ? ?

## 2022-01-08 NOTE — BHH Counselor (Signed)
CSW contacted Rolm Bookbinder at [facility] and was informed that the Pt was approved for treatment at the center in [place].  He states that if the Pt does not have funds pay for a flight to [place] then Mr. Manson Allan will need to send the CSW an e-mail on Monday morning to have the Pt sign a promissory note and then return it by fax.  Mr. Manson Allan states that after this paperwork is complete on Monday the Pt can catch a later flight to [place] and can be transferred bed to bed.  CSW will contact Mr. Manson Allan on 01/12/2022 to help complete this step between the center and the Pt.  CSW will also inform the Pt's wife, Taahir Grisby of this information.  ?

## 2022-01-08 NOTE — Group Note (Signed)
Occupational Therapy Group Note ? ?Group Topic:Communication  ?Group Date: 01/06/2022 ?Start Time: 1415 ?End Time: 7412 ?Facilitators: Brantley Stage, OT  ? ?Group Description: Group encouraged increased engagement and participation through discussion focused on communication styles. Patients were educated on the different styles of communication including passive, aggressive, assertive, and passive-aggressive communication. Group members shared and reflected on which styles they most often find themselves communicating in and brainstormed strategies on how to transition and practice a more assertive approach. Further discussion explored how to use assertiveness skills and strategies to further advocate and ask questions as it relates to their treatment plan and mental health.  ? ?Therapeutic Goal(s): ?Identify practical strategies to improve communication skills  ?Identify how to use assertive communication skills to address individual needs and wants ? ? ?Participation Level: Active and Engaged ?  ?Participation Quality: Independent ?  ?Behavior: Alert, Appropriate, Attentive , and Cooperative ?  ?Speech/Thought Process: Coherent, Directed, Focused, Organized, and Relevant ?  ?Affect/Mood: Appropriate ?  ?Insight: Good ?  ?Judgement: Good ?  ?Individualization: pt was active and engaged in their participation of group discussion/activity. New goal and system building skills identified  ?Modes of Intervention: Discussion and Education  ?Patient Response to Interventions:  Attentive, Interested , Receptive, and Requested additional information/resources  ?  ?Plan: Continue to engage patient in OT groups 2 - 3x/week. ? ?01/08/2022  ?Brantley Stage, OT ? ?Cornell Barman, OT ? ? ? ?

## 2022-01-08 NOTE — Plan of Care (Signed)

## 2022-01-08 NOTE — BHH Suicide Risk Assessment (Signed)
B[facility] INPATIENT:  Family/Significant Other Suicide Prevention Education ? ?Suicide Prevention Education:  ?Education Completed; Christopher Fernandez [phone] (Wife) has been identified by the patient as the family member/significant other with whom the patient will be residing, and identified as the person(s) who will aid the patient in the event of a mental health crisis (suicidal ideations/suicide attempt).  With written consent from the patient, the family member/significant other has been provided the following suicide prevention education, prior to the and/or following the discharge of the patient. ? ?The suicide prevention education provided includes the following: ?Suicide risk factors ?Suicide prevention and interventions ?National Suicide Hotline telephone number ?[facility] assessment telephone number ?[facility] Emergency Assistance 911 ?[place] and/or Residential Mobile Crisis Unit telephone number ? ?Request made of family/significant other to: ?Remove weapons (e.g., guns, rifles, knives), all items previously/currently identified as safety concern.   ?Remove drugs/medications (over-the-counter, prescriptions, illicit drugs), all items previously/currently identified as a safety concern. ? ?The family member/significant other verbalizes understanding of the suicide prevention education information provided.  The family member/significant other agrees to remove the items of safety concern listed above. ? ?CSW spoke with Christopher Fernandez who states that her husband cannot return home until he has gone to the treatment center in [place].  She states that she has gotten the process started to get him approved and will let the CSW complete the final steps with the treatment center to get him there.  She states that she would like her husband to go directly from the hospital to the treatment center because she is concerned that "if he comes home first I wont be able to convince him to after  again".  She states that she will be bringing him a suitcase of clothing over the weekend so he will have his belongings before he leaves. She states that there are no firearms or weapons in the home.  CSW completed SPE with Christopher Fernandez.  ? ?Christopher Fernandez ?01/08/2022, 11:47 AM ?

## 2022-01-08 NOTE — Progress Notes (Addendum)
[facility] MD Progress Note ? ?01/08/2022 11:26 AM ?[name]  ?MRN:  [id number] ? ?Subjective:   ?Patient is a 51 year old male with a past psychiatric history of major depressive disorder, generalized anxiety disorder, and alcohol use disorder severe, who was admitted to the psychiatric hospital for evaluation of worsening depression, concern for suicidal thoughts, and alcohol withdrawal. ?Patient was medically cleared by [facility] prior to admission to the psychiatric hospital. ?  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  ?Yesterday the psychiatry team made the following recommendations: ?-Cross taper from Cymbalta to Effexor due to ineffectiveness of Cymbalta. ?Decrease Cymbalta from 60 mg once daily, to 40 mg, to 30 mg, to 20 mg, at 1 day intervals, then stop. ?-Start Effexor 37.5 mg once daily on 4-27, for 2 doses, then increase to 75 mg once daily thereafter ?-Continue mirtazapine 30 mg nightly ?-Restart Seroquel 200 mg nightly for MDD and insomnia.   ?-Start Seroquel 50 mg nightly as needed for insomnia ?-Stop Restoril.  We will avoid benzodiazepines due to patient's severe alcohol use. ?-Decrease gabapentin from 900 mg 3 times daily to 600 mg 3 times daily ?-Continue hydroxyzine 25 mg 3 times daily as needed ?-Continue CIWA with Ativan PRN  ? ?On exam today, the pt reports his mood is less depressed. Pt reports he is now feeling less irritable due to less unknowns (ie surrounding admission, diagnosis and treatment plan). Pt reports now that he has an idea of what his stay and treatment plan is looking like, he is feeling more grounded. Pt denies SI and HI during interview today.  ?  ?Pt states his sleep was "terrible." He states it took him a long time to fall asleep - 1.5 hours. Pt states he did not get up to get PRN Seroquel because he  thought that would interrupt any progress toward falling asleep he had made. Pt states he woke up at 3am for the day. He also reports he awoke 3-4 times throughout the night to either void, or "just because." Pt states his appetite is good, "going strong" and he is eating well. ?  ?When asked about side effects pt states he feels "more unsteady" and has noticed he is sweating more.  ?  ?Pt reports he is having some back pain that is worrying him due to his history of back surgery. Pt reports the stiffness and pain at the center of his back that wraps around. Pt reports that it is worrisome since this is a similar presentation that he has prior to surgery.  ? ?Principal Problem: MDD (major depressive disorder), recurrent severe, without psychosis ([place]) ?Diagnosis: Principal Problem: ?  MDD (major depressive disorder), recurrent severe, without psychosis ([place]) ?Active Problems: ?  Alcohol use disorder, severe, dependence ([place]) ?  Opioid use disorder, severe, in sustained remission ([place]) ?  GAD (generalized anxiety disorder) ? ?Total Time spent with patient: 20 minutes ? ?Past Psychiatric History:  ?MDD, GAD, insomnia ?Denies past psychiatric hospitalization ?Reports 1 suicide attempt about 30 years ago by overdose ?Patient reports past psychiatric medication trials include: Prozac, Paxil, Celexa, Seroquel, trazodone, Risperdal, Abilify, gabapentin, Remeron, hydroxyzine, Cymbalta ?Patient reports he has not tried: Lexapro, Effexor, Zyprexa, lithium, Depakote, Trileptal, Tegretol ?Prior to admission psychiatric medications: See above ? ?Past Medical History:  ?Past Medical History:  ?Diagnosis Date  ? Alcoholism ([place])   ? Allergy   ?  Anemia   ? Anxiety   ? Arthritis   ? knees, hands, lower back  ? Asthma   ? Blood transfusion without reported diagnosis   ? COPD (chronic obstructive pulmonary disease) ([place])   ? COVID-19 in immunocompromised patient ([place]) 02/21/2021  ? Depression   ? Diabetes ([place])   ? type 2  ? GERD  (gastroesophageal reflux disease)   ? History of hiatal hernia   ? HTN (hypertension)   ? Mass of right chest wall 02/21/2021  ? MRSA bacteremia   ? MRSA infection 01/11/2020  ? Neuromuscular disorder ([place])   ? tremors  ? Osteomyelitis ([place])   ? right forefoot  ? Pancreatitis   ? Pneumonia   ? Substance abuse ([place])   ? Tuberculosis   ? treated for exposure  ? Wears glasses   ?  ?Past Surgical History:  ?Procedure Laterality Date  ? Amputation Right   ? 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  ? AMPUTATION Right 10/18/2019  ? Procedure: RIGHT SECOND TOE AMPUTATION;  Surgeon: Newt Minion, MD;  Location: [place];  Service: Orthopedics;  Laterality: Right;  ? AMPUTATION Right 11/08/2019  ? Procedure: RIGHT TRANSMETATARSAL AMPUTATION;  Surgeon: Newt Minion, MD;  Location: [place];  Service: Orthopedics;  Laterality: Right;  ? AMPUTATION Left 12/02/2020  ? Procedure: AMPUTATION 2ND TOE;  Surgeon: Elam Dutch, MD;  Location: [place];  Service: Vascular;  Laterality: Left;  ? AMPUTATION Left 02/24/2021  ? Procedure: PARTIAL LEFT THIRD TOE AMPUTATION;  Surgeon: [name], MD;  Location: [place];  Service: Vascular;  Laterality: Left;  ? APPLICATION OF WOUND VAC Right 01/10/2020  ? Procedure: APPLICATION OF WOUND VAC, right foot;  Surgeon: [name], MD;  Location: [place];  Service: Vascular;  Laterality: Right;  ? BILIARY DILATION  12/06/2020  ? Procedure: BILIARY DILATION;  Surgeon: Rush Landmark Telford Nab., MD;  Location: [place];  Service: Gastroenterology;;  ? BIOPSY  12/06/2020  ? Procedure: BIOPSY;  Surgeon: Irving Copas., MD;  Location: [place];  Service: Gastroenterology;;  ? CHOLECYSTECTOMY  2006  ? with gallbladder and spleen  ? COLONOSCOPY  8-10 years ago   ? in [place] exam per pt  ? ENDOSCOPIC RETROGRADE CHOLANGIOPANCREATOGRAPHY (ERCP) WITH PROPOFOL N/A 12/06/2020  ? Procedure: ENDOSCOPIC RETROGRADE CHOLANGIOPANCREATOGRAPHY (ERCP) WITH PROPOFOL;  Surgeon: Rush Landmark  Telford Nab., MD;  Location: [place];  Service: Gastroenterology;  Laterality: N/A;  ? ESOPHAGUS SURGERY    ? HERNIA REPAIR  2008, 2010  ? hiatal hernia and 1 additional  ? NASAL SEPTUM SURGERY    ? PANCREATIC PSEUDOCYST DRAINAGE    ? PANCREATIC STENT PLACEMENT  12/06/2020  ? Procedure: PANCREATIC STENT PLACEMENT;  Surgeon: Rush Landmark Telford Nab., MD;  Location: [place];  Service: Gastroenterology;;  ? PLEURAL EFFUSION DRAINAGE Right 01/10/2021  ? Procedure: DRAINAGE OF PLEURAL EFFUSION;  Surgeon: Gaye Pollack, MD;  Location: [place];  Service: Thoracic;  Laterality: Right;  ? REMOVAL OF STONES  12/06/2020  ? Procedure: REMOVAL OF STONES;  Surgeon: Rush Landmark Telford Nab., MD;  Location: [place];  Service: Gastroenterology;;  ? SPHINCTEROTOMY  12/06/2020  ? Procedure: SPHINCTEROTOMY;  Surgeon: Mansouraty, Telford Nab., MD;  Location: [facility] ENDOSCOPY;  Service: Gastroenterology;;  ? SPLENECTOMY  2006  ? STUMP REVISION Right 11/24/2019  ? Procedure: REVISION RIGHT TRANSMETATARSAL AMPUTATION;  Surgeon: Newt Minion, MD;  Location: [place];  Service: Orthopedics;  Laterality: Right;  ? TEE WITHOUT CARDIOVERSION N/A 12/04/2020  ? Procedure: TRANSESOPHAGEAL ECHOCARDIOGRAM (TEE);  Surgeon: Acie Fredrickson Wonda Cheng, MD;  Location: [place];  Service: Cardiovascular;  Laterality: N/A;  ? THORACIC EXPOSURE Right 01/03/2021  ? Procedure: THORACIC EXPOSURE with removal of rib;  Surgeon: Gaye Pollack, MD;  Location: [place];  Service: Cardiothoracic;  Laterality: Right;  ? THORACOTOMY Right 01/10/2021  ? Procedure: REDO THORACOTOMY;  Surgeon: Gaye Pollack, MD;  Location[facility] OR;  Service: Thoracic;  Laterality: Right;  ? [place] VERTEBRECTOMY Right 01/03/2021  ? Procedure: Decompression of  Thoracic nine and Thoracic ten via corpectomy reconstruction with titanium spacer and rib autogrtaft. Lateral plate fixation Thoracic eight - Thoracic eleven;  Surgeon: Kristeen Miss, MD;  Location: [place];  Service:  Neurosurgery;  Laterality: Right;  ? TOTAL KNEE ARTHROPLASTY Right 1982, 1984  ? TRANSMETATARSAL AMPUTATION Left 09/06/2020  ? Procedure: AMPUTATION LEFT GREAT TOE;  Surgeon: Waynetta Sandy, MD;  Location:

## 2022-01-08 NOTE — Progress Notes (Signed)
?   01/07/22 2200  ?Psych Admission Type (Psych Patients Only)  ?Admission Status Involuntary  ?Psychosocial Assessment  ?Patient Complaints None  ?Eye Contact Fair  ?Facial Expression Animated  ?Affect Appropriate to circumstance  ?Speech Logical/coherent  ?Interaction Assertive  ?Motor Activity Slow  ?Appearance/Hygiene Improved  ?Behavior Characteristics Cooperative;Appropriate to situation  ?Mood Depressed  ?Thought Process  ?Coherency WDL  ?Content WDL  ?Delusions None reported or observed  ?Perception WDL  ?Hallucination None reported or observed  ?Judgment Poor  ?Confusion None  ?Danger to Self  ?Current suicidal ideation? Denies  ?Agreement Not to Harm Self Yes  ?Description of Agreement verbal  ?Danger to Others  ?Danger to Others None reported or observed  ? ? ?

## 2022-01-09 LAB — GLUCOSE, CAPILLARY
Glucose-Capillary: 132 mg/dL — ABNORMAL HIGH (ref 70–99)
Glucose-Capillary: 167 mg/dL — ABNORMAL HIGH (ref 70–99)
Glucose-Capillary: 105 mg/dL — ABNORMAL HIGH (ref 70–99)
Glucose-Capillary: 148 mg/dL — ABNORMAL HIGH (ref 70–99)

## 2022-01-09 NOTE — Plan of Care (Signed)
?  Problem: Education: ?Goal: Verbalization of understanding the information provided will improve ?Outcome: Progressing ?  ?Problem: Health Behavior/Discharge Planning: ?Goal: Identification of resources available to assist in meeting health care needs will improve ?Outcome: Progressing ?Goal: Compliance with treatment plan for underlying cause of condition will improve ?Outcome: Progressing ?  ?Problem: Safety: ?Goal: Periods of time without injury will increase ?Outcome: Progressing ?  ?Problem: Coping: ?Goal: Ability to identify and develop effective coping behavior will improve ?Outcome: Progressing ?  ?

## 2022-01-09 NOTE — Progress Notes (Signed)
Patient denies SI, HI, and A/V/H. Patient stated he did not sleep too well last night but is overall still feeling improvement. Patient concerned with his mid back pain and received tylenol and flexeril today po prn which provided temporary relief. Patient stated he is looking forward to going to rehab but stated he hated the feeling of the "unknown." Patient verbalized feeling tired of his alcohol problem and often asks himself "how many hospitalizations will it take for me to learn." Patient stated feeling worried because he is sure his wife is going to leave him but he has good hope in treatment after discharging and feels more motivated to do better this time. Patient med compliant with depressed mood. Patient provided with encouragement and support.  ? ? 01/09/22 0816  ?Psych Admission Type (Psych Patients Only)  ?Admission Status Involuntary  ?Psychosocial Assessment  ?Patient Complaints Insomnia  ?Eye Contact Fair  ?Facial Expression Anxious  ?Affect Appropriate to circumstance;Depressed  ?Speech Logical/coherent  ?Interaction Assertive  ?Motor Activity Slow  ?Appearance/Hygiene Improved  ?Behavior Characteristics Cooperative  ?Mood Depressed  ?Thought Process  ?Coherency WDL  ?Content WDL  ?Delusions None reported or observed  ?Perception WDL  ?Hallucination None reported or observed  ?Judgment Impaired  ?Confusion None  ?Danger to Self  ?Current suicidal ideation? Denies  ?Agreement Not to Harm Self Yes  ?Description of Agreement verbal  ?Danger to Others  ?Danger to Others None reported or observed  ? ? ?

## 2022-01-09 NOTE — Group Note (Signed)
LCSW Group Therapy Note ? ? ?Group Date: 01/09/2022 ?Start Time: 1300 ?End Time: 1400 ? ?Type of Therapy and Topic:  Group Therapy - Healthy vs Unhealthy Coping Skills ? ?Participation Level:  Active  ? ?Description of Group ?The focus of this group was to determine what unhealthy coping techniques typically are used by group members and what healthy coping techniques would be helpful in coping with various problems. Patients were guided in becoming aware of the differences between healthy and unhealthy coping techniques. Patients were asked to identify 2-3 healthy coping skills they would like to learn to use more effectively. ? ?Therapeutic Goals ?Patients learned that coping is what human beings do all day long to deal with various situations in their lives ?Patients defined and discussed healthy vs unhealthy coping techniques ?Patients identified their preferred coping techniques and identified whether these were healthy or unhealthy ?Patients determined 2-3 healthy coping skills they would like to become more familiar with and use more often. ?Patients provided support and ideas to each other ? ? ?Summary of Patient Progress:  Groups did not occur due to staffing challenges.  A packet that included worksheets and information regarding coping skills was provided to the Pt.  The Pt was given time to ask questions and express any concerns with the CSW.   ? ? ?Therapeutic Modalities ?Cognitive Behavioral Therapy ?Motivational Interviewing ? ?Darleen Crocker, LCSWA ?01/09/2022  1:49 PM   ? ?

## 2022-01-09 NOTE — Group Note (Signed)
Date:  01/09/2022 ?Time:  10:39 AM ? ?Group Topic/Focus:  ?Orientation:   The focus of this group is to educate the patient on the purpose and policies of crisis stabilization and provide a format to answer questions about their admission.  The group details unit policies and expectations of patients while admitted. ? ? ? ?Participation Level:  Active ? ?Participation Quality:  Appropriate ? ?Affect:  Appropriate ? ?Cognitive:  Appropriate ? ?Insight: Appropriate ? ?Engagement in Group:  Engaged ? ?Modes of Intervention:  Discussion ? ?Additional Comments:   ? ?Garvin Fila ?01/09/2022, 10:39 AM ? ?

## 2022-01-09 NOTE — Progress Notes (Signed)
[facility] MD Progress Note ? ?01/09/2022 1:28 PM ?[name]  ?MRN:  [id number] ? ?Subjective:   ?Patient is a 51 year old male with a past psychiatric history of major depressive disorder, generalized anxiety disorder, and alcohol use disorder severe, who was admitted to the psychiatric hospital for evaluation of worsening depression, concern for suicidal thoughts, and alcohol withdrawal. ?Patient was medically cleared by [facility] prior to admission to the psychiatric hospital. ?  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  ?Yesterday the psychiatry team made the following recommendations: ?-Cross taper from Cymbalta to Effexor due to ineffectiveness of Cymbalta. ?Decrease Cymbalta from 60 mg once daily, to 40 mg, to 30 mg, to 20 mg, at 1 day intervals, then stop. ?-Start Effexor 37.5 mg once daily on 4-27, for 2 doses, then increase to 75 mg once daily thereafter ?-Continue mirtazapine 30 mg nightly ?-Restart Seroquel 200 mg nightly for MDD and insomnia.   ?-Start Seroquel 50 mg nightly as needed for insomnia ?-Stop Restoril.  We will avoid benzodiazepines due to patient's severe alcohol use. ?-Decrease gabapentin from 900 mg 3 times daily to 600 mg 3 times daily ?-Continue hydroxyzine 25 mg 3 times daily as needed ?-Continue CIWA with Ativan PRN  ? ?On exam today, the pt reports his mood is less depressed. Pt reports he is now feeling less irritable due to less unknowns (ie surrounding admission, diagnosis and treatment plan). Pt reports now that he has an idea of what his stay and treatment plan is looking like, he is feeling more grounded. Pt denies SI and HI during interview today.  ?  ?Pt states his sleep was "terrible." He states it took him a long time to fall asleep - 1.5 hours. Pt states he did not get up to get PRN Seroquel because he  thought that would interrupt any progress toward falling asleep he had made. Pt states he woke up at 3am for the day. He also reports he awoke 3-4 times throughout the night to either void, or "just because." Pt states his appetite is good, "going strong" and he is eating well. ?  ?When asked about side effects pt states he feels "more unsteady" and has noticed he is sweating more.  ?  ?Pt reports he is having some back pain that is worrying him due to his history of back surgery. Pt reports the stiffness and pain at the center of his back that wraps around. Pt reports that it is worrisome since this is a similar presentation that he has prior to surgery.  ? ?Principal Problem: MDD (major depressive disorder), recurrent severe, without psychosis ([place]) ?Diagnosis: Principal Problem: ?  MDD (major depressive disorder), recurrent severe, without psychosis ([place]) ?Active Problems: ?  Alcohol use disorder, severe, dependence ([place]) ?  Opioid use disorder, severe, in sustained remission ([place]) ?  GAD (generalized anxiety disorder) ? ?Total Time spent with patient: 20 minutes ? ?Past Psychiatric History:  ?MDD, GAD, insomnia ?Denies past psychiatric hospitalization ?Reports 1 suicide attempt about 30 years ago by overdose ?Patient reports past psychiatric medication trials include: Prozac, Paxil, Celexa, Seroquel, trazodone, Risperdal, Abilify, gabapentin, Remeron, hydroxyzine, Cymbalta ?Patient reports he has not tried: Lexapro, Effexor, Zyprexa, lithium, Depakote, Trileptal, Tegretol ?Prior to admission psychiatric medications: See above ? ?Past Medical History:  ?Past Medical History:  ?Diagnosis Date  ? Alcoholism ([place])   ? Allergy   ?  Anemia   ? Anxiety   ? Arthritis   ? knees, hands, lower back  ? Asthma   ? Blood transfusion without reported diagnosis   ? COPD (chronic obstructive pulmonary disease) ([place])   ? COVID-19 in immunocompromised patient ([place]) 02/21/2021  ? Depression   ? Diabetes ([place])   ? type 2  ? GERD  (gastroesophageal reflux disease)   ? History of hiatal hernia   ? HTN (hypertension)   ? Mass of right chest wall 02/21/2021  ? MRSA bacteremia   ? MRSA infection 01/11/2020  ? Neuromuscular disorder ([place])   ? tremors  ? Osteomyelitis ([place])   ? right forefoot  ? Pancreatitis   ? Pneumonia   ? Substance abuse ([place])   ? Tuberculosis   ? treated for exposure  ? Wears glasses   ?  ?Past Surgical History:  ?Procedure Laterality Date  ? Amputation Right   ? 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  ? AMPUTATION Right 10/18/2019  ? Procedure: RIGHT SECOND TOE AMPUTATION;  Surgeon: Newt Minion, MD;  Location: [place];  Service: Orthopedics;  Laterality: Right;  ? AMPUTATION Right 11/08/2019  ? Procedure: RIGHT TRANSMETATARSAL AMPUTATION;  Surgeon: Newt Minion, MD;  Location: [place];  Service: Orthopedics;  Laterality: Right;  ? AMPUTATION Left 12/02/2020  ? Procedure: AMPUTATION 2ND TOE;  Surgeon: Elam Dutch, MD;  Location: [place];  Service: Vascular;  Laterality: Left;  ? AMPUTATION Left 02/24/2021  ? Procedure: PARTIAL LEFT THIRD TOE AMPUTATION;  Surgeon: [name], MD;  Location: [place];  Service: Vascular;  Laterality: Left;  ? APPLICATION OF WOUND VAC Right 01/10/2020  ? Procedure: APPLICATION OF WOUND VAC, right foot;  Surgeon: [name], MD;  Location: [place];  Service: Vascular;  Laterality: Right;  ? BILIARY DILATION  12/06/2020  ? Procedure: BILIARY DILATION;  Surgeon: Rush Landmark Telford Nab., MD;  Location: [place];  Service: Gastroenterology;;  ? BIOPSY  12/06/2020  ? Procedure: BIOPSY;  Surgeon: Irving Copas., MD;  Location: [place];  Service: Gastroenterology;;  ? CHOLECYSTECTOMY  2006  ? with gallbladder and spleen  ? COLONOSCOPY  8-10 years ago   ? in [place] exam per pt  ? ENDOSCOPIC RETROGRADE CHOLANGIOPANCREATOGRAPHY (ERCP) WITH PROPOFOL N/A 12/06/2020  ? Procedure: ENDOSCOPIC RETROGRADE CHOLANGIOPANCREATOGRAPHY (ERCP) WITH PROPOFOL;  Surgeon: Rush Landmark  Telford Nab., MD;  Location: [place];  Service: Gastroenterology;  Laterality: N/A;  ? ESOPHAGUS SURGERY    ? HERNIA REPAIR  2008, 2010  ? hiatal hernia and 1 additional  ? NASAL SEPTUM SURGERY    ? PANCREATIC PSEUDOCYST DRAINAGE    ? PANCREATIC STENT PLACEMENT  12/06/2020  ? Procedure: PANCREATIC STENT PLACEMENT;  Surgeon: Rush Landmark Telford Nab., MD;  Location: [place];  Service: Gastroenterology;;  ? PLEURAL EFFUSION DRAINAGE Right 01/10/2021  ? Procedure: DRAINAGE OF PLEURAL EFFUSION;  Surgeon: Gaye Pollack, MD;  Location: [place];  Service: Thoracic;  Laterality: Right;  ? REMOVAL OF STONES  12/06/2020  ? Procedure: REMOVAL OF STONES;  Surgeon: Rush Landmark Telford Nab., MD;  Location: [place];  Service: Gastroenterology;;  ? SPHINCTEROTOMY  12/06/2020  ? Procedure: SPHINCTEROTOMY;  Surgeon: Mansouraty, Telford Nab., MD;  Location: [facility] ENDOSCOPY;  Service: Gastroenterology;;  ? SPLENECTOMY  2006  ? STUMP REVISION Right 11/24/2019  ? Procedure: REVISION RIGHT TRANSMETATARSAL AMPUTATION;  Surgeon: Newt Minion, MD;  Location: [place];  Service: Orthopedics;  Laterality: Right;  ? TEE WITHOUT CARDIOVERSION N/A 12/04/2020  ? Procedure: TRANSESOPHAGEAL ECHOCARDIOGRAM (TEE);  Surgeon: Acie Fredrickson Wonda Cheng, MD;  Location: [place];  Service: Cardiovascular;  Laterality: N/A;  ? THORACIC EXPOSURE Right 01/03/2021  ? Procedure: THORACIC EXPOSURE with removal of rib;  Surgeon: Gaye Pollack, MD;  Location: [place];  Service: Cardiothoracic;  Laterality: Right;  ? THORACOTOMY Right 01/10/2021  ? Procedure: REDO THORACOTOMY;  Surgeon: Gaye Pollack, MD;  Location: [facility] OR;  Service: Thoracic;  Laterality: Right;  ? [place] VERTEBRECTOMY Right 01/03/2021  ? Procedure: Decompression of  Thoracic nine and Thoracic ten via corpectomy reconstruction with titanium spacer and rib autogrtaft. Lateral plate fixation Thoracic eight - Thoracic eleven;  Surgeon: Kristeen Miss, MD;  Location: [place];  Service:  Neurosurgery;  Laterality: Right;  ? TOTAL KNEE ARTHROPLASTY Right 1982, 1984  ? TRANSMETATARSAL AMPUTATION Left 09/06/2020  ? Procedure: AMPUTATION LEFT GREAT TOE;  Surgeon: Waynetta Sandy, MD;  Location:

## 2022-01-09 NOTE — BHH Counselor (Addendum)
CSW spoke with Mrs. Kenric Ginger (Wife) and informed her that the Pt had been accepted to the inpatient facility in [place].  CSW asked about flight details.  Mrs. Gali states that she would like the facility to take care of the flight and send the paperwork for her husband to sign through the CSW by e-mail on Monday morning.  Mrs. Niazi states that she would still like her husband to go straight to the treatment facility from the hospital. She states that she is concerned that he will not go if he leaves and comes home first.  She states that she will be brining his suitcase on Sunday and will continue to discuss with him why it is important that he goes straight to the treatment facility.  CSW will contact the treatment facility on Monday morning to arrange for paperwork to be sent and signed and for a flight to be arranged for Monday evening.  ?

## 2022-01-09 NOTE — Progress Notes (Signed)
Latest Reference Range & Units 01/08/22 12:04 01/08/22 16:22 01/08/22 19:49  ?Glucose-Capillary 70 - 99 mg/dL 86 157 (H) 281 (H)  ?(H): Data is abnormally high ? ?Patient refused sliding scale insulin states he only take Semglee nothing else. Patient educated support and encouragement provided.  ?

## 2022-01-09 NOTE — BHH Group Notes (Signed)
Pt attended and contributed to group 

## 2022-01-09 NOTE — BHH Group Notes (Signed)
Patient did not attend the relaxation group. 

## 2022-01-10 LAB — AMYLASE: Amylase: 71 U/L (ref 28–100)

## 2022-01-10 LAB — GLUCOSE, CAPILLARY
Glucose-Capillary: 130 mg/dL — ABNORMAL HIGH (ref 70–99)
Glucose-Capillary: 126 mg/dL — ABNORMAL HIGH (ref 70–99)
Glucose-Capillary: 224 mg/dL — ABNORMAL HIGH (ref 70–99)
Glucose-Capillary: 238 mg/dL — ABNORMAL HIGH (ref 70–99)

## 2022-01-10 LAB — LIPASE, BLOOD: Lipase: 20 U/L (ref 11–51)

## 2022-01-10 MED ORDER — GABAPENTIN 300 MG PO CAPS
900.0000 mg | ORAL_CAPSULE | Freq: Three times a day (TID) | ORAL | Status: DC
  Administered 2022-01-10 – 2022-01-12 (×7): 900 mg via ORAL
  Filled 2022-01-10 (×13): qty 3

## 2022-01-10 NOTE — Progress Notes (Signed)
Adult Psychoeducational Group Note ? ?Date:  01/10/2022 ?Time:  9:45 PM ? ?Group Topic/Focus:  ?Wrap-Up Group:   The focus of this group is to help patients review their daily goal of treatment and discuss progress on daily workbooks. ? ?Participation Level:  Active ? ?Participation Quality:  Appropriate ? ?Affect:  Appropriate ? ?Cognitive:  Appropriate ? ?Insight: Appropriate ? ?Engagement in Group:  Engaged ? ?Modes of Intervention:  Education and Exploration ? ?Additional Comments:  Patient attended and participated in group tonight. He reports that today he learnt to be reminded of his self image and self worth. ? ?Debe Coder ?01/10/2022, 9:45 PM ?

## 2022-01-10 NOTE — BHH Counselor (Signed)
Clinical Social Work Note ? ?CSW spoke by phone with Gwyndolyn Saxon at [facility]) in [place] to update on patient's status.  This facility is booking a flight for the patient for Monday 5/1 and will be emailing a Promissory Note to CSW to have the patient sign, stating he will pay the facility back for the plane ticket in one year.  Gwyndolyn Saxon will call on Sunday 4/30 to inform CSW of the time of the flight. ? ?Selmer Dominion, LCSW ?01/10/2022, 1:49 PM ?  ?

## 2022-01-10 NOTE — Progress Notes (Signed)
?   01/09/22 0816  ?Psych Admission Type (Psych Patients Only)  ?Admission Status Involuntary  ?Psychosocial Assessment  ?Patient Complaints Insomnia  ?Eye Contact Fair  ?Facial Expression Anxious  ?Affect Appropriate to circumstance;Depressed  ?Speech Logical/coherent  ?Interaction Assertive  ?Motor Activity Slow  ?Appearance/Hygiene Improved  ?Behavior Characteristics Cooperative  ?Mood Depressed  ?Thought Process  ?Coherency WDL  ?Content WDL  ?Delusions None reported or observed  ?Perception WDL  ?Hallucination None reported or observed  ?Judgment Impaired  ?Confusion None  ?Danger to Self  ?Current suicidal ideation? Denies  ?Agreement Not to Harm Self Yes  ?Description of Agreement verbal  ?Danger to Others  ?Danger to Others None reported or observed  ? ? ?

## 2022-01-10 NOTE — BHH Group Notes (Signed)
Goals Group ?4/29//2023 ? ? ?Group Focus: affirmation, clarity of thought, and goals/reality orientation ?Treatment Modality:  Psychoeducation ?Interventions utilized were assignment, group exercise, and support ?Purpose: To be able to understand and verbalize the reason for their admission to the hospital. To understand that the medication helps with their chemical imbalance but they also need to work on their choices in life. To be challenged to develop a list of 30 positives about themselves. Also introduce the concept that "feelings" are not reality. ? ?Participation Level:  did not attend ?Additional Comments:   ? ?[name] A ?

## 2022-01-10 NOTE — Progress Notes (Addendum)
[facility] MD Progress Note ? ?01/10/2022 2:31 PM ?Christopher Fernandez  ?MRN:  [id number] ? ?Subjective:  "I feel good because I am looking at the reality of my situation." ? ?Brief History: Patient is a 51 year old male with a past psychiatric history of major depressive disorder, generalized anxiety disorder, and alcohol use disorder severe, who was admitted to the psychiatric hospital for evaluation of worsening depression, concern for suicidal thoughts, and alcohol withdrawal. ?Patient was medically cleared by [facility] prior to admission to the psychiatric hospital.  ? ?Daily Notes 01/10/22: Patient seen and examined in office sitting on a chair. Chart reviewed and findings shared with the treatment team and discussed with Dr. Nelda Marseille. Alert and oriented to person, time, place and situation. Speech clear with normal volume and pattern. Mood/Affect appropriate, anxious and depressed. Thought process coherent goal directed and linear. Thought content WNL and logical Memory, judgement and insight fair. Actively attending therapeutic milieu and group therapy. Taking prescribed medications without adverse effects. Continues on Detox protocol. No observed withdrawal episode.  ? ?Denied SI/HI/AVH. Endorsed sleeping 4 hours last night, normal for patient. Endorsed good appetite and mood. Concerned about back pain and stated he would discuss with Dr. Caswell Corwin for possible MRI before discharge. Amylase and Lipase levels ordered for f/u on chronic pancreatitis. Gabapentin increased to 900 mg po TID for neuropathy. Informed this Provider that on discharge he would be discharging to [facility] in [place]. Abnormal routine lab with capillary blood glucose 126. Continues on sliding scale insulin. ? ?Principal Problem: MDD (major depressive disorder), recurrent severe, without psychosis ([place]) ? ?Diagnosis: Principal Problem: ?  MDD (major depressive disorder), recurrent severe, without psychosis ([place]) ?Active  Problems: ?  Alcohol use disorder, severe, dependence ([place]) ?  Opioid use disorder, severe, in sustained remission ([place]) ?  GAD (generalized anxiety disorder) ? ?Total Time spent with patient: 45 minutes ? ?Past Psychiatric History: MDD, GAD, insomnia ?Denies past psychiatric hospitalization ?Reports 1 suicide attempt about 30 years ago by overdose ?Patient reports past psychiatric medication trials include: Prozac, Paxil, Celexa, Seroquel, trazodone, Risperdal, Abilify, gabapentin, Remeron, hydroxyzine, Cymbalta ?Patient reports he has not tried: Lexapro, Effexor, Zyprexa, lithium, Depakote, Trileptal, Tegretol ?Prior to admission psychiatric medications: See above ? ?Past Medical History:  ?Past Medical History:  ?Diagnosis Date  ? Alcoholism ([place])   ? Allergy   ? Anemia   ? Anxiety   ? Arthritis   ? knees, hands, lower back  ? Asthma   ? Blood transfusion without reported diagnosis   ? COPD (chronic obstructive pulmonary disease) ([place])   ? COVID-19 in immunocompromised patient ([place]) 02/21/2021  ? Depression   ? Diabetes ([place])   ? type 2  ? GERD (gastroesophageal reflux disease)   ? History of hiatal hernia   ? HTN (hypertension)   ? Mass of right chest wall 02/21/2021  ? MRSA bacteremia   ? MRSA infection 01/11/2020  ? Neuromuscular disorder ([place])   ? tremors  ? Osteomyelitis ([place])   ? right forefoot  ? Pancreatitis   ? Pneumonia   ? Substance abuse ([place])   ? Tuberculosis   ? treated for exposure  ? Wears glasses   ?  ?Past Surgical History:  ?Procedure Laterality Date  ? Amputation Right   ? 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  ? AMPUTATION Right 10/18/2019  ? Procedure: RIGHT SECOND TOE AMPUTATION;  Surgeon: Newt Minion, MD;  Location: [place];  Service: Orthopedics;  Laterality: Right;  ? AMPUTATION Right 11/08/2019  ?  Procedure: RIGHT TRANSMETATARSAL AMPUTATION;  Surgeon: Newt Minion, MD;  Location: [place];  Service: Orthopedics;  Laterality: Right;  ? AMPUTATION Left 12/02/2020  ? Procedure: AMPUTATION 2ND TOE;   Surgeon: Elam Dutch, MD;  Location: [place];  Service: Vascular;  Laterality: Left;  ? AMPUTATION Left 02/24/2021  ? Procedure: PARTIAL LEFT THIRD TOE AMPUTATION;  Surgeon: [name], MD;  Location: [place];  Service: Vascular;  Laterality: Left;  ? APPLICATION OF WOUND VAC Right 01/10/2020  ? Procedure: APPLICATION OF WOUND VAC, right foot;  Surgeon: [name], MD;  Location: [place];  Service: Vascular;  Laterality: Right;  ? BILIARY DILATION  12/06/2020  ? Procedure: BILIARY DILATION;  Surgeon: Rush Landmark Telford Nab., MD;  Location: [place];  Service: Gastroenterology;;  ? BIOPSY  12/06/2020  ? Procedure: BIOPSY;  Surgeon: Irving Copas., MD;  Location: [place];  Service: Gastroenterology;;  ? CHOLECYSTECTOMY  2006  ? with gallbladder and spleen  ? COLONOSCOPY  8-10 years ago   ? in [place] exam per pt  ? ENDOSCOPIC RETROGRADE CHOLANGIOPANCREATOGRAPHY (ERCP) WITH PROPOFOL N/A 12/06/2020  ? Procedure: ENDOSCOPIC RETROGRADE CHOLANGIOPANCREATOGRAPHY (ERCP) WITH PROPOFOL;  Surgeon: Rush Landmark Telford Nab., MD;  Location: [place];  Service: Gastroenterology;  Laterality: N/A;  ? ESOPHAGUS SURGERY    ? HERNIA REPAIR  2008, 2010  ? hiatal hernia and 1 additional  ? NASAL SEPTUM SURGERY    ? PANCREATIC PSEUDOCYST DRAINAGE    ? PANCREATIC STENT PLACEMENT  12/06/2020  ? Procedure: PANCREATIC STENT PLACEMENT;  Surgeon: Rush Landmark Telford Nab., MD;  Location: [place];  Service: Gastroenterology;;  ? PLEURAL EFFUSION DRAINAGE Right 01/10/2021  ? Procedure: DRAINAGE OF PLEURAL EFFUSION;  Surgeon: Gaye Pollack, MD;  Location: [place];  Service: Thoracic;  Laterality: Right;  ? REMOVAL OF STONES  12/06/2020  ? Procedure: REMOVAL OF STONES;  Surgeon: Rush Landmark Telford Nab., MD;  Location: [place];  Service: Gastroenterology;;  ? SPHINCTEROTOMY  12/06/2020  ? Procedure: SPHINCTEROTOMY;  Surgeon: Mansouraty, Telford Nab., MD;  Location: [facility] ENDOSCOPY;  Service:  Gastroenterology;;  ? SPLENECTOMY  2006  ? STUMP REVISION Right 11/24/2019  ? Procedure: REVISION RIGHT TRANSMETATARSAL AMPUTATION;  Surgeon: Newt Minion, MD;  Location: [place];  Service: Orthopedics;  Laterality: Right;  ? TEE WITHOUT CARDIOVERSION N/A 12/04/2020  ? Procedure: TRANSESOPHAGEAL ECHOCARDIOGRAM (TEE);  Surgeon: Acie Fredrickson Wonda Cheng, MD;  Location: [place];  Service: Cardiovascular;  Laterality: N/A;  ? THORACIC EXPOSURE Right 01/03/2021  ? Procedure: THORACIC EXPOSURE with removal of rib;  Surgeon: Gaye Pollack, MD;  Location: [place];  Service: Cardiothoracic;  Laterality: Right;  ? THORACOTOMY Right 01/10/2021  ? Procedure: REDO THORACOTOMY;  Surgeon: Gaye Pollack, MD;  Location: [facility];  Service: Thoracic;  Laterality: Right;  ? [place] VERTEBRECTOMY Right 01/03/2021  ? Procedure: Decompression of  Thoracic nine and Thoracic ten via corpectomy reconstruction with titanium spacer and rib autogrtaft. Lateral plate fixation Thoracic eight - Thoracic eleven;  Surgeon: Kristeen Miss, MD;  Location: [place];  Service: Neurosurgery;  Laterality: Right;  ? TOTAL KNEE ARTHROPLASTY Right 1982, 1984  ? TRANSMETATARSAL AMPUTATION Left 09/06/2020  ? Procedure: AMPUTATION LEFT GREAT TOE;  Surgeon: Waynetta Sandy, MD;  Location: [place];  Service: Vascular;  Laterality: Left;  ? WOUND DEBRIDEMENT Right 01/10/2020  ? Procedure: incisional DEBRIDEMENT of RIGHT TRANSMETATARSAL WOUND;  Surgeon: [name], MD;  Location: [place];  Service: Vascular;  Laterality: Right;  ? ?Family History:  ?Family History  ?  Problem Relation Age of Onset  ? Diabetes Mother   ? Hypertension Mother   ? Hyperlipidemia Mother   ? Kidney disease Mother   ? Thyroid disease Mother   ? Breast cancer Mother   ?     mets  ? Lung cancer Mother   ? Diabetes Father   ? Alcohol abuse Sister   ? Sickle cell trait Sister   ? Diabetes Brother   ? Asthma Brother   ? Lung cancer Maternal Grandmother   ? Kidney disease Maternal  Grandmother   ? Liver cancer Maternal Uncle   ?     x 4-5  ? Kidney disease Maternal Uncle   ? Kidney disease Paternal Uncle   ? Colon cancer Neg Hx   ? Colon polyps Neg Hx   ? Esophageal cancer Neg Hx   ? Rectal

## 2022-01-10 NOTE — BHH Group Notes (Signed)
.  Psychoeducational Group Note ? ? ? ?Date:  4/29//23 ?Time: 1300-1400 ? ? ? ?Purpose of Group: . The group focus' on teaching patients on how to identify their needs and their Life Skills:  A group where two lists are made. What people need and what are things that we do that are unhealthy. The lists are developed by the patients and it is explained that we often do the actions that are not healthy to get our list of needs met. ? ?Goal:: to develop the coping skills needed to get their needs met ? ?Participation Level:  Active ? ?Participation Quality:  Appropriate ? ?Affect:  Appropriate ? ?Cognitive:  Oriented ? ?Insight:  Improving ? ?Engagement in Group:  Engaged ? ?Additional Comments: rates energy at a 3/10. Participated fully in the group. ? ?[name] A ? ?

## 2022-01-10 NOTE — Progress Notes (Signed)
?   01/09/22 2130  ?Psych Admission Type (Psych Patients Only)  ?Admission Status Involuntary  ?Psychosocial Assessment  ?Patient Complaints Insomnia  ?Eye Contact Fair  ?Facial Expression Anxious  ?Affect Appropriate to circumstance  ?Speech Logical/coherent  ?Interaction Assertive  ?Motor Activity Slow  ?Appearance/Hygiene Improved  ?Behavior Characteristics Cooperative  ?Mood Depressed;Pleasant  ?Thought Process  ?Coherency WDL  ?Content WDL  ?Delusions None reported or observed  ?Perception WDL  ?Hallucination None reported or observed  ?Judgment Impaired  ?Confusion None  ?Danger to Self  ?Current suicidal ideation?  ?(denies)  ?Agreement Not to Harm Self Yes  ?Description of Agreement verbally contracts  ?Danger to Others  ?Danger to Others None reported or observed  ? ? ?

## 2022-01-10 NOTE — Group Note (Signed)
LCSW Group Therapy Note ? ?No social work group was held today due to staffing of one Education officer, museum on Adult unit and  high number of admissions that required initial psychosocial assessments.  The following was provided to the patient in lieu of in-person group: ? ?Healthy vs. Unhealthy Supports and Coping Skills ? ? ?Unhealthy                                                              Healthy ?Works (at first) Works   ?Stops working or starts Secondary school teacher working  ?Fast Usually takes time to develop  ?Easy Often difficult to learn  ?Usually a habit Usually unknown, has to become a habit  ?Can do alone Often need to reach out for help   ?Leads to loss Leads to gain  ?   ?   ? ?My Unhealthy Coping Skills                                    My Healthy Coping Skills ?   ?   ?   ?   ?   ?   ?   ? ?My Unhealthy Supports                                           My Healthy Supports ?   ?   ?   ?   ?   ?   ?   ? ? ? ? ? ?Use the stationery provided to write a Goodbye Letter to one of your unhealthy coping skills or unhealthy supports. ? ?Share with other patients as you desire. ? ?Selmer Dominion, LCSW ?01/10/2022  ?3:36 PM  ?  ? ?

## 2022-01-10 NOTE — Progress Notes (Signed)
D. Pt presented as anxious, but friendly upon initial approach. Pt complained of chronic back pain 8/10 this am, reduced to 6/10 with prn ibuprofen. Per pt's self inventory, pt rated his depression, hopelessness and anxiety a 5/2/8, respectively. Pt wrote that his goal was to work on his "gratitude", by "praying and reflecting on blessings."Pt currently denies SI/HI and AVH .  ?A. Labs and vitals monitored. Pt given and educated on medications. Pt supported emotionally and encouraged to express concerns and ask questions.   ?R. Pt remains safe with 15 minute checks. Will continue POC. ? ?  ?

## 2022-01-11 LAB — GLUCOSE, CAPILLARY
Glucose-Capillary: 191 mg/dL — ABNORMAL HIGH (ref 70–99)
Glucose-Capillary: 106 mg/dL — ABNORMAL HIGH (ref 70–99)
Glucose-Capillary: 292 mg/dL — ABNORMAL HIGH (ref 70–99)
Glucose-Capillary: 200 mg/dL — ABNORMAL HIGH (ref 70–99)

## 2022-01-11 MED ORDER — NICOTINE POLACRILEX 2 MG MT GUM
2.0000 mg | CHEWING_GUM | OROMUCOSAL | Status: DC | PRN
  Administered 2022-01-12 (×3): 2 mg via ORAL
  Filled 2022-01-11 (×4): qty 1

## 2022-01-11 NOTE — Progress Notes (Signed)
[facility] MD Progress Note ? ?01/11/2022 3:22 PM ?Christopher Fernandez  ?MRN:  [id number] ? ?Subjective:  "I feel good, however, disappointed by [facility] as they are out of network for my insurance." ? ?Brief History: Patient is a 51 year old male with a past psychiatric history of major depressive disorder, generalized anxiety disorder, and alcohol use disorder severe, who was admitted to the psychiatric hospital for evaluation of worsening depression, concern for suicidal thoughts, and alcohol withdrawal. ?Patient was medically cleared by [facility] prior to admission to the psychiatric hospital.  ? ?Daily Notes 01/11/22: Patient seen face-to-face and examined in office sitting on a chair. Chart reviewed and findings shared with the treatment team and discussed with Dr. Nelda Marseille. Alert and oriented to person, time, place and situation. Speech clear with normal volume and pattern. Mood/Affect appropriate, anxious and depressed. Thought process coherent goal directed and linear. Thought content WNL and logical Memory, judgement and insight fair. Actively attending therapeutic milieu and group therapy. Taking prescribed medications without adverse effects. Continues on Detox protocol. No observed withdrawal episode.  ? ?Denied SI/HI/AVH. Endorsed sleeping 5 hours last night, normal for patient. Endorsed good appetite and mood. Concerned about back pain and stated he would discuss with Dr. Caswell Corwin for possible MRI before discharge. Amylase and Lipase levels ordered for f/u on chronic pancreatitis, both results are normal. Gabapentin increased to 900 mg po TID for neuropathy yesterday. Pleased with decrease nerve pain and was able to sleep better last night. [facility] in [place] called today and informed the SW that patient's insurance is out-of-network and would not be able to accept patient on Monday as scheduled. Abnormal routine lab with capillary blood glucose 106. Continues on sliding scale  insulin. ? ?Principal Problem: MDD (major depressive disorder), recurrent severe, without psychosis ([place]) ? ?Diagnosis: Principal Problem: ?  MDD (major depressive disorder), recurrent severe, without psychosis ([place]) ?Active Problems: ?  Alcohol use disorder, severe, dependence ([place]) ?  Opioid use disorder, severe, in sustained remission ([place]) ?  GAD (generalized anxiety disorder) ? ?Total Time spent with patient: 45 minutes ? ?Past Psychiatric History: MDD, GAD, insomnia ?Denies past psychiatric hospitalization ?Reports 1 suicide attempt about 30 years ago by overdose ?Patient reports past psychiatric medication trials include: Prozac, Paxil, Celexa, Seroquel, trazodone, Risperdal, Abilify, gabapentin, Remeron, hydroxyzine, Cymbalta ?Patient reports he has not tried: Lexapro, Effexor, Zyprexa, lithium, Depakote, Trileptal, Tegretol ?Prior to admission psychiatric medications: See above ? ?Past Medical History:  ?Past Medical History:  ?Diagnosis Date  ? Alcoholism ([place])   ? Allergy   ? Anemia   ? Anxiety   ? Arthritis   ? knees, hands, lower back  ? Asthma   ? Blood transfusion without reported diagnosis   ? COPD (chronic obstructive pulmonary disease) ([place])   ? COVID-19 in immunocompromised patient ([place]) 02/21/2021  ? Depression   ? Diabetes ([place])   ? type 2  ? GERD (gastroesophageal reflux disease)   ? History of hiatal hernia   ? HTN (hypertension)   ? Mass of right chest wall 02/21/2021  ? MRSA bacteremia   ? MRSA infection 01/11/2020  ? Neuromuscular disorder ([place])   ? tremors  ? Osteomyelitis ([place])   ? right forefoot  ? Pancreatitis   ? Pneumonia   ? Substance abuse ([place])   ? Tuberculosis   ? treated for exposure  ? Wears glasses   ?  ?Past Surgical History:  ?Procedure Laterality Date  ? Amputation Right   ? Hallux secondary to  infection  ? AMPUTATION Right 10/18/2019  ? Procedure: RIGHT SECOND TOE AMPUTATION;  Surgeon: Newt Minion, MD;  Location: [place];  Service: Orthopedics;  Laterality: Right;  ?  AMPUTATION Right 11/08/2019  ? Procedure: RIGHT TRANSMETATARSAL AMPUTATION;  Surgeon: Newt Minion, MD;  Location: [place];  Service: Orthopedics;  Laterality: Right;  ? AMPUTATION Left 12/02/2020  ? Procedure: AMPUTATION 2ND TOE;  Surgeon: Elam Dutch, MD;  Location: [place];  Service: Vascular;  Laterality: Left;  ? AMPUTATION Left 02/24/2021  ? Procedure: PARTIAL LEFT THIRD TOE AMPUTATION;  Surgeon: [name], MD;  Location: [place];  Service: Vascular;  Laterality: Left;  ? APPLICATION OF WOUND VAC Right 01/10/2020  ? Procedure: APPLICATION OF WOUND VAC, right foot;  Surgeon: [name], MD;  Location: [name];  Service: Vascular;  Laterality: Right;  ? BILIARY DILATION  12/06/2020  ? Procedure: BILIARY DILATION;  Surgeon: Rush Landmark Telford Nab., MD;  Location: [place];  Service: Gastroenterology;;  ? BIOPSY  12/06/2020  ? Procedure: BIOPSY;  Surgeon: Irving Copas., MD;  Location: [place];  Service: Gastroenterology;;  ? CHOLECYSTECTOMY  2006  ? with gallbladder and spleen  ? COLONOSCOPY  8-10 years ago   ? in [place] exam per pt  ? ENDOSCOPIC RETROGRADE CHOLANGIOPANCREATOGRAPHY (ERCP) WITH PROPOFOL N/A 12/06/2020  ? Procedure: ENDOSCOPIC RETROGRADE CHOLANGIOPANCREATOGRAPHY (ERCP) WITH PROPOFOL;  Surgeon: Rush Landmark Telford Nab., MD;  Location: [place];  Service: Gastroenterology;  Laterality: N/A;  ? ESOPHAGUS SURGERY    ? HERNIA REPAIR  2008, 2010  ? hiatal hernia and 1 additional  ? NASAL SEPTUM SURGERY    ? PANCREATIC PSEUDOCYST DRAINAGE    ? PANCREATIC STENT PLACEMENT  12/06/2020  ? Procedure: PANCREATIC STENT PLACEMENT;  Surgeon: Rush Landmark Telford Nab., MD;  Location: [place];  Service: Gastroenterology;;  ? PLEURAL EFFUSION DRAINAGE Right 01/10/2021  ? Procedure: DRAINAGE OF PLEURAL EFFUSION;  Surgeon: Gaye Pollack, MD;  Location: [place];  Service: Thoracic;  Laterality: Right;  ? REMOVAL OF STONES  12/06/2020  ? Procedure: REMOVAL OF STONES;   Surgeon: Rush Landmark Telford Nab., MD;  Location: [place];  Service: Gastroenterology;;  ? SPHINCTEROTOMY  12/06/2020  ? Procedure: SPHINCTEROTOMY;  Surgeon: Mansouraty, Telford Nab., MD;  Location: [facility] ENDOSCOPY;  Service: Gastroenterology;;  ? SPLENECTOMY  2006  ? STUMP REVISION Right 11/24/2019  ? Procedure: REVISION RIGHT TRANSMETATARSAL AMPUTATION;  Surgeon: Newt Minion, MD;  Location: [place];  Service: Orthopedics;  Laterality: Right;  ? TEE WITHOUT CARDIOVERSION N/A 12/04/2020  ? Procedure: TRANSESOPHAGEAL ECHOCARDIOGRAM (TEE);  Surgeon: Acie Fredrickson Wonda Cheng, MD;  Location: [place];  Service: Cardiovascular;  Laterality: N/A;  ? THORACIC EXPOSURE Right 01/03/2021  ? Procedure: THORACIC EXPOSURE with removal of rib;  Surgeon: Gaye Pollack, MD;  Location: [place];  Service: Cardiothoracic;  Laterality: Right;  ? THORACOTOMY Right 01/10/2021  ? Procedure: REDO THORACOTOMY;  Surgeon: Gaye Pollack, MD;  Location: [facility] OR;  Service: Thoracic;  Laterality: Right;  ? [place] VERTEBRECTOMY Right 01/03/2021  ? Procedure: Decompression of  Thoracic nine and Thoracic ten via corpectomy reconstruction with titanium spacer and rib autogrtaft. Lateral plate fixation Thoracic eight - Thoracic eleven;  Surgeon: Kristeen Miss, MD;  Location: [place];  Service: Neurosurgery;  Laterality: Right;  ? TOTAL KNEE ARTHROPLASTY Right 1982, 1984  ? TRANSMETATARSAL AMPUTATION Left 09/06/2020  ? Procedure: AMPUTATION LEFT GREAT TOE;  Surgeon: Waynetta Sandy, MD;  Location: [place];  Service: Vascular;  Laterality: Left;  ?  WOUND DEBRIDEMENT Right 01/10/2020  ? Procedure: incisional DEBRIDEMENT of RIGHT TRANSMETATARSAL WOUND;  Surgeon: [name], MD;  Location: [place];  Service: Vascular;  Laterality: Right;  ? ?Family History:  ?Family History  ?Problem Relation Age of Onset  ? Diabetes Mother   ? Hypertension Mother   ? Hyperlipidemia Mother   ? Kidney disease Mother   ? Thyroid disease Mother   ? Breast  cancer Mother   ?     mets  ? Lung cancer Mother   ? Diabetes Father   ? Alcohol abuse Sister   ? Sickle cell trait Sister   ? Diabetes Brother   ? Asthma Brother   ? Lung cancer Maternal Grandmother   ? Kidney dis

## 2022-01-11 NOTE — Progress Notes (Signed)
D. Pt presented with a flat affect, brightened upon initial approach- reported that he felt rested this am, and that he got "quality sleep" last night. Per pt's self inventory, pt rated his depression,hopelessness and anxiety a 5/1/7, respectively. Pt has been visible in the milieu interacting appropriately with peers and staff, and observed attending groups. Pt currently denies SI/HI and AVH  ?A. Labs and vitals monitored. Pt given and educated on medications. Pt supported emotionally and encouraged to express concerns and ask questions.   ?R. Pt remains safe with 15 minute checks. Will continue POC. ? ?  ?

## 2022-01-11 NOTE — BHH Counselor (Signed)
Clinical Social Work Note ? ?CSW spoke with both the facility in [place], [facility], and wife to make arrangements for her to take him to the airport on Monday and for the patient to sign a promissory note with the facility.  After this, the facility called back to inform that it had been discovered that the particular insurance policy for patient does not have the facility as in network so the out-of-pocket cost would be $24,000. ? ?CSW informed both the patient and wife.  Wife was instructed to call wife to call [employer] for a list of facilities specifically in network.  CSW was then able to talk to [place] [facility] [phone]) about the patient possibly going to [facility] in [place].  Clinicals were faxed and patient completed an intake call.  Patient initially told the staff he spoke with as well as his wife that he wanted to wait on a decision to have time to compare all his options.  Later in the day, he stated this was stressing out his wife and he needs to go to this facility if accepted. ? ?Selmer Dominion, LCSW ?01/11/2022, 5:08 PM ?  ?

## 2022-01-11 NOTE — Progress Notes (Signed)
?   01/10/22 2140  ?Psych Admission Type (Psych Patients Only)  ?Admission Status Involuntary  ?Psychosocial Assessment  ?Patient Complaints Depression  ?Eye Contact Fair  ?Facial Expression Flat;Sad  ?Affect Appropriate to circumstance  ?Speech Logical/coherent  ?Interaction Assertive  ?Motor Activity Slow  ?Appearance/Hygiene Improved  ?Behavior Characteristics Cooperative;Appropriate to situation  ?Mood Depressed;Pleasant  ?Thought Process  ?Coherency WDL  ?Content WDL  ?Delusions None reported or observed  ?Perception WDL  ?Hallucination None reported or observed  ?Judgment Impaired  ?Confusion None  ?Danger to Self  ?Current suicidal ideation?  ?(denies)  ?Agreement Not to Harm Self Yes  ?Description of Agreement verbally contracts  ?Danger to Others  ?Danger to Others None reported or observed  ? ? ?

## 2022-01-11 NOTE — Group Note (Signed)
[place] LCSW Group Therapy Note ? ?Date/Time:  01/11/2022   ? ?Type of Therapy and Topic:  Group Therapy:  Using Music to Encourage Yourself ? ?Participation Level:  Active  ? ?Description of Group: ?In this process group, members listened to a variety of music through choosing from CSW's list #1 through #25.  Patients identified the messages received from those songs and how the music affected their emotions.  Patients were encouraged to use music as a coping skill at home, but to be mindful of the choices made.  Patients discussed how this knowledge can help with wellness and recovery in various ways including managing depression and anxiety as well as encouraging healthy sleep habits.   ? ?Therapeutic Goals: ?Patients will explore the impact of different songs on mood ?Patients will verbalize the thoughts they have when listening to different types of music ?Patients will identify music that is soothing to them as well as music that is energizing to them ?Patients will discuss how to use this knowledge to assist in maintaining wellness and recovery ?Patients will explore the use of music as a coping skill ?Patients will encourage one another ? ?Summary of Patient Progress:  At the beginning of group, patient expressed that music greatly affects his memories and emotions.  He participated fully and with enthusiasm although he was not effusive like some of the other patients.  At the end of group, patient expressed his mood was improved.   ? ?Therapeutic Modalities: ?Solution Focused Brief Therapy ?Activity ? ? ?Selmer Dominion, LCSW ?  ?

## 2022-01-11 NOTE — [facility] Group Notes (Signed)
Adult Psychoeducational Group  ?Date:  01/11/2022 ?Time:  1300-1400 ? ?Group Topic/Focus: Continuation of the group from Saturday. Looking at the lists that were created and talking about what needs to be done with the homework of 30 positives about themselves.  ?                                   Talking about taking their power back and helping themselves to develop Fernandez positive self esteem. ?     ?Participation Quality:  Appropriate ? ?Affect:  Appropriate ? ?Cognitive:  Oriented ? ?Insight: Improving ? ?Engagement in Group:  Engaged ? ?Modes of Intervention:  Activity, Discussion, Education, and Support ? ?Additional Comments:  Ratres energy at Fernandez 5/10 ? ?Christopher Fernandez ? ? ?

## 2022-01-11 NOTE — [facility] Group Notes (Signed)
Adult Psychoeducational Group Not ?Date:  01/11/2022 ?Time:  9024-0973 ?Group Topic/Focus: PROGRESSIVE RELAXATION. A group where deep breathing is taught and tensing and relaxation muscle groups is used. Imagery is used as well.  Pts are asked to imagine 3 pillars that hold them up when they are not able to hold themselves up and to share that with the group. ? ?Participation Level:  Active ? ?Participation Quality:  Appropriate ? ?Affect:  Appropriate ? ?Cognitive:  Oriented ? ?Insight: Improving ? ?Engagement in Group:  Engaged ? ?Modes of Intervention:  Activity, Discussion, Education, and Support ? ?Additional Comments:  Energy level is a 9/10. States God the Father, and the son, hold him up. ? ?Christopher Fernandez A ? ?

## 2022-01-12 ENCOUNTER — Encounter (HOSPITAL_COMMUNITY): Payer: Self-pay

## 2022-01-12 DIAGNOSIS — F332 Major depressive disorder, recurrent severe without psychotic features: Principal | ICD-10-CM

## 2022-01-12 LAB — GLUCOSE, CAPILLARY
Glucose-Capillary: 146 mg/dL — ABNORMAL HIGH (ref 70–99)
Glucose-Capillary: 85 mg/dL (ref 70–99)
Glucose-Capillary: 193 mg/dL — ABNORMAL HIGH (ref 70–99)

## 2022-01-12 MED ORDER — RIFAMPIN 300 MG PO CAPS
300.0000 mg | ORAL_CAPSULE | Freq: Two times a day (BID) | ORAL | 0 refills | Status: AC
Start: 2022-01-12 — End: 2022-02-11

## 2022-01-12 MED ORDER — NALTREXONE HCL 50 MG PO TABS: 50.0000 mg | ORAL_TABLET | Freq: Every day | ORAL | 0 refills | Status: AC

## 2022-01-12 MED ORDER — INSULIN GLARGINE-YFGN 100 UNIT/ML ~~LOC~~ SOLN: 10.0000 [IU] | Freq: Every day | SUBCUTANEOUS | 0 refills | Status: AC

## 2022-01-12 MED ORDER — EMPAGLIFLOZIN 25 MG PO TABS: 25.0000 mg | ORAL_TABLET | Freq: Every day | ORAL | 0 refills | Status: AC

## 2022-01-12 MED ORDER — DOXYCYCLINE HYCLATE 100 MG PO TABS: 100.0000 mg | ORAL_TABLET | Freq: Two times a day (BID) | ORAL | 0 refills | Status: AC

## 2022-01-12 MED ORDER — METOPROLOL SUCCINATE ER 50 MG PO TB24: 50.0000 mg | ORAL_TABLET | Freq: Every day | ORAL | 0 refills | Status: AC

## 2022-01-12 MED ORDER — OMEPRAZOLE 40 MG PO CPDR: 40.0000 mg | DELAYED_RELEASE_CAPSULE | Freq: Every day | ORAL | 0 refills | Status: AC

## 2022-01-12 MED ORDER — QUETIAPINE FUMARATE 200 MG PO TABS: 200.0000 mg | ORAL_TABLET | Freq: Every day | ORAL | 0 refills | Status: AC

## 2022-01-12 MED ORDER — MONTELUKAST SODIUM 10 MG PO TABS: ORAL_TABLET | Freq: Every day | ORAL | 0 refills | Status: AC

## 2022-01-12 MED ORDER — INSULIN LISPRO (1 UNIT DIAL) 100 UNIT/ML (KWIKPEN): 6.0000 [IU] | PEN_INJECTOR | Freq: Three times a day (TID) | SUBCUTANEOUS | 0 refills | Status: AC

## 2022-01-12 MED ORDER — SITAGLIPTIN PHOSPHATE 100 MG PO TABS: 100.0000 mg | ORAL_TABLET | Freq: Every day | ORAL | 0 refills | Status: AC

## 2022-01-12 MED ORDER — CETIRIZINE HCL 10 MG PO TABS
10.0000 mg | ORAL_TABLET | Freq: Every day | ORAL | 0 refills | Status: AC
Start: 2022-01-12 — End: 2022-02-11

## 2022-01-12 MED ORDER — GABAPENTIN 300 MG PO CAPS: 900.0000 mg | ORAL_CAPSULE | Freq: Three times a day (TID) | ORAL | 0 refills | Status: AC

## 2022-01-12 MED ORDER — MIRTAZAPINE 15 MG PO TABS: 15.0000 mg | ORAL_TABLET | Freq: Every day | ORAL | 0 refills | Status: AC

## 2022-01-12 MED ORDER — CYCLOBENZAPRINE HCL 10 MG PO TABS: 10.0000 mg | ORAL_TABLET | Freq: Three times a day (TID) | ORAL | 0 refills | Status: AC | PRN

## 2022-01-12 MED ORDER — NICOTINE POLACRILEX 2 MG MT GUM: 2.0000 mg | CHEWING_GUM | OROMUCOSAL | 0 refills | Status: AC | PRN

## 2022-01-12 MED ORDER — ALBUTEROL SULFATE HFA 108 (90 BASE) MCG/ACT IN AERS: INHALATION_SPRAY | RESPIRATORY_TRACT | 0 refills | Status: AC

## 2022-01-12 MED ORDER — QUETIAPINE FUMARATE 50 MG PO TABS: 50.0000 mg | ORAL_TABLET | Freq: Every evening | ORAL | 0 refills | Status: AC | PRN

## 2022-01-12 MED ORDER — FLUTICASONE-SALMETEROL 250-50 MCG/ACT IN AEPB: 1.0000 | INHALATION_SPRAY | Freq: Two times a day (BID) | RESPIRATORY_TRACT | 0 refills | Status: AC

## 2022-01-12 MED ORDER — ATORVASTATIN CALCIUM 40 MG PO TABS: 40.0000 mg | ORAL_TABLET | Freq: Every day | ORAL | 0 refills | Status: AC

## 2022-01-12 MED ORDER — VENLAFAXINE HCL ER 75 MG PO CP24: 75.0000 mg | ORAL_CAPSULE | Freq: Every day | ORAL | 0 refills | Status: AC

## 2022-01-12 MED ORDER — METFORMIN HCL 1000 MG PO TABS: 1000.0000 mg | ORAL_TABLET | Freq: Two times a day (BID) | ORAL | 0 refills | Status: AC

## 2022-01-12 MED ORDER — HYDROXYZINE HCL 25 MG PO TABS: 25.0000 mg | ORAL_TABLET | Freq: Three times a day (TID) | ORAL | 0 refills | Status: AC | PRN

## 2022-01-12 NOTE — Discharge Instructions (Signed)
Activity: as tolerated ?  ?Diet: heart healthy, carb modified ?  ?Other: ?-Follow-up with your outpatient psychiatric provider -instructions on appointment date, time, and address (location) are provided to you in discharge paperwork. ?  ?-Take your psychiatric medications as prescribed at discharge - instructions are provided to you in the discharge paperwork ?  ?-Follow-up with outpatient primary care doctor and other specialists -for management of chronic medical disease, including: T2DM, hyperlipidemia ?  ?-Testing: Follow-up with outpatient provider for abnormal lab results: A1c 8.1% ?  ?-Recommend abstinence from alcohol, tobacco, and other illicit drug use at discharge.  ?  ?-If your psychiatric symptoms recur, worsen, or if you have side effects to your psychiatric medications, call your outpatient psychiatric provider, 911, [phone], or go to the nearest emergency department or to the [facility] if in the local area. ?  ?-If suicidal thoughts occur or recur, call your outpatient psychiatric provider, 911, [phone] or go to the nearest emergency department.  ?

## 2022-01-12 NOTE — Progress Notes (Signed)
RN met with pt and reviewed pt's discharge instructions.  Pt verbalized understanding of discharge instructions and pt did not have any questions. RN reviewed and provided pt with a copy of SRA, AVS and Transition Record.  RN returned pt's belongings to pt.  Paper prescriptions and medication samples were given to pt.  Pt denied SI/HI/AVH and voiced no concerns.  Pt was appreciative of the care pt received at [facility].  Patient discharged to the lobby without incident.  

## 2022-01-12 NOTE — [facility] Group Notes (Signed)
Spiritual care group on grief and loss facilitated by chaplain Janne Napoleon, [facility]  ? ?Group Goal:  ? ?Support / Education around grief and loss  ? ?Members engage in facilitated group support and psycho-social education.  ? ?Group Description:  ? ?Following introductions and group rules, group members engaged in facilitated group dialog and support around topic of loss, with particular support around experiences of loss in their lives. Group Identified types of loss (relationships / self / things) and identified patterns, circumstances, and changes that precipitate losses. Reflected on thoughts / feelings around loss, normalized grief responses, and recognized variety in grief experience. Group noted Worden's four tasks of grief in discussion.  ? ?Group drew on Adlerian / Rogerian, narrative, MI,  ? ?Patient Progress: Christopher Fernandez attended group and actively participated and engaged in the conversation.  He was very supportive of peers and he shared some helpful coping skills with the group. ? ?[employer], Bcc ?PAger, [phone] ? ?

## 2022-01-12 NOTE — Progress Notes (Signed)
?  [facility] Adult Case Management Discharge Plan : ? ?Will you be returning to the same living situation after discharge:  No. [facility] in [place] for inpatient substance use treatment ?At discharge, do you have transportation home?: Yes,  [employer]  ?Do you have the ability to pay for your medications: Yes,  Private Insurance/[employer] ? ?Release of information consent forms completed and in the chart;  Patient's signature needed at discharge. ? ?Patient to Follow up at: ? Follow-up Information   ? ? [facility] Follow up on 01/12/2022.   ?Why: You have been accepted with this provider for dual diagnosis residential treatment services including therapy and medication management. ?Contact information: ?Address: [address], [place] [address] ? ?Phone: [phone] ? ?  ?  ? ? [facility] Follow up on 02/16/2022.   ?Why: You have an appointment for medication management services on 02/16/22 at 5:10 pm.  This will be a Virtual appointment. ?Contact information: ?[address] ?[place] [address] ?[phone] ? ? ?  ?  ? ? Kristeen Miss, MD. Call in 1 month(s).   ?Specialty: Neurosurgery ?Why: For follow-up on your back pain. ?Contact information: ?[address] ?[address] ?[place] [address] ?[phone] ? ? ?  ?  ? ?  ?  ? ?  ? ? ?Next level of care provider has access to [facility] ? ?Safety Planning and Suicide Prevention discussed: Yes,  with patient and wife  ? ?  ? ?Has patient been referred to the Quitline?: Patient refused referral ? ?Patient has been referred for addiction treatment: Yes ? ?Darleen Crocker, LCSWA ?01/12/2022, 10:57 AM ?

## 2022-01-12 NOTE — Progress Notes (Signed)
?   01/11/22 2140  ?Psych Admission Type (Psych Patients Only)  ?Admission Status Involuntary  ?Psychosocial Assessment  ?Patient Complaints Worrying  ?Eye Contact Fair  ?Facial Expression Flat;Sad  ?Affect Appropriate to circumstance  ?Speech Logical/coherent  ?Interaction Assertive  ?Motor Activity Slow  ?Appearance/Hygiene Improved  ?Behavior Characteristics Appropriate to situation;Cooperative  ?Mood Pleasant  ?Thought Process  ?Coherency WDL  ?Content WDL  ?Delusions None reported or observed  ?Perception WDL  ?Hallucination None reported or observed  ?Judgment Impaired  ?Confusion None  ?Danger to Self  ?Current suicidal ideation?  ?(denies)  ?Agreement Not to Harm Self Yes  ?Description of Agreement verbally contracts  ?Danger to Others  ?Danger to Others None reported or observed  ? ? ?

## 2022-01-12 NOTE — Group Note (Signed)
Occupational Therapy Group Note ? ?Group Topic:Other  ?Group Date: 01/12/2022 ?Start Time: 1400 ?End Time: 1500 ?Facilitators: Brantley Stage, OT  ? ? ?Routines are sets of habitual activities or practices that we engage in on a regular basis. They can be as simple as a morning cup of coffee or as complex as a daily workout routine. In this essay, we will explore the power of routines in promoting mental health and wellbeing, the reasons why they are important, and how to identify areas of our lives where routines can help improve our overall mental health. Wellbeing The power of routines lies in their ability to provide structure, stability, and predictability in our lives. They help [place] to establish healthy habits and reduce stress and anxiety by minimizing decision fatigue. They also help [place] to manage our time effectively and achieve our goals, which can boost our sense of self-efficacy and confidence. Routines can also foster a sense of community and social connectedness by providing opportunities to engage in shared activities with others. , routines can be a powerful tool in promoting mental health and wellbeing. By providing structure, stability, and predictability in our lives, routines can help [place] to establish healthy habits, reduce stress and anxiety, manage our time effectively, achieve our goals, and foster a sense of community and social connectedness. By identifying areas of our lives where routines can improve our mental health and wellbeing, and by following tips for establishing and maintaining healthy routines, we can harness the power of routines to improve our overall quality of life. ? ? ? ? ?Participation Level: Active ?  ?Participation Quality: Independent ?  ?Behavior: Appropriate ?  ?Speech/Thought Process: Directed, Focused, and Organized ?  ?Affect/Mood: Appropriate ?  ?Insight: Good ?  ?Judgement: Good ?  ?Individualization: Pt was active and engaged in their participation of group  discussion/activity. New routines identified  ?Modes of Intervention: Discussion and Education  ?Patient Response to Interventions:  Attentive, Interested , and Receptive ?  ?Plan: Continue to engage patient in OT groups 2 - 3x/week. ? ?01/12/2022  ?Brantley Stage, OT ?Cornell Barman, OT ? ? ? ? ?

## 2022-01-12 NOTE — BH IP Treatment Plan (Signed)
Interdisciplinary Treatment and Diagnostic Plan Update ? ?01/12/2022 ?Time of Session: 9:45am ?Christopher Fernandez ?MRN: [id number] ? ?Principal Diagnosis: MDD (major depressive disorder), recurrent severe, without psychosis ([place]) ? ?Secondary Diagnoses: Principal Problem: ?  MDD (major depressive disorder), recurrent severe, without psychosis ([place]) ?Active Problems: ?  Alcohol use disorder, severe, dependence ([place]) ?  Opioid use disorder, severe, in sustained remission ([place]) ?  GAD (generalized anxiety disorder) ? ? ?Current Medications:  ?Current Facility-Administered Medications  ?Medication Dose Route Frequency Provider Last Rate Last Admin  ? acetaminophen (TYLENOL) tablet 650 mg  650 mg Oral Q6H PRN Janine Limbo, MD   650 mg at 01/12/22 8502  ? atorvastatin (LIPITOR) tablet 40 mg  40 mg Oral Daily Lindon Romp A, NP   40 mg at 01/12/22 0751  ? cyclobenzaprine (FLEXERIL) tablet 10 mg  10 mg Oral TID PRN Rozetta Nunnery, NP   10 mg at 01/12/22 7741  ? doxycycline (VIBRA-TABS) tablet 100 mg  100 mg Oral BID Lindon Romp A, NP   100 mg at 01/12/22 0750  ? empagliflozin (JARDIANCE) tablet 25 mg  25 mg Oral Daily Lindon Romp A, NP   25 mg at 01/12/22 0751  ? gabapentin (NEURONTIN) capsule 900 mg  900 mg Oral TID Laretta Bolster, FNP   900 mg at 01/12/22 0748  ? ibuprofen (ADVIL) tablet 400 mg  400 mg Oral Q8H PRN Lindon Romp A, NP   400 mg at 01/11/22 2132  ? insulin aspart (novoLOG) injection 0-15 Units  0-15 Units Subcutaneous TID WC Rozetta Nunnery, NP   2 Units at 01/12/22 [phone]  ? insulin aspart (novoLOG) injection 0-5 Units  0-5 Units Subcutaneous QHS Rozetta Nunnery, NP   2 Units at 01/10/22 2140  ? insulin glargine-yfgn (SEMGLEE) injection 10 Units  10 Units Subcutaneous QHS Janine Limbo, MD   10 Units at 01/11/22 2137  ? linagliptin (TRADJENTA) tablet 5 mg  5 mg Oral Daily Lindon Romp A, NP   5 mg at 01/12/22 6767  ? loratadine (CLARITIN) tablet 10 mg  10 mg Oral Daily Lindon Romp A, NP   10 mg at  01/12/22 0751  ? metFORMIN (GLUCOPHAGE) tablet 1,000 mg  1,000 mg Oral BID WC Lindon Romp A, NP   1,000 mg at 01/12/22 0751  ? metoprolol succinate (TOPROL-XL) 24 hr tablet 50 mg  50 mg Oral Daily Massengill, Ovid Curd, MD   50 mg at 01/12/22 0751  ? mirtazapine (REMERON) tablet 15 mg  15 mg Oral QHS Massengill, Ovid Curd, MD   15 mg at 01/11/22 2132  ? mometasone-formoterol (DULERA) 200-5 MCG/ACT inhaler 2 puff  2 puff Inhalation BID Lindon Romp A, NP   2 puff at 01/12/22 0753  ? montelukast (SINGULAIR) tablet 10 mg  10 mg Oral Irish Elders A, NP   10 mg at 01/12/22 2094  ? multivitamin with minerals tablet 1 tablet  1 tablet Oral Daily Lindon Romp A, NP   1 tablet at 01/12/22 0751  ? naltrexone (DEPADE) tablet 50 mg  50 mg Oral Daily Massengill, Ovid Curd, MD   50 mg at 01/12/22 0751  ? nicotine polacrilex (NICORETTE) gum 2 mg  2 mg Oral PRN Harlow Asa, MD   2 mg at 01/12/22 7096  ? pantoprazole (PROTONIX) EC tablet 40 mg  40 mg Oral Daily Lindon Romp A, NP   40 mg at 01/12/22 0751  ? QUEtiapine (SEROQUEL) tablet 200 mg  200 mg Oral QHS Janine Limbo, MD  200 mg at 01/11/22 2132  ? QUEtiapine (SEROQUEL) tablet 50 mg  50 mg Oral QHS PRN Janine Limbo, MD   50 mg at 01/11/22 2133  ? rifampin (RIFADIN) capsule 300 mg  300 mg Oral BID Lindon Romp A, NP   300 mg at 01/12/22 0750  ? thiamine tablet 100 mg  100 mg Oral Daily Lindon Romp A, NP   100 mg at 01/12/22 0751  ? venlafaxine XR (EFFEXOR-XR) 24 hr capsule 75 mg  75 mg Oral Q breakfast Massengill, Ovid Curd, MD   75 mg at 01/12/22 0750  ? ?PTA Medications: ?Facility-Administered Medications Prior to Admission  ?Medication Dose Route Frequency Provider Last Rate Last Admin  ? 0.9 %  sodium chloride infusion  500 mL Intravenous Once Mansouraty, Telford Nab., MD      ? 0.9 %  sodium chloride infusion  500 mL Intravenous Once Mansouraty, Telford Nab., MD      ? ?Medications Prior to Admission  ?Medication Sig Dispense Refill Last Dose  ? acetaminophen  (TYLENOL) 500 MG tablet Take 2 tablets (1,000 mg total) by mouth every 6 (six) hours. (Patient not taking: Reported on 01/05/2022) 30 tablet 0   ? albuterol (VENTOLIN HFA) 108 (90 Base) MCG/ACT inhaler INHALE 2 TO 4 PUFFS INTO THE LUNGS EVERY 4 HOURS AS NEEDED FOR WHEEZING OR COUGH. (Patient taking differently: Inhale 2-4 puffs into the lungs every 4 (four) hours as needed for wheezing or shortness of breath.) 18 g 11   ? ARIPiprazole (ABILIFY) 5 MG tablet Take 1 tablet (5 mg total) by mouth daily. 90 tablet 2   ? atorvastatin (LIPITOR) 40 MG tablet Take 1 tablet (40 mg total) by mouth daily. 90 tablet 3   ? cetirizine (ZYRTEC) 10 MG tablet Take 1 tablet (10 mg total) by mouth daily. (Patient taking differently: Take 10 mg by mouth daily as needed for allergies or rhinitis.) 30 tablet 11   ? Continuous Blood Gluc Receiver (DEXCOM G6 RECEIVER) DEVI 1 Device by Does not apply route 4 (four) times daily. 1 each 11   ? Continuous Blood Gluc Fernandez (DEXCOM G6 Fernandez) MISC USE AS DIRECTED 4 TIMES DAILY 3 each 11   ? Continuous Blood Gluc Transmit (DEXCOM G6 TRANSMITTER) MISC 4 (four) times daily. as directed     ? Continuous Blood Gluc Transmit (DEXCOM G6 TRANSMITTER) MISC USE AS DIRECTED FOUR TIMES DAILY 1 each 11   ? cyclobenzaprine (FLEXERIL) 10 MG tablet Take 1 tablet (10 mg total) by mouth 3 (three) times daily. (Patient taking differently: Take 10 mg by mouth 3 (three) times daily as needed for muscle spasms.) 90 tablet 2   ? doxycycline (VIBRA-TABS) 100 MG tablet Take 1 tablet (100 mg total) by mouth 2 (two) times daily. 60 tablet 11   ? DULoxetine (CYMBALTA) 60 MG capsule Take 1 capsule (60 mg total) by mouth daily. 90 capsule 2   ? EASY TOUCH PEN NEEDLES 31G X 5 MM MISC Inject 1 application as directed 3 (three) times daily. 100 each 11   ? empagliflozin (JARDIANCE) 25 MG TABS tablet Take 1 tablet (25 mg total) by mouth daily. 90 tablet 3   ? fluticasone-salmeterol (ADVAIR DISKUS) 250-50 MCG/ACT AEPB Inhale 1 puff  into the lungs in the morning and at bedtime. 60 each 2   ? gabapentin (NEURONTIN) 300 MG capsule Take 3 capsules (900 mg total) by mouth 3 (three) times daily. 270 capsule 2   ? hydrOXYzine (ATARAX) 25 MG tablet Take 1 tablet (  25 mg total) by mouth at bedtime. 30 tablet 0   ? hydrOXYzine (VISTARIL) 25 MG capsule Take 1 capsule (25 mg total) by mouth at bedtime as needed for anxiety. (Patient not taking: Reported on 01/05/2022) 30 capsule 1   ? ibuprofen (ADVIL) 400 MG tablet Take 1 tablet (400 mg total) by mouth every 8 (eight) hours as needed for mild pain. 60 tablet 0   ? insulin glargine-yfgn (SEMGLEE, YFGN,) 100 UNIT/ML Pen Inject 10 Units into the skin daily. (Patient taking differently: Inject 10 Units into the skin at bedtime.) 15 mL 1   ? insulin lispro (HUMALOG KWIKPEN) 100 UNIT/ML KwikPen Inject 6-10 Units into the skin 3 (three) times daily. (Patient taking differently: Inject 6-10 Units into the skin 3 (three) times daily before meals.) 15 mL 11   ? Lancets (FREESTYLE) lancets Use as instructed 100 each 12   ? metFORMIN (GLUCOPHAGE) 1000 MG tablet Take 1 tablet (1,000 mg total) by mouth 2 (two) times daily with a meal. 180 tablet 3   ? metoprolol succinate (TOPROL-XL) 100 MG 24 hr tablet Take 1.5 tablets (150 mg total) by mouth at bedtime. Take with or immediately following a meal. (Patient not taking: Reported on 01/05/2022) 120 tablet 3   ? mirtazapine (REMERON) 30 MG tablet Take 1 tablet (30 mg total) by mouth at bedtime. 90 tablet 3   ? montelukast (SINGULAIR) 10 MG tablet TAKE 1 TABLET (10 MG TOTAL) BY MOUTH AT BEDTIME. (Patient taking differently: Take 10 mg by mouth in the morning.) 90 tablet 11   ? naltrexone (DEPADE) 50 MG tablet Take 1 tablet (50 mg total) by mouth daily. 30 tablet 0   ? omeprazole (PRILOSEC) 40 MG capsule Take 1 capsule (40 mg total) by mouth daily. (Patient taking differently: Take 40 mg by mouth daily before breakfast.) 30 capsule 3   ? QUEtiapine (SEROQUEL) 100 MG tablet  Take 1 tablet (100 mg total) by mouth at bedtime. (Patient not taking: Reported on 01/05/2022) 30 tablet 0   ? QUEtiapine (SEROQUEL) 200 MG tablet Take 1 tablet (200 mg total) by mouth at bedtime. 30 tablet 0

## 2022-01-12 NOTE — Discharge Summary (Signed)
Physician Discharge Summary Note ? ?Patient:  Christopher Fernandez is an 51 y.o., male ?MRN:  [id number] ?DOB:  11-Aug-1971 ?Patient phone:  [phone] (home)  ?Patient address:   ?[address] ?[place] [address],  ?Total Time spent with patient: 20 minutes ? ?Date of Admission:  01/06/2022 ?Date of Discharge: 01/12/2022 ? ?Reason for Admission:   ?Patient is a 51 year old male with a past psychiatric history of major depressive disorder, generalized anxiety disorder, and alcohol use disorder severe, who was admitted to the psychiatric hospital for evaluation of worsening depression, concern for suicidal thoughts, and alcohol withdrawal. ?Patient was medically cleared by [facility] prior to admission to the psychiatric hospital. ? ?Principal Problem: MDD (major depressive disorder), recurrent severe, without psychosis ([place]) ?Discharge Diagnoses: Principal Problem: ?  MDD (major depressive disorder), recurrent severe, without psychosis ([place]) ?Active Problems: ?  Alcohol use disorder, severe, dependence ([place]) ?  Opioid use disorder, severe, in sustained remission ([place]) ?  GAD (generalized anxiety disorder) ? ? ?Past Psychiatric History:  ?Past psychiatric history: ?MDD, GAD, insomnia ?Denies past psychiatric hospitalization ?Reports 1 suicide attempt about 30 years ago by overdose ?Patient reports past psychiatric medication trials include: Prozac, Paxil, Celexa, Seroquel, trazodone, Risperdal, Abilify, gabapentin, Remeron, hydroxyzine, Cymbalta ?Patient reports he has not tried: Lexapro, Effexor, Zyprexa, lithium, Depakote, Trileptal, Tegretol ?Prior to admission psychiatric medications: See above ? ?Past Medical History:  ?Past Medical History:  ?Diagnosis Date  ? Alcoholism ([place])   ? Allergy   ? Anemia   ? Anxiety   ? Arthritis   ? knees, hands, lower back  ? Asthma   ? Blood transfusion without reported diagnosis   ? COPD (chronic obstructive pulmonary disease) ([place])   ? COVID-19 in  immunocompromised patient ([place]) 02/21/2021  ? Depression   ? Diabetes ([place])   ? type 2  ? GERD (gastroesophageal reflux disease)   ? History of hiatal hernia   ? HTN (hypertension)   ? Mass of right chest wall 02/21/2021  ? MRSA bacteremia   ? MRSA infection 01/11/2020  ? Neuromuscular disorder ([place])   ? tremors  ? Osteomyelitis ([place])   ? right forefoot  ? Pancreatitis   ? Pneumonia   ? Substance abuse ([place])   ? Tuberculosis   ? treated for exposure  ? Wears glasses   ?  ?Past Surgical History:  ?Procedure Laterality Date  ? Amputation Right   ? 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  ? AMPUTATION Right 10/18/2019  ? Procedure: RIGHT SECOND TOE AMPUTATION;  Surgeon: Newt Minion, MD;  Location: [place];  Service: Orthopedics;  Laterality: Right;  ? AMPUTATION Right 11/08/2019  ? Procedure: RIGHT TRANSMETATARSAL AMPUTATION;  Surgeon: Newt Minion, MD;  Location: [place];  Service: Orthopedics;  Laterality: Right;  ? AMPUTATION Left 12/02/2020  ? Procedure: AMPUTATION 2ND TOE;  Surgeon: Elam Dutch, MD;  Location: [place];  Service: Vascular;  Laterality: Left;  ? AMPUTATION Left 02/24/2021  ? Procedure: PARTIAL LEFT THIRD TOE AMPUTATION;  Surgeon: [name], MD;  Location: [place];  Service: Vascular;  Laterality: Left;  ? APPLICATION OF WOUND VAC Right 01/10/2020  ? Procedure: APPLICATION OF WOUND VAC, right foot;  Surgeon: [name], MD;  Location: [place];  Service: Vascular;  Laterality: Right;  ? BILIARY DILATION  12/06/2020  ? Procedure: BILIARY DILATION;  Surgeon: [place] Landmark Telford Nab., MD;  Location: [place];  Service: Gastroenterology;;  ? BIOPSY  12/06/2020  ? Procedure: BIOPSY;  Surgeon:  Mansouraty, Telford Nab., MD;  Location: [place];  Service: Gastroenterology;;  ? CHOLECYSTECTOMY  2006  ? with gallbladder and spleen  ? COLONOSCOPY  8-10 years ago   ? in [place] exam per pt  ? ENDOSCOPIC RETROGRADE CHOLANGIOPANCREATOGRAPHY (ERCP) WITH PROPOFOL N/A 12/06/2020  ? Procedure:  ENDOSCOPIC RETROGRADE CHOLANGIOPANCREATOGRAPHY (ERCP) WITH PROPOFOL;  Surgeon: [place] Landmark Telford Nab., MD;  Location: [place];  Service: Gastroenterology;  Laterality: N/A;  ? ESOPHAGUS SURGERY    ? HERNIA REPAIR  2008, 2010  ? hiatal hernia and 1 additional  ? NASAL SEPTUM SURGERY    ? PANCREATIC PSEUDOCYST DRAINAGE    ? PANCREATIC STENT PLACEMENT  12/06/2020  ? Procedure: PANCREATIC STENT PLACEMENT;  Surgeon: [place] Landmark Telford Nab., MD;  Location: [place];  Service: Gastroenterology;;  ? PLEURAL EFFUSION DRAINAGE Right 01/10/2021  ? Procedure: DRAINAGE OF PLEURAL EFFUSION;  Surgeon: Gaye Pollack, MD;  Location: [place];  Service: Thoracic;  Laterality: Right;  ? REMOVAL OF STONES  12/06/2020  ? Procedure: REMOVAL OF STONES;  Surgeon: [place] Landmark Telford Nab., MD;  Location: [place];  Service: Gastroenterology;;  ? SPHINCTEROTOMY  12/06/2020  ? Procedure: SPHINCTEROTOMY;  Surgeon: Mansouraty, Telford Nab., MD;  Location: [facility] ENDOSCOPY;  Service: Gastroenterology;;  ? SPLENECTOMY  2006  ? STUMP REVISION Right 11/24/2019  ? Procedure: REVISION RIGHT TRANSMETATARSAL AMPUTATION;  Surgeon: Newt Minion, MD;  Location: [place];  Service: Orthopedics;  Laterality: Right;  ? TEE WITHOUT CARDIOVERSION N/A 12/04/2020  ? Procedure: TRANSESOPHAGEAL ECHOCARDIOGRAM (TEE);  Surgeon: Acie Fredrickson Wonda Cheng, MD;  Location: [place];  Service: Cardiovascular;  Laterality: N/A;  ? THORACIC EXPOSURE Right 01/03/2021  ? Procedure: THORACIC EXPOSURE with removal of rib;  Surgeon: Gaye Pollack, MD;  Location: [place];  Service: Cardiothoracic;  Laterality: Right;  ? THORACOTOMY Right 01/10/2021  ? Procedure: REDO THORACOTOMY;  Surgeon: Gaye Pollack, MD;  Location: [facility] OR;  Service: Thoracic;  Laterality: Right;  ? [place] VERTEBRECTOMY Right 01/03/2021  ? Procedure: Decompression of  Thoracic nine and Thoracic ten via corpectomy reconstruction with titanium spacer and rib autogrtaft. Lateral plate fixation Thoracic  eight - Thoracic eleven;  Surgeon: Kristeen Miss, MD;  Location: [place];  Service: Neurosurgery;  Laterality: Right;  ? TOTAL KNEE ARTHROPLASTY Right 1982, 1984  ? TRANSMETATARSAL AMPUTATION Left 09/06/2020  ? Procedure: AMPUTATION LEFT GREAT TOE;  Surgeon: Waynetta Sandy, MD;  Location: [place];  Service: Vascular;  Laterality: Left;  ? WOUND DEBRIDEMENT Right 01/10/2020  ? Procedure: incisional DEBRIDEMENT of RIGHT TRANSMETATARSAL WOUND;  Surgeon: [name], MD;  Location: [place];  Service: Vascular;  Laterality: Right;  ? ?Family History:  ?Family History  ?Problem Relation Age of Onset  ? Diabetes Mother   ? Hypertension Mother   ? Hyperlipidemia Mother   ? Kidney disease Mother   ? Thyroid disease Mother   ? Breast cancer Mother   ?     mets  ? Lung cancer Mother   ? Diabetes Father   ? Alcohol abuse Sister   ? Sickle cell trait Sister   ? Diabetes Brother   ? Asthma Brother   ? Lung cancer Maternal Grandmother   ? Kidney disease Maternal Grandmother   ? Liver cancer Maternal Uncle   ?     x 4-5  ? Kidney disease Maternal Uncle   ? Kidney disease Paternal Uncle   ? Colon cancer Neg Hx   ? Colon polyps Neg Hx   ? Esophageal cancer Neg Hx   ?  Rectal cancer Neg Hx   ? Stomach cancer Neg Hx   ? ?Family Psychiatric  History: Reports sister diagnosed with depression.  Reports sister attempted suicide. ?Social History:  ? Born and raised in [place] , moved to [place] area about 3 years ago due to [employer] job.  Is married.  Has 1 son. ?Substance use history: ?Patient reports using alcohol since teenage years.  Identifies as an alcoholic.  Reports last use was Sunday, 4 days prior to this evaluation.  Reports significant alcohol withdrawal symptoms in the past, and inpatient rehabilitation therapy at least 11 times.  Denies history of alcohol withdrawal seizures. ?Patient reports using heroin for many years, last use 10 years ago. ? ?Social History  ? ?Substance and Sexual Activity   ?Alcohol Use Not Currently  ? Comment: 2-3 per day, none since 2019  ?   ?Social History  ? ?Substance and Sexual Activity  ?Drug Use Not Currently  ?  ?Social History  ? ?Socioeconomic History  ? Marital status: Mar

## 2022-01-12 NOTE — [facility] Suicide Risk Assessment (Addendum)
Suicide Risk Assessment ? ?Discharge Assessment    ?[facility] Discharge Suicide Risk Assessment ? ? ?Principal Problem: MDD (major depressive disorder), recurrent severe, without psychosis ([place]) ?Discharge Diagnoses: Principal Problem: ?  MDD (major depressive disorder), recurrent severe, without psychosis ([place]) ?Active Problems: ?  Alcohol use disorder, severe, dependence ([place]) ?  Opioid use disorder, severe, in sustained remission ([place]) ?  GAD (generalized anxiety disorder) ? ? ?Total Time spent with patient: 20 minutes ? ?Patient is a 51 year old male with a past psychiatric history of major depressive disorder, generalized anxiety disorder, and alcohol use disorder severe, who was admitted to the psychiatric hospital for evaluation of worsening depression, concern for suicidal thoughts, and alcohol withdrawal. ?Patient was medically cleared by [facility] prior to admission to the psychiatric hospital. ? ?Prior to admission psychiatric medications: Cymbalta 60 mg once daily, Seroquel 200 mg nightly, Remeron 30 mg nightly, gabapentin 900 mg 3 times daily, hydroxyzine 25 mg as needed (patient had stopped Abilify 5 mg once daily a few weeks ago due to ineffectiveness). ? ?Musculoskeletal: ?Strength & Muscle Tone: within normal limits ?Gait & Station: normal, but slowed ?Patient leans: N/A ? ?Psychiatric Specialty Exam ? ?Presentation  ?General Appearance: Casual; Fairly Groomed; Appropriate for Environment ? ?Eye Contact:Good ? ?Speech:Normal Rate ? ?Speech Volume:Normal ? ?Handedness:Right ? ? ?Mood and Affect  ?Mood:Euthymic ? ?Duration of Depression Symptoms: Greater than two weeks ? ?Affect:Appropriate; Congruent; Full Range ? ? ?Thought Process  ?Thought Processes:Linear ? ?Descriptions of Associations:Intact ? ?Orientation:Full (Time, Place and Person) ? ?Thought Content:Logical ? ?History of Schizophrenia/Schizoaffective disorder:No ? ?Duration of Psychotic Symptoms:No data  recorded ?Hallucinations:Hallucinations: None ? ?Ideas of Reference:None ? ?Suicidal Thoughts:Suicidal Thoughts: No ? ?Homicidal Thoughts:Homicidal Thoughts: No ? ? ?Sensorium  ?Memory:Immediate Good; Recent Good; Remote Good ? ?Judgment:Good ? ?Insight:Good ? ? ?Executive Functions  ?Concentration:Good ? ?Attention Span:Good ? ?Recall:Fair ? ?[place] ? ?Language:Good ? ? ?Psychomotor Activity  ?Psychomotor Activity:Psychomotor Activity: Normal ? ? ?Assets  ?Assets:Communication Skills; Financial Resources/Insurance; Physical Health ? ? ?Sleep  ?Sleep:Sleep: Fair ?Number of Hours of Sleep: 5 ? ? ?Physical Exam: ?Physical Exam See discharge summary ?ROS See discharge summary ?Blood pressure 116/83, pulse 90, temperature 97.9 ?F (36.6 ?C), resp. rate 17, height '6\' 3"'$  (1.905 m), weight 102.1 kg, SpO2 99 %. Body mass index is 28.12 kg/m?. ? ?Mental Status Per Nursing Assessment::   ?On Admission:  Self-harm thoughts, Belief that plan would result in death, Plan to harm others ? ?Demographic Factors:  ?Male ? ?Loss Factors: ?Decline in physical health ? ?Historical Factors: ?Prior suicide attempts and Impulsivity ? ?Risk Reduction Factors:   ?Positive coping skills or problem solving skills ? ?Continued Clinical Symptoms:  ?Anxiety ? ?Cognitive Features That Contribute To Risk:  ?None   ? ?Suicide Risk:  ?Mild: There are no identifiable plans, no associated intent, mild dysphoria and related symptoms, good self-control (both objective and subjective assessment), few other risk factors, and identifiable protective factors, including available and accessible social support. ? ? Follow-up Information   ? ? [facility] Follow up.   ?Why: You have been accepted with this provider for dual diagnosis residential treatment services including therapy and medication management. ?Contact information: ?[address] ?[place] [address] ? ?P:  [phone], Clydene Laming ?F:  [phone] ? ?  ?  ? ? [facility] Follow up on 02/16/2022.   ?Why: You have an appointment for medication management services on 02/16/22 at 5:10 pm.  This will be a Virtual appointment. ?  Contact information: ?[address] ?[place] [address] ?[phone] ? ? ?  ?  ? ? Kristeen Miss, MD. Call in 1 month(s).   ?Specialty: Neurosurgery ?Why: For follow-up on your back pain. ?Contact information: ?[address] ?[address] ?[place] [address] ?[phone] ? ? ?  ?  ? ?  ?  ? ?  ? ? ?Plan Of Care/Follow-up recommendations:  ?Activity: as tolerated ? ?Diet: heart healthy, carb modified ? ?Other: ?-Follow-up with your outpatient psychiatric provider -instructions on appointment date, time, and address (location) are provided to you in discharge paperwork. ? ?-Take your psychiatric medications as prescribed at discharge - instructions are provided to you in the discharge paperwork ? ?-Follow-up with outpatient primary care doctor and other specialists -for management of chronic medical disease, including: T2DM, hyperlipidemia ? ?-Testing: Follow-up with outpatient provider for abnormal lab results: A1c 8.1% ? ?-Recommend abstinence from alcohol, tobacco, and other illicit drug use at discharge.  ? ?-If your psychiatric symptoms recur, worsen, or if you have side effects to your psychiatric medications, call your outpatient psychiatric provider, [phone], [phone] or go to the nearest emergency department. ? ?-If suicidal thoughts recur, call your outpatient psychiatric provider, 911, [phone] or go to the nearest emergency department.  ? ?Rosezetta Schlatter, MD ?01/12/2022, 10:30 AM ? ?Total Time Spent in Direct Patient Care:  ?I personally spent 45 minutes on the unit in direct patient care. The direct patient care time included face-to-face time with the patient, reviewing the patient's chart, communicating with other professionals, and coordinating care. Greater than 50% of this time was spent in counseling or coordinating care with the patient  regarding goals of hospitalization, psycho-education, and discharge planning needs. ? ?On my assessment the patient denied SI, HI, AVH, paranoia, ideas of reference, or first rank symptoms on day of discharge. Patient denied drug cravings or active signs of withdrawal. Patient denied medication side-effects. Patient was not deemed to be a danger to self or others on day of discharge and was in agreement with discharge plans.  ? ?I have independently evaluated the patient during a face-to-face assessment on the day of discharge. I reviewed the patient's chart, and I participated in key portions of the service. I discussed the case with the resident physician, and I agree with the assessment and plan of care as documented in the resident physician's note, as addended by me or notated below: ? ?I agree with the SRA. ? ? ?Janine Limbo, MD ?Psychiatrist  ? ?

## 2022-01-12 NOTE — Group Note (Signed)
Recreation Therapy Group Note ? ? ?Group Topic:Stress Management  ?Group Date: 01/12/2022 ?Start Time: 0930 ?End Time: 1000 ?Facilitators: Victorino Sparrow, LRT,CTRS ?Location: [place] ? ? ?Goal Area(s) Addresses:  ?Patient will actively participate in stress management techniques presented during session.  ?Patient will successfully identify benefit of practicing stress management post d/c.  ? ?Group Description: Guided Imagery. LRT provided education, instruction, and demonstration on practice of visualization via guided imagery. Patient was asked to participate in the technique introduced during session. LRT debriefed including topics of mindfulness, stress management and specific scenarios each patient could use these techniques. Patients were given suggestions of ways to access scripts post d/c and encouraged to explore Youtube and other apps available on smartphones, tablets, and computers. ? ? ?Affect/Mood: N/A ?  ?Participation Level: N/A ?  ? ?Clinical Observations/Individualized Feedback:  Group did not occur due to orientation group starting extremely late, running over and using up group time.  ? ? ?Plan: Continue to engage patient in RT group sessions 2-3x/week. ? ? ?Victorino Sparrow, LRT,CTRS ?01/12/2022 2:24 PM ?

## 2022-01-12 NOTE — Group Note (Signed)
LCSW Group Therapy Note ? ? ?Group Date: 01/12/2022 ?Start Time: 1300 ?End Time: 1400 ? ?Type of Therapy and Topic:  Group Therapy: Thoughts, Feelings, and Actions ? ?Participation Level:  Active ? ? ?Description of Group:   ?In this group, each patient discussed their previous experiencing and understanding of overthinking, identifying the harmful impact on their lives. As a group, each patient was introduced to the basic concepts of Cognitive Behavioral Therapy: that thoughts, feelings, and actions are all connected and influence one another. They were given examples of how overthinking can affect our feelings, actions, and vise versa. The group was then asked to analyze how overthinking was harmful and brainstorm alternative thinking patterns/reactions to the example situation. Then, each group member filled out and identified their own example situation in which a problem situation caused their thoughts, feelings, and actions to be negatively impacted; they were asked to come up with 3 new (more adaptive/positive) thoughts that led to 3 new feelings and actions. ? ?Therapeutic Goals: ?Patients will review and discuss their past experience with overthinking. ?Patients will learn the basics of the CBT model through group-led examples.[name] ?Patients will identify situations where they may have negative thoughts, feelings, or actions and will then reframe the situation using more positive thoughts to react differently. ? ?Summary of Patient Progress:  The patient shared that they feel compassion today. Patient contributed to the discussion of how thoughts, feelings, and actions interact, noting when they may have experienced a negative thought pattern and recognized it as harmful. They were attentive when other patients shared their experiences, and worked to reframe their own thoughts in an activity to identify future situations where they may typically overthink. ? ?Therapeutic Modalities:   ?Cognitive Behavioral  Therapy ?Mindfulness ? ?Darleen Crocker, LCSWA ?01/12/2022  2:06 PM   ? ?

## 2022-01-13 DIAGNOSIS — F1020 Alcohol dependence, uncomplicated: Secondary | ICD-10-CM | POA: Diagnosis not present

## 2022-01-14 DIAGNOSIS — F1020 Alcohol dependence, uncomplicated: Secondary | ICD-10-CM | POA: Diagnosis not present

## 2022-01-15 DIAGNOSIS — F1020 Alcohol dependence, uncomplicated: Secondary | ICD-10-CM | POA: Diagnosis not present

## 2022-01-16 DIAGNOSIS — F1020 Alcohol dependence, uncomplicated: Secondary | ICD-10-CM | POA: Diagnosis not present

## 2022-01-17 DIAGNOSIS — F1020 Alcohol dependence, uncomplicated: Secondary | ICD-10-CM | POA: Diagnosis not present

## 2022-01-18 DIAGNOSIS — F1020 Alcohol dependence, uncomplicated: Secondary | ICD-10-CM | POA: Diagnosis not present

## 2022-01-19 DIAGNOSIS — F1020 Alcohol dependence, uncomplicated: Secondary | ICD-10-CM | POA: Diagnosis not present

## 2022-01-20 DIAGNOSIS — F1020 Alcohol dependence, uncomplicated: Secondary | ICD-10-CM | POA: Diagnosis not present

## 2022-01-21 DIAGNOSIS — F1020 Alcohol dependence, uncomplicated: Secondary | ICD-10-CM | POA: Diagnosis not present

## 2022-01-22 DIAGNOSIS — F1020 Alcohol dependence, uncomplicated: Secondary | ICD-10-CM | POA: Diagnosis not present

## 2022-01-23 DIAGNOSIS — F1020 Alcohol dependence, uncomplicated: Secondary | ICD-10-CM | POA: Diagnosis not present

## 2022-01-24 DIAGNOSIS — F1020 Alcohol dependence, uncomplicated: Secondary | ICD-10-CM | POA: Diagnosis not present

## 2022-01-25 DIAGNOSIS — F1020 Alcohol dependence, uncomplicated: Secondary | ICD-10-CM | POA: Diagnosis not present

## 2022-01-26 DIAGNOSIS — F1020 Alcohol dependence, uncomplicated: Secondary | ICD-10-CM | POA: Diagnosis not present

## 2022-01-27 DIAGNOSIS — F1020 Alcohol dependence, uncomplicated: Secondary | ICD-10-CM | POA: Diagnosis not present

## 2022-01-28 DIAGNOSIS — F1020 Alcohol dependence, uncomplicated: Secondary | ICD-10-CM | POA: Diagnosis not present

## 2022-01-29 DIAGNOSIS — F1020 Alcohol dependence, uncomplicated: Secondary | ICD-10-CM | POA: Diagnosis not present

## 2022-01-30 DIAGNOSIS — F1020 Alcohol dependence, uncomplicated: Secondary | ICD-10-CM | POA: Diagnosis not present

## 2022-01-31 DIAGNOSIS — F1020 Alcohol dependence, uncomplicated: Secondary | ICD-10-CM | POA: Diagnosis not present

## 2022-02-01 DIAGNOSIS — F1020 Alcohol dependence, uncomplicated: Secondary | ICD-10-CM | POA: Diagnosis not present

## 2022-02-02 DIAGNOSIS — F1020 Alcohol dependence, uncomplicated: Secondary | ICD-10-CM | POA: Diagnosis not present

## 2022-02-03 DIAGNOSIS — F1020 Alcohol dependence, uncomplicated: Secondary | ICD-10-CM | POA: Diagnosis not present

## 2022-02-04 DIAGNOSIS — F1020 Alcohol dependence, uncomplicated: Secondary | ICD-10-CM | POA: Diagnosis not present

## 2022-02-05 ENCOUNTER — Other Ambulatory Visit (HOSPITAL_COMMUNITY): Payer: Self-pay

## 2022-02-05 DIAGNOSIS — F1020 Alcohol dependence, uncomplicated: Secondary | ICD-10-CM | POA: Diagnosis not present

## 2022-02-06 DIAGNOSIS — F1020 Alcohol dependence, uncomplicated: Secondary | ICD-10-CM | POA: Diagnosis not present

## 2022-02-07 DIAGNOSIS — F1020 Alcohol dependence, uncomplicated: Secondary | ICD-10-CM | POA: Diagnosis not present

## 2022-02-08 DIAGNOSIS — F1020 Alcohol dependence, uncomplicated: Secondary | ICD-10-CM | POA: Diagnosis not present

## 2022-02-09 DIAGNOSIS — F1020 Alcohol dependence, uncomplicated: Secondary | ICD-10-CM | POA: Diagnosis not present

## 2022-02-10 ENCOUNTER — Other Ambulatory Visit (HOSPITAL_COMMUNITY): Payer: Self-pay

## 2022-02-10 DIAGNOSIS — F1020 Alcohol dependence, uncomplicated: Secondary | ICD-10-CM | POA: Diagnosis not present

## 2022-02-11 ENCOUNTER — Other Ambulatory Visit (HOSPITAL_COMMUNITY): Payer: Self-pay

## 2022-02-11 DIAGNOSIS — F1020 Alcohol dependence, uncomplicated: Secondary | ICD-10-CM | POA: Diagnosis not present

## 2022-02-12 DIAGNOSIS — F1020 Alcohol dependence, uncomplicated: Secondary | ICD-10-CM | POA: Diagnosis not present

## 2022-02-13 DIAGNOSIS — F1020 Alcohol dependence, uncomplicated: Secondary | ICD-10-CM | POA: Diagnosis not present

## 2022-02-14 DIAGNOSIS — F1020 Alcohol dependence, uncomplicated: Secondary | ICD-10-CM | POA: Diagnosis not present

## 2022-02-15 DIAGNOSIS — F1020 Alcohol dependence, uncomplicated: Secondary | ICD-10-CM | POA: Diagnosis not present

## 2022-02-16 DIAGNOSIS — F1020 Alcohol dependence, uncomplicated: Secondary | ICD-10-CM | POA: Diagnosis not present

## 2022-02-17 ENCOUNTER — Encounter: Payer: Self-pay | Admitting: *Deleted

## 2022-02-17 DIAGNOSIS — F1020 Alcohol dependence, uncomplicated: Secondary | ICD-10-CM | POA: Diagnosis not present

## 2022-02-18 ENCOUNTER — Other Ambulatory Visit (HOSPITAL_COMMUNITY): Payer: Self-pay

## 2022-02-18 DIAGNOSIS — F1020 Alcohol dependence, uncomplicated: Secondary | ICD-10-CM | POA: Diagnosis not present

## 2022-02-19 DIAGNOSIS — F1020 Alcohol dependence, uncomplicated: Secondary | ICD-10-CM | POA: Diagnosis not present

## 2022-02-20 DIAGNOSIS — F1020 Alcohol dependence, uncomplicated: Secondary | ICD-10-CM | POA: Diagnosis not present

## 2022-02-21 DIAGNOSIS — F1020 Alcohol dependence, uncomplicated: Secondary | ICD-10-CM | POA: Diagnosis not present

## 2022-02-22 DIAGNOSIS — F1020 Alcohol dependence, uncomplicated: Secondary | ICD-10-CM | POA: Diagnosis not present

## 2022-02-23 DIAGNOSIS — F1020 Alcohol dependence, uncomplicated: Secondary | ICD-10-CM | POA: Diagnosis not present

## 2022-02-24 DIAGNOSIS — F1020 Alcohol dependence, uncomplicated: Secondary | ICD-10-CM | POA: Diagnosis not present

## 2022-02-25 DIAGNOSIS — F1020 Alcohol dependence, uncomplicated: Secondary | ICD-10-CM | POA: Diagnosis not present

## 2022-02-26 DIAGNOSIS — F1020 Alcohol dependence, uncomplicated: Secondary | ICD-10-CM | POA: Diagnosis not present

## 2022-02-27 DIAGNOSIS — F1020 Alcohol dependence, uncomplicated: Secondary | ICD-10-CM | POA: Diagnosis not present

## 2022-02-28 DIAGNOSIS — F1020 Alcohol dependence, uncomplicated: Secondary | ICD-10-CM | POA: Diagnosis not present

## 2022-03-01 DIAGNOSIS — F1020 Alcohol dependence, uncomplicated: Secondary | ICD-10-CM | POA: Diagnosis not present

## 2022-03-02 DIAGNOSIS — F1020 Alcohol dependence, uncomplicated: Secondary | ICD-10-CM | POA: Diagnosis not present

## 2022-03-03 DIAGNOSIS — F1020 Alcohol dependence, uncomplicated: Secondary | ICD-10-CM | POA: Diagnosis not present

## 2022-03-04 DIAGNOSIS — F1020 Alcohol dependence, uncomplicated: Secondary | ICD-10-CM | POA: Diagnosis not present

## 2022-03-05 DIAGNOSIS — F1020 Alcohol dependence, uncomplicated: Secondary | ICD-10-CM | POA: Diagnosis not present

## 2022-03-06 DIAGNOSIS — F1020 Alcohol dependence, uncomplicated: Secondary | ICD-10-CM | POA: Diagnosis not present

## 2022-03-07 DIAGNOSIS — F1020 Alcohol dependence, uncomplicated: Secondary | ICD-10-CM | POA: Diagnosis not present

## 2022-03-08 DIAGNOSIS — F1020 Alcohol dependence, uncomplicated: Secondary | ICD-10-CM | POA: Diagnosis not present

## 2022-03-09 DIAGNOSIS — F1020 Alcohol dependence, uncomplicated: Secondary | ICD-10-CM | POA: Diagnosis not present

## 2022-03-10 DIAGNOSIS — F1020 Alcohol dependence, uncomplicated: Secondary | ICD-10-CM | POA: Diagnosis not present

## 2022-03-11 DIAGNOSIS — F1020 Alcohol dependence, uncomplicated: Secondary | ICD-10-CM | POA: Diagnosis not present

## 2022-03-12 DIAGNOSIS — F1020 Alcohol dependence, uncomplicated: Secondary | ICD-10-CM | POA: Diagnosis not present

## 2022-03-12 DIAGNOSIS — F1010 Alcohol abuse, uncomplicated: Secondary | ICD-10-CM | POA: Diagnosis not present

## 2022-03-12 DIAGNOSIS — F331 Major depressive disorder, recurrent, moderate: Secondary | ICD-10-CM | POA: Diagnosis not present

## 2022-03-12 DIAGNOSIS — F1120 Opioid dependence, uncomplicated: Secondary | ICD-10-CM | POA: Diagnosis not present

## 2022-03-13 DIAGNOSIS — F1020 Alcohol dependence, uncomplicated: Secondary | ICD-10-CM | POA: Diagnosis not present

## 2022-03-13 DIAGNOSIS — F331 Major depressive disorder, recurrent, moderate: Secondary | ICD-10-CM | POA: Diagnosis not present

## 2022-03-16 DIAGNOSIS — F1020 Alcohol dependence, uncomplicated: Secondary | ICD-10-CM | POA: Diagnosis not present

## 2022-03-16 DIAGNOSIS — F331 Major depressive disorder, recurrent, moderate: Secondary | ICD-10-CM | POA: Diagnosis not present

## 2022-03-17 DIAGNOSIS — F1020 Alcohol dependence, uncomplicated: Secondary | ICD-10-CM | POA: Diagnosis not present

## 2022-03-17 DIAGNOSIS — F331 Major depressive disorder, recurrent, moderate: Secondary | ICD-10-CM | POA: Diagnosis not present

## 2022-03-18 DIAGNOSIS — F331 Major depressive disorder, recurrent, moderate: Secondary | ICD-10-CM | POA: Diagnosis not present

## 2022-03-18 DIAGNOSIS — F1020 Alcohol dependence, uncomplicated: Secondary | ICD-10-CM | POA: Diagnosis not present

## 2022-03-19 DIAGNOSIS — F1120 Opioid dependence, uncomplicated: Secondary | ICD-10-CM | POA: Diagnosis not present

## 2022-03-19 DIAGNOSIS — F331 Major depressive disorder, recurrent, moderate: Secondary | ICD-10-CM | POA: Diagnosis not present

## 2022-03-19 DIAGNOSIS — F1010 Alcohol abuse, uncomplicated: Secondary | ICD-10-CM | POA: Diagnosis not present

## 2022-03-19 DIAGNOSIS — F1020 Alcohol dependence, uncomplicated: Secondary | ICD-10-CM | POA: Diagnosis not present

## 2022-03-20 DIAGNOSIS — F1020 Alcohol dependence, uncomplicated: Secondary | ICD-10-CM | POA: Diagnosis not present

## 2022-03-20 DIAGNOSIS — F331 Major depressive disorder, recurrent, moderate: Secondary | ICD-10-CM | POA: Diagnosis not present

## 2022-03-23 DIAGNOSIS — F1020 Alcohol dependence, uncomplicated: Secondary | ICD-10-CM | POA: Diagnosis not present

## 2022-03-23 DIAGNOSIS — F331 Major depressive disorder, recurrent, moderate: Secondary | ICD-10-CM | POA: Diagnosis not present

## 2022-03-24 DIAGNOSIS — E785 Hyperlipidemia, unspecified: Secondary | ICD-10-CM | POA: Diagnosis not present

## 2022-03-24 DIAGNOSIS — F1020 Alcohol dependence, uncomplicated: Secondary | ICD-10-CM | POA: Diagnosis not present

## 2022-03-24 DIAGNOSIS — R251 Tremor, unspecified: Secondary | ICD-10-CM | POA: Diagnosis not present

## 2022-03-24 DIAGNOSIS — Z6828 Body mass index (BMI) 28.0-28.9, adult: Secondary | ICD-10-CM | POA: Diagnosis not present

## 2022-03-24 DIAGNOSIS — F331 Major depressive disorder, recurrent, moderate: Secondary | ICD-10-CM | POA: Diagnosis not present

## 2022-03-24 DIAGNOSIS — G4700 Insomnia, unspecified: Secondary | ICD-10-CM | POA: Diagnosis not present

## 2022-03-24 DIAGNOSIS — F32A Depression, unspecified: Secondary | ICD-10-CM | POA: Diagnosis not present

## 2022-03-24 DIAGNOSIS — Z139 Encounter for screening, unspecified: Secondary | ICD-10-CM | POA: Diagnosis not present

## 2022-03-24 DIAGNOSIS — F1010 Alcohol abuse, uncomplicated: Secondary | ICD-10-CM | POA: Diagnosis not present

## 2022-03-24 DIAGNOSIS — F17210 Nicotine dependence, cigarettes, uncomplicated: Secondary | ICD-10-CM | POA: Diagnosis not present

## 2022-03-24 DIAGNOSIS — M545 Low back pain, unspecified: Secondary | ICD-10-CM | POA: Diagnosis not present

## 2022-03-24 DIAGNOSIS — J449 Chronic obstructive pulmonary disease, unspecified: Secondary | ICD-10-CM | POA: Diagnosis not present

## 2022-03-24 DIAGNOSIS — M8669 Other chronic osteomyelitis, multiple sites: Secondary | ICD-10-CM | POA: Diagnosis not present

## 2022-03-24 DIAGNOSIS — Z125 Encounter for screening for malignant neoplasm of prostate: Secondary | ICD-10-CM | POA: Diagnosis not present

## 2022-03-24 DIAGNOSIS — E119 Type 2 diabetes mellitus without complications: Secondary | ICD-10-CM | POA: Diagnosis not present

## 2022-03-25 DIAGNOSIS — F331 Major depressive disorder, recurrent, moderate: Secondary | ICD-10-CM | POA: Diagnosis not present

## 2022-03-25 DIAGNOSIS — F1020 Alcohol dependence, uncomplicated: Secondary | ICD-10-CM | POA: Diagnosis not present

## 2022-03-26 DIAGNOSIS — F331 Major depressive disorder, recurrent, moderate: Secondary | ICD-10-CM | POA: Diagnosis not present

## 2022-03-26 DIAGNOSIS — F1020 Alcohol dependence, uncomplicated: Secondary | ICD-10-CM | POA: Diagnosis not present

## 2022-03-27 ENCOUNTER — Other Ambulatory Visit (HOSPITAL_COMMUNITY): Payer: Self-pay

## 2022-03-27 DIAGNOSIS — F1010 Alcohol abuse, uncomplicated: Secondary | ICD-10-CM | POA: Diagnosis not present

## 2022-03-27 DIAGNOSIS — F331 Major depressive disorder, recurrent, moderate: Secondary | ICD-10-CM | POA: Diagnosis not present

## 2022-03-27 DIAGNOSIS — F1020 Alcohol dependence, uncomplicated: Secondary | ICD-10-CM | POA: Diagnosis not present

## 2022-03-27 DIAGNOSIS — F1120 Opioid dependence, uncomplicated: Secondary | ICD-10-CM | POA: Diagnosis not present

## 2022-03-30 DIAGNOSIS — F1020 Alcohol dependence, uncomplicated: Secondary | ICD-10-CM | POA: Diagnosis not present

## 2022-03-30 DIAGNOSIS — F331 Major depressive disorder, recurrent, moderate: Secondary | ICD-10-CM | POA: Diagnosis not present

## 2022-03-31 DIAGNOSIS — F331 Major depressive disorder, recurrent, moderate: Secondary | ICD-10-CM | POA: Diagnosis not present

## 2022-03-31 DIAGNOSIS — F1020 Alcohol dependence, uncomplicated: Secondary | ICD-10-CM | POA: Diagnosis not present

## 2022-04-01 DIAGNOSIS — F1010 Alcohol abuse, uncomplicated: Secondary | ICD-10-CM | POA: Diagnosis not present

## 2022-04-01 DIAGNOSIS — F331 Major depressive disorder, recurrent, moderate: Secondary | ICD-10-CM | POA: Diagnosis not present

## 2022-04-01 DIAGNOSIS — F1020 Alcohol dependence, uncomplicated: Secondary | ICD-10-CM | POA: Diagnosis not present

## 2022-04-01 DIAGNOSIS — F1120 Opioid dependence, uncomplicated: Secondary | ICD-10-CM | POA: Diagnosis not present

## 2022-04-02 DIAGNOSIS — F331 Major depressive disorder, recurrent, moderate: Secondary | ICD-10-CM | POA: Diagnosis not present

## 2022-04-02 DIAGNOSIS — F1020 Alcohol dependence, uncomplicated: Secondary | ICD-10-CM | POA: Diagnosis not present

## 2022-04-03 ENCOUNTER — Other Ambulatory Visit (HOSPITAL_COMMUNITY): Payer: Self-pay

## 2022-04-03 DIAGNOSIS — F1020 Alcohol dependence, uncomplicated: Secondary | ICD-10-CM | POA: Diagnosis not present

## 2022-04-03 DIAGNOSIS — F331 Major depressive disorder, recurrent, moderate: Secondary | ICD-10-CM | POA: Diagnosis not present

## 2022-04-06 DIAGNOSIS — F331 Major depressive disorder, recurrent, moderate: Secondary | ICD-10-CM | POA: Diagnosis not present

## 2022-04-06 DIAGNOSIS — F1020 Alcohol dependence, uncomplicated: Secondary | ICD-10-CM | POA: Diagnosis not present

## 2022-04-07 ENCOUNTER — Other Ambulatory Visit (HOSPITAL_COMMUNITY): Payer: Self-pay

## 2022-04-07 DIAGNOSIS — F1020 Alcohol dependence, uncomplicated: Secondary | ICD-10-CM | POA: Diagnosis not present

## 2022-04-07 DIAGNOSIS — F1120 Opioid dependence, uncomplicated: Secondary | ICD-10-CM | POA: Diagnosis not present

## 2022-04-07 DIAGNOSIS — F331 Major depressive disorder, recurrent, moderate: Secondary | ICD-10-CM | POA: Diagnosis not present

## 2022-04-07 DIAGNOSIS — F1010 Alcohol abuse, uncomplicated: Secondary | ICD-10-CM | POA: Diagnosis not present

## 2022-04-08 DIAGNOSIS — F331 Major depressive disorder, recurrent, moderate: Secondary | ICD-10-CM | POA: Diagnosis not present

## 2022-04-08 DIAGNOSIS — F1020 Alcohol dependence, uncomplicated: Secondary | ICD-10-CM | POA: Diagnosis not present

## 2022-04-09 DIAGNOSIS — F1020 Alcohol dependence, uncomplicated: Secondary | ICD-10-CM | POA: Diagnosis not present

## 2022-04-09 DIAGNOSIS — F331 Major depressive disorder, recurrent, moderate: Secondary | ICD-10-CM | POA: Diagnosis not present

## 2022-04-10 DIAGNOSIS — F1020 Alcohol dependence, uncomplicated: Secondary | ICD-10-CM | POA: Diagnosis not present

## 2022-04-10 DIAGNOSIS — F331 Major depressive disorder, recurrent, moderate: Secondary | ICD-10-CM | POA: Diagnosis not present

## 2022-04-12 NOTE — Progress Notes (Deleted)
    SUBJECTIVE:   CHIEF COMPLAINT / HPI:  No chief complaint on file.   T2DM Takes metformin 1000 mg BID, sitagliptin 100 mg, and insulin  PERTINENT  PMH / PSH: ***  Patient Care Team: Salvadore [place], MD as PCP - General (Family Medicine)   OBJECTIVE:   There were no vitals taken for this visit.  Physical Exam      10/31/2021    8:47 AM  Depression screen PHQ 2/9  Decreased Interest 2  Down, Depressed, Hopeless 3  PHQ - 2 Score 5  Altered sleeping 3  Tired, decreased energy 2  Change in appetite 0  Feeling bad or failure about yourself  2  Trouble concentrating 3  Moving slowly or fidgety/restless 0  Suicidal thoughts 0  PHQ-9 Score 15  Difficult doing work/chores Somewhat difficult     {Show previous vital signs (optional):23777}  {Labs  Heme  Chem  Endocrine  Serology  Results Review (optional):23779}  ASSESSMENT/PLAN:   No problem-specific Assessment & Plan notes found for this encounter.    No follow-ups on file.   Zola Button, MD [place]

## 2022-04-13 ENCOUNTER — Telehealth: Payer: Self-pay

## 2022-04-13 ENCOUNTER — Ambulatory Visit: Payer: 59 | Admitting: Family Medicine

## 2022-04-13 ENCOUNTER — Ambulatory Visit: Payer: 59 | Admitting: Infectious Disease

## 2022-04-13 DIAGNOSIS — F1020 Alcohol dependence, uncomplicated: Secondary | ICD-10-CM | POA: Diagnosis not present

## 2022-04-13 DIAGNOSIS — F331 Major depressive disorder, recurrent, moderate: Secondary | ICD-10-CM | POA: Diagnosis not present

## 2022-04-13 NOTE — Telephone Encounter (Signed)
Called patient to see if he would be able to make it to today's appointment, no answer. Left HIPAA compliant voicemail requesting callback.   Donne Robillard D Christpoher Sievers, RN  

## 2022-04-14 DIAGNOSIS — E119 Type 2 diabetes mellitus without complications: Secondary | ICD-10-CM | POA: Diagnosis not present

## 2022-04-14 DIAGNOSIS — Z1331 Encounter for screening for depression: Secondary | ICD-10-CM | POA: Diagnosis not present

## 2022-04-14 DIAGNOSIS — F1020 Alcohol dependence, uncomplicated: Secondary | ICD-10-CM | POA: Diagnosis not present

## 2022-04-14 DIAGNOSIS — Z6828 Body mass index (BMI) 28.0-28.9, adult: Secondary | ICD-10-CM | POA: Diagnosis not present

## 2022-04-14 DIAGNOSIS — Z139 Encounter for screening, unspecified: Secondary | ICD-10-CM | POA: Diagnosis not present

## 2022-04-14 DIAGNOSIS — Z712 Person consulting for explanation of examination or test findings: Secondary | ICD-10-CM | POA: Diagnosis not present

## 2022-04-14 DIAGNOSIS — F331 Major depressive disorder, recurrent, moderate: Secondary | ICD-10-CM | POA: Diagnosis not present

## 2022-04-14 DIAGNOSIS — F17210 Nicotine dependence, cigarettes, uncomplicated: Secondary | ICD-10-CM | POA: Diagnosis not present

## 2022-04-15 DIAGNOSIS — F1010 Alcohol abuse, uncomplicated: Secondary | ICD-10-CM | POA: Diagnosis not present

## 2022-04-15 DIAGNOSIS — F331 Major depressive disorder, recurrent, moderate: Secondary | ICD-10-CM | POA: Diagnosis not present

## 2022-04-15 DIAGNOSIS — F1120 Opioid dependence, uncomplicated: Secondary | ICD-10-CM | POA: Diagnosis not present

## 2022-04-15 DIAGNOSIS — F1020 Alcohol dependence, uncomplicated: Secondary | ICD-10-CM | POA: Diagnosis not present

## 2022-04-16 DIAGNOSIS — F331 Major depressive disorder, recurrent, moderate: Secondary | ICD-10-CM | POA: Diagnosis not present

## 2022-04-16 DIAGNOSIS — F1020 Alcohol dependence, uncomplicated: Secondary | ICD-10-CM | POA: Diagnosis not present

## 2022-04-17 DIAGNOSIS — F1020 Alcohol dependence, uncomplicated: Secondary | ICD-10-CM | POA: Diagnosis not present

## 2022-04-17 DIAGNOSIS — F331 Major depressive disorder, recurrent, moderate: Secondary | ICD-10-CM | POA: Diagnosis not present

## 2022-04-20 DIAGNOSIS — F1020 Alcohol dependence, uncomplicated: Secondary | ICD-10-CM | POA: Diagnosis not present

## 2022-04-20 DIAGNOSIS — F331 Major depressive disorder, recurrent, moderate: Secondary | ICD-10-CM | POA: Diagnosis not present

## 2022-04-21 DIAGNOSIS — F331 Major depressive disorder, recurrent, moderate: Secondary | ICD-10-CM | POA: Diagnosis not present

## 2022-04-21 DIAGNOSIS — F1020 Alcohol dependence, uncomplicated: Secondary | ICD-10-CM | POA: Diagnosis not present

## 2022-04-23 DIAGNOSIS — F1120 Opioid dependence, uncomplicated: Secondary | ICD-10-CM | POA: Diagnosis not present

## 2022-04-23 DIAGNOSIS — F1010 Alcohol abuse, uncomplicated: Secondary | ICD-10-CM | POA: Diagnosis not present

## 2022-04-24 DIAGNOSIS — F1020 Alcohol dependence, uncomplicated: Secondary | ICD-10-CM | POA: Diagnosis not present

## 2022-04-24 DIAGNOSIS — F331 Major depressive disorder, recurrent, moderate: Secondary | ICD-10-CM | POA: Diagnosis not present

## 2022-04-28 DIAGNOSIS — F1010 Alcohol abuse, uncomplicated: Secondary | ICD-10-CM | POA: Diagnosis not present

## 2022-04-28 DIAGNOSIS — F1020 Alcohol dependence, uncomplicated: Secondary | ICD-10-CM | POA: Diagnosis not present

## 2022-04-28 DIAGNOSIS — F331 Major depressive disorder, recurrent, moderate: Secondary | ICD-10-CM | POA: Diagnosis not present

## 2022-04-28 DIAGNOSIS — F1120 Opioid dependence, uncomplicated: Secondary | ICD-10-CM | POA: Diagnosis not present

## 2022-04-29 DIAGNOSIS — F1020 Alcohol dependence, uncomplicated: Secondary | ICD-10-CM | POA: Diagnosis not present

## 2022-04-29 DIAGNOSIS — F331 Major depressive disorder, recurrent, moderate: Secondary | ICD-10-CM | POA: Diagnosis not present

## 2022-05-01 DIAGNOSIS — F1020 Alcohol dependence, uncomplicated: Secondary | ICD-10-CM | POA: Diagnosis not present

## 2022-05-01 DIAGNOSIS — F331 Major depressive disorder, recurrent, moderate: Secondary | ICD-10-CM | POA: Diagnosis not present

## 2022-05-05 DIAGNOSIS — F1010 Alcohol abuse, uncomplicated: Secondary | ICD-10-CM | POA: Diagnosis not present

## 2022-05-05 DIAGNOSIS — F1020 Alcohol dependence, uncomplicated: Secondary | ICD-10-CM | POA: Diagnosis not present

## 2022-05-05 DIAGNOSIS — F331 Major depressive disorder, recurrent, moderate: Secondary | ICD-10-CM | POA: Diagnosis not present

## 2022-05-05 DIAGNOSIS — F1120 Opioid dependence, uncomplicated: Secondary | ICD-10-CM | POA: Diagnosis not present

## 2022-05-06 DIAGNOSIS — F1020 Alcohol dependence, uncomplicated: Secondary | ICD-10-CM | POA: Diagnosis not present

## 2022-05-06 DIAGNOSIS — F331 Major depressive disorder, recurrent, moderate: Secondary | ICD-10-CM | POA: Diagnosis not present

## 2022-05-08 DIAGNOSIS — F1020 Alcohol dependence, uncomplicated: Secondary | ICD-10-CM | POA: Diagnosis not present

## 2022-05-08 DIAGNOSIS — F331 Major depressive disorder, recurrent, moderate: Secondary | ICD-10-CM | POA: Diagnosis not present

## 2022-05-12 DIAGNOSIS — F331 Major depressive disorder, recurrent, moderate: Secondary | ICD-10-CM | POA: Diagnosis not present

## 2022-05-12 DIAGNOSIS — F1010 Alcohol abuse, uncomplicated: Secondary | ICD-10-CM | POA: Diagnosis not present

## 2022-05-12 DIAGNOSIS — F1020 Alcohol dependence, uncomplicated: Secondary | ICD-10-CM | POA: Diagnosis not present

## 2022-05-12 DIAGNOSIS — F1120 Opioid dependence, uncomplicated: Secondary | ICD-10-CM | POA: Diagnosis not present

## 2022-05-13 DIAGNOSIS — F1020 Alcohol dependence, uncomplicated: Secondary | ICD-10-CM | POA: Diagnosis not present

## 2022-05-13 DIAGNOSIS — F331 Major depressive disorder, recurrent, moderate: Secondary | ICD-10-CM | POA: Diagnosis not present

## 2022-05-15 DIAGNOSIS — F1020 Alcohol dependence, uncomplicated: Secondary | ICD-10-CM | POA: Diagnosis not present

## 2022-05-15 DIAGNOSIS — F331 Major depressive disorder, recurrent, moderate: Secondary | ICD-10-CM | POA: Diagnosis not present

## 2022-05-19 DIAGNOSIS — F1010 Alcohol abuse, uncomplicated: Secondary | ICD-10-CM | POA: Diagnosis not present

## 2022-05-19 DIAGNOSIS — F1020 Alcohol dependence, uncomplicated: Secondary | ICD-10-CM | POA: Diagnosis not present

## 2022-05-19 DIAGNOSIS — F331 Major depressive disorder, recurrent, moderate: Secondary | ICD-10-CM | POA: Diagnosis not present

## 2022-05-19 DIAGNOSIS — F1120 Opioid dependence, uncomplicated: Secondary | ICD-10-CM | POA: Diagnosis not present

## 2022-05-22 DIAGNOSIS — F331 Major depressive disorder, recurrent, moderate: Secondary | ICD-10-CM | POA: Diagnosis not present

## 2022-05-22 DIAGNOSIS — F1020 Alcohol dependence, uncomplicated: Secondary | ICD-10-CM | POA: Diagnosis not present

## 2022-05-26 DIAGNOSIS — F1010 Alcohol abuse, uncomplicated: Secondary | ICD-10-CM | POA: Diagnosis not present

## 2022-05-26 DIAGNOSIS — F331 Major depressive disorder, recurrent, moderate: Secondary | ICD-10-CM | POA: Diagnosis not present

## 2022-05-26 DIAGNOSIS — F1020 Alcohol dependence, uncomplicated: Secondary | ICD-10-CM | POA: Diagnosis not present

## 2022-05-26 DIAGNOSIS — F1120 Opioid dependence, uncomplicated: Secondary | ICD-10-CM | POA: Diagnosis not present

## 2022-05-29 DIAGNOSIS — F1020 Alcohol dependence, uncomplicated: Secondary | ICD-10-CM | POA: Diagnosis not present

## 2022-05-29 DIAGNOSIS — F331 Major depressive disorder, recurrent, moderate: Secondary | ICD-10-CM | POA: Diagnosis not present

## 2022-06-02 DIAGNOSIS — F1020 Alcohol dependence, uncomplicated: Secondary | ICD-10-CM | POA: Diagnosis not present

## 2022-06-02 DIAGNOSIS — F331 Major depressive disorder, recurrent, moderate: Secondary | ICD-10-CM | POA: Diagnosis not present

## 2022-06-16 DIAGNOSIS — E785 Hyperlipidemia, unspecified: Secondary | ICD-10-CM | POA: Diagnosis not present

## 2022-06-16 DIAGNOSIS — F419 Anxiety disorder, unspecified: Secondary | ICD-10-CM | POA: Diagnosis not present

## 2022-06-16 DIAGNOSIS — Z139 Encounter for screening, unspecified: Secondary | ICD-10-CM | POA: Diagnosis not present

## 2022-06-16 DIAGNOSIS — E119 Type 2 diabetes mellitus without complications: Secondary | ICD-10-CM | POA: Diagnosis not present

## 2022-07-30 DIAGNOSIS — E1151 Type 2 diabetes mellitus with diabetic peripheral angiopathy without gangrene: Secondary | ICD-10-CM | POA: Diagnosis not present

## 2022-07-30 DIAGNOSIS — E1142 Type 2 diabetes mellitus with diabetic polyneuropathy: Secondary | ICD-10-CM | POA: Diagnosis not present

## 2022-07-30 DIAGNOSIS — Z1159 Encounter for screening for other viral diseases: Secondary | ICD-10-CM | POA: Diagnosis not present

## 2022-07-30 DIAGNOSIS — E1121 Type 2 diabetes mellitus with diabetic nephropathy: Secondary | ICD-10-CM | POA: Diagnosis not present

## 2022-07-30 DIAGNOSIS — E1169 Type 2 diabetes mellitus with other specified complication: Secondary | ICD-10-CM | POA: Diagnosis not present

## 2022-07-30 DIAGNOSIS — Z7984 Long term (current) use of oral hypoglycemic drugs: Secondary | ICD-10-CM | POA: Diagnosis not present

## 2022-07-30 DIAGNOSIS — J4530 Mild persistent asthma, uncomplicated: Secondary | ICD-10-CM | POA: Diagnosis not present

## 2022-07-30 DIAGNOSIS — E11621 Type 2 diabetes mellitus with foot ulcer: Secondary | ICD-10-CM | POA: Diagnosis not present

## 2022-07-30 DIAGNOSIS — E1159 Type 2 diabetes mellitus with other circulatory complications: Secondary | ICD-10-CM | POA: Diagnosis not present

## 2022-08-05 DIAGNOSIS — I739 Peripheral vascular disease, unspecified: Secondary | ICD-10-CM | POA: Diagnosis not present

## 2022-08-05 DIAGNOSIS — I051 Rheumatic mitral insufficiency: Secondary | ICD-10-CM | POA: Diagnosis not present

## 2022-08-05 DIAGNOSIS — E1142 Type 2 diabetes mellitus with diabetic polyneuropathy: Secondary | ICD-10-CM | POA: Diagnosis not present

## 2022-08-05 DIAGNOSIS — E1121 Type 2 diabetes mellitus with diabetic nephropathy: Secondary | ICD-10-CM | POA: Diagnosis not present

## 2022-08-05 DIAGNOSIS — R600 Localized edema: Secondary | ICD-10-CM | POA: Diagnosis not present

## 2022-08-05 DIAGNOSIS — E1169 Type 2 diabetes mellitus with other specified complication: Secondary | ICD-10-CM | POA: Diagnosis not present

## 2022-08-05 DIAGNOSIS — E11621 Type 2 diabetes mellitus with foot ulcer: Secondary | ICD-10-CM | POA: Diagnosis not present

## 2022-08-05 DIAGNOSIS — E1151 Type 2 diabetes mellitus with diabetic peripheral angiopathy without gangrene: Secondary | ICD-10-CM | POA: Diagnosis not present

## 2022-08-05 DIAGNOSIS — E1159 Type 2 diabetes mellitus with other circulatory complications: Secondary | ICD-10-CM | POA: Diagnosis not present

## 2022-08-29 IMAGING — CR DG ABDOMEN 1V
2 series · 2 of 2 positions shown · non-contrast
Comparison: None.

CLINICAL DATA: Pancreatic stent.

EXAM:
ABDOMEN - 1 VIEW

[t abdomen supine (1 of 2)]
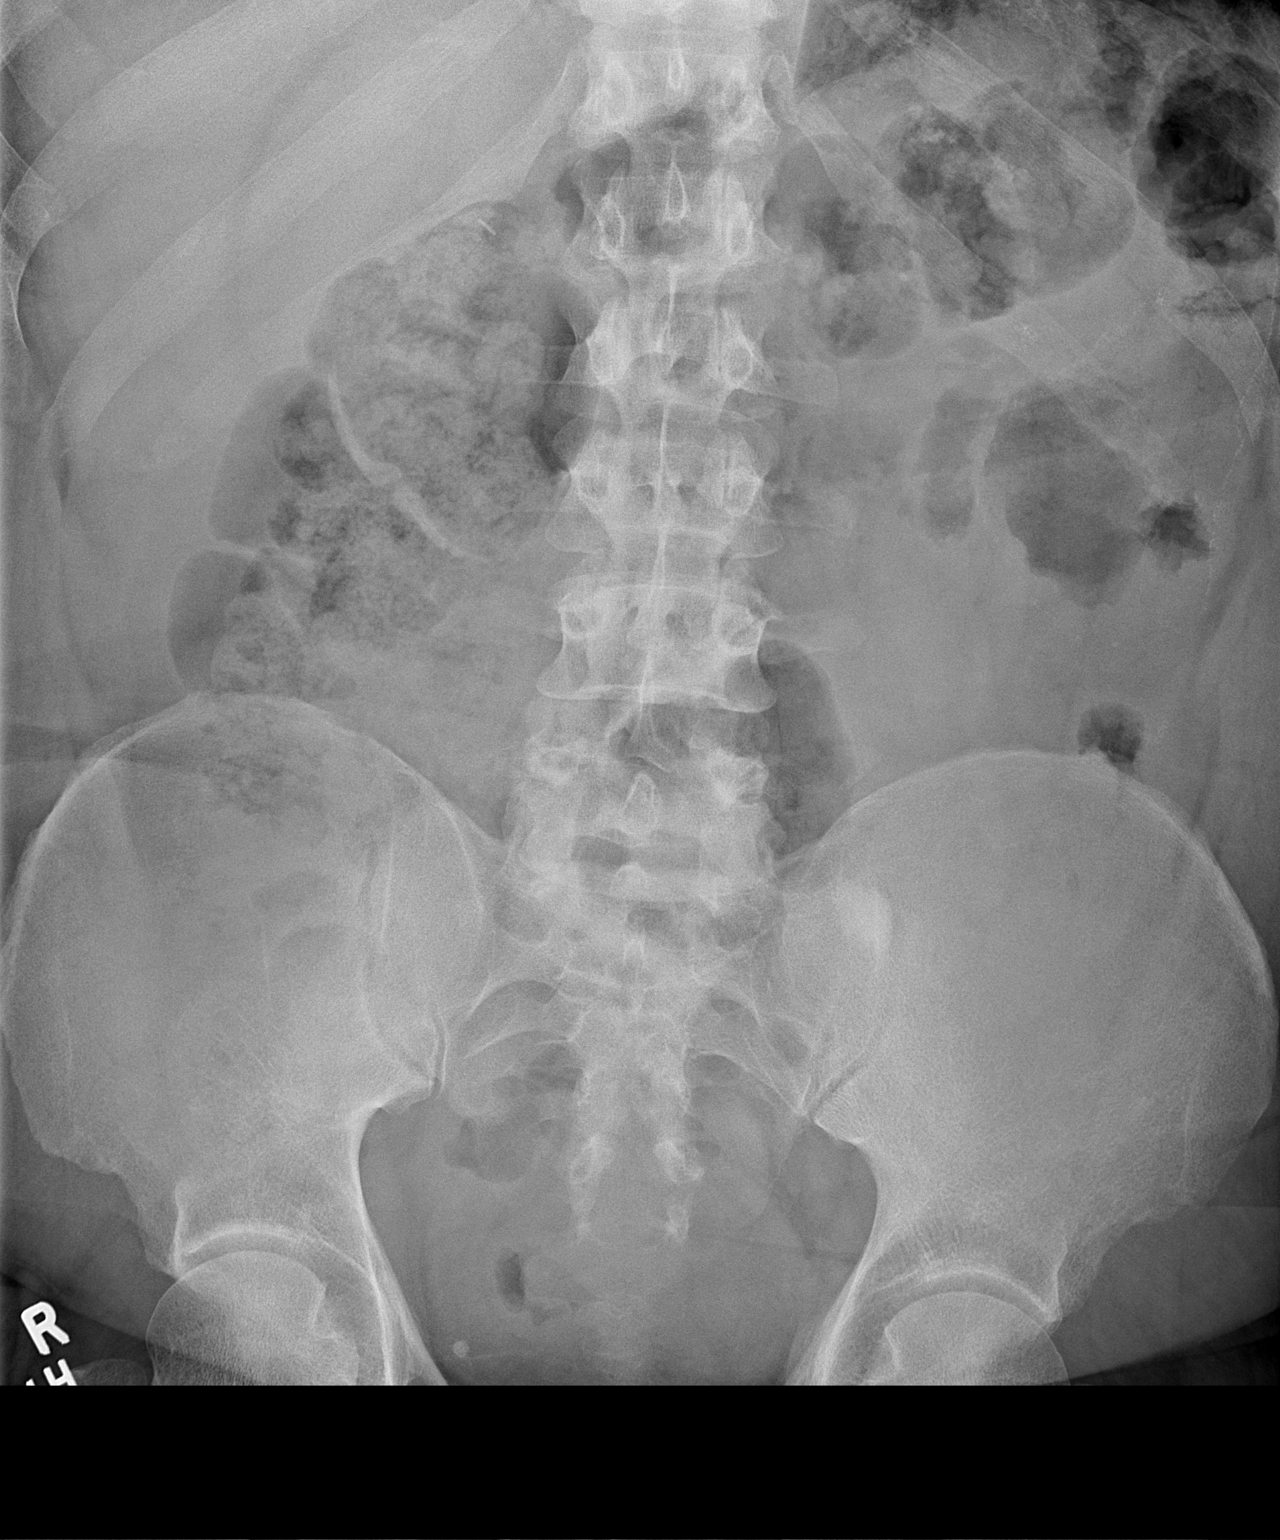

[t abdomen supine (2 of 2)]
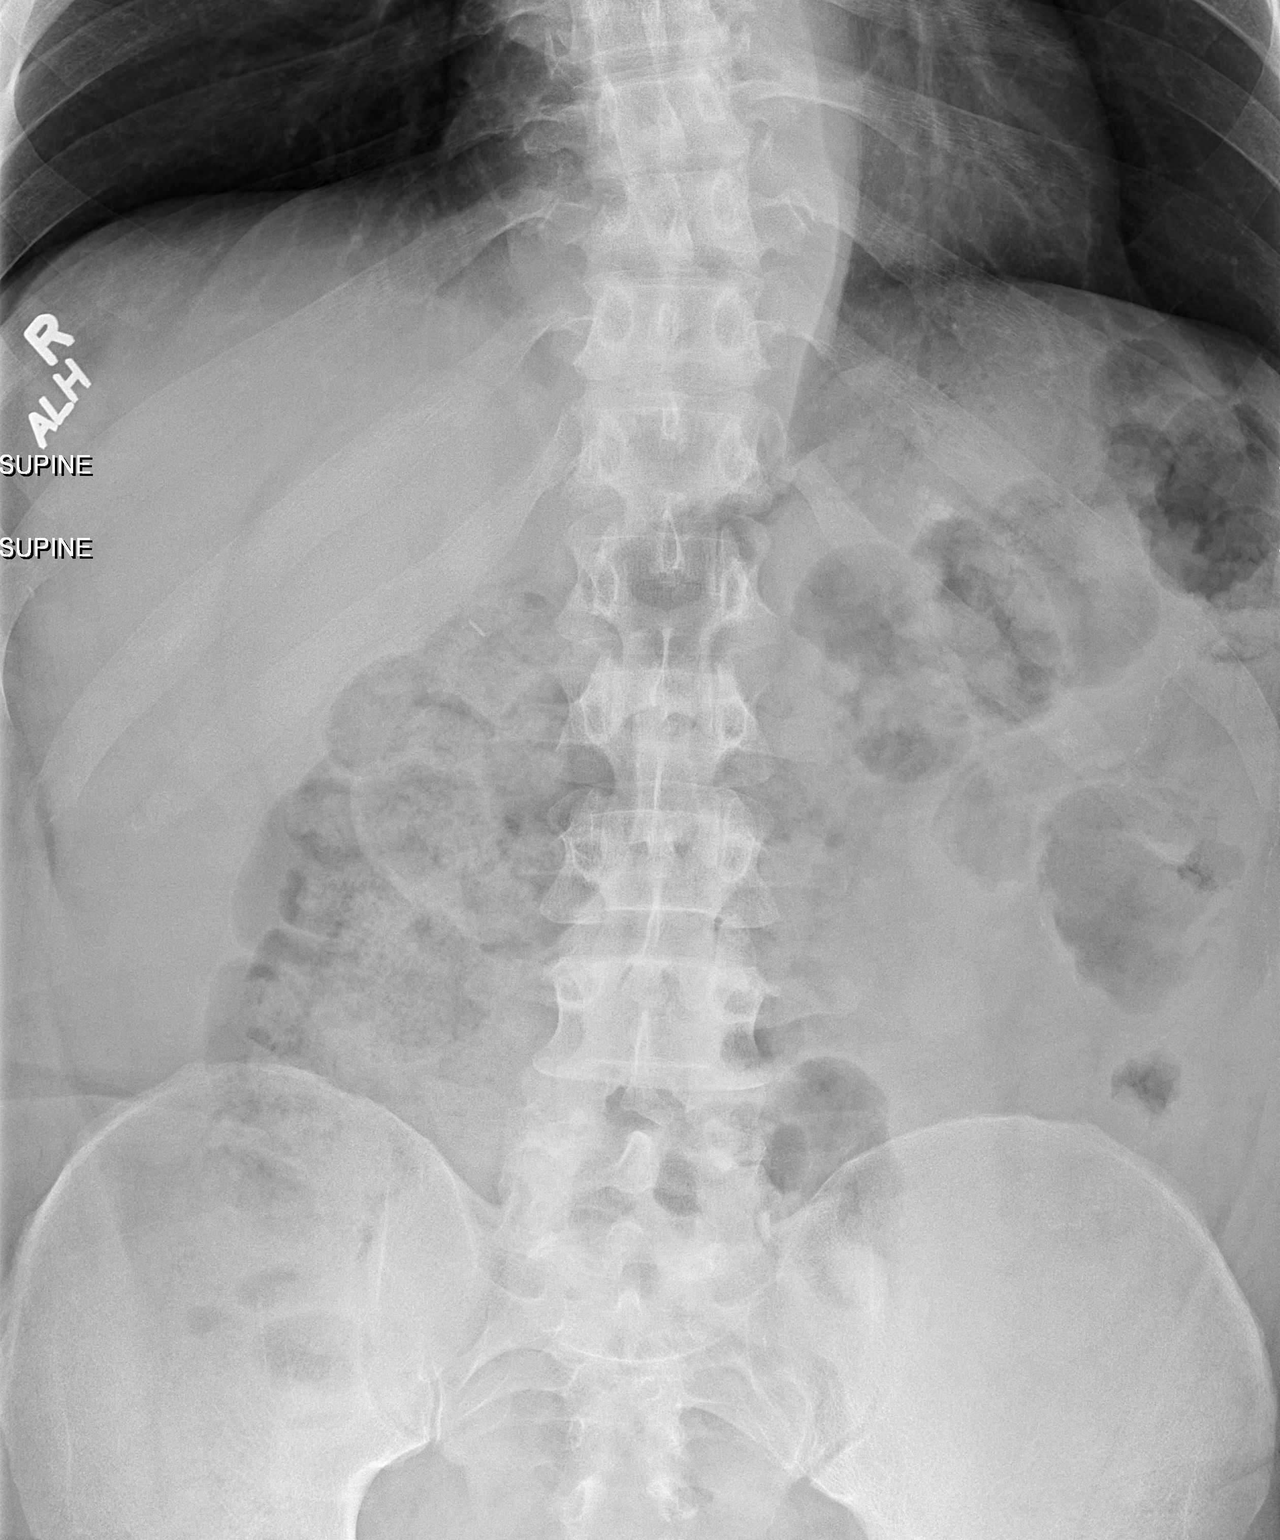

[2 of 2 positions shown; findings below may reference images not displayed]

FINDINGS: Mild fecal loading in the ascending and transverse colon. No
pancreatic stent identified. No free air, portal venous gas, or
pneumatosis. Lung bases are normal. No other abnormalities. A
sclerotic lesion in the left iliac bone is unchanged since October 2020, likely a bone island.
IMPRESSION: 1. No pancreatic duct stent is identified.
2. Fecal loading in the colon as above.

## 2022-09-03 IMAGING — MR MR THORACIC SPINE WO/W CM
7 of 9 series · 31 of 48 positions shown · IV contrast (10 GADAVIST)
Comparison: Prior MRI from 11/30/2020.

CLINICAL DATA: Follow-up examination for epidural abscess.

EXAM:
MRI THORACIC WITHOUT AND WITH CONTRAST
TECHNIQUE: Multiplanar and multiecho pulse sequences of the thoracic spine were
obtained without and with intravenous contrast.
CONTRAST:  10mL GADAVIST GADOBUTROL 1 MMOL/ML IV SOLN

[Series 16: T1 · sagittal · 4.0mm · 1.72mm/px · 2 of 5 slices shown (1 of 3)]
[im 1/5]
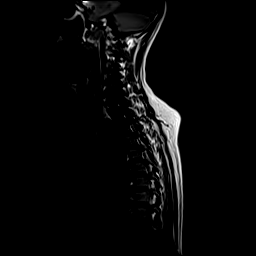
[im 5/5]
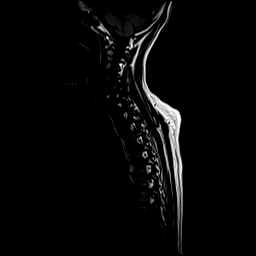

[Series 17: STIR · sagittal · 3.0mm · 1.06mm/px · 3 of 18 slices shown]
[im 1/18]
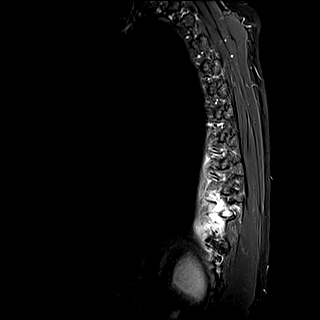
[im 6/18]
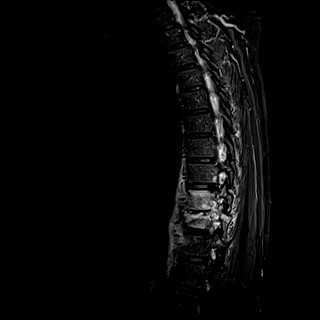
[im 12/18]
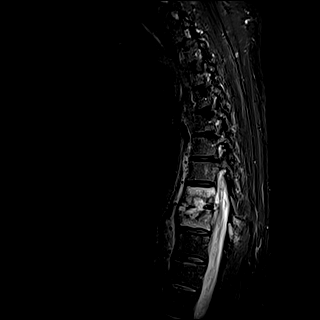

[Series 18: T1 · sagittal · 3.0mm · 1.00mm/px · 4 of 18 slices shown (2 of 3)]
[im 1/18]
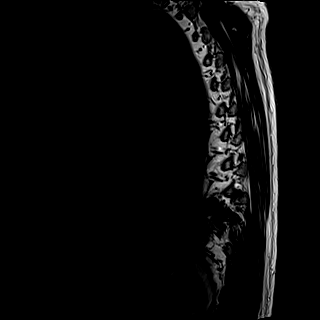
[im 6/18]
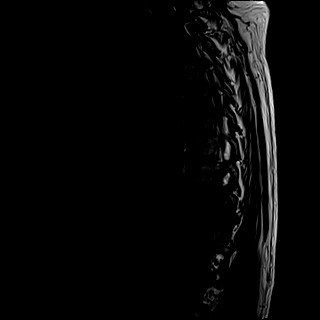
[im 12/18]
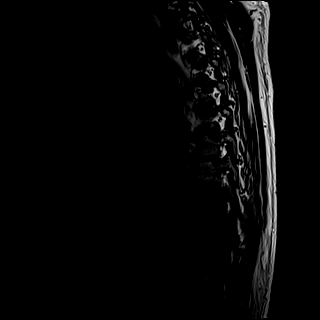
[im 18/18]
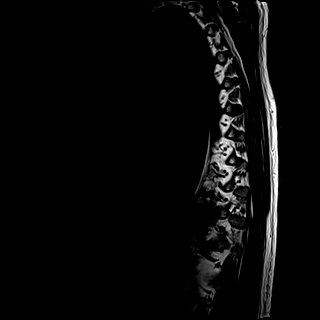

[Series 19: T2 · axial · 4.0mm · 0.78mm/px · z∈[-155,+81]mm · 8 of 46 slices shown]
[im 1/46]
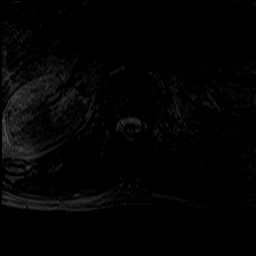
[im 7/46]
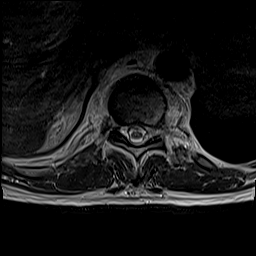
[im 13/46]
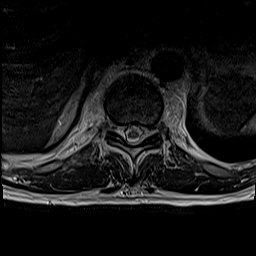
[im 20/46]
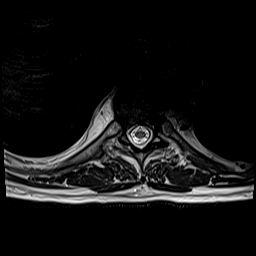
[im 26/46]
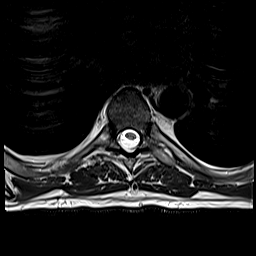
[im 33/46]
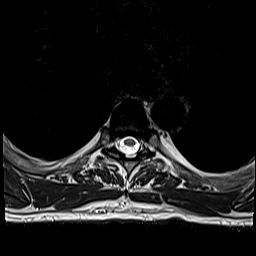
[im 39/46]
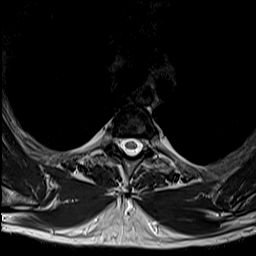
[im 46/46]
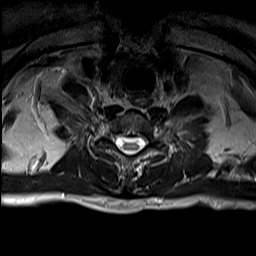

[Series 21: T1 · axial · 4.0mm · 0.39mm/px · z∈[-155,+81]mm · 8 of 46 slices shown (3 of 3)]
[im 1/46]
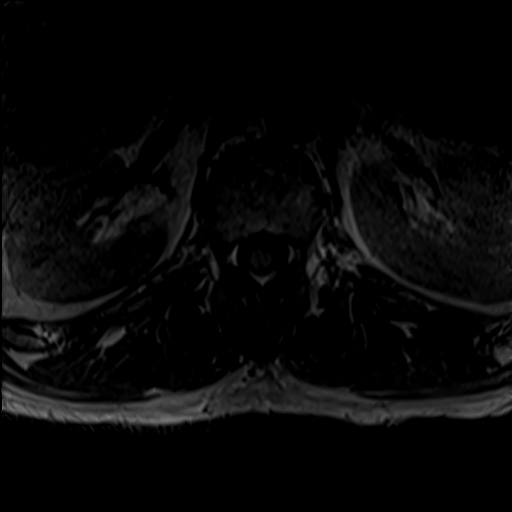
[im 7/46]
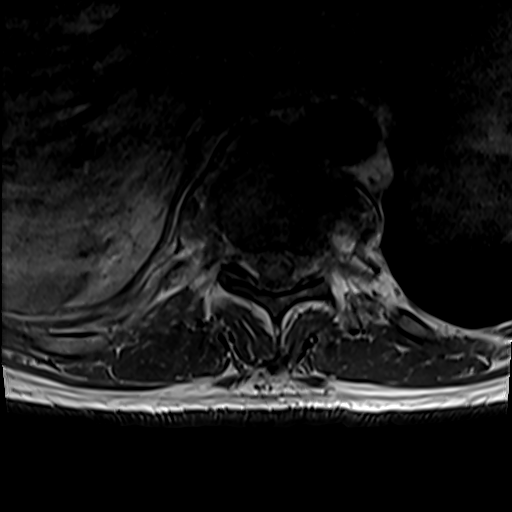
[im 13/46]
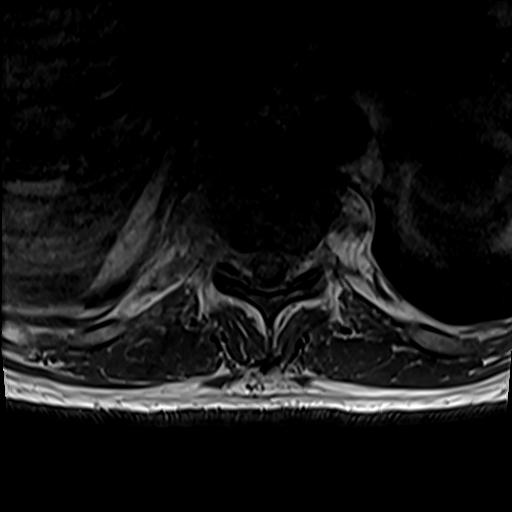
[im 20/46]
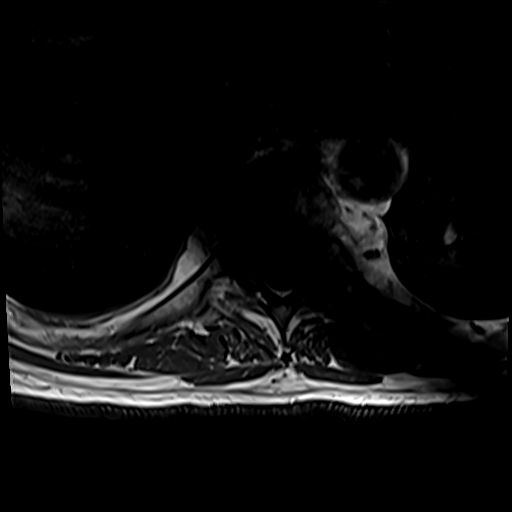
[im 26/46]
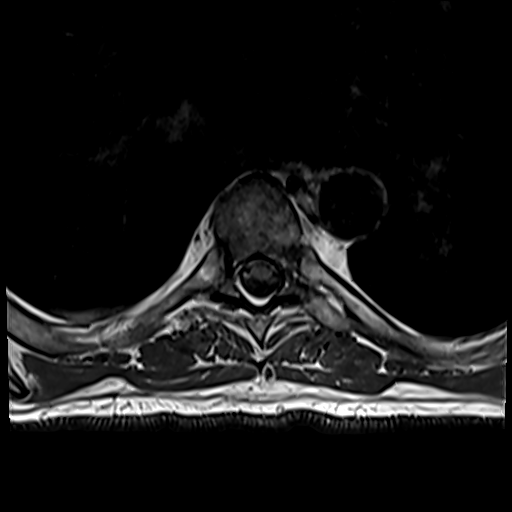
[im 33/46]
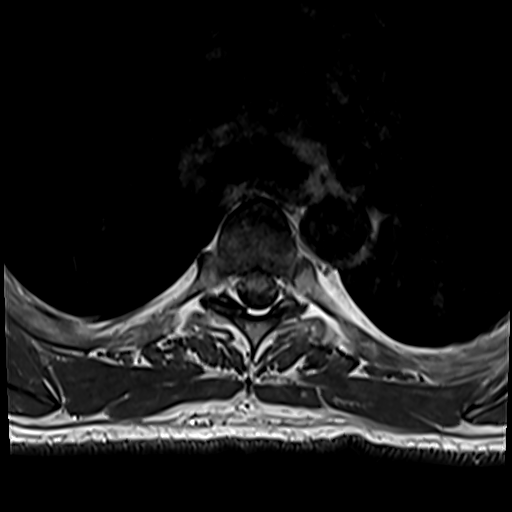
[im 39/46]
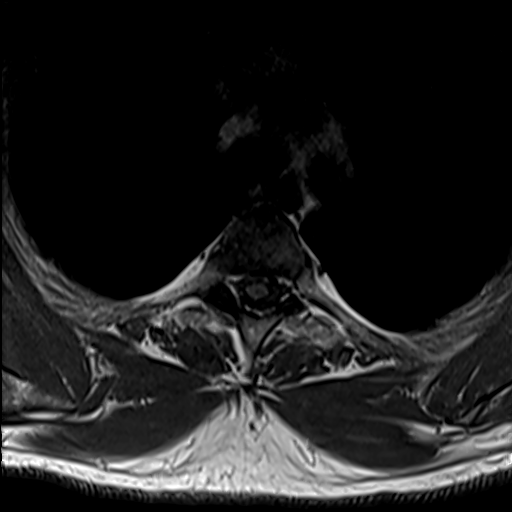
[im 46/46]
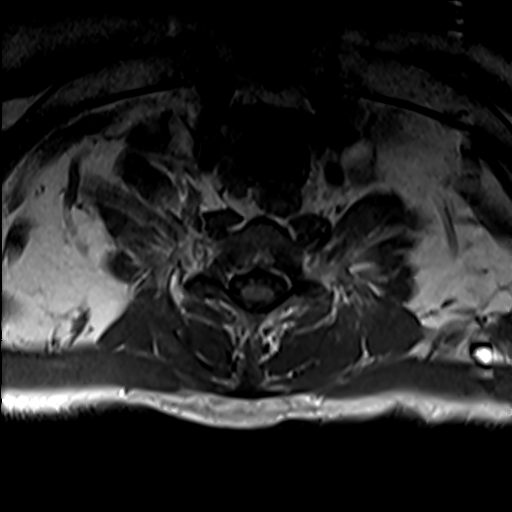

[Series 22: T2 post-contrast · sagittal · 3.0mm · 0.83mm/px · 3 of 18 slices shown]
[im 1/18]
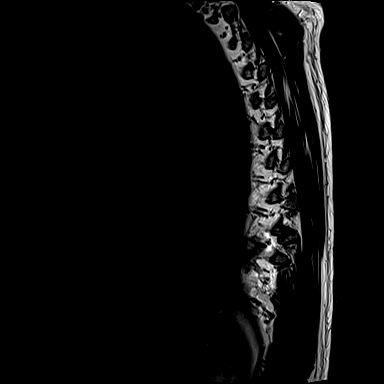
[im 9/18]
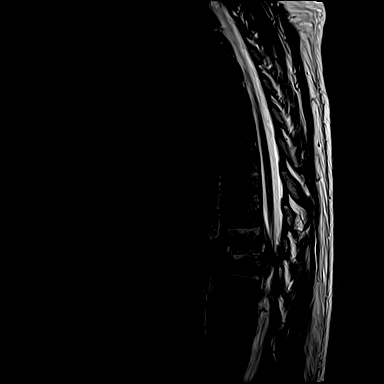
[im 18/18]
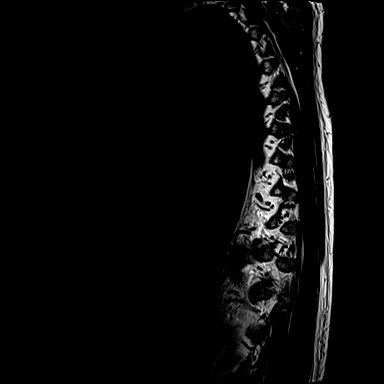

[Series 23: T1 fat-sat post-contrast · sagittal · 3.0mm · 1.00mm/px · 3 of 18 slices shown]
[im 1/18]
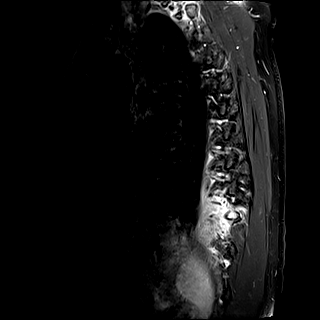
[im 9/18]
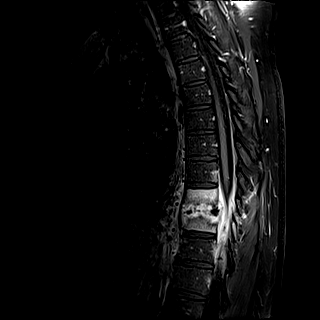
[im 18/18]
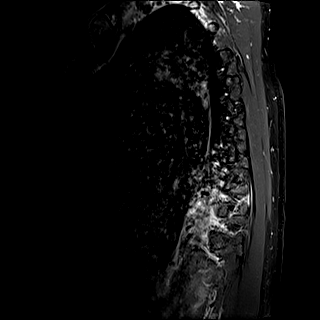

[31 of 48 positions shown; findings below may reference images not displayed]

FINDINGS: Alignment: Stable alignment with preservation of the normal thoracic
kyphosis. Underlying mild dextroscoliosis noted. No interval
listhesis.

Vertebrae: Findings consistent with osteomyelitis discitis again
seen involving the T9-10 interspace. Since previous exam, there has
been progressive endplate irregularity with mild height loss about
the T9 and T10 vertebral bodies. Reactive edema and enhancement
involving the T9 interspace and adjacent vertebral bodies also
appears worsened. Epidural phlegmon/abscess involving the adjacent
thecal sac appears slightly worsened and progressed from previous.
Extension to involve the posterior elements with involvement of the
right worse than left T9-10 facets, consistent with septic
arthritis. This is also worsened in appearance.

At the level of T9-10, this is circumferential in nature involving
both the ventral and dorsal epidural space (series 19, image 33).
Associated mild to moderate spinal stenosis has progressed and
worsened. No frank cord impingement at this time. On sagittal
postcontrast sequences, abnormal epidural enhancement involves the
ventral epidural space extending from the T8 through T11 12 levels
(series 23, image 10). Posteriorly at the dorsal epidural space,
abnormal enhancement is seen extending from approximately T4-5
through the visualized upper thoracic spine (series 23, images
8-11).

Additionally, new subtle postcontrast enhancement is seen involving
the posterior/inferior aspect of the T8 vertebral body (series 23,
image 10) as well as the posterosuperior aspect of the T11 vertebral
body (series 23, image 10), concerning for early changes of
osteomyelitis at these levels as well. This is new from previous.
The intervening T8-9 and T10-11 disc spaces remain relatively normal
at this time.

No other new or distant sites of infection seen elsewhere within the
thoracic spine. Underlying bone marrow signal intensity within
normal limits. No discrete or worrisome osseous lesions.

Cord: Signal intensity within the thoracic spinal cord is within
normal limits. Circumferential epidural abscess at the level of
T9-10 with associated moderate spinal stenosis as above, progressed
from previous.

Paraspinal and other soft tissues: Inflammatory change with
enhancement seen involving the paraspinous soft tissues adjacent to
the T9-10 interspace (series 24, image 38). Overall appearance is
most consistent with phlegmon, with no definite discrete soft tissue
collection or abscess identified. Overall, appearance is mildly
worsened from previous.

Paraspinous soft tissues demonstrate no other acute finding.
Partially visualized lungs are grossly clear. Visualized visceral
structures otherwise unremarkable.

Disc levels:

No other new disc pathology seen within the thoracic spine. No other
stenosis or impingement.
IMPRESSION: 1. Persistent findings of osteomyelitis discitis involving the T9-10
level with involvement of the posterior elements, progressed and
worsened in appearance as compared to 11/30/2020.
2. Associated circumferential epidural phlegmon/abscess at the level
of T9-10, also progressed and worsened now with up to moderate
spinal stenosis at this level.
3. Progressive paraspinous phlegmonous change about the T9-10
interspace. No frank soft tissue abscess or drainable fluid
collection.
4. Mild enhancement within the adjacent T8 and T11 vertebral bodies
as above, also concerning for osteomyelitis. This is new and
progressed from previous.

These results will be called to the ordering clinician or
representative by the Radiologist Assistant, and communication
documented in the PACS or [[id number]].

## 2022-09-13 DIAGNOSIS — Z760 Encounter for issue of repeat prescription: Secondary | ICD-10-CM | POA: Diagnosis not present

## 2022-10-16 IMAGING — CR DG CHEST 2V
2 series · 2 of 2 positions shown · non-contrast
Comparison: Radiograph 01/15/2021.  CT 01/08/2021

CLINICAL DATA: Pleural effusion.

EXAM:
CHEST - 2 VIEW

[w chest pa *]
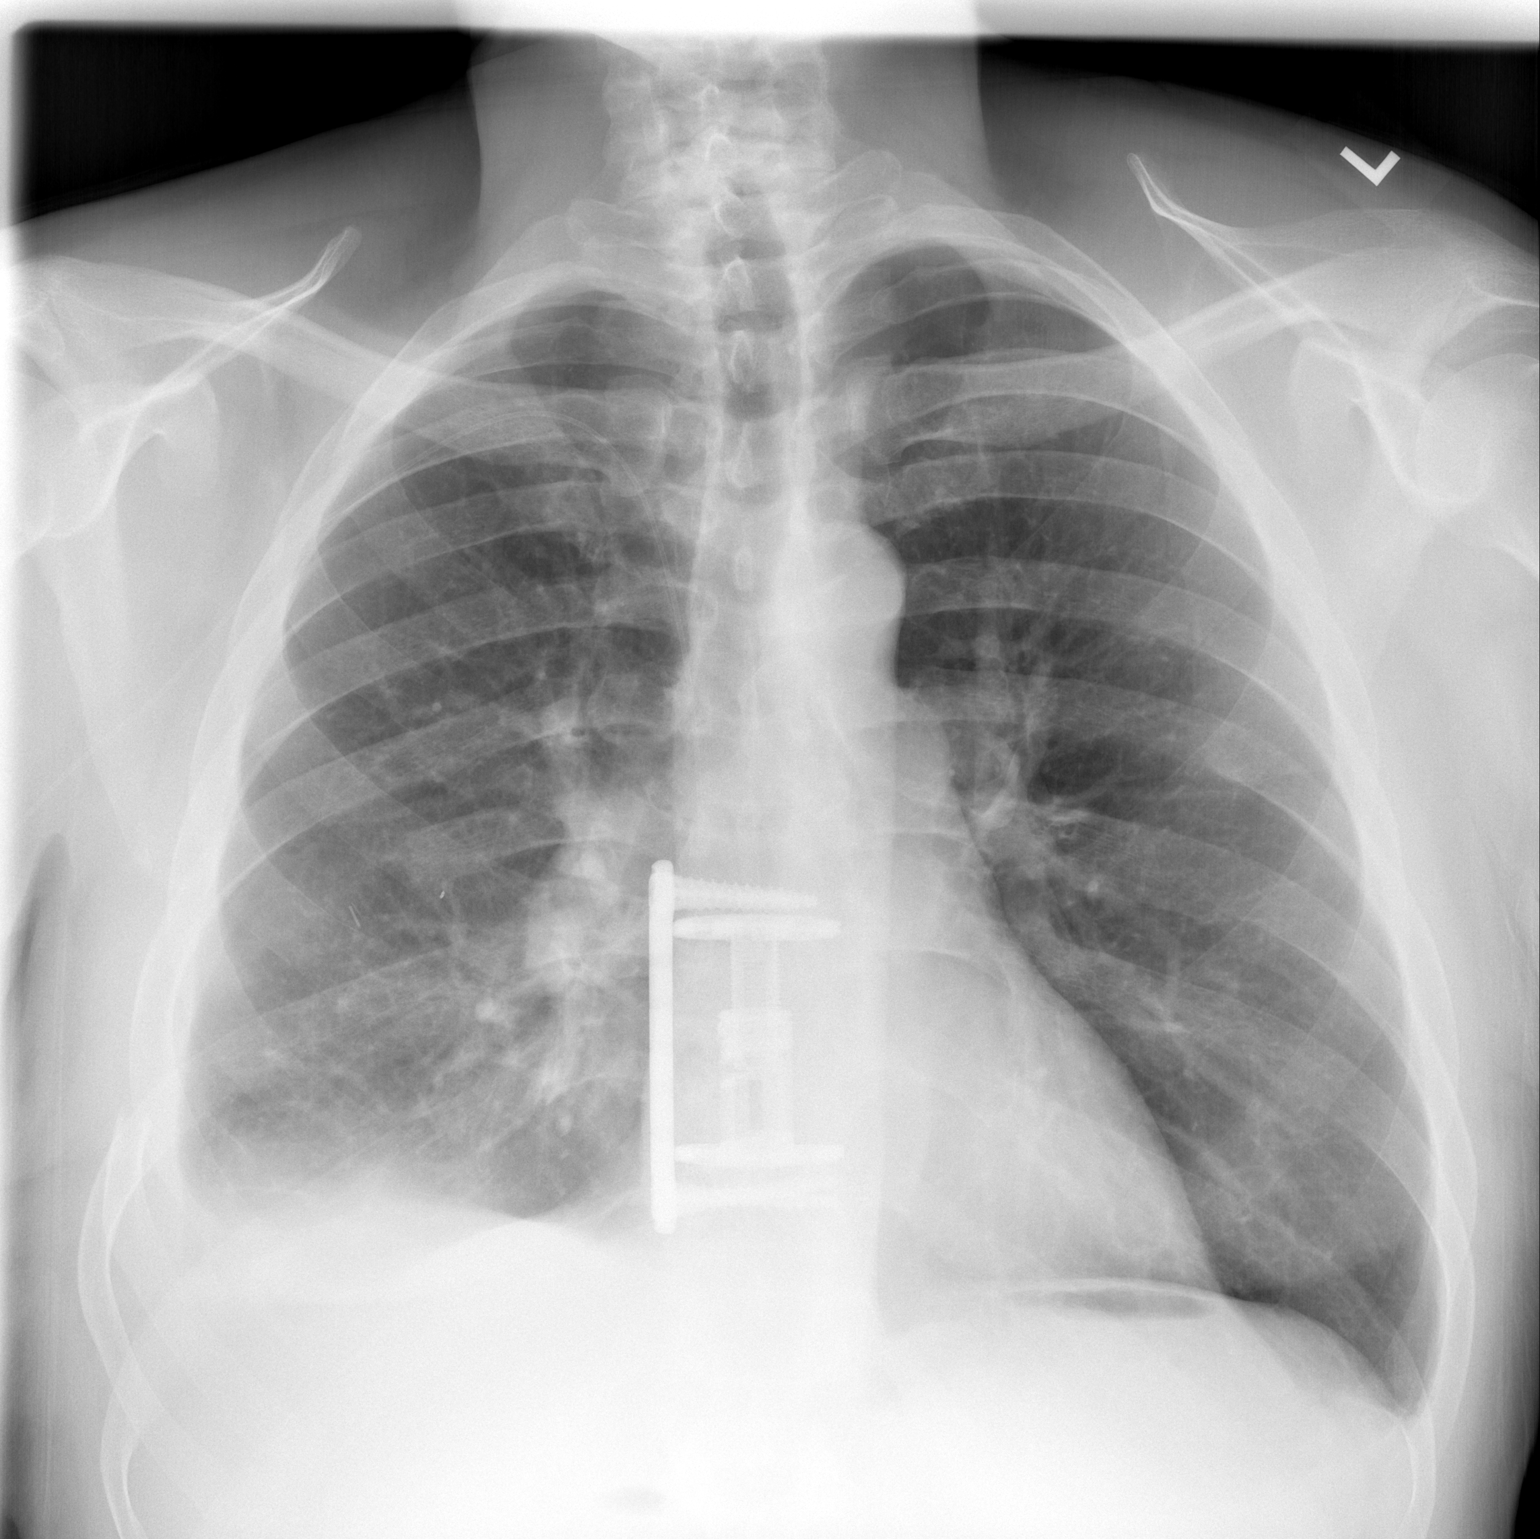

[w chest lat *]
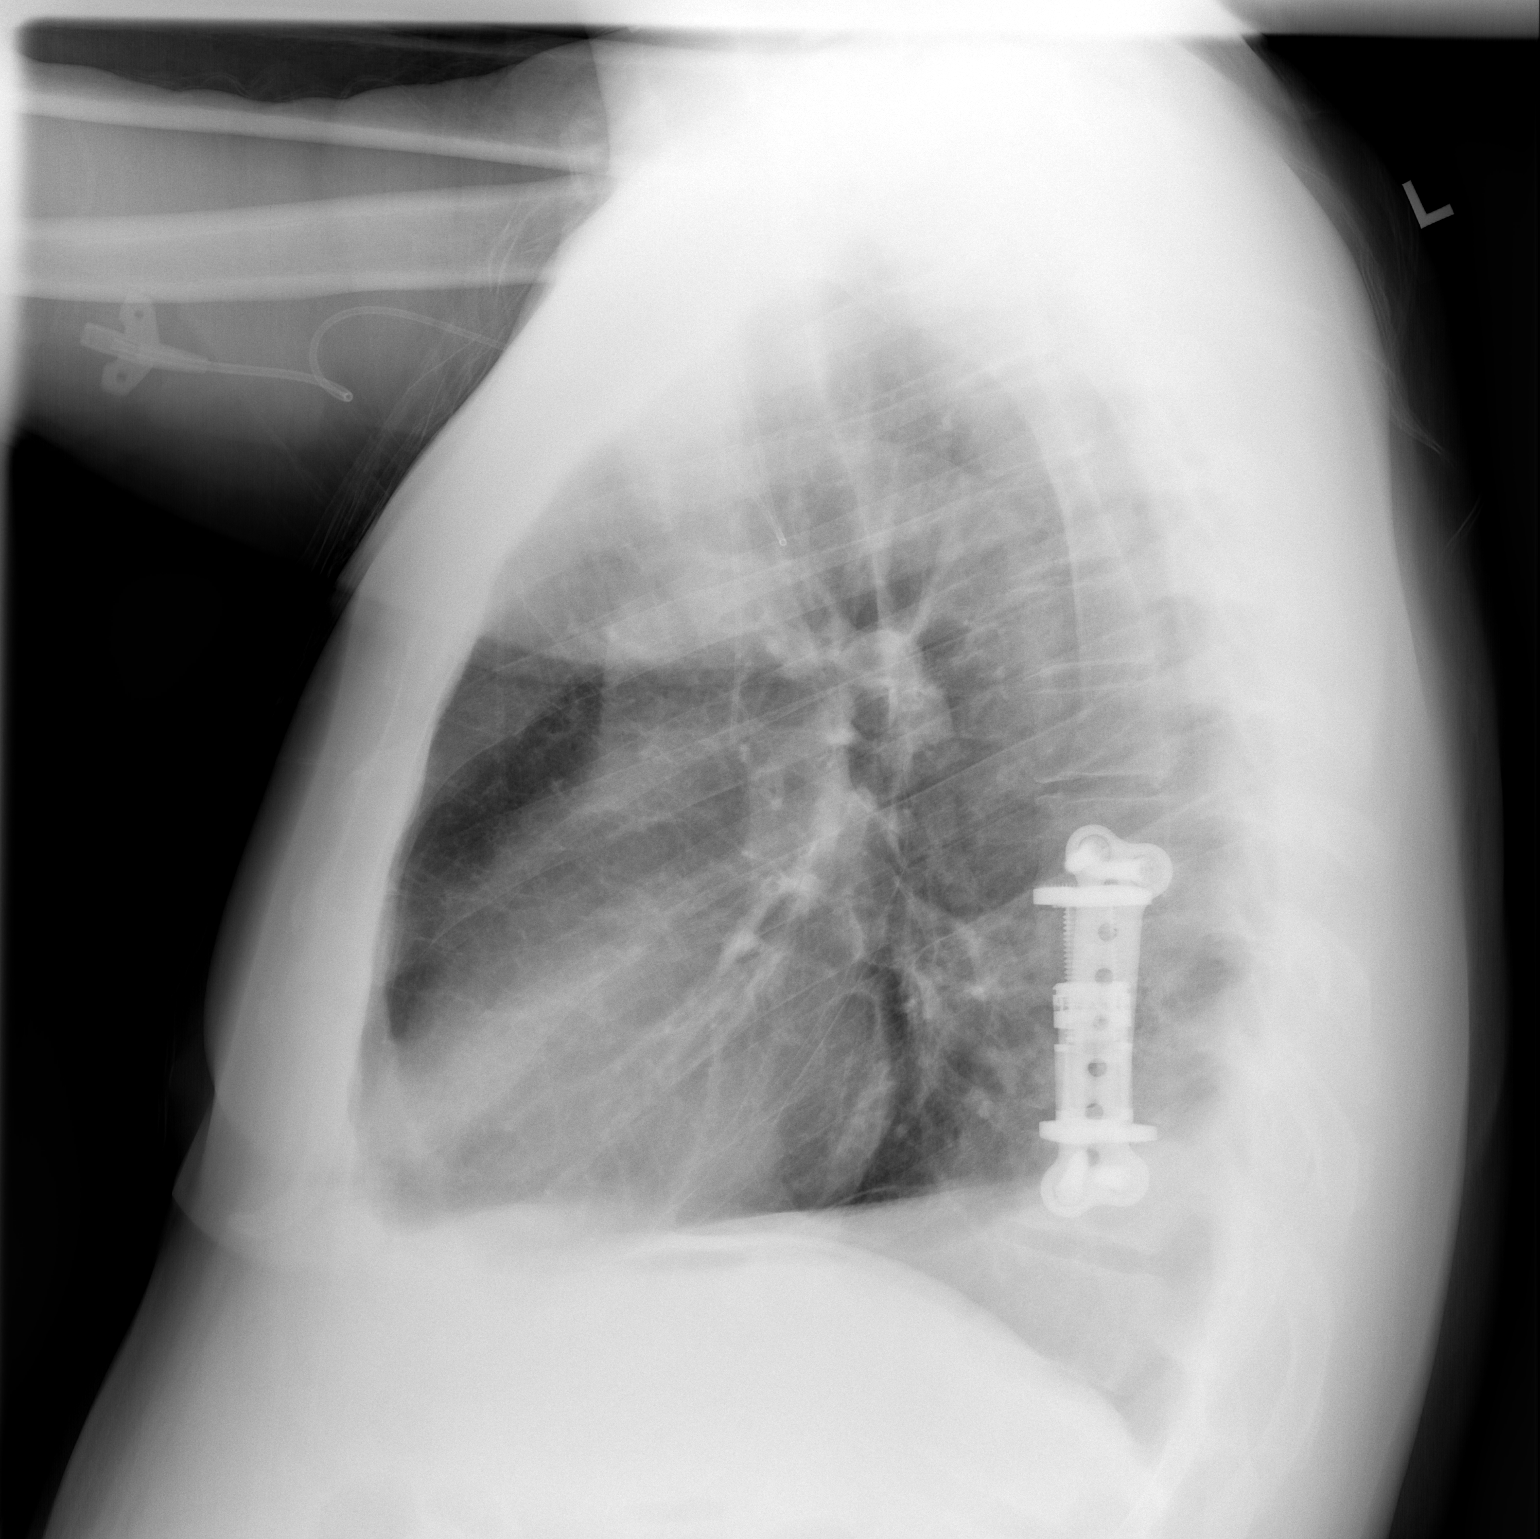

[2 of 2 positions shown; findings below may reference images not displayed]

FINDINGS: Right upper extremity PICC tip in the lower SVC. Small right pleural
effusion is mildly improved from prior exam with decreased loculated
component laterally. Associated ill-defined right basilar opacities
likely atelectasis or scarring. Punctate density or surgical clips
in the posterior right mid lung unchanged. Normal heart size with
stable mediastinal contours. No pulmonary edema. Trace left pleural
effusion. Lower thoracic corpectomy with intact surgical hardware.
IMPRESSION: 1. Mild improvement in small right pleural effusion with decreased
loculated component laterally. Associated right basilar atelectasis
or scarring.
2. Trace left pleural effusion.

## 2022-12-16 DIAGNOSIS — E1159 Type 2 diabetes mellitus with other circulatory complications: Secondary | ICD-10-CM | POA: Diagnosis not present

## 2022-12-16 DIAGNOSIS — Z0001 Encounter for general adult medical examination with abnormal findings: Secondary | ICD-10-CM | POA: Diagnosis not present

## 2022-12-16 DIAGNOSIS — Z13220 Encounter for screening for lipoid disorders: Secondary | ICD-10-CM | POA: Diagnosis not present

## 2022-12-16 DIAGNOSIS — Z794 Long term (current) use of insulin: Secondary | ICD-10-CM | POA: Diagnosis not present

## 2022-12-16 DIAGNOSIS — Z125 Encounter for screening for malignant neoplasm of prostate: Secondary | ICD-10-CM | POA: Diagnosis not present

## 2022-12-30 DIAGNOSIS — D649 Anemia, unspecified: Secondary | ICD-10-CM | POA: Diagnosis not present

## 2023-02-04 DIAGNOSIS — K861 Other chronic pancreatitis: Secondary | ICD-10-CM | POA: Diagnosis not present

## 2023-02-04 DIAGNOSIS — R197 Diarrhea, unspecified: Secondary | ICD-10-CM | POA: Diagnosis not present

## 2023-02-04 DIAGNOSIS — R634 Abnormal weight loss: Secondary | ICD-10-CM | POA: Diagnosis not present

## 2023-11-10 ENCOUNTER — Other Ambulatory Visit (HOSPITAL_COMMUNITY): Payer: Self-pay
